# Patient Record
Sex: Female | Born: 1960
Health system: Southern US, Community
[De-identification: ages and names within clinical notes are randomized; demographics above are authoritative.]

## PROBLEM LIST (undated history)

## (undated) DIAGNOSIS — Z923 Personal history of irradiation: Secondary | ICD-10-CM

## (undated) DIAGNOSIS — Z9221 Personal history of antineoplastic chemotherapy: Secondary | ICD-10-CM

## (undated) DIAGNOSIS — I10 Essential (primary) hypertension: Secondary | ICD-10-CM

## (undated) DIAGNOSIS — F419 Anxiety disorder, unspecified: Secondary | ICD-10-CM

## (undated) DIAGNOSIS — G709 Myoneural disorder, unspecified: Secondary | ICD-10-CM

## (undated) DIAGNOSIS — D72819 Decreased white blood cell count, unspecified: Secondary | ICD-10-CM

## (undated) DIAGNOSIS — K219 Gastro-esophageal reflux disease without esophagitis: Secondary | ICD-10-CM

## (undated) DIAGNOSIS — M1990 Unspecified osteoarthritis, unspecified site: Secondary | ICD-10-CM

## (undated) DIAGNOSIS — R232 Flushing: Secondary | ICD-10-CM

## (undated) DIAGNOSIS — M797 Fibromyalgia: Secondary | ICD-10-CM

## (undated) DIAGNOSIS — G8929 Other chronic pain: Secondary | ICD-10-CM

## (undated) DIAGNOSIS — G43909 Migraine, unspecified, not intractable, without status migrainosus: Secondary | ICD-10-CM

## (undated) DIAGNOSIS — C50911 Malignant neoplasm of unspecified site of right female breast: Secondary | ICD-10-CM

## (undated) DIAGNOSIS — C50919 Malignant neoplasm of unspecified site of unspecified female breast: Secondary | ICD-10-CM

## (undated) DIAGNOSIS — IMO0002 Reserved for concepts with insufficient information to code with codable children: Secondary | ICD-10-CM

## (undated) DIAGNOSIS — M545 Low back pain: Secondary | ICD-10-CM

## (undated) DIAGNOSIS — M329 Systemic lupus erythematosus, unspecified: Secondary | ICD-10-CM

## (undated) HISTORY — DX: Low back pain: M54.5

## (undated) HISTORY — PX: WISDOM TOOTH EXTRACTION: SHX21

## (undated) HISTORY — DX: Essential (primary) hypertension: I10

## (undated) HISTORY — DX: Systemic lupus erythematosus, unspecified: M32.9

## (undated) HISTORY — DX: Other chronic pain: G89.29

## (undated) HISTORY — DX: Reserved for concepts with insufficient information to code with codable children: IMO0002

## (undated) HISTORY — DX: Unspecified osteoarthritis, unspecified site: M19.90

## (undated) HISTORY — DX: Migraine, unspecified, not intractable, without status migrainosus: G43.909

## (undated) HISTORY — DX: Flushing: R23.2

## (undated) HISTORY — DX: Decreased white blood cell count, unspecified: D72.819

## (undated) HISTORY — PX: TUBAL LIGATION: SHX77

## (undated) HISTORY — DX: Fibromyalgia: M79.7

---

## 1996-07-30 HISTORY — PX: CARPAL TUNNEL RELEASE: SHX101

## 1999-03-29 ENCOUNTER — Other Ambulatory Visit: Admission: RE | Admit: 1999-03-29 | Discharge: 1999-03-29 | Payer: Self-pay | Admitting: Obstetrics

## 2000-04-15 ENCOUNTER — Ambulatory Visit (HOSPITAL_COMMUNITY): Admission: RE | Admit: 2000-04-15 | Discharge: 2000-04-15 | Payer: Self-pay | Admitting: Family Medicine

## 2000-04-15 ENCOUNTER — Encounter: Payer: Self-pay | Admitting: Family Medicine

## 2000-10-04 ENCOUNTER — Ambulatory Visit (HOSPITAL_COMMUNITY): Admission: RE | Admit: 2000-10-04 | Discharge: 2000-10-04 | Payer: Self-pay | Admitting: Ophthalmology

## 2000-10-07 ENCOUNTER — Ambulatory Visit (HOSPITAL_COMMUNITY): Admission: RE | Admit: 2000-10-07 | Discharge: 2000-10-07 | Payer: Self-pay | Admitting: Ophthalmology

## 2000-10-24 ENCOUNTER — Other Ambulatory Visit: Admission: RE | Admit: 2000-10-24 | Discharge: 2000-10-24 | Payer: Self-pay | Admitting: Obstetrics

## 2000-10-30 ENCOUNTER — Ambulatory Visit (HOSPITAL_COMMUNITY): Admission: RE | Admit: 2000-10-30 | Discharge: 2000-10-30 | Payer: Self-pay | Admitting: Obstetrics

## 2000-10-30 ENCOUNTER — Encounter: Payer: Self-pay | Admitting: Obstetrics

## 2001-08-01 ENCOUNTER — Ambulatory Visit (HOSPITAL_BASED_OUTPATIENT_CLINIC_OR_DEPARTMENT_OTHER): Admission: RE | Admit: 2001-08-01 | Discharge: 2001-08-01 | Payer: Self-pay | Admitting: Orthopedic Surgery

## 2002-07-30 HISTORY — PX: TRIGGER FINGER RELEASE: SHX641

## 2006-03-14 ENCOUNTER — Encounter: Admission: RE | Admit: 2006-03-14 | Discharge: 2006-03-14 | Payer: Self-pay | Admitting: Family Medicine

## 2006-03-15 ENCOUNTER — Encounter: Admission: RE | Admit: 2006-03-15 | Discharge: 2006-03-15 | Payer: Self-pay | Admitting: Family Medicine

## 2008-09-29 ENCOUNTER — Ambulatory Visit: Payer: Self-pay | Admitting: Oncology

## 2008-10-07 LAB — COMPREHENSIVE METABOLIC PANEL
ALT: 12 U/L (ref 0–35)
AST: 16 U/L (ref 0–37)
Alkaline Phosphatase: 39 U/L (ref 39–117)
BUN: 10 mg/dL (ref 6–23)
Calcium: 9.2 mg/dL (ref 8.4–10.5)
Chloride: 99 mEq/L (ref 96–112)
Creatinine, Ser: 0.95 mg/dL (ref 0.40–1.20)
Total Bilirubin: 1.2 mg/dL (ref 0.3–1.2)

## 2008-10-07 LAB — CBC WITH DIFFERENTIAL/PLATELET
BASO%: 0.3 % (ref 0.0–2.0)
EOS%: 0.5 % (ref 0.0–7.0)
HCT: 37.7 % (ref 34.8–46.6)
HGB: 13.1 g/dL (ref 11.6–15.9)
MCH: 32.3 pg (ref 25.1–34.0)
MCHC: 34.9 g/dL (ref 31.5–36.0)
MONO#: 0.3 10*3/uL (ref 0.1–0.9)
NEUT%: 54.9 % (ref 38.4–76.8)
RDW: 12.4 % (ref 11.2–14.5)
WBC: 3.6 10*3/uL — ABNORMAL LOW (ref 3.9–10.3)
lymph#: 1.3 10*3/uL (ref 0.9–3.3)

## 2008-10-07 LAB — VITAMIN B12: Vitamin B-12: 449 pg/mL (ref 211–911)

## 2008-10-07 LAB — CHCC SMEAR

## 2008-10-07 LAB — MORPHOLOGY: PLT EST: ADEQUATE

## 2008-10-07 LAB — TSH: TSH: 1.189 u[IU]/mL (ref 0.350–4.500)

## 2008-10-11 ENCOUNTER — Ambulatory Visit (HOSPITAL_COMMUNITY): Admission: RE | Admit: 2008-10-11 | Discharge: 2008-10-11 | Payer: Self-pay | Admitting: Obstetrics

## 2008-10-19 ENCOUNTER — Encounter: Admission: RE | Admit: 2008-10-19 | Discharge: 2008-10-19 | Payer: Self-pay | Admitting: Obstetrics

## 2008-12-29 ENCOUNTER — Encounter: Admission: RE | Admit: 2008-12-29 | Discharge: 2008-12-29 | Payer: Self-pay | Admitting: Occupational Medicine

## 2009-01-26 ENCOUNTER — Ambulatory Visit: Payer: Self-pay | Admitting: Oncology

## 2009-02-01 LAB — CBC WITH DIFFERENTIAL/PLATELET
BASO%: 0.8 % (ref 0.0–2.0)
Eosinophils Absolute: 0.1 10*3/uL (ref 0.0–0.5)
MCHC: 36.4 g/dL — ABNORMAL HIGH (ref 31.5–36.0)
MONO#: 0.3 10*3/uL (ref 0.1–0.9)
MONO%: 6.2 % (ref 0.0–14.0)
NEUT#: 2 10*3/uL (ref 1.5–6.5)
RBC: 3.76 10*6/uL (ref 3.70–5.45)
RDW: 11.8 % (ref 11.2–14.5)
WBC: 4.4 10*3/uL (ref 3.9–10.3)

## 2009-02-01 LAB — CHCC SMEAR

## 2009-04-01 ENCOUNTER — Ambulatory Visit: Payer: Self-pay | Admitting: Oncology

## 2009-04-06 LAB — CBC WITH DIFFERENTIAL/PLATELET
BASO%: 0.4 % (ref 0.0–2.0)
EOS%: 0.4 % (ref 0.0–7.0)
MCH: 32.3 pg (ref 25.1–34.0)
MCHC: 36 g/dL (ref 31.5–36.0)
MONO%: 4.2 % (ref 0.0–14.0)
RBC: 4.09 10*6/uL (ref 3.70–5.45)
RDW: 12.4 % (ref 11.2–14.5)
lymph#: 2 10*3/uL (ref 0.9–3.3)

## 2009-04-06 LAB — CHCC SMEAR

## 2009-05-02 ENCOUNTER — Encounter
Admission: RE | Admit: 2009-05-02 | Discharge: 2009-07-20 | Payer: Self-pay | Admitting: Physical Medicine & Rehabilitation

## 2009-05-03 ENCOUNTER — Ambulatory Visit: Payer: Self-pay | Admitting: Physical Medicine & Rehabilitation

## 2009-06-03 ENCOUNTER — Ambulatory Visit: Payer: Self-pay | Admitting: Physical Medicine & Rehabilitation

## 2009-06-17 ENCOUNTER — Ambulatory Visit: Payer: Self-pay | Admitting: Physical Medicine & Rehabilitation

## 2009-06-28 ENCOUNTER — Ambulatory Visit: Payer: Self-pay | Admitting: Oncology

## 2009-06-30 LAB — CBC WITH DIFFERENTIAL/PLATELET
BASO%: 1 % (ref 0.0–2.0)
HCT: 37 % (ref 34.8–46.6)
HGB: 13.1 g/dL (ref 11.6–15.9)
MCHC: 35.4 g/dL (ref 31.5–36.0)
MONO#: 0.3 10*3/uL (ref 0.1–0.9)
NEUT%: 49.3 % (ref 38.4–76.8)
WBC: 3.7 10*3/uL — ABNORMAL LOW (ref 3.9–10.3)
lymph#: 1.5 10*3/uL (ref 0.9–3.3)

## 2009-06-30 LAB — COMPREHENSIVE METABOLIC PANEL
ALT: 14 U/L (ref 0–35)
Albumin: 4.4 g/dL (ref 3.5–5.2)
CO2: 24 mEq/L (ref 19–32)
Calcium: 9.4 mg/dL (ref 8.4–10.5)
Chloride: 101 mEq/L (ref 96–112)
Creatinine, Ser: 0.94 mg/dL (ref 0.40–1.20)
Potassium: 3.7 mEq/L (ref 3.5–5.3)
Total Protein: 7.8 g/dL (ref 6.0–8.3)

## 2009-07-27 ENCOUNTER — Encounter
Admission: RE | Admit: 2009-07-27 | Discharge: 2009-07-28 | Payer: Self-pay | Admitting: Physical Medicine & Rehabilitation

## 2009-07-28 ENCOUNTER — Ambulatory Visit: Payer: Self-pay | Admitting: Physical Medicine & Rehabilitation

## 2009-08-17 ENCOUNTER — Encounter
Admission: RE | Admit: 2009-08-17 | Discharge: 2009-11-15 | Payer: Self-pay | Admitting: Physical Medicine & Rehabilitation

## 2009-08-18 ENCOUNTER — Ambulatory Visit: Payer: Self-pay | Admitting: Physical Medicine & Rehabilitation

## 2009-09-16 ENCOUNTER — Ambulatory Visit: Payer: Self-pay | Admitting: Physical Medicine & Rehabilitation

## 2009-09-30 ENCOUNTER — Ambulatory Visit: Payer: Self-pay | Admitting: Physical Medicine & Rehabilitation

## 2009-10-31 ENCOUNTER — Ambulatory Visit: Payer: Self-pay | Admitting: Physical Medicine & Rehabilitation

## 2009-12-01 ENCOUNTER — Encounter
Admission: RE | Admit: 2009-12-01 | Discharge: 2009-12-05 | Payer: Self-pay | Admitting: Physical Medicine & Rehabilitation

## 2009-12-05 ENCOUNTER — Ambulatory Visit: Payer: Self-pay | Admitting: Physical Medicine & Rehabilitation

## 2009-12-27 ENCOUNTER — Ambulatory Visit: Payer: Self-pay | Admitting: Oncology

## 2009-12-28 LAB — CBC WITH DIFFERENTIAL/PLATELET
Basophils Absolute: 0.1 10*3/uL (ref 0.0–0.1)
Eosinophils Absolute: 0.1 10*3/uL (ref 0.0–0.5)
HGB: 13.6 g/dL (ref 11.6–15.9)
LYMPH%: 43.8 % (ref 14.0–49.7)
MCV: 92.5 fL (ref 79.5–101.0)
MONO#: 0.2 10*3/uL (ref 0.1–0.9)
MONO%: 3.2 % (ref 0.0–14.0)
NEUT#: 2.7 10*3/uL (ref 1.5–6.5)
Platelets: 342 10*3/uL (ref 145–400)
RBC: 3.97 10*6/uL (ref 3.70–5.45)
WBC: 5.5 10*3/uL (ref 3.9–10.3)

## 2009-12-28 LAB — MORPHOLOGY
PLT EST: ADEQUATE
RBC Comments: NORMAL

## 2009-12-28 LAB — CHCC SMEAR

## 2010-04-26 ENCOUNTER — Ambulatory Visit (HOSPITAL_COMMUNITY): Admission: RE | Admit: 2010-04-26 | Discharge: 2010-04-26 | Payer: Self-pay | Admitting: Obstetrics

## 2010-06-26 ENCOUNTER — Ambulatory Visit (HOSPITAL_BASED_OUTPATIENT_CLINIC_OR_DEPARTMENT_OTHER): Payer: Self-pay | Admitting: Oncology

## 2010-06-28 LAB — CBC WITH DIFFERENTIAL/PLATELET
BASO%: 0.7 % (ref 0.0–2.0)
EOS%: 1.3 % (ref 0.0–7.0)
HCT: 33.9 % — ABNORMAL LOW (ref 34.8–46.6)
HGB: 12.2 g/dL (ref 11.6–15.9)
MCH: 33.3 pg (ref 25.1–34.0)
MCHC: 36 g/dL (ref 31.5–36.0)
MONO#: 0.2 10*3/uL (ref 0.1–0.9)
RDW: 12.4 % (ref 11.2–14.5)
WBC: 3.1 10*3/uL — ABNORMAL LOW (ref 3.9–10.3)
lymph#: 1.4 10*3/uL (ref 0.9–3.3)

## 2010-06-28 LAB — COMPREHENSIVE METABOLIC PANEL
ALT: 19 U/L (ref 0–35)
AST: 25 U/L (ref 0–37)
Alkaline Phosphatase: 34 U/L — ABNORMAL LOW (ref 39–117)
BUN: 13 mg/dL (ref 6–23)
Creatinine, Ser: 1.04 mg/dL (ref 0.40–1.20)
Total Bilirubin: 0.6 mg/dL (ref 0.3–1.2)

## 2010-06-28 LAB — MORPHOLOGY: PLT EST: ADEQUATE

## 2010-06-28 LAB — CHCC SMEAR

## 2010-08-20 ENCOUNTER — Encounter: Payer: Self-pay | Admitting: Obstetrics

## 2010-08-23 ENCOUNTER — Encounter
Admission: RE | Admit: 2010-08-23 | Discharge: 2010-08-25 | Payer: Self-pay | Source: Home / Self Care | Attending: Physical Medicine & Rehabilitation | Admitting: Physical Medicine & Rehabilitation

## 2010-08-25 ENCOUNTER — Ambulatory Visit: Admit: 2010-08-25 | Payer: Self-pay | Admitting: Physical Medicine & Rehabilitation

## 2010-09-25 ENCOUNTER — Encounter (HOSPITAL_BASED_OUTPATIENT_CLINIC_OR_DEPARTMENT_OTHER): Payer: BC Managed Care – PPO | Admitting: Oncology

## 2010-09-25 DIAGNOSIS — Z87891 Personal history of nicotine dependence: Secondary | ICD-10-CM

## 2010-09-25 DIAGNOSIS — M1990 Unspecified osteoarthritis, unspecified site: Secondary | ICD-10-CM

## 2010-09-25 DIAGNOSIS — I10 Essential (primary) hypertension: Secondary | ICD-10-CM

## 2010-09-25 DIAGNOSIS — D72819 Decreased white blood cell count, unspecified: Secondary | ICD-10-CM

## 2010-09-25 LAB — CBC WITH DIFFERENTIAL/PLATELET
Basophils Absolute: 0 10*3/uL (ref 0.0–0.1)
EOS%: 0.9 % (ref 0.0–7.0)
HCT: 34.6 % — ABNORMAL LOW (ref 34.8–46.6)
HGB: 12.4 g/dL (ref 11.6–15.9)
LYMPH%: 40.4 % (ref 14.0–49.7)
MCH: 32 pg (ref 25.1–34.0)
MCV: 89.4 fL (ref 79.5–101.0)
MONO%: 6 % (ref 0.0–14.0)
NEUT%: 52.3 % (ref 38.4–76.8)
Platelets: 300 10*3/uL (ref 145–400)
lymph#: 2.3 10*3/uL (ref 0.9–3.3)

## 2010-10-11 ENCOUNTER — Emergency Department (HOSPITAL_COMMUNITY): Payer: BC Managed Care – PPO

## 2010-10-11 ENCOUNTER — Emergency Department (HOSPITAL_COMMUNITY)
Admission: EM | Admit: 2010-10-11 | Discharge: 2010-10-11 | Disposition: A | Discharge status: Home / Self Care | Payer: BC Managed Care – PPO | Attending: Emergency Medicine | Admitting: Emergency Medicine

## 2010-10-11 DIAGNOSIS — I10 Essential (primary) hypertension: Secondary | ICD-10-CM | POA: Insufficient documentation

## 2010-10-11 DIAGNOSIS — R079 Chest pain, unspecified: Secondary | ICD-10-CM | POA: Insufficient documentation

## 2010-10-11 DIAGNOSIS — J40 Bronchitis, not specified as acute or chronic: Secondary | ICD-10-CM | POA: Insufficient documentation

## 2010-10-11 DIAGNOSIS — R05 Cough: Secondary | ICD-10-CM | POA: Insufficient documentation

## 2010-10-11 DIAGNOSIS — R062 Wheezing: Secondary | ICD-10-CM | POA: Insufficient documentation

## 2010-10-11 DIAGNOSIS — R059 Cough, unspecified: Secondary | ICD-10-CM | POA: Insufficient documentation

## 2010-10-11 LAB — POCT I-STAT, CHEM 8
BUN: 11 mg/dL (ref 6–23)
Calcium, Ion: 1.15 mmol/L (ref 1.12–1.32)
Chloride: 102 meq/L (ref 96–112)
Creatinine, Ser: 1.1 mg/dL (ref 0.4–1.2)
Glucose, Bld: 105 mg/dL — ABNORMAL HIGH (ref 70–99)
HCT: 37 % (ref 36.0–46.0)
Hemoglobin: 12.6 g/dL (ref 12.0–15.0)
Potassium: 3.9 mEq/L (ref 3.5–5.1)
Sodium: 138 meq/L (ref 135–145)
TCO2: 25 mmol/L (ref 0–100)

## 2010-10-11 LAB — D-DIMER, QUANTITATIVE: D-Dimer, Quant: <0.22 ug{FEU}/mL (ref 0.00–0.48)

## 2010-12-15 NOTE — Op Note (Signed)
[facility]  Patient:    Tina [name], WHISTLER Visit Number: [id number] MRN: [id number]          Service Type: DSU Location: [facility] Attending Physician:  Ronne Binning Dictated by:   Nicki Reaper, M.D. Proc. Date: 08/01/01 Admit Date:  08/01/2001                             Operative Report  PREOPERATIVE DIAGNOSIS:  Stenosing tenosynovitis right thumb.  POSTOPERATIVE DIAGNOSIS:  Stenosing tenosynovitis right thumb.  OPERATION:  Release A1 pulley right thumb.  SURGEON:  Nicki Reaper, M.D.  ASSISTANT:  R.N.  ANESTHESIA:  Forearm-based IV regional.  ANESTHESIOLOGIST:  Guadalupe Maple, M.D.  INDICATIONS:  The patient is a 50 year old female with a history of triggering of her right thumb, not responsive to conservative treatment.  DESCRIPTION OF PROCEDURE:  The patient was brought to the operating room where a forearm-based IV regional anesthetic was carried out without difficulty. She was prepped and draped using Betadine scrubbing solution with the right arm free.  Transverse incision was made over the A1 pulley, carried down through subcutaneous tissue.  Bleeders were electrocauterized, the neurovascular structures protected.  The A1 pulley was released in its radial aspect.  No further lesions were identified.  The wound was irrigated.  The skin was closed with interrupted 5-0 nylon sutures.  A sterile compressive dressing was applied.  The patient tolerated the procedure well and was taken to the recovery room for observation in satisfactory condition.  She is discharged home to return to the [facility] [place] in one week, on Vicodin and Keflex.Dictated by:   Nicki Reaper, M.D. Attending Physician:  Ronne Binning DD:  08/01/01 TD:  08/01/01 Job: [id number]

## 2010-12-20 ENCOUNTER — Other Ambulatory Visit: Payer: Self-pay | Admitting: Oncology

## 2010-12-20 ENCOUNTER — Encounter (HOSPITAL_BASED_OUTPATIENT_CLINIC_OR_DEPARTMENT_OTHER): Payer: BC Managed Care – PPO | Admitting: Oncology

## 2010-12-20 DIAGNOSIS — D72819 Decreased white blood cell count, unspecified: Secondary | ICD-10-CM

## 2010-12-20 DIAGNOSIS — M1990 Unspecified osteoarthritis, unspecified site: Secondary | ICD-10-CM

## 2010-12-20 DIAGNOSIS — I10 Essential (primary) hypertension: Secondary | ICD-10-CM

## 2010-12-20 DIAGNOSIS — Z87891 Personal history of nicotine dependence: Secondary | ICD-10-CM

## 2010-12-20 DIAGNOSIS — D709 Neutropenia, unspecified: Secondary | ICD-10-CM

## 2010-12-20 LAB — CBC WITH DIFFERENTIAL/PLATELET
Basophils Absolute: 0 10*3/uL (ref 0.0–0.1)
Eosinophils Absolute: 0 10*3/uL (ref 0.0–0.5)
HGB: 12.9 g/dL (ref 11.6–15.9)
LYMPH%: 40 % (ref 14.0–49.7)
MCH: 32.8 pg (ref 25.1–34.0)
MCV: 93.1 fL (ref 79.5–101.0)
MONO%: 3.8 % (ref 0.0–14.0)
NEUT#: 2 10*3/uL (ref 1.5–6.5)
Platelets: 246 10*3/uL (ref 145–400)
RBC: 3.94 10*6/uL (ref 3.70–5.45)

## 2010-12-20 LAB — COMPREHENSIVE METABOLIC PANEL
Albumin: 4.5 g/dL (ref 3.5–5.2)
CO2: 24 mEq/L (ref 19–32)
Calcium: 10 mg/dL (ref 8.4–10.5)
Glucose, Bld: 93 mg/dL (ref 70–99)
Potassium: 3.8 mEq/L (ref 3.5–5.3)
Sodium: 134 mEq/L — ABNORMAL LOW (ref 135–145)
Total Protein: 7.7 g/dL (ref 6.0–8.3)

## 2011-02-16 ENCOUNTER — Encounter: Payer: Worker's Compensation | Attending: Neurosurgery | Admitting: Neurosurgery

## 2011-02-16 ENCOUNTER — Ambulatory Visit: Payer: Self-pay | Admitting: Physical Medicine & Rehabilitation

## 2011-02-16 DIAGNOSIS — IMO0001 Reserved for inherently not codable concepts without codable children: Secondary | ICD-10-CM | POA: Insufficient documentation

## 2011-02-16 DIAGNOSIS — M545 Low back pain, unspecified: Secondary | ICD-10-CM | POA: Insufficient documentation

## 2011-02-16 DIAGNOSIS — R209 Unspecified disturbances of skin sensation: Secondary | ICD-10-CM | POA: Insufficient documentation

## 2011-02-16 DIAGNOSIS — M5430 Sciatica, unspecified side: Secondary | ICD-10-CM

## 2011-02-17 NOTE — Assessment & Plan Note (Signed)
Account [id number].  [name] is a workers' comp patient seen for low back pain.  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  Dr. Wynn Banker is going to make a determination on return to work based on that in January.  She states she still has not gone back to work. Original injury date was April 2010.  She has written a letter with concerns that she wants Dr. Wynn Banker to address.  She states she does wanted to go back to work and I offered to release her back to work without restriction today and then she declined.  She rates her pain as 6 or 7, it is a sharp to dull stabbing pain with paresthesias.  She does note that her right foot "jerks."  General activity level is 7 to 9. Pain is worse in the evening and at night.  Sleep patterns are poor. Bending, sitting most activities aggravate.  Rest, heat therapy, and medication tend to help.  She can walk about 15-20 minutes.  She drives, climbs steps.  She is out of work.  REVIEW OF SYSTEMS:  Notable for those difficulties as well as some night sweats, respiratory infection, sleep apnea, numbness, tingling, trouble with spasms, depression, anxiety, no suicidal thoughts, or aberrant behaviors.  PAST MEDICAL HISTORY:  Unchanged.  SOCIAL HISTORY:  Married, lives with her husband.  FAMILY HISTORY:  Unchanged.  PHYSICAL EXAMINATION:  VITAL SIGNS:  Blood pressure 160/100, pulse 99, respirations 18, O2 sats 99 on room air.  There has been no change in her condition.  Her motor strength is good in lower extremities.  Constitutionally is within normal limits.  She is alert and oriented x3.  She has normal gait.  Sensation is intact.  IMPRESSION:  Lumbar pain.  Chronic back pain, myofascial pain.  PLAN: 1. Refill Flexeril one p.o. nightly #30 with 5 refills. 2. Ultracet one p.o. t.i.d. #90 with 5 refills. 3. She will follow up with Dr. Wynn Banker in 6 months at the maximum.     I am going to provide a letter  that she wrote to him with the chart     and he can make a determination on her work status.     Tina [name] Electronically Signed    RLW/MedQ D:  02/16/2011 13:07:56  T:  02/17/2011 00:58:12  Job #:  [id number]

## 2011-02-19 ENCOUNTER — Ambulatory Visit: Payer: Self-pay | Admitting: Physical Medicine & Rehabilitation

## 2011-03-19 ENCOUNTER — Encounter (HOSPITAL_BASED_OUTPATIENT_CLINIC_OR_DEPARTMENT_OTHER): Payer: BC Managed Care – PPO | Admitting: Oncology

## 2011-03-19 ENCOUNTER — Other Ambulatory Visit: Payer: Self-pay | Admitting: Oncology

## 2011-03-19 DIAGNOSIS — D72819 Decreased white blood cell count, unspecified: Secondary | ICD-10-CM

## 2011-03-19 DIAGNOSIS — Z87891 Personal history of nicotine dependence: Secondary | ICD-10-CM

## 2011-03-19 DIAGNOSIS — M1990 Unspecified osteoarthritis, unspecified site: Secondary | ICD-10-CM

## 2011-03-19 DIAGNOSIS — I10 Essential (primary) hypertension: Secondary | ICD-10-CM

## 2011-03-19 LAB — CBC WITH DIFFERENTIAL/PLATELET
Eosinophils Absolute: 0 10*3/uL (ref 0.0–0.5)
MONO#: 0.2 10*3/uL (ref 0.1–0.9)
MONO%: 6.1 % (ref 0.0–14.0)
NEUT#: 2 10*3/uL (ref 1.5–6.5)
RBC: 3.83 10*6/uL (ref 3.70–5.45)
RDW: 11.8 % (ref 11.2–14.5)
WBC: 3.8 10*3/uL — ABNORMAL LOW (ref 3.9–10.3)
nRBC: 0 % (ref 0–0)

## 2011-04-13 ENCOUNTER — Ambulatory Visit: Payer: Worker's Compensation | Admitting: Physical Medicine & Rehabilitation

## 2011-04-13 ENCOUNTER — Encounter: Payer: Worker's Compensation | Attending: Neurosurgery

## 2011-04-13 DIAGNOSIS — M79609 Pain in unspecified limb: Secondary | ICD-10-CM

## 2011-04-13 DIAGNOSIS — M545 Low back pain, unspecified: Secondary | ICD-10-CM | POA: Insufficient documentation

## 2011-04-13 DIAGNOSIS — M76899 Other specified enthesopathies of unspecified lower limb, excluding foot: Secondary | ICD-10-CM

## 2011-04-13 DIAGNOSIS — IMO0001 Reserved for inherently not codable concepts without codable children: Secondary | ICD-10-CM | POA: Insufficient documentation

## 2011-04-13 DIAGNOSIS — R209 Unspecified disturbances of skin sensation: Secondary | ICD-10-CM | POA: Insufficient documentation

## 2011-04-13 NOTE — Assessment & Plan Note (Signed)
A 50 year old female with a date of injury per work-related incident November 12, 2008.  She was at work at a table that she was leaning upon when it shifted.  The leg of the table fell into a hole in a grating and the patient leaned back to avoid falling forward.  She has had back pain since that time.  She did not actually fall herself.  She was initially treated with ibuprofen, she went to [facility].  She had some prednisone, treated with Mobic, methocarbamol, placed on light duty, working half days initially.  MRI lumbar spine on February 11, 2009, demonstrated small disk protrusion L5-S1.  No neural impingement, went through physical therapy.  __________ was maintained at all other levels, some facet arthropathy noted at L5-S1 bilaterally.  She saw me initial consultation on May 03, 2009.  We did medial branch blocks which were not helpful.  She was treated with nonnarcotic medications, SI joint injection was advised.  She elected not to try this.  She reached MMI on July 28, 2009, after [employer].  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  She had previously stopped physical therapy early on because she states that it made her hurt worse.  She progressed with PT up to 50-pound lifting, went through another FCE, got up to floor-to-waist 55-pound lifting, carrying of 55 pounds, push-pull 50-pound, 8-hour a day, 5 days a week. In April, she states she could not go back because her work required a 70-pound lifting restriction.  She has been seen intermittently because she has been on some nonnarcotic pain medicine such as Flexeril and Ultracet.  She uses Flexeril at night and Ultracet during the day.  She states over the last couple of months, she has developed some increasing right lower extremity pain.  She has had no bowel or bladder dysfunction.  Her pain is worse with walking, bending, and standing; improves with rest and  heat, exercise, and medications.  She can walk 10- 15 minutes at a time.  She drives.  She states she does not climb steps. She indicates she needs some help with meal prep, household duties, and shopping.  REVIEW OF SYSTEMS:  Weakness, numbness, tingling, trouble walking, spasms, depression, anxiety, suicidal thoughts, either more passive thoughts, no active plan, just states that she is frustrated and tired because of her pain, nausea, vomiting, sleep apnea on review of systems.  PHYSICAL EXAMINATION:  VITAL SIGNS:  Blood pressure 160/94, pulse 104, respirations 16, O2 sat 98% on room air. GENERAL:  This is an anxious-appearing woman who cries easily.  She moves slowly around the room when asked to walk or get on and off the exam table.  Her Oswestry score is 82% today compared to 70% last visit, was at 44% in May of 2011. MUSCULOSKELETAL:  She has normal strength in the upper and lower extremities, normal deep tendon reflexes in the lower extremities. Negative straight leg raising test.  To pinprick, she reports decreased sensation in the L3, L4, L5, and S1 dermatomal distribution compared to the left side. BACK:  Has some tenderness over the PSIS as well as lumbar paraspinal area also over the gluteus medius area on the right side.  EXTREMITIES: Show no evidence of edema.  Her gait shows no evidence of toe drag or knee instability, but she does walk slowly.  IMPRESSION:  Myofascial pain syndrome, lumbar spine.  She may also have some sacroiliac disorder.  I see no clear-cut signs of radiculopathy certainly the small disk protrusion that was seen on the MRI would not produce her sensory complaints.  She has not returned to work with the restrictions provided by the [employer].  The patient states that she has been asked to go back to work and I have indicated there is no new finding for me to prevent her from doing so and we would stick by the original FCE.  She indicates she would  like to have another opinion on this and I have taken liberty to make a referral over the [facility] to see if one of the neurosurgeons could evaluate her to see if there is any other explanation for her current complaints.  We will continue her on her Ultracet during the day and Flexeril at night.     Erick Colace, M.D. Electronically Signed    AEK/MedQ D:  04/13/2011 15:00:42  T:  04/13/2011 21:23:55  Job #:  [id number]  cc:   [facility]

## 2011-06-01 ENCOUNTER — Encounter: Payer: Self-pay | Admitting: Oncology

## 2011-06-01 DIAGNOSIS — I10 Essential (primary) hypertension: Secondary | ICD-10-CM | POA: Insufficient documentation

## 2011-06-13 ENCOUNTER — Other Ambulatory Visit: Payer: Self-pay | Admitting: Oncology

## 2011-06-13 ENCOUNTER — Other Ambulatory Visit (HOSPITAL_BASED_OUTPATIENT_CLINIC_OR_DEPARTMENT_OTHER): Payer: Worker's Compensation | Admitting: Lab

## 2011-06-13 ENCOUNTER — Telehealth: Payer: Self-pay | Admitting: Oncology

## 2011-06-13 ENCOUNTER — Ambulatory Visit (HOSPITAL_BASED_OUTPATIENT_CLINIC_OR_DEPARTMENT_OTHER): Payer: Worker's Compensation | Admitting: Oncology

## 2011-06-13 DIAGNOSIS — Z87891 Personal history of nicotine dependence: Secondary | ICD-10-CM

## 2011-06-13 DIAGNOSIS — M1990 Unspecified osteoarthritis, unspecified site: Secondary | ICD-10-CM

## 2011-06-13 DIAGNOSIS — I10 Essential (primary) hypertension: Secondary | ICD-10-CM

## 2011-06-13 DIAGNOSIS — D72819 Decreased white blood cell count, unspecified: Secondary | ICD-10-CM

## 2011-06-13 LAB — COMPREHENSIVE METABOLIC PANEL
ALT: 16 U/L (ref 0–35)
AST: 20 U/L (ref 0–37)
CO2: 24 mEq/L (ref 19–32)
Creatinine, Ser: 0.9 mg/dL (ref 0.50–1.10)
Sodium: 135 mEq/L (ref 135–145)
Total Bilirubin: 0.8 mg/dL (ref 0.3–1.2)
Total Protein: 7.5 g/dL (ref 6.0–8.3)

## 2011-06-13 LAB — CBC WITH DIFFERENTIAL/PLATELET
BASO%: 0.8 % (ref 0.0–2.0)
Basophils Absolute: 0 10*3/uL (ref 0.0–0.1)
EOS%: 1.1 % (ref 0.0–7.0)
HGB: 12.9 g/dL (ref 11.6–15.9)
MCH: 33.1 pg (ref 25.1–34.0)
MCHC: 35.6 g/dL (ref 31.5–36.0)
MCV: 93 fL (ref 79.5–101.0)
MONO%: 6.4 % (ref 0.0–14.0)
NEUT%: 51.5 % (ref 38.4–76.8)
RDW: 12.5 % (ref 11.2–14.5)
lymph#: 1.6 10*3/uL (ref 0.9–3.3)

## 2011-06-13 LAB — MORPHOLOGY: PLT EST: ADEQUATE

## 2011-06-13 LAB — CHCC SMEAR

## 2011-06-13 NOTE — Progress Notes (Signed)
[facility] OFFICE PROGRESS NOTE  CC:  Tina [name], M.D.  DIAGNOSIS:  leucopenia; NOS.  TREATMENT:  Watchful observation.  INTERVAL HISTORY: Tina [name] 50 y.o. female returns for regular follow.  She is feeling well. She has mild fatigue is been on for many years. She is still able to take a mother and her grandmother who is 67 years old at home. She denies any recurrent infection such as  pneumonia with productive cough, chest pain, abdominal pain, diarrhea, hematuria, year depression, much infection, skin rash, cellulitis, abscess, recurrent lymph node swelling.  She has not had recurrent sinus infection for a while.   MEDICAL HISTORY: Past Medical History  Diagnosis Date  . Leukocytopenia   . Hypertension   . Degenerative joint disease     ALLERGIES:   has no known allergies.  MEDICATIONS: Current Outpatient Prescriptions  Medication Sig Dispense Refill  . Ascorbic Acid (VITAMIN C) 1000 MG tablet Take 1,000 mg by mouth daily.        Marland Kitchen ibuprofen (ADVIL,MOTRIN) 200 MG tablet Take 200 mg by mouth as needed.        . Multiple Vitamin (MULTIVITAMIN) tablet Take 1 tablet by mouth daily.        Marland Kitchen olmesartan (BENICAR) 40 MG tablet Take 40 mg by mouth daily.        . Vitamin D, Ergocalciferol, (DRISDOL) 50000 UNITS CAPS Take 1,000 Units by mouth daily.         SURGICAL HISTORY: No past surgical history on file.  REVIEW OF SYSTEMS:  Pertinent items are noted in HPI.   Filed Vitals:   06/13/11 1354  BP: 155/101  Pulse: 86  Temp: 98.5 F (36.9 C)   Wt Readings from Last 3 Encounters:  06/13/11 189 lb 11.2 oz (86.047 kg)  12/20/10 189 lb 6 oz (85.9 kg)    PHYSICAL EXAMINATION:  General:  well-nourished in no acute distress.  Eyes:  no scleral icterus.  ENT:  There were no oropharyngeal lesions.  Neck was without thyromegaly.  Lymphatics:  Negative cervical, supraclavicular or axillary adenopathy.  Respiratory: lungs were clear bilaterally without  wheezing or crackles.  Cardiovascular:  Regular rate and rhythm, S1/S2, without murmur, rub or gallop.  There was no pedal edema.  GI:  abdomen was soft, flat, nontender, nondistended, without organomegaly.  Muscoloskeletal:  no spinal tenderness of palpation of vertebral spine.  Skin exam was without echymosis, petichae.  Neuro exam was nonfocal.  Patient was able to get on and off exam table without assistance.  Gait was normal.  Patient was alerted and oriented.  Attention was good.   Language was appropriate.  Mood was normal without depression.  Speech was not pressured.  Thought content was not tangential.    LABORATORY/RADIOLOGY DATA:  Lab Results  Component Value Date   WBC 4.0 06/13/2011   HGB 12.9 06/13/2011   HCT 36.3 06/13/2011   PLT 282 06/13/2011   GLUCOSE 93 12/20/2010   GLUCOSE 93 12/20/2010   ALT 14 12/20/2010   ALT 14 12/20/2010   AST 21 12/20/2010   AST 21 12/20/2010   NA 134* 12/20/2010   NA 134* 12/20/2010   K 3.8 12/20/2010   K 3.8 12/20/2010   CL 100 12/20/2010   CL 100 12/20/2010   CREATININE 0.92 12/20/2010   CREATININE 0.92 12/20/2010   BUN 11 12/20/2010   BUN 11 12/20/2010   CO2 24 12/20/2010   CO2 24 12/20/2010   TSH 1.189 10/07/2008  ASSESSMENT AND PLAN:   1. Intermittent leukopenia and neutropenia:  I discussed with Ms Tina [name] that it is most likely this is a benign condition.  Today, her WBC and ANC are normal.   I discussed with her again that I have a very low clinical concern for a primary bone marrow process such as leukemia or lymphoma, aplasia, as other cell lines are within normal range and she has no B symptoms.  In the future, if she has recurrent neutropenia less than 1 or pancytopenia with B symptoms, I may consider a diagnostic bone marrow biopsy.   2. Recurrent sinusitis:  Has not happened for a while.  3. Primary care:  She will have routine screen mammogram this month with the breast Center. She'll receive a flu shot this year. She is up-to-date with  her screening Pap smear under this year. She is due to have a routine screen colonoscopy in about 2 years when she turns 52. 4. Follow up:  Lab only in 4 months and 8 months. I will see her myself in one year. I advised her to contact us if she has problem such as recurrent infection or B-symptoms.

## 2011-06-13 NOTE — Telephone Encounter (Signed)
gve the pt her march,july,nov 2013 appt calendars.

## 2011-08-21 ENCOUNTER — Ambulatory Visit: Payer: Self-pay | Admitting: Physical Medicine & Rehabilitation

## 2011-10-08 ENCOUNTER — Other Ambulatory Visit (HOSPITAL_BASED_OUTPATIENT_CLINIC_OR_DEPARTMENT_OTHER): Payer: Self-pay | Admitting: Lab

## 2011-10-08 DIAGNOSIS — D72819 Decreased white blood cell count, unspecified: Secondary | ICD-10-CM

## 2011-10-08 LAB — CBC WITH DIFFERENTIAL/PLATELET
BASO%: 0.8 % (ref 0.0–2.0)
EOS%: 0.8 % (ref 0.0–7.0)
HCT: 39.5 % (ref 34.8–46.6)
LYMPH%: 48.4 % (ref 14.0–49.7)
MCH: 32.1 pg (ref 25.1–34.0)
MCHC: 34.6 g/dL (ref 31.5–36.0)
NEUT%: 42.2 % (ref 38.4–76.8)
Platelets: 268 10*3/uL (ref 145–400)

## 2011-10-10 ENCOUNTER — Telehealth: Payer: Self-pay | Admitting: *Deleted

## 2011-10-10 NOTE — Telephone Encounter (Signed)
Message copied by Wende Mott on Wed Oct 10, 2011 10:36 AM ------      Message from: Jethro Bolus T      Created: Tue Oct 09, 2011  4:06 PM       Please call pt.  Her WBC and neutrophil are slightly lower than before, but again, no anemia or low plt.  I recommend again watchful observation.  In the future, if counts are much worse, we may consider bone marrow biopsy at that time.  Thanks.

## 2011-10-10 NOTE — Telephone Encounter (Signed)
Called pt and informed her of Dr. Lodema Pilot message r/t to her CBC.  Pt verbalized understanding and plans to keep appt for labs again in 4 months.

## 2011-10-29 ENCOUNTER — Other Ambulatory Visit: Payer: Self-pay | Admitting: Physical Medicine & Rehabilitation

## 2012-02-18 ENCOUNTER — Other Ambulatory Visit (HOSPITAL_BASED_OUTPATIENT_CLINIC_OR_DEPARTMENT_OTHER): Payer: Self-pay | Admitting: Lab

## 2012-02-18 ENCOUNTER — Telehealth: Payer: Self-pay | Admitting: *Deleted

## 2012-02-18 DIAGNOSIS — D72819 Decreased white blood cell count, unspecified: Secondary | ICD-10-CM

## 2012-02-18 LAB — CBC WITH DIFFERENTIAL/PLATELET
BASO%: 0.4 % (ref 0.0–2.0)
EOS%: 0.7 % (ref 0.0–7.0)
MCH: 32 pg (ref 25.1–34.0)
MCHC: 35.7 g/dL (ref 31.5–36.0)
RDW: 12.3 % (ref 11.2–14.5)
lymph#: 1.9 10*3/uL (ref 0.9–3.3)

## 2012-02-18 NOTE — Telephone Encounter (Signed)
Spoke w/ pt in lobby and gave results of CBC, wnl and instructed to return for continued observation next appt in November.  Pt verbalized understanding and denies any questions, new concerns.

## 2012-02-18 NOTE — Telephone Encounter (Signed)
Message copied by Wende Mott on Mon Feb 18, 2012  2:39 PM ------      Message from: HA, Raliegh Ip T      Created: Mon Feb 18, 2012  2:31 PM       Please talk to pt (she's waiting in the waiting room).  Her leukopenia has resolved. I recommend to continue observation.  Thanks.

## 2012-06-16 ENCOUNTER — Telehealth: Payer: Self-pay | Admitting: Oncology

## 2012-06-16 ENCOUNTER — Other Ambulatory Visit (HOSPITAL_COMMUNITY): Payer: Self-pay | Admitting: Obstetrics

## 2012-06-16 ENCOUNTER — Other Ambulatory Visit (HOSPITAL_BASED_OUTPATIENT_CLINIC_OR_DEPARTMENT_OTHER): Payer: Self-pay | Admitting: Lab

## 2012-06-16 ENCOUNTER — Ambulatory Visit (HOSPITAL_BASED_OUTPATIENT_CLINIC_OR_DEPARTMENT_OTHER): Payer: Worker's Compensation | Admitting: Oncology

## 2012-06-16 VITALS — BP 131/91 | HR 82 | Temp 98.4°F | Resp 20 | Ht 64.0 in | Wt 187.1 lb

## 2012-06-16 DIAGNOSIS — D72819 Decreased white blood cell count, unspecified: Secondary | ICD-10-CM

## 2012-06-16 DIAGNOSIS — D709 Neutropenia, unspecified: Secondary | ICD-10-CM

## 2012-06-16 DIAGNOSIS — Z23 Encounter for immunization: Secondary | ICD-10-CM

## 2012-06-16 LAB — COMPREHENSIVE METABOLIC PANEL (CC13)
AST: 25 U/L (ref 5–34)
Albumin: 3.9 g/dL (ref 3.5–5.0)
BUN: 11 mg/dL (ref 7.0–26.0)
Calcium: 9.8 mg/dL (ref 8.4–10.4)
Chloride: 104 mEq/L (ref 98–107)
Potassium: 4.1 mEq/L (ref 3.5–5.1)
Sodium: 139 mEq/L (ref 136–145)
Total Protein: 8 g/dL (ref 6.4–8.3)

## 2012-06-16 LAB — CBC WITH DIFFERENTIAL/PLATELET
Basophils Absolute: 0 10*3/uL (ref 0.0–0.1)
EOS%: 0.5 % (ref 0.0–7.0)
Eosinophils Absolute: 0 10*3/uL (ref 0.0–0.5)
HGB: 13.5 g/dL (ref 11.6–15.9)
MCH: 33 pg (ref 25.1–34.0)
NEUT#: 1.9 10*3/uL (ref 1.5–6.5)
RDW: 12.9 % (ref 11.2–14.5)
lymph#: 1.7 10*3/uL (ref 0.9–3.3)

## 2012-06-16 MED ORDER — INFLUENZA VIRUS VACC SPLIT PF IM SUSP
0.5000 mL | Freq: Once | INTRAMUSCULAR | Status: AC
  Administered 2012-06-16: 0.5 mL via INTRAMUSCULAR
  Filled 2012-06-16: qty 0.5

## 2012-06-16 NOTE — Telephone Encounter (Signed)
appts made and printed for pt aom °

## 2012-06-16 NOTE — Patient Instructions (Addendum)
1.  Problem:  Leukopenia (Low white blood cell count). 2.  Status:  Resolved at this time.   3.  Potential cause:  Most likely normal variation.  I have low suspicion for primary bone marrow failure, or leukemia given stability and chronicity of the problem.  4.  Follow up:  Lab only appointment in about 6 months.  Return visit in about 1 year.  Next year, if blood counts are stable, we may consider discharging patient to follow up with PCP alone.

## 2012-06-16 NOTE — Progress Notes (Signed)
[facility]  [phone] [phone]   OFFICE PROGRESS NOTE   DIAGNOSIS: leucopenia; NOS.   TREATMENT: Watchful observation.  INTERVAL HISTORY: Tina [name] 51 y.o. female returns for regular follow.  She reports feeling well.  She denied fever, recurrent infection, adenopathy, night sweat.  She still takes care of her mother and grandmother.    Patient denies fever, anorexia, weight loss, fatigue, headache, visual changes, confusion, drenching night sweats, palpable lymph node swelling, mucositis, odynophagia, dysphagia, nausea vomiting, jaundice, chest pain, palpitation, shortness of breath, dyspnea on exertion, productive cough, gum bleeding, epistaxis, hematemesis, hemoptysis, abdominal pain, abdominal swelling, early satiety, melena, hematochezia, hematuria, skin rash, spontaneous bleeding, joint swelling, joint pain, heat or cold intolerance, bowel bladder incontinence, back pain, focal motor weakness, paresthesia, depression, suicidal or homicidal ideation, feeling hopelessness.   Past Medical History  Diagnosis Date  . Leukocytopenia   . Hypertension   . Degenerative joint disease     No past surgical history on file.  Current Outpatient Prescriptions  Medication Sig Dispense Refill  . Ascorbic Acid (VITAMIN C) 1000 MG tablet Take 1,000 mg by mouth daily.        Marland Kitchen ibuprofen (ADVIL,MOTRIN) 200 MG tablet Take 200 mg by mouth as needed.        Marland Kitchen lisinopril-hydrochlorothiazide (PRINZIDE,ZESTORETIC) 20-12.5 MG per tablet Take 1 tablet by mouth 2 (two) times daily.      . Multiple Vitamin (MULTIVITAMIN) tablet Take 1 tablet by mouth daily.        . Vitamin D, Ergocalciferol, (DRISDOL) 50000 UNITS CAPS Take 1,000 Units by mouth daily.        Current Facility-Administered Medications  Medication Dose Route Frequency Provider Last Rate Last Dose  . [COMPLETED] influenza  inactive virus vaccine (FLUZONE/FLUARIX) injection 0.5 mL  0.5 mL  Intramuscular Once Exie Parody, MD   0.5 mL at 06/16/12 1413    ALLERGIES:   has no known allergies.  REVIEW OF SYSTEMS:  The rest of the 14-point review of system was negative.   Filed Vitals:   06/16/12 1331  BP: 131/91  Pulse: 82  Temp: 98.4 F (36.9 C)  Resp: 20   Wt Readings from Last 3 Encounters:  06/16/12 187 lb 1.6 oz (84.868 kg)  06/13/11 189 lb 11.2 oz (86.047 kg)  12/20/10 189 lb 6 oz (85.9 kg)   ECOG Performance status: 0  PHYSICAL EXAMINATION:   General:  well-nourished woman, in no acute distress.  Eyes:  no scleral icterus.  ENT:  There were no oropharyngeal lesions.  Neck was without thyromegaly.  Lymphatics:  Negative cervical, supraclavicular or axillary adenopathy.  Respiratory: lungs were clear bilaterally without wheezing or crackles.  Cardiovascular:  Regular rate and rhythm, S1/S2, without murmur, rub or gallop.  There was no pedal edema.  GI:  abdomen was soft, flat, nontender, nondistended, without organomegaly.  Muscoloskeletal:  no spinal tenderness of palpation of vertebral spine.  Skin exam was without echymosis, petichae.  Neuro exam was nonfocal.  Patient was able to get on and off exam table without assistance.  Gait was normal.  Patient was alerted and oriented.  Attention was good.   Language was appropriate.  Mood was normal without depression.  Speech was not pressured.  Thought content was not tangential.      LABORATORY/RADIOLOGY DATA:  Lab Results  Component Value Date   WBC 3.9 06/16/2012   HGB 13.5 06/16/2012   HCT 38.1 06/16/2012   PLT 269 06/16/2012  GLUCOSE 85 06/16/2012   ALKPHOS 45 06/16/2012   ALT 25 06/16/2012   AST 25 06/16/2012   NA 139 06/16/2012   K 4.1 06/16/2012   CL 104 06/16/2012   CREATININE 0.9 06/16/2012   BUN 11.0 06/16/2012   CO2 30* 06/16/2012    ASSESSMENT AND PLAN:   1. Intermittent leukopenia and neutropenia:  - Status:  Resolved at this time.   - Potential cause:  Most likely normal variation.  I  have low suspicion for primary bone marrow failure, or leukemia given stability and chronicity of the problem.  -Follow up:  Lab only appointment in about 6 months.  Return visit in about 1 year.  Next year, if blood counts are stable, we may consider discharging patient to follow up with PCP alone.  2. Primary care: She will have routine screen mammogram this month with the breast Center. She asked for a flu shot.  She takes care of her mother and grandmother making her high risk.  She received her flu shot today.     The length of time of the face-to-face encounter was 10 minutes. More than 50% of time was spent counseling and coordination of care.

## 2012-06-23 ENCOUNTER — Other Ambulatory Visit (HOSPITAL_COMMUNITY): Payer: Self-pay | Admitting: Obstetrics

## 2012-06-23 DIAGNOSIS — Z1231 Encounter for screening mammogram for malignant neoplasm of breast: Secondary | ICD-10-CM

## 2012-06-25 ENCOUNTER — Ambulatory Visit (HOSPITAL_COMMUNITY)
Admission: RE | Admit: 2012-06-25 | Discharge: 2012-06-25 | Disposition: A | Discharge status: Home / Self Care | Payer: Self-pay | Source: Ambulatory Visit | Attending: Obstetrics | Admitting: Obstetrics

## 2012-06-25 DIAGNOSIS — Z1231 Encounter for screening mammogram for malignant neoplasm of breast: Secondary | ICD-10-CM

## 2012-10-04 ENCOUNTER — Emergency Department (HOSPITAL_COMMUNITY)
Admission: EM | Admit: 2012-10-04 | Discharge: 2012-10-04 | Disposition: A | Discharge status: Home / Self Care | Payer: Self-pay | Attending: Emergency Medicine | Admitting: Emergency Medicine

## 2012-10-04 ENCOUNTER — Encounter (HOSPITAL_COMMUNITY): Payer: Self-pay | Admitting: Emergency Medicine

## 2012-10-04 DIAGNOSIS — R197 Diarrhea, unspecified: Secondary | ICD-10-CM | POA: Insufficient documentation

## 2012-10-04 DIAGNOSIS — R112 Nausea with vomiting, unspecified: Secondary | ICD-10-CM | POA: Insufficient documentation

## 2012-10-04 LAB — COMPREHENSIVE METABOLIC PANEL
Alkaline Phosphatase: 42 U/L (ref 39–117)
BUN: 16 mg/dL (ref 6–23)
Chloride: 103 mEq/L (ref 96–112)
GFR calc Af Amer: >90 mL/min (ref >90)
GFR calc non Af Amer: >90 mL/min (ref >90)
Glucose, Bld: 113 mg/dL — ABNORMAL HIGH (ref 70–99)
Potassium: 4 mEq/L (ref 3.5–5.1)
Total Bilirubin: 0.7 mg/dL (ref 0.3–1.2)

## 2012-10-04 LAB — LIPASE, BLOOD: Lipase: 12 U/L (ref 11–59)

## 2012-10-04 NOTE — ED Notes (Signed)
Called Pt name 3X and got no answer.

## 2012-10-04 NOTE — ED Notes (Signed)
Pt states she has been throwing up since 5 am.  States that she has been throwing up on the hour.  Also c/o mid abd pain and "mild diarrhea".  Abd pain described as "uncomfortable".

## 2012-12-17 ENCOUNTER — Other Ambulatory Visit (HOSPITAL_BASED_OUTPATIENT_CLINIC_OR_DEPARTMENT_OTHER): Payer: BC Managed Care – PPO

## 2012-12-17 DIAGNOSIS — D72819 Decreased white blood cell count, unspecified: Secondary | ICD-10-CM

## 2012-12-17 LAB — CBC WITH DIFFERENTIAL/PLATELET
BASO%: 0.9 % (ref 0.0–2.0)
EOS%: 0.8 % (ref 0.0–7.0)
MCH: 32.9 pg (ref 25.1–34.0)
MCHC: 35.6 g/dL (ref 31.5–36.0)
RDW: 12.6 % (ref 11.2–14.5)
lymph#: 1.7 10*3/uL (ref 0.9–3.3)

## 2013-05-18 ENCOUNTER — Other Ambulatory Visit: Payer: Self-pay | Admitting: Oncology

## 2013-06-04 ENCOUNTER — Other Ambulatory Visit: Payer: Self-pay

## 2013-06-16 ENCOUNTER — Encounter: Payer: Self-pay | Admitting: Hematology and Oncology

## 2013-06-16 ENCOUNTER — Other Ambulatory Visit: Payer: Self-pay | Admitting: Hematology and Oncology

## 2013-06-16 DIAGNOSIS — D72819 Decreased white blood cell count, unspecified: Secondary | ICD-10-CM

## 2013-06-16 HISTORY — DX: Decreased white blood cell count, unspecified: D72.819

## 2013-06-17 ENCOUNTER — Encounter: Payer: Self-pay | Admitting: Hematology and Oncology

## 2013-06-17 ENCOUNTER — Other Ambulatory Visit (HOSPITAL_BASED_OUTPATIENT_CLINIC_OR_DEPARTMENT_OTHER): Payer: BC Managed Care – PPO | Admitting: Lab

## 2013-06-17 ENCOUNTER — Ambulatory Visit (HOSPITAL_BASED_OUTPATIENT_CLINIC_OR_DEPARTMENT_OTHER): Payer: BC Managed Care – PPO | Admitting: Hematology and Oncology

## 2013-06-17 VITALS — BP 118/81 | HR 92 | Temp 98.9°F | Resp 20 | Ht 64.0 in | Wt 174.4 lb

## 2013-06-17 DIAGNOSIS — R718 Other abnormality of red blood cells: Secondary | ICD-10-CM

## 2013-06-17 DIAGNOSIS — D729 Disorder of white blood cells, unspecified: Secondary | ICD-10-CM

## 2013-06-17 DIAGNOSIS — D72819 Decreased white blood cell count, unspecified: Secondary | ICD-10-CM

## 2013-06-17 DIAGNOSIS — Z23 Encounter for immunization: Secondary | ICD-10-CM

## 2013-06-17 LAB — CBC WITH DIFFERENTIAL/PLATELET
Basophils Absolute: 0 10*3/uL (ref 0.0–0.1)
EOS%: 1 % (ref 0.0–7.0)
Eosinophils Absolute: 0 10*3/uL (ref 0.0–0.5)
HGB: 13.5 g/dL (ref 11.6–15.9)
MCH: 32.2 pg (ref 25.1–34.0)
NEUT#: 1.6 10*3/uL (ref 1.5–6.5)
RBC: 4.19 10*6/uL (ref 3.70–5.45)
RDW: 12.9 % (ref 11.2–14.5)
lymph#: 1.9 10*3/uL (ref 0.9–3.3)
nRBC: 0 % (ref 0–0)

## 2013-06-17 MED ORDER — INFLUENZA VAC SPLIT QUAD 0.5 ML IM SUSP
0.5000 mL | INTRAMUSCULAR | Status: AC
  Administered 2013-06-17: 0.5 mL via INTRAMUSCULAR
  Filled 2013-06-17: qty 0.5

## 2013-06-17 NOTE — Progress Notes (Signed)
[facility] OFFICE PROGRESS NOTE  ELMAHDY,WAGDY, MD DIAGNOSIS:  Chronic leukopenia  SUMMARY OF HEMATOLOGIC HISTORY: This is a pleasant 52 year old lady with recurrent abnormal CBC. She was being observed. INTERVAL HISTORY: Tina [name] 52 y.o. female returns for further followup. She denies any recent infection. She denies any recent fever, chills, night sweats or abnormal weight loss  I have reviewed the past medical history, past surgical history, social history and family history with the patient and they are unchanged from previous note.  ALLERGIES:  has No Known Allergies.  MEDICATIONS:  Current Outpatient Prescriptions  Medication Sig Dispense Refill  . ascorbic acid (VITAMIN C) 1000 MG tablet Take 1,000 mg by mouth daily.      . cholecalciferol (VITAMIN D) 1000 UNITS tablet Take 1,000 Units by mouth daily.      . Coenzyme Q10 (CO Q 10 PO) Take by mouth daily.      Marland Kitchen ibuprofen (ADVIL,MOTRIN) 200 MG tablet Take 200 mg by mouth as needed for pain.       Marland Kitchen lisinopril-hydrochlorothiazide (PRINZIDE,ZESTORETIC) 20-12.5 MG per tablet Take 1 tablet by mouth 2 (two) times daily.      Marland Kitchen OVER THE COUNTER MEDICATION Trace Mineral      . VITAMIN E PO Take by mouth daily.       Current Facility-Administered Medications  Medication Dose Route Frequency Provider Last Rate Last Dose  . [START ON 06/18/2013] influenza vac split quadrivalent PF (FLUARIX) injection 0.5 mL  0.5 mL Intramuscular Tomorrow-1000 Marolyn Urschel, MD         REVIEW OF SYSTEMS:   Constitutional: Denies fevers, chills or night sweats All other systems were reviewed with the patient and are negative.  PHYSICAL EXAMINATION: ECOG PERFORMANCE STATUS: 0 - Asymptomatic  Filed Vitals:   06/17/13 1304  BP: 118/81  Pulse: 92  Temp: 98.9 F (37.2 C)  Resp: 20   Filed Weights   06/17/13 1304  Weight: 174 lb 6.4 oz (79.107 kg)    GENERAL:alert, no distress and comfortable NEURO: alert & oriented x 3  with fluent speech, no focal motor/sensory deficits  LABORATORY DATA:  I have reviewed the data as listed Results for orders placed in visit on 06/17/13 (from the past 48 hour(s))  CBC WITH DIFFERENTIAL     Status: None   Collection Time    06/17/13 12:45 PM      Result Value Range   WBC 3.9  3.9 - 10.3 10e3/uL   NEUT# 1.6  1.5 - 6.5 10e3/uL   HGB 13.5  11.6 - 15.9 g/dL   HCT 16.1  09.6 - 04.5 %   Platelets 224  145 - 400 10e3/uL   MCV 92.1  79.5 - 101.0 fL   MCH 32.2  25.1 - 34.0 pg   MCHC 35.0  31.5 - 36.0 g/dL   RBC 4.09  8.11 - 9.14 10e6/uL   RDW 12.9  11.2 - 14.5 %   lymph# 1.9  0.9 - 3.3 10e3/uL   MONO# 0.3  0.1 - 0.9 10e3/uL   Eosinophils Absolute 0.0  0.0 - 0.5 10e3/uL   Basophils Absolute 0.0  0.0 - 0.1 10e3/uL   NEUT% 41.0  38.4 - 76.8 %   LYMPH% 49.6  14.0 - 49.7 %   MONO% 7.4  0.0 - 14.0 %   EOS% 1.0  0.0 - 7.0 %   BASO% 1.0  0.0 - 2.0 %   nRBC 0  0 - 0 %    Lab  Results  Component Value Date   WBC 3.9 06/17/2013   HGB 13.5 06/17/2013   HCT 38.6 06/17/2013   MCV 92.1 06/17/2013   PLT 224 06/17/2013   ASSESSMENT & PLAN:  #1 recurrent abnormal CBC I observed that she had fluctuation of her white count over the past 4 years. For most of the times, her white count were within normal limits. This is a benign feature. It is not uncommon for African American to have a borderline low normal white blood cell count. I reassured the patient that this does not fit into the pattern of leukemia. I would discharge patient from the clinic. Unless symptomatic, I would not recommend we repeat CBC on a regular basis. #2 preventive care We discussed the importance of preventive care and reviewed the vaccination programs. She does not have any prior allergic reactions to influenza vaccination. She agrees to proceed with influenza vaccination today and we will administer it today at the clinic.  All questions were answered. The patient knows to call the clinic with any problems,  questions or concerns. No barriers to learning was detected.  I spent 15 minutes counseling the patient face to face. The total time spent in the appointment was 20 minutes and more than 50% was on counseling.     Baileigh Modisette, MD 06/17/2013 1:24 PM

## 2013-07-21 ENCOUNTER — Ambulatory Visit: Payer: Self-pay | Admitting: Podiatry

## 2013-07-30 DIAGNOSIS — C50919 Malignant neoplasm of unspecified site of unspecified female breast: Secondary | ICD-10-CM

## 2013-07-30 DIAGNOSIS — Z923 Personal history of irradiation: Secondary | ICD-10-CM

## 2013-07-30 HISTORY — DX: Malignant neoplasm of unspecified site of unspecified female breast: C50.919

## 2013-07-30 HISTORY — PX: BREAST BIOPSY: SHX20

## 2013-07-30 HISTORY — DX: Personal history of irradiation: Z92.3

## 2013-07-30 HISTORY — PX: BREAST LUMPECTOMY: SHX2

## 2013-08-20 ENCOUNTER — Ambulatory Visit (INDEPENDENT_AMBULATORY_CARE_PROVIDER_SITE_OTHER): Payer: BC Managed Care – PPO | Admitting: Family Medicine

## 2013-08-20 ENCOUNTER — Encounter: Payer: Self-pay | Admitting: Family Medicine

## 2013-08-20 ENCOUNTER — Other Ambulatory Visit (HOSPITAL_COMMUNITY): Payer: Self-pay | Admitting: Obstetrics

## 2013-08-20 VITALS — BP 132/90 | Temp 99.7°F | Ht 64.0 in | Wt 171.0 lb

## 2013-08-20 DIAGNOSIS — M25512 Pain in left shoulder: Secondary | ICD-10-CM | POA: Insufficient documentation

## 2013-08-20 DIAGNOSIS — Z1231 Encounter for screening mammogram for malignant neoplasm of breast: Secondary | ICD-10-CM

## 2013-08-20 DIAGNOSIS — M25519 Pain in unspecified shoulder: Secondary | ICD-10-CM

## 2013-08-20 DIAGNOSIS — R011 Cardiac murmur, unspecified: Secondary | ICD-10-CM

## 2013-08-20 DIAGNOSIS — Z7689 Persons encountering health services in other specified circumstances: Secondary | ICD-10-CM

## 2013-08-20 DIAGNOSIS — Z7189 Other specified counseling: Secondary | ICD-10-CM

## 2013-08-20 DIAGNOSIS — I10 Essential (primary) hypertension: Secondary | ICD-10-CM

## 2013-08-20 NOTE — Patient Instructions (Signed)
-  PLEASE SIGN UP FOR MYCHART TODAY   We recommend the following healthy lifestyle measures: - eat a healthy diet consisting of lots of vegetables, fruits, beans, nuts, seeds, healthy meats such as white chicken and fish and whole grains.  - avoid fried foods, fast food, processed foods, sodas, red meet and other fattening foods.  - get a least 150 minutes of aerobic exercise per week.   -We placed a referral for you as discussed for the cardiac echo. It usually takes about 1-2 weeks to process and schedule this referral. If you have not heard from [place] regarding this appointment in 2 weeks please contact our office.   Follow up in: October for your physical or sooner as needed

## 2013-08-20 NOTE — Progress Notes (Signed)
Pre visit review using our clinic review tool, if applicable. No additional management support is needed unless otherwise documented below in the visit note. 

## 2013-08-20 NOTE — Progress Notes (Signed)
Chief Complaint  Patient presents with  . Establish Care    HPI:  Tina [name] is here to establish care. Used to see Dr. Leanne Chang.  Last PCP and physical: sees Dr. Ruthann Cancer in gyn for yearly physicals  Has the following chronic problems and concerns today:  Patient Active Problem List   Diagnosis Date Noted  . Left shoulder pain - seeing Dr. Lorin Mercy 08/20/2013  . Hypertension    HTN: -on prinzide -denies: CP, SOB, swelling -had all her labs in 04/2013 at [place]: -reports has "aneurysm in eye" and possible retinal issues -followed by Dr. Peter Garter  L shoulder pain and DDD in neck: -sees [place] Dr. Lorin Mercy for this, did PT  Health Maintenance: -had flu vaccine, gets mammograms yearly - having this tomorrow -colon cancer screening - wants to hold off on this  ROS: See pertinent positives and negatives per HPI.  Past Medical History  Diagnosis Date  . Leukocytopenia   . Hypertension   . Degenerative joint disease   . Leukopenia 06/16/2013  . Needs flu shot 06/17/2013    Family History  Problem Relation Age of Onset  . Cancer Mother     uterine cancer  . Cancer Father     prostate ca  . Hypertension Mother   . Dementia Mother     small vessel disease  . Diabetes Father     History   Social History  . Marital Status: Married    Spouse Name: N/A    Number of Children: N/A  . Years of Education: N/A   Social History Main Topics  . Smoking status: Former Smoker -- .5 years    Types: Cigars  . Smokeless tobacco: Never Used     Comment: remote smoking history  . Alcohol Use: 0.0 oz/week    0 Shots of liquor per week     Comment: occ   . Drug Use: No  . Sexual Activity: None   Other Topics Concern  . 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      Home Situation: lives with husband and 53 yo son      Spiritual Beliefs: Christian      Lifestyle: elliptical 3-4 times per week; working on diet as well over last year in 2014              Current outpatient prescriptions:ascorbic acid (VITAMIN C) 1000 MG tablet, Take 1,000 mg by mouth daily., Disp: , Rfl: ;  aspirin 81 MG tablet, Take 81 mg by mouth daily., Disp: , Rfl: ;  cholecalciferol (VITAMIN D) 1000 UNITS tablet, Take 1,000 Units by mouth daily., Disp: , Rfl: ;  Coenzyme Q10 (CO Q 10 PO), Take by mouth daily., Disp: , Rfl: ;  L-ARGININE PO, Take by mouth daily., Disp: , Rfl:  lisinopril-hydrochlorothiazide (PRINZIDE,ZESTORETIC) 20-12.5 MG per tablet, Take 1 tablet by mouth 2 (two) times daily., Disp: , Rfl: ;  OVER THE COUNTER MEDICATION, Trace Mineral, Disp: , Rfl: ;  VITAMIN E PO, Take by mouth daily., Disp: , Rfl:   EXAM:  Filed Vitals:   08/20/13 1546  BP: 132/90  Temp: 99.7 F (37.6 C)    Body mass index is 29.34 kg/(m^2).  GENERAL: vitals reviewed and listed above, alert, oriented, appears well hydrated and in no acute distress  HEENT: atraumatic, conjunttiva clear, no obvious abnormalities on inspection of external nose and ears  NECK: no obvious masses on inspection  LUNGS: clear  to auscultation bilaterally, no wheezes, rales or rhonchi, good air movement  CV: HRRR, I/VI SEM, no peripheral edema  MS: moves all extremities without noticeable abnormality  PSYCH: pleasant and cooperative, no obvious depression or anxiety  ASSESSMENT AND PLAN:  Discussed the following assessment and plan:  Encounter to establish care  Left shoulder pain - seeing Dr. Lorin Mercy  Cardiac murmur - Plan: 2D Echocardiogram without contrast -asymptomatic, benign sounding, pt reports never had this in the past and is worried about this and wants to do echo  Hypertension  -We reviewed the PMH, PSH, FH, SH, Meds and Allergies. -We provided refills for any medications we will prescribe as needed. -We addressed current concerns per orders and patient instructions. -We have asked for records for pertinent exams, studies, vaccines and notes from previous  providers. -We have advised patient to follow up per instructions below. -follow up for annual exam in October  -Patient advised to return or notify a doctor immediately if symptoms worsen or persist or new concerns arise.  Patient Instructions  -PLEASE SIGN UP FOR MYCHART TODAY   We recommend the following healthy lifestyle measures: - eat a healthy diet consisting of lots of vegetables, fruits, beans, nuts, seeds, healthy meats such as white chicken and fish and whole grains.  - avoid fried foods, fast food, processed foods, sodas, red meet and other fattening foods.  - get a least 150 minutes of aerobic exercise per week.   -We placed a referral for you as discussed for the cardiac echo. It usually takes about 1-2 weeks to process and schedule this referral. If you have not heard from [place] regarding this appointment in 2 weeks please contact our office.   Follow up in: October for your physical or sooner as needed      [name]

## 2013-08-21 ENCOUNTER — Ambulatory Visit (HOSPITAL_COMMUNITY)
Admission: RE | Admit: 2013-08-21 | Discharge: 2013-08-21 | Disposition: A | Discharge status: Home / Self Care | Payer: BC Managed Care – PPO | Source: Ambulatory Visit | Attending: Obstetrics | Admitting: Obstetrics

## 2013-08-21 DIAGNOSIS — Z1231 Encounter for screening mammogram for malignant neoplasm of breast: Secondary | ICD-10-CM

## 2013-08-25 ENCOUNTER — Other Ambulatory Visit: Payer: Self-pay | Admitting: Obstetrics

## 2013-08-25 DIAGNOSIS — R928 Other abnormal and inconclusive findings on diagnostic imaging of breast: Secondary | ICD-10-CM

## 2013-08-30 HISTORY — PX: PORTACATH PLACEMENT: SHX2246

## 2013-09-03 ENCOUNTER — Ambulatory Visit
Admission: RE | Admit: 2013-09-03 | Discharge: 2013-09-03 | Disposition: A | Discharge status: Home / Self Care | Payer: BC Managed Care – PPO | Source: Ambulatory Visit | Attending: Obstetrics | Admitting: Obstetrics

## 2013-09-03 ENCOUNTER — Other Ambulatory Visit: Payer: Self-pay | Admitting: Obstetrics

## 2013-09-03 DIAGNOSIS — N6310 Unspecified lump in the right breast, unspecified quadrant: Secondary | ICD-10-CM

## 2013-09-03 DIAGNOSIS — R928 Other abnormal and inconclusive findings on diagnostic imaging of breast: Secondary | ICD-10-CM

## 2013-09-03 DIAGNOSIS — C50911 Malignant neoplasm of unspecified site of right female breast: Secondary | ICD-10-CM

## 2013-09-03 HISTORY — DX: Malignant neoplasm of unspecified site of right female breast: C50.911

## 2013-09-04 ENCOUNTER — Telehealth: Payer: Self-pay | Admitting: Family Medicine

## 2013-09-04 ENCOUNTER — Other Ambulatory Visit: Payer: Self-pay | Admitting: Obstetrics

## 2013-09-04 DIAGNOSIS — C50919 Malignant neoplasm of unspecified site of unspecified female breast: Secondary | ICD-10-CM

## 2013-09-04 NOTE — Telephone Encounter (Signed)
[facility] [phone]

## 2013-09-04 NOTE — Telephone Encounter (Signed)
Pt needs to resc the echocardiogram scheduled for Friday 7:30 am. Who does pt need to call to do this?

## 2013-09-07 ENCOUNTER — Telehealth: Payer: Self-pay | Admitting: *Deleted

## 2013-09-07 NOTE — Telephone Encounter (Signed)
Called and spoke with patient.  Discussed navigator resources.  Encouraged patient to call with any questions or concerns.  Contact information given.

## 2013-09-09 ENCOUNTER — Other Ambulatory Visit: Payer: Self-pay | Admitting: Obstetrics

## 2013-09-09 ENCOUNTER — Encounter (INDEPENDENT_AMBULATORY_CARE_PROVIDER_SITE_OTHER): Payer: Self-pay | Admitting: General Surgery

## 2013-09-09 ENCOUNTER — Ambulatory Visit (INDEPENDENT_AMBULATORY_CARE_PROVIDER_SITE_OTHER): Payer: BC Managed Care – PPO | Admitting: General Surgery

## 2013-09-09 VITALS — BP 126/72 | HR 90 | Temp 99.0°F | Resp 18 | Ht 64.0 in | Wt 170.0 lb

## 2013-09-09 DIAGNOSIS — C50519 Malignant neoplasm of lower-outer quadrant of unspecified female breast: Secondary | ICD-10-CM

## 2013-09-09 DIAGNOSIS — C50919 Malignant neoplasm of unspecified site of unspecified female breast: Secondary | ICD-10-CM

## 2013-09-10 ENCOUNTER — Telehealth: Payer: Self-pay | Admitting: *Deleted

## 2013-09-10 NOTE — Telephone Encounter (Signed)
Received appt date and time from Dr. Humphrey Rolls.  Called and confirmed 09/16/13 appt w/ pt.  Unable to schedule Dr. Laurelyn Sickle appt - emailed Audie Clear to enter it.  Mailed before appt letter, welcome packet & intake form to pt.  Emailed Alisha at [place] to make her aware.  Emailed Santiago Glad in [place] to make her aware.  Took paperwork to [facility] for chart.

## 2013-09-10 NOTE — Progress Notes (Signed)
Patient ID: Tina [name], female   DOB: 03-30-1961, 53 y.o.   MRN: [id number]  Chief Complaint  Patient presents with  . New Evaluation    rt br ca    HPI Tina [name] is a 53 y.o. female.  Referred by Dr Enrique Sack HPI 70 yof who underwent routine screening mammogram that showed a possible right breast mass.  Magnification views show an oval, poorly defined mass in the lower outer quadrant of the right breast that contains small, poorly defined microcalcifications.  U/S shows a 1.2x1.1x1 cm round hypoechoic mass with mildly irregular margins in the 8 oclock position of the right breast 4 cm from nipple.  There are no abnormal appearing right axillary lymph nodes.  She is due for an mri on Friday.  She underwent core biopsy that shows an invasive ductal carcinoma with dcis, this is her2 positive, grade III and er/pr are pending.  She has family history of uterine or cervical cancer in her mother.  She comes in today to discuss options.  Past Medical History  Diagnosis Date  . Leukocytopenia   . Hypertension   . Degenerative joint disease   . Leukopenia 06/16/2013  . Needs flu shot 06/17/2013    Past Surgical History  Procedure Laterality Date  . Cesarean section    . Carpal tunnel release    . Trigger finger release      Family History  Problem Relation Age of Onset  . Hypertension Mother   . Dementia Mother     small vessel disease  . Cancer Mother     cervical  cancer  . Cancer Father     prostate ca  . Diabetes Father   . Arthritis Father     Social History History  Substance Use Topics  . Smoking status: Former Smoker -- .5 years    Types: Cigars  . Smokeless tobacco: Never Used     Comment: remote smoking history  . Alcohol Use: 0.0 oz/week    0 Shots of liquor per week     Comment: occ     No Known Allergies  Current Outpatient Prescriptions  Medication Sig Dispense Refill  . ascorbic acid (VITAMIN C) 1000 MG tablet Take 1,000 mg by mouth daily.       Marland Kitchen aspirin 81 MG tablet Take 81 mg by mouth daily.      Marland Kitchen BIOTIN 5000 PO Take by mouth.      . calcium carbonate 200 MG capsule Take 250 mg by mouth 2 (two) times daily with a meal.      . cholecalciferol (VITAMIN D) 1000 UNITS tablet Take 1,000 Units by mouth daily.      . Coenzyme Q10 (CO Q 10 PO) Take by mouth daily.      . L-ARGININE PO Take by mouth daily.      Marland Kitchen lisinopril-hydrochlorothiazide (PRINZIDE,ZESTORETIC) 20-12.5 MG per tablet Take 1 tablet by mouth 2 (two) times daily.      Marland Kitchen OVER THE COUNTER MEDICATION Trace Mineral      . Specialty Vitamins Products (MAGNESIUM, AMINO ACID CHELATE,) 133 MG tablet Take 1 tablet by mouth 2 (two) times daily.      Marland Kitchen VITAMIN E PO Take by mouth daily.       No current facility-administered medications for this visit.    Review of Systems Review of Systems  Constitutional: Negative for fever, chills and unexpected weight change.  HENT: Negative for congestion, hearing loss, sore throat, trouble swallowing and voice  change.   Eyes: Negative for visual disturbance.  Respiratory: Negative for cough and wheezing.   Cardiovascular: Positive for palpitations (undergoing evaluation by Dr Maudie Mercury). Negative for chest pain and leg swelling.  Gastrointestinal: Negative for nausea, vomiting, abdominal pain, diarrhea, constipation, blood in stool, abdominal distention and anal bleeding.  Genitourinary: Negative for hematuria, vaginal bleeding and difficulty urinating.  Musculoskeletal: Negative for arthralgias.  Skin: Negative for rash and wound.  Neurological: Positive for headaches. Negative for seizures and syncope.  Hematological: Negative for adenopathy. Does not bruise/bleed easily.  Psychiatric/Behavioral: Negative for confusion.    Blood pressure 126/72, pulse 90, temperature 99 F (37.2 C), resp. rate 18, height '5\' 4"'  (1.626 m), weight 170 lb (77.111 kg), last menstrual period 06/25/2013.  Physical Exam Physical Exam  Vitals  reviewed. Constitutional: She appears well-developed and well-nourished.  Neck: Neck supple.  Cardiovascular: Normal rate, regular rhythm and normal heart sounds.   Pulmonary/Chest: Effort normal and breath sounds normal. She has no wheezes. She has no rales. Right breast exhibits no inverted nipple, no mass, no nipple discharge, no skin change and no tenderness. Left breast exhibits no inverted nipple, no mass, no nipple discharge, no skin change and no tenderness.  Lymphadenopathy:    She has no cervical adenopathy.    She has no axillary adenopathy.       Right: No supraclavicular adenopathy present.       Left: No supraclavicular adenopathy present.    Data Reviewed EXAM:  DIGITAL DIAGNOSTIC RIGHT MAMMOGRAM  ULTRASOUND RIGHT BREAST  COMPARISON: Previous examinations, including the screening  mammogram dated 08/21/2013.  ACR Breast Density Category c: The breast tissue is heterogeneously  dense, which may obscure small masses.  FINDINGS:  Spot compression and spot magnification views of the right breast  demonstrate an oval, poorly defined mass deep in the lower outer  quadrant of the breast. This contains small, poorly defined  microcalcifications.  On physical exam, no mass is palpable in the lower outer right  breast or right axilla.  Ultrasound is performed, showing a 1.2 x 1.1 x 1.0 cm rounded,  hypoechoic mass with mildly irregular margins in the 8 o'clock  position of the right breast, 4 cm from the nipple. Some of the  margins are circumscribed and others are not as well defined. The  mass contains microcalcifications and has a small amount of internal  blood flow peripherally. There are no abnormal appearing right  axillary lymph nodes.  IMPRESSION:  1.2 cm right breast mass with imaging features suspicious for  malignancy. Ultrasound-guided core needle biopsy is recommended and  scheduled follow-up.  RECOMMENDATION:  Right breast ultrasound-guided core needle  biopsy (scheduled to  follow).   Assessment    Clinical stage I right breast cancer    Plan    MRI on Friday, med onc and rad onc appt, genetics appt. Then follow up.  We discussed the staging and pathophysiology of breast cancer. We discussed all of the different options for treatment for breast cancer including surgery, chemotherapy, radiation therapy, Herceptin, and antiestrogen therapy. I do think she will get chemotherapy and herceptin meaning she could have a port in place.  We discussed a sentinel lymph node biopsy as she does not appear to having lymph node involvement right now. We discussed the performance of that with injection of radioactive tracer and possibly blue dye. We discussed that she would have an incision underneath her axillary hairline. We discussed that there is a chance of having a positive node  with a sentinel lymph node biopsy and we will await the permanent pathology to make any other first further decisions in terms of her treatment. One of these options might be to return to the operating room to perform an axillary lymph node dissection. We discussed up to a 5% risk lifetime of chronic shoulder pain as well as lymphedema associated with a sentinel lymph node biopsy.  We discussed the options for treatment of the breast cancer which included lumpectomy versus a mastectomy. We discussed the performance of the lumpectomy with a wire placement. We discussed a 5-10% chance of a positive margin requiring reexcision in the operating room. We also discussed that she may need radiation therapy or antiestrogen therapy or both if she undergoes lumpectomy. We discussed the mastectomy and the postoperative care for that as well. We discussed that there is no difference in her survival whether she undergoes lumpectomy with radiation therapy or antiestrogen therapy versus a mastectomy.  We discussed the risks of operation including bleeding, infection, possible reoperation. She  understands her further therapy will be based on what her stages at the time of her operation.          Vernica Wachtel 09/10/2013, 12:07 PM

## 2013-09-10 NOTE — Telephone Encounter (Signed)
Confirmed 11/16/13 genetic appt w/ pt. Emailed Alisha at [place] to make aware.

## 2013-09-10 NOTE — Telephone Encounter (Addendum)
Called pt to give navigation resources.  Pt request to see nutritionist.  Gave pt information on nutrition class for our breast patients.

## 2013-09-10 NOTE — Telephone Encounter (Signed)
Pt aware.

## 2013-09-11 ENCOUNTER — Ambulatory Visit (HOSPITAL_COMMUNITY): Payer: Self-pay

## 2013-09-11 ENCOUNTER — Ambulatory Visit
Admission: RE | Admit: 2013-09-11 | Discharge: 2013-09-11 | Disposition: A | Discharge status: Home / Self Care | Payer: BC Managed Care – PPO | Source: Ambulatory Visit | Attending: Obstetrics | Admitting: Obstetrics

## 2013-09-11 DIAGNOSIS — C50919 Malignant neoplasm of unspecified site of unspecified female breast: Secondary | ICD-10-CM

## 2013-09-11 MED ORDER — GADOBENATE DIMEGLUMINE 529 MG/ML IV SOLN
15.0000 mL | Freq: Once | INTRAVENOUS | Status: AC | PRN
  Administered 2013-09-11: 15 mL via INTRAVENOUS

## 2013-09-14 ENCOUNTER — Other Ambulatory Visit: Payer: Self-pay | Admitting: Obstetrics

## 2013-09-14 ENCOUNTER — Encounter: Payer: Self-pay | Admitting: Radiation Oncology

## 2013-09-14 DIAGNOSIS — R928 Other abnormal and inconclusive findings on diagnostic imaging of breast: Secondary | ICD-10-CM

## 2013-09-14 NOTE — Progress Notes (Signed)
Location of Breast Cancer:Right Breast  Histology per Pathology Report:  Diagnosis Breast, right, needle core biopsy, 8 o'clock - INVASIVE DUCTAL CARCINOMA. - DUCTAL CARCINOMA IN SITU.  Receptor Status: ER(7%), PR (6%), Her2-neu (Amplification - HER2/NEU BY CISH - SHOWS AMPLIFICATION BY [facility] ANALYSIS.RESULTRATIO OF HER2: CEP 17 SIGNALS 2.22AVERAGE HER2 COPY NUMBER PER CELL 3.55REFERENCE RANGE, Ki-67 (70%)   Found on screening mammography?: Routine screening mammogram that showed a possible right breast mass. Magnification views show an oval, poorly defined mass in the lower outer quadrant of the right breast that contains small, poorly defined microcalcifications. U/S shows a 1.2x1.1x1 cm round hypoechoic mass with mildly irregular margins in the 8 oclock position of the right breast 4 cm from nipple. There are no abnormal appearing right axillary lymph nodes   Past/Anticipated interventions by surgeon, if RVU:YEBXID Core Biopsy of the Right Breast  Past/Anticipated interventions by medical oncology, if any: Chemotherapy :  09/16/13 appointment with Dr. Marcy Panning ,  Lymphedema issues, if any:    Pain issues, if any:   SAFETY ISSUES:  Prior radiation?NO  Pacemaker/ICD?NO  Possible current pregnancy? NO       Is the patient on methotrexate? NO    Current Complaints / other details: former s,moker x 15 years: 1 ppd, No alcohol, hx of Marijuana     Menses age 68, Parity age 33, G56,P3, BC x 5 years, No HRT  History of Intermittent leukopenia and neutropenia.  Seen by Dr. Lamonte Sakai and Dr. Alvy Bimler in the past   Cardiac Echo scheduled 09/29/13 - Hx of Palpitations, ? Heart Murmur

## 2013-09-15 ENCOUNTER — Encounter: Payer: Self-pay | Admitting: Radiation Oncology

## 2013-09-15 ENCOUNTER — Ambulatory Visit
Admission: RE | Admit: 2013-09-15 | Discharge: 2013-09-15 | Disposition: A | Discharge status: Home / Self Care | Payer: BC Managed Care – PPO | Source: Ambulatory Visit | Attending: Radiation Oncology | Admitting: Radiation Oncology

## 2013-09-15 ENCOUNTER — Other Ambulatory Visit: Payer: Self-pay | Admitting: *Deleted

## 2013-09-15 ENCOUNTER — Telehealth: Payer: Self-pay | Admitting: *Deleted

## 2013-09-15 VITALS — BP 148/87 | HR 89 | Temp 98.6°F | Ht 64.0 in | Wt 173.3 lb

## 2013-09-15 DIAGNOSIS — D0590 Unspecified type of carcinoma in situ of unspecified breast: Secondary | ICD-10-CM | POA: Insufficient documentation

## 2013-09-15 DIAGNOSIS — Z79899 Other long term (current) drug therapy: Secondary | ICD-10-CM | POA: Insufficient documentation

## 2013-09-15 DIAGNOSIS — I10 Essential (primary) hypertension: Secondary | ICD-10-CM | POA: Insufficient documentation

## 2013-09-15 DIAGNOSIS — C50511 Malignant neoplasm of lower-outer quadrant of right female breast: Secondary | ICD-10-CM | POA: Insufficient documentation

## 2013-09-15 DIAGNOSIS — C50911 Malignant neoplasm of unspecified site of right female breast: Secondary | ICD-10-CM

## 2013-09-15 DIAGNOSIS — Z8249 Family history of ischemic heart disease and other diseases of the circulatory system: Secondary | ICD-10-CM | POA: Insufficient documentation

## 2013-09-15 DIAGNOSIS — C50519 Malignant neoplasm of lower-outer quadrant of unspecified female breast: Secondary | ICD-10-CM

## 2013-09-15 DIAGNOSIS — M1990 Unspecified osteoarthritis, unspecified site: Secondary | ICD-10-CM | POA: Insufficient documentation

## 2013-09-15 DIAGNOSIS — Z7982 Long term (current) use of aspirin: Secondary | ICD-10-CM | POA: Insufficient documentation

## 2013-09-15 DIAGNOSIS — Z833 Family history of diabetes mellitus: Secondary | ICD-10-CM | POA: Insufficient documentation

## 2013-09-15 DIAGNOSIS — C50919 Malignant neoplasm of unspecified site of unspecified female breast: Secondary | ICD-10-CM | POA: Insufficient documentation

## 2013-09-15 HISTORY — DX: Malignant neoplasm of unspecified site of right female breast: C50.911

## 2013-09-15 NOTE — Telephone Encounter (Signed)
Called patient home phone, she stated she had called and would be late, warming her car now and should be there within an hour if that was okay?", yes, encourage her to drive safe take her time.will inform Dr.Squire 8:37 AM

## 2013-09-15 NOTE — Progress Notes (Signed)
Radiation Oncology         [phone]) [phone] ________________________________  Initial outpatient Consultation  Name: Tina [name] MRN: [id number]  Date: 09/15/2013  DOB: 09-20-1960  CC:KIM, Nickola Major., DO  Rolm Bookbinder, MD   REFERRING PHYSICIAN: Rolm Bookbinder, MD  DIAGNOSIS: clinical [id number] Stage II Invasive Ductal Carcinoma with DCIS, Right breast, Grade III, ER 7%, PR 6%, Her2 neu +. Ki67 70%  HISTORY OF PRESENT ILLNESS::Tina [name] is a 53 y.o. female who was found to have a suspicious mass in her Right Breast on screening mammography on 08-24-13. Mag View: mass with microcalc's.  Ultrasound on 09-03-13  showed a 1.2 x 1.1 x 1.0 cm rounded, hypoechoic mass with mildly irregular margins in the 8 o'clock position of the right breast, 4 cm from the nipple... There were no abnormal appearing right axillary lymph nodes.  She had not noted any palpable masses in her right breast but had suspected a possible mass in her left breast (UOQ - hard to appreciate today) around November.   Biopsy of right breast, 8:00, on 09/03/13 showed Invasive Ductal Carcinoma with DCIS, Right breast, Grade III, ER 7%, PR 6%, Her2 +, Ki67 70%.  MRI of breasts on 09-14-13 showed    1. A 2.2 x 1.3 x 1.5 cm irregular mass in lower outer right breast  consistent with the recently biopsied invasive ductal carcinoma.  2. A 7 x 5 x 8 cm indeterminate enhancing mass in the upper-outer  left breast.  3. A 9 x 7 x 6 mm indeterminate enhancing mass at 9 o'clock in the  left breast.  4. Prominent cortices of right axillary lymph nodes.   RECOMMENDATION:  Ultrasound is recommended for evaluation of the masses in the left  breast. If seen sonographically, ultrasound-guided biopsies are  recommended. If these masses are not seen on ultrasound, MRI guided  biopsies are recommended. Second-look ultrasound of the right axilla  is also recommended given the MRI finding of right axillary nodes  with prominent cortices.     She sees Dr. Humphrey Rolls tomorrow. She denies any family history of ovarian or breast cancer to her knowledge. She denies any hormonal supplementation. Her last menstrual cycle was in November 2014. She denies prior cancers or radiotherapy in her own history. She works out regularly at [employer].  PREVIOUS RADIATION THERAPY: No  PAST MEDICAL HISTORY:  has a past medical history of Leukocytopenia; Hypertension; Degenerative joint disease; Leukopenia (06/16/2013); Needs flu shot (06/17/2013); and Cancer of right breast (09/03/13).    PAST SURGICAL HISTORY: Past Surgical History  Procedure Laterality Date  . Cesarean section    . Carpal tunnel release    . Trigger finger release      FAMILY HISTORY: family history includes Arthritis in her father; Cancer in her father and mother; Dementia in her mother; Diabetes in her father; Hypertension in her mother.  SOCIAL HISTORY:  reports that she has quit smoking. Her smoking use included Cigars and Cigarettes. She has a 15 pack-year smoking history. She has never used smokeless tobacco. She reports that she drinks alcohol. She reports that she uses illicit drugs.  ALLERGIES: Review of patient's allergies indicates no known allergies.  MEDICATIONS:  Current Outpatient Prescriptions  Medication Sig Dispense Refill  . ascorbic acid (VITAMIN C) 1000 MG tablet Take 1,000 mg by mouth daily.      Marland Kitchen aspirin 81 MG tablet Take 81 mg by mouth daily.      Marland Kitchen BIOTIN 5000 PO Take by mouth.      Marland Kitchen  calcium carbonate 200 MG capsule Take 250 mg by mouth 2 (two) times daily with a meal.      . cholecalciferol (VITAMIN D) 1000 UNITS tablet Take 1,000 Units by mouth daily. Takes 10,000 units daily      . Coenzyme Q10 (CO Q 10 PO) Take by mouth daily.      . L-ARGININE PO Take by mouth daily.      Marland Kitchen lisinopril-hydrochlorothiazide (PRINZIDE,ZESTORETIC) 20-12.5 MG per tablet Take 1 tablet by mouth 2 (two) times daily.      Marland Kitchen OVER THE COUNTER MEDICATION Trace Mineral      .  VITAMIN E PO Take by mouth daily.       No current facility-administered medications for this encounter.    REVIEW OF SYSTEMS:  Notable for that above.   PHYSICAL EXAM:  height is _0  (1.626 m) and weight is 173 lb 4.8 oz (78.608 kg). Her temperature is 98.6 F (37 C). Her blood pressure is 148/87 and her pulse is 89.   General: Alert and oriented, in no acute distress HEENT: Head is normocephalic.  Extraocular movements are intact. Oropharynx is clear. Neck: Neck is supple, no palpable cervical or supraclavicular lymphadenopathy. Heart: Regular in rate and rhythm with no murmurs, rubs, or gallops. Chest: Clear to auscultation bilaterally, with no rhonchi, wheezes, or rales. Abdomen: Soft, nontender, nondistended, with no rigidity or guarding. Extremities: No cyanosis or edema in UE's. Skin: No concerning lesions. Neurologic: Cranial nerves II through XII are grossly intact. No obvious focalities. Speech is fluent. Coordination is intact. Psychiatric: Judgment and insight are intact. Affect is appropriate. Breasts: LOQ right breast mass, approx 3 cm (some may be pooled blood products post-bx.) Fullness in right axilla, but difficult to appreciate a discrete node. No other palpable masses in breasts appreciated.   ECOG = 0   0 - Asymptomatic (Fully active, able to carry on all predisease activities without restriction)  1 - Symptomatic but completely ambulatory (Restricted in physically strenuous activity but ambulatory and able to carry out work of a light or sedentary nature. For example, light housework, office work)  2 - Symptomatic, <50% in bed during the day (Ambulatory and capable of all self care but unable to carry out any work activities. Up and about more than 50% of waking hours)  3 - Symptomatic, >50% in bed, but not bedbound (Capable of only limited self-care, confined to bed or chair 50% or more of waking hours)  4 - Bedbound (Completely disabled. Cannot carry on any  self-care. Totally confined to bed or chair)  5 - Death   Eustace Pen MM, Creech RH, Tormey DC, et al. [phone]). "Toxicity and response criteria of the [facility]". [place] Oncol. 5 (6): 649-55   LABORATORY DATA:  Lab Results  Component Value Date   WBC 3.9 06/17/2013   HGB 13.5 06/17/2013   HCT 38.6 06/17/2013   MCV 92.1 06/17/2013   PLT 224 06/17/2013   CMP     Component Value Date/Time   NA 140 10/04/2012 1830   NA 139 06/16/2012 1303   K 4.0 10/04/2012 1830   K 4.1 06/16/2012 1303   CL 103 10/04/2012 1830   CL 104 06/16/2012 1303   CO2 24 10/04/2012 1830   CO2 30* 06/16/2012 1303   GLUCOSE 113* 10/04/2012 1830   GLUCOSE 85 06/16/2012 1303   BUN 16 10/04/2012 1830   BUN 11.0 06/16/2012 1303   CREATININE 0.74 10/04/2012 1830   CREATININE  0.9 06/16/2012 1303   CALCIUM 9.5 10/04/2012 1830   CALCIUM 9.8 06/16/2012 1303   PROT 8.7* 10/04/2012 1830   PROT 8.0 06/16/2012 1303   ALBUMIN 4.1 10/04/2012 1830   ALBUMIN 3.9 06/16/2012 1303   AST 32 10/04/2012 1830   AST 25 06/16/2012 1303   ALT 27 10/04/2012 1830   ALT 25 06/16/2012 1303   ALKPHOS 42 10/04/2012 1830   ALKPHOS 45 06/16/2012 1303   BILITOT 0.7 10/04/2012 1830   BILITOT 0.91 06/16/2012 1303   GFRNONAA >90 10/04/2012 1830   GFRAA >90 10/04/2012 1830         RADIOGRAPHY: Dg Eye Foreign Body  09/11/2013   CLINICAL 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  FINDINGS: Water's views with eyes deviated toward the left and toward the right were obtained. No radiopaque foreign body in the orbits identified. Paranasal sinuses are clear except for a small right frontal osteoma. No fracture or dislocation.  IMPRESSION: No evidence of metallic foreign body within the orbits. Small right frontal sinus osteoma, a benign finding.   Electronically Signed   By: Lowella Grip M.D.   On: 09/11/2013 09:23   Mr Breast Bilateral W Wo Contrast  09/14/2013   ADDENDUM  REPORT: 09/14/2013 08:23  ADDENDUM: The mass in the upper outer left breast, middle depth, measures 7 x 5 x 8 mm, not 7 x 5 x 8 cm.   Electronically Signed   By: Enrique Sack M.D.   On: 09/14/2013 08:23   09/14/2013   CLINICAL DATA:  New diagnosis of invasive ductal carcinoma and ductal carcinoma in situ in the right breast at 8 o'clock.  EXAM: BILATERAL BREAST MRI WITH AND WITHOUT CONTRAST  LABS:  BUN and creatinine were obtained on site at [place] at  [address]  Results:  BUN 9 mg/dL,  Creatinine 0.7 mg/dL.  TECHNIQUE: Multiplanar, multisequence MR images of both breasts were obtained prior to and following the intravenous administration of 15m of MultiHance.  THREE-DIMENSIONAL MR IMAGE RENDERING ON INDEPENDENT WORKSTATION:  Three-dimensional MR images were rendered by post-processing of the original MR data on an independent workstation. The three-dimensional MR images were interpreted, and findings are reported in the following complete MRI report for this study. Three dimensional images were evaluated at the independent DynaCad workstation  COMPARISON:  Previous exams  FINDINGS: Breast composition: c:  Heterogeneous fibroglandular tissue  Background parenchymal enhancement: Moderate  Right breast: In the lower outer right breast, there is an irregular enhancing mass measuring 2.2 x 1.3 by 1.5 cm. This is consistent with the recently biopsied invasive ductal carcinoma. Within this mass is susceptibility artifact, consistent with biopsy clip. No additional areas of abnormal enhancement are identified  Left breast: In the upper-outer left breast, middle depth, there is an oval circumscribed mass measuring 7 x 5 x 8 cm. This mass has mixed kinetics, which includes washout kinetics. At 9 o'clock in the left breast, there is an oval enhancing mass measuring 9 x 7 x 6 mm. There is associated plateau kinetics.  Lymph nodes: Right axillary lymph nodes with prominent cortices are identified, with  the largest node measuring up to 1 cm in short axis.  Ancillary findings:  None.  IMPRESSION: 1. A 2.2 x 1.3 x 1.5 cm irregular mass in lower outer right breast consistent with the recently biopsied invasive ductal carcinoma. 2. A 7 x 5 x 8 cm  indeterminate enhancing mass in the upper-outer left breast. 3. A 9 x 7 x 6 mm indeterminate enhancing mass at 9 o'clock in the left breast. 4. Prominent cortices of right axillary lymph nodes.  RECOMMENDATION: Ultrasound is recommended for evaluation of the masses in the left breast. If seen sonographically, ultrasound-guided biopsies are recommended. If these masses are not seen on ultrasound, MRI guided biopsies are recommended. Second-look ultrasound of the right axilla is also recommended given the MRI finding of right axillary nodes with prominent cortices.  BI-RADS CATEGORY  4: Suspicious abnormality - biopsy should be considered.  Electronically Signed: By: Donavan Burnet M.D. On: 09/11/2013 14:26   Mm Digital Diagnostic Unilat R  09/03/2013   CLINICAL DATA:  Status post ultrasound-guided core needle biopsy of a 1.2 cm mass in the 8 o'clock position of the right breast.  EXAM: POST-BIOPSY CLIP PLACEMENT LEFT DIAGNOSTIC MAMMOGRAM  COMPARISON:  Previous exams.  FINDINGS: Films are performed following ultrasound guided biopsy of a 1.2 cm mass in the 8 o'clock position of the right breast. These demonstrate a ribbon shaped biopsy marker clip in the anterior aspect of the mass.  IMPRESSION: Appropriate clip deployment following right breast ultrasound-guided core needle biopsy.  Final Assessment: Post Procedure Mammograms for Marker Placement   Electronically Signed   By: Enrique Sack M.D.   On: 09/03/2013 12:13   Mm Digital Diagnostic Unilat R  09/03/2013   CLINICAL DATA:  Possible right breast mass at recent screening mammography.  EXAM: DIGITAL DIAGNOSTIC  RIGHT MAMMOGRAM  ULTRASOUND RIGHT BREAST  COMPARISON:  Previous examinations, including the screening  mammogram dated 08/21/2013.  ACR Breast Density Category c: The breast tissue is heterogeneously dense, which may obscure small masses.  FINDINGS: Spot compression and spot magnification views of the right breast demonstrate an oval, poorly defined mass deep in the lower outer quadrant of the breast. This contains small, poorly defined microcalcifications.  On physical exam, no mass is palpable in the lower outer right breast or right axilla.  Ultrasound is performed, showing a 1.2 x 1.1 x 1.0 cm rounded, hypoechoic mass with mildly irregular margins in the 8 o'clock position of the right breast, 4 cm from the nipple. Some of the margins are circumscribed and others are not as well defined. The mass contains microcalcifications and has a small amount of internal blood flow peripherally. There are no abnormal appearing right axillary lymph nodes.  IMPRESSION: 1.2 cm right breast mass with imaging features suspicious for malignancy. Ultrasound-guided core needle biopsy is recommended and scheduled follow-up.  RECOMMENDATION: Right breast ultrasound-guided core needle biopsy (scheduled to follow).  I have discussed the findings and recommendations with the patient. Results were also provided in writing at the conclusion of the visit. If applicable, a reminder letter will be sent to the patient regarding the next appointment.  BI-RADS CATEGORY  4: Suspicious abnormality - biopsy should be considered.   Electronically Signed   By: Enrique Sack M.D.   On: 09/03/2013 11:49   US Breast Ltd Uni Right Inc Axilla  09/03/2013   CLINICAL DATA:  Possible right breast mass at recent screening mammography.  EXAM: DIGITAL DIAGNOSTIC  RIGHT MAMMOGRAM  ULTRASOUND RIGHT BREAST  COMPARISON:  Previous examinations, including the screening mammogram dated 08/21/2013.  ACR Breast Density Category c: The breast tissue is heterogeneously dense, which may obscure small masses.  FINDINGS: Spot compression and spot magnification views of the  right breast demonstrate an oval, poorly defined mass deep in the lower outer  quadrant of the breast. This contains small, poorly defined microcalcifications.  On physical exam, no mass is palpable in the lower outer right breast or right axilla.  Ultrasound is performed, showing a 1.2 x 1.1 x 1.0 cm rounded, hypoechoic mass with mildly irregular margins in the 8 o'clock position of the right breast, 4 cm from the nipple. Some of the margins are circumscribed and others are not as well defined. The mass contains microcalcifications and has a small amount of internal blood flow peripherally. There are no abnormal appearing right axillary lymph nodes.  IMPRESSION: 1.2 cm right breast mass with imaging features suspicious for malignancy. Ultrasound-guided core needle biopsy is recommended and scheduled follow-up.  RECOMMENDATION: Right breast ultrasound-guided core needle biopsy (scheduled to follow).  I have discussed the findings and recommendations with the patient. Results were also provided in writing at the conclusion of the visit. If applicable, a reminder letter will be sent to the patient regarding the next appointment.  BI-RADS CATEGORY  4: Suspicious abnormality - biopsy should be considered.   Electronically Signed   By: Enrique Sack M.D.   On: 09/03/2013 11:49   [place] Rt Breast Bx W Loc Dev 1st Lesion Img Bx Spec US Guide  09/04/2013   ADDENDUM REPORT: 09/04/2013 13:11  ADDENDUM: PATHOLOGY ADDENDUM:  Pathology :  Breast, right, needle core biopsy, 8 o'clock  - INVASIVE DUCTAL CARCINOMA.  - DUCTAL CARCINOMA IN SITU.  Pathology concordance with imaging findings: Yes  Recommendation: Surgical consultation regarding treatment plan. The patient has an appointment with Dr. [name] on 09/09/2013. MRI has been scheduled for the patient on 09/11/2013. The patient will come to the breast center to pick up educational materials when she is able.  At the request of the patient, I spoke with her by telephone on  09/04/2013 at 13:09. She reports doing well after the biopsy.   Electronically Signed   By: Shon Hale M.D.   On: 09/04/2013 13:11   09/04/2013   CLINICAL DATA:  1.2 cm mass in the 8 o'clock position of the right breast with imaging features suspicious for malignancy.  EXAM: ULTRASOUND GUIDED RIGHT BREAST CORE NEEDLE BIOPSY WITH VACUUM ASSIST  COMPARISON:  Previous exams.  PROCEDURE: I met with the patient and we discussed the procedure of ultrasound-guided biopsy, including benefits and alternatives. We discussed the high likelihood of a successful procedure. We discussed the risks of the procedure including infection, bleeding, tissue injury, clip migration, and inadequate sampling. Informed written consent was given. The usual time-out protocol was performed immediately prior to the procedure.  Using sterile technique and 2% Lidocaine as local anesthetic, under direct ultrasound visualization, a 12 gauge vacuum-assisteddevice was used to perform biopsy of the recently demonstrated 1.2 cm mass in the 8 o'clock position of the right breast using an inferior approach. At the conclusion of the procedure, a tissue marker clip was deployed into the biopsy cavity. Follow-up 2-view mammogram was performed and dictated separately.  IMPRESSION: Ultrasound-guided biopsy of an 8 mm mass in the 8 o'clock position of the right breast. No apparent complications.  Electronically Signed: By: Enrique Sack M.D. On: 09/03/2013 11:51   Mm Digital Screening 3d Tomo  08/24/2013   CLINICAL DATA:  Screening.  EXAM: DIGITAL SCREENING BILATERAL MAMMOGRAM WITH 3D TOMO WITH CAD  COMPARISON:  Previous exam(s)  ACR Breast Density Category c: The breast tissue is heterogeneously dense, which may obscure small masses.  FINDINGS: In the right breast, a possible mass warrants further evaluation with  spot compression views and possibly ultrasound. In the left breast, no findings suspicious for malignancy. Images were processed with CAD.   IMPRESSION: Further evaluation is suggested for possible mass in the right breast.  RECOMMENDATION: Diagnostic mammogram and possibly ultrasound of the right breast. (Code:[id number])  The patient will be contacted regarding the findings, and additional imaging will be scheduled.  BI-RADS CATEGORY  0: Incomplete. Need additional imaging evaluation and/or prior mammograms for comparison.   Electronically Signed   By: Lillia Mountain M.D.   On: 08/24/2013 14:07      IMPRESSION/PLAN: This is a lovely 53 yo woman with  clinical T2NxMx Stage II Invasive Ductal Carcinoma with DCIS, Right breast, Grade III, ER 7%, PR 6%, Her2 neu +. Ki 67 70%.  Based on the MRI, further workup is warranted of the axilla and contralateral breast to complete her staging. Dr. Humphrey Rolls also needs to see her to discuss systemic therapy.    She may ultimately be a good candidate for breast conservation.  We discussed the risks, benefits, and side effects of radiotherapy in the setting of breast preservation.  Radiation could also be beneficial if mastectomy is ultimately pursued. We discussed that radiation would take approximately 4-6 weeks to complete; radiotherapy follows chemotherapy if chemotherapy is given.  Radiotherapy generally reduces risk of local recurrence by 2/3. We spoke about acute effects including skin irritation and fatigue as well as much less common late effects including lung  irritation. We spoke about the latest technology that is used to minimize the risk of late effects for breast cancer patients undergoing radiotherapy. No guarantees of treatment were given. The patient is enthusiastic about proceeding with treatment. I look forward to participating in the her care.  I spent 30 minutes  face to face with the patient and more than 50% of that time was spent in counseling and/or coordination of care.    __________________________________________   Eppie Gibson, MD

## 2013-09-16 ENCOUNTER — Other Ambulatory Visit: Payer: Self-pay | Admitting: Obstetrics

## 2013-09-16 ENCOUNTER — Other Ambulatory Visit (HOSPITAL_BASED_OUTPATIENT_CLINIC_OR_DEPARTMENT_OTHER): Payer: BC Managed Care – PPO

## 2013-09-16 ENCOUNTER — Ambulatory Visit
Admission: RE | Admit: 2013-09-16 | Discharge: 2013-09-16 | Disposition: A | Discharge status: Home / Self Care | Payer: BC Managed Care – PPO | Source: Ambulatory Visit | Attending: Obstetrics | Admitting: Obstetrics

## 2013-09-16 ENCOUNTER — Encounter: Payer: Self-pay | Admitting: *Deleted

## 2013-09-16 ENCOUNTER — Ambulatory Visit (HOSPITAL_BASED_OUTPATIENT_CLINIC_OR_DEPARTMENT_OTHER): Payer: BC Managed Care – PPO | Admitting: Oncology

## 2013-09-16 ENCOUNTER — Encounter: Payer: Self-pay | Admitting: Oncology

## 2013-09-16 ENCOUNTER — Ambulatory Visit: Payer: BC Managed Care – PPO

## 2013-09-16 VITALS — BP 155/82 | HR 93 | Temp 99.4°F | Resp 18 | Ht 64.0 in | Wt 172.5 lb

## 2013-09-16 DIAGNOSIS — R928 Other abnormal and inconclusive findings on diagnostic imaging of breast: Secondary | ICD-10-CM

## 2013-09-16 DIAGNOSIS — C50419 Malignant neoplasm of upper-outer quadrant of unspecified female breast: Secondary | ICD-10-CM

## 2013-09-16 DIAGNOSIS — C50519 Malignant neoplasm of lower-outer quadrant of unspecified female breast: Secondary | ICD-10-CM

## 2013-09-16 DIAGNOSIS — Z170 Estrogen receptor positive status [ER+]: Secondary | ICD-10-CM

## 2013-09-16 DIAGNOSIS — Z803 Family history of malignant neoplasm of breast: Secondary | ICD-10-CM

## 2013-09-16 LAB — CBC WITH DIFFERENTIAL/PLATELET
BASO%: 0.2 % (ref 0.0–2.0)
Basophils Absolute: 0 10*3/uL (ref 0.0–0.1)
EOS ABS: 0 10*3/uL (ref 0.0–0.5)
EOS%: 0.7 % (ref 0.0–7.0)
HEMATOCRIT: 39.6 % (ref 34.8–46.6)
HEMOGLOBIN: 13.8 g/dL (ref 11.6–15.9)
LYMPH#: 2.3 10*3/uL (ref 0.9–3.3)
LYMPH%: 46.6 % (ref 14.0–49.7)
MCH: 32 pg (ref 25.1–34.0)
MCHC: 34.8 g/dL (ref 31.5–36.0)
MCV: 92.1 fL (ref 79.5–101.0)
MONO#: 0.2 10*3/uL (ref 0.1–0.9)
MONO%: 3.4 % (ref 0.0–14.0)
NEUT#: 2.4 10*3/uL (ref 1.5–6.5)
NEUT%: 49.1 % (ref 38.4–76.8)
Platelets: 276 10*3/uL (ref 145–400)
RBC: 4.29 10*6/uL (ref 3.70–5.45)
RDW: 12.2 % (ref 11.2–14.5)
WBC: 4.9 10*3/uL (ref 3.9–10.3)

## 2013-09-16 LAB — COMPREHENSIVE METABOLIC PANEL (CC13)
ALT: 20 U/L (ref 0–55)
ANION GAP: 12 meq/L — AB (ref 3–11)
AST: 22 U/L (ref 5–34)
Albumin: 4 g/dL (ref 3.5–5.0)
Alkaline Phosphatase: 50 U/L (ref 40–150)
BUN: 10.7 mg/dL (ref 7.0–26.0)
CHLORIDE: 103 meq/L (ref 98–109)
CO2: 28 meq/L (ref 22–29)
Calcium: 10.1 mg/dL (ref 8.4–10.4)
Creatinine: 0.9 mg/dL (ref 0.6–1.1)
GLUCOSE: 116 mg/dL (ref 70–140)
Potassium: 3.3 mEq/L — ABNORMAL LOW (ref 3.5–5.1)
Sodium: 142 mEq/L (ref 136–145)
TOTAL PROTEIN: 8.4 g/dL — AB (ref 6.4–8.3)
Total Bilirubin: 0.73 mg/dL (ref 0.20–1.20)

## 2013-09-16 NOTE — Progress Notes (Signed)
Checked in new patient with no financial issues.she has not been to [place] and she has her appt card. She has her [facility] packet.

## 2013-09-16 NOTE — Progress Notes (Signed)
Completed chart, Varney Biles entered labs, added to spreadsheet and placed in Dr. Laurelyn Sickle box.

## 2013-09-16 NOTE — Progress Notes (Signed)
Tina [name] [id number] 14-Feb-1961 53 y.o. 09/16/2013 3:40 PM  CC  [name], DO [address] [place] [address] Dr. Rolm Bookbinder Dr. Dr. Eppie Gibson  REASON FOR CONSULTATION:  53 year old female with new diagnosis of HER-2 positive right breast cancer  STAGE:  Malignant neoplasm of lower-outer quadrant of female breast   Primary site: Breast (Right)   Staging method: AJCC 7th Edition   Clinical: Stage IIA (T2, NX, cM0) signed by [name], MD on 09/20/2013  9:50 PM   Summary: Stage IIA (T2, NX, cM0)    REFERRING PHYSICIAN: Dr. Rolm Bookbinder  HISTORY OF PRESENT ILLNESS:  Tina [name] is a 53 y.o. female.   Underwent screening mammogram. On the right side she was noted to have a mass with microcalcifications. Ultrasound in February 2015 showed a 1.2 x 1.1 x 1.0 cm rounded hypoechoic mass with irregular margins in the 8:00 position 4 cm from the nipple. No abnormal axillary lymph nodes were noted. She had MRI of the breasts performed that showed in the lower outer right breast 2.2 x 1.3 x 1.5 cm  Irregular mass. In the upper outer left breast there was a 7 x 5 x 8 cm indeterminate enhancing mass. Also another 9 x 7 x 6 mm indeterminant enhancing mass at 9:00 in the left breast was noted.there are also noted to be ominent cortices of right axillary lymph nodes. Patient has undergone biopsy of these. On the right patient had biopsy performed of the 1.2 cm mass. The pathology revealed grade 3 invasive ductal carcinoma with ductal carcinoma in situ. Tumor was ER +70% PR +6% HER-2/neu was amplified and proliferation marker Ki-67 70%. Patient   Has been seen by Dr. Rolm Bookbinder and has been recommended breast conserving surgery. She is also seen Dr. Eppie Gibson from radiation oncology. She is now seen in medical oncology for discussion of systemic  Treatment. She is accompanied by her husband as well as her 2 sons. She is without any complaints. We did  discuss both her pathology and radiology in detail today.   Past Medical History: Past Medical History  Diagnosis Date  . Leukocytopenia   . Hypertension   . Degenerative joint disease   . Leukopenia 06/16/2013  . Needs flu shot 06/17/2013  . Cancer of right breast 09/03/13    Invasive Ductal Carcinoma/Ductal Carcinoma Insitu    Past Surgical History: Past Surgical History  Procedure Laterality Date  . Cesarean section    . Carpal tunnel release    . Trigger finger release      Family History: Family History  Problem Relation Age of Onset  . Hypertension Mother   . Dementia Mother     small vessel disease  . Cancer Mother     cervical  cancer  . Cancer Father     prostate ca  . Diabetes Father   . Arthritis Father     Social History History  Substance Use Topics  . Smoking status: Former Smoker -- 1.00 packs/day for 15 years    Types: Cigars, Cigarettes  . Smokeless tobacco: Never Used     Comment: remote smoking history  . Alcohol Use: 0.0 oz/week    0 Shots of liquor per week     Comment: occ     Allergies: No Known Allergies  Current Medications: Current Outpatient Prescriptions  Medication Sig Dispense Refill  . ascorbic acid (VITAMIN C) 1000 MG tablet Take 1,000 mg by mouth daily.      Marland Kitchen  aspirin 81 MG tablet Take 81 mg by mouth daily.      Marland Kitchen BIOTIN 5000 PO Take by mouth.      . calcium carbonate 200 MG capsule Take 250 mg by mouth 2 (two) times daily with a meal.      . cholecalciferol (VITAMIN D) 1000 UNITS tablet Take 1,000 Units by mouth daily. Takes 10,000 units daily      . Coenzyme Q10 (CO Q 10 PO) Take by mouth daily.      . L-ARGININE PO Take by mouth daily.      Marland Kitchen lisinopril-hydrochlorothiazide (PRINZIDE,ZESTORETIC) 20-12.5 MG per tablet Take 1 tablet by mouth 2 (two) times daily.      Marland Kitchen OVER THE COUNTER MEDICATION Trace Mineral      . VITAMIN E PO Take by mouth daily.       No current facility-administered medications for this visit.     OB/GYN History: menarche at 40, pre-menopausal/perimenopausal, G3P3 first child at 73, no HRT,   Fertility Discussion: n/a Prior History of Cancer: no  Health Maintenance:  Colonoscopy no Bone Density no Last PAP smear yes  ECOG PERFORMANCE STATUS: 0 - Asymptomatic  Genetic Counseling/testing:  yes  REVIEW OF SYSTEMS:  A comprehensive review of systems was negative.  PHYSICAL EXAMINATION: Blood pressure 155/82, pulse 93, temperature 99.4 F (37.4 C), temperature source Oral, resp. rate 18, height '5\' 4"'  (1.626 m), weight 172 lb 8 oz (78.245 kg), last menstrual period 06/25/2013.  General:  well-nourished in no acute distress.  Eyes:  no scleral icterus.  ENT:  There were no oropharyngeal lesions.  Neck was without thyromegaly.  Lymphatics:  Negative cervical, supraclavicular or axillary adenopathy.  Respiratory: lungs were clear bilaterally without wheezing or crackles.  Cardiovascular:  Regular rate and rhythm, S1/S2, without murmur, rub or gallop.  There was no pedal edema.  GI:  abdomen was soft, flat, nontender, nondistended, without organomegaly.  Muscoloskeletal:  no spinal tenderness of palpation of vertebral spine.  Skin exam was without echymosis, petichae.  Neuro exam was nonfocal.  Patient was able to get on and off exam table without assistance.  Gait was normal.  Patient was alerted and oriented.  Attention was good.   Language was appropriate.  Mood was normal without depression.  Speech was not pressured.  Thought content was not tangential.   Breasts: right breast normal without mass, skin or nipple changes or axillary nodes, left breast normal without mass, skin or nipple changes or axillary nodes.   STUDIES/RESULTS: Dg Eye Foreign Body  09/11/2013   CLINICAL 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  FINDINGS: Water's views with eyes deviated toward the left and toward the right were obtained.  No radiopaque foreign body in the orbits identified. Paranasal sinuses are clear except for a small right frontal osteoma. No fracture or dislocation.  IMPRESSION: No evidence of metallic foreign body within the orbits. Small right frontal sinus osteoma, a benign finding.   Electronically Signed   By: Lowella Grip M.D.   On: 09/11/2013 09:23   Mr Breast Bilateral W Wo Contrast  09/14/2013   ADDENDUM REPORT: 09/14/2013 08:23  ADDENDUM: The mass in the upper outer left breast, middle depth, measures 7 x 5 x 8 mm, not 7 x 5 x 8 cm.   Electronically Signed   By: Enrique Sack M.D.   On: 09/14/2013 08:23   09/14/2013   CLINICAL  DATA:  New diagnosis of invasive ductal carcinoma and ductal carcinoma in situ in the right breast at 8 o'clock.  EXAM: BILATERAL BREAST MRI WITH AND WITHOUT CONTRAST  LABS:  BUN and creatinine were obtained on site at [facility] at  [address]  Results:  BUN 9 mg/dL,  Creatinine 0.7 mg/dL.  TECHNIQUE: Multiplanar, multisequence MR images of both breasts were obtained prior to and following the intravenous administration of 72m of MultiHance.  THREE-DIMENSIONAL MR IMAGE RENDERING ON INDEPENDENT WORKSTATION:  Three-dimensional MR images were rendered by post-processing of the original MR data on an independent workstation. The three-dimensional MR images were interpreted, and findings are reported in the following complete MRI report for this study. Three dimensional images were evaluated at the independent DynaCad workstation  COMPARISON:  Previous exams  FINDINGS: Breast composition: c:  Heterogeneous fibroglandular tissue  Background parenchymal enhancement: Moderate  Right breast: In the lower outer right breast, there is an irregular enhancing mass measuring 2.2 x 1.3 by 1.5 cm. This is consistent with the recently biopsied invasive ductal carcinoma. Within this mass is susceptibility artifact, consistent with biopsy clip. No additional areas of abnormal enhancement  are identified  Left breast: In the upper-outer left breast, middle depth, there is an oval circumscribed mass measuring 7 x 5 x 8 cm. This mass has mixed kinetics, which includes washout kinetics. At 9 o'clock in the left breast, there is an oval enhancing mass measuring 9 x 7 x 6 mm. There is associated plateau kinetics.  Lymph nodes: Right axillary lymph nodes with prominent cortices are identified, with the largest node measuring up to 1 cm in short axis.  Ancillary findings:  None.  IMPRESSION: 1. A 2.2 x 1.3 x 1.5 cm irregular mass in lower outer right breast consistent with the recently biopsied invasive ductal carcinoma. 2. A 7 x 5 x 8 cm indeterminate enhancing mass in the upper-outer left breast. 3. A 9 x 7 x 6 mm indeterminate enhancing mass at 9 o'clock in the left breast. 4. Prominent cortices of right axillary lymph nodes.  RECOMMENDATION: Ultrasound is recommended for evaluation of the masses in the left breast. If seen sonographically, ultrasound-guided biopsies are recommended. If these masses are not seen on ultrasound, MRI guided biopsies are recommended. Second-look ultrasound of the right axilla is also recommended given the MRI finding of right axillary nodes with prominent cortices.  BI-RADS CATEGORY  4: Suspicious abnormality - biopsy should be considered.  Electronically Signed: By: MDonavan BurnetM.D. On: 09/11/2013 14:26   Mm Digital Diagnostic Unilat L  09/16/2013   CLINICAL DATA:  Patient status post ultrasound-guided core needle biopsy left breast mass  EXAM: DIAGNOSTIC LEFT MAMMOGRAM POST ULTRASOUND BIOPSY  COMPARISON:  Previous exams  FINDINGS: Mammographic images were obtained following ultrasound guided biopsy of left breast mass. Wing shaped biopsy marking clip within the upper-outer quadrant of the left breast is in appropriate position.  IMPRESSION: Appropriate position biopsy marking clip left breast status post ultrasound-guided core needle biopsy.  Final Assessment:  Post Procedure Mammograms for Marker Placement   Electronically Signed   By: DLovey NewcomerM.D.   On: 09/16/2013 15:12   Mm Digital Diagnostic Unilat R  09/03/2013   CLINICAL DATA:  Status post ultrasound-guided core needle biopsy of a 1.2 cm mass in the 8 o'clock position of the right breast.  EXAM: POST-BIOPSY CLIP PLACEMENT LEFT DIAGNOSTIC MAMMOGRAM  COMPARISON:  Previous exams.  FINDINGS: Films are performed following ultrasound guided biopsy of  a 1.2 cm mass in the 8 o'clock position of the right breast. These demonstrate a ribbon shaped biopsy marker clip in the anterior aspect of the mass.  IMPRESSION: Appropriate clip deployment following right breast ultrasound-guided core needle biopsy.  Final Assessment: Post Procedure Mammograms for Marker Placement   Electronically Signed   By: Enrique Sack M.D.   On: 09/03/2013 12:13   Mm Digital Diagnostic Unilat R  09/03/2013   CLINICAL DATA:  Possible right breast mass at recent screening mammography.  EXAM: DIGITAL DIAGNOSTIC  RIGHT MAMMOGRAM  ULTRASOUND RIGHT BREAST  COMPARISON:  Previous examinations, including the screening mammogram dated 08/21/2013.  ACR Breast Density Category c: The breast tissue is heterogeneously dense, which may obscure small masses.  FINDINGS: Spot compression and spot magnification views of the right breast demonstrate an oval, poorly defined mass deep in the lower outer quadrant of the breast. This contains small, poorly defined microcalcifications.  On physical exam, no mass is palpable in the lower outer right breast or right axilla.  Ultrasound is performed, showing a 1.2 x 1.1 x 1.0 cm rounded, hypoechoic mass with mildly irregular margins in the 8 o'clock position of the right breast, 4 cm from the nipple. Some of the margins are circumscribed and others are not as well defined. The mass contains microcalcifications and has a small amount of internal blood flow peripherally. There are no abnormal appearing right axillary lymph  nodes.  IMPRESSION: 1.2 cm right breast mass with imaging features suspicious for malignancy. Ultrasound-guided core needle biopsy is recommended and scheduled follow-up.  RECOMMENDATION: Right breast ultrasound-guided core needle biopsy (scheduled to follow).  I have discussed the findings and recommendations with the patient. Results were also provided in writing at the conclusion of the visit. If applicable, a reminder letter will be sent to the patient regarding the next appointment.  BI-RADS CATEGORY  4: Suspicious abnormality - biopsy should be considered.   Electronically Signed   By: Enrique Sack M.D.   On: 09/03/2013 11:49   US Breast Ltd Uni Left Inc Axilla  09/16/2013   CLINICAL DATA:  Patient with recent diagnosis right breast carcinoma. Suspicious enhancing mass within the upper-outer quadrant left breast on prior MRI. Additional questioned mass within the 9 o'clock position left breast on prior MRI.  EXAM: ULTRASOUND OF THE LEFT BREAST  COMPARISON:  Breast MRI 09/14/2013; mammograms dating back to 10/11/2008.  FINDINGS: On physical exam,I palpate no discrete mass within the upper-outer quadrant of the left breast or within the 9 o'clock position of the left breast.  Ultrasound is performed, showing a 0.6 x 0.5 x 0.9 cm oval circumscribed hypoechoic mass within the left breast 2 o'clock position 4 cm from the nipple corresponding with enhancing mass identified on prior breast MRI.  No concerning mass identified within the left breast 9 o'clock position to correspond with the questioned enhancing tissue in this location on prior MRI.  IMPRESSION: 1. Hypoechoic mass within the upper-outer quadrant left breast corresponds with MRI finding. Ultrasound-guided core needle biopsy will be performed 09/16/2013. 2. No concerning lesion identified within the 9 o'clock position left breast on ultrasound. Upon additional review of the left MRI, in agreement with colleagues, this previously described area is felt  to represent normal fibroglandular breast tissue.  RECOMMENDATION: Ultrasound-guided core needle biopsy left breast mass.  I have discussed the findings and recommendations with the patient. Results were also provided in writing at the conclusion of the visit. If applicable, a reminder letter will be  sent to the patient regarding the next appointment.  BI-RADS CATEGORY  4: Suspicious abnormality - biopsy should be considered.   Electronically Signed   By: Lovey Newcomer M.D.   On: 09/16/2013 15:10   US Breast Ltd Uni Right Inc Axilla  09/16/2013   CLINICAL DATA:  Patient with recent diagnosis right breast carcinoma. Borderline enlarged right axillary lymph node on breast MRI.  EXAM: ULTRASOUND OF THE RIGHT AXILLA  COMPARISON:  Breast MRI 09/14/2013; mammograms dating back to 10/11/2008.  FINDINGS: On physical exam,I palpate no definite mass within the right axillary region.  Ultrasound is performed, showing a cortically thickened right axillary lymph node corresponding with lymph node identified on breast MRI.  IMPRESSION: Right axillary lymph node with cortical thickening and patient with recent diagnosis of right breast carcinoma.  RECOMMENDATION: Ultrasound-guided core needle biopsy thickened right axillary lymph node. This will be performed 09/16/2013.  I have discussed the findings and recommendations with the patient. Results were also provided in writing at the conclusion of the visit. If applicable, a reminder letter will be sent to the patient regarding the next appointment.  BI-RADS CATEGORY  4: Suspicious abnormality - biopsy should be considered.   Electronically Signed   By: Lovey Newcomer M.D.   On: 09/16/2013 15:04   US Breast Ltd Uni Right Inc Axilla  09/03/2013   CLINICAL DATA:  Possible right breast mass at recent screening mammography.  EXAM: DIGITAL DIAGNOSTIC  RIGHT MAMMOGRAM  ULTRASOUND RIGHT BREAST  COMPARISON:  Previous examinations, including the screening mammogram dated 08/21/2013.  ACR  Breast Density Category c: The breast tissue is heterogeneously dense, which may obscure small masses.  FINDINGS: Spot compression and spot magnification views of the right breast demonstrate an oval, poorly defined mass deep in the lower outer quadrant of the breast. This contains small, poorly defined microcalcifications.  On physical exam, no mass is palpable in the lower outer right breast or right axilla.  Ultrasound is performed, showing a 1.2 x 1.1 x 1.0 cm rounded, hypoechoic mass with mildly irregular margins in the 8 o'clock position of the right breast, 4 cm from the nipple. Some of the margins are circumscribed and others are not as well defined. The mass contains microcalcifications and has a small amount of internal blood flow peripherally. There are no abnormal appearing right axillary lymph nodes.  IMPRESSION: 1.2 cm right breast mass with imaging features suspicious for malignancy. Ultrasound-guided core needle biopsy is recommended and scheduled follow-up.  RECOMMENDATION: Right breast ultrasound-guided core needle biopsy (scheduled to follow).  I have discussed the findings and recommendations with the patient. Results were also provided in writing at the conclusion of the visit. If applicable, a reminder letter will be sent to the patient regarding the next appointment.  BI-RADS CATEGORY  4: Suspicious abnormality - biopsy should be considered.   Electronically Signed   By: Enrique Sack M.D.   On: 09/03/2013 11:49   [place] Lt Breast Bx W Loc Dev 1st Lesion Img Bx Spec US Guide  09/16/2013   CLINICAL DATA:  Patient with recent diagnosis right breast carcinoma. Suspicious mass within the upper-outer quadrant left breast on prior MRI and identified on ultrasound.  EXAM: ULTRASOUND GUIDED LEFT BREAST CORE NEEDLE BIOPSY WITH VACUUM ASSIST  COMPARISON:  Previous exams.  PROCEDURE: I met with the patient and we discussed the procedure of ultrasound-guided biopsy, including benefits and alternatives. We  discussed the high likelihood of a successful procedure. We discussed the risks of the procedure  including infection, bleeding, tissue injury, clip migration, and inadequate sampling. Informed written consent was given. The usual time-out protocol was performed immediately prior to the procedure.  Using sterile technique and 2% Lidocaine as local anesthetic, under direct ultrasound visualization, a 12 gauge vacuum-assisteddevice was used to perform biopsy of a hypoechoic mass within the left breast using a lateral approach. At the conclusion of the procedure, a wing shaped tissue marker clip was deployed into the biopsy cavity. Follow-up 2-view mammogram was performed and dictated separately.  IMPRESSION: Ultrasound-guided biopsy of left breast mass. No apparent complications.   Electronically Signed   By: Lovey Newcomer M.D.   On: 09/16/2013 15:11   [place] Rt Breast Bx W Loc Dev 1st Lesion Img Bx Spec US Guide  09/16/2013   CLINICAL DATA:  Patient with recent diagnosis right breast carcinoma. Cortically thickened right axillary lymph node on prior MRI and ultrasound.  EXAM: ULTRASOUND GUIDED CORE NEEDLE BIOPSY OF A RIGHT AXILLARY NODE  COMPARISON:  Previous exams.  FINDINGS: I met with the patient and we discussed the procedure of ultrasound-guided biopsy, including benefits and alternatives. We discussed the high likelihood of a successful procedure. We discussed the risks of the procedure, including infection, bleeding, tissue injury, clip migration, and inadequate sampling. Informed written consent was given. The usual time-out protocol was performed immediately prior to the procedure.  Using sterile technique and 2% Lidocaine as local anesthetic, under direct ultrasound visualization, a 14 gauge spring-loaded device was used to perform biopsy of a cortically thickened right axillary lymph node using a lateral approach.  IMPRESSION: Ultrasound guided biopsy of cortically thickened right axillary lymph node. No  apparent complications.   Electronically Signed   By: Lovey Newcomer M.D.   On: 09/16/2013 15:05   [place] Rt Breast Bx W Loc Dev 1st Lesion Img Bx Spec US Guide  09/04/2013   ADDENDUM REPORT: 09/04/2013 13:11  ADDENDUM: PATHOLOGY ADDENDUM:  Pathology :  Breast, right, needle core biopsy, 8 o'clock  - INVASIVE DUCTAL CARCINOMA.  - DUCTAL CARCINOMA IN SITU.  Pathology concordance with imaging findings: Yes  Recommendation: Surgical consultation regarding treatment plan. The patient has an appointment with Dr. [name] on 09/09/2013. MRI has been scheduled for the patient on 09/11/2013. The patient will come to the breast center to pick up educational materials when she is able.  At the request of the patient, I spoke with her by telephone on 09/04/2013 at 13:09. She reports doing well after the biopsy.   Electronically Signed   By: Shon Hale M.D.   On: 09/04/2013 13:11   09/04/2013   CLINICAL DATA:  1.2 cm mass in the 8 o'clock position of the right breast with imaging features suspicious for malignancy.  EXAM: ULTRASOUND GUIDED RIGHT BREAST CORE NEEDLE BIOPSY WITH VACUUM ASSIST  COMPARISON:  Previous exams.  PROCEDURE: I met with the patient and we discussed the procedure of ultrasound-guided biopsy, including benefits and alternatives. We discussed the high likelihood of a successful procedure. We discussed the risks of the procedure including infection, bleeding, tissue injury, clip migration, and inadequate sampling. Informed written consent was given. The usual time-out protocol was performed immediately prior to the procedure.  Using sterile technique and 2% Lidocaine as local anesthetic, under direct ultrasound visualization, a 12 gauge vacuum-assisteddevice was used to perform biopsy of the recently demonstrated 1.2 cm mass in the 8 o'clock position of the right breast using an inferior approach. At the conclusion of the procedure, a tissue marker clip was deployed  into the biopsy cavity. Follow-up 2-view  mammogram was performed and dictated separately.  IMPRESSION: Ultrasound-guided biopsy of an 8 mm mass in the 8 o'clock position of the right breast. No apparent complications.  Electronically Signed: By: Enrique Sack M.D. On: 09/03/2013 11:51   Mm Digital Screening 3d Tomo  08/24/2013   CLINICAL DATA:  Screening.  EXAM: DIGITAL SCREENING BILATERAL MAMMOGRAM WITH 3D TOMO WITH CAD  COMPARISON:  Previous exam(s)  ACR Breast Density Category c: The breast tissue is heterogeneously dense, which may obscure small masses.  FINDINGS: In the right breast, a possible mass warrants further evaluation with spot compression views and possibly ultrasound. In the left breast, no findings suspicious for malignancy. Images were processed with CAD.  IMPRESSION: Further evaluation is suggested for possible mass in the right breast.  RECOMMENDATION: Diagnostic mammogram and possibly ultrasound of the right breast. (Code:[id number]-R-40M)  The patient will be contacted regarding the findings, and additional imaging will be scheduled.  BI-RADS CATEGORY  0: Incomplete. Need additional imaging evaluation and/or prior mammograms for comparison.   Electronically Signed   By: Lillia Mountain M.D.   On: 08/24/2013 14:07     LABS:    Chemistry      Component Value Date/Time   NA 142 09/16/2013 1455   NA 140 10/04/2012 1830   K 3.3* 09/16/2013 1455   K 4.0 10/04/2012 1830   CL 103 10/04/2012 1830   CL 104 06/16/2012 1303   CO2 28 09/16/2013 1455   CO2 24 10/04/2012 1830   BUN 10.7 09/16/2013 1455   BUN 16 10/04/2012 1830   CREATININE 0.9 09/16/2013 1455   CREATININE 0.74 10/04/2012 1830      Component Value Date/Time   CALCIUM 10.1 09/16/2013 1455   CALCIUM 9.5 10/04/2012 1830   ALKPHOS 50 09/16/2013 1455   ALKPHOS 42 10/04/2012 1830   AST 22 09/16/2013 1455   AST 32 10/04/2012 1830   ALT 20 09/16/2013 1455   ALT 27 10/04/2012 1830   BILITOT 0.73 09/16/2013 1455   BILITOT 0.7 10/04/2012 1830      Lab Results  Component Value Date   WBC 4.9  09/16/2013   HGB 13.8 09/16/2013   HCT 39.6 09/16/2013   MCV 92.1 09/16/2013   PLT 276 09/16/2013     PATHOLOGY as noted above   ASSESSMENT/PLAN: 53 year old female   #1 New diagnosis of stageII(T2 NX) invasive ductal carcinoma with DCIS of the right breast. Biopsy revealed tumor to be ER+PR+ HER-2/neu positive with proliferation marker Ki-67 70%   #2 We spent the better part of today's hour-long appointment discussing the biology of breast cancer in general, and the specifics of the patient's tumor in particular.Patient and I went over her pathology in detail today. We discussed the significance of the HER-2/neu receptor as well as the estrogen and progesterone receptors. We discussed her treatment options. Patient is a candidate for chemotherapy along with anti-HER-2 therapy such as Herceptin/perjeta  to be given neoadjuvantly in her case.we discussed the rationale as well as risks and benefits of chemotherapy as well as anti-HER-2 therapy. We discussed the neoadjuvant antiHer2 approach to treatment with chemotherapy and anti-HER-2 therapy. We also discussed role of adjuvant antiestrogen therapy. We discussed treatment with chemotherapy consisting of Taxotere and carboplatinum/Herceptin/perjeta to be given every 3 weeks for a total of 6 cycles. Once patient completes chemotherapy she will receive Herceptin every 3 weeks to finish out 1 year of Herceptin.  #3 we discussed genetic counseling and  testing due to patient's family history of breast cancer in her mother as well as her sister. We discussed the rationale for testing as well as implications of testing to her as well as her offspring spent other family members.  #4 we discussed the need for proceeding with a Port-A-Cath placement. We also discussed chemotherapy education class, echocardiogram to monitor patient's heart function throughout the HER-2 therapy. We also discussed role of cardiology in monitoring her heart function  #5 we  discussed staging scans to determine any distant systemic disease. We will plan to do PET/CT  #6 I will plan on seeing the patient back after she has had her port placed, chemotherapy class, and echocardiogram.    Discussion: Patient is being treated per NCCN breast cancer care guidelines appropriate for stage.II   Thank you so much for allowing me to participate in the care of Jlee Harkless. I will continue to follow up the patient with you and assist in her care.  All questions were answered. The patient knows to call the clinic with any problems, questions or concerns. We can certainly see the patient much sooner if necessary.  I spent 55 minutes counseling the patient face to face. The total time spent in the appointment was 60 minutes.  Marcy Panning, MD Medical/Oncology [facility] [phone] (beeper) [phone] (Office)  09/16/2013, 3:40 PM

## 2013-09-20 MED ORDER — PROCHLORPERAZINE MALEATE 10 MG PO TABS: 10.0000 mg | ORAL_TABLET | Freq: Four times a day (QID) | ORAL | Status: DC | PRN

## 2013-09-20 MED ORDER — LIDOCAINE-PRILOCAINE 2.5-2.5 % EX CREA: TOPICAL_CREAM | CUTANEOUS | Status: DC | PRN

## 2013-09-20 MED ORDER — DEXAMETHASONE 4 MG PO TABS: 8.0000 mg | ORAL_TABLET | Freq: Two times a day (BID) | ORAL | Status: DC

## 2013-09-20 MED ORDER — ONDANSETRON HCL 8 MG PO TABS: 8.0000 mg | ORAL_TABLET | Freq: Two times a day (BID) | ORAL | Status: DC

## 2013-09-20 MED ORDER — LORAZEPAM 0.5 MG PO TABS: 0.5000 mg | ORAL_TABLET | Freq: Four times a day (QID) | ORAL | Status: DC | PRN

## 2013-09-21 ENCOUNTER — Telehealth: Payer: Self-pay | Admitting: *Deleted

## 2013-09-21 ENCOUNTER — Other Ambulatory Visit: Payer: Self-pay | Admitting: *Deleted

## 2013-09-21 ENCOUNTER — Telehealth: Payer: Self-pay | Admitting: Oncology

## 2013-09-21 DIAGNOSIS — C50519 Malignant neoplasm of lower-outer quadrant of unspecified female breast: Secondary | ICD-10-CM

## 2013-09-21 MED ORDER — POTASSIUM CHLORIDE CRYS ER 20 MEQ PO TBCR
20.0000 meq | EXTENDED_RELEASE_TABLET | Freq: Once | ORAL | Status: DC
Start: 2013-09-21 — End: 2013-10-05

## 2013-09-21 NOTE — Telephone Encounter (Signed)
, °

## 2013-09-21 NOTE — Telephone Encounter (Signed)
As noted below by Dr. Humphrey Rolls, I informed patient that K+ was 3.3. Dr. Humphrey Rolls wants patient to have K-Dur 20 mEQ po daily x 5 days. Patient verbalized understanding. This prescription will be e-scribed to her pharmacy.

## 2013-09-21 NOTE — Telephone Encounter (Signed)
Message copied by Hebert Soho on Mon Sep 21, 2013  5:12 PM ------      Message from: [name]      Created: Wed Sep 16, 2013  5:19 PM       K-dur 20 meq po daily x 5 days            #5/0 ------

## 2013-09-21 NOTE — Telephone Encounter (Signed)
Per staff message and POF I have scheduled appts.  JMW  

## 2013-09-22 ENCOUNTER — Other Ambulatory Visit: Payer: Self-pay | Admitting: Oncology

## 2013-09-22 DIAGNOSIS — C50519 Malignant neoplasm of lower-outer quadrant of unspecified female breast: Secondary | ICD-10-CM

## 2013-09-23 ENCOUNTER — Telehealth: Payer: Self-pay | Admitting: Oncology

## 2013-09-23 ENCOUNTER — Encounter (HOSPITAL_COMMUNITY): Payer: Self-pay | Admitting: Pharmacist

## 2013-09-23 NOTE — Telephone Encounter (Signed)
appt already scheduled on 09/25/2013.

## 2013-09-24 ENCOUNTER — Other Ambulatory Visit: Payer: Self-pay | Admitting: Radiology

## 2013-09-24 ENCOUNTER — Other Ambulatory Visit: Payer: BC Managed Care – PPO

## 2013-09-24 ENCOUNTER — Telehealth: Payer: Self-pay | Admitting: *Deleted

## 2013-09-25 ENCOUNTER — Other Ambulatory Visit: Payer: Self-pay | Admitting: Oncology

## 2013-09-25 ENCOUNTER — Ambulatory Visit (HOSPITAL_COMMUNITY)
Admission: RE | Admit: 2013-09-25 | Discharge: 2013-09-25 | Disposition: A | Discharge status: Home / Self Care | Payer: BC Managed Care – PPO | Source: Ambulatory Visit | Attending: Oncology | Admitting: Oncology

## 2013-09-25 ENCOUNTER — Encounter (HOSPITAL_COMMUNITY): Payer: Self-pay

## 2013-09-25 DIAGNOSIS — D0590 Unspecified type of carcinoma in situ of unspecified breast: Secondary | ICD-10-CM | POA: Insufficient documentation

## 2013-09-25 DIAGNOSIS — Z8249 Family history of ischemic heart disease and other diseases of the circulatory system: Secondary | ICD-10-CM | POA: Insufficient documentation

## 2013-09-25 DIAGNOSIS — C50519 Malignant neoplasm of lower-outer quadrant of unspecified female breast: Secondary | ICD-10-CM

## 2013-09-25 DIAGNOSIS — I10 Essential (primary) hypertension: Secondary | ICD-10-CM | POA: Insufficient documentation

## 2013-09-25 DIAGNOSIS — Z833 Family history of diabetes mellitus: Secondary | ICD-10-CM | POA: Insufficient documentation

## 2013-09-25 DIAGNOSIS — Z79899 Other long term (current) drug therapy: Secondary | ICD-10-CM | POA: Insufficient documentation

## 2013-09-25 DIAGNOSIS — Z8049 Family history of malignant neoplasm of other genital organs: Secondary | ICD-10-CM | POA: Insufficient documentation

## 2013-09-25 DIAGNOSIS — Z87891 Personal history of nicotine dependence: Secondary | ICD-10-CM | POA: Insufficient documentation

## 2013-09-25 LAB — APTT: aPTT: 30 seconds (ref 24–37)

## 2013-09-25 LAB — BASIC METABOLIC PANEL
BUN: 9 mg/dL (ref 6–23)
CO2: 27 meq/L (ref 19–32)
Calcium: 10 mg/dL (ref 8.4–10.5)
Chloride: 98 mEq/L (ref 96–112)
Creatinine, Ser: 0.82 mg/dL (ref 0.50–1.10)
GFR calc Af Amer: >90 mL/min (ref >90)
GFR, EST NON AFRICAN AMERICAN: 82 mL/min — AB (ref >90)
GLUCOSE: 82 mg/dL (ref 70–99)
POTASSIUM: 3.6 meq/L — AB (ref 3.7–5.3)
SODIUM: 138 meq/L (ref 137–147)

## 2013-09-25 LAB — CBC
HCT: 37.2 % (ref 36.0–46.0)
HEMOGLOBIN: 13.2 g/dL (ref 12.0–15.0)
MCH: 31.7 pg (ref 26.0–34.0)
MCHC: 35.5 g/dL (ref 30.0–36.0)
MCV: 89.4 fL (ref 78.0–100.0)
Platelets: 243 10*3/uL (ref 150–400)
RBC: 4.16 MIL/uL (ref 3.87–5.11)
RDW: 12.1 % (ref 11.5–15.5)
WBC: 4 10*3/uL (ref 4.0–10.5)

## 2013-09-25 LAB — PROTIME-INR
INR: 0.97 (ref 0.00–1.49)
Prothrombin Time: 12.7 seconds (ref 11.6–15.2)

## 2013-09-25 MED ORDER — SODIUM CHLORIDE 0.9 % IV SOLN
INTRAVENOUS | Status: DC
Start: 2013-09-25 — End: 2013-09-26
  Administered 2013-09-25: 14:00:00 via INTRAVENOUS

## 2013-09-25 MED ORDER — HEPARIN SOD (PORK) LOCK FLUSH 100 UNIT/ML IV SOLN
INTRAVENOUS | Status: AC
  Filled 2013-09-25: qty 5

## 2013-09-25 MED ORDER — CEFAZOLIN SODIUM-DEXTROSE 2-3 GM-% IV SOLR
INTRAVENOUS | Status: AC
  Administered 2013-09-25: 2000 mg
  Filled 2013-09-25: qty 50

## 2013-09-25 MED ORDER — FENTANYL CITRATE 0.05 MG/ML IJ SOLN
INTRAMUSCULAR | Status: AC | PRN
  Administered 2013-09-25: 100 ug via INTRAVENOUS

## 2013-09-25 MED ORDER — MIDAZOLAM HCL 2 MG/2ML IJ SOLN
INTRAMUSCULAR | Status: AC
  Filled 2013-09-25: qty 4

## 2013-09-25 MED ORDER — HEPARIN SOD (PORK) LOCK FLUSH 100 UNIT/ML IV SOLN: 500.0000 [IU] | Freq: Once | INTRAVENOUS | Status: DC

## 2013-09-25 MED ORDER — FENTANYL CITRATE 0.05 MG/ML IJ SOLN
INTRAMUSCULAR | Status: AC
  Filled 2013-09-25: qty 4

## 2013-09-25 MED ORDER — MIDAZOLAM HCL 2 MG/2ML IJ SOLN
INTRAMUSCULAR | Status: AC | PRN
  Administered 2013-09-25: 1 mg via INTRAVENOUS
  Administered 2013-09-25: 2 mg via INTRAVENOUS
  Administered 2013-09-25: 1 mg via INTRAVENOUS

## 2013-09-25 MED ORDER — LIDOCAINE-EPINEPHRINE (PF) 2 %-1:200000 IJ SOLN
INTRAMUSCULAR | Status: AC
  Filled 2013-09-25: qty 20

## 2013-09-25 MED ORDER — CEFAZOLIN SODIUM-DEXTROSE 2-3 GM-% IV SOLR: 2.0000 g | INTRAVENOUS | Status: DC

## 2013-09-25 NOTE — H&P (Signed)
Tina [name] is an 53 y.o. female.   Chief Complaint: "I'm having a port a cath put in" HPI: Patient with history of stage IIA right breast carcinoma presents today for port a cath placement for chemotherapy.                                                                                                                                                                                        Past Medical History  Diagnosis Date  . Leukocytopenia   . Hypertension   . Degenerative joint disease   . Leukopenia 06/16/2013  . Needs flu shot 06/17/2013  . Cancer of right breast 09/03/13    Invasive Ductal Carcinoma/Ductal Carcinoma Insitu    Past Surgical History  Procedure Laterality Date  . Cesarean section    . Carpal tunnel release    . Trigger finger release      Family History  Problem Relation Age of Onset  . Hypertension Mother   . Dementia Mother     small vessel disease  . Cancer Mother     cervical  cancer  . Cancer Father     prostate ca  . Diabetes Father   . Arthritis Father    Social History:  reports that she has quit smoking. Her smoking use included Cigars and Cigarettes. She has a 15 pack-year smoking history. She has never used smokeless tobacco. She reports that she drinks alcohol. She reports that she uses illicit drugs.  Allergies: No Known Allergies  Current outpatient prescriptions:ascorbic acid (VITAMIN C) 1000 MG tablet, Take 1,000 mg by mouth daily., Disp: , Rfl: ;  aspirin 81 MG tablet, Take 81 mg by mouth daily., Disp: , Rfl: ;  BIOTIN 5000 PO, Take by mouth daily. , Disp: , Rfl: ;  Coenzyme Q10 (CO Q 10 PO), Take by mouth daily., Disp: , Rfl: ;  L-ARGININE PO, Take by mouth daily., Disp: , Rfl:  lisinopril-hydrochlorothiazide (PRINZIDE,ZESTORETIC) 20-12.5 MG per tablet, Take 1 tablet by mouth 2 (two) times daily., Disp: , Rfl: ;  OVER THE COUNTER MEDICATION, Take 1 tablet by mouth daily. Trace Minerals, Disp: , Rfl: ;  potassium chloride SA  (K-DUR,KLOR-CON) 20 MEQ tablet, Take 1 tablet (20 mEq total) by mouth once. X 5 days, Disp: 5 tablet, Rfl: 0;  Probiotic Product (TRUBIOTICS) CAPS, Take 1 capsule by mouth daily., Disp: , Rfl:  VITAMIN E PO, Take 1 capsule by mouth daily. , Disp: , Rfl: ;  Calcium Carbonate-Vit D-Min (CALTRATE 600+D PLUS MINERALS PO), Take 1 tablet by mouth daily., Disp: , Rfl: ;  Cholecalciferol (VITAMIN D3) 10000 UNITS capsule, Take 10,000 Units by mouth daily.,  Disp: , Rfl:  dexamethasone (DECADRON) 4 MG tablet, Take 2 tablets (8 mg total) by mouth 2 (two) times daily. Start the day before Taxotere. Then again the day after chemo for 3 days., Disp: 30 tablet, Rfl: 1;  lidocaine-prilocaine (EMLA) cream, Apply topically as needed., Disp: 30 g, Rfl: 6;  LORazepam (ATIVAN) 0.5 MG tablet, Take 1 tablet (0.5 mg total) by mouth every 6 (six) hours as needed (Nausea or vomiting)., Disp: 30 tablet, Rfl: 0 ondansetron (ZOFRAN) 8 MG tablet, Take 1 tablet (8 mg total) by mouth 2 (two) times daily. Start the day after chemo for 3 days. Then take as needed for nausea or vomiting., Disp: 30 tablet, Rfl: 1;  prochlorperazine (COMPAZINE) 10 MG tablet, Take 1 tablet (10 mg total) by mouth every 6 (six) hours as needed (Nausea or vomiting)., Disp: 30 tablet, Rfl: 1 Current facility-administered medications:0.9 %  sodium chloride infusion, , Intravenous, Continuous, D Kevin Chase Arnall, PA-C, Last Rate: 20 mL/hr at 09/25/13 1345;  ceFAZolin (ANCEF) IVPB 2 g/50 mL premix, 2 g, Intravenous, On Call, D Rowe Robert, PA-C   Results for orders placed during the hospital encounter of 09/25/13 (from the past 48 hour(s))  APTT     Status: None   Collection Time    09/25/13  1:20 PM      Result Value Ref Range   aPTT 30  24 - 37 seconds  BASIC METABOLIC PANEL     Status: Abnormal   Collection Time    09/25/13  1:20 PM      Result Value Ref Range   Sodium 138  137 - 147 mEq/L   Potassium 3.6 (*) 3.7 - 5.3 mEq/L   Chloride 98  96 - 112 mEq/L    CO2 27  19 - 32 mEq/L   Glucose, Bld 82  70 - 99 mg/dL   BUN 9  6 - 23 mg/dL   Creatinine, Ser 0.82  0.50 - 1.10 mg/dL   Calcium 10.0  8.4 - 10.5 mg/dL   GFR calc non Af Amer 82 (*) >90 mL/min   GFR calc Af Amer >90  >90 mL/min   Comment: (NOTE)     The eGFR has been calculated using the CKD EPI equation.     This calculation has not been validated in all clinical situations.     eGFR's persistently <90 mL/min signify possible Chronic Kidney     Disease.  CBC     Status: None   Collection Time    09/25/13  1:20 PM      Result Value Ref Range   WBC 4.0  4.0 - 10.5 K/uL   RBC 4.16  3.87 - 5.11 MIL/uL   Hemoglobin 13.2  12.0 - 15.0 g/dL   HCT 37.2  36.0 - 46.0 %   MCV 89.4  78.0 - 100.0 fL   MCH 31.7  26.0 - 34.0 pg   MCHC 35.5  30.0 - 36.0 g/dL   RDW 12.1  11.5 - 15.5 %   Platelets 243  150 - 400 K/uL  PROTIME-INR     Status: None   Collection Time    09/25/13  1:20 PM      Result Value Ref Range   Prothrombin Time 12.7  11.6 - 15.2 seconds   INR 0.97  0.00 - 1.49   No results found.  Review of Systems  Constitutional: Negative for fever and chills.  Respiratory: Negative for cough and shortness of breath.   Cardiovascular: Negative for  chest pain.  Gastrointestinal: Negative for nausea, vomiting and abdominal pain.  Musculoskeletal: Negative for back pain.  Neurological: Negative for headaches.  Endo/Heme/Allergies: Does not bruise/bleed easily.   Vitals: BP 130/91  HR 86  R 18  TEMP 97.7  O2 SATS  99% RA Last menstrual period 06/25/2013. Physical Exam  Constitutional: She is oriented to person, place, and time. She appears well-developed and well-nourished.  Cardiovascular: Normal rate and regular rhythm.   Respiratory: Effort normal and breath sounds normal.  GI: Soft. Bowel sounds are normal. There is no tenderness.  Musculoskeletal: Normal range of motion. She exhibits no edema.  Neurological: She is alert and oriented to person, place, and time.      Assessment/Plan Patient with history of stage IIA right breast carcinoma presents today for port a cath placement for chemotherapy. Details/risks of procedure d/w pt/husband with their understanding and consent.   Naszir Cott,D KEVIN 09/25/2013, 2:31 PM

## 2013-09-25 NOTE — Discharge Instructions (Signed)
Implanted Port Home Guide °An implanted port is a type of central line that is placed under the skin. Central lines are used to provide IV access when treatment or nutrition needs to be given through a person's veins. Implanted ports are used for long-term IV access. An implanted port may be placed because:  °· You need IV medicine that would be irritating to the small veins in your hands or arms.   °· You need long-term IV medicines, such as antibiotics.   °· You need IV nutrition for a long period.   °· You need frequent blood draws for lab tests.   °· You need dialysis.   °Implanted ports are usually placed in the chest area, but they can also be placed in the upper arm, the abdomen, or the leg. An implanted port has two main parts:  °· Reservoir. The reservoir is round and will appear as a small, raised area under your skin. The reservoir is the part where a needle is inserted to give medicines or draw blood.   °· Catheter. The catheter is a thin, flexible tube that extends from the reservoir. The catheter is placed into a large vein. Medicine that is inserted into the reservoir goes into the catheter and then into the vein.   °HOW WILL I CARE FOR MY INCISION SITE? °Do not get the incision site wet. Bathe or shower as directed by your health care provider.  °HOW IS MY PORT ACCESSED? °Special steps must be taken to access the port:  °· Before the port is accessed, a numbing cream can be placed on the skin. This helps numb the skin over the port site.   °· Your health care provider uses a sterile technique to access the port. °· Your health care provider must put on a mask and sterile gloves. °· The skin over your port is cleaned carefully with an antiseptic and allowed to dry. °· The port is gently pinched between sterile gloves, and a needle is inserted into the port. °· Only "non-coring" port needles should be used to access the port. Once the port is accessed, a blood return should be checked. This helps  ensure that the port is in the vein and is not clogged.   °· If your port needs to remain accessed for a constant infusion, a clear (transparent) bandage will be placed over the needle site. The bandage and needle will need to be changed every week, or as directed by your health care provider.   °· Keep the bandage covering the needle clean and dry. Do not get it wet. Follow your health care provider's instructions on how to take a shower or bath while the port is accessed.   °· If your port does not need to stay accessed, no bandage is needed over the port.   °WHAT IS FLUSHING? °Flushing helps keep the port from getting clogged. Follow your health care provider's instructions on how and when to flush the port. Ports are usually flushed with saline solution or a medicine called heparin. The need for flushing will depend on how the port is used.  °· If the port is used for intermittent medicines or blood draws, the port will need to be flushed:   °· After medicines have been given.   °· After blood has been drawn.   °· As part of routine maintenance.   °· If a constant infusion is running, the port may not need to be flushed.   °HOW LONG WILL MY PORT STAY IMPLANTED? °The port can stay in for as long as your health care   provider thinks it is needed. When it is time for the port to come out, surgery will be done to remove it. The procedure is similar to the one performed when the port was put in.  °WHEN SHOULD I SEEK IMMEDIATE MEDICAL CARE? °When you have an implanted port, you should seek immediate medical care if:  °· You notice a bad smell coming from the incision site.   °· You have swelling, redness, or drainage at the incision site.   °· You have more swelling or pain at the port site or the surrounding area.   °· You have a fever that is not controlled with medicine. °Document Released: 07/16/2005 Document Revised: 05/06/2013 Document Reviewed: 03/23/2013 °ExitCare® Patient Information ©2014 [employer]  [employer]. °Moderate Sedation, Adult °Moderate sedation is given to help you relax or even sleep through a procedure. You may remain sleepy, be clumsy, or have poor balance for several hours following this procedure. Arrange for a responsible adult, family member, or friend to take you home. A responsible adult should stay with you for at least 24 hours or until the medicines have worn off. °· Do not participate in any activities where you could become injured for the next 24 hours, or until you feel normal again. Do not: °· Drive. °· Swim. °· Ride a bicycle. °· Operate heavy machinery. °· Cook. °· Use power tools. °· Climb ladders. °· Work at heights. °· Do not make important decisions or sign legal documents until you are improved. °· Vomiting may occur if you eat too soon. When you can drink without vomiting, try water, juice, or soup. Try solid foods if you feel little or no nausea. °· Only take over-the-counter or prescription medications for pain, discomfort, or fever as directed by your caregiver.If pain medications have been prescribed for you, ask your caregiver how soon it is safe to take them. °· Make sure you and your family fully understands everything about the medication given to you. Make sure you understand what side effects may occur. °· You should not drink alcohol, take sleeping pills, or medications that cause drowsiness for at least 24 hours. °· If you smoke, do not smoke alone. °· If you are feeling better, you may resume normal activities 24 hours after receiving sedation. °· Keep all appointments as scheduled. Follow all instructions. °· Ask questions if you do not understand. °SEEK MEDICAL CARE IF:  °· Your skin is pale or bluish in color. °· You continue to feel sick to your stomach (nauseous) or throw up (vomit). °· Your pain is getting worse and not helped by medication. °· You have bleeding or swelling. °· You are still sleepy or feeling clumsy after 24 hours. °SEEK IMMEDIATE MEDICAL CARE IF:   °· You develop a rash. °· You have difficulty breathing. °· You develop any type of allergic problem. °· You have a fever. °Document Released: 04/10/2001 Document Revised: 10/08/2011 Document Reviewed: 03/23/2013 °ExitCare® Patient Information ©2014 [employer]. ° °

## 2013-09-25 NOTE — Addendum Note (Signed)
Encounter addended by: Lachae Hohler Mintz Ugochi Henzler, RN on: 09/25/2013 10:08 AM<BR>     Documentation filed: Charges VN

## 2013-09-25 NOTE — Procedures (Signed)
Successful placement of left IJ approach port-a-cath with tip at the superior caval atrial junction. The catheter is ready for immediate use. No immediate post procedural complications. 

## 2013-09-28 ENCOUNTER — Ambulatory Visit (HOSPITAL_COMMUNITY)
Admission: RE | Admit: 2013-09-28 | Discharge: 2013-09-28 | Disposition: A | Discharge status: Home / Self Care | Payer: BC Managed Care – PPO | Source: Ambulatory Visit | Attending: Radiology | Admitting: Radiology

## 2013-09-28 ENCOUNTER — Ambulatory Visit (HOSPITAL_COMMUNITY)
Admission: RE | Admit: 2013-09-28 | Discharge: 2013-09-28 | Disposition: A | Discharge status: Home / Self Care | Payer: BC Managed Care – PPO | Source: Ambulatory Visit | Attending: Adult Health | Admitting: Adult Health

## 2013-09-28 ENCOUNTER — Other Ambulatory Visit (HOSPITAL_COMMUNITY): Payer: Self-pay | Admitting: Radiology

## 2013-09-28 ENCOUNTER — Telehealth: Payer: Self-pay | Admitting: *Deleted

## 2013-09-28 ENCOUNTER — Other Ambulatory Visit: Payer: Self-pay | Admitting: *Deleted

## 2013-09-28 ENCOUNTER — Other Ambulatory Visit (HOSPITAL_COMMUNITY): Payer: Self-pay | Admitting: Interventional Radiology

## 2013-09-28 DIAGNOSIS — C50919 Malignant neoplasm of unspecified site of unspecified female breast: Secondary | ICD-10-CM | POA: Insufficient documentation

## 2013-09-28 DIAGNOSIS — C50519 Malignant neoplasm of lower-outer quadrant of unspecified female breast: Secondary | ICD-10-CM

## 2013-09-28 DIAGNOSIS — R079 Chest pain, unspecified: Secondary | ICD-10-CM | POA: Insufficient documentation

## 2013-09-28 NOTE — Procedures (Signed)
Pt is a 53 year old BF with hx breast cancer and recently placed left IJ PAC on 09/25/13. Procedure was performed without immediate complications. Pt called IR office today to report feeling of fullness in left lateral /ant neck region which is exacerbated with bending forward. She states symptoms began over past weekend. She denies acute substernal pain, fevers, N/V or diaphoresis. She has taken ibuprofen with some relief of symptoms. On exam today, both port and left IJ access sites are clean and dry with minimal tenderness, no evidence of erythema/edema/drainage. Chest is CTA bilat, heart-RRR. Limited US of left IJ shows no evidence of thrombosis but diameter is small. Images were reviewed by Dr. Kathlene Cote. At this time recommend conservative care and avoidance of activities that exacerbate symptoms.  Pt is scheduled for CTC/A/P on 09/30/13 and f/u with with Dr. Humphrey Rolls on 3/6. Pt was told to contact oncology or IR service in interim should symptoms worsen, especially with regards to any upper extremity/facial/neck/upper chest edema.

## 2013-09-28 NOTE — Telephone Encounter (Signed)
Received call from patient that she has had a "funny feeling" over the weekend regarding her port-a-cath. Patient stated, "I had an episode of chest pain this morning and some shortness of breath when I change positions. When I cough, laugh or sneeze or change positions, like from sitting to standing, I feel funny." There was no chest pain or shortness of breath over the weekend. Per Mendel Ryder, NP, lets get a chest xray for tip placement. Orders entered. Radiology aware. Patient aware to come to [facility] for chest xray. Patient verbalized understanding.

## 2013-09-29 ENCOUNTER — Ambulatory Visit (HOSPITAL_COMMUNITY): Payer: BC Managed Care – PPO | Attending: Family Medicine | Admitting: Cardiology

## 2013-09-29 DIAGNOSIS — R011 Cardiac murmur, unspecified: Secondary | ICD-10-CM | POA: Insufficient documentation

## 2013-09-29 NOTE — Progress Notes (Signed)
Echo performed. 

## 2013-09-30 ENCOUNTER — Encounter (HOSPITAL_COMMUNITY): Payer: Self-pay

## 2013-09-30 ENCOUNTER — Telehealth: Payer: Self-pay | Admitting: *Deleted

## 2013-09-30 ENCOUNTER — Encounter (HOSPITAL_COMMUNITY)
Admission: RE | Admit: 2013-09-30 | Discharge: 2013-09-30 | Disposition: A | Discharge status: Home / Self Care | Payer: BC Managed Care – PPO | Source: Ambulatory Visit | Attending: Oncology | Admitting: Oncology

## 2013-09-30 ENCOUNTER — Ambulatory Visit (HOSPITAL_COMMUNITY)
Admission: RE | Admit: 2013-09-30 | Discharge: 2013-09-30 | Disposition: A | Discharge status: Home / Self Care | Payer: BC Managed Care – PPO | Source: Ambulatory Visit | Attending: Oncology | Admitting: Oncology

## 2013-09-30 DIAGNOSIS — R079 Chest pain, unspecified: Secondary | ICD-10-CM | POA: Insufficient documentation

## 2013-09-30 DIAGNOSIS — C50519 Malignant neoplasm of lower-outer quadrant of unspecified female breast: Secondary | ICD-10-CM

## 2013-09-30 DIAGNOSIS — C50919 Malignant neoplasm of unspecified site of unspecified female breast: Secondary | ICD-10-CM | POA: Insufficient documentation

## 2013-09-30 DIAGNOSIS — Z9221 Personal history of antineoplastic chemotherapy: Secondary | ICD-10-CM | POA: Insufficient documentation

## 2013-09-30 DIAGNOSIS — N63 Unspecified lump in unspecified breast: Secondary | ICD-10-CM | POA: Insufficient documentation

## 2013-09-30 LAB — GLUCOSE, CAPILLARY: GLUCOSE-CAPILLARY: 84 mg/dL (ref 70–99)

## 2013-09-30 MED ORDER — IOHEXOL 300 MG/ML  SOLN
100.0000 mL | Freq: Once | INTRAMUSCULAR | Status: AC | PRN
  Administered 2013-09-30: 100 mL via INTRAVENOUS

## 2013-09-30 MED ORDER — FLUDEOXYGLUCOSE F - 18 (FDG) INJECTION
9.3000 | Freq: Once | INTRAVENOUS | Status: AC | PRN
  Administered 2013-09-30: 9.3 via INTRAVENOUS

## 2013-09-30 NOTE — Telephone Encounter (Signed)
Not additional note 

## 2013-10-01 ENCOUNTER — Other Ambulatory Visit: Payer: BC Managed Care – PPO

## 2013-10-01 ENCOUNTER — Other Ambulatory Visit: Payer: Self-pay | Admitting: *Deleted

## 2013-10-01 ENCOUNTER — Encounter: Payer: Self-pay | Admitting: *Deleted

## 2013-10-01 DIAGNOSIS — C50519 Malignant neoplasm of lower-outer quadrant of unspecified female breast: Secondary | ICD-10-CM

## 2013-10-01 MED ORDER — LORAZEPAM 0.5 MG PO TABS: 0.5000 mg | ORAL_TABLET | Freq: Four times a day (QID) | ORAL | Status: DC | PRN

## 2013-10-01 NOTE — Telephone Encounter (Signed)
This RN phoned in a prescription for Ativan. Hard copy destroyed. This replaced original prescription.

## 2013-10-02 ENCOUNTER — Ambulatory Visit (HOSPITAL_BASED_OUTPATIENT_CLINIC_OR_DEPARTMENT_OTHER): Payer: BC Managed Care – PPO | Admitting: Oncology

## 2013-10-02 ENCOUNTER — Ambulatory Visit: Payer: BC Managed Care – PPO | Admitting: Nutrition

## 2013-10-02 ENCOUNTER — Other Ambulatory Visit (HOSPITAL_BASED_OUTPATIENT_CLINIC_OR_DEPARTMENT_OTHER): Payer: BC Managed Care – PPO

## 2013-10-02 ENCOUNTER — Ambulatory Visit (HOSPITAL_BASED_OUTPATIENT_CLINIC_OR_DEPARTMENT_OTHER): Payer: BC Managed Care – PPO

## 2013-10-02 ENCOUNTER — Encounter: Payer: Self-pay | Admitting: Oncology

## 2013-10-02 VITALS — BP 143/86 | HR 99 | Temp 99.2°F | Resp 20

## 2013-10-02 VITALS — BP 146/62 | HR 114 | Temp 99.0°F | Resp 20 | Ht 64.0 in | Wt 172.4 lb

## 2013-10-02 DIAGNOSIS — Z5112 Encounter for antineoplastic immunotherapy: Secondary | ICD-10-CM

## 2013-10-02 DIAGNOSIS — C50519 Malignant neoplasm of lower-outer quadrant of unspecified female breast: Secondary | ICD-10-CM

## 2013-10-02 DIAGNOSIS — Z170 Estrogen receptor positive status [ER+]: Secondary | ICD-10-CM

## 2013-10-02 DIAGNOSIS — Z5111 Encounter for antineoplastic chemotherapy: Secondary | ICD-10-CM

## 2013-10-02 LAB — COMPREHENSIVE METABOLIC PANEL (CC13)
ALBUMIN: 4 g/dL (ref 3.5–5.0)
ALT: 24 U/L (ref 0–55)
AST: 23 U/L (ref 5–34)
Alkaline Phosphatase: 48 U/L (ref 40–150)
Anion Gap: 10 mEq/L (ref 3–11)
BILIRUBIN TOTAL: 0.76 mg/dL (ref 0.20–1.20)
BUN: 14.3 mg/dL (ref 7.0–26.0)
CO2: 27 mEq/L (ref 22–29)
Calcium: 10.1 mg/dL (ref 8.4–10.4)
Chloride: 102 mEq/L (ref 98–109)
Creatinine: 0.8 mg/dL (ref 0.6–1.1)
GLUCOSE: 170 mg/dL — AB (ref 70–140)
POTASSIUM: 3.4 meq/L — AB (ref 3.5–5.1)
SODIUM: 139 meq/L (ref 136–145)
TOTAL PROTEIN: 8.2 g/dL (ref 6.4–8.3)

## 2013-10-02 LAB — CBC WITH DIFFERENTIAL/PLATELET
BASO%: 0.5 % (ref 0.0–2.0)
Basophils Absolute: 0.1 10*3/uL (ref 0.0–0.1)
EOS%: 0.1 % (ref 0.0–7.0)
Eosinophils Absolute: 0 10*3/uL (ref 0.0–0.5)
HCT: 37.3 % (ref 34.8–46.6)
HGB: 13 g/dL (ref 11.6–15.9)
LYMPH%: 7.2 % — AB (ref 14.0–49.7)
MCH: 32.4 pg (ref 25.1–34.0)
MCHC: 34.8 g/dL (ref 31.5–36.0)
MCV: 93.1 fL (ref 79.5–101.0)
MONO#: 0.3 10*3/uL (ref 0.1–0.9)
MONO%: 2.9 % (ref 0.0–14.0)
NEUT%: 89.3 % — ABNORMAL HIGH (ref 38.4–76.8)
NEUTROS ABS: 9.2 10*3/uL — AB (ref 1.5–6.5)
PLATELETS: 274 10*3/uL (ref 145–400)
RBC: 4.01 10*6/uL (ref 3.70–5.45)
RDW: 12.4 % (ref 11.2–14.5)
WBC: 10.2 10*3/uL (ref 3.9–10.3)
lymph#: 0.7 10*3/uL — ABNORMAL LOW (ref 0.9–3.3)

## 2013-10-02 MED ORDER — SODIUM CHLORIDE 0.9 % IV SOLN
Freq: Once | INTRAVENOUS | Status: AC
  Administered 2013-10-02: 10:00:00 via INTRAVENOUS

## 2013-10-02 MED ORDER — SODIUM CHLORIDE 0.9 % IJ SOLN
10.0000 mL | INTRAMUSCULAR | Status: DC | PRN
  Administered 2013-10-02: 10 mL
  Filled 2013-10-02: qty 10

## 2013-10-02 MED ORDER — SODIUM CHLORIDE 0.9 % IV SOLN
840.0000 mg | Freq: Once | INTRAVENOUS | Status: AC
  Administered 2013-10-02: 840 mg via INTRAVENOUS
  Filled 2013-10-02: qty 28

## 2013-10-02 MED ORDER — OXYCODONE HCL 5 MG PO TABS: 5.0000 mg | ORAL_TABLET | ORAL | Status: DC | PRN

## 2013-10-02 MED ORDER — LORAZEPAM 2 MG/ML IJ SOLN
INTRAMUSCULAR | Status: AC
  Filled 2013-10-02: qty 1

## 2013-10-02 MED ORDER — DIPHENHYDRAMINE HCL 25 MG PO CAPS
ORAL_CAPSULE | ORAL | Status: AC
  Filled 2013-10-02: qty 2

## 2013-10-02 MED ORDER — SODIUM CHLORIDE 0.9 % IV SOLN
75.0000 mg/m2 | Freq: Once | INTRAVENOUS | Status: AC
  Administered 2013-10-02: 140 mg via INTRAVENOUS
  Filled 2013-10-02: qty 14

## 2013-10-02 MED ORDER — ACETAMINOPHEN 325 MG PO TABS
650.0000 mg | ORAL_TABLET | Freq: Once | ORAL | Status: AC
  Administered 2013-10-02: 650 mg via ORAL

## 2013-10-02 MED ORDER — HEPARIN SOD (PORK) LOCK FLUSH 100 UNIT/ML IV SOLN
500.0000 [IU] | Freq: Once | INTRAVENOUS | Status: AC | PRN
  Administered 2013-10-02: 500 [IU]
  Filled 2013-10-02: qty 5

## 2013-10-02 MED ORDER — ONDANSETRON 16 MG/50ML IVPB (CHCC)
16.0000 mg | Freq: Once | INTRAVENOUS | Status: AC
  Administered 2013-10-02: 16 mg via INTRAVENOUS

## 2013-10-02 MED ORDER — DEXAMETHASONE SODIUM PHOSPHATE 20 MG/5ML IJ SOLN
20.0000 mg | Freq: Once | INTRAMUSCULAR | Status: AC
  Administered 2013-10-02: 20 mg via INTRAVENOUS

## 2013-10-02 MED ORDER — ACETAMINOPHEN 325 MG PO TABS
ORAL_TABLET | ORAL | Status: AC
  Filled 2013-10-02: qty 2

## 2013-10-02 MED ORDER — ONDANSETRON 16 MG/50ML IVPB (CHCC)
INTRAVENOUS | Status: AC
  Filled 2013-10-02: qty 16

## 2013-10-02 MED ORDER — LORAZEPAM 2 MG/ML IJ SOLN
0.5000 mg | Freq: Once | INTRAMUSCULAR | Status: AC
  Administered 2013-10-02: 0.5 mg via INTRAVENOUS

## 2013-10-02 MED ORDER — TRASTUZUMAB CHEMO INJECTION 440 MG
8.0000 mg/kg | Freq: Once | INTRAVENOUS | Status: AC
  Administered 2013-10-02: 630 mg via INTRAVENOUS
  Filled 2013-10-02: qty 30

## 2013-10-02 MED ORDER — SODIUM CHLORIDE 0.9 % IV SOLN
703.8000 mg | Freq: Once | INTRAVENOUS | Status: AC
  Administered 2013-10-02: 700 mg via INTRAVENOUS
  Filled 2013-10-02: qty 70

## 2013-10-02 MED ORDER — DIPHENHYDRAMINE HCL 25 MG PO CAPS
50.0000 mg | ORAL_CAPSULE | Freq: Once | ORAL | Status: AC
  Administered 2013-10-02: 50 mg via ORAL

## 2013-10-02 MED ORDER — DEXAMETHASONE SODIUM PHOSPHATE 20 MG/5ML IJ SOLN
INTRAMUSCULAR | Status: AC
  Filled 2013-10-02: qty 5

## 2013-10-02 NOTE — Patient Instructions (Signed)
[facility] Discharge Instructions for Patients Receiving Chemotherapy  Today you received the following chemotherapy agents : Herceptin, Perjeta, Taxotere, Carboplatin  To help prevent nausea and vomiting after your treatment, we encourage you to take your nausea medications as directed:  Decadron 8 mg twice daily X 3 days (start tomorrow)  Zofran 8 mg twice daily X 3 days (start tomorrow) and then as needed  Compazine 10 mg every 6 hours as needed (can make you drowsy)  Ativan 0.5 mg by mouth or sublingual every 6 hours as needed   If you develop nausea and vomiting that is not controlled by your nausea medication, call the clinic.   Remember to push fluids by mouth for the next 48 hours  BELOW ARE SYMPTOMS THAT SHOULD BE REPORTED IMMEDIATELY:  *FEVER GREATER THAN 100.5 F  *CHILLS WITH OR WITHOUT FEVER  NAUSEA AND VOMITING THAT IS NOT CONTROLLED WITH YOUR NAUSEA MEDICATION  *UNUSUAL SHORTNESS OF BREATH  *UNUSUAL BRUISING OR BLEEDING  TENDERNESS IN MOUTH AND THROAT WITH OR WITHOUT PRESENCE OF ULCERS  *URINARY PROBLEMS  *BOWEL PROBLEMS  UNUSUAL RASH Items with * indicate a potential emergency and should be followed up as soon as possible.  Feel free to call the clinic should  you have any questions or concerns. The clinic phone number is [phone].  It has been a pleasure to serve you today!

## 2013-10-02 NOTE — Progress Notes (Signed)
This patient is a 53 year old female diagnosed with breast cancer.  She is a patient of Dr. Chancy Milroy.    Past medical history includes hypertension, degenerative joint disease, and leukocytopenia.  Medications include vitamin C, biotin, calcium with vitamin D, D3, co-Q10, Decadron, L. arginine, Ativan, Zofran, pre-biotics, Compazine, and vitamin E.  Labs include potassium 3.3 on February 18.  Height: 64 inches. Weight: 172.5 pounds. Usual body weight: 189 pounds in October of 2012. BMI: 29.59.  Patient is requesting discussion on healthy diet.  Patient has modified her diet over the past few years with resulting weight loss.  Today is her first chemotherapy session. No side effects have presented yet.  Nutrition diagnosis: Food and nutrition related knowledge deficit related to new diagnosis of breast cancer and associated treatments as evidenced by no prior need for nutrition related information.  Intervention: Patient was educated on a low-fat, plant-based diet with adequate calories and protein to maintain weight and lean body mass.  Encouraged increase in plant-based foods, such as vegetables, whole fruits, whole grains and lean protein sources.  Recommended patient consume protein in small amounts throughout the day.  Discouraged patient from weight gain, but also from, drastic weight loss, during treatment.  Recommended patient attend breast cancer nutrition class and cooking class for further information.  Questions were answered.  Teach back method.  Monitoring, evaluation, goals: Patient will tolerate adequate calories and protein eating a plant-based diet for maintenance of lean body weight.  Next visit: Patient will contact me if she has questions or concerns.

## 2013-10-03 ENCOUNTER — Ambulatory Visit (HOSPITAL_BASED_OUTPATIENT_CLINIC_OR_DEPARTMENT_OTHER): Payer: BC Managed Care – PPO

## 2013-10-03 VITALS — BP 133/82 | HR 89 | Temp 98.3°F | Resp 18

## 2013-10-03 DIAGNOSIS — C50519 Malignant neoplasm of lower-outer quadrant of unspecified female breast: Secondary | ICD-10-CM

## 2013-10-03 DIAGNOSIS — C50419 Malignant neoplasm of upper-outer quadrant of unspecified female breast: Secondary | ICD-10-CM

## 2013-10-03 DIAGNOSIS — Z5189 Encounter for other specified aftercare: Secondary | ICD-10-CM

## 2013-10-03 MED ORDER — PEGFILGRASTIM INJECTION 6 MG/0.6ML
6.0000 mg | Freq: Once | SUBCUTANEOUS | Status: AC
  Administered 2013-10-03: 6 mg via SUBCUTANEOUS

## 2013-10-05 ENCOUNTER — Telehealth: Payer: Self-pay | Admitting: *Deleted

## 2013-10-05 DIAGNOSIS — C50519 Malignant neoplasm of lower-outer quadrant of unspecified female breast: Secondary | ICD-10-CM

## 2013-10-05 MED ORDER — POTASSIUM CHLORIDE CRYS ER 20 MEQ PO TBCR: 20.0000 meq | EXTENDED_RELEASE_TABLET | Freq: Once | ORAL | Status: DC

## 2013-10-05 NOTE — Telephone Encounter (Signed)
As noted below by Dr. Humphrey Rolls, I informed patient that Pet scan looks good. Also, I e - scribed a prescription for potassium to her pharmacy. Patient verbalized understanding.

## 2013-10-05 NOTE — Telephone Encounter (Signed)
Message copied by Hebert Soho on Mon Oct 05, 2013 12:27 PM ------      Message from: [name]      Created: Fri Oct 02, 2013  2:32 PM       Potassium low: take K-dur 20 meq po daily x 3 days. Then stop            #10/2 ------

## 2013-10-05 NOTE — Telephone Encounter (Signed)
Left message to call.

## 2013-10-05 NOTE — Telephone Encounter (Signed)
Left VM for patient to call back with update on how she is feeling.

## 2013-10-06 ENCOUNTER — Encounter: Payer: Self-pay | Admitting: Oncology

## 2013-10-06 NOTE — Progress Notes (Signed)
OFFICE PROGRESS NOTE  CC  Tina [name]., DO [address] [place] [address] Dr. Rolm Bookbinder  Dr. Dr. Eppie Gibson  DIAGNOSIS: 53 year old female with new diagnosis of HER-2 positive right breast cancer  Malignant neoplasm of lower-outer quadrant of female breast   Primary site: Breast (Right)   Staging method: AJCC 7th Edition   Clinical: Stage IIA (T2, NX, cM0) signed by Tina Robinson, MD on 09/20/2013  9:50 PM   Summary: Stage IIA (T2, NX, cM0)  PRIOR THERAPY:  1. Underwent screening mammogram. On the right side she was noted to have a mass with microcalcifications. Ultrasound in February 2015 showed a 1.2 x 1.1 x 1.0 cm rounded hypoechoic mass with irregular margins in the 8:00 position 4 cm from the nipple. No abnormal axillary lymph nodes were noted. She had MRI of the breasts performed that showed in the lower outer right breast 2.2 x 1.3 x 1.5 cm Irregular mass. In the upper outer left breast there was a 7 x 5 x 8 cm indeterminate enhancing mass. Also another 9 x 7 x 6 mm indeterminant enhancing mass at 9:00 in the left breast was noted.there are also noted to be ominent cortices of right axillary lymph nodes. Patient has undergone biopsy of these. On the right patient had biopsy performed of the 1.2 cm mass. The pathology revealed grade 3 invasive ductal carcinoma with ductal carcinoma in situ. Tumor was ER +70% PR +6% HER-2/neu was amplified and proliferation marker Ki-67 70%  2. Referred to genetic counseling:   2. Neoadjuvant TCH/Perjeta every 3 weeks x 6 cycles beginning 10/02/13 followed by breast surgery   CURRENT THERAPY: cycle 1 day 1 of TCH/P  INTERVAL HISTORY: [name] 53 y.o. female returns for follow up visit. She has had her port placed. She overall is feeling. She had echocardiogram performed. Today she denies any headaches double vision blurring of vision fevers chills night sweats. No shortness of breath chest pains palpitations. No  abdominal pain no diarrhea or constipation. She has no easy bruising or bleeding. She has no myalgias and arthralgias. No peripheral paresthesias or gait disturbances. Remainder of the 10 point review of systems is negative.   MEDICAL HISTORY: Past Medical History  Diagnosis Date  . Leukocytopenia   . Hypertension   . Degenerative joint disease   . Leukopenia 06/16/2013  . Needs flu shot 06/17/2013  . Cancer of right breast 09/03/13    Invasive Ductal Carcinoma/Ductal Carcinoma Insitu    ALLERGIES:  is allergic to adhesive and nickel.  MEDICATIONS:  Current Outpatient Prescriptions  Medication Sig Dispense Refill  . ascorbic acid (VITAMIN C) 1000 MG tablet Take 1,000 mg by mouth daily.      Marland Kitchen aspirin 81 MG tablet Take 81 mg by mouth daily.      Marland Kitchen BIOTIN 5000 PO Take by mouth daily.       . Calcium Carbonate-Vit D-Min (CALTRATE 600+D PLUS MINERALS PO) Take 1 tablet by mouth daily.      . Cholecalciferol (VITAMIN D3) 10000 UNITS capsule Take 10,000 Units by mouth daily.      . Coenzyme Q10 (CO Q 10 PO) Take by mouth daily.      Marland Kitchen dexamethasone (DECADRON) 4 MG tablet Take 2 tablets (8 mg total) by mouth 2 (two) times daily. Start the day before Taxotere. Then again the day after chemo for 3 days.  30 tablet  1  . L-ARGININE PO Take by mouth daily.      Marland Kitchen  lidocaine-prilocaine (EMLA) cream Apply topically as needed.  30 g  6  . lisinopril-hydrochlorothiazide (PRINZIDE,ZESTORETIC) 20-12.5 MG per tablet Take 1 tablet by mouth 2 (two) times daily.      Marland Kitchen LORazepam (ATIVAN) 0.5 MG tablet Take 1 tablet (0.5 mg total) by mouth every 6 (six) hours as needed (Nausea or vomiting).  30 tablet  0  . ondansetron (ZOFRAN) 8 MG tablet Take 1 tablet (8 mg total) by mouth 2 (two) times daily. Start the day after chemo for 3 days. Then take as needed for nausea or vomiting.  30 tablet  1  . OVER THE COUNTER MEDICATION Take 1 tablet by mouth daily. Trace Minerals      . Probiotic Product (TRUBIOTICS) CAPS  Take 1 capsule by mouth daily.      . prochlorperazine (COMPAZINE) 10 MG tablet Take 1 tablet (10 mg total) by mouth every 6 (six) hours as needed (Nausea or vomiting).  30 tablet  1  . VITAMIN E PO Take 1 capsule by mouth daily.       Marland Kitchen oxyCODONE (OXY IR/ROXICODONE) 5 MG immediate release tablet Take 1 tablet (5 mg total) by mouth every 4 (four) hours as needed for severe pain.  60 tablet  0  . potassium chloride SA (K-DUR,KLOR-CON) 20 MEQ tablet Take 1 tablet (20 mEq total) by mouth once. X 3 days, then stop  5 tablet  0   No current facility-administered medications for this visit.    SURGICAL HISTORY:  Past Surgical History  Procedure Laterality Date  . Cesarean section    . Carpal tunnel release    . Trigger finger release      REVIEW OF SYSTEMS:  Pertinent items are noted in HPI.     PHYSICAL EXAMINATION: Blood pressure 146/62, pulse 114, temperature 99 F (37.2 C), temperature source Oral, resp. rate 20, height '5\' 4"'  (1.626 m), weight 172 lb 6.4 oz (78.2 kg), last menstrual period 06/25/2013. Body mass index is 29.58 kg/(m^2). ECOG PERFORMANCE STATUS: 0 - Asymptomatic  Well-developed nourished female in no acute distress HEENT exam: EOMI PERRLA sclerae anicteric no conjunctival pallor oral mucosa is moist neck supple no palpable cervical supraclavicular or axillary adenopathy Lungs: Clear to auscultation and percussion Cardiovascular: Regular rate rhythm no murmurs gallops or rubs Abdomen: Soft nontender nondistended bowel sounds are present no hepatosplenomegaly or palpable masses Extremities: No edema clubbing or cyanosis, pedal pulses are present Neuro: Alert oriented x3 DTRs +4 strength is symmetrical in upper and lower extremities, gait normal, but is grossly normal Skin: Warm and moist, good capillary filling, no rashes. Breasts: breasts appear normal, no suspicious masses, no skin or nipple changes or axillary nodes.     LABORATORY DATA: Lab Results  Component  Value Date   WBC 10.2 10/02/2013   HGB 13.0 10/02/2013   HCT 37.3 10/02/2013   MCV 93.1 10/02/2013   PLT 274 10/02/2013      Chemistry      Component Value Date/Time   NA 139 10/02/2013 0917   NA 138 09/25/2013 1320   K 3.4* 10/02/2013 0917   K 3.6* 09/25/2013 1320   CL 98 09/25/2013 1320   CL 104 06/16/2012 1303   CO2 27 10/02/2013 0917   CO2 27 09/25/2013 1320   BUN 14.3 10/02/2013 0917   BUN 9 09/25/2013 1320   CREATININE 0.8 10/02/2013 0917   CREATININE 0.82 09/25/2013 1320      Component Value Date/Time   CALCIUM 10.1 10/02/2013 0917   CALCIUM  10.0 09/25/2013 1320   ALKPHOS 48 10/02/2013 0917   ALKPHOS 42 10/04/2012 1830   AST 23 10/02/2013 0917   AST 32 10/04/2012 1830   ALT 24 10/02/2013 0917   ALT 27 10/04/2012 1830   BILITOT 0.76 10/02/2013 0917   BILITOT 0.7 10/04/2012 1830       RADIOGRAPHIC STUDIES:  Dg Eye Foreign Body  09/11/2013   CLINICAL 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  FINDINGS: Water's views with eyes deviated toward the left and toward the right were obtained. No radiopaque foreign body in the orbits identified. Paranasal sinuses are clear except for a small right frontal osteoma. No fracture or dislocation.  IMPRESSION: No evidence of metallic foreign body within the orbits. Small right frontal sinus osteoma, a benign finding.   Electronically Signed   By: Lowella Grip M.D.   On: 09/11/2013 09:23   Dg Chest 2 View  09/28/2013   CLINICAL DATA:  Chest pain since Port-A-Cath placement 3-4 days ago  EXAM: CHEST  2 VIEW  COMPARISON:  IR FLUORO GUIDE CV LINE*R* dated 09/25/2013; DG CHEST 2 VIEW dated 10/11/2010  FINDINGS: Heart size and vascular pattern are normal. Lungs are clear. No consolidation effusion or pneumothorax. Port-A-Cath is identified with tip over the cavoatrial junction.  IMPRESSION: No acute abnormalities   Electronically Signed   By: Skipper Cliche M.D.   On: 09/28/2013 14:19   Ct Chest W  Contrast  09/30/2013   CLINICAL DATA:  Breast cancer diagnosed 2015. Chemotherapy to start. Chest pain  EXAM: CT CHEST, ABDOMEN, AND PELVIS WITH CONTRAST  TECHNIQUE: Multidetector CT imaging of the chest, abdomen and pelvis was performed following the standard protocol during bolus administration of intravenous contrast.  CONTRAST:  134m OMNIPAQUE IOHEXOL 300 MG/ML  SOLN  COMPARISON:  NM PET IMAGE INITIAL (PI) SKULL BASE TO THIGH dated 09/30/2013  FINDINGS: CT CHEST FINDINGS  There is a 1.5 cm mass in hte the posterior right breast. Rounded right axial lymph nodes are not pathologic by size criteria however to have a rounded morphology. Lymph nodes extend into the right subpectoral is location with a 6 mm node (image 14). 11 mm node in the left axilla (image 17).  There is no supraclavicular or infraclavicular adenopathy. No clear infraclavicular supraclavicular adenopathy. There is enlargement of a left internal mammary vein on the right without clear internal mammary adenopathy.  There several borderline enlarged left axillary lymph nodes. For example 10 mm node image 14, series 2.  There is a soft tissue within the anterior mediastinum likely representing residual thymus. No mediastinal lymphadenopathy. No pericardial fluid.  Review of the lung parenchyma demonstrates no suspicious findings No nodularity.  CT ABDOMEN AND PELVIS FINDINGS  Low -density lesion the posterior right hepatic lobe measuring 2.7 cm. This has peripheral enhancement typical of hemangioma. Gallbladder, pancreas, spleen, adrenal glands, and kidneys are normal. There are bilateral nonenhancing renal cyst.  Stomach, small bowel, appendix, cecum normal. The colon and rectosigmoid colon are normal.  Abdominal aorta is normal caliber. No retroperitoneal or periportal lymphadenopathy.  No free fluid the pelvis. The uterus is normal. There is a fatty lesion in the left adnexal with a small calcification consistent with a dermoid lesion and measures  2.7 x 2.5 cm. Left ovary is normal. No pelvic lymphadenopathy. No aggressive osseous lesion.  IMPRESSION: 1. Small right breast mass consistent prior breast carcinoma. 2. Borderline enlarged axillary lymph  nodes are mildly hypermetabolic on comparison PET-CT scan. 3. No internal mammary metastasis or mediastinal metastasis. 4. Hemangioma within the liver. 5. Mature teratoma associated with the right ovary.   Electronically Signed   By: Suzy Bouchard M.D.   On: 09/30/2013 16:28   Mr Breast Bilateral W Wo Contrast  09/14/2013   ADDENDUM REPORT: 09/14/2013 08:23  ADDENDUM: The mass in the upper outer left breast, middle depth, measures 7 x 5 x 8 mm, not 7 x 5 x 8 cm.   Electronically Signed   By: Enrique Sack M.D.   On: 09/14/2013 08:23   09/14/2013   CLINICAL DATA:  New diagnosis of invasive ductal carcinoma and ductal carcinoma in situ in the right breast at 8 o'clock.  EXAM: BILATERAL BREAST MRI WITH AND WITHOUT CONTRAST  LABS:  BUN and creatinine were obtained on site at [facility] at  [address]  Results:  BUN 9 mg/dL,  Creatinine 0.7 mg/dL.  TECHNIQUE: Multiplanar, multisequence MR images of both breasts were obtained prior to and following the intravenous administration of 36m of MultiHance.  THREE-DIMENSIONAL MR IMAGE RENDERING ON INDEPENDENT WORKSTATION:  Three-dimensional MR images were rendered by post-processing of the original MR data on an independent workstation. The three-dimensional MR images were interpreted, and findings are reported in the following complete MRI report for this study. Three dimensional images were evaluated at the independent DynaCad workstation  COMPARISON:  Previous exams  FINDINGS: Breast composition: c:  Heterogeneous fibroglandular tissue  Background parenchymal enhancement: Moderate  Right breast: In the lower outer right breast, there is an irregular enhancing mass measuring 2.2 x 1.3 by 1.5 cm. This is consistent with the recently biopsied invasive  ductal carcinoma. Within this mass is susceptibility artifact, consistent with biopsy clip. No additional areas of abnormal enhancement are identified  Left breast: In the upper-outer left breast, middle depth, there is an oval circumscribed mass measuring 7 x 5 x 8 cm. This mass has mixed kinetics, which includes washout kinetics. At 9 o'clock in the left breast, there is an oval enhancing mass measuring 9 x 7 x 6 mm. There is associated plateau kinetics.  Lymph nodes: Right axillary lymph nodes with prominent cortices are identified, with the largest node measuring up to 1 cm in short axis.  Ancillary findings:  None.  IMPRESSION: 1. A 2.2 x 1.3 x 1.5 cm irregular mass in lower outer right breast consistent with the recently biopsied invasive ductal carcinoma. 2. A 7 x 5 x 8 cm indeterminate enhancing mass in the upper-outer left breast. 3. A 9 x 7 x 6 mm indeterminate enhancing mass at 9 o'clock in the left breast. 4. Prominent cortices of right axillary lymph nodes.  RECOMMENDATION: Ultrasound is recommended for evaluation of the masses in the left breast. If seen sonographically, ultrasound-guided biopsies are recommended. If these masses are not seen on ultrasound, MRI guided biopsies are recommended. Second-look ultrasound of the right axilla is also recommended given the MRI finding of right axillary nodes with prominent cortices.  BI-RADS CATEGORY  4: Suspicious abnormality - biopsy should be considered.  Electronically Signed: By: MDonavan BurnetM.D. On: 09/11/2013 14:26   Nm Pet Image Initial (pi) Skull Base To Thigh  09/30/2013   CLINICAL DATA:  Initial treatment strategy for breast carcinoma.  EXAM: NUCLEAR MEDICINE PET SKULL BASE TO THIGH  FASTING BLOOD GLUCOSE:  Value: 84 mg/dl  TECHNIQUE: 9.3 mCi F-18 FDG was injected intravenously. Full-ring PET imaging was performed from the  skull base to thigh after the radiotracer. CT data was obtained and used for attenuation correction and anatomic  localization.  COMPARISON:  CT 09/30/2013  FINDINGS: NECK  No hypermetabolic lymph nodes in the neck.  CHEST  There is a hypermetabolic mass in the right breast with SUV max 9.4. There are small right axillary lymph nodes which are not pathologic enlarged but do have mild metabolic activity with SUV max less than 2. There is similar small lymph nodes within the left axilla.  No hypermetabolic internal mammary nodes. No hypermetabolic mediastinal nodes.  ABDOMEN/PELVIS  No abnormal hypermetabolic activity within the liver, pancreas, adrenal glands, or spleen. No hypermetabolic lymph nodes in the abdomen or pelvis. No abnormal metabolic activity associated with the right adnexal dermoid lesion.  SKELETON  No focal hypermetabolic activity to suggest skeletal metastasis.  IMPRESSION: 1. Hypermetabolic right breast mass. 2. Bilateral minimally hypermetabolic axillary lymph nodes are indeterminate. If right axillary lymph nodes are positive on resection, consider biopsy of the left axillary nodes. 3. No evidence of mediastinal nodal metastasis. 4. No  distant metastasis.   Electronically Signed   By: Suzy Bouchard M.D.   On: 09/30/2013 16:29   Ir Fluoro Guide Cv Line Right  09/25/2013   INDICATION: History of right-sided breast cancer, please place Port a catheter for chemotherapy administration  EXAM: IMPLANTED PORT A CATH PLACEMENT WITH ULTRASOUND AND FLUOROSCOPIC GUIDANCE  MEDICATIONS: Ancef 2 gm IV; IV antibiotic was given in an appropriate time interval prior to skin puncture.  ANESTHESIA/SEDATION: Versed 4 mg IV; Fentanyl 100 mcg IV;  Total Moderate Sedation Time  30  minutes.  CONTRAST:  None  COMPARISON:  None.  FLUOROSCOPY TIME:  36 seconds.  PROCEDURE: The procedure, risks, benefits, and alternatives were explained to the patient. Questions regarding the procedure were encouraged and answered. The patient understands and consents to the procedure.  Given history of right-sided breast cancer, the decision  was made to place a left anterior chest wall Port a Catheter. As such, the left neck and chest were prepped with chlorhexidine in a sterile fashion, and a sterile drape was applied covering the operative field. Maximum barrier sterile technique with sterile gowns and gloves were used for the procedure. A timeout was performed prior to the initiation of the procedure. Local anesthesia was provided with 1% lidocaine with epinephrine.  After creating a small venotomy incision, a micropuncture kit was utilized to access the internal jugular vein under direct, real-time ultrasound guidance. Ultrasound image documentation was performed. The microwire was kinked to measure appropriate catheter length.  A subcutaneous port pocket was then created along the upper chest wall utilizing a combination of sharp and blunt dissection. The pocket was irrigated with sterile saline. A single lumen ISP power injectable port was chosen for placement. The 8 Fr catheter was tunneled from the port pocket site to the venotomy incision. The port was placed in the pocket. The external catheter was trimmed to appropriate length. At the venotomy, an 8 Fr peel-away sheath was placed over a guidewire under fluoroscopic guidance. The catheter was then placed through the sheath and the sheath was removed. Final catheter positioning was confirmed and documented with a fluoroscopic spot radiograph. The port was accessed with a Huber needle, aspirated and flushed with heparinized saline.  The venotomy site was closed with an interrupted 4-0 Vicryl suture. The port pocket incision was closed with interrupted 2-0 Vicryl suture and the skin was opposed with a running subcuticular 4-0 Vicryl suture. Dermabond and  Steri-strips were applied to both incisions. Dressings were placed. The patient tolerated the procedure well without immediate post procedural complication.  COMPLICATIONS: None immediate  FINDINGS: After catheter placement, the tip lies within  the superior cavoatrial junction. The catheter aspirates and flushes normally and is ready for immediate use.  IMPRESSION: Successful placement of a left internal jugular approach power injectable Port-A-Cath. The catheter is ready for immediate use.   Electronically Signed   By: Sandi Mariscal M.D.   On: 09/25/2013 17:14   Ir US Guide Vasc Access Left  09/25/2013   INDICATION: History of right-sided breast cancer, please place Port a catheter for chemotherapy administration  EXAM: IMPLANTED PORT A CATH PLACEMENT WITH ULTRASOUND AND FLUOROSCOPIC GUIDANCE  MEDICATIONS: Ancef 2 gm IV; IV antibiotic was given in an appropriate time interval prior to skin puncture.  ANESTHESIA/SEDATION: Versed 4 mg IV; Fentanyl 100 mcg IV;  Total Moderate Sedation Time  30  minutes.  CONTRAST:  None  COMPARISON:  None.  FLUOROSCOPY TIME:  36 seconds.  PROCEDURE: The procedure, risks, benefits, and alternatives were explained to the patient. Questions regarding the procedure were encouraged and answered. The patient understands and consents to the procedure.  Given history of right-sided breast cancer, the decision was made to place a left anterior chest wall Port a Catheter. As such, the left neck and chest were prepped with chlorhexidine in a sterile fashion, and a sterile drape was applied covering the operative field. Maximum barrier sterile technique with sterile gowns and gloves were used for the procedure. A timeout was performed prior to the initiation of the procedure. Local anesthesia was provided with 1% lidocaine with epinephrine.  After creating a small venotomy incision, a micropuncture kit was utilized to access the internal jugular vein under direct, real-time ultrasound guidance. Ultrasound image documentation was performed. The microwire was kinked to measure appropriate catheter length.  A subcutaneous port pocket was then created along the upper chest wall utilizing a combination of sharp and blunt dissection. The  pocket was irrigated with sterile saline. A single lumen ISP power injectable port was chosen for placement. The 8 Fr catheter was tunneled from the port pocket site to the venotomy incision. The port was placed in the pocket. The external catheter was trimmed to appropriate length. At the venotomy, an 8 Fr peel-away sheath was placed over a guidewire under fluoroscopic guidance. The catheter was then placed through the sheath and the sheath was removed. Final catheter positioning was confirmed and documented with a fluoroscopic spot radiograph. The port was accessed with a Huber needle, aspirated and flushed with heparinized saline.  The venotomy site was closed with an interrupted 4-0 Vicryl suture. The port pocket incision was closed with interrupted 2-0 Vicryl suture and the skin was opposed with a running subcuticular 4-0 Vicryl suture. Dermabond and Steri-strips were applied to both incisions. Dressings were placed. The patient tolerated the procedure well without immediate post procedural complication.  COMPLICATIONS: None immediate  FINDINGS: After catheter placement, the tip lies within the superior cavoatrial junction. The catheter aspirates and flushes normally and is ready for immediate use.  IMPRESSION: Successful placement of a left internal jugular approach power injectable Port-A-Cath. The catheter is ready for immediate use.   Electronically Signed   By: Sandi Mariscal M.D.   On: 09/25/2013 17:14   Mm Digital Diagnostic Unilat L  09/16/2013   CLINICAL DATA:  Patient status post ultrasound-guided core needle biopsy left breast mass  EXAM: DIAGNOSTIC LEFT MAMMOGRAM POST ULTRASOUND  BIOPSY  COMPARISON:  Previous exams  FINDINGS: Mammographic images were obtained following ultrasound guided biopsy of left breast mass. Wing shaped biopsy marking clip within the upper-outer quadrant of the left breast is in appropriate position.  IMPRESSION: Appropriate position biopsy marking clip left breast status post  ultrasound-guided core needle biopsy.  Final Assessment: Post Procedure Mammograms for Marker Placement   Electronically Signed   By: Lovey Newcomer M.D.   On: 09/16/2013 15:12   US Breast Ltd Uni Left Inc Axilla  09/16/2013   CLINICAL DATA:  Patient with recent diagnosis right breast carcinoma. Suspicious enhancing mass within the upper-outer quadrant left breast on prior MRI. Additional questioned mass within the 9 o'clock position left breast on prior MRI.  EXAM: ULTRASOUND OF THE LEFT BREAST  COMPARISON:  Breast MRI 09/14/2013; mammograms dating back to 10/11/2008.  FINDINGS: On physical exam,I palpate no discrete mass within the upper-outer quadrant of the left breast or within the 9 o'clock position of the left breast.  Ultrasound is performed, showing a 0.6 x 0.5 x 0.9 cm oval circumscribed hypoechoic mass within the left breast 2 o'clock position 4 cm from the nipple corresponding with enhancing mass identified on prior breast MRI.  No concerning mass identified within the left breast 9 o'clock position to correspond with the questioned enhancing tissue in this location on prior MRI.  IMPRESSION: 1. Hypoechoic mass within the upper-outer quadrant left breast corresponds with MRI finding. Ultrasound-guided core needle biopsy will be performed 09/16/2013. 2. No concerning lesion identified within the 9 o'clock position left breast on ultrasound. Upon additional review of the left MRI, in agreement with colleagues, this previously described area is felt to represent normal fibroglandular breast tissue.  RECOMMENDATION: Ultrasound-guided core needle biopsy left breast mass.  I have discussed the findings and recommendations with the patient. Results were also provided in writing at the conclusion of the visit. If applicable, a reminder letter will be sent to the patient regarding the next appointment.  BI-RADS CATEGORY  4: Suspicious abnormality - biopsy should be considered.   Electronically Signed   By: Lovey Newcomer M.D.   On: 09/16/2013 15:10   US Breast Ltd Uni Right Inc Axilla  09/16/2013   CLINICAL DATA:  Patient with recent diagnosis right breast carcinoma. Borderline enlarged right axillary lymph node on breast MRI.  EXAM: ULTRASOUND OF THE RIGHT AXILLA  COMPARISON:  Breast MRI 09/14/2013; mammograms dating back to 10/11/2008.  FINDINGS: On physical exam,I palpate no definite mass within the right axillary region.  Ultrasound is performed, showing a cortically thickened right axillary lymph node corresponding with lymph node identified on breast MRI.  IMPRESSION: Right axillary lymph node with cortical thickening and patient with recent diagnosis of right breast carcinoma.  RECOMMENDATION: Ultrasound-guided core needle biopsy thickened right axillary lymph node. This will be performed 09/16/2013.  I have discussed the findings and recommendations with the patient. Results were also provided in writing at the conclusion of the visit. If applicable, a reminder letter will be sent to the patient regarding the next appointment.  BI-RADS CATEGORY  4: Suspicious abnormality - biopsy should be considered.   Electronically Signed   By: Lovey Newcomer M.D.   On: 09/16/2013 15:04   [place] Lt Breast Bx W Loc Dev 1st Lesion Img Bx Spec US Guide  09/17/2013   ADDENDUM REPORT: 09/17/2013 12:06  ADDENDUM: Pathology revealed a benign lymph node and benign breast tissue from the left breast biopsy. This was found to be concordant  by Dr. Lovey Newcomer. Pathology was relayed to the patient by telephone. The patient reported doing well after the biopsy. Post biopsy instructions were reviewed and her questions were answered. She was encouraged to call [facility] for any additional concerns. She was recently diagnosed with right breast cancer and is told to follow her treatment plan.  Pathology results reported by Susa Raring RN, BSN on September 17, 2013.   Electronically Signed   By: Lovey Newcomer M.D.   On:  09/17/2013 12:06   09/17/2013   CLINICAL DATA:  Patient with recent diagnosis right breast carcinoma. Suspicious mass within the upper-outer quadrant left breast on prior MRI and identified on ultrasound.  EXAM: ULTRASOUND GUIDED LEFT BREAST CORE NEEDLE BIOPSY WITH VACUUM ASSIST  COMPARISON:  Previous exams.  PROCEDURE: I met with the patient and we discussed the procedure of ultrasound-guided biopsy, including benefits and alternatives. We discussed the high likelihood of a successful procedure. We discussed the risks of the procedure including infection, bleeding, tissue injury, clip migration, and inadequate sampling. Informed written consent was given. The usual time-out protocol was performed immediately prior to the procedure.  Using sterile technique and 2% Lidocaine as local anesthetic, under direct ultrasound visualization, a 12 gauge vacuum-assisteddevice was used to perform biopsy of a hypoechoic mass within the left breast using a lateral approach. At the conclusion of the procedure, a wing shaped tissue marker clip was deployed into the biopsy cavity. Follow-up 2-view mammogram was performed and dictated separately.  IMPRESSION: Ultrasound-guided biopsy of left breast mass. No apparent complications.  Electronically Signed: By: Lovey Newcomer M.D. On: 09/16/2013 15:11   [place] Rt Breast Bx W Loc Dev 1st Lesion Img Bx Spec US Guide  09/17/2013   ADDENDUM REPORT: 09/17/2013 12:04  ADDENDUM: Pathology revealed a benign right axillary lymph node. This was found to be concordant by Dr. Lovey Newcomer. Pathology was relayed to the patient by telephone. The patient reported doing well after the biopsy. Post biopsy instructions were reviewed and her questions were answered. She was encouraged to call [facility] for any additional concerns. She was recently diagnosed with right breast cancer and is told to follow her treatment plan.  Pathology results reported by Susa Raring RN, BSN on  September 17, 2013.   Electronically Signed   By: Lovey Newcomer M.D.   On: 09/17/2013 12:04   09/17/2013   CLINICAL DATA:  Patient with recent diagnosis right breast carcinoma. Cortically thickened right axillary lymph node on prior MRI and ultrasound.  EXAM: ULTRASOUND GUIDED CORE NEEDLE BIOPSY OF A RIGHT AXILLARY NODE  COMPARISON:  Previous exams.  FINDINGS: I met with the patient and we discussed the procedure of ultrasound-guided biopsy, including benefits and alternatives. We discussed the high likelihood of a successful procedure. We discussed the risks of the procedure, including infection, bleeding, tissue injury, clip migration, and inadequate sampling. Informed written consent was given. The usual time-out protocol was performed immediately prior to the procedure.  Using sterile technique and 2% Lidocaine as local anesthetic, under direct ultrasound visualization, a 14 gauge spring-loaded device was used to perform biopsy of a cortically thickened right axillary lymph node using a lateral approach.  IMPRESSION: Ultrasound guided biopsy of cortically thickened right axillary lymph node. No apparent complications.  Electronically Signed: By: Lovey Newcomer M.D. On: 09/16/2013 15:05   Ir Radiologist Eval & Mgmt  09/28/2013   EXAM: ESTABLISHED PATIENT OFFICE VISIT -LEVEL II (29518)  HISTORY  OF PRESENT ILLNESS: Mrs. Cadenhead is a 53 year old black female with history of right breast carcinoma and recently placed left internal jugular Port-A-Cath on 09/25/2013. The procedure was performed without immediate complications. Patient called the IR service today to reported feeling of fullness in the left lateral and anterior neck region exacerbated with bending forward. Patient states her symptoms began over this past weekend. She denies acute substernal pain, fevers, nausea, vomiting or diaphoresis. The patient has taken ibuprofen with some relief of symptoms.  CHIEF COMPLAINT: Anterior / left lateral neck fullness   PHYSICAL EXAMINATION: On exam today both the Port-A-Cath and left internal jugular access sites are clean and dry with minimal tenderness, no evidence of erythema/edema/drainage. Chest is clear to auscultation bilaterally, heart with regular rate and rhythm. There is no appreciable neck or upper chest edema. Limited ultrasound of the left internal jugular vein shows no evidence of thrombosis but diameter is small. Images were reviewed by Dr. Aletta Edouard.  ASSESSMENT AND PLAN: Patient with history of right breast carcinoma, status post left internal jugular Port-A-Cath placement on 09/25/2013. Now with fullness in the left lateral and anterior neck regions. At this time the patient's physical exam is unremarkable. There is no evidence of left internal jugular vein thrombosis by limited ultrasound but diameter is small. Recommendations at this time are for conservative care and avoidance of activities that exacerbate the above symptoms. The patient is scheduled for CT chest abdomen and pelvis on 09/30/2013 and follow-up with Dr. Humphrey Rolls on 10/02/2013. Patient was told to contact the Oncology or Interventional Radiology Services in the interim should symptoms worsen, especially with regards to any upper extremity/facial/neck/upper chest edema. Dr. Kathlene Cote was present during the above evaluation.  Read by: Rowe Robert ,P.A.-C.   Electronically Signed   By: Aletta Edouard M.D.   On: 09/28/2013 15:45    ASSESSMENT/PLAN: 53 year old female with  1.StageII(T2 NX) invasive ductal carcinoma with DCIS of the right breast. Biopsy revealed tumor to be ER+PR+ HER-2/neu positive with proliferation marker Ki-67 70%   2. Neoadjuvant Chemotherapy: begin TCH/P today, patient understand risks and benefits, rational for this chemotherapy. We will proceed with her chemo today. She has her anti-emetics. She will return in 1 day for neulasta.  3. Cardiology: patient has had Echocardiogram. She will also be seen by Dr. Haroldine Laws  on 3/11.   4. Follow up: she will sen seen back in 1 week for blood work, office visit and chemotherapy toxicity check   All questions were answered. The patient knows to call the clinic with any problems, questions or concerns. We can certainly see the patient much sooner if necessary.  I spent 20 minutes counseling the patient face to face. The total time spent in the appointment was 30 minutes.    Marcy Panning, MD Medical/Oncology [facility] [phone] (beeper) [phone] (Office)  10/06/2013, 9:21 PM

## 2013-10-06 NOTE — Telephone Encounter (Signed)
Patient returned chemotherapy follow up call.  Reports she is feeling tired, weak and constipated.  Has taken M.O.M. Friday and last night.  Bowels are moving but not emptying well.  Drinking 80 oz or more daily.  Eating with slight nausea but no emesis.  Anti-emetics in use.  Reports bowel changes with dexamethasone before treatment along with acid reflux.  Sleeping better now.  Denies any further side effects or symptoms.  Encouraged bowel regimen due to use of narcotic pain medication.  Senokot-S on a daily basis and continuing her water intake discussed.

## 2013-10-07 ENCOUNTER — Telehealth: Payer: Self-pay

## 2013-10-07 ENCOUNTER — Ambulatory Visit (HOSPITAL_COMMUNITY): Payer: BC Managed Care – PPO

## 2013-10-07 NOTE — Telephone Encounter (Signed)
Pt called c/o weakness, epistaxis, constipation, nausea and vomiting (2x today).  She forgot to take her nausea meds last night - woke up this am sick.  Took compazine at 7 am - no longer nauseous.  Advised her to stay ahead of the nausea with her nausea medications.  Epistaxis - not bad and she was able to get it stop.  Advised to call if she had a nose bleed she could not stop or that was bleeding profusely and to use saline nasal spray & blow her nose gently.  She reports constipation- has had a bowel movement today.  She went out yesterday to get constipation meds - feels she has what she needs.  States she is "weaker than she has ever been in her life".  Confirmed she is drinking 64+ oz water/day, sleeping well and eating.  She can ambulate and manage ADLs.  Denies SOB.  Advised her that fatigue is common side effect of chemo, to keep moving, to budget her energy and manage rest times and activities.  Let her know to call if problems worsen.  Pt voiced understanding.

## 2013-10-08 ENCOUNTER — Telehealth: Payer: Self-pay | Admitting: *Deleted

## 2013-10-08 NOTE — Telephone Encounter (Signed)
Left a message for the pt for the pt to call me back so I can get her seen sooner for genetics.

## 2013-10-09 ENCOUNTER — Other Ambulatory Visit (HOSPITAL_BASED_OUTPATIENT_CLINIC_OR_DEPARTMENT_OTHER): Payer: BC Managed Care – PPO | Admitting: *Deleted

## 2013-10-09 ENCOUNTER — Encounter: Payer: Self-pay | Admitting: Oncology

## 2013-10-09 ENCOUNTER — Telehealth: Payer: Self-pay | Admitting: Oncology

## 2013-10-09 ENCOUNTER — Other Ambulatory Visit: Payer: Self-pay

## 2013-10-09 ENCOUNTER — Other Ambulatory Visit (HOSPITAL_BASED_OUTPATIENT_CLINIC_OR_DEPARTMENT_OTHER): Payer: BC Managed Care – PPO

## 2013-10-09 ENCOUNTER — Ambulatory Visit (HOSPITAL_BASED_OUTPATIENT_CLINIC_OR_DEPARTMENT_OTHER): Payer: BC Managed Care – PPO | Admitting: Oncology

## 2013-10-09 ENCOUNTER — Ambulatory Visit: Payer: BC Managed Care – PPO

## 2013-10-09 VITALS — BP 127/81 | HR 93 | Temp 99.2°F | Resp 18 | Ht 64.0 in | Wt 171.2 lb

## 2013-10-09 DIAGNOSIS — R197 Diarrhea, unspecified: Secondary | ICD-10-CM

## 2013-10-09 DIAGNOSIS — Z170 Estrogen receptor positive status [ER+]: Secondary | ICD-10-CM

## 2013-10-09 DIAGNOSIS — C50519 Malignant neoplasm of lower-outer quadrant of unspecified female breast: Secondary | ICD-10-CM

## 2013-10-09 DIAGNOSIS — R110 Nausea: Secondary | ICD-10-CM

## 2013-10-09 DIAGNOSIS — N390 Urinary tract infection, site not specified: Secondary | ICD-10-CM

## 2013-10-09 DIAGNOSIS — E860 Dehydration: Secondary | ICD-10-CM

## 2013-10-09 DIAGNOSIS — E876 Hypokalemia: Secondary | ICD-10-CM

## 2013-10-09 DIAGNOSIS — K1370 Unspecified lesions of oral mucosa: Secondary | ICD-10-CM

## 2013-10-09 DIAGNOSIS — B370 Candidal stomatitis: Secondary | ICD-10-CM

## 2013-10-09 LAB — COMPREHENSIVE METABOLIC PANEL (CC13)
ALBUMIN: 3.4 g/dL — AB (ref 3.5–5.0)
ALT: 16 U/L (ref 0–55)
AST: 12 U/L (ref 5–34)
Alkaline Phosphatase: 64 U/L (ref 40–150)
Anion Gap: 12 mEq/L — ABNORMAL HIGH (ref 3–11)
BUN: 10.8 mg/dL (ref 7.0–26.0)
CALCIUM: 9.1 mg/dL (ref 8.4–10.4)
CHLORIDE: 97 meq/L — AB (ref 98–109)
CO2: 25 mEq/L (ref 22–29)
Creatinine: 0.9 mg/dL (ref 0.6–1.1)
Glucose: 157 mg/dl — ABNORMAL HIGH (ref 70–140)
POTASSIUM: 3 meq/L — AB (ref 3.5–5.1)
Sodium: 133 mEq/L — ABNORMAL LOW (ref 136–145)
TOTAL PROTEIN: 6.8 g/dL (ref 6.4–8.3)
Total Bilirubin: 0.41 mg/dL (ref 0.20–1.20)

## 2013-10-09 LAB — CBC WITH DIFFERENTIAL/PLATELET
BASO%: 0.2 % (ref 0.0–2.0)
Basophils Absolute: 0 10*3/uL (ref 0.0–0.1)
EOS%: 0.2 % (ref 0.0–7.0)
Eosinophils Absolute: 0 10*3/uL (ref 0.0–0.5)
HCT: 38 % (ref 34.8–46.6)
HEMOGLOBIN: 13 g/dL (ref 11.6–15.9)
LYMPH#: 2.6 10*3/uL (ref 0.9–3.3)
LYMPH%: 17.1 % (ref 14.0–49.7)
MCH: 31.4 pg (ref 25.1–34.0)
MCHC: 34.4 g/dL (ref 31.5–36.0)
MCV: 91.3 fL (ref 79.5–101.0)
MONO#: 0.6 10*3/uL (ref 0.1–0.9)
MONO%: 3.7 % (ref 0.0–14.0)
NEUT%: 78.8 % — ABNORMAL HIGH (ref 38.4–76.8)
NEUTROS ABS: 12 10*3/uL — AB (ref 1.5–6.5)
Platelets: 234 10*3/uL (ref 145–400)
RBC: 4.16 10*6/uL (ref 3.70–5.45)
RDW: 12.4 % (ref 11.2–14.5)
WBC: 15.2 10*3/uL — ABNORMAL HIGH (ref 3.9–10.3)

## 2013-10-09 LAB — URINALYSIS, MICROSCOPIC - CHCC
Bilirubin (Urine): NEGATIVE
Glucose: NEGATIVE mg/dL
KETONES: NEGATIVE mg/dL
Leukocyte Esterase: NEGATIVE
Nitrite: NEGATIVE
PH: 7 (ref 4.6–8.0)
PROTEIN: NEGATIVE mg/dL
SPECIFIC GRAVITY, URINE: 1.005 (ref 1.003–1.035)
Urobilinogen, UR: 0.2 mg/dL (ref 0.2–1)
WBC, UA: NEGATIVE (ref 0–2)

## 2013-10-09 MED ORDER — HEPARIN SOD (PORK) LOCK FLUSH 100 UNIT/ML IV SOLN
500.0000 [IU] | Freq: Once | INTRAVENOUS | Status: AC
Start: 2013-10-09 — End: 2013-10-09
  Administered 2013-10-09: 500 [IU] via INTRAVENOUS
  Filled 2013-10-09: qty 5

## 2013-10-09 MED ORDER — SODIUM CHLORIDE 0.9 % IV SOLN: Freq: Once | INTRAVENOUS | Status: DC

## 2013-10-09 MED ORDER — POTASSIUM CHLORIDE CRYS ER 20 MEQ PO TBCR: EXTENDED_RELEASE_TABLET | ORAL | Status: DC

## 2013-10-09 MED ORDER — MAGIC MOUTHWASH: 5.0000 mL | Freq: Four times a day (QID) | ORAL | Status: DC

## 2013-10-09 MED ORDER — NYSTATIN 100000 UNIT/ML MT SUSP: 5.0000 mL | Freq: Four times a day (QID) | OROMUCOSAL | Status: DC

## 2013-10-09 MED ORDER — SODIUM CHLORIDE 0.9 % IV SOLN
Freq: Once | INTRAVENOUS | Status: AC
  Administered 2013-10-09: 17:00:00 via INTRAVENOUS

## 2013-10-09 MED ORDER — ONDANSETRON 16 MG/50ML IVPB (CHCC): 16.0000 mg | Freq: Once | INTRAVENOUS | Status: DC

## 2013-10-09 MED ORDER — ONDANSETRON 16 MG/50ML IVPB (CHCC)
16.0000 mg | Freq: Once | INTRAVENOUS | Status: AC
  Administered 2013-10-09: 16 mg via INTRAVENOUS

## 2013-10-09 MED ORDER — SODIUM CHLORIDE 0.9 % IJ SOLN
10.0000 mL | INTRAMUSCULAR | Status: DC | PRN
  Administered 2013-10-09: 10 mL via INTRAVENOUS
  Filled 2013-10-09: qty 10

## 2013-10-09 MED ORDER — CIPROFLOXACIN HCL 500 MG PO TABS: 500.0000 mg | ORAL_TABLET | Freq: Two times a day (BID) | ORAL | Status: DC

## 2013-10-09 NOTE — Patient Instructions (Signed)
Mouth sores/Thrush:   1. take Nystatin swish and swallow three to four times a day;  2. Magic mouthwash  Swish and spit 3 - times a day 3. Natural dentist mouth rinses three times a day 4. Continue biotene as you are  DIARRHEA:  1. Take immodium after every bowel movement until no diarrheal stool is noted and BM are normal size and consistency 2. Sitz baths; aveeno sitz baths, you can get it from [employer]  3. Wash after every bowel movement  UTI:  1. Take plenty of fluids, water, gatorade, coconut water 2. Drink cranberry juice to take the sting away 3. Take cipro (antibiotic) 500 mg twice a day for 7 - 10 days.   DEHYDRATION:  1. You will receive IVF today and on Saturday 2. Nausea: zofran today and tomorrow in your IV, continue to take zofran/compazine at home  HYPOKALEMIA: (low potassium)  1. K-dur 20 meq by three times a day for 5 days then 1 every day for 5 days.  PLEASE CALL WITH ANY PROBLEMS AT [phone]

## 2013-10-09 NOTE — Progress Notes (Signed)
OFFICE PROGRESS NOTE  CC  Tina Kern., DO [address] [place] [address] Dr. Rolm Bookbinder  Dr. Dr. Eppie Gibson  DIAGNOSIS: 53 year old female with new diagnosis of HER-2 positive right breast cancer  Malignant neoplasm of lower-outer quadrant of female breast   Primary site: Breast (Right)   Staging method: AJCC 7th Edition   Clinical: Stage IIA (T2, NX, cM0) signed by [name], MD on 09/20/2013  9:50 PM   Summary: Stage IIA (T2, NX, cM0)  PRIOR THERAPY:  1. Underwent screening mammogram. On the right side she was noted to have a mass with microcalcifications. Ultrasound in February 2015 showed a 1.2 x 1.1 x 1.0 cm rounded hypoechoic mass with irregular margins in the 8:00 position 4 cm from the nipple. No abnormal axillary lymph nodes were noted. She had MRI of the breasts performed that showed in the lower outer right breast 2.2 x 1.3 x 1.5 cm Irregular mass. In the upper outer left breast there was a 7 x 5 x 8 cm indeterminate enhancing mass. Also another 9 x 7 x 6 mm indeterminant enhancing mass at 9:00 in the left breast was noted.there are also noted to be ominent cortices of right axillary lymph nodes. Patient has undergone biopsy of these. On the right patient had biopsy performed of the 1.2 cm mass. The pathology revealed grade 3 invasive ductal carcinoma with ductal carcinoma in situ. Tumor was ER +70% PR +6% HER-2/neu was amplified and proliferation marker Ki-67 70%  2. Referred to genetic counseling:   2. Neoadjuvant TCH/Perjeta every 3 weeks x 6 cycles beginning 10/02/13 followed by breast surgery   CURRENT THERAPY: cycle 1 day 8 of TCH/P  INTERVAL HISTORY: Tina [name] 53 y.o. female returns for follow up visit. She does have some sores in her mouth. She may even have some thrush and looking at her mouth. She has been a little bit of nauseous. She's also had diarrhea. And she has had some rectal pain. She's not had any fevers chills or night  sweats. She has been not drinking as well. She is also complaining of dysuria. And she has had some transient tingling in her hands and feet. Remainder of the 10 point review of systems is negative.  MEDICAL HISTORY: Past Medical History  Diagnosis Date  . Leukocytopenia   . Hypertension   . Degenerative joint disease   . Leukopenia 06/16/2013  . Needs flu shot 06/17/2013  . Cancer of right breast 09/03/13    Invasive Ductal Carcinoma/Ductal Carcinoma Insitu    ALLERGIES:  is allergic to adhesive and nickel.  MEDICATIONS:  Current Outpatient Prescriptions  Medication Sig Dispense Refill  . ascorbic acid (VITAMIN C) 1000 MG tablet Take 1,000 mg by mouth daily.      Marland Kitchen BIOTIN 5000 PO Take by mouth daily.       . Cholecalciferol (VITAMIN D3) 10000 UNITS capsule Take 10,000 Units by mouth daily.      . Coenzyme Q10 (CO Q 10 PO) Take by mouth daily.      Marland Kitchen dexamethasone (DECADRON) 4 MG tablet Take 2 tablets (8 mg total) by mouth 2 (two) times daily. Start the day before Taxotere. Then again the day after chemo for 3 days.  30 tablet  1  . L-ARGININE PO Take by mouth daily.      Marland Kitchen lidocaine-prilocaine (EMLA) cream Apply topically as needed.  30 g  6  . lisinopril-hydrochlorothiazide (PRINZIDE,ZESTORETIC) 20-12.5 MG per tablet Take 1 tablet  by mouth 2 (two) times daily.      Marland Kitchen LORazepam (ATIVAN) 0.5 MG tablet Take 1 tablet (0.5 mg total) by mouth every 6 (six) hours as needed (Nausea or vomiting).  30 tablet  0  . ondansetron (ZOFRAN) 8 MG tablet Take 1 tablet (8 mg total) by mouth 2 (two) times daily. Start the day after chemo for 3 days. Then take as needed for nausea or vomiting.  30 tablet  1  . oxyCODONE (OXY IR/ROXICODONE) 5 MG immediate release tablet Take 1 tablet (5 mg total) by mouth every 4 (four) hours as needed for severe pain.  60 tablet  0  . potassium chloride SA (K-DUR,KLOR-CON) 20 MEQ tablet Take 1 tablet (20 mEq total) by mouth once. X 3 days, then stop  5 tablet  0  .  prochlorperazine (COMPAZINE) 10 MG tablet Take 1 tablet (10 mg total) by mouth every 6 (six) hours as needed (Nausea or vomiting).  30 tablet  1  . VITAMIN E PO Take 1 capsule by mouth daily.       Marland Kitchen aspirin 81 MG tablet Take 81 mg by mouth daily.      . Calcium Carbonate-Vit D-Min (CALTRATE 600+D PLUS MINERALS PO) Take 1 tablet by mouth daily.      Marland Kitchen OVER THE COUNTER MEDICATION Take 1 tablet by mouth daily. Trace Minerals      . Probiotic Product (TRUBIOTICS) CAPS Take 1 capsule by mouth daily.       No current facility-administered medications for this visit.    SURGICAL HISTORY:  Past Surgical History  Procedure Laterality Date  . Cesarean section    . Carpal tunnel release    . Trigger finger release      REVIEW OF SYSTEMS:  Pertinent items are noted in HPI.     PHYSICAL EXAMINATION: Blood pressure 127/81, pulse 93, temperature 99.2 F (37.3 C), temperature source Oral, resp. rate 18, height '5\' 4"'  (1.626 m), weight 171 lb 3.2 oz (77.656 kg), last menstrual period 06/25/2013. Body mass index is 29.37 kg/(m^2). ECOG PERFORMANCE STATUS: 0 - Asymptomatic  Well-developed nourished female in no acute distress HEENT exam: EOMI PERRLA sclerae anicteric no conjunctival pallor oral mucosa is moist neck supple no palpable cervical supraclavicular or axillary adenopathy Lungs: Clear to auscultation and percussion Cardiovascular: Regular rate rhythm no murmurs gallops or rubs Abdomen: Soft nontender nondistended bowel sounds are present no hepatosplenomegaly or palpable masses Extremities: No edema clubbing or cyanosis, pedal pulses are present Neuro: Alert oriented x3 DTRs +4 strength is symmetrical in upper and lower extremities, gait normal, but is grossly normal Skin: Warm and moist, good capillary filling, no rashes. Breasts: breasts appear normal, no suspicious masses, no skin or nipple changes or axillary nodes.     LABORATORY DATA: Lab Results  Component Value Date   WBC  15.2* 10/09/2013   HGB 13.0 10/09/2013   HCT 38.0 10/09/2013   MCV 91.3 10/09/2013   PLT 234 10/09/2013      Chemistry      Component Value Date/Time   NA 133* 10/09/2013 1549   NA 138 09/25/2013 1320   K 3.0* 10/09/2013 1549   K 3.6* 09/25/2013 1320   CL 98 09/25/2013 1320   CL 104 06/16/2012 1303   CO2 25 10/09/2013 1549   CO2 27 09/25/2013 1320   BUN 10.8 10/09/2013 1549   BUN 9 09/25/2013 1320   CREATININE 0.9 10/09/2013 1549   CREATININE 0.82 09/25/2013 1320  Component Value Date/Time   CALCIUM 9.1 10/09/2013 1549   CALCIUM 10.0 09/25/2013 1320   ALKPHOS 64 10/09/2013 1549   ALKPHOS 42 10/04/2012 1830   AST 12 10/09/2013 1549   AST 32 10/04/2012 1830   ALT 16 10/09/2013 1549   ALT 27 10/04/2012 1830   BILITOT 0.41 10/09/2013 1549   BILITOT 0.7 10/04/2012 1830       RADIOGRAPHIC STUDIES:  Dg Eye Foreign Body  09/11/2013   CLINICAL 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  FINDINGS: Water's views with eyes deviated toward the left and toward the right were obtained. No radiopaque foreign body in the orbits identified. Paranasal sinuses are clear except for a small right frontal osteoma. No fracture or dislocation.  IMPRESSION: No evidence of metallic foreign body within the orbits. Small right frontal sinus osteoma, a benign finding.   Electronically Signed   By: Lowella Grip M.D.   On: 09/11/2013 09:23   Dg Chest 2 View  09/28/2013   CLINICAL DATA:  Chest pain since Port-A-Cath placement 3-4 days ago  EXAM: CHEST  2 VIEW  COMPARISON:  IR FLUORO GUIDE CV LINE*R* dated 09/25/2013; DG CHEST 2 VIEW dated 10/11/2010  FINDINGS: Heart size and vascular pattern are normal. Lungs are clear. No consolidation effusion or pneumothorax. Port-A-Cath is identified with tip over the cavoatrial junction.  IMPRESSION: No acute abnormalities   Electronically Signed   By: Skipper Cliche M.D.   On: 09/28/2013 14:19   Ct Chest W  Contrast  09/30/2013   CLINICAL DATA:  Breast cancer diagnosed 2015. Chemotherapy to start. Chest pain  EXAM: CT CHEST, ABDOMEN, AND PELVIS WITH CONTRAST  TECHNIQUE: Multidetector CT imaging of the chest, abdomen and pelvis was performed following the standard protocol during bolus administration of intravenous contrast.  CONTRAST:  161m OMNIPAQUE IOHEXOL 300 MG/ML  SOLN  COMPARISON:  NM PET IMAGE INITIAL (PI) SKULL BASE TO THIGH dated 09/30/2013  FINDINGS: CT CHEST FINDINGS  There is a 1.5 cm mass in hte the posterior right breast. Rounded right axial lymph nodes are not pathologic by size criteria however to have a rounded morphology. Lymph nodes extend into the right subpectoral is location with a 6 mm node (image 14). 11 mm node in the left axilla (image 17).  There is no supraclavicular or infraclavicular adenopathy. No clear infraclavicular supraclavicular adenopathy. There is enlargement of a left internal mammary vein on the right without clear internal mammary adenopathy.  There several borderline enlarged left axillary lymph nodes. For example 10 mm node image 14, series 2.  There is a soft tissue within the anterior mediastinum likely representing residual thymus. No mediastinal lymphadenopathy. No pericardial fluid.  Review of the lung parenchyma demonstrates no suspicious findings No nodularity.  CT ABDOMEN AND PELVIS FINDINGS  Low -density lesion the posterior right hepatic lobe measuring 2.7 cm. This has peripheral enhancement typical of hemangioma. Gallbladder, pancreas, spleen, adrenal glands, and kidneys are normal. There are bilateral nonenhancing renal cyst.  Stomach, small bowel, appendix, cecum normal. The colon and rectosigmoid colon are normal.  Abdominal aorta is normal caliber. No retroperitoneal or periportal lymphadenopathy.  No free fluid the pelvis. The uterus is normal. There is a fatty lesion in the left adnexal with a small calcification consistent with a dermoid lesion and measures  2.7 x 2.5 cm. Left ovary is normal. No pelvic lymphadenopathy. No aggressive osseous lesion.  IMPRESSION: 1. Small  right breast mass consistent prior breast carcinoma. 2. Borderline enlarged axillary lymph nodes are mildly hypermetabolic on comparison PET-CT scan. 3. No internal mammary metastasis or mediastinal metastasis. 4. Hemangioma within the liver. 5. Mature teratoma associated with the right ovary.   Electronically Signed   By: Suzy Bouchard M.D.   On: 09/30/2013 16:28   Mr Breast Bilateral W Wo Contrast  09/14/2013   ADDENDUM REPORT: 09/14/2013 08:23  ADDENDUM: The mass in the upper outer left breast, middle depth, measures 7 x 5 x 8 mm, not 7 x 5 x 8 cm.   Electronically Signed   By: Enrique Sack M.D.   On: 09/14/2013 08:23   09/14/2013   CLINICAL DATA:  New diagnosis of invasive ductal carcinoma and ductal carcinoma in situ in the right breast at 8 o'clock.  EXAM: BILATERAL BREAST MRI WITH AND WITHOUT CONTRAST  LABS:  BUN and creatinine were obtained on site at [facility] at  [address]  Results:  BUN 9 mg/dL,  Creatinine 0.7 mg/dL.  TECHNIQUE: Multiplanar, multisequence MR images of both breasts were obtained prior to and following the intravenous administration of 7m of MultiHance.  THREE-DIMENSIONAL MR IMAGE RENDERING ON INDEPENDENT WORKSTATION:  Three-dimensional MR images were rendered by post-processing of the original MR data on an independent workstation. The three-dimensional MR images were interpreted, and findings are reported in the following complete MRI report for this study. Three dimensional images were evaluated at the independent DynaCad workstation  COMPARISON:  Previous exams  FINDINGS: Breast composition: c:  Heterogeneous fibroglandular tissue  Background parenchymal enhancement: Moderate  Right breast: In the lower outer right breast, there is an irregular enhancing mass measuring 2.2 x 1.3 by 1.5 cm. This is consistent with the recently biopsied invasive  ductal carcinoma. Within this mass is susceptibility artifact, consistent with biopsy clip. No additional areas of abnormal enhancement are identified  Left breast: In the upper-outer left breast, middle depth, there is an oval circumscribed mass measuring 7 x 5 x 8 cm. This mass has mixed kinetics, which includes washout kinetics. At 9 o'clock in the left breast, there is an oval enhancing mass measuring 9 x 7 x 6 mm. There is associated plateau kinetics.  Lymph nodes: Right axillary lymph nodes with prominent cortices are identified, with the largest node measuring up to 1 cm in short axis.  Ancillary findings:  None.  IMPRESSION: 1. A 2.2 x 1.3 x 1.5 cm irregular mass in lower outer right breast consistent with the recently biopsied invasive ductal carcinoma. 2. A 7 x 5 x 8 cm indeterminate enhancing mass in the upper-outer left breast. 3. A 9 x 7 x 6 mm indeterminate enhancing mass at 9 o'clock in the left breast. 4. Prominent cortices of right axillary lymph nodes.  RECOMMENDATION: Ultrasound is recommended for evaluation of the masses in the left breast. If seen sonographically, ultrasound-guided biopsies are recommended. If these masses are not seen on ultrasound, MRI guided biopsies are recommended. Second-look ultrasound of the right axilla is also recommended given the MRI finding of right axillary nodes with prominent cortices.  BI-RADS CATEGORY  4: Suspicious abnormality - biopsy should be considered.  Electronically Signed: By: MDonavan BurnetM.D. On: 09/11/2013 14:26   Nm Pet Image Initial (pi) Skull Base To Thigh  09/30/2013   CLINICAL DATA:  Initial treatment strategy for breast carcinoma.  EXAM: NUCLEAR MEDICINE PET SKULL BASE TO THIGH  FASTING BLOOD GLUCOSE:  Value: 84 mg/dl  TECHNIQUE: 9.3 mCi  F-18 FDG was injected intravenously. Full-ring PET imaging was performed from the skull base to thigh after the radiotracer. CT data was obtained and used for attenuation correction and anatomic  localization.  COMPARISON:  CT 09/30/2013  FINDINGS: NECK  No hypermetabolic lymph nodes in the neck.  CHEST  There is a hypermetabolic mass in the right breast with SUV max 9.4. There are small right axillary lymph nodes which are not pathologic enlarged but do have mild metabolic activity with SUV max less than 2. There is similar small lymph nodes within the left axilla.  No hypermetabolic internal mammary nodes. No hypermetabolic mediastinal nodes.  ABDOMEN/PELVIS  No abnormal hypermetabolic activity within the liver, pancreas, adrenal glands, or spleen. No hypermetabolic lymph nodes in the abdomen or pelvis. No abnormal metabolic activity associated with the right adnexal dermoid lesion.  SKELETON  No focal hypermetabolic activity to suggest skeletal metastasis.  IMPRESSION: 1. Hypermetabolic right breast mass. 2. Bilateral minimally hypermetabolic axillary lymph nodes are indeterminate. If right axillary lymph nodes are positive on resection, consider biopsy of the left axillary nodes. 3. No evidence of mediastinal nodal metastasis. 4. No  distant metastasis.   Electronically Signed   By: Suzy Bouchard M.D.   On: 09/30/2013 16:29   Ir Fluoro Guide Cv Line Right  09/25/2013   INDICATION: History of right-sided breast cancer, please place Port a catheter for chemotherapy administration  EXAM: IMPLANTED PORT A CATH PLACEMENT WITH ULTRASOUND AND FLUOROSCOPIC GUIDANCE  MEDICATIONS: Ancef 2 gm IV; IV antibiotic was given in an appropriate time interval prior to skin puncture.  ANESTHESIA/SEDATION: Versed 4 mg IV; Fentanyl 100 mcg IV;  Total Moderate Sedation Time  30  minutes.  CONTRAST:  None  COMPARISON:  None.  FLUOROSCOPY TIME:  36 seconds.  PROCEDURE: The procedure, risks, benefits, and alternatives were explained to the patient. Questions regarding the procedure were encouraged and answered. The patient understands and consents to the procedure.  Given history of right-sided breast cancer, the decision  was made to place a left anterior chest wall Port a Catheter. As such, the left neck and chest were prepped with chlorhexidine in a sterile fashion, and a sterile drape was applied covering the operative field. Maximum barrier sterile technique with sterile gowns and gloves were used for the procedure. A timeout was performed prior to the initiation of the procedure. Local anesthesia was provided with 1% lidocaine with epinephrine.  After creating a small venotomy incision, a micropuncture kit was utilized to access the internal jugular vein under direct, real-time ultrasound guidance. Ultrasound image documentation was performed. The microwire was kinked to measure appropriate catheter length.  A subcutaneous port pocket was then created along the upper chest wall utilizing a combination of sharp and blunt dissection. The pocket was irrigated with sterile saline. A single lumen ISP power injectable port was chosen for placement. The 8 Fr catheter was tunneled from the port pocket site to the venotomy incision. The port was placed in the pocket. The external catheter was trimmed to appropriate length. At the venotomy, an 8 Fr peel-away sheath was placed over a guidewire under fluoroscopic guidance. The catheter was then placed through the sheath and the sheath was removed. Final catheter positioning was confirmed and documented with a fluoroscopic spot radiograph. The port was accessed with a Huber needle, aspirated and flushed with heparinized saline.  The venotomy site was closed with an interrupted 4-0 Vicryl suture. The port pocket incision was closed with interrupted 2-0 Vicryl suture and the  skin was opposed with a running subcuticular 4-0 Vicryl suture. Dermabond and Steri-strips were applied to both incisions. Dressings were placed. The patient tolerated the procedure well without immediate post procedural complication.  COMPLICATIONS: None immediate  FINDINGS: After catheter placement, the tip lies within  the superior cavoatrial junction. The catheter aspirates and flushes normally and is ready for immediate use.  IMPRESSION: Successful placement of a left internal jugular approach power injectable Port-A-Cath. The catheter is ready for immediate use.   Electronically Signed   By: Sandi Mariscal M.D.   On: 09/25/2013 17:14   Ir US Guide Vasc Access Left  09/25/2013   INDICATION: History of right-sided breast cancer, please place Port a catheter for chemotherapy administration  EXAM: IMPLANTED PORT A CATH PLACEMENT WITH ULTRASOUND AND FLUOROSCOPIC GUIDANCE  MEDICATIONS: Ancef 2 gm IV; IV antibiotic was given in an appropriate time interval prior to skin puncture.  ANESTHESIA/SEDATION: Versed 4 mg IV; Fentanyl 100 mcg IV;  Total Moderate Sedation Time  30  minutes.  CONTRAST:  None  COMPARISON:  None.  FLUOROSCOPY TIME:  36 seconds.  PROCEDURE: The procedure, risks, benefits, and alternatives were explained to the patient. Questions regarding the procedure were encouraged and answered. The patient understands and consents to the procedure.  Given history of right-sided breast cancer, the decision was made to place a left anterior chest wall Port a Catheter. As such, the left neck and chest were prepped with chlorhexidine in a sterile fashion, and a sterile drape was applied covering the operative field. Maximum barrier sterile technique with sterile gowns and gloves were used for the procedure. A timeout was performed prior to the initiation of the procedure. Local anesthesia was provided with 1% lidocaine with epinephrine.  After creating a small venotomy incision, a micropuncture kit was utilized to access the internal jugular vein under direct, real-time ultrasound guidance. Ultrasound image documentation was performed. The microwire was kinked to measure appropriate catheter length.  A subcutaneous port pocket was then created along the upper chest wall utilizing a combination of sharp and blunt dissection. The  pocket was irrigated with sterile saline. A single lumen ISP power injectable port was chosen for placement. The 8 Fr catheter was tunneled from the port pocket site to the venotomy incision. The port was placed in the pocket. The external catheter was trimmed to appropriate length. At the venotomy, an 8 Fr peel-away sheath was placed over a guidewire under fluoroscopic guidance. The catheter was then placed through the sheath and the sheath was removed. Final catheter positioning was confirmed and documented with a fluoroscopic spot radiograph. The port was accessed with a Huber needle, aspirated and flushed with heparinized saline.  The venotomy site was closed with an interrupted 4-0 Vicryl suture. The port pocket incision was closed with interrupted 2-0 Vicryl suture and the skin was opposed with a running subcuticular 4-0 Vicryl suture. Dermabond and Steri-strips were applied to both incisions. Dressings were placed. The patient tolerated the procedure well without immediate post procedural complication.  COMPLICATIONS: None immediate  FINDINGS: After catheter placement, the tip lies within the superior cavoatrial junction. The catheter aspirates and flushes normally and is ready for immediate use.  IMPRESSION: Successful placement of a left internal jugular approach power injectable Port-A-Cath. The catheter is ready for immediate use.   Electronically Signed   By: Sandi Mariscal M.D.   On: 09/25/2013 17:14   Mm Digital Diagnostic Unilat L  09/16/2013   CLINICAL DATA:  Patient status post ultrasound-guided core  needle biopsy left breast mass  EXAM: DIAGNOSTIC LEFT MAMMOGRAM POST ULTRASOUND BIOPSY  COMPARISON:  Previous exams  FINDINGS: Mammographic images were obtained following ultrasound guided biopsy of left breast mass. Wing shaped biopsy marking clip within the upper-outer quadrant of the left breast is in appropriate position.  IMPRESSION: Appropriate position biopsy marking clip left breast status post  ultrasound-guided core needle biopsy.  Final Assessment: Post Procedure Mammograms for Marker Placement   Electronically Signed   By: Lovey Newcomer M.D.   On: 09/16/2013 15:12   US Breast Ltd Uni Left Inc Axilla  09/16/2013   CLINICAL DATA:  Patient with recent diagnosis right breast carcinoma. Suspicious enhancing mass within the upper-outer quadrant left breast on prior MRI. Additional questioned mass within the 9 o'clock position left breast on prior MRI.  EXAM: ULTRASOUND OF THE LEFT BREAST  COMPARISON:  Breast MRI 09/14/2013; mammograms dating back to 10/11/2008.  FINDINGS: On physical exam,I palpate no discrete mass within the upper-outer quadrant of the left breast or within the 9 o'clock position of the left breast.  Ultrasound is performed, showing a 0.6 x 0.5 x 0.9 cm oval circumscribed hypoechoic mass within the left breast 2 o'clock position 4 cm from the nipple corresponding with enhancing mass identified on prior breast MRI.  No concerning mass identified within the left breast 9 o'clock position to correspond with the questioned enhancing tissue in this location on prior MRI.  IMPRESSION: 1. Hypoechoic mass within the upper-outer quadrant left breast corresponds with MRI finding. Ultrasound-guided core needle biopsy will be performed 09/16/2013. 2. No concerning lesion identified within the 9 o'clock position left breast on ultrasound. Upon additional review of the left MRI, in agreement with colleagues, this previously described area is felt to represent normal fibroglandular breast tissue.  RECOMMENDATION: Ultrasound-guided core needle biopsy left breast mass.  I have discussed the findings and recommendations with the patient. Results were also provided in writing at the conclusion of the visit. If applicable, a reminder letter will be sent to the patient regarding the next appointment.  BI-RADS CATEGORY  4: Suspicious abnormality - biopsy should be considered.   Electronically Signed   By: Lovey Newcomer M.D.   On: 09/16/2013 15:10   US Breast Ltd Uni Right Inc Axilla  09/16/2013   CLINICAL DATA:  Patient with recent diagnosis right breast carcinoma. Borderline enlarged right axillary lymph node on breast MRI.  EXAM: ULTRASOUND OF THE RIGHT AXILLA  COMPARISON:  Breast MRI 09/14/2013; mammograms dating back to 10/11/2008.  FINDINGS: On physical exam,I palpate no definite mass within the right axillary region.  Ultrasound is performed, showing a cortically thickened right axillary lymph node corresponding with lymph node identified on breast MRI.  IMPRESSION: Right axillary lymph node with cortical thickening and patient with recent diagnosis of right breast carcinoma.  RECOMMENDATION: Ultrasound-guided core needle biopsy thickened right axillary lymph node. This will be performed 09/16/2013.  I have discussed the findings and recommendations with the patient. Results were also provided in writing at the conclusion of the visit. If applicable, a reminder letter will be sent to the patient regarding the next appointment.  BI-RADS CATEGORY  4: Suspicious abnormality - biopsy should be considered.   Electronically Signed   By: Lovey Newcomer M.D.   On: 09/16/2013 15:04   [place] Lt Breast Bx W Loc Dev 1st Lesion Img Bx Spec US Guide  09/17/2013   ADDENDUM REPORT: 09/17/2013 12:06  ADDENDUM: Pathology revealed a benign lymph node and benign breast  tissue from the left breast biopsy. This was found to be concordant by Dr. Lovey Newcomer. Pathology was relayed to the patient by telephone. The patient reported doing well after the biopsy. Post biopsy instructions were reviewed and her questions were answered. She was encouraged to call [facility] for any additional concerns. She was recently diagnosed with right breast cancer and is told to follow her treatment plan.  Pathology results reported by Susa Raring RN, BSN on September 17, 2013.   Electronically Signed   By: Lovey Newcomer M.D.   On:  09/17/2013 12:06   09/17/2013   CLINICAL DATA:  Patient with recent diagnosis right breast carcinoma. Suspicious mass within the upper-outer quadrant left breast on prior MRI and identified on ultrasound.  EXAM: ULTRASOUND GUIDED LEFT BREAST CORE NEEDLE BIOPSY WITH VACUUM ASSIST  COMPARISON:  Previous exams.  PROCEDURE: I met with the patient and we discussed the procedure of ultrasound-guided biopsy, including benefits and alternatives. We discussed the high likelihood of a successful procedure. We discussed the risks of the procedure including infection, bleeding, tissue injury, clip migration, and inadequate sampling. Informed written consent was given. The usual time-out protocol was performed immediately prior to the procedure.  Using sterile technique and 2% Lidocaine as local anesthetic, under direct ultrasound visualization, a 12 gauge vacuum-assisteddevice was used to perform biopsy of a hypoechoic mass within the left breast using a lateral approach. At the conclusion of the procedure, a wing shaped tissue marker clip was deployed into the biopsy cavity. Follow-up 2-view mammogram was performed and dictated separately.  IMPRESSION: Ultrasound-guided biopsy of left breast mass. No apparent complications.  Electronically Signed: By: Lovey Newcomer M.D. On: 09/16/2013 15:11   [place] Rt Breast Bx W Loc Dev 1st Lesion Img Bx Spec US Guide  09/17/2013   ADDENDUM REPORT: 09/17/2013 12:04  ADDENDUM: Pathology revealed a benign right axillary lymph node. This was found to be concordant by Dr. Lovey Newcomer. Pathology was relayed to the patient by telephone. The patient reported doing well after the biopsy. Post biopsy instructions were reviewed and her questions were answered. She was encouraged to call [facility] for any additional concerns. She was recently diagnosed with right breast cancer and is told to follow her treatment plan.  Pathology results reported by Susa Raring RN, BSN on  September 17, 2013.   Electronically Signed   By: Lovey Newcomer M.D.   On: 09/17/2013 12:04   09/17/2013   CLINICAL DATA:  Patient with recent diagnosis right breast carcinoma. Cortically thickened right axillary lymph node on prior MRI and ultrasound.  EXAM: ULTRASOUND GUIDED CORE NEEDLE BIOPSY OF A RIGHT AXILLARY NODE  COMPARISON:  Previous exams.  FINDINGS: I met with the patient and we discussed the procedure of ultrasound-guided biopsy, including benefits and alternatives. We discussed the high likelihood of a successful procedure. We discussed the risks of the procedure, including infection, bleeding, tissue injury, clip migration, and inadequate sampling. Informed written consent was given. The usual time-out protocol was performed immediately prior to the procedure.  Using sterile technique and 2% Lidocaine as local anesthetic, under direct ultrasound visualization, a 14 gauge spring-loaded device was used to perform biopsy of a cortically thickened right axillary lymph node using a lateral approach.  IMPRESSION: Ultrasound guided biopsy of cortically thickened right axillary lymph node. No apparent complications.  Electronically Signed: By: Lovey Newcomer M.D. On: 09/16/2013 15:05   Ir Radiologist Eval & Mgmt  09/28/2013  EXAM: ESTABLISHED PATIENT OFFICE VISIT -LEVEL II (96045)  HISTORY OF PRESENT ILLNESS: Tina [name] is a 53 year old black female with history of right breast carcinoma and recently placed left internal jugular Port-A-Cath on 09/25/2013. The procedure was performed without immediate complications. Patient called the IR service today to reported feeling of fullness in the left lateral and anterior neck region exacerbated with bending forward. Patient states her symptoms began over this past weekend. She denies acute substernal pain, fevers, nausea, vomiting or diaphoresis. The patient has taken ibuprofen with some relief of symptoms.  CHIEF COMPLAINT: Anterior / left lateral neck fullness   PHYSICAL EXAMINATION: On exam today both the Port-A-Cath and left internal jugular access sites are clean and dry with minimal tenderness, no evidence of erythema/edema/drainage. Chest is clear to auscultation bilaterally, heart with regular rate and rhythm. There is no appreciable neck or upper chest edema. Limited ultrasound of the left internal jugular vein shows no evidence of thrombosis but diameter is small. Images were reviewed by Dr. Aletta Edouard.  ASSESSMENT AND PLAN: Patient with history of right breast carcinoma, status post left internal jugular Port-A-Cath placement on 09/25/2013. Now with fullness in the left lateral and anterior neck regions. At this time the patient's physical exam is unremarkable. There is no evidence of left internal jugular vein thrombosis by limited ultrasound but diameter is small. Recommendations at this time are for conservative care and avoidance of activities that exacerbate the above symptoms. The patient is scheduled for CT chest abdomen and pelvis on 09/30/2013 and follow-up with Dr. Humphrey Rolls on 10/02/2013. Patient was told to contact the Oncology or Interventional Radiology Services in the interim should symptoms worsen, especially with regards to any upper extremity/facial/neck/upper chest edema. Dr. Kathlene Cote was present during the above evaluation.  Read by: Rowe Robert ,P.A.-C.   Electronically Signed   By: Aletta Edouard M.D.   On: 09/28/2013 15:45    ASSESSMENT/PLAN: 53 year old female with  1.StageII (T2 NX) invasive ductal carcinoma with DCIS of the right breast. Biopsy revealed tumor to be ER+PR+ HER-2/neu positive with proliferation marker Ki-67 70%   2. Neoadjuvant Chemotherapy:s/p cycle 1 of TCH/P. patient is now day 8. She is weak tired and fatigued. She's having some nausea but she is taking her antiemetics. She is also dehydrated. We will cover him on fluids today.  3. Mouth sores/Thrush:   1. take Nystatin swish and swallow three to four times a  day;  2. Magic mouthwash  Swish and spit 3 - times a day 3. Natural dentist mouth rinses three times a day 4. Continue biotene as you are  4. DIARRHEA:  1. Take immodium after every bowel movement until no diarrheal stool is noted and BM are normal size and consistency 2. Sitz baths; aveeno sitz baths, you can get it from [employer]  3. Wash after every bowel movement  5. UTI:  1. Take plenty of fluids, water, gatorade, coconut water 2. Drink cranberry juice to take the sting away 3. Take cipro (antibiotic) 500 mg twice a day for 7 - 10 days.   5. DEHYDRATION:  1. You will receive IVF today and on Saturday 2. Nausea: zofran today and tomorrow in your IV, continue to take zofran/compazine at home  7.HYPOKALEMIA: (low potassium)  1. K-dur 20 meq by three times a day for 5 days then 1 every day for 5 days.  All questions were answered. The patient knows to call the clinic with any problems, questions or concerns. We can certainly  see the patient much sooner if necessary.  I spent 30 minutes counseling the patient face to face. The total time spent in the appointment was 40 minutes.    Marcy Panning, MD Medical/Oncology [facility] [phone] (beeper) [phone] (Office)  10/09/2013, 4:23 PM

## 2013-10-09 NOTE — Telephone Encounter (Signed)
added IVF's for today and tomorrow. pt in inf and will be given appt for 3/14 in inf. no other orders per 3/13 pof's (2)

## 2013-10-09 NOTE — Patient Instructions (Signed)
Dehydration, Adult Dehydration is when you lose more fluids from the body than you take in. Vital organs like the kidneys, brain, and heart cannot function without a proper amount of fluids and salt. Any loss of fluids from the body can cause dehydration.  CAUSES   Vomiting.  Diarrhea.  Excessive sweating.  Excessive urine output.  Fever. SYMPTOMS  Mild dehydration  Thirst.  Dry lips.  Slightly dry mouth. Moderate dehydration  Very dry mouth.  Sunken eyes.  Skin does not bounce back quickly when lightly pinched and released.  Dark urine and decreased urine production.  Decreased tear production.  Headache. Severe dehydration  Very dry mouth.  Extreme thirst.  Rapid, weak pulse (more than 100 beats per minute at rest).  Cold hands and feet.  Not able to sweat in spite of heat and temperature.  Rapid breathing.  Blue lips.  Confusion and lethargy.  Difficulty being awakened.  Minimal urine production.  No tears. DIAGNOSIS  Your caregiver will diagnose dehydration based on your symptoms and your exam. Blood and urine tests will help confirm the diagnosis. The diagnostic evaluation should also identify the cause of dehydration. TREATMENT  Treatment of mild or moderate dehydration can often be done at home by increasing the amount of fluids that you drink. It is best to drink small amounts of fluid more often. Drinking too much at one time can make vomiting worse. Refer to the home care instructions below. Severe dehydration needs to be treated at the hospital where you will probably be given intravenous (IV) fluids that contain water and electrolytes. HOME CARE INSTRUCTIONS   Ask your caregiver about specific rehydration instructions.  Drink enough fluids to keep your urine clear or pale yellow.  Drink small amounts frequently if you have nausea and vomiting.  Eat as you normally do.  Avoid:  Foods or drinks high in sugar.  Carbonated  drinks.  Juice.  Extremely hot or cold fluids.  Drinks with caffeine.  Fatty, greasy foods.  Alcohol.  Tobacco.  Overeating.  Gelatin desserts.  Wash your hands well to avoid spreading bacteria and viruses.  Only take over-the-counter or prescription medicines for pain, discomfort, or fever as directed by your caregiver.  Ask your caregiver if you should continue all prescribed and over-the-counter medicines.  Keep all follow-up appointments with your caregiver. SEEK MEDICAL CARE IF:  You have abdominal pain and it increases or stays in one area (localizes).  You have a rash, stiff neck, or severe headache.  You are irritable, sleepy, or difficult to awaken.  You are weak, dizzy, or extremely thirsty. SEEK IMMEDIATE MEDICAL CARE IF:   You are unable to keep fluids down or you get worse despite treatment.  You have frequent episodes of vomiting or diarrhea.  You have blood or green matter (bile) in your vomit.  You have blood in your stool or your stool looks black and tarry.  You have not urinated in 6 to 8 hours, or you have only urinated a small amount of very dark urine.  You have a fever.  You faint. MAKE SURE YOU:   Understand these instructions.  Will watch your condition.  Will get help right away if you are not doing well or get worse. Document Released: 07/16/2005 Document Revised: 10/08/2011 Document Reviewed: 03/05/2011 Exit[employer] Patient Information 2014 [employer].  

## 2013-10-10 ENCOUNTER — Ambulatory Visit (HOSPITAL_BASED_OUTPATIENT_CLINIC_OR_DEPARTMENT_OTHER): Payer: BC Managed Care – PPO

## 2013-10-10 VITALS — BP 127/79 | HR 95 | Temp 98.1°F

## 2013-10-10 DIAGNOSIS — C50419 Malignant neoplasm of upper-outer quadrant of unspecified female breast: Secondary | ICD-10-CM

## 2013-10-10 DIAGNOSIS — R110 Nausea: Secondary | ICD-10-CM

## 2013-10-10 LAB — URINE CULTURE

## 2013-10-10 MED ORDER — SODIUM CHLORIDE 0.9 % IV SOLN
Freq: Once | INTRAVENOUS | Status: AC
  Administered 2013-10-10: 11:00:00 via INTRAVENOUS

## 2013-10-10 MED ORDER — ONDANSETRON 16 MG/50ML IVPB (CHCC)
16.0000 mg | Freq: Once | INTRAVENOUS | Status: AC
  Administered 2013-10-10: 16 mg via INTRAVENOUS

## 2013-10-10 NOTE — Patient Instructions (Signed)
Dehydration, Adult Dehydration is when you lose more fluids from the body than you take in. Vital organs like the kidneys, brain, and heart cannot function without a proper amount of fluids and salt. Any loss of fluids from the body can cause dehydration.  CAUSES   Vomiting.  Diarrhea.  Excessive sweating.  Excessive urine output.  Fever. SYMPTOMS  Mild dehydration  Thirst.  Dry lips.  Slightly dry mouth. Moderate dehydration  Very dry mouth.  Sunken eyes.  Skin does not bounce back quickly when lightly pinched and released.  Dark urine and decreased urine production.  Decreased tear production.  Headache. Severe dehydration  Very dry mouth.  Extreme thirst.  Rapid, weak pulse (more than 100 beats per minute at rest).  Cold hands and feet.  Not able to sweat in spite of heat and temperature.  Rapid breathing.  Blue lips.  Confusion and lethargy.  Difficulty being awakened.  Minimal urine production.  No tears. DIAGNOSIS  Your caregiver will diagnose dehydration based on your symptoms and your exam. Blood and urine tests will help confirm the diagnosis. The diagnostic evaluation should also identify the cause of dehydration. TREATMENT  Treatment of mild or moderate dehydration can often be done at home by increasing the amount of fluids that you drink. It is best to drink small amounts of fluid more often. Drinking too much at one time can make vomiting worse. Refer to the home care instructions below. Severe dehydration needs to be treated at the hospital where you will probably be given intravenous (IV) fluids that contain water and electrolytes. HOME CARE INSTRUCTIONS   Ask your caregiver about specific rehydration instructions.  Drink enough fluids to keep your urine clear or pale yellow.  Drink small amounts frequently if you have nausea and vomiting.  Eat as you normally do.  Avoid:  Foods or drinks high in sugar.  Carbonated  drinks.  Juice.  Extremely hot or cold fluids.  Drinks with caffeine.  Fatty, greasy foods.  Alcohol.  Tobacco.  Overeating.  Gelatin desserts.  Wash your hands well to avoid spreading bacteria and viruses.  Only take over-the-counter or prescription medicines for pain, discomfort, or fever as directed by your caregiver.  Ask your caregiver if you should continue all prescribed and over-the-counter medicines.  Keep all follow-up appointments with your caregiver. SEEK MEDICAL CARE IF:  You have abdominal pain and it increases or stays in one area (localizes).  You have a rash, stiff neck, or severe headache.  You are irritable, sleepy, or difficult to awaken.  You are weak, dizzy, or extremely thirsty. SEEK IMMEDIATE MEDICAL CARE IF:   You are unable to keep fluids down or you get worse despite treatment.  You have frequent episodes of vomiting or diarrhea.  You have blood or green matter (bile) in your vomit.  You have blood in your stool or your stool looks black and tarry.  You have not urinated in 6 to 8 hours, or you have only urinated a small amount of very dark urine.  You have a fever.  You faint. MAKE SURE YOU:   Understand these instructions.  Will watch your condition.  Will get help right away if you are not doing well or get worse. Document Released: 07/16/2005 Document Revised: 10/08/2011 Document Reviewed: 03/05/2011 Exit[employer] Patient Information 2014 [employer].  

## 2013-10-19 ENCOUNTER — Encounter (HOSPITAL_COMMUNITY): Payer: Self-pay

## 2013-10-19 ENCOUNTER — Ambulatory Visit (HOSPITAL_COMMUNITY)
Admission: RE | Admit: 2013-10-19 | Discharge: 2013-10-19 | Disposition: A | Discharge status: Home / Self Care | Payer: BC Managed Care – PPO | Source: Ambulatory Visit | Attending: Internal Medicine | Admitting: Internal Medicine

## 2013-10-19 VITALS — BP 114/82 | HR 98 | Ht 64.0 in | Wt 176.1 lb

## 2013-10-19 DIAGNOSIS — I10 Essential (primary) hypertension: Secondary | ICD-10-CM | POA: Insufficient documentation

## 2013-10-19 DIAGNOSIS — C50519 Malignant neoplasm of lower-outer quadrant of unspecified female breast: Secondary | ICD-10-CM

## 2013-10-19 DIAGNOSIS — C50419 Malignant neoplasm of upper-outer quadrant of unspecified female breast: Secondary | ICD-10-CM | POA: Insufficient documentation

## 2013-10-19 NOTE — Addendum Note (Signed)
Encounter addended by: Kerry Dory, CMA on: 10/19/2013  2:38 PM<BR>     Documentation filed: Patient Instructions Section, Orders

## 2013-10-19 NOTE — Progress Notes (Signed)
Patient ID: Tina [name], female   DOB: [birth year], 53 y.o.   MRN: [id number]    HPI:  Tina [name] is a 53 y/o woman with HTN, DJD and R breast cancer referred by Dr. Humphrey Rolls for enrollment into the cardio-oncology clinic.   Denies any h/o known cardiac issues.  Recently found to have R breast CA. Stage II(T2 NX) invasive ductal carcinoma with DCIS of the right breast. Biopsy revealed tumor to be ER+PR+ HER-2/neu positive with proliferation marker Ki-67 70%   Started neoadjuvant chemotherapy on 10/06/13 with TCH/P.  Does well. No CP or SOB. Occasional feet and hand swelling. + DJD pain.   Echo: 3/15: EF 65% Lateral s' 12.9 cm/s GLS not measured    Review of Systems: [y] = yes, '[ ]'  = no   General: Weight gain '[ ]' ; Weight loss '[ ]' ; Anorexia '[ ]' ; Fatigue '[ ]' ; Fever '[ ]' ; Chills '[ ]' ; Weakness '[ ]'   Cardiac: Chest pain/pressure '[ ]' ; Resting SOB '[ ]' ; Exertional SOB '[ ]' ; Orthopnea '[ ]' ; Pedal Edema [ y]; Palpitations '[ ]' ; Syncope '[ ]' ; Presyncope '[ ]' ; Paroxysmal nocturnal dyspnea'[ ]'   Pulmonary: Cough '[ ]' ; Wheezing'[ ]' ; Hemoptysis'[ ]' ; Sputum '[ ]' ; Snoring '[ ]'   GI: Vomiting'[ ]' ; Dysphagia'[ ]' ; Melena'[ ]' ; Hematochezia '[ ]' ; Heartburn'[ ]' ; Abdominal pain '[ ]' ; Constipation '[ ]' ; Diarrhea '[ ]' ; BRBPR '[ ]'   GU: Hematuria'[ ]' ; Dysuria '[ ]' ; Nocturia'[ ]'   Vascular: Pain in legs with walking '[ ]' ; Pain in feet with lying flat '[ ]' ; Non-healing sores '[ ]' ; Stroke '[ ]' ; TIA '[ ]' ; Slurred speech '[ ]' ;  Neuro: Headaches'[ ]' ; Vertigo'[ ]' ; Seizures'[ ]' ; Paresthesias'[ ]' ;Blurred vision '[ ]' ; Diplopia '[ ]' ; Vision changes '[ ]'   Ortho/Skin: Arthritis '[ ]' ; Joint pain Blue.Reese ]; Muscle pain '[ ]' ; Joint swelling '[ ]' ; Back Pain '[ ]' ; Rash '[ ]'   Psych: Depression'[ ]' ; Anxiety'[ ]'   Heme: Bleeding problems '[ ]' ; Clotting disorders '[ ]' ; Anemia '[ ]'   Endocrine: Diabetes '[ ]' ; Thyroid dysfunction'[ ]'    Past Medical History  Diagnosis Date  . Leukocytopenia   . Hypertension   . Degenerative joint disease   . Leukopenia 06/16/2013  . Needs flu shot 06/17/2013   . Cancer of right breast 09/03/13    Invasive Ductal Carcinoma/Ductal Carcinoma Insitu    Current Outpatient Prescriptions  Medication Sig Dispense Refill  . Alum & Mag Hydroxide-Simeth (MAGIC MOUTHWASH) SOLN Take 5 mLs by mouth 4 (four) times daily.  100 mL  2  . ascorbic acid (VITAMIN C) 1000 MG tablet Take 1,000 mg by mouth daily.      Marland Kitchen aspirin 81 MG tablet Take 81 mg by mouth daily.      Marland Kitchen BIOTIN 5000 PO Take by mouth daily.       . Calcium Carbonate-Vit D-Min (CALTRATE 600+D PLUS MINERALS PO) Take 1 tablet by mouth daily.      . Cholecalciferol (VITAMIN D3) 10000 UNITS capsule Take 10,000 Units by mouth daily.      . ciprofloxacin (CIPRO) 500 MG tablet Take 1 tablet (500 mg total) by mouth 2 (two) times daily.  20 tablet  1  . Coenzyme Q10 (CO Q 10 PO) Take by mouth daily.      Marland Kitchen dexamethasone (DECADRON) 4 MG tablet Take 2 tablets (8 mg total) by mouth 2 (two) times daily. Start the day before Taxotere. Then again the day after chemo for 3 days.  30 tablet  1  . L-ARGININE PO Take by  mouth daily.      Marland Kitchen lidocaine-prilocaine (EMLA) cream Apply topically as needed.  30 g  6  . lisinopril-hydrochlorothiazide (PRINZIDE,ZESTORETIC) 20-12.5 MG per tablet Take 1 tablet by mouth 2 (two) times daily.      Marland Kitchen LORazepam (ATIVAN) 0.5 MG tablet Take 1 tablet (0.5 mg total) by mouth every 6 (six) hours as needed (Nausea or vomiting).  30 tablet  0  . nystatin (MYCOSTATIN) 100000 UNIT/ML suspension Take 5 mLs (500,000 Units total) by mouth 4 (four) times daily.  60 mL  0  . ondansetron (ZOFRAN) 8 MG tablet Take 1 tablet (8 mg total) by mouth 2 (two) times daily. Start the day after chemo for 3 days. Then take as needed for nausea or vomiting.  30 tablet  1  . OVER THE COUNTER MEDICATION Take 1 tablet by mouth daily. Trace Minerals      . oxyCODONE (OXY IR/ROXICODONE) 5 MG immediate release tablet Take 1 tablet (5 mg total) by mouth every 4 (four) hours as needed for severe pain.  60 tablet  0  .  potassium chloride SA (K-DUR,KLOR-CON) 20 MEQ tablet Take 1 tablet (20 mEq total) by mouth once. X 3 days, then stop  5 tablet  0  . Probiotic Product (TRUBIOTICS) CAPS Take 1 capsule by mouth daily.      . prochlorperazine (COMPAZINE) 10 MG tablet Take 1 tablet (10 mg total) by mouth every 6 (six) hours as needed (Nausea or vomiting).  30 tablet  1  . VITAMIN E PO Take 1 capsule by mouth daily.        No current facility-administered medications for this encounter.    Allergies  Allergen Reactions  . Adhesive [Tape]   . Nickel     History   Social History  . Marital Status: Married    Spouse Name: N/A    Number of Children: 1  . Years of Education: N/A   Occupational History  .      homemaker   Social History Main Topics  . Smoking status: Former Smoker -- 1.00 packs/day for 15 years    Types: Cigars, Cigarettes  . Smokeless tobacco: Never Used     Comment: remote smoking history  . Alcohol Use: 0.0 oz/week    0 Shots of liquor per week     Comment: occ   . Drug Use: Yes     Comment: Marjuana  . Sexual Activity: Yes   Other Topics Concern  . 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 Hypertension Mother   . Dementia Mother     small vessel disease  . Cancer Mother     cervical  cancer  . Cancer Father     prostate ca  . Diabetes Father   . Arthritis Father     PHYSICAL EXAM: Filed Vitals:   10/19/13 1359  BP: 114/82  Pulse: 98  Height: '5\' 4"'  (1.626 m)  Weight: 176 lb 1.9 oz (79.888 kg)  SpO2: 98%    General:  Well appearing. No respiratory difficulty HEENT: normal Neck: supple.  no JVD. Carotids 2+ bilat; no bruits. No lymphadenopathy or thryomegaly appreciated. Cor: PMI  nondisplaced. Regular rate & rhythm. No rubs, gallops or murmurs. Lungs: clear Abdomen: soft, nontender, nondistended. No hepatosplenomegaly. No bruits or masses. Good bowel sounds. Extremities: no cyanosis, clubbing, rash, edema Neuro: alert & oriented x 3, cranial nerves grossly intact. moves all 4 extremities w/o difficulty. Affect pleasant.  ASSESSMENT & PLAN: 1. Breast CA, right 2. HTN - well controlled  Explained incidence of Herceptin cardiotoxicity and role of Cardio-oncology clinic at length. Echo images reviewed personally. All parameters stable. Reviewed signs and symptoms of HF to look for. Continue Herceptin. Follow-up with echo in 3 months.  Benay Spice 2:31 PM

## 2013-10-19 NOTE — Patient Instructions (Signed)
Your physician has requested that you have an echocardiogram. Echocardiography is a painless test that uses sound waves to create images of your heart. It provides your doctor with information about the size and shape of your heart and how well your heart's chambers and valves are working. This procedure takes approximately one hour. There are no restrictions for this procedure.  Your physician recommends that you schedule a follow-up appointment in: 3 MONTHS 

## 2013-10-21 ENCOUNTER — Telehealth: Payer: Self-pay | Admitting: Family Medicine

## 2013-10-21 MED ORDER — LISINOPRIL-HYDROCHLOROTHIAZIDE 20-12.5 MG PO TABS: 1.0000 | ORAL_TABLET | Freq: Every day | ORAL | Status: DC

## 2013-10-21 NOTE — Telephone Encounter (Signed)
Rx sent to pharmacy   

## 2013-10-21 NOTE — Telephone Encounter (Signed)
Pt need new rx lisinopril-hctz 20-12.5mg  #30 w/refills sent to [facility] [address]

## 2013-10-23 ENCOUNTER — Telehealth: Payer: Self-pay | Admitting: Oncology

## 2013-10-23 ENCOUNTER — Encounter: Payer: Self-pay | Admitting: Adult Health

## 2013-10-23 ENCOUNTER — Ambulatory Visit (HOSPITAL_BASED_OUTPATIENT_CLINIC_OR_DEPARTMENT_OTHER): Payer: BC Managed Care – PPO

## 2013-10-23 ENCOUNTER — Other Ambulatory Visit (HOSPITAL_BASED_OUTPATIENT_CLINIC_OR_DEPARTMENT_OTHER): Payer: BC Managed Care – PPO

## 2013-10-23 ENCOUNTER — Encounter: Payer: Self-pay | Admitting: *Deleted

## 2013-10-23 ENCOUNTER — Ambulatory Visit (HOSPITAL_BASED_OUTPATIENT_CLINIC_OR_DEPARTMENT_OTHER): Payer: BC Managed Care – PPO | Admitting: Adult Health

## 2013-10-23 ENCOUNTER — Telehealth: Payer: Self-pay | Admitting: *Deleted

## 2013-10-23 VITALS — BP 136/88 | HR 98 | Temp 98.4°F | Resp 18 | Ht 64.0 in | Wt 174.9 lb

## 2013-10-23 DIAGNOSIS — Z5111 Encounter for antineoplastic chemotherapy: Secondary | ICD-10-CM

## 2013-10-23 DIAGNOSIS — K30 Functional dyspepsia: Secondary | ICD-10-CM

## 2013-10-23 DIAGNOSIS — Z5112 Encounter for antineoplastic immunotherapy: Secondary | ICD-10-CM

## 2013-10-23 DIAGNOSIS — R197 Diarrhea, unspecified: Secondary | ICD-10-CM

## 2013-10-23 DIAGNOSIS — C50519 Malignant neoplasm of lower-outer quadrant of unspecified female breast: Secondary | ICD-10-CM

## 2013-10-23 DIAGNOSIS — E860 Dehydration: Secondary | ICD-10-CM

## 2013-10-23 DIAGNOSIS — Z170 Estrogen receptor positive status [ER+]: Secondary | ICD-10-CM

## 2013-10-23 LAB — COMPREHENSIVE METABOLIC PANEL (CC13)
ALT: 25 U/L (ref 0–55)
AST: 18 U/L (ref 5–34)
Albumin: 4 g/dL (ref 3.5–5.0)
Alkaline Phosphatase: 56 U/L (ref 40–150)
Anion Gap: 14 mEq/L — ABNORMAL HIGH (ref 3–11)
BUN: 13.1 mg/dL (ref 7.0–26.0)
CO2: 18 mEq/L — ABNORMAL LOW (ref 22–29)
Calcium: 10.1 mg/dL (ref 8.4–10.4)
Chloride: 107 mEq/L (ref 98–109)
Creatinine: 0.8 mg/dL (ref 0.6–1.1)
Glucose: 136 mg/dl (ref 70–140)
Potassium: 3.4 mEq/L — ABNORMAL LOW (ref 3.5–5.1)
Sodium: 139 mEq/L (ref 136–145)
Total Bilirubin: 0.79 mg/dL (ref 0.20–1.20)
Total Protein: 8 g/dL (ref 6.4–8.3)

## 2013-10-23 LAB — CBC WITH DIFFERENTIAL/PLATELET
BASO%: 0 % (ref 0.0–2.0)
Basophils Absolute: 0 10*3/uL (ref 0.0–0.1)
EOS%: 0 % (ref 0.0–7.0)
Eosinophils Absolute: 0 10*3/uL (ref 0.0–0.5)
HCT: 33.1 % — ABNORMAL LOW (ref 34.8–46.6)
HGB: 11.4 g/dL — ABNORMAL LOW (ref 11.6–15.9)
LYMPH%: 6.6 % — ABNORMAL LOW (ref 14.0–49.7)
MCH: 31.7 pg (ref 25.1–34.0)
MCHC: 34.4 g/dL (ref 31.5–36.0)
MCV: 91.9 fL (ref 79.5–101.0)
MONO#: 0.2 10*3/uL (ref 0.1–0.9)
MONO%: 2.6 % (ref 0.0–14.0)
NEUT#: 7.9 10*3/uL — ABNORMAL HIGH (ref 1.5–6.5)
NEUT%: 90.8 % — ABNORMAL HIGH (ref 38.4–76.8)
Platelets: 266 10*3/uL (ref 145–400)
RBC: 3.6 10*6/uL — ABNORMAL LOW (ref 3.70–5.45)
RDW: 13.7 % (ref 11.2–14.5)
WBC: 8.7 10*3/uL (ref 3.9–10.3)
lymph#: 0.6 10*3/uL — ABNORMAL LOW (ref 0.9–3.3)
nRBC: 0 % (ref 0–0)

## 2013-10-23 MED ORDER — PALONOSETRON HCL INJECTION 0.25 MG/5ML
INTRAVENOUS | Status: AC
  Filled 2013-10-23: qty 5

## 2013-10-23 MED ORDER — DIPHENHYDRAMINE HCL 25 MG PO CAPS
ORAL_CAPSULE | ORAL | Status: AC
  Filled 2013-10-23: qty 2

## 2013-10-23 MED ORDER — HEPARIN SOD (PORK) LOCK FLUSH 100 UNIT/ML IV SOLN
500.0000 [IU] | Freq: Once | INTRAVENOUS | Status: AC | PRN
  Administered 2013-10-23: 500 [IU]
  Filled 2013-10-23: qty 5

## 2013-10-23 MED ORDER — PERTUZUMAB CHEMO INJECTION 420 MG/14ML
420.0000 mg | Freq: Once | INTRAVENOUS | Status: AC
  Administered 2013-10-23: 420 mg via INTRAVENOUS
  Filled 2013-10-23: qty 14

## 2013-10-23 MED ORDER — DIPHENHYDRAMINE HCL 25 MG PO CAPS
50.0000 mg | ORAL_CAPSULE | Freq: Once | ORAL | Status: AC
  Administered 2013-10-23: 50 mg via ORAL

## 2013-10-23 MED ORDER — LORAZEPAM 0.5 MG PO TABS: 0.5000 mg | ORAL_TABLET | Freq: Four times a day (QID) | ORAL | Status: DC | PRN

## 2013-10-23 MED ORDER — DEXAMETHASONE SODIUM PHOSPHATE 20 MG/5ML IJ SOLN: 20.0000 mg | Freq: Once | INTRAMUSCULAR | Status: DC

## 2013-10-23 MED ORDER — LORAZEPAM 2 MG/ML IJ SOLN
0.5000 mg | Freq: Once | INTRAMUSCULAR | Status: AC
  Administered 2013-10-23: 0.5 mg via INTRAVENOUS

## 2013-10-23 MED ORDER — SODIUM CHLORIDE 0.9 % IV SOLN
Freq: Once | INTRAVENOUS | Status: AC
  Administered 2013-10-23: 13:00:00 via INTRAVENOUS

## 2013-10-23 MED ORDER — ACETAMINOPHEN 325 MG PO TABS
ORAL_TABLET | ORAL | Status: AC
Start: 2013-10-23 — End: 2013-10-23
  Filled 2013-10-23: qty 2

## 2013-10-23 MED ORDER — DEXAMETHASONE SODIUM PHOSPHATE 20 MG/5ML IJ SOLN
INTRAMUSCULAR | Status: AC
  Filled 2013-10-23: qty 5

## 2013-10-23 MED ORDER — SODIUM CHLORIDE 0.9 % IJ SOLN
10.0000 mL | INTRAMUSCULAR | Status: DC | PRN
  Administered 2013-10-23: 10 mL
  Filled 2013-10-23: qty 10

## 2013-10-23 MED ORDER — TRASTUZUMAB CHEMO INJECTION 440 MG
6.0000 mg/kg | Freq: Once | INTRAVENOUS | Status: AC
  Administered 2013-10-23: 462 mg via INTRAVENOUS
  Filled 2013-10-23: qty 22

## 2013-10-23 MED ORDER — DEXAMETHASONE SODIUM PHOSPHATE 20 MG/5ML IJ SOLN
12.0000 mg | Freq: Once | INTRAMUSCULAR | Status: AC
  Administered 2013-10-23: 12 mg via INTRAVENOUS

## 2013-10-23 MED ORDER — OXYCODONE HCL 5 MG PO TABS: 5.0000 mg | ORAL_TABLET | ORAL | Status: DC | PRN

## 2013-10-23 MED ORDER — SODIUM CHLORIDE 0.9 % IV SOLN
150.0000 mg | Freq: Once | INTRAVENOUS | Status: AC
  Administered 2013-10-23: 150 mg via INTRAVENOUS
  Filled 2013-10-23: qty 5

## 2013-10-23 MED ORDER — SODIUM CHLORIDE 0.9 % IV SOLN
75.0000 mg/m2 | Freq: Once | INTRAVENOUS | Status: AC
  Administered 2013-10-23: 140 mg via INTRAVENOUS
  Filled 2013-10-23: qty 14

## 2013-10-23 MED ORDER — LORAZEPAM 2 MG/ML IJ SOLN
INTRAMUSCULAR | Status: AC
  Filled 2013-10-23: qty 1

## 2013-10-23 MED ORDER — SODIUM CHLORIDE 0.9 % IV SOLN
703.8000 mg | Freq: Once | INTRAVENOUS | Status: AC
  Administered 2013-10-23: 700 mg via INTRAVENOUS
  Filled 2013-10-23: qty 70

## 2013-10-23 MED ORDER — PALONOSETRON HCL INJECTION 0.25 MG/5ML
0.2500 mg | Freq: Once | INTRAVENOUS | Status: AC
  Administered 2013-10-23: 0.25 mg via INTRAVENOUS

## 2013-10-23 MED ORDER — OMEPRAZOLE 40 MG PO CPDR: 40.0000 mg | DELAYED_RELEASE_CAPSULE | Freq: Every day | ORAL | Status: DC

## 2013-10-23 MED ORDER — ACETAMINOPHEN 325 MG PO TABS
650.0000 mg | ORAL_TABLET | Freq: Once | ORAL | Status: AC
  Administered 2013-10-23: 650 mg via ORAL

## 2013-10-23 NOTE — Progress Notes (Signed)
[place] Work  Clinical Social Work was referred by patient for assessment of psychosocial needs and assistance with disability application questions.  Clinical Social Worker met with patient at [facility] in the infusion room to go over paperwork that pt brought in and needed help understanding. CSW reviewed paperwork with pt and answered her questions. Pt now aware of how to further complete, CSW witnessed signing of form as well. Pt to work on form and bring back next week to complete further. CSW will see next week to review with pt.  Pt reports to have good family support and is coping well currently.     Clinical Social Work interventions: Disability paperwork assistance Emotional support  Loren Racer, LCSW Clinical Social Worker [facility] Wednesday, Thursday and Friday Phone: [phone] Fax: [phone]

## 2013-10-23 NOTE — Telephone Encounter (Signed)
, °

## 2013-10-23 NOTE — Patient Instructions (Addendum)
Nausea medication  Decadron/Dexamethasone: take two tablets once the day after chemotherapy, then take two tablets twice a day for two days.    Ondansetron/Zofran: take twice a day as needed starting the third day after chemotherapy  Prochlorperazine/Compazine: Take every 6 hours as needed for nausea  Lorazepam/Ativan: Take every 6 hours as needed for nausea  You will receive IV fluids tomorrow.  Take Omeprazole/Prilosec 40mg  first thing in the morning for you indigestion.  Take it every day.   Take Imodium if you need it for diarrhea. Do not take more than 16 mg in a 24 hour period.     Omeprazole tablets (OTC) What is this medicine? OMEPRAZOLE (oh ME pray zol) prevents the production of acid in the stomach. It is used to treat the symptoms of heartburn. You can buy this medicine without a prescription. This product is not for long-term use, unless otherwise directed by your doctor or health care professional. This medicine may be used for other purposes; ask your health care provider or pharmacist if you have questions. COMMON BRAND NAME(S): Prilosec OTC What should I tell my health care provider before I take this medicine? They need to know if you have any of these conditions: -black or bloody stools -chest pain -difficulty swallowing -have had heartburn for over 3 months -have heartburn with dizziness, lightheadedness or sweating -liver disease -stomach pain -unexplained weight loss -vomiting with blood -wheezing -an unusual or allergic reaction to omeprazole, other medicines, foods, dyes, or preservatives -pregnant or trying to get pregnant -breast-feeding How should I use this medicine? Take this medicine by mouth. Follow the directions on the product label. If you are taking this medicine without a prescription, take one tablet every day. Do not use for longer than 14 days or repeat a course of treatment more often than every 4 months unless directed by a doctor or  healthcare professional. Take your dose at regular intervals every 24 hours. Swallow the tablet whole with a drink of water. Do not crush, break or chew. This medicine works best if taken on an empty stomach 30 minutes before breakfast. If you are using this medicine with the prescription of your doctor or healthcare professional, follow the directions you were given. Do not take your medicine more often than directed. Talk to your pediatrician regarding the use of this medicine in children. Special care may be needed. Overdosage: If you think you have taken too much of this medicine contact a poison control center or emergency room at once. NOTE: This medicine is only for you. Do not share this medicine with others. What if I miss a dose? If you miss a dose, take it as soon as you can. If it is almost time for your next dose, take only that dose. Do not take double or extra doses. What may interact with this medicine? Do not take this medicine with any of the following medications: -atazanavir -clopidogrel -nelfinavir This medicine may also interact with the following medications: -ampicillin -certain medicines for anxiety or sleep -certain medicines that treat or prevent blood clots like warfarin -cyclosporine -diazepam -digoxin -disulfiram -iron salts -phenytoin -prescription medicine for fungal or yeast infection like itraconazole, ketoconazole, voriconazole -saquinavir -tacrolimus This list may not describe all possible interactions. Give your health care provider a list of all the medicines, herbs, non-prescription drugs, or dietary supplements you use. Also tell them if you smoke, drink alcohol, or use illegal drugs. Some items may interact with your medicine. What should I watch for  while using this medicine? It can take several days before your heartburn gets better. Check with your doctor or health care professional if your condition does not start to get better, or if it gets  worse. Do not treat diarrhea with over the counter products. Contact your doctor if you have diarrhea that lasts more than 2 days or if it is severe and watery. Do not treat yourself for heartburn with this medicine for more than 14 days in a row. You should only use this medicine for a 2-week treatment period once every 4 months. If your symptoms return shortly after your therapy is complete, or within the 4 month time frame, call your doctor or health care professional. What side effects may I notice from receiving this medicine? Side effects that you should report to your doctor or health care professional as soon as possible: -allergic reactions like skin rash, itching or hives, swelling of the face, lips, or tongue -bone, muscle or joint pain -breathing problems -chest pain or chest tightness -dark yellow or brown urine -diarrhea -dizziness -fast, irregular heartbeat -feeling faint or lightheaded -fever or sore throat -muscle spasm -palpitations -redness, blistering, peeling or loosening of the skin, including inside the mouth -seizures -tremors -unusual bleeding or bruising -unusually weak or tired -yellowing of the eyes or skin Side effects that usually do not require medical attention (Report these to your doctor or health care professional if they continue or are bothersome.): -constipation -dry mouth -headache -loose stools -nausea This list may not describe all possible side effects. Call your doctor for medical advice about side effects. You may report side effects to FDA at 1-800-FDA-1088. Where should I keep my medicine? Keep out of the reach of children. Store at room temperature between 20 and 25 degrees C (68 and 77 degrees F). Protect from light and moisture. Throw away any unused medicine after the expiration date. NOTE: This sheet is a summary. It may not cover all possible information. If you have questions about this medicine, talk to your doctor, pharmacist, or  health care provider.  2014, Elsevier/Gold Standard. (2011-04-16 11:40:25)

## 2013-10-23 NOTE — Progress Notes (Signed)
Upon arrival to infusion room, it was noted that patient did not see MD today. I paged Dr. Humphrey Rolls to sign a premed order and she asked which doctor saw the patient today. I told her pt did not have an appointment with MD. She stated patient needed to see MD or midlevel prior to beginning treatment today. Lisabeth Register, NP agreed to see patient. Patient sent over to see her and then patient came back to infusion for appt. Cindi Carbon, RN

## 2013-10-23 NOTE — Telephone Encounter (Signed)
Per staff message and POF I have scheduled appts.  JMW  

## 2013-10-23 NOTE — Progress Notes (Signed)
Hematology and Oncology Follow Up Visit  Tina [name] [id number] [birth year] 53 y.o. 10/25/2013 8:44 AM     Principle Diagnosis:Tina [name] 53 y.o. female with clinical stage IIA ER positive, HER-2/neu positive invasive ductal carcinoma of the right breast.   Prior Therapy:  1. Underwent screening mammogram. On the right side she was noted to have a mass with microcalcifications. Ultrasound in February 2015 showed a 1.2 x 1.1 x 1.0 cm rounded hypoechoic mass with irregular margins in the 8:00 position 4 cm from the nipple. No abnormal axillary lymph nodes were noted. She had MRI of the breasts performed that showed in the lower outer right breast 2.2 x 1.3 x 1.5 cm Irregular mass. In the upper outer left breast there was a 7 x 5 x 8 cm indeterminate enhancing mass. Also another 9 x 7 x 6 mm indeterminant enhancing mass at 9:00 in the left breast was noted.there are also noted to be ominent cortices of right axillary lymph nodes. Patient has undergone biopsy of these. On the right patient had biopsy performed of the 1.2 cm mass. The pathology revealed grade 3 invasive ductal carcinoma with ductal carcinoma in situ. Tumor was ER +70% PR +6% HER-2/neu was amplified and proliferation marker Ki-67 70%  2. Referred to genetic counseling:   3. Neoadjuvant TCH/Perjeta every 3 weeks x 6 cycles beginning 10/02/13 followed by breast surgery    Current therapy: Taxotere, Carboplatin, Herceptin, Perjeta given every 21 days with Neulasta support  Interim History: Tina [name] 53 y.o. female with stage IIA HER-2/neu positive invasive ductal carcinoma of the right breast here prior to her second cyle of neoadjuvant Taxotere/Carbo, Herceptin and Perjeta.  She is doing well today.  She tells me that she had mucositis that was very severe with her first cycle.  She wants to know if she will get a dose reduction on her chemotherapy.  She was extremely nauseated and had vomiting after chemotherapy.  She became  dehydrated and had diarrhea.  She also experiences significant bone pain after receiving Neulasta that requires Oxycodone.  She is having burning and indigestion the week after chemotherapy as well.  This has since resolved, but she wants to know what she can do to alleviate this problems.  These symptoms have since resolved. Today she denies fevers, chills, nausea, vomiting, constipation, diarrhea, numbness, skin changes, nail changes, or any other concerns.    Medications:  Current Outpatient Prescriptions  Medication Sig Dispense Refill  . ascorbic acid (VITAMIN C) 1000 MG tablet Take 1,000 mg by mouth daily.      Marland Kitchen aspirin 81 MG tablet Take 81 mg by mouth daily.      Marland Kitchen BIOTIN 5000 PO Take by mouth daily.       . Calcium Carbonate-Vit D-Min (CALTRATE 600+D PLUS MINERALS PO) Take 1 tablet by mouth daily.      . Cholecalciferol (VITAMIN D3) 10000 UNITS capsule Take 10,000 Units by mouth daily.      . Coenzyme Q10 (CO Q 10 PO) Take by mouth daily.      Marland Kitchen dexamethasone (DECADRON) 4 MG tablet Take 2 tablets (8 mg total) by mouth 2 (two) times daily. Start the day before Taxotere. Then again the day after chemo for 3 days.  30 tablet  1  . L-ARGININE PO Take by mouth daily.      Marland Kitchen lidocaine-prilocaine (EMLA) cream Apply topically as needed.  30 g  6  . lisinopril-hydrochlorothiazide (PRINZIDE,ZESTORETIC) 20-12.5 MG per tablet Take 1 tablet by  mouth 2 (two) times daily.      Marland Kitchen LORazepam (ATIVAN) 0.5 MG tablet Take 1 tablet (0.5 mg total) by mouth every 6 (six) hours as needed (Nausea or vomiting).  30 tablet  0  . ondansetron (ZOFRAN) 8 MG tablet Take 1 tablet (8 mg total) by mouth 2 (two) times daily. Start the day after chemo for 3 days. Then take as needed for nausea or vomiting.  30 tablet  1  . OVER THE COUNTER MEDICATION Take 1 tablet by mouth daily. Trace Minerals      . oxyCODONE (OXY IR/ROXICODONE) 5 MG immediate release tablet Take 1 tablet (5 mg total) by mouth every 4 (four) hours as needed  for severe pain.  60 tablet  0  . Probiotic Product (TRUBIOTICS) CAPS Take 1 capsule by mouth daily.      . prochlorperazine (COMPAZINE) 10 MG tablet Take 1 tablet (10 mg total) by mouth every 6 (six) hours as needed (Nausea or vomiting).  30 tablet  1  . VITAMIN E PO Take 1 capsule by mouth daily.       . Alum & Mag Hydroxide-Simeth (MAGIC MOUTHWASH) SOLN Take 5 mLs by mouth 4 (four) times daily.  100 mL  2  . ciprofloxacin (CIPRO) 500 MG tablet Take 1 tablet (500 mg total) by mouth 2 (two) times daily.  20 tablet  1  . nystatin (MYCOSTATIN) 100000 UNIT/ML suspension Take 5 mLs (500,000 Units total) by mouth 4 (four) times daily.  60 mL  0  . omeprazole (PRILOSEC) 40 MG capsule Take 1 capsule (40 mg total) by mouth daily.  30 capsule  6  . potassium chloride SA (K-DUR,KLOR-CON) 20 MEQ tablet Take 1 tablet (20 mEq total) by mouth once. X 3 days, then stop  5 tablet  0   No current facility-administered medications for this visit.     Allergies:  Allergies  Allergen Reactions  . Adhesive [Tape]   . Nickel     Medical History: Past Medical History  Diagnosis Date  . Leukocytopenia   . Hypertension   . Degenerative joint disease   . Leukopenia 06/16/2013  . Needs flu shot 06/17/2013  . Cancer of right breast 09/03/13    Invasive Ductal Carcinoma/Ductal Carcinoma Insitu    Surgical History:  Past Surgical History  Procedure Laterality Date  . Cesarean section    . Carpal tunnel release    . Trigger finger release       Review of Systems: A 10 point review of systems was conducted and is otherwise negative except for what is noted above.     Physical Exam: Blood pressure 136/88, pulse 98, temperature 98.4 F (36.9 C), temperature source Oral, resp. rate 18, height '5\' 4"'  (1.626 m), weight 174 lb 14.4 oz (79.334 kg), last menstrual period 06/25/2013. GENERAL: Patient is a well appearing female in no acute distress HEENT:  Sclerae anicteric.  Oropharynx clear and moist. No  ulcerations or evidence of oropharyngeal candidiasis. Neck is supple.  NODES:  No cervical, supraclavicular, or axillary lymphadenopathy palpated.  BREAST EXAM:  Deferred. LUNGS:  Clear to auscultation bilaterally.  No wheezes or rhonchi. HEART:  Regular rate and rhythm. No murmur appreciated. ABDOMEN:  Soft, nontender.  Positive, normoactive bowel sounds. No organomegaly palpated. MSK:  No focal spinal tenderness to palpation. Full range of motion bilaterally in the upper extremities. EXTREMITIES:  No peripheral edema.   SKIN:  Clear with no obvious rashes or skin changes. No nail dyscrasia. NEURO:  Nonfocal. Well oriented.  Appropriate affect. ECOG PERFORMANCE STATUS: 0 - Asymptomatic   Lab Results: Lab Results  Component Value Date   WBC 8.7 10/23/2013   HGB 11.4* 10/23/2013   HCT 33.1* 10/23/2013   MCV 91.9 10/23/2013   PLT 266 10/23/2013     Chemistry      Component Value Date/Time   NA 139 10/23/2013 1113   NA 138 09/25/2013 1320   K 3.4* 10/23/2013 1113   K 3.6* 09/25/2013 1320   CL 98 09/25/2013 1320   CL 104 06/16/2012 1303   CO2 18* 10/23/2013 1113   CO2 27 09/25/2013 1320   BUN 13.1 10/23/2013 1113   BUN 9 09/25/2013 1320   CREATININE 0.8 10/23/2013 1113   CREATININE 0.82 09/25/2013 1320      Component Value Date/Time   CALCIUM 10.1 10/23/2013 1113   CALCIUM 10.0 09/25/2013 1320   ALKPHOS 56 10/23/2013 1113   ALKPHOS 42 10/04/2012 1830   AST 18 10/23/2013 1113   AST 32 10/04/2012 1830   ALT 25 10/23/2013 1113   ALT 27 10/04/2012 1830   BILITOT 0.79 10/23/2013 1113   BILITOT 0.7 10/04/2012 1830       Assessment and Plan: Tina [name] 53 y.o. female with  1.  Clinical stage IIA ER positive, HER-2/neu positive invasive ductal carcinoma of the right breast.  She is currently undergoing neoadjuvant chemotherapy with Taxotere/Carboplatin/Herceptin/Perjeta given on day one of a 21 day cycle with Neulasta support on day 2.  She is doing well today.  Her CBC is stable.  I reviewed it  with her in detail.  She will proceed with cycle 2 of chemotherapy.  We will not dose reduce this cycle.  I explained to her the rationale  2.  Chemotherapy induced nausea and vomiting.  I have changed her premedications prior to treatment to Aloxi, Fosaprepitant, and Dexamethasone.  I also gave her detailed information in her AVS about her post-chemotherapy nausea regimen as she will take Dexamethasone BID for 3 days after treatment and Compazine and Ativan as needed.    3.  Dehydration:  She will receive IV fluids tomorrow when she returns for Neulasta.  She will also likely need it next week when she returns for her nadir check.    4.  Diarrhea:  I recommended she take Imodium if needed for her diarrhea.  5.  Indigestion:  I prescribed Omeprazole 92m daily for her to take.  I gave her detailed information in the AVS about this medication.    6.  Bone pain with Neulasta:  I refilled the patient's Oxycodone as she experiences significant bone pain with her neulasta.  I gave her #60.    7.  Mucositis: [place] needed following her chemotherapy.   The patient will return tomorrow for Neulasta and IV fluids and in one week for labs and an evaluation for chemotoxicities.    I spent 25 minutes counseling the patient face to face.  The total time spent in the appointment was 30 minutes.  LMinette Headland [phone]/29/2015 8:44 AM

## 2013-10-23 NOTE — Patient Instructions (Signed)
[facility] Discharge Instructions for Patients Receiving Chemotherapy  Today you received the following chemotherapy agents Herceptin, Perjeta, Taxotere, Carboplatin.  To help prevent nausea and vomiting after your treatment, we encourage you to take your nausea medication as prescribed.   If you develop nausea and vomiting that is not controlled by your nausea medication, call the clinic.   BELOW ARE SYMPTOMS THAT SHOULD BE REPORTED IMMEDIATELY:  *FEVER GREATER THAN 100.5 F  *CHILLS WITH OR WITHOUT FEVER  NAUSEA AND VOMITING THAT IS NOT CONTROLLED WITH YOUR NAUSEA MEDICATION  *UNUSUAL SHORTNESS OF BREATH  *UNUSUAL BRUISING OR BLEEDING  TENDERNESS IN MOUTH AND THROAT WITH OR WITHOUT PRESENCE OF ULCERS  *URINARY PROBLEMS  *BOWEL PROBLEMS  UNUSUAL RASH Items with * indicate a potential emergency and should be followed up as soon as possible.  Feel free to call the clinic you have any questions or concerns. The clinic phone number is [phone].    

## 2013-10-24 ENCOUNTER — Ambulatory Visit: Payer: BC Managed Care – PPO

## 2013-10-24 ENCOUNTER — Ambulatory Visit (HOSPITAL_BASED_OUTPATIENT_CLINIC_OR_DEPARTMENT_OTHER): Payer: BC Managed Care – PPO

## 2013-10-24 VITALS — BP 135/71 | HR 106 | Temp 98.9°F

## 2013-10-24 DIAGNOSIS — C50519 Malignant neoplasm of lower-outer quadrant of unspecified female breast: Secondary | ICD-10-CM

## 2013-10-24 DIAGNOSIS — Z5189 Encounter for other specified aftercare: Secondary | ICD-10-CM

## 2013-10-24 DIAGNOSIS — E860 Dehydration: Secondary | ICD-10-CM

## 2013-10-24 MED ORDER — PEGFILGRASTIM INJECTION 6 MG/0.6ML
6.0000 mg | Freq: Once | SUBCUTANEOUS | Status: AC
  Administered 2013-10-24: 6 mg via SUBCUTANEOUS

## 2013-10-24 MED ORDER — SODIUM CHLORIDE 0.9 % IV SOLN
INTRAVENOUS | Status: DC
  Administered 2013-10-24: 10:00:00 via INTRAVENOUS

## 2013-10-24 NOTE — Patient Instructions (Signed)
Dehydration, Adult Dehydration is when you lose more fluids from the body than you take in. Vital organs like the kidneys, brain, and heart cannot function without a proper amount of fluids and salt. Any loss of fluids from the body can cause dehydration.  CAUSES   Vomiting.  Diarrhea.  Excessive sweating.  Excessive urine output.  Fever. SYMPTOMS  Mild dehydration  Thirst.  Dry lips.  Slightly dry mouth. Moderate dehydration  Very dry mouth.  Sunken eyes.  Skin does not bounce back quickly when lightly pinched and released.  Dark urine and decreased urine production.  Pegfilgrastim injection What is this medicine? PEGFILGRASTIM (peg fil GRA stim) helps the body make more white blood cells. It is used to prevent infection in people with low amounts of white blood cells following cancer treatment. This medicine may be used for other purposes; ask your health care provider or pharmacist if you have questions. COMMON BRAND NAME(S): Neulasta What should I tell my health care provider before I take this medicine? They need to know if you have any of these conditions: -sickle cell disease -an unusual or allergic reaction to pegfilgrastim, filgrastim, E.coli protein, other medicines, foods, dyes, or preservatives -pregnant or trying to get pregnant -breast-feeding How should I use this medicine? This medicine is for injection under the skin. It is usually given by a health care professional in a hospital or clinic setting. If you get this medicine at home, you will be taught how to prepare and give this medicine. Do not shake this medicine. Use exactly as directed. Take your medicine at regular intervals. Do not take your medicine more often than directed. It is important that you put your used needles and syringes in a special sharps container. Do not put them in a trash can. If you do not have a sharps container, call your pharmacist or healthcare provider to get one. Talk to  your pediatrician regarding the use of this medicine in children. While this drug may be prescribed for children who weigh more than 45 kg for selected conditions, precautions do apply Overdosage: If you think you have taken too much of this medicine contact a poison control center or emergency room at once. NOTE: This medicine is only for you. Do not share this medicine with others. What if I miss a dose? If you miss a dose, take it as soon as you can. If it is almost time for your next dose, take only that dose. Do not take double or extra doses. What may interact with this medicine? -lithium -medicines for growth therapy This list may not describe all possible interactions. Give your health care provider a list of all the medicines, herbs, non-prescription drugs, or dietary supplements you use. Also tell them if you smoke, drink alcohol, or use illegal drugs. Some items may interact with your medicine. What should I watch for while using this medicine? Visit your doctor for regular check ups. You will need important blood work done while you are taking this medicine. What side effects may I notice from receiving this medicine? Side effects that you should report to your doctor or health care professional as soon as possible: -allergic reactions like skin rash, itching or hives, swelling of the face, lips, or tongue -breathing problems -fever -pain, redness, or swelling where injected -shoulder pain -stomach or side pain Side effects that usually do not require medical attention (report to your doctor or health care professional if they continue or are bothersome): -aches, pains -headache -  loss of appetite -nausea, vomiting -unusually tired This list may not describe all possible side effects. Call your doctor for medical advice about side effects. You may report side effects to FDA at 1-800-FDA-1088. Where should I keep my medicine? Keep out of the reach of children. Store in a  refrigerator between 2 and 8 degrees C (36 and 46 degrees F). Do not freeze. Keep in carton to protect from light. Throw away this medicine if it is left out of the refrigerator for more than 48 hours. Throw away any unused medicine after the expiration date. NOTE: This sheet is a summary. It may not cover all possible information. If you have questions about this medicine, talk to your doctor, pharmacist, or health care provider.  2014, Elsevier/Gold Standard. (2008-02-16 15:41:44)

## 2013-10-26 ENCOUNTER — Telehealth: Payer: Self-pay | Admitting: *Deleted

## 2013-10-26 NOTE — Telephone Encounter (Signed)
Message copied by Harmon Pier on Mon Oct 26, 2013  9:31 AM ------      Message from: Minette Headland      Created: Sun Oct 25, 2013  2:12 PM       Please call patient and inform her that her potassium is slightly low and to eat high potassium foods.             Thanks, LC            ----- Message -----         From: [facility]         Sent: 10/23/2013  11:24 AM           To: [name], MD                   ------

## 2013-10-26 NOTE — Telephone Encounter (Signed)
Called pt to inform her of Potassium level. Pt was not home. No answer, but left a detailed message about K+ level(3.4 L).Advised pt to increase high potassium foods and gave some suggestions. Also I told her if she has any questions or concerns to call this LPN back @ [phone], Message to be forwarded to [place].

## 2013-10-27 ENCOUNTER — Telehealth: Payer: Self-pay | Admitting: *Deleted

## 2013-10-27 NOTE — Progress Notes (Signed)
Hematology and Oncology Follow Up Visit  Tina [name] [id number] 1960/09/29 53 y.o. 10/29/2013 10:41 PM     Principle Diagnosis:Tina [name] 53 y.o. female with clinical stage IIA ER positive, HER-2/neu positive invasive ductal carcinoma of the right breast.   Prior Therapy:  1. Underwent screening mammogram. On the right side she was noted to have a mass with microcalcifications. Ultrasound in February 2015 showed a 1.2 x 1.1 x 1.0 cm rounded hypoechoic mass with irregular margins in the 8:00 position 4 cm from the nipple. No abnormal axillary lymph nodes were noted. She had MRI of the breasts performed that showed in the lower outer right breast 2.2 x 1.3 x 1.5 cm Irregular mass. In the upper outer left breast there was a 7 x 5 x 8 cm indeterminate enhancing mass. Also another 9 x 7 x 6 mm indeterminant enhancing mass at 9:00 in the left breast was noted.there are also noted to be ominent cortices of right axillary lymph nodes. Patient has undergone biopsy of these. On the right patient had biopsy performed of the 1.2 cm mass. The pathology revealed grade 3 invasive ductal carcinoma with ductal carcinoma in situ. Tumor was ER +70% PR +6% HER-2/neu was amplified and proliferation marker Ki-67 70%  2. Referred to genetic counseling: upcoming appointment is scheduled.  3. Neoadjuvant TCH/Perjeta every 3 weeks x 6 cycles beginning 10/02/13 followed by breast surgery    Current therapy: Taxotere, Carboplatin, Herceptin, Perjeta given every 21 days with Neulasta support.  She is currently cycle 2 day 8.  Interim History: Tina [name] 53 y.o. female is here for f/u after receiving cycle 2 of Taxotere, Carboplatin, Herceptin, Perjeta.  She is not feeling well today.  She has been experiencing nausea and vomiting for 2 days, decreased appetite, and decreased fluid intake as well.  She is drinking coconut water, and orange juice, however felt tenderness in her mouth after drinking it.  Her  indigestion is improved with taking Omeprazole 57m daily.  Her bone pain following the neulasta is improved from prior.  She still had pain in her lower back and in her upper legs.  The pain is relieved with Oxycodone.  She has had mild constipation, and her stools were dark in color.  She has mild hyperpigmentation at the base of her finger nails.  She did have a menstrual cycle 2 weeks ago and began having vaginal spotting today.  She is having cramping with this.  She denies fevers, chills, diarrhea, numbness, skin changes, or any further concerns.  Otherwise, a 10 point ROS is negative.   Medications:  Current Outpatient Prescriptions  Medication Sig Dispense Refill  . BIOTIN 5000 PO Take by mouth daily.       .Marland Kitchenlidocaine-prilocaine (EMLA) cream Apply topically as needed.  30 g  6  . lisinopril-hydrochlorothiazide (PRINZIDE,ZESTORETIC) 20-12.5 MG per tablet Take 1 tablet by mouth 2 (two) times daily.      .Marland Kitchenomeprazole (PRILOSEC) 40 MG capsule Take 1 capsule (40 mg total) by mouth daily.  30 capsule  6  . ondansetron (ZOFRAN) 8 MG tablet Take 1 tablet (8 mg total) by mouth 2 (two) times daily. Start the day after chemo for 3 days. Then take as needed for nausea or vomiting.  30 tablet  1  . oxyCODONE (OXY IR/ROXICODONE) 5 MG immediate release tablet Take 1 tablet (5 mg total) by mouth every 4 (four) hours as needed for severe pain.  60 tablet  0  . Alum & Mag  Hydroxide-Simeth (MAGIC MOUTHWASH) SOLN Take 5 mLs by mouth 4 (four) times daily.  100 mL  2  . ascorbic acid (VITAMIN C) 1000 MG tablet Take 1,000 mg by mouth daily.      Marland Kitchen aspirin 81 MG tablet Take 81 mg by mouth daily.      . Calcium Carbonate-Vit D-Min (CALTRATE 600+D PLUS MINERALS PO) Take 1 tablet by mouth daily.      . Cholecalciferol (VITAMIN D3) 10000 UNITS capsule Take 10,000 Units by mouth daily.      . Coenzyme Q10 (CO Q 10 PO) Take by mouth daily.      Marland Kitchen dexamethasone (DECADRON) 4 MG tablet Take 2 tablets (8 mg total) by mouth  2 (two) times daily. Start the day before Taxotere. Then again the day after chemo for 3 days.  30 tablet  1  . L-ARGININE PO Take by mouth daily.      Marland Kitchen LORazepam (ATIVAN) 0.5 MG tablet Take 1 tablet (0.5 mg total) by mouth every 6 (six) hours as needed (Nausea or vomiting).  30 tablet  0  . nystatin (MYCOSTATIN) 100000 UNIT/ML suspension Take 5 mLs (500,000 Units total) by mouth 4 (four) times daily.  60 mL  0  . OVER THE COUNTER MEDICATION Take 1 tablet by mouth daily. Trace Minerals      . potassium chloride SA (K-DUR,KLOR-CON) 20 MEQ tablet Take 1 tablet (20 mEq total) by mouth once. X 3 days, then stop  5 tablet  0  . Probiotic Product (TRUBIOTICS) CAPS Take 1 capsule by mouth daily.      . prochlorperazine (COMPAZINE) 10 MG tablet Take 1 tablet (10 mg total) by mouth every 6 (six) hours as needed (Nausea or vomiting).  30 tablet  1  . VITAMIN E PO Take 1 capsule by mouth daily.        No current facility-administered medications for this visit.     Allergies:  Allergies  Allergen Reactions  . Adhesive [Tape]   . Nickel     Medical History: Past Medical History  Diagnosis Date  . Leukocytopenia   . Hypertension   . Degenerative joint disease   . Leukopenia 06/16/2013  . Needs flu shot 06/17/2013  . Cancer of right breast 09/03/13    Invasive Ductal Carcinoma/Ductal Carcinoma Insitu    Surgical History:  Past Surgical History  Procedure Laterality Date  . Cesarean section    . Carpal tunnel release    . Trigger finger release       Review of Systems: A 10 point review of systems was conducted and is otherwise negative except for what is noted above.     Physical Exam: Blood pressure 123/86, pulse 99, temperature 98.9 F (37.2 C), temperature source Oral, resp. rate 18, height '5\' 4"'  (1.626 m), weight 174 lb 3.2 oz (79.017 kg). GENERAL: Patient is a well appearing female in no acute distress HEENT:  Sclerae anicteric.  Oropharynx clear and moist. No ulcerations or  evidence of oropharyngeal candidiasis. Neck is supple.  NODES:  No cervical, supraclavicular, or axillary lymphadenopathy palpated.  BREAST EXAM:  Deferred. LUNGS:  Clear to auscultation bilaterally.  No wheezes or rhonchi. HEART:  Regular rate and rhythm. No murmur appreciated. ABDOMEN:  Soft, nontender.  Positive, normoactive bowel sounds. No organomegaly palpated. MSK:  No focal spinal tenderness to palpation. Full range of motion bilaterally in the upper extremities. EXTREMITIES:  No peripheral edema.   SKIN:  Clear with no obvious rashes or skin changes.fingernails  are beginning to become hyperpigmented at the bases. NEURO:  Nonfocal. Well oriented.  Appropriate affect. ECOG PERFORMANCE STATUS: 1   Lab Results: Lab Results  Component Value Date   WBC 4.0 10/28/2013   HGB 12.1 10/28/2013   HCT 33.7* 10/28/2013   MCV 89.4 10/28/2013   PLT 98* 10/28/2013     Chemistry      Component Value Date/Time   NA 137 10/28/2013 1015   NA 138 09/25/2013 1320   K 3.0* 10/28/2013 1015   K 3.6* 09/25/2013 1320   CL 98 09/25/2013 1320   CL 104 06/16/2012 1303   CO2 27 10/28/2013 1015   CO2 27 09/25/2013 1320   BUN 20.2 10/28/2013 1015   BUN 9 09/25/2013 1320   CREATININE 0.8 10/28/2013 1015   CREATININE 0.82 09/25/2013 1320      Component Value Date/Time   CALCIUM 8.7 10/28/2013 1015   CALCIUM 10.0 09/25/2013 1320   ALKPHOS 64 10/28/2013 1015   ALKPHOS 42 10/04/2012 1830   AST 14 10/28/2013 1015   AST 32 10/04/2012 1830   ALT 16 10/28/2013 1015   ALT 27 10/04/2012 1830   BILITOT 1.23* 10/28/2013 1015   BILITOT 0.7 10/04/2012 1830       Assessment and Plan: Tina [name] 54 y.o. female with  1.  Clinical stage IIA ER positive, HER-2/neu positive invasive ductal carcinoma of the right breast.  Patient is currently undergoing neoadjuvant chemotherapy with Taxotere, Carboplatin, Herceptin and Perjeta given once every 21 days, with Neulasta given on day 2.  She is tolerating this therapy moderately well.  She is cycle 2  day 8 of treatment.  Her CBC is mostly stable, she has mild thrombocytopenia.    2.  Chemotherapy induced nausea and vomiting.   The patient has been experiencing intermittent nausea/vomiting for the past two days.  We reviewed her anti-emetics.  She will take Zofran 2m three times per day with Compazine in between the times that she takes the Zofran.  She will continue to take Lorazepam every evening.  She will receive IV anti-emetics in the infusion room today.    3.  Dehydration:  Patient is dehydrated due to decreased PO intake and nausea/vomiting.  She will receive 1L of IV fluids today in the infusion room.    4.  Dark stools/constipation:  Her constipation is better, however her stools are dark.  I gave her stool cards today to evaluate for any occult blood.    5.  Indigestion:  This is improved with Omeprazole daily.    6.  Bone pain with Neulasta: This is improved from her last cycle.  She will continue to take Oxycodone if needed for this.    7.  Mucositis: Patient has magic mouthwash PRN.  I counseled her on avoiding acidic and spicy foods.    8.  Menstruation:  I discussed Zoladex with the patient today.  She would like to start this therapy to suppress her ovaries.  I gave her detailed information in her AVS about it and I have requested prior authorization for this.  She will start on 11/13/13.  9. Hypokalemia:  The patient's potassium level is 3.0 today.  I reviewed this with her.  She will receive her IV Zofran and then take Kdur 457m PO in the treatment room.  If this doesn't cause increased nausea, I will prescribe Kdur daily for her.  I also gave her information on high potassium foods to eat.    The patient will  return tomorrow and Friday for IV fluids.  She will return on 11/13/13 for labs, evaluation and cycle 3 day 1 of Taxotere/Carboplatin/Herceptin/Perjeta.  She knows to call us in the interim if she has any questions or concerns and we can certainly see her sooner if  necessary.    I spent 25 minutes counseling the patient face to face.  The total time spent in the appointment was 30 minutes.  Minette Headland, [place] [phone] 10/29/2013 10:41 PM

## 2013-10-27 NOTE — Telephone Encounter (Signed)
Left message for to return my call so I can reschedule her genetic appt.

## 2013-10-28 ENCOUNTER — Telehealth: Payer: Self-pay | Admitting: *Deleted

## 2013-10-28 ENCOUNTER — Telehealth: Payer: Self-pay

## 2013-10-28 ENCOUNTER — Ambulatory Visit (HOSPITAL_BASED_OUTPATIENT_CLINIC_OR_DEPARTMENT_OTHER): Payer: BC Managed Care – PPO

## 2013-10-28 ENCOUNTER — Other Ambulatory Visit: Payer: Self-pay | Admitting: *Deleted

## 2013-10-28 ENCOUNTER — Other Ambulatory Visit (HOSPITAL_BASED_OUTPATIENT_CLINIC_OR_DEPARTMENT_OTHER): Payer: BC Managed Care – PPO

## 2013-10-28 ENCOUNTER — Ambulatory Visit (HOSPITAL_BASED_OUTPATIENT_CLINIC_OR_DEPARTMENT_OTHER): Payer: BC Managed Care – PPO | Admitting: Adult Health

## 2013-10-28 ENCOUNTER — Telehealth: Payer: Self-pay | Admitting: Adult Health

## 2013-10-28 VITALS — BP 123/86 | HR 99 | Temp 98.9°F | Resp 18 | Ht 64.0 in | Wt 174.2 lb

## 2013-10-28 DIAGNOSIS — C50519 Malignant neoplasm of lower-outer quadrant of unspecified female breast: Secondary | ICD-10-CM

## 2013-10-28 DIAGNOSIS — R112 Nausea with vomiting, unspecified: Secondary | ICD-10-CM

## 2013-10-28 DIAGNOSIS — E860 Dehydration: Secondary | ICD-10-CM

## 2013-10-28 DIAGNOSIS — K121 Other forms of stomatitis: Secondary | ICD-10-CM

## 2013-10-28 DIAGNOSIS — Z170 Estrogen receptor positive status [ER+]: Secondary | ICD-10-CM

## 2013-10-28 DIAGNOSIS — K1230 Oral mucositis (ulcerative), unspecified: Secondary | ICD-10-CM

## 2013-10-28 DIAGNOSIS — E876 Hypokalemia: Secondary | ICD-10-CM

## 2013-10-28 LAB — CBC WITH DIFFERENTIAL/PLATELET
BASO%: 0.2 % (ref 0.0–2.0)
Basophils Absolute: 0 10*3/uL (ref 0.0–0.1)
EOS%: 1.7 % (ref 0.0–7.0)
Eosinophils Absolute: 0.1 10*3/uL (ref 0.0–0.5)
HCT: 33.7 % — ABNORMAL LOW (ref 34.8–46.6)
HGB: 12.1 g/dL (ref 11.6–15.9)
LYMPH%: 33.4 % (ref 14.0–49.7)
MCH: 32.1 pg (ref 25.1–34.0)
MCHC: 35.9 g/dL (ref 31.5–36.0)
MCV: 89.4 fL (ref 79.5–101.0)
MONO#: 0.1 10*3/uL (ref 0.1–0.9)
MONO%: 2.5 % (ref 0.0–14.0)
NEUT%: 62.2 % (ref 38.4–76.8)
NEUTROS ABS: 2.5 10*3/uL (ref 1.5–6.5)
NRBC: 0 % (ref 0–0)
Platelets: 98 10*3/uL — ABNORMAL LOW (ref 145–400)
RBC: 3.77 10*6/uL (ref 3.70–5.45)
RDW: 13.5 % (ref 11.2–14.5)
WBC: 4 10*3/uL (ref 3.9–10.3)
lymph#: 1.3 10*3/uL (ref 0.9–3.3)

## 2013-10-28 LAB — COMPREHENSIVE METABOLIC PANEL (CC13)
ALK PHOS: 64 U/L (ref 40–150)
ALT: 16 U/L (ref 0–55)
AST: 14 U/L (ref 5–34)
Albumin: 3.4 g/dL — ABNORMAL LOW (ref 3.5–5.0)
Anion Gap: 9 mEq/L (ref 3–11)
BILIRUBIN TOTAL: 1.23 mg/dL — AB (ref 0.20–1.20)
BUN: 20.2 mg/dL (ref 7.0–26.0)
CO2: 27 mEq/L (ref 22–29)
Calcium: 8.7 mg/dL (ref 8.4–10.4)
Chloride: 100 mEq/L (ref 98–109)
Creatinine: 0.8 mg/dL (ref 0.6–1.1)
Glucose: 162 mg/dl — ABNORMAL HIGH (ref 70–140)
Potassium: 3 mEq/L — CL (ref 3.5–5.1)
SODIUM: 137 meq/L (ref 136–145)
TOTAL PROTEIN: 6.6 g/dL (ref 6.4–8.3)

## 2013-10-28 MED ORDER — POTASSIUM CHLORIDE CRYS ER 20 MEQ PO TBCR
40.0000 meq | EXTENDED_RELEASE_TABLET | Freq: Once | ORAL | Status: AC
  Administered 2013-10-28: 40 meq via ORAL
  Filled 2013-10-28: qty 2

## 2013-10-28 MED ORDER — SODIUM CHLORIDE 0.9 % IV SOLN
Freq: Once | INTRAVENOUS | Status: AC
  Administered 2013-10-28: 12:00:00 via INTRAVENOUS

## 2013-10-28 MED ORDER — ONDANSETRON 16 MG/50ML IVPB (CHCC)
INTRAVENOUS | Status: AC
  Filled 2013-10-28: qty 16

## 2013-10-28 MED ORDER — ONDANSETRON 16 MG/50ML IVPB (CHCC)
16.0000 mg | Freq: Once | INTRAVENOUS | Status: AC
  Administered 2013-10-28: 16 mg via INTRAVENOUS

## 2013-10-28 NOTE — Patient Instructions (Signed)
Potassium Content of Foods Potassium is a mineral found in many foods and drinks. It helps keep fluids and minerals balanced in your body and also affects how steadily your heart beats. The body needs potassium to control blood pressure and to keep the muscles and nervous system healthy. However, certain health conditions and medicine may require you to eat more or less potassium-rich foods and drinks. Your caregiver or dietitian will tell you how much potassium you should have each day. COMMON SERVING SIZES The list below tells you how big or small common portion sizes are:  1 oz.........4 stacked dice.  3 oz.........Deck of cards.  1 tsp........Tip of little finger.  1 tbsp......Thumb.  2 tbsp......Golf ball.   c...........Half of a fist.  1 c............A fist. FOODS AND DRINKS HIGH IN POTASSIUM More than 200 mg of potassium per serving. A serving size is  c (120 mL or noted gram weight) unless otherwise stated. While all the items on this list are high in potassium, some items are higher in potassium than others. Fruits  Apricots (sliced), 83 g.  Apricots (dried halves), 3 oz / 24 g.  Avocado (cubed),  c / 50 g.  Banana (sliced), 75 g.  Cantaloupe (cubed), 80 g.  Dates (pitted), 5 whole / 35 g.  Figs (dried), 4 whole / 32 g.  Guava, c / 55 g.  Honeydew, 1 wedge / 85 g.  Kiwi (sliced), 90 g.  Nectarine, 1 small / 129 g.  Orange, 1 medium / 131 g.  Orange juice.  Pomegranate seeds, 87 g.  Pomegranate juice.  Prunes (pitted), 3 whole / 30 g.  Prune juice, 3 oz / 90 mL.  Seedless raisins, 3 tbsp / 27 g. Vegetables  Artichoke,  of a medium / 64 g.  Asparagus (boiled), 90 g.  Baked beans,  c / 63 g.  Bamboo shoots,  c / 38 g.  Beets (cooked slices), 85 g.  Broccoli (boiled), 78 g.  Brussels sprout (boiled), 78 g.  Butternut squash (baked), 103 g.  Chickpea (cooked), 82 g.  Green peas (cooked), 80 g.  Hubbard squash (baked cubes),  c /  68 g.  Kidney beans (cooked), 5 tbsp / 55 g.  Lima beans (cooked),  c / 43 g.  Navy beans (cooked),  c / 61 g.  Potato (baked), 61 g.  Potato (boiled), 78 g.  Pumpkin (boiled), 123 g.  Refried beans,  c / 79 g.  Spinach (cooked),  c / 45 g.  Split peas (cooked),  c / 65 g.  Sun-dried tomatoes, 2 tbsp / 7 g.  Sweet potato (baked),  c / 50 g.  Tomato (chopped or sliced), 90 g.  Tomato juice.  Tomato paste, 4 tsp / 21 g.  Tomato sauce,  c / 61 g.  Vegetable juice.  White mushrooms (cooked), 78 g.  Yam (cooked or baked),  c / 34 g.  Zucchini squash (boiled), 90 g. Other Foods and Drinks  Almonds (whole),  c / 36 g.  Cashews (oil roasted),  c / 32 g.  Chocolate milk.  Chocolate pudding, 142 g.  Clams (steamed), 1.5 oz / 43 g.  Dark chocolate, 1.5 oz / 42 g.  Fish, 3 oz / 85 g.  King crab (steamed), 3 oz / 85 g.  Lobster (steamed), 4 oz / 113 g.  Milk (skim, 1%, 2%, whole), 1 c / 240 mL.  Milk chocolate, 2.3 oz / 66 g.  Milk shake.  Nonfat fruit   variety yogurt, 123 g.  Peanuts (oil roasted), 1 oz / 28 g.  Peanut butter, 2 tbsp / 32 g.  Pistachio nuts, 1 oz / 28 g.  Pumpkin seeds, 1 oz / 28 g.  Red meat (broiled, cooked, grilled), 3 oz / 85 g.  Scallops (steamed), 3 oz / 85 g.  Shredded wheat cereal (dry), 3 oblong biscuits / 75 g.  Spaghetti sauce,  c / 66 g.  Sunflower seeds (dry roasted), 1 oz / 28 g.  Veggie burger, 1 patty / 70 g. FOODS MODERATE IN POTASSIUM Between 150 mg and 200 mg per serving. A serving is  c (120 mL or noted gram weight) unless otherwise stated. Fruits  Grapefruit,  of the fruit / 123 g.  Grapefruit juice.  Pineapple juice.  Plums (sliced), 83 g.  Tangerine, 1 large / 120 g. Vegetables  Carrots (boiled), 78 g.  Carrots (sliced), 61 g.  Rhubarb (cooked with sugar), 120 g.  R[place]abaga (cooked), 120 g.  Sweet corn (cooked), 75 g.  Yellow snap beans (cooked), 63 g. Other Foods and  Drinks   Bagel, 1 bagel / 98 g.  Chicken breast (roasted and chopped),  c / 70 g.  Chocolate ice cream / 66 g.  Pita bread, 1 large / 64 g.  Shrimp (steamed), 4 oz / 113 g.  Swiss cheese (diced), 70 g.  Vanilla ice cream, 66 g.  Vanilla pudding, 140 g. FOODS LOW IN POTASSIUM Less than 150 mg per serving. A serving size is  cup (120 mL or noted gram weight) unless otherwise stated. If you eat more than 1 serving of a food low in potassium, the food may be considered a food high in potassium. Fruits  Apple (slices), 55 g.  Apple juice.  Applesauce, 122 g.  Blackberries, 72 g.  Blueberries, 74 g.  Cranberries, 50 g.  Cranberry juice.  Fruit cocktail, 119 g.  Fruit punch.  Grapes, 46 g.  Grape juice.  Mandarin oranges (canned), 126 g.  Peach (slices), 77 g.  Pineapple (chunks), 83 g.  Raspberries, 62 g.  Red cherries (without pits), 78 g.  Strawberries (sliced), 83 g.  Watermelon (diced), 76 g. Vegetables  Alfalfa sprouts, 17 g.  Bell peppers (sliced), 46 g.  Cabbage (shredded), 35 g.  Cauliflower (boiled), 62 g.  Celery, 51 g.  Collard greens (boiled), 95 g.  Cucumber (sliced), 52 g.  Eggplant (cubed), 41 g.  Green beans (boiled), 63 g.  Lettuce (shredded), 1 c / 36 g.  Onions (sauteed), 44 g.  Radishes (sliced), 58 g.  Spaghetti squash, 51 g. Other Foods and Drinks  Angel food cake, 1 slice / 28 g.  Black tea.  Brown rice (cooked), 98 g.  Butter croissant, 1 medium / 57 g.  Carbonated soda.  Coffee.  Cheddar cheese (diced), 66 g.  Corn flake cereal (dry), 14 g.  Cottage cheese, 118 g.  Cream of rice cereal (cooked), 122 g.  Cream of wheat cereal (cooked), 126 g.  Crisped rice cereal (dry), 14 g.  Egg (boiled, fried, poached, omelet, scrambled), 1 large / 46 61 g.  English muffin, 1 muffin / 57 g.  Frozen ice pop, 1 pop / 55 g.  Graham cracker, 1 large rectangular cracker / 14 g.  Jelly beans, 112  g.  Non-dairy whipped topping.  Oatmeal, 88 g.  Orange sherbet, 74 g.  Puffed rice cereal (dry), 7 g.  Pasta (cooked), 70 g.  Rice cakes, 4 cakes / 36   g.  Sugared doughnut, 4 oz / 116 g.  White bread, 1 slice / 30 g.  White rice (cooked), 79 93 g.  Wild rice (cooked), 82 g.  Yellow cake, 1 slice / 68 g. Document Released: 02/27/2005 Document Revised: 07/02/2012 Document Reviewed: 11/30/2011 [facility] Patient Information 2014 [place].     Goserelin injection What is this medicine? GOSERELIN (GOE se rel in) is similar to a hormone found in the body. It lowers the amount of sex hormones that the body makes. Men will have lower testosterone levels and women will have lower estrogen levels while taking this medicine. In men, this medicine is used to treat prostate cancer; the injection is either given once per month or once every 12 weeks. A once per month injection (only) is used to treat women with endometriosis, dysfunctional uterine bleeding, or advanced breast cancer. This medicine may be used for other purposes; ask your health care provider or pharmacist if you have questions. COMMON BRAND NAME(S): Zoladex What should I tell my health care provider before I take this medicine? They need to know if you have any of these conditions (some only apply to women): -diabetes -heart disease or previous heart attack -high blood pressure -high cholesterol -kidney disease -osteoporosis or low bone density -problems passing urine -spinal cord injury -stroke -tobacco smoker -an unusual or allergic reaction to goserelin, hormone therapy, other medicines, foods, dyes, or preservatives -pregnant or trying to get pregnant -breast-feeding How should I use this medicine? This medicine is for injection under the skin. It is given by a health care professional in a hospital or clinic setting. Men receive this injection once every 4 weeks or once every 12 weeks. Women will only  receive the once every 4 weeks injection. Talk to your pediatrician regarding the use of this medicine in children. Special care may be needed. Overdosage: If you think you have taken too much of this medicine contact a poison control center or emergency room at once. NOTE: This medicine is only for you. Do not share this medicine with others. What if I miss a dose? It is important not to miss your dose. Call your doctor or health care professional if you are unable to keep an appointment. What may interact with this medicine? -female hormones like estrogen -herbal or dietary supplements like black cohosh, chasteberry, or DHEA -female hormones like testosterone -prasterone This list may not describe all possible interactions. Give your health care provider a list of all the medicines, herbs, non-prescription drugs, or dietary supplements you use. Also tell them if you smoke, drink alcohol, or use illegal drugs. Some items may interact with your medicine. What should I watch for while using this medicine? Visit your doctor or health care professional for regular checks on your progress. Your symptoms may appear to get worse during the first weeks of this therapy. Tell your doctor or healthcare professional if your symptoms do not start to get better or if they get worse after this time. Your bones may get weaker if you take this medicine for a long time. If you smoke or frequently drink alcohol you may increase your risk of bone loss. A family history of osteoporosis, chronic use of drugs for seizures (convulsions), or corticosteroids can also increase your risk of bone loss. Talk to your doctor about how to keep your bones strong. This medicine should stop regular monthly menstration in women. Tell your doctor if you continue to [facility]. Women should not become pregnant while taking  this medicine or for 12 weeks after stopping this medicine. Women should inform their doctor if they wish to become  pregnant or think they might be pregnant. There is a potential for serious side effects to an unborn child. Talk to your health care professional or pharmacist for more information. Do not breast-feed an infant while taking this medicine. Men should inform their doctors if they wish to father a child. This medicine may lower sperm counts. Talk to your health care professional or pharmacist for more information. What side effects may I notice from receiving this medicine? Side effects that you should report to your doctor or health care professional as soon as possible: -allergic reactions like skin rash, itching or hives, swelling of the face, lips, or tongue -bone pain -breathing problems -changes in vision -chest pain -feeling faint or lightheaded, falls -fever, chills -pain, swelling, warmth in the leg -pain, tingling, numbness in the hands or feet -swelling of the ankles, feet, hands -trouble passing urine or change in the amount of urine -unusually high or low blood pressure -unusually weak or tired Side effects that usually do not require medical attention (report to your doctor or health care professional if they continue or are bothersome): -change in sex drive or performance -changes in breast size in both males and females -changes in emotions or moods -headache -hot flashes -irritation at site where injected -loss of appetite -skin problems like acne, dry skin -vaginal dryness This list may not describe all possible side effects. Call your doctor for medical advice about side effects. You may report side effects to FDA at 1-800-FDA-1088. Where should I keep my medicine? This drug is given in a hospital or clinic and will not be stored at home. NOTE: This sheet is a summary. It may not cover all possible information. If you have questions about this medicine, talk to your doctor, pharmacist, or health care provider.  2014, [employer]. (2008-11-30 13:28:29)

## 2013-10-28 NOTE — Telephone Encounter (Signed)
, °

## 2013-10-28 NOTE — Telephone Encounter (Signed)
Per staff message and POF I have scheduled appts.  JMW  

## 2013-10-28 NOTE — Patient Instructions (Signed)

## 2013-10-28 NOTE — Telephone Encounter (Signed)
Panic results rcvd from lab - potassium 3.0.  Provided to [facility].

## 2013-10-29 ENCOUNTER — Ambulatory Visit (HOSPITAL_BASED_OUTPATIENT_CLINIC_OR_DEPARTMENT_OTHER): Payer: BC Managed Care – PPO

## 2013-10-29 ENCOUNTER — Other Ambulatory Visit: Payer: Self-pay

## 2013-10-29 ENCOUNTER — Telehealth: Payer: Self-pay | Admitting: *Deleted

## 2013-10-29 ENCOUNTER — Encounter: Payer: Self-pay | Admitting: Adult Health

## 2013-10-29 VITALS — BP 127/71 | HR 95 | Temp 98.5°F | Resp 18

## 2013-10-29 DIAGNOSIS — C50519 Malignant neoplasm of lower-outer quadrant of unspecified female breast: Secondary | ICD-10-CM

## 2013-10-29 DIAGNOSIS — E860 Dehydration: Secondary | ICD-10-CM

## 2013-10-29 MED ORDER — HEPARIN SOD (PORK) LOCK FLUSH 100 UNIT/ML IV SOLN
500.0000 [IU] | Freq: Once | INTRAVENOUS | Status: AC
  Administered 2013-10-29: 500 [IU] via INTRAVENOUS
  Filled 2013-10-29: qty 5

## 2013-10-29 MED ORDER — SODIUM CHLORIDE 0.9 % IJ SOLN
10.0000 mL | INTRAMUSCULAR | Status: DC | PRN
  Administered 2013-10-29: 10 mL via INTRAVENOUS
  Filled 2013-10-29: qty 10

## 2013-10-29 MED ORDER — SODIUM CHLORIDE 0.9 % IV SOLN
Freq: Once | INTRAVENOUS | Status: AC
  Administered 2013-10-29: 14:00:00 via INTRAVENOUS

## 2013-10-29 MED ORDER — ONDANSETRON 8 MG/50ML IVPB (CHCC)
8.0000 mg | Freq: Once | INTRAVENOUS | Status: AC
  Administered 2013-10-29: 8 mg via INTRAVENOUS

## 2013-10-29 MED ORDER — POTASSIUM CHLORIDE CRYS ER 20 MEQ PO TBCR
40.0000 meq | EXTENDED_RELEASE_TABLET | Freq: Once | ORAL | Status: AC
  Administered 2013-10-29: 40 meq via ORAL
  Filled 2013-10-29: qty 2

## 2013-10-29 MED ORDER — ONDANSETRON 8 MG/NS 50 ML IVPB
INTRAVENOUS | Status: AC
  Filled 2013-10-29: qty 8

## 2013-10-29 NOTE — Patient Instructions (Signed)
Hypokalemia Hypokalemia means that the amount of potassium in the blood is lower than normal.Potassium is a chemical, called an electrolyte, that helps regulate the amount of fluid in the body. It also stimulates muscle contraction and helps nerves function properly.Most of the body's potassium is inside of cells, and only a very small amount is in the blood. Because the amount in the blood is so small, minor changes can be life-threatening. CAUSES  Antibiotics.  Diarrhea or vomiting.  Using laxatives too much, which can cause diarrhea.  Chronic kidney disease.  Water pills (diuretics).  Eating disorders (bulimia).  Low magnesium level.  Sweating a lot. SIGNS AND SYMPTOMS  Weakness.  Constipation.  Fatigue.  Muscle cramps.  Mental confusion.  Skipped heartbeats or irregular heartbeat (palpitations).  Tingling or numbness. DIAGNOSIS  Your health care provider can diagnose hypokalemia with blood tests. In addition to checking your potassium level, your health care provider may also check other lab tests. TREATMENT Hypokalemia can be treated with potassium supplements taken by mouth or adjustments in your current medicines. If your potassium level is very low, you may need to get potassium through a vein (IV) and be monitored in the hospital. A diet high in potassium is also helpful. Foods high in potassium are:  Nuts, such as peanuts and pistachios.  Seeds, such as sunflower seeds and pumpkin seeds.  Peas, lentils, and lima beans.  Whole grain and bran cereals and breads.  Fresh fruit and vegetables, such as apricots, avocado, bananas, cantaloupe, kiwi, oranges, tomatoes, asparagus, and potatoes.  Orange and tomato juices.  Red meats.  Fruit yogurt. HOME CARE INSTRUCTIONS  Take all medicines as prescribed by your health care provider.  Maintain a healthy diet by including nutritious food, such as fruits, vegetables, nuts, whole grains, and lean meats.  If  you are taking a laxative, be sure to follow the directions on the label. SEEK MEDICAL CARE IF:  Your weakness gets worse.  You feel your heart pounding or racing.  You are vomiting or having diarrhea.  You are diabetic and having trouble keeping your blood glucose in the normal range. SEEK IMMEDIATE MEDICAL CARE IF:  You have chest pain, shortness of breath, or dizziness.  You are vomiting or having diarrhea for more than 2 days.  You faint. MAKE SURE YOU:   Understand these instructions.  Will watch your condition.  Will get help right away if you are not doing well or get worse. Document Released: 07/16/2005 Document Revised: 05/06/2013 Document Reviewed: 01/16/2013 [employer] Patient Information 2014 [employer].  

## 2013-10-29 NOTE — Telephone Encounter (Signed)
Pt returned my call and I informed her that Santiago Glad was leaving and we needed to reschedule her appt since I did not have a 9am available that day.  Confirmed 3:30pm on 4/20 w/ pt.

## 2013-10-30 ENCOUNTER — Ambulatory Visit (HOSPITAL_BASED_OUTPATIENT_CLINIC_OR_DEPARTMENT_OTHER): Payer: BC Managed Care – PPO

## 2013-10-30 ENCOUNTER — Telehealth: Payer: Self-pay | Admitting: *Deleted

## 2013-10-30 ENCOUNTER — Other Ambulatory Visit: Payer: Self-pay

## 2013-10-30 VITALS — BP 141/92 | HR 96 | Temp 98.9°F | Resp 18

## 2013-10-30 DIAGNOSIS — C50519 Malignant neoplasm of lower-outer quadrant of unspecified female breast: Secondary | ICD-10-CM

## 2013-10-30 DIAGNOSIS — R112 Nausea with vomiting, unspecified: Secondary | ICD-10-CM

## 2013-10-30 MED ORDER — HEPARIN SOD (PORK) LOCK FLUSH 100 UNIT/ML IV SOLN
500.0000 [IU] | Freq: Once | INTRAVENOUS | Status: AC
  Administered 2013-10-30: 500 [IU] via INTRAVENOUS
  Filled 2013-10-30: qty 5

## 2013-10-30 MED ORDER — ONDANSETRON 8 MG/NS 50 ML IVPB
INTRAVENOUS | Status: AC
  Filled 2013-10-30: qty 8

## 2013-10-30 MED ORDER — ONDANSETRON 8 MG/50ML IVPB (CHCC)
8.0000 mg | Freq: Once | INTRAVENOUS | Status: AC
  Administered 2013-10-30: 8 mg via INTRAVENOUS

## 2013-10-30 MED ORDER — POTASSIUM CHLORIDE CRYS ER 20 MEQ PO TBCR
40.0000 meq | EXTENDED_RELEASE_TABLET | Freq: Once | ORAL | Status: AC
  Administered 2013-10-30: 40 meq via ORAL
  Filled 2013-10-30: qty 2

## 2013-10-30 MED ORDER — SODIUM CHLORIDE 0.9 % IJ SOLN
10.0000 mL | INTRAMUSCULAR | Status: DC | PRN
  Administered 2013-10-30: 10 mL via INTRAVENOUS
  Filled 2013-10-30: qty 10

## 2013-10-30 MED ORDER — SODIUM CHLORIDE 0.9 % IV SOLN
INTRAVENOUS | Status: DC
  Administered 2013-10-30: 12:00:00 via INTRAVENOUS

## 2013-10-30 NOTE — Telephone Encounter (Signed)
Mailed after appt letter to pt. 

## 2013-10-30 NOTE — Patient Instructions (Signed)
Dehydration, Adult Dehydration is when you lose more fluids from the body than you take in. Vital organs like the kidneys, brain, and heart cannot function without a proper amount of fluids and salt. Any loss of fluids from the body can cause dehydration.  CAUSES   Vomiting.  Diarrhea.  Excessive sweating.  Excessive urine output.  Fever. SYMPTOMS  Mild dehydration  Thirst.  Dry lips.  Slightly dry mouth. Moderate dehydration  Very dry mouth.  Sunken eyes.  Skin does not bounce back quickly when lightly pinched and released.  Dark urine and decreased urine production.  Decreased tear production.  Headache. Severe dehydration  Very dry mouth.  Extreme thirst.  Rapid, weak pulse (more than 100 beats per minute at rest).  Cold hands and feet.  Not able to sweat in spite of heat and temperature.  Rapid breathing.  Blue lips.  Confusion and lethargy.  Difficulty being awakened.  Minimal urine production.  No tears. DIAGNOSIS  Your caregiver will diagnose dehydration based on your symptoms and your exam. Blood and urine tests will help confirm the diagnosis. The diagnostic evaluation should also identify the cause of dehydration. TREATMENT  Treatment of mild or moderate dehydration can often be done at home by increasing the amount of fluids that you drink. It is best to drink small amounts of fluid more often. Drinking too much at one time can make vomiting worse. Refer to the home care instructions below. Severe dehydration needs to be treated at the hospital where you will probably be given intravenous (IV) fluids that contain water and electrolytes. HOME CARE INSTRUCTIONS   Ask your caregiver about specific rehydration instructions.  Drink enough fluids to keep your urine clear or pale yellow.  Drink small amounts frequently if you have nausea and vomiting.  Eat as you normally do.  Avoid:  Foods or drinks high in sugar.  Carbonated  drinks.  Juice.  Extremely hot or cold fluids.  Drinks with caffeine.  Fatty, greasy foods.  Alcohol.  Tobacco.  Overeating.  Gelatin desserts.  Wash your hands well to avoid spreading bacteria and viruses.  Only take over-the-counter or prescription medicines for pain, discomfort, or fever as directed by your caregiver.  Ask your caregiver if you should continue all prescribed and over-the-counter medicines.  Keep all follow-up appointments with your caregiver. SEEK MEDICAL CARE IF:  You have abdominal pain and it increases or stays in one area (localizes).  You have a rash, stiff neck, or severe headache.  You are irritable, sleepy, or difficult to awaken.  You are weak, dizzy, or extremely thirsty. SEEK IMMEDIATE MEDICAL CARE IF:   You are unable to keep fluids down or you get worse despite treatment.  You have frequent episodes of vomiting or diarrhea.  You have blood or green matter (bile) in your vomit.  You have blood in your stool or your stool looks black and tarry.  You have not urinated in 6 to 8 hours, or you have only urinated a small amount of very dark urine.  You have a fever.  You faint. MAKE SURE YOU:   Understand these instructions.  Will watch your condition.  Will get help right away if you are not doing well or get worse. Document Released: 07/16/2005 Document Revised: 10/08/2011 Document Reviewed: 03/05/2011 Exit[employer] Patient Information 2014 [employer].  

## 2013-11-02 ENCOUNTER — Other Ambulatory Visit: Payer: Self-pay

## 2013-11-04 ENCOUNTER — Telehealth: Payer: Self-pay | Admitting: Oncology

## 2013-11-04 NOTE — Telephone Encounter (Signed)
COPY OF MEDICAL RECORDS GIVEN TO PATIENT

## 2013-11-11 NOTE — Progress Notes (Signed)
[employer] form completed and signed.  Faxed to [place].  Returned with transmission log to [place].  Copy to scan.

## 2013-11-13 ENCOUNTER — Other Ambulatory Visit: Payer: Self-pay

## 2013-11-13 ENCOUNTER — Other Ambulatory Visit (HOSPITAL_BASED_OUTPATIENT_CLINIC_OR_DEPARTMENT_OTHER): Payer: BC Managed Care – PPO

## 2013-11-13 ENCOUNTER — Ambulatory Visit (HOSPITAL_BASED_OUTPATIENT_CLINIC_OR_DEPARTMENT_OTHER): Payer: BC Managed Care – PPO | Admitting: Adult Health

## 2013-11-13 ENCOUNTER — Encounter: Payer: Self-pay | Admitting: Oncology

## 2013-11-13 ENCOUNTER — Ambulatory Visit (HOSPITAL_BASED_OUTPATIENT_CLINIC_OR_DEPARTMENT_OTHER): Payer: BC Managed Care – PPO

## 2013-11-13 ENCOUNTER — Other Ambulatory Visit: Payer: Self-pay | Admitting: *Deleted

## 2013-11-13 ENCOUNTER — Encounter: Payer: Self-pay | Admitting: Adult Health

## 2013-11-13 VITALS — BP 130/84 | HR 111 | Temp 98.9°F | Resp 18 | Ht 64.0 in | Wt 179.6 lb

## 2013-11-13 DIAGNOSIS — R209 Unspecified disturbances of skin sensation: Secondary | ICD-10-CM

## 2013-11-13 DIAGNOSIS — K5900 Constipation, unspecified: Secondary | ICD-10-CM

## 2013-11-13 DIAGNOSIS — Z5112 Encounter for antineoplastic immunotherapy: Secondary | ICD-10-CM

## 2013-11-13 DIAGNOSIS — Z5111 Encounter for antineoplastic chemotherapy: Secondary | ICD-10-CM

## 2013-11-13 DIAGNOSIS — Z170 Estrogen receptor positive status [ER+]: Secondary | ICD-10-CM

## 2013-11-13 DIAGNOSIS — C50519 Malignant neoplasm of lower-outer quadrant of unspecified female breast: Secondary | ICD-10-CM

## 2013-11-13 DIAGNOSIS — E860 Dehydration: Secondary | ICD-10-CM

## 2013-11-13 LAB — CBC WITH DIFFERENTIAL/PLATELET
BASO%: 0 % (ref 0.0–2.0)
BASOS ABS: 0 10*3/uL (ref 0.0–0.1)
EOS ABS: 0 10*3/uL (ref 0.0–0.5)
EOS%: 0 % (ref 0.0–7.0)
HCT: 33.2 % — ABNORMAL LOW (ref 34.8–46.6)
HGB: 11.3 g/dL — ABNORMAL LOW (ref 11.6–15.9)
LYMPH%: 9.3 % — AB (ref 14.0–49.7)
MCH: 32.7 pg (ref 25.1–34.0)
MCHC: 34 g/dL (ref 31.5–36.0)
MCV: 96 fL (ref 79.5–101.0)
MONO#: 0.4 10*3/uL (ref 0.1–0.9)
MONO%: 5.2 % (ref 0.0–14.0)
NEUT%: 85.5 % — ABNORMAL HIGH (ref 38.4–76.8)
NEUTROS ABS: 6.6 10*3/uL — AB (ref 1.5–6.5)
PLATELETS: 323 10*3/uL (ref 145–400)
RBC: 3.46 10*6/uL — ABNORMAL LOW (ref 3.70–5.45)
RDW: 15.5 % — AB (ref 11.2–14.5)
WBC: 7.7 10*3/uL (ref 3.9–10.3)
lymph#: 0.7 10*3/uL — ABNORMAL LOW (ref 0.9–3.3)

## 2013-11-13 LAB — COMPREHENSIVE METABOLIC PANEL (CC13)
ALK PHOS: 60 U/L (ref 40–150)
ALT: 24 U/L (ref 0–55)
AST: 19 U/L (ref 5–34)
Albumin: 3.9 g/dL (ref 3.5–5.0)
Anion Gap: 12 mEq/L — ABNORMAL HIGH (ref 3–11)
BUN: 14.5 mg/dL (ref 7.0–26.0)
CO2: 26 mEq/L (ref 22–29)
Calcium: 9.8 mg/dL (ref 8.4–10.4)
Chloride: 104 mEq/L (ref 98–109)
Creatinine: 0.8 mg/dL (ref 0.6–1.1)
GLUCOSE: 168 mg/dL — AB (ref 70–140)
Potassium: 3.5 mEq/L (ref 3.5–5.1)
Sodium: 141 mEq/L (ref 136–145)
Total Bilirubin: 0.56 mg/dL (ref 0.20–1.20)
Total Protein: 7.7 g/dL (ref 6.4–8.3)

## 2013-11-13 MED ORDER — ACETAMINOPHEN 325 MG PO TABS
ORAL_TABLET | ORAL | Status: AC
  Filled 2013-11-13: qty 2

## 2013-11-13 MED ORDER — HEPARIN SOD (PORK) LOCK FLUSH 100 UNIT/ML IV SOLN
500.0000 [IU] | Freq: Once | INTRAVENOUS | Status: AC | PRN
  Administered 2013-11-13: 500 [IU]
  Filled 2013-11-13: qty 5

## 2013-11-13 MED ORDER — PERTUZUMAB CHEMO INJECTION 420 MG/14ML
420.0000 mg | Freq: Once | INTRAVENOUS | Status: AC
  Administered 2013-11-13: 420 mg via INTRAVENOUS
  Filled 2013-11-13: qty 14

## 2013-11-13 MED ORDER — ACETAMINOPHEN 325 MG PO TABS
650.0000 mg | ORAL_TABLET | Freq: Once | ORAL | Status: AC
  Administered 2013-11-13: 650 mg via ORAL

## 2013-11-13 MED ORDER — DEXAMETHASONE SODIUM PHOSPHATE 20 MG/5ML IJ SOLN
12.0000 mg | Freq: Once | INTRAMUSCULAR | Status: AC
  Administered 2013-11-13: 12 mg via INTRAVENOUS

## 2013-11-13 MED ORDER — GOSERELIN ACETATE 3.6 MG ~~LOC~~ IMPL
3.6000 mg | DRUG_IMPLANT | Freq: Once | SUBCUTANEOUS | Status: AC
  Administered 2013-11-13: 3.6 mg via SUBCUTANEOUS
  Filled 2013-11-13: qty 3.6

## 2013-11-13 MED ORDER — DEXAMETHASONE SODIUM PHOSPHATE 20 MG/5ML IJ SOLN
INTRAMUSCULAR | Status: AC
  Filled 2013-11-13: qty 5

## 2013-11-13 MED ORDER — TRASTUZUMAB CHEMO INJECTION 440 MG
6.0000 mg/kg | Freq: Once | INTRAVENOUS | Status: AC
  Administered 2013-11-13: 462 mg via INTRAVENOUS
  Filled 2013-11-13: qty 22

## 2013-11-13 MED ORDER — SODIUM CHLORIDE 0.9 % IV SOLN
Freq: Once | INTRAVENOUS | Status: AC
  Administered 2013-11-13: 10:00:00 via INTRAVENOUS

## 2013-11-13 MED ORDER — SODIUM CHLORIDE 0.9 % IJ SOLN
10.0000 mL | INTRAMUSCULAR | Status: DC | PRN
  Administered 2013-11-13: 10 mL
  Filled 2013-11-13: qty 10

## 2013-11-13 MED ORDER — DEXAMETHASONE 4 MG PO TABS: 8.0000 mg | ORAL_TABLET | Freq: Two times a day (BID) | ORAL | Status: DC

## 2013-11-13 MED ORDER — SODIUM CHLORIDE 0.9 % IV SOLN
75.0000 mg/m2 | Freq: Once | INTRAVENOUS | Status: AC
  Administered 2013-11-13: 140 mg via INTRAVENOUS
  Filled 2013-11-13: qty 14

## 2013-11-13 MED ORDER — LORAZEPAM 2 MG/ML IJ SOLN
INTRAMUSCULAR | Status: AC
  Filled 2013-11-13: qty 1

## 2013-11-13 MED ORDER — PALONOSETRON HCL INJECTION 0.25 MG/5ML
INTRAVENOUS | Status: AC
  Filled 2013-11-13: qty 5

## 2013-11-13 MED ORDER — PALONOSETRON HCL INJECTION 0.25 MG/5ML
0.2500 mg | Freq: Once | INTRAVENOUS | Status: AC
  Administered 2013-11-13: 0.25 mg via INTRAVENOUS

## 2013-11-13 MED ORDER — LORAZEPAM 2 MG/ML IJ SOLN
0.5000 mg | Freq: Once | INTRAMUSCULAR | Status: AC
  Administered 2013-11-13: 0.5 mg via INTRAVENOUS

## 2013-11-13 MED ORDER — SODIUM CHLORIDE 0.9 % IV SOLN
150.0000 mg | Freq: Once | INTRAVENOUS | Status: AC
  Administered 2013-11-13: 150 mg via INTRAVENOUS
  Filled 2013-11-13: qty 5

## 2013-11-13 MED ORDER — SODIUM CHLORIDE 0.9 % IV SOLN
703.8000 mg | Freq: Once | INTRAVENOUS | Status: AC
  Administered 2013-11-13: 700 mg via INTRAVENOUS
  Filled 2013-11-13: qty 70

## 2013-11-13 MED ORDER — DIPHENHYDRAMINE HCL 25 MG PO CAPS
50.0000 mg | ORAL_CAPSULE | Freq: Once | ORAL | Status: AC
  Administered 2013-11-13: 50 mg via ORAL

## 2013-11-13 MED ORDER — DIPHENHYDRAMINE HCL 25 MG PO CAPS
ORAL_CAPSULE | ORAL | Status: AC
  Filled 2013-11-13: qty 2

## 2013-11-13 MED ORDER — NYSTATIN 100000 UNIT/ML MT SUSP: 5.0000 mL | Freq: Four times a day (QID) | OROMUCOSAL | Status: DC

## 2013-11-13 NOTE — Patient Instructions (Signed)
[facility] Discharge Instructions for Patients Receiving Chemotherapy  Today you received the following chemotherapy agents Herceptin/perjeta/Taxotere/Carboplatin/Zoladex.  To help prevent nausea and vomiting after your treatment, we encourage you to take your nausea medication as prescribed.   If you develop nausea and vomiting that is not controlled by your nausea medication, call the clinic.   BELOW ARE SYMPTOMS THAT SHOULD BE REPORTED IMMEDIATELY:  *FEVER GREATER THAN 100.5 F  *CHILLS WITH OR WITHOUT FEVER  NAUSEA AND VOMITING THAT IS NOT CONTROLLED WITH YOUR NAUSEA MEDICATION  *UNUSUAL SHORTNESS OF BREATH  *UNUSUAL BRUISING OR BLEEDING  TENDERNESS IN MOUTH AND THROAT WITH OR WITHOUT PRESENCE OF ULCERS  *URINARY PROBLEMS  *BOWEL PROBLEMS  UNUSUAL RASH Items with * indicate a potential emergency and should be followed up as soon as possible.  Feel free to call the clinic you have any questions or concerns. The clinic phone number is [phone].

## 2013-11-13 NOTE — Progress Notes (Signed)
Hematology and Oncology Follow Up Visit  Tina [name] [id number] 1961/01/15 53 y.o. 11/15/2013 7:43 AM     Principle Diagnosis:Tina [name] 53 y.o. female with clinical stage IIA ER positive, HER-2/neu positive invasive ductal carcinoma of the right breast.     Prior Therapy:  1. Underwent screening mammogram. On the right side she was noted to have a mass with microcalcifications. Ultrasound in February 2015 showed a 1.2 x 1.1 x 1.0 cm rounded hypoechoic mass with irregular margins in the 8:00 position 4 cm from the nipple. No abnormal axillary lymph nodes were noted. She had MRI of the breasts performed that showed in the lower outer right breast 2.2 x 1.3 x 1.5 cm Irregular mass. In the upper outer left breast there was a 7 x 5 x 8 cm indeterminate enhancing mass. Also another 9 x 7 x 6 mm indeterminant enhancing mass at 9:00 in the left breast was noted.there are also noted to be ominent cortices of right axillary lymph nodes. Patient has undergone biopsy of these. On the right patient had biopsy performed of the 1.2 cm mass. The pathology revealed grade 3 invasive ductal carcinoma with ductal carcinoma in situ. Tumor was ER +70% PR +6% HER-2/neu was amplified and proliferation marker Ki-67 70%   2. Referred to genetic counseling: upcoming appointment is scheduled.   3. Neoadjuvant TCH/Perjeta every 3 weeks x 6 cycles beginning 10/02/13 followed by breast surgery    Current therapy:  Neoadjuvant Taxotere, Carboplatin, Herceptin, Perjeta cycle 3 day 1  Interim History: Tina [name] 53 y.o. female with clinical stage IIA ER positive, HER-2/neu positive invasive ductal carcinoma of the right breast.  She is here for evaluation prior to receiving cycle 3 day one of Taxotere, Carboplatin, Herceptin, and Perjeta given in the neoadjuvant setting with Neulasta support given on day 2.  She is accompanied by her husband Tina [name] today.  She is feeling very well today.  She is suffering from  constipation that she typically gets her third week following treatment due to her diet.  She tells me that her stools are hard and painful.  She does have hemorrhoids that are bleeding slightly when wiping, and she is applying Preparation H to these daily.  She is having bowel movements once or twice a day.  She thinks that this will help work itself out because she does typically have diarrhea x 2 days following treatment.  She does have numbness, and nausea/vomiting the week after treatment.  Otherwise, she is doing well and denies fevers, chills, nausea, vomiting, skin or nail changes, mouth pain, or any further concerns.   Medications:  Current Outpatient Prescriptions  Medication Sig Dispense Refill  . Alum & Mag Hydroxide-Simeth (MAGIC MOUTHWASH) SOLN Take 5 mLs by mouth 4 (four) times daily.  100 mL  2  . ascorbic acid (VITAMIN C) 1000 MG tablet Take 1,000 mg by mouth daily.      Marland Kitchen aspirin 81 MG tablet Take 81 mg by mouth daily.      Marland Kitchen BIOTIN 5000 PO Take by mouth daily.       . Calcium Carbonate-Vit D-Min (CALTRATE 600+D PLUS MINERALS PO) Take 1 tablet by mouth daily.      . Cholecalciferol (VITAMIN D3) 10000 UNITS capsule Take 10,000 Units by mouth daily.      . Coenzyme Q10 (CO Q 10 PO) Take by mouth daily.      Marland Kitchen dexamethasone (DECADRON) 4 MG tablet Take 2 tablets (8 mg total) by mouth 2 (two)  times daily. Start the day before Taxotere. Then again the day after chemo for 3 days.  30 tablet  1  . L-ARGININE PO Take by mouth daily.      Marland Kitchen lidocaine-prilocaine (EMLA) cream Apply topically as needed.  30 g  6  . lisinopril-hydrochlorothiazide (PRINZIDE,ZESTORETIC) 20-12.5 MG per tablet Take 1 tablet by mouth 2 (two) times daily.      Marland Kitchen nystatin (MYCOSTATIN) 100000 UNIT/ML suspension Take 5 mLs (500,000 Units total) by mouth 4 (four) times daily.  60 mL  0  . omeprazole (PRILOSEC) 40 MG capsule Take 1 capsule (40 mg total) by mouth daily.  30 capsule  6  . potassium chloride SA  (K-DUR,KLOR-CON) 20 MEQ tablet Take 1 tablet (20 mEq total) by mouth once. X 3 days, then stop  5 tablet  0  . Probiotic Product (TRUBIOTICS) CAPS Take 1 capsule by mouth daily.      Marland Kitchen VITAMIN E PO Take 1 capsule by mouth daily.       Marland Kitchen LORazepam (ATIVAN) 0.5 MG tablet Take 1 tablet (0.5 mg total) by mouth every 6 (six) hours as needed (Nausea or vomiting).  30 tablet  0  . ondansetron (ZOFRAN) 8 MG tablet Take 1 tablet (8 mg total) by mouth 2 (two) times daily. Start the day after chemo for 3 days. Then take as needed for nausea or vomiting.  30 tablet  1  . OVER THE COUNTER MEDICATION Take 1 tablet by mouth daily. Trace Minerals      . oxyCODONE (OXY IR/ROXICODONE) 5 MG immediate release tablet Take 1 tablet (5 mg total) by mouth every 4 (four) hours as needed for severe pain.  60 tablet  0  . prochlorperazine (COMPAZINE) 10 MG tablet Take 1 tablet (10 mg total) by mouth every 6 (six) hours as needed (Nausea or vomiting).  30 tablet  1   No current facility-administered medications for this visit.     Allergies:  Allergies  Allergen Reactions  . Adhesive [Tape]   . Nickel     Medical History: Past Medical History  Diagnosis Date  . Leukocytopenia   . Hypertension   . Degenerative joint disease   . Leukopenia 06/16/2013  . Needs flu shot 06/17/2013  . Cancer of right breast 09/03/13    Invasive Ductal Carcinoma/Ductal Carcinoma Insitu    Surgical History:  Past Surgical History  Procedure Laterality Date  . Cesarean section    . Carpal tunnel release    . Trigger finger release       Review of Systems: A 10 point review of systems was conducted and is otherwise negative except for what is noted above.     Physical Exam: Blood pressure 130/84, pulse 111, temperature 98.9 F (37.2 C), temperature source Oral, resp. rate 18, height '5\' 4"'  (1.626 m), weight 179 lb 9.6 oz (81.466 kg). GENERAL: Patient is a well appearing female in no acute distress HEENT:  Sclerae anicteric.   Oropharynx clear and moist. No ulcerations or evidence of oropharyngeal candidiasis. Neck is supple.  NODES:  No cervical, supraclavicular, or axillary lymphadenopathy palpated.  BREAST EXAM:  Deferred. LUNGS:  Clear to auscultation bilaterally.  No wheezes or rhonchi. HEART:  Regular rate and rhythm. No murmur appreciated. ABDOMEN:  Soft, nontender.  Positive, normoactive bowel sounds. No organomegaly palpated. MSK:  No focal spinal tenderness to palpation. Full range of motion bilaterally in the upper extremities. EXTREMITIES:  No peripheral edema.   SKIN:  Clear with no obvious  rashes or skin changes. No nail dyscrasia. NEURO:  Nonfocal. Well oriented.  Appropriate affect. ECOG PERFORMANCE STATUS: 1 - Symptomatic but completely ambulatory   Lab Results: Lab Results  Component Value Date   WBC 7.7 11/13/2013   HGB 11.3* 11/13/2013   HCT 33.2* 11/13/2013   MCV 96.0 11/13/2013   PLT 323 11/13/2013     Chemistry      Component Value Date/Time   NA 141 11/13/2013 0832   NA 138 09/25/2013 1320   K 3.5 11/13/2013 0832   K 3.6* 09/25/2013 1320   CL 98 09/25/2013 1320   CL 104 06/16/2012 1303   CO2 26 11/13/2013 0832   CO2 27 09/25/2013 1320   BUN 14.5 11/13/2013 0832   BUN 9 09/25/2013 1320   CREATININE 0.8 11/13/2013 0832   CREATININE 0.82 09/25/2013 1320      Component Value Date/Time   CALCIUM 9.8 11/13/2013 0832   CALCIUM 10.0 09/25/2013 1320   ALKPHOS 60 11/13/2013 0832   ALKPHOS 42 10/04/2012 1830   AST 19 11/13/2013 0832   AST 32 10/04/2012 1830   ALT 24 11/13/2013 0832   ALT 27 10/04/2012 1830   BILITOT 0.56 11/13/2013 0832   BILITOT 0.7 10/04/2012 1830       Assessment and Plan: [name] 53 y.o. female  1. Clinical stage IIA ER positive, HER-2/neu positive invasive ductal carcinoma of the right breast. Patient is currently undergoing neoadjuvant chemotherapy with Taxotere, Carboplatin, Herceptin and Perjeta given once every 21 days, with Neulasta given on day 2. Her CBC is stable.   I reviewed this with her in detail.  She will proceed with cycle 3 day 1 of Taxotere, Carboplatin, Herceptin and Perjeta.  Her CMP is pending.  She will also receive Zoladex today.    2. Cardiac:  Her last echocardiogram on 09/29/2013 demonstrated a LVEF of 65-70%.  She was evaluated by Dr. Vaughan Browner on 10/19/13 in the cardio-onc clinic.  He reviewed her echo, and cleared her to continue with Herceptin.  3 month f/u with echocardiogram was recommended.   3.  Constipation:  I recommended she get Colace and take two tablets tonight.  Then during the third week after treatment I recommended she start Colace 2 tablets BID.  I recommended she continue using preparation H for her hemorrhoids.    4. CINV:  The patient does suffer from nausea/vomiting following treatment.  We did change her treatment premedications to Fosaprepitant and Aloxi, which she states improved this.  We discussed her anti-emetic regimen in detail.  She does get dehydrated due to this.  I will make sure she is scheduled for IV fluids on 11/20/13.  5.  Numbness:  She is c/o of numbness in her fingertips the week following treatment.  I recommended Super B complex daily for this.    6. Dehydration:  She does get dehydrated due to diarrhea following treatment.  She will return tomorrow, Monday and Tuesday following her chemotherapy for IV fluids.    The patient will return tomorrow for Neulasta and IV fluids, Monday and Tuesday for IV fluids, and in one week for labs, follow up and IV fluids.   She and her husband know to call us in the interim for any questions or concerns.  We can certainly see her sooner if needed.  I spent 25 minutes counseling the patient face to face.  The total time spent in the appointment was 30 minutes.  Minette Headland, NP Medical Oncology [facility] [phone] 11/15/2013 7:43 AM

## 2013-11-13 NOTE — Progress Notes (Signed)
Pt to come in 4/18 for infusion.  Orders entered

## 2013-11-13 NOTE — Patient Instructions (Signed)
Take Colace 2 tablets twice a day.  Super B complex daily for the numbness.

## 2013-11-14 ENCOUNTER — Other Ambulatory Visit: Payer: Self-pay | Admitting: Nurse Practitioner

## 2013-11-14 ENCOUNTER — Ambulatory Visit (HOSPITAL_BASED_OUTPATIENT_CLINIC_OR_DEPARTMENT_OTHER): Payer: BC Managed Care – PPO

## 2013-11-14 VITALS — BP 134/78 | HR 95

## 2013-11-14 DIAGNOSIS — Z5189 Encounter for other specified aftercare: Secondary | ICD-10-CM

## 2013-11-14 DIAGNOSIS — R112 Nausea with vomiting, unspecified: Secondary | ICD-10-CM

## 2013-11-14 DIAGNOSIS — C50519 Malignant neoplasm of lower-outer quadrant of unspecified female breast: Secondary | ICD-10-CM

## 2013-11-14 MED ORDER — PEGFILGRASTIM INJECTION 6 MG/0.6ML
6.0000 mg | Freq: Once | SUBCUTANEOUS | Status: AC
  Administered 2013-11-14: 6 mg via SUBCUTANEOUS

## 2013-11-14 MED ORDER — SODIUM CHLORIDE 0.9 % IV SOLN
INTRAVENOUS | Status: DC
  Administered 2013-11-14: 10:00:00 via INTRAVENOUS

## 2013-11-14 MED ORDER — HEPARIN SOD (PORK) LOCK FLUSH 100 UNIT/ML IV SOLN
500.0000 [IU] | Freq: Once | INTRAVENOUS | Status: AC
  Administered 2013-11-14: 500 [IU] via INTRAVENOUS
  Filled 2013-11-14: qty 5

## 2013-11-14 MED ORDER — SODIUM CHLORIDE 0.9 % IJ SOLN
10.0000 mL | INTRAMUSCULAR | Status: DC | PRN
  Administered 2013-11-14: 10 mL via INTRAVENOUS
  Filled 2013-11-14: qty 10

## 2013-11-14 MED ORDER — ONDANSETRON 16 MG/50ML IVPB (CHCC)
16.0000 mg | Freq: Once | INTRAVENOUS | Status: AC
  Administered 2013-11-14: 16 mg via INTRAVENOUS

## 2013-11-14 NOTE — Patient Instructions (Signed)
Dehydration, Adult Dehydration is when you lose more fluids from the body than you take in. Vital organs like the kidneys, brain, and heart cannot function without a proper amount of fluids and salt. Any loss of fluids from the body can cause dehydration.  CAUSES   Vomiting.  Diarrhea.  Excessive sweating.  Excessive urine output.  Fever. SYMPTOMS  Mild dehydration  Thirst.  Dry lips.  Slightly dry mouth. Moderate dehydration  Very dry mouth.  Sunken eyes.  Skin does not bounce back quickly when lightly pinched and released.  Dark urine and decreased urine production.  Decreased tear production.  Headache. Severe dehydration  Very dry mouth.  Extreme thirst.  Rapid, weak pulse (more than 100 beats per minute at rest).  Cold hands and feet.  Not able to sweat in spite of heat and temperature.  Rapid breathing.  Blue lips.  Confusion and lethargy.  Difficulty being awakened.  Minimal urine production.  No tears. DIAGNOSIS  Your caregiver will diagnose dehydration based on your symptoms and your exam. Blood and urine tests will help confirm the diagnosis. The diagnostic evaluation should also identify the cause of dehydration. TREATMENT  Treatment of mild or moderate dehydration can often be done at home by increasing the amount of fluids that you drink. It is best to drink small amounts of fluid more often. Drinking too much at one time can make vomiting worse. Refer to the home care instructions below. Severe dehydration needs to be treated at the hospital where you will probably be given intravenous (IV) fluids that contain water and electrolytes. HOME CARE INSTRUCTIONS   Ask your caregiver about specific rehydration instructions.  Drink enough fluids to keep your urine clear or pale yellow.  Drink small amounts frequently if you have nausea and vomiting.  Eat as you normally do.  Avoid:  Foods or drinks high in sugar.  Carbonated  drinks.  Juice.  Extremely hot or cold fluids.  Drinks with caffeine.  Fatty, greasy foods.  Alcohol.  Tobacco.  Overeating.  Gelatin desserts.  Wash your hands well to avoid spreading bacteria and viruses.  Only take over-the-counter or prescription medicines for pain, discomfort, or fever as directed by your caregiver.  Ask your caregiver if you should continue all prescribed and over-the-counter medicines.  Keep all follow-up appointments with your caregiver. SEEK MEDICAL CARE IF:  You have abdominal pain and it increases or stays in one area (localizes).  You have a rash, stiff neck, or severe headache.  You are irritable, sleepy, or difficult to awaken.  You are weak, dizzy, or extremely thirsty. SEEK IMMEDIATE MEDICAL CARE IF:   You are unable to keep fluids down or you get worse despite treatment.  You have frequent episodes of vomiting or diarrhea.  You have blood or green matter (bile) in your vomit.  You have blood in your stool or your stool looks black and tarry.  You have not urinated in 6 to 8 hours, or you have only urinated a small amount of very dark urine.  You have a fever.  You faint. MAKE SURE YOU:   Understand these instructions.  Will watch your condition.  Will get help right away if you are not doing well or get worse. Document Released: 07/16/2005 Document Revised: 10/08/2011 Document Reviewed: 03/05/2011 Exit[employer] Patient Information 2014 [employer].  

## 2013-11-16 ENCOUNTER — Ambulatory Visit (HOSPITAL_BASED_OUTPATIENT_CLINIC_OR_DEPARTMENT_OTHER): Payer: BC Managed Care – PPO

## 2013-11-16 ENCOUNTER — Ambulatory Visit (HOSPITAL_BASED_OUTPATIENT_CLINIC_OR_DEPARTMENT_OTHER): Payer: BC Managed Care – PPO | Admitting: Genetic Counselor

## 2013-11-16 ENCOUNTER — Other Ambulatory Visit: Payer: Self-pay

## 2013-11-16 ENCOUNTER — Other Ambulatory Visit: Payer: BC Managed Care – PPO

## 2013-11-16 ENCOUNTER — Other Ambulatory Visit: Payer: Self-pay | Admitting: Adult Health

## 2013-11-16 ENCOUNTER — Encounter: Payer: Self-pay | Admitting: Oncology

## 2013-11-16 ENCOUNTER — Telehealth: Payer: Self-pay | Admitting: Oncology

## 2013-11-16 ENCOUNTER — Telehealth: Payer: Self-pay | Admitting: *Deleted

## 2013-11-16 VITALS — BP 137/80 | HR 97 | Temp 98.1°F | Resp 18

## 2013-11-16 DIAGNOSIS — C50919 Malignant neoplasm of unspecified site of unspecified female breast: Secondary | ICD-10-CM

## 2013-11-16 DIAGNOSIS — E860 Dehydration: Secondary | ICD-10-CM

## 2013-11-16 DIAGNOSIS — Z808 Family history of malignant neoplasm of other organs or systems: Secondary | ICD-10-CM

## 2013-11-16 DIAGNOSIS — Z8049 Family history of malignant neoplasm of other genital organs: Secondary | ICD-10-CM

## 2013-11-16 DIAGNOSIS — C50519 Malignant neoplasm of lower-outer quadrant of unspecified female breast: Secondary | ICD-10-CM

## 2013-11-16 DIAGNOSIS — R112 Nausea with vomiting, unspecified: Secondary | ICD-10-CM

## 2013-11-16 MED ORDER — SODIUM CHLORIDE 0.9 % IV SOLN
Freq: Once | INTRAVENOUS | Status: AC
  Administered 2013-11-16: 11:00:00 via INTRAVENOUS

## 2013-11-16 MED ORDER — SODIUM CHLORIDE 0.9 % IJ SOLN
10.0000 mL | INTRAMUSCULAR | Status: DC | PRN
  Administered 2013-11-16: 10 mL via INTRAVENOUS
  Filled 2013-11-16: qty 10

## 2013-11-16 MED ORDER — ONDANSETRON 16 MG/50ML IVPB (CHCC)
16.0000 mg | Freq: Once | INTRAVENOUS | Status: AC
  Administered 2013-11-16: 16 mg via INTRAVENOUS

## 2013-11-16 MED ORDER — HEPARIN SOD (PORK) LOCK FLUSH 100 UNIT/ML IV SOLN
500.0000 [IU] | Freq: Once | INTRAVENOUS | Status: AC
  Administered 2013-11-16: 500 [IU] via INTRAVENOUS
  Filled 2013-11-16: qty 5

## 2013-11-16 MED ORDER — ONDANSETRON 16 MG/50ML IVPB (CHCC)
INTRAVENOUS | Status: AC
  Filled 2013-11-16: qty 16

## 2013-11-16 NOTE — Telephone Encounter (Signed)
pt husband came in to get schedule today. appts for IVF's 4/21 and f/u 4/24 per 4/17 pof added. husband has new schedule.

## 2013-11-16 NOTE — Progress Notes (Signed)
Per Celso Amy the patient has insurance and met deds have been met. No asst available.

## 2013-11-16 NOTE — Patient Instructions (Signed)
Dehydration, Adult Dehydration is when you lose more fluids from the body than you take in. Vital organs like the kidneys, brain, and heart cannot function without a proper amount of fluids and salt. Any loss of fluids from the body can cause dehydration.  CAUSES   Vomiting.  Diarrhea.  Excessive sweating.  Excessive urine output.  Fever. SYMPTOMS  Mild dehydration  Thirst.  Dry lips.  Slightly dry mouth. Moderate dehydration  Very dry mouth.  Sunken eyes.  Skin does not bounce back quickly when lightly pinched and released.  Dark urine and decreased urine production.  Decreased tear production.  Headache. Severe dehydration  Very dry mouth.  Extreme thirst.  Rapid, weak pulse (more than 100 beats per minute at rest).  Cold hands and feet.  Not able to sweat in spite of heat and temperature.  Rapid breathing.  Blue lips.  Confusion and lethargy.  Difficulty being awakened.  Minimal urine production.  No tears. DIAGNOSIS  Your caregiver will diagnose dehydration based on your symptoms and your exam. Blood and urine tests will help confirm the diagnosis. The diagnostic evaluation should also identify the cause of dehydration. TREATMENT  Treatment of mild or moderate dehydration can often be done at home by increasing the amount of fluids that you drink. It is best to drink small amounts of fluid more often. Drinking too much at one time can make vomiting worse. Refer to the home care instructions below. Severe dehydration needs to be treated at the hospital where you will probably be given intravenous (IV) fluids that contain water and electrolytes. HOME CARE INSTRUCTIONS   Ask your caregiver about specific rehydration instructions.  Drink enough fluids to keep your urine clear or pale yellow.  Drink small amounts frequently if you have nausea and vomiting.  Eat as you normally do.  Avoid:  Foods or drinks high in sugar.  Carbonated  drinks.  Juice.  Extremely hot or cold fluids.  Drinks with caffeine.  Fatty, greasy foods.  Alcohol.  Tobacco.  Overeating.  Gelatin desserts.  Wash your hands well to avoid spreading bacteria and viruses.  Only take over-the-counter or prescription medicines for pain, discomfort, or fever as directed by your caregiver.  Ask your caregiver if you should continue all prescribed and over-the-counter medicines.  Keep all follow-up appointments with your caregiver. SEEK MEDICAL CARE IF:  You have abdominal pain and it increases or stays in one area (localizes).  You have a rash, stiff neck, or severe headache.  You are irritable, sleepy, or difficult to awaken.  You are weak, dizzy, or extremely thirsty. SEEK IMMEDIATE MEDICAL CARE IF:   You are unable to keep fluids down or you get worse despite treatment.  You have frequent episodes of vomiting or diarrhea.  You have blood or green matter (bile) in your vomit.  You have blood in your stool or your stool looks black and tarry.  You have not urinated in 6 to 8 hours, or you have only urinated a small amount of very dark urine.  You have a fever.  You faint. MAKE SURE YOU:   Understand these instructions.  Will watch your condition.  Will get help right away if you are not doing well or get worse. Document Released: 07/16/2005 Document Revised: 10/08/2011 Document Reviewed: 03/05/2011 Exit[employer] Patient Information 2014 [employer].  

## 2013-11-16 NOTE — Progress Notes (Signed)
HISTORY OF PRESENT ILLNESS: Dr. Humphrey Rolls requested a cancer genetics consultation for Tina [name], a 53 y.o. female, due to a personal history of breast cancer.  Tina [name] presents to clinic today to discuss the possibility of a hereditary predisposition to cancer, genetic testing, and to further clarify her future cancer risks, as well as potential cancer risk for family members. Tina [name] was diagnosed with unilateral triple positive IDC of the right breast at age 53. She is undergoing neoadjuvant chemotherapy. Surgery is planned for July but no currently scheduled. Radiation will follow surgery. She has no history of other cancer.   Past Medical History  Diagnosis Date   Leukocytopenia    Hypertension    Degenerative joint disease    Leukopenia 06/16/2013   Needs flu shot 06/17/2013   Cancer of right breast 09/03/13    Invasive Ductal Carcinoma/Ductal Carcinoma Insitu    Past Surgical History  Procedure Laterality Date   Cesarean section     Carpal tunnel release     Trigger finger release      History   Social History   Marital Status: Married    Spouse Name: N/A    Number of Children: 1   Years of Education: N/A   Occupational History        homemaker   Social History Main Topics   Smoking status: Former Smoker -- 1.00 packs/day for 15 years    Types: Cigars, Cigarettes   Smokeless tobacco: Never Used     Comment: remote smoking history   Alcohol Use: 0.0 oz/week    0 Shots of liquor per week     Comment: occ    Drug Use: Yes     Comment: Marjuana   Sexual Activity: Yes   Other Topics Concern   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  Significant diagnoses include the following:  Family History  Problem Relation Age of Onset   Hypertension Mother    Dementia Mother     small vessel disease   Cancer Mother 81    cervical  cancer   Cancer Father 55    prostate ca   Diabetes Father    Arthritis Father     Tina [name]'s ancestry is of African American descent. There is no known Jewish ancestry or consanguinity.  GENETIC COUNSELING ASSESSMENT: Tina [name] is a 53 y.o. female with a personal history of triple positive breast cancer diagnosed at age 60. She has a very large paternal and maternal family history and there is no other cancer in the family suggestive of a hereditary predisposition to cancer. We, therefore, discussed and recommended the following at today's visit.   DISCUSSION: We reviewed the characteristics, features and inheritance patterns of hereditary cancer syndromes. We also discussed genetic testing, including the appropriate family members to test, the process of testing, insurance coverage and turn-around-time for results. We discussed the implications of a negative, positive and/or variant of uncertain significant result. We discussed with Tina [name] that the family history is not highly consistent with a familial hereditary cancer  syndrome, and we feel she is at low risk to harbor a gene mutation associated with such a condition. Thus, we did not recommend any genetic testing, at this time, and feel it is unlikely that her insurance will cover such testing. However, Tina [name] wished to pursue testing anyway as she would like to use this information for surgical decisions.   PLAN: Despite our conversation about the low risk for Tina [name] to harbour a hereditary cancer gene mutation, based on Tina [name]'s wish to pursue genetic testing, her blood blood sample was drawn and will be sent to [facility] for analysis of the BRCA1 and BRCA2 genes. If completed, results should be available  within approximately 2 weeks time, at which point they will be disclosed by telephone to Tina [name], as will any additional recommendations warranted by these results. We encouraged Tina [name] to remain in contact with cancer genetics annually so that we can continuously update the family history and inform her of any changes in cancer genetics and testing that may be of benefit for this family. [name] questions were answered to her satisfaction today. Our contact information was provided should additional questions or concerns arise.   Thank you for the referral and allowing [place] to share in the care of your patient.   The patient was seen for a total of 45 minutes in face-to-face counseling.  This patient was discussed with Humphrey Rolls who agrees with the above.    _______________________________________________________________________ For Office Staff:  Number of people involved in session: 4 Was an Intern/ student involved with case: no

## 2013-11-16 NOTE — Progress Notes (Signed)
Per [employer] the [employer] covers Aloxi. No asst is available.

## 2013-11-16 NOTE — Telephone Encounter (Signed)
Per staff message and POF I have scheduled appts.  JMW  

## 2013-11-17 ENCOUNTER — Ambulatory Visit (HOSPITAL_BASED_OUTPATIENT_CLINIC_OR_DEPARTMENT_OTHER): Payer: BC Managed Care – PPO

## 2013-11-17 VITALS — BP 113/73 | HR 88 | Temp 98.1°F | Resp 20

## 2013-11-17 DIAGNOSIS — C50519 Malignant neoplasm of lower-outer quadrant of unspecified female breast: Secondary | ICD-10-CM

## 2013-11-17 DIAGNOSIS — E860 Dehydration: Secondary | ICD-10-CM

## 2013-11-17 MED ORDER — HEPARIN SOD (PORK) LOCK FLUSH 100 UNIT/ML IV SOLN
500.0000 [IU] | Freq: Once | INTRAVENOUS | Status: AC
  Administered 2013-11-17: 500 [IU] via INTRAVENOUS
  Filled 2013-11-17: qty 5

## 2013-11-17 MED ORDER — ONDANSETRON 16 MG/50ML IVPB (CHCC)
16.0000 mg | Freq: Once | INTRAVENOUS | Status: AC
  Administered 2013-11-17: 16 mg via INTRAVENOUS

## 2013-11-17 MED ORDER — SODIUM CHLORIDE 0.9 % IV SOLN
1000.0000 mL | INTRAVENOUS | Status: DC
  Administered 2013-11-17: 13:00:00 via INTRAVENOUS

## 2013-11-17 MED ORDER — ONDANSETRON 16 MG/50ML IVPB (CHCC)
INTRAVENOUS | Status: AC
  Filled 2013-11-17: qty 16

## 2013-11-17 MED ORDER — SODIUM CHLORIDE 0.9 % IJ SOLN
10.0000 mL | INTRAMUSCULAR | Status: DC | PRN
  Administered 2013-11-17: 10 mL via INTRAVENOUS
  Filled 2013-11-17: qty 10

## 2013-11-17 NOTE — Patient Instructions (Signed)
Dehydration, Adult Dehydration is when you lose more fluids from the body than you take in. Vital organs like the kidneys, brain, and heart cannot function without a proper amount of fluids and salt. Any loss of fluids from the body can cause dehydration.  CAUSES   Vomiting.  Diarrhea.  Excessive sweating.  Excessive urine output.  Fever. SYMPTOMS  Mild dehydration  Thirst.  Dry lips.  Slightly dry mouth. Moderate dehydration  Very dry mouth.  Sunken eyes.  Skin does not bounce back quickly when lightly pinched and released.  Dark urine and decreased urine production.  Decreased tear production.  Headache. Severe dehydration  Very dry mouth.  Extreme thirst.  Rapid, weak pulse (more than 100 beats per minute at rest).  Cold hands and feet.  Not able to sweat in spite of heat and temperature.  Rapid breathing.  Blue lips.  Confusion and lethargy.  Difficulty being awakened.  Minimal urine production.  No tears. DIAGNOSIS  Your caregiver will diagnose dehydration based on your symptoms and your exam. Blood and urine tests will help confirm the diagnosis. The diagnostic evaluation should also identify the cause of dehydration. TREATMENT  Treatment of mild or moderate dehydration can often be done at home by increasing the amount of fluids that you drink. It is best to drink small amounts of fluid more often. Drinking too much at one time can make vomiting worse. Refer to the home care instructions below. Severe dehydration needs to be treated at the hospital where you will probably be given intravenous (IV) fluids that contain water and electrolytes. HOME CARE INSTRUCTIONS   Ask your caregiver about specific rehydration instructions.  Drink enough fluids to keep your urine clear or pale yellow.  Drink small amounts frequently if you have nausea and vomiting.  Eat as you normally do.  Avoid:  Foods or drinks high in sugar.  Carbonated  drinks.  Juice.  Extremely hot or cold fluids.  Drinks with caffeine.  Fatty, greasy foods.  Alcohol.  Tobacco.  Overeating.  Gelatin desserts.  Wash your hands well to avoid spreading bacteria and viruses.  Only take over-the-counter or prescription medicines for pain, discomfort, or fever as directed by your caregiver.  Ask your caregiver if you should continue all prescribed and over-the-counter medicines.  Keep all follow-up appointments with your caregiver. SEEK MEDICAL CARE IF:  You have abdominal pain and it increases or stays in one area (localizes).  You have a rash, stiff neck, or severe headache.  You are irritable, sleepy, or difficult to awaken.  You are weak, dizzy, or extremely thirsty. SEEK IMMEDIATE MEDICAL CARE IF:   You are unable to keep fluids down or you get worse despite treatment.  You have frequent episodes of vomiting or diarrhea.  You have blood or green matter (bile) in your vomit.  You have blood in your stool or your stool looks black and tarry.  You have not urinated in 6 to 8 hours, or you have only urinated a small amount of very dark urine.  You have a fever.  You faint. MAKE SURE YOU:   Understand these instructions.  Will watch your condition.  Will get help right away if you are not doing well or get worse. Document Released: 07/16/2005 Document Revised: 10/08/2011 Document Reviewed: 03/05/2011 Exit[employer] Patient Information 2014 [employer].  

## 2013-11-20 ENCOUNTER — Ambulatory Visit (HOSPITAL_BASED_OUTPATIENT_CLINIC_OR_DEPARTMENT_OTHER): Payer: BC Managed Care – PPO | Admitting: Adult Health

## 2013-11-20 ENCOUNTER — Telehealth: Payer: Self-pay | Admitting: Adult Health

## 2013-11-20 ENCOUNTER — Encounter: Payer: Self-pay | Admitting: Adult Health

## 2013-11-20 ENCOUNTER — Other Ambulatory Visit (HOSPITAL_BASED_OUTPATIENT_CLINIC_OR_DEPARTMENT_OTHER): Payer: BC Managed Care – PPO

## 2013-11-20 VITALS — BP 144/92 | HR 93 | Temp 98.2°F | Resp 18 | Ht 64.0 in | Wt 176.4 lb

## 2013-11-20 DIAGNOSIS — C50519 Malignant neoplasm of lower-outer quadrant of unspecified female breast: Secondary | ICD-10-CM

## 2013-11-20 DIAGNOSIS — G629 Polyneuropathy, unspecified: Secondary | ICD-10-CM

## 2013-11-20 DIAGNOSIS — K5900 Constipation, unspecified: Secondary | ICD-10-CM

## 2013-11-20 DIAGNOSIS — Z170 Estrogen receptor positive status [ER+]: Secondary | ICD-10-CM

## 2013-11-20 DIAGNOSIS — G5790 Unspecified mononeuropathy of unspecified lower limb: Secondary | ICD-10-CM

## 2013-11-20 LAB — CBC WITH DIFFERENTIAL/PLATELET
BASO%: 0.9 % (ref 0.0–2.0)
Basophils Absolute: 0.1 10*3/uL (ref 0.0–0.1)
EOS ABS: 0 10*3/uL (ref 0.0–0.5)
EOS%: 0.2 % (ref 0.0–7.0)
HEMATOCRIT: 32.5 % — AB (ref 34.8–46.6)
HEMOGLOBIN: 11.3 g/dL — AB (ref 11.6–15.9)
LYMPH%: 15.4 % (ref 14.0–49.7)
MCH: 32.7 pg (ref 25.1–34.0)
MCHC: 34.8 g/dL (ref 31.5–36.0)
MCV: 93.9 fL (ref 79.5–101.0)
MONO#: 1.8 10*3/uL — ABNORMAL HIGH (ref 0.1–0.9)
MONO%: 11.7 % (ref 0.0–14.0)
NEUT%: 71.8 % (ref 38.4–76.8)
NEUTROS ABS: 11.1 10*3/uL — AB (ref 1.5–6.5)
PLATELETS: 198 10*3/uL (ref 145–400)
RBC: 3.46 10*6/uL — ABNORMAL LOW (ref 3.70–5.45)
RDW: 15.3 % — ABNORMAL HIGH (ref 11.2–14.5)
WBC: 15.4 10*3/uL — AB (ref 3.9–10.3)
lymph#: 2.4 10*3/uL (ref 0.9–3.3)
nRBC: 1 % — ABNORMAL HIGH (ref 0–0)

## 2013-11-20 LAB — COMPREHENSIVE METABOLIC PANEL (CC13)
ALT: 15 U/L (ref 0–55)
ANION GAP: 12 meq/L — AB (ref 3–11)
AST: 19 U/L (ref 5–34)
Albumin: 3.5 g/dL (ref 3.5–5.0)
Alkaline Phosphatase: 93 U/L (ref 40–150)
BUN: 12.3 mg/dL (ref 7.0–26.0)
CO2: 28 meq/L (ref 22–29)
CREATININE: 0.8 mg/dL (ref 0.6–1.1)
Calcium: 9.4 mg/dL (ref 8.4–10.4)
Chloride: 103 mEq/L (ref 98–109)
Glucose: 108 mg/dl (ref 70–140)
POTASSIUM: 3.6 meq/L (ref 3.5–5.1)
Sodium: 142 mEq/L (ref 136–145)
Total Bilirubin: 0.39 mg/dL (ref 0.20–1.20)
Total Protein: 6.6 g/dL (ref 6.4–8.3)

## 2013-11-20 MED ORDER — GABAPENTIN 100 MG PO CAPS: 100.0000 mg | ORAL_CAPSULE | Freq: Three times a day (TID) | ORAL | Status: DC

## 2013-11-20 NOTE — Patient Instructions (Signed)
Take Colace, one-two (over the counter) tablets twice a day, Senokot (over the counter) 1-2 tablets twice a day, and Miralax daily (over the counter).  Take Claritin 10mg  prior to your Neulasta injection and daily for 5 days afterward.  Take Gabapentin 100mg  (1 tablet) three times a day for three days, then take Gabapentin 200mg  (2 tablets) three times a day for three days, then take Gabapentin 300mg  (3 tablets) three times a day.    Gabapentin capsules or tablets What is this medicine? GABAPENTIN (GA ba pen tin) is used to control partial seizures in adults with epilepsy. It is also used to treat certain types of nerve pain. This medicine may be used for other purposes; ask your health care provider or pharmacist if you have questions. COMMON BRAND NAME(S): Orpha Bur , Neurontin What should I tell my health care provider before I take this medicine? They need to know if you have any of these conditions: -kidney disease -suicidal thoughts, plans, or attempt; a previous suicide attempt by you or a family member -an unusual or allergic reaction to gabapentin, other medicines, foods, dyes, or preservatives -pregnant or trying to get pregnant -breast-feeding How should I use this medicine? Take this medicine by mouth with a glass of water. Follow the directions on the prescription label. You can take it with or without food. If it upsets your stomach, take it with food.Take your medicine at regular intervals. Do not take it more often than directed. Do not stop taking except on your doctor's advice. If you are directed to break the 600 or 800 mg tablets in half as part of your dose, the extra half tablet should be used for the next dose. If you have not used the extra half tablet within 28 days, it should be thrown away. A special MedGuide will be given to you by the pharmacist with each prescription and refill. Be sure to read this information carefully each time. Talk to your pediatrician  regarding the use of this medicine in children. Special care may be needed. Overdosage: If you think you have taken too much of this medicine contact a poison control center or emergency room at once. NOTE: This medicine is only for you. Do not share this medicine with others. What if I miss a dose? If you miss a dose, take it as soon as you can. If it is almost time for your next dose, take only that dose. Do not take double or extra doses. What may interact with this medicine? Do not take this medicine with any of the following medications: -other gabapentin products This medicine may also interact with the following medications: -alcohol -antacids -antihistamines for allergy, cough and cold -certain medicines for anxiety or sleep -certain medicines for depression or psychotic disturbances -homatropine; hydrocodone -naproxen -narcotic medicines (opiates) for pain -phenothiazines like chlorpromazine, mesoridazine, prochlorperazine, thioridazine This list may not describe all possible interactions. Give your health care provider a list of all the medicines, herbs, non-prescription drugs, or dietary supplements you use. Also tell them if you smoke, drink alcohol, or use illegal drugs. Some items may interact with your medicine. What should I watch for while using this medicine? Visit your doctor or health care professional for regular checks on your progress. You may want to keep a record at home of how you feel your condition is responding to treatment. You may want to share this information with your doctor or health care professional at each visit. You should contact your doctor or health  care professional if your seizures get worse or if you have any new types of seizures. Do not stop taking this medicine or any of your seizure medicines unless instructed by your doctor or health care professional. Stopping your medicine suddenly can increase your seizures or their severity. Wear a medical  identification bracelet or chain if you are taking this medicine for seizures, and carry a card that lists all your medications. You may get drowsy, dizzy, or have blurred vision. Do not drive, use machinery, or do anything that needs mental alertness until you know how this medicine affects you. To reduce dizzy or fainting spells, do not sit or stand up quickly, especially if you are an older patient. Alcohol can increase drowsiness and dizziness. Avoid alcoholic drinks. Your mouth may get dry. Chewing sugarless gum or sucking hard candy, and drinking plenty of water will help. The use of this medicine may increase the chance of suicidal thoughts or actions. Pay special attention to how you are responding while on this medicine. Any worsening of mood, or thoughts of suicide or dying should be reported to your health care professional right away. Women who become pregnant while using this medicine may enroll in the [place] Pregnancy Registry by calling [phone]. This registry collects information about the safety of antiepileptic drug use during pregnancy. What side effects may I notice from receiving this medicine? Side effects that you should report to your doctor or health care professional as soon as possible: -allergic reactions like skin rash, itching or hives, swelling of the face, lips, or tongue -worsening of mood, thoughts or actions of suicide or dying Side effects that usually do not require medical attention (report to your doctor or health care professional if they continue or are bothersome): -constipation -difficulty walking or controlling muscle movements -dizziness -nausea -slurred speech -tiredness -tremors -weight gain This list may not describe all possible side effects. Call your doctor for medical advice about side effects. You may report side effects to FDA at 1-800-FDA-1088. Where should I keep my medicine? Keep out of reach of  children. Store at room temperature between 15 and 30 degrees C (59 and 86 degrees F). Throw away any unused medicine after the expiration date. NOTE: This sheet is a summary. It may not cover all possible information. If you have questions about this medicine, talk to your doctor, pharmacist, or health care provider.  2014, [employer]vier/Gold Standard. (2013-03-19 09:12:48)

## 2013-11-20 NOTE — Telephone Encounter (Signed)
, °

## 2013-11-20 NOTE — Progress Notes (Signed)
Hematology and Oncology Follow Up Visit  Tina [name] [id number] 1961-02-05 53 y.o. 11/20/2013 9:24 AM     Principle Diagnosis:Tina [name] 53 y.o. female with clinical stage IIA ER positiive, HER-2/neu positive invasive ductal carcinoma of the right breast.     Prior Therapy: 1. Underwent screening mammogram. On the right side she was noted to have a mass with microcalcifications. Ultrasound in February 2015 showed a 1.2 x 1.1 x 1.0 cm rounded hypoechoic mass with irregular margins in the 8:00 position 4 cm from the nipple. No abnormal axillary lymph nodes were noted. She had MRI of the breasts performed that showed in the lower outer right breast 2.2 x 1.3 x 1.5 cm Irregular mass. In the upper outer left breast there was a 7 x 5 x 8 cm indeterminate enhancing mass. Also another 9 x 7 x 6 mm indeterminant enhancing mass at 9:00 in the left breast was noted.there are also noted to be ominent cortices of right axillary lymph nodes. Patient has undergone biopsy of these. On the right patient had biopsy performed of the 1.2 cm mass. The pathology revealed grade 3 invasive ductal carcinoma with ductal carcinoma in situ. Tumor was ER +70% PR +6% HER-2/neu was amplified and proliferation marker Ki-67 70%   2. Referred to genetic counseling: upcoming appointment is scheduled.   3. Neoadjuvant TCH/Perjeta every 3 weeks x 6 cycles beginning 10/02/13 followed by breast surgery   Current therapy:  Neoadjuvant taxotere, carboplatin, Herceptin, Perjeta cycle 3 day 8  Interim History: Tina [name] 53 y.o. female with clinical stage IIA ER positive, HER-2/neu positive invasive ductal carcinoma of the right breast.  She is currently receiving neoadjuvant chemotherapy with Taxotere, Carboplatin, Herceptin, Perjeta.  She receives Taxotere, carboplatin, herceptin, perjeta on day 1 of a 21 day cycle, and Neulasta on day 2 for granulocyte support.  She is currently day 8 of cycle 3 of 6 planned treatments.  Ms.  Hayter is doing moderately well today.  Her main complaint is constipation.  She is taking Colace 1 tablet once a day.  Her last bowel movement was yesterday, but it was small.  She did experience some significant bone pain following her neulasta injection.  She has not taken any claritin prior to Neulasta or following it.  She has had mild nausea that is improving and relieved with her anti-emetics.  She does feel some numbness in the tips of all of her fingertips that is intermittent in nature.  She also has numbness in the toes through the ball of the sole of her feet.  She has had mild balance changes due to this.  She has not yet taken the super b complex as directed and her neuropathy now is slightly worse.  The first week following treatment she does experience some swelling in her feet and hands, she has had mild increase in dyspnea on exertion, she denies orthopnea, PND, or current swelling in the hands or feet.  She has some mild nail changes with darkening of the nails.  She denies fevers, chills, vomiting, constipation, mouth pain, or any further concerns.    Medications:  Current Outpatient Prescriptions  Medication Sig Dispense Refill  . ascorbic acid (VITAMIN C) 1000 MG tablet Take 1,000 mg by mouth daily.      Marland Kitchen aspirin 81 MG tablet Take 81 mg by mouth daily.      Marland Kitchen BIOTIN 5000 PO Take by mouth daily.       . Calcium Carbonate-Vit D-Min (CALTRATE 600+D PLUS  MINERALS PO) Take 1 tablet by mouth daily.      . Cholecalciferol (VITAMIN D3) 10000 UNITS capsule Take 10,000 Units by mouth daily.      . Coenzyme Q10 (CO Q 10 PO) Take by mouth daily.      . L-ARGININE PO Take by mouth daily.      Marland Kitchen lidocaine-prilocaine (EMLA) cream Apply topically as needed.  30 g  6  . lisinopril-hydrochlorothiazide (PRINZIDE,ZESTORETIC) 20-12.5 MG per tablet Take 1 tablet by mouth 2 (two) times daily.      Marland Kitchen omeprazole (PRILOSEC) 40 MG capsule Take 1 capsule (40 mg total) by mouth daily.  30 capsule  6  .  OVER THE COUNTER MEDICATION Take 1 tablet by mouth daily. Trace Minerals      . Probiotic Product (TRUBIOTICS) CAPS Take 1 capsule by mouth daily.      Marland Kitchen VITAMIN E PO Take 1 capsule by mouth daily.       . Alum & Mag Hydroxide-Simeth (MAGIC MOUTHWASH) SOLN Take 5 mLs by mouth 4 (four) times daily.  100 mL  2  . dexamethasone (DECADRON) 4 MG tablet Take 2 tablets (8 mg total) by mouth 2 (two) times daily. Start the day before Taxotere. Then again the day after chemo for 3 days.  30 tablet  1  . LORazepam (ATIVAN) 0.5 MG tablet Take 1 tablet (0.5 mg total) by mouth every 6 (six) hours as needed (Nausea or vomiting).  30 tablet  0  . nystatin (MYCOSTATIN) 100000 UNIT/ML suspension Take 5 mLs (500,000 Units total) by mouth 4 (four) times daily.  60 mL  0  . ondansetron (ZOFRAN) 8 MG tablet Take 1 tablet (8 mg total) by mouth 2 (two) times daily. Start the day after chemo for 3 days. Then take as needed for nausea or vomiting.  30 tablet  1  . oxyCODONE (OXY IR/ROXICODONE) 5 MG immediate release tablet Take 1 tablet (5 mg total) by mouth every 4 (four) hours as needed for severe pain.  60 tablet  0  . potassium chloride SA (K-DUR,KLOR-CON) 20 MEQ tablet Take 1 tablet (20 mEq total) by mouth once. X 3 days, then stop  5 tablet  0  . prochlorperazine (COMPAZINE) 10 MG tablet Take 1 tablet (10 mg total) by mouth every 6 (six) hours as needed (Nausea or vomiting).  30 tablet  1   No current facility-administered medications for this visit.     Allergies:  Allergies  Allergen Reactions  . Adhesive [Tape]   . Nickel     Medical History: Past Medical History  Diagnosis Date  . Leukocytopenia   . Hypertension   . Degenerative joint disease   . Leukopenia 06/16/2013  . Needs flu shot 06/17/2013  . Cancer of right breast 09/03/13    Invasive Ductal Carcinoma/Ductal Carcinoma Insitu    Surgical History:  Past Surgical History  Procedure Laterality Date  . Cesarean section    . Carpal tunnel  release    . Trigger finger release       Review of Systems: A 10 point review of systems was conducted and is otherwise negative except for what is noted above.     Physical Exam: Blood pressure 144/92, pulse 93, temperature 98.2 F (36.8 C), temperature source Oral, resp. rate 18, height '5\' 4"'  (1.626 m), weight 176 lb 6.4 oz (80.015 kg). GENERAL: Patient is a well appearing female in no acute distress HEENT:  Sclerae anicteric.  Oropharynx clear and moist. No  ulcerations or evidence of oropharyngeal candidiasis. Neck is supple.  NODES:  No cervical, supraclavicular, or axillary lymphadenopathy palpated.  BREAST EXAM:  Right  Breast mass is smaller and softer LUNGS:  Clear to auscultation bilaterally.  No wheezes or rhonchi. HEART:  Regular rate and rhythm. No murmur appreciated. ABDOMEN:  Soft, nontender.  Positive, normoactive bowel sounds. No organomegaly palpated. MSK:  No focal spinal tenderness to palpation. Full range of motion bilaterally in the upper extremities. EXTREMITIES:  No peripheral edema.   SKIN:  Clear with no obvious rashes or skin changes. Nail beds are hyperpigmented NEURO:  Nonfocal. Well oriented.  Appropriate affect. ECOG PERFORMANCE STATUS: 1 - Symptomatic but completely ambulatory   Lab Results: Lab Results  Component Value Date   WBC 15.4* 11/20/2013   HGB 11.3* 11/20/2013   HCT 32.5* 11/20/2013   MCV 93.9 11/20/2013   PLT 198 11/20/2013     Chemistry      Component Value Date/Time   NA 142 11/20/2013 0820   NA 138 09/25/2013 1320   K 3.6 11/20/2013 0820   K 3.6* 09/25/2013 1320   CL 98 09/25/2013 1320   CL 104 06/16/2012 1303   CO2 28 11/20/2013 0820   CO2 27 09/25/2013 1320   BUN 12.3 11/20/2013 0820   BUN 9 09/25/2013 1320   CREATININE 0.8 11/20/2013 0820   CREATININE 0.82 09/25/2013 1320      Component Value Date/Time   CALCIUM 9.4 11/20/2013 0820   CALCIUM 10.0 09/25/2013 1320   ALKPHOS 93 11/20/2013 0820   ALKPHOS 42 10/04/2012 1830   AST 19  11/20/2013 0820   AST 32 10/04/2012 1830   ALT 15 11/20/2013 0820   ALT 27 10/04/2012 1830   BILITOT 0.39 11/20/2013 0820   BILITOT 0.7 10/04/2012 1830     Assessment and Plan: [name] 53 y.o. female with  1. Clinical stage IIA ER positive, HER-2/neu positive invasive ductal carcinoma of the right breast.  The patient is currently day 8 of cycle 3 of 6 planned treatments with Taxotere, Carboplatin, Herceptin, and Perjeta.  Her labs are stable following treatment.  I reviewed them with her in detail.  I ordered an MRI of her breasts to be scheduled at the end of treatment to evaluate her response.    2. Cardiac: Her last echocardiogram on 09/29/2013 demonstrated a LVEF of 65-70%. She was evaluated by Dr. Vaughan Browner on 10/19/13 in the cardio-onc clinic. He reviewed her echo, and cleared her to continue with Herceptin. 3 month f/u with echocardiogram was recommended.  Due to the swelling and shortness of breath following treatment, I have referred her back for an appointment with cardiology to evaluation her symptoms.    3. Constipation: The patient is taking Colace one tablet daily.  I recommended she add senokot 1-2 tablets BID and Miralax daily.  She is passing gas and her last bowel movement though small was yesterday.    4. Nausea:  This is resolved with her current anti-emetics.  She has no nausea, vomiting, abdominal pain, or signs of obstruction on physical exam.   5. Numbness:  I am concerned about the degree of numbness in her feet.  I recommended she pick up the Super B complex.  I will also prescribe Gabapentin TID for her to take.  I gave her detailed information about this in her AVS.    6. Bone pain after Neulasta:  I recommended the patient take Clariting 46m daily beginning the morning after treatment, and daily  x 5 days.    The patient will return in 2 weeks for labs, evaluation, and cycle 4 of Taxotere, Carboplatin, herceptin, and Perjeta.   She knows to call us in the interim for  any questions or concerns.  We can certainly see her sooner if needed.  I spent 25 minutes counseling the patient face to face.  The total time spent in the appointment was 30 minutes.  Minette Headland, [place] [phone] 11/20/2013 9:24 AM

## 2013-11-25 ENCOUNTER — Encounter: Payer: Self-pay | Admitting: Genetic Counselor

## 2013-11-25 DIAGNOSIS — C50519 Malignant neoplasm of lower-outer quadrant of unspecified female breast: Secondary | ICD-10-CM

## 2013-11-25 NOTE — Progress Notes (Signed)
HPI:  Tina [name] was previously seen in the [place] clinic due to a personal history of breast cancer and concerns regarding a hereditary predisposition to cancer. Please refer to our prior cancer genetics clinic note for more information regarding Tina [name]'s medical, social and family histories, and our assessment and recommendations, at the time. Tina [name] recent genetic test results were disclosed to her, as were recommendations warranted by these results. These results and recommendations are discussed in more detail below.  GENETIC TEST RESULTS: At the time of Tina [name] visit, we recommended she pursue genetic testing of the BRCA1 and BRCA2 genes. This test, which included sequencing and deletion/duplication analysis of these genes, was performed at [facility]. Genetic testing was normal, and did not reveal a mutation in these genes.   We discussed with Tina [name] that since the current genetic testing is not perfect, it is possible there may be a gene mutation in one of these genes that current testing cannot detect, but that chance is small.  We also discussed, that it is possible that another gene that has not yet been discovered, or that we have not yet tested, is responsible for the cancer diagnoses in the family, and it is, therefore, important to remain in touch with cancer genetics in the future so that we can continue to offer Tina [name] the most up to date genetic testing.   ADDITIONAL GENETIC TESTING: We discussed with Tina [name] that there are other genes that are associated with increased cancer risk that can be analyzed. The laboratories that offer such testing look at these additional genes via a hereditary cancer gene panel. Should Tina [name] wish to pursue such testing, we are happy to discuss and coordinate this testing at any time.    CANCER SCREENING RECOMMENDATIONS: This result is reassuring and suggests that Tina [name]'s  cancer was most likely not due to an inherited predisposition associated with one of these genes.  Most cancers happen by chance and this negative test, along with details of her family history, suggests that her cancer falls into this category.  We, therefore, recommended she continue to follow the cancer management and screening guidelines provided by her oncology and primary providers.   RECOMMENDATIONS FOR FAMILY MEMBERS:  Women in this family might be at some increased risk of developing cancer, over the general population risk, simply due to the family history of cancer.  We recommended women in this family have a yearly mammogram beginning at age 31, an an annual clinical breast exam, and perform monthly breast self-exams. Women in this family should also have a gynecological exam as recommended by their primary provider. All family members should have a colonoscopy by age 71.  FOLLOW-UP: Lastly, we discussed with Tina [name] that cancer genetics is a rapidly advancing field and it is possible that new genetic tests will be appropriate for her and/or her family members in the future. We encouraged her to remain in contact with cancer genetics on an annual basis so we can update her personal and family histories and let her know of advances in cancer genetics that may benefit this family.   Our contact number was provided. Tina [name] questions were answered to her satisfaction, and she knows she is welcome to call us at anytime with additional questions or concerns. This patient was discussed with Dr. Humphrey Rolls who agrees with the above.   Tina A. Fine, MS, CGC Certified Genetic Counseor phone: [phone] [email or web address]

## 2013-11-27 ENCOUNTER — Encounter: Payer: Self-pay | Admitting: Oncology

## 2013-11-27 NOTE — Progress Notes (Signed)
Per [employer] patient approved for asst with Herceptin [id number]  24,000  11/03/13-11/02/14 patient [id number]. I will send to billing.

## 2013-11-30 ENCOUNTER — Ambulatory Visit (HOSPITAL_BASED_OUTPATIENT_CLINIC_OR_DEPARTMENT_OTHER)
Admission: RE | Admit: 2013-11-30 | Discharge: 2013-11-30 | Disposition: A | Discharge status: Home / Self Care | Payer: BC Managed Care – PPO | Source: Ambulatory Visit | Attending: Family Medicine | Admitting: Family Medicine

## 2013-11-30 ENCOUNTER — Encounter (HOSPITAL_COMMUNITY): Payer: Self-pay

## 2013-11-30 ENCOUNTER — Ambulatory Visit (HOSPITAL_COMMUNITY)
Admission: RE | Admit: 2013-11-30 | Discharge: 2013-11-30 | Disposition: A | Discharge status: Home / Self Care | Payer: BC Managed Care – PPO | Source: Ambulatory Visit | Attending: Family Medicine | Admitting: Family Medicine

## 2013-11-30 VITALS — BP 102/7 | HR 99 | Wt 183.0 lb

## 2013-11-30 DIAGNOSIS — I10 Essential (primary) hypertension: Secondary | ICD-10-CM | POA: Insufficient documentation

## 2013-11-30 DIAGNOSIS — C50519 Malignant neoplasm of lower-outer quadrant of unspecified female breast: Secondary | ICD-10-CM

## 2013-11-30 DIAGNOSIS — M1990 Unspecified osteoarthritis, unspecified site: Secondary | ICD-10-CM | POA: Insufficient documentation

## 2013-11-30 DIAGNOSIS — C50919 Malignant neoplasm of unspecified site of unspecified female breast: Secondary | ICD-10-CM | POA: Insufficient documentation

## 2013-11-30 DIAGNOSIS — Z87891 Personal history of nicotine dependence: Secondary | ICD-10-CM | POA: Insufficient documentation

## 2013-11-30 DIAGNOSIS — Z9221 Personal history of antineoplastic chemotherapy: Secondary | ICD-10-CM | POA: Insufficient documentation

## 2013-11-30 NOTE — Progress Notes (Signed)
  Echocardiogram 2D Echocardiogram has been performed.  Tina [name] 11/30/2013, 3:15 PM

## 2013-11-30 NOTE — Patient Instructions (Signed)
We will contact you in 3 months to schedule your next appointment and echocardiogram  

## 2013-12-01 NOTE — Progress Notes (Signed)
Patient ID: Tina [name], female   DOB: Jan 14, 1961, 53 y.o.   MRN: [id number]  HPI:  Tina [name] is a 53 y/o woman with HTN, DJD and R breast cancer referred by Dr. Humphrey [name] for enrollment into the cardio-oncology clinic.   Denies any h/o known cardiac issues.  Found to have R breast CA. Stage II(T2 NX) invasive ductal carcinoma with DCIS of the right breast. Biopsy revealed tumor to be ER+PR+ HER-2/neu positive with proliferation marker Ki-67 70%   Started neoadjuvant chemotherapy on 10/06/13 with TCH/P.  Doing well overall.  The weak after chemo she has mild exertional dyspnea but this resolves.  She has some tingling in her hands and feet and has started Neurontin.   Echo (3/15): EF 65% Lateral s' 12.9 cm/s GLS not measured  Echo (5/15): EF 60-65%, lateral S' 12.9 cm/sec, GLS -19.9%.   Review of Systems: All systems reviewed and negative except as per HPI.   Past Medical History  Diagnosis Date  . Leukocytopenia   . Hypertension   . Degenerative joint disease   . Leukopenia 06/16/2013  . Needs flu shot 06/17/2013  . Cancer of right breast 09/03/13    Invasive Ductal Carcinoma/Ductal Carcinoma Insitu    Current Outpatient Prescriptions  Medication Sig Dispense Refill  . Alum & Mag Hydroxide-Simeth (MAGIC MOUTHWASH) SOLN Take 5 mLs by mouth 4 (four) times daily.  100 mL  2  . ascorbic acid (VITAMIN C) 1000 MG tablet Take 1,000 mg by mouth daily.      Marland Kitchen aspirin 81 MG tablet Take 81 mg by mouth daily.      Marland Kitchen BIOTIN 5000 PO Take by mouth daily.       . Calcium Carbonate-Vit D-Min (CALTRATE 600+D PLUS MINERALS PO) Take 1 tablet by mouth daily.      . Cholecalciferol (VITAMIN D3) 10000 UNITS capsule Take 10,000 Units by mouth daily.      . Coenzyme Q10 (CO Q 10 PO) Take by mouth daily.      Marland Kitchen dexamethasone (DECADRON) 4 MG tablet Take 2 tablets (8 mg total) by mouth 2 (two) times daily. Start the day before Taxotere. Then again the day after chemo for 3 days.  30 tablet  1  . gabapentin  (NEURONTIN) 100 MG capsule Take 1 capsule (100 mg total) by mouth 3 (three) times daily.  270 capsule  3  . L-ARGININE PO Take by mouth daily.      Marland Kitchen lidocaine-prilocaine (EMLA) cream Apply topically as needed.  30 g  6  . lisinopril-hydrochlorothiazide (PRINZIDE,ZESTORETIC) 20-12.5 MG per tablet Take 1 tablet by mouth daily.       Marland Kitchen LORazepam (ATIVAN) 0.5 MG tablet Take 1 tablet (0.5 mg total) by mouth every 6 (six) hours as needed (Nausea or vomiting).  30 tablet  0  . nystatin (MYCOSTATIN) 100000 UNIT/ML suspension Take 5 mLs (500,000 Units total) by mouth 4 (four) times daily.  60 mL  0  . omeprazole (PRILOSEC) 40 MG capsule Take 1 capsule (40 mg total) by mouth daily.  30 capsule  6  . ondansetron (ZOFRAN) 8 MG tablet Take 1 tablet (8 mg total) by mouth 2 (two) times daily. Start the day after chemo for 3 days. Then take as needed for nausea or vomiting.  30 tablet  1  . OVER THE COUNTER MEDICATION Take 1 tablet by mouth daily. Trace Minerals      . oxyCODONE (OXY IR/ROXICODONE) 5 MG immediate release tablet Take 1 tablet (5 mg total)  by mouth every 4 (four) hours as needed for severe pain.  60 tablet  0  . potassium chloride SA (K-DUR,KLOR-CON) 20 MEQ tablet Take 1 tablet (20 mEq total) by mouth once. X 3 days, then stop  5 tablet  0  . Probiotic Product (TRUBIOTICS) CAPS Take 1 capsule by mouth daily.      . prochlorperazine (COMPAZINE) 10 MG tablet Take 1 tablet (10 mg total) by mouth every 6 (six) hours as needed (Nausea or vomiting).  30 tablet  1  . VITAMIN E PO Take 1 capsule by mouth daily.        No current facility-administered medications for this encounter.    Allergies  Allergen Reactions  . Adhesive [Tape]   . Nickel     History   Social History  . Marital Status: Married    Spouse Name: N/A    Number of Children: 1  . Years of Education: N/A   Occupational History  .      homemaker   Social History Main Topics  . Smoking status: Former Smoker -- 1.00 packs/day  for 15 years    Types: Cigars, Cigarettes  . Smokeless tobacco: Never Used     Comment: remote smoking history  . Alcohol Use: 0.0 oz/week    0 Shots of liquor per week     Comment: occ   . Drug Use: Yes     Comment: Marjuana  . Sexual Activity: Yes   Other Topics Concern  . 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      Home Situation: lives with husband and 5 yo son      Spiritual Beliefs: Christian      Lifestyle: elliptical 3-4 times per week; working on diet as well over last year in 2014             Family History  Problem Relation Age of Onset  . Hypertension Mother   . Dementia Mother     small vessel disease  . Cancer Mother 30    cervical  cancer  . Cancer Father 55    prostate ca  . Diabetes Father   . Arthritis Father     PHYSICAL EXAM: Filed Vitals:   11/30/13 1525  BP: 102/7  Pulse: 99  Weight: 183 lb (83.008 kg)  SpO2: 95%    General:  Well appearing. No respiratory difficulty HEENT: normal Neck: supple. no JVD. Carotids 2+ bilat; no bruits. No lymphadenopathy or thryomegaly appreciated. Cor: PMI nondisplaced. Regular rate & rhythm. No rubs, gallops or murmurs. Lungs: clear Abdomen: soft, nontender, nondistended. No hepatosplenomegaly. No bruits or masses. Good bowel sounds. Extremities: no cyanosis, clubbing, rash, edema Neuro: alert & oriented x 3, cranial nerves grossly intact. moves all 4 extremities w/o difficulty. Affect pleasant.  ASSESSMENT & PLAN: 1. Breast CA, right.  I reviewed today's echo.  EF, lateral S', and global longitudinal strain were all stable.  She may continue Herceptin with repeat echo in 3 months with clinic followup.  2. HTN - well controlled  Tina [name] 12/01/2013

## 2013-12-04 ENCOUNTER — Telehealth: Payer: Self-pay | Admitting: Adult Health

## 2013-12-04 ENCOUNTER — Other Ambulatory Visit (HOSPITAL_BASED_OUTPATIENT_CLINIC_OR_DEPARTMENT_OTHER): Payer: BC Managed Care – PPO

## 2013-12-04 ENCOUNTER — Other Ambulatory Visit: Payer: Self-pay | Admitting: Oncology

## 2013-12-04 ENCOUNTER — Ambulatory Visit (HOSPITAL_BASED_OUTPATIENT_CLINIC_OR_DEPARTMENT_OTHER): Payer: BC Managed Care – PPO | Admitting: Adult Health

## 2013-12-04 ENCOUNTER — Ambulatory Visit (HOSPITAL_BASED_OUTPATIENT_CLINIC_OR_DEPARTMENT_OTHER): Payer: BC Managed Care – PPO

## 2013-12-04 ENCOUNTER — Encounter: Payer: Self-pay | Admitting: Adult Health

## 2013-12-04 ENCOUNTER — Telehealth: Payer: Self-pay | Admitting: *Deleted

## 2013-12-04 VITALS — BP 140/80

## 2013-12-04 VITALS — BP 162/100 | HR 105 | Temp 98.3°F | Resp 20 | Ht 64.0 in | Wt 183.2 lb

## 2013-12-04 DIAGNOSIS — E860 Dehydration: Secondary | ICD-10-CM

## 2013-12-04 DIAGNOSIS — Z5111 Encounter for antineoplastic chemotherapy: Secondary | ICD-10-CM

## 2013-12-04 DIAGNOSIS — R110 Nausea: Secondary | ICD-10-CM

## 2013-12-04 DIAGNOSIS — C50519 Malignant neoplasm of lower-outer quadrant of unspecified female breast: Secondary | ICD-10-CM

## 2013-12-04 DIAGNOSIS — Z5112 Encounter for antineoplastic immunotherapy: Secondary | ICD-10-CM

## 2013-12-04 DIAGNOSIS — I10 Essential (primary) hypertension: Secondary | ICD-10-CM

## 2013-12-04 DIAGNOSIS — G609 Hereditary and idiopathic neuropathy, unspecified: Secondary | ICD-10-CM

## 2013-12-04 LAB — COMPREHENSIVE METABOLIC PANEL (CC13)
ALBUMIN: 3.7 g/dL (ref 3.5–5.0)
ALT: 30 U/L (ref 0–55)
ANION GAP: 12 meq/L — AB (ref 3–11)
AST: 22 U/L (ref 5–34)
Alkaline Phosphatase: 56 U/L (ref 40–150)
BUN: 12.8 mg/dL (ref 7.0–26.0)
CALCIUM: 9.9 mg/dL (ref 8.4–10.4)
CHLORIDE: 105 meq/L (ref 98–109)
CO2: 24 meq/L (ref 22–29)
Creatinine: 0.9 mg/dL (ref 0.6–1.1)
Glucose: 180 mg/dl — ABNORMAL HIGH (ref 70–140)
POTASSIUM: 3.3 meq/L — AB (ref 3.5–5.1)
SODIUM: 141 meq/L (ref 136–145)
TOTAL PROTEIN: 7.2 g/dL (ref 6.4–8.3)
Total Bilirubin: 0.75 mg/dL (ref 0.20–1.20)

## 2013-12-04 LAB — CBC WITH DIFFERENTIAL/PLATELET
BASO%: 0 % (ref 0.0–2.0)
Basophils Absolute: 0 10*3/uL (ref 0.0–0.1)
EOS%: 0 % (ref 0.0–7.0)
Eosinophils Absolute: 0 10*3/uL (ref 0.0–0.5)
HEMATOCRIT: 31.4 % — AB (ref 34.8–46.6)
HGB: 10.6 g/dL — ABNORMAL LOW (ref 11.6–15.9)
LYMPH#: 0.4 10*3/uL — AB (ref 0.9–3.3)
LYMPH%: 12.1 % — ABNORMAL LOW (ref 14.0–49.7)
MCH: 32.8 pg (ref 25.1–34.0)
MCHC: 33.8 g/dL (ref 31.5–36.0)
MCV: 97.2 fL (ref 79.5–101.0)
MONO#: 0.2 10*3/uL (ref 0.1–0.9)
MONO%: 6.6 % (ref 0.0–14.0)
NEUT#: 2.8 10*3/uL (ref 1.5–6.5)
NEUT%: 81.3 % — ABNORMAL HIGH (ref 38.4–76.8)
Platelets: 226 10*3/uL (ref 145–400)
RBC: 3.23 10*6/uL — AB (ref 3.70–5.45)
RDW: 15.8 % — AB (ref 11.2–14.5)
WBC: 3.5 10*3/uL — AB (ref 3.9–10.3)

## 2013-12-04 MED ORDER — LORAZEPAM 2 MG/ML IJ SOLN
INTRAMUSCULAR | Status: AC
  Filled 2013-12-04: qty 1

## 2013-12-04 MED ORDER — DIPHENHYDRAMINE HCL 25 MG PO CAPS
ORAL_CAPSULE | ORAL | Status: AC
  Filled 2013-12-04: qty 2

## 2013-12-04 MED ORDER — PALONOSETRON HCL INJECTION 0.25 MG/5ML
0.2500 mg | Freq: Once | INTRAVENOUS | Status: AC
  Administered 2013-12-04: 0.25 mg via INTRAVENOUS

## 2013-12-04 MED ORDER — ACETAMINOPHEN 325 MG PO TABS
650.0000 mg | ORAL_TABLET | Freq: Once | ORAL | Status: AC
  Administered 2013-12-04: 650 mg via ORAL

## 2013-12-04 MED ORDER — SODIUM CHLORIDE 0.9 % IV SOLN
420.0000 mg | Freq: Once | INTRAVENOUS | Status: AC
  Administered 2013-12-04: 420 mg via INTRAVENOUS
  Filled 2013-12-04: qty 14

## 2013-12-04 MED ORDER — SODIUM CHLORIDE 0.9 % IV SOLN: INTRAVENOUS | Status: DC

## 2013-12-04 MED ORDER — DEXAMETHASONE SODIUM PHOSPHATE 20 MG/5ML IJ SOLN
12.0000 mg | Freq: Once | INTRAMUSCULAR | Status: AC
  Administered 2013-12-04: 12 mg via INTRAVENOUS

## 2013-12-04 MED ORDER — OXYCODONE HCL 5 MG PO TABS: 5.0000 mg | ORAL_TABLET | ORAL | Status: DC | PRN

## 2013-12-04 MED ORDER — LORAZEPAM 2 MG/ML IJ SOLN
0.5000 mg | Freq: Once | INTRAMUSCULAR | Status: AC
  Administered 2013-12-04: 0.5 mg via INTRAVENOUS

## 2013-12-04 MED ORDER — ONDANSETRON 16 MG/50ML IVPB (CHCC): 16.0000 mg | Freq: Once | INTRAVENOUS | Status: DC

## 2013-12-04 MED ORDER — DOCETAXEL CHEMO INJECTION 160 MG/16ML
75.0000 mg/m2 | Freq: Once | INTRAVENOUS | Status: AC
  Administered 2013-12-04: 140 mg via INTRAVENOUS
  Filled 2013-12-04: qty 14

## 2013-12-04 MED ORDER — SODIUM CHLORIDE 0.9 % IV SOLN
150.0000 mg | Freq: Once | INTRAVENOUS | Status: AC
  Administered 2013-12-04: 150 mg via INTRAVENOUS
  Filled 2013-12-04: qty 5

## 2013-12-04 MED ORDER — DEXAMETHASONE SODIUM PHOSPHATE 20 MG/5ML IJ SOLN
INTRAMUSCULAR | Status: AC
  Filled 2013-12-04: qty 5

## 2013-12-04 MED ORDER — LORAZEPAM 0.5 MG PO TABS: 0.5000 mg | ORAL_TABLET | Freq: Four times a day (QID) | ORAL | Status: DC | PRN

## 2013-12-04 MED ORDER — PALONOSETRON HCL INJECTION 0.25 MG/5ML
INTRAVENOUS | Status: AC
  Filled 2013-12-04: qty 5

## 2013-12-04 MED ORDER — ACETAMINOPHEN 325 MG PO TABS
ORAL_TABLET | ORAL | Status: AC
  Filled 2013-12-04: qty 2

## 2013-12-04 MED ORDER — DIPHENHYDRAMINE HCL 25 MG PO CAPS
50.0000 mg | ORAL_CAPSULE | Freq: Once | ORAL | Status: AC
  Administered 2013-12-04: 50 mg via ORAL

## 2013-12-04 MED ORDER — SODIUM CHLORIDE 0.9 % IV SOLN
610.0000 mg | Freq: Once | INTRAVENOUS | Status: AC
  Administered 2013-12-04: 610 mg via INTRAVENOUS
  Filled 2013-12-04: qty 61

## 2013-12-04 MED ORDER — SODIUM CHLORIDE 0.9 % IV SOLN
Freq: Once | INTRAVENOUS | Status: AC
  Administered 2013-12-04: 10:00:00 via INTRAVENOUS

## 2013-12-04 MED ORDER — SODIUM CHLORIDE 0.9 % IV SOLN: 697.8000 mg | Freq: Once | INTRAVENOUS | Status: DC

## 2013-12-04 MED ORDER — TRASTUZUMAB CHEMO INJECTION 440 MG
6.0000 mg/kg | Freq: Once | INTRAVENOUS | Status: AC
  Administered 2013-12-04: 462 mg via INTRAVENOUS
  Filled 2013-12-04: qty 22

## 2013-12-04 MED ORDER — PROCHLORPERAZINE MALEATE 10 MG PO TABS: 10.0000 mg | ORAL_TABLET | Freq: Four times a day (QID) | ORAL | Status: DC | PRN

## 2013-12-04 MED ORDER — HEPARIN SOD (PORK) LOCK FLUSH 100 UNIT/ML IV SOLN
500.0000 [IU] | Freq: Once | INTRAVENOUS | Status: AC | PRN
  Administered 2013-12-04: 500 [IU]
  Filled 2013-12-04: qty 5

## 2013-12-04 MED ORDER — SODIUM CHLORIDE 0.9 % IJ SOLN
10.0000 mL | INTRAMUSCULAR | Status: DC | PRN
  Administered 2013-12-04: 10 mL
  Filled 2013-12-04: qty 10

## 2013-12-04 NOTE — Patient Instructions (Signed)
[facility] Discharge Instructions for Patients Receiving Chemotherapy  Today you received the following chemotherapy agents: Herceptin, Perjeta, Taxotere, Carboplatin  To help prevent nausea and vomiting after your treatment, we encourage you to take your nausea medication: Compazine 10 mg every 6 hrs as needed; Zofran 8 mg as directed: Take 1 tablet (8 mg total) by mouth 2 (two) times daily. Start the day after chemo for 3 days. Then take as needed for nausea or vomiting    If you develop nausea and vomiting that is not controlled by your nausea medication, call the clinic.   BELOW ARE SYMPTOMS THAT SHOULD BE REPORTED IMMEDIATELY:  *FEVER GREATER THAN 100.5 F  *CHILLS WITH OR WITHOUT FEVER  NAUSEA AND VOMITING THAT IS NOT CONTROLLED WITH YOUR NAUSEA MEDICATION  *UNUSUAL SHORTNESS OF BREATH  *UNUSUAL BRUISING OR BLEEDING  TENDERNESS IN MOUTH AND THROAT WITH OR WITHOUT PRESENCE OF ULCERS  *URINARY PROBLEMS  *BOWEL PROBLEMS  UNUSUAL RASH Items with * indicate a potential emergency and should be followed up as soon as possible.  Feel free to call the clinic you have any questions or concerns. The clinic phone number is [phone].

## 2013-12-04 NOTE — Progress Notes (Signed)
Hematology and Oncology Follow Up Visit  Tina [name] [id number] 06-20-1961 53 y.o. 12/06/2013 6:16 AM     Principal Diagnosis:Tina [name] 53 y.o. [name], [place] woman with clinical stage T2Nx IIA invasive ductal carcinoma, grade 3, ER positive, HER-2/neu positive invasive ductal carcinoma of the right breast.     Prior Therapy: 1. Underwent screening mammogram. On the right side she was noted to have a mass with microcalcifications. Ultrasound in February 2015 showed a 1.2 x 1.1 x 1.0 cm rounded hypoechoic mass with irregular margins in the 8:00 position 4 cm from the nipple. No abnormal axillary lymph nodes were noted. She had MRI of the breasts performed that showed in the lower outer right breast 2.2 x 1.3 x 1.5 cm Irregular mass. In the upper outer left breast there was a 7 x 5 x 8 mm indeterminate enhancing mass. Also another 9 x 7 x 6 mm indeterminant enhancing mass at 9:00 in the left breast was noted.there are also noted to be ominent cortices of right axillary lymph nodes. Patient has undergone biopsy of these. On the right patient had biopsy performed of the 1.2 cm mass. The pathology revealed grade 3 invasive ductal carcinoma with ductal carcinoma in situ. Tumor was ER +70% PR +6% HER-2/neu was amplified and proliferation marker Ki-67 70%   2. Referred to genetic counseling: testing was negative.   3. Neoadjuvant TCH/Perjeta every 3 weeks x 6 cycles beginning 10/02/13 followed by breast surgery   Current therapy:  Neoadjuvant taxotere, carboplatin, Herceptin, Perjeta cycle 4 day 1  Interim History: Tina [name] 53 y.o. female with clinical stage IIA ER positive, HER-2/neu positive invasive ductal carcinoma of the right breast.  She is currently receiving neoadjuvant chemotherapy with Taxotere, Carboplatin, Herceptin, Perjeta.  She receives Taxotere, carboplatin, herceptin, perjeta on day 1 of a 21 day cycle, and Neulasta on day 2 for granulocyte support.  She is currently day 1 of  cycle 4 of 6 planned treatments.  Tina [name] is doing well today.  She has been taking Gabapentin for neuropathy and it has much improved.  It is now intermittent and continues to not interfere with her activities of daily living.  She is fatigued.  She denies fevers, chills, nausea, vomiting, constipation, diarrhea, pain, nail changes, or any further concerns.    Medications:  Current Outpatient Prescriptions  Medication Sig Dispense Refill  . ascorbic acid (VITAMIN C) 1000 MG tablet Take 1,000 mg by mouth daily.      Marland Kitchen aspirin 81 MG tablet Take 81 mg by mouth daily.      Marland Kitchen BIOTIN 5000 PO Take by mouth daily.       . Calcium Carbonate-Vit D-Min (CALTRATE 600+D PLUS MINERALS PO) Take 1 tablet by mouth daily.      . Cholecalciferol (VITAMIN D3) 10000 UNITS capsule Take 10,000 Units by mouth daily.      . Coenzyme Q10 (CO Q 10 PO) Take by mouth daily.      Marland Kitchen dexamethasone (DECADRON) 4 MG tablet Take 2 tablets (8 mg total) by mouth 2 (two) times daily. Start the day before Taxotere. Then again the day after chemo for 3 days.  30 tablet  1  . gabapentin (NEURONTIN) 100 MG capsule Take 1 capsule (100 mg total) by mouth 3 (three) times daily.  270 capsule  3  . L-ARGININE PO Take by mouth daily.      Marland Kitchen lidocaine-prilocaine (EMLA) cream Apply topically as needed.  30 g  6  . lisinopril-hydrochlorothiazide (PRINZIDE,ZESTORETIC)  20-12.5 MG per tablet Take 1 tablet by mouth daily.       Marland Kitchen omeprazole (PRILOSEC) 40 MG capsule Take 1 capsule (40 mg total) by mouth daily.  30 capsule  6  . OVER THE COUNTER MEDICATION Take 1 tablet by mouth daily. Trace Minerals      . oxyCODONE (OXY IR/ROXICODONE) 5 MG immediate release tablet Take 1 tablet (5 mg total) by mouth every 4 (four) hours as needed for severe pain.  60 tablet  0  . VITAMIN E PO Take 1 capsule by mouth daily.       . Alum & Mag Hydroxide-Simeth (MAGIC MOUTHWASH) SOLN Take 5 mLs by mouth 4 (four) times daily.  100 mL  2  . LORazepam (ATIVAN) 0.5  MG tablet Take 1 tablet (0.5 mg total) by mouth every 6 (six) hours as needed (Nausea or vomiting).  30 tablet  0  . nystatin (MYCOSTATIN) 100000 UNIT/ML suspension Take 5 mLs (500,000 Units total) by mouth 4 (four) times daily.  60 mL  0  . ondansetron (ZOFRAN) 8 MG tablet Take 1 tablet (8 mg total) by mouth 2 (two) times daily. Start the day after chemo for 3 days. Then take as needed for nausea or vomiting.  30 tablet  1  . potassium chloride SA (K-DUR,KLOR-CON) 20 MEQ tablet Take 1 tablet (20 mEq total) by mouth once. X 3 days, then stop  5 tablet  0  . Probiotic Product (TRUBIOTICS) CAPS Take 1 capsule by mouth daily.      . prochlorperazine (COMPAZINE) 10 MG tablet Take 1 tablet (10 mg total) by mouth every 6 (six) hours as needed (Nausea or vomiting).  30 tablet  1   No current facility-administered medications for this visit.     Allergies:  Allergies  Allergen Reactions  . Adhesive [Tape]   . Nickel     Medical History: Past Medical History  Diagnosis Date  . Leukocytopenia   . Hypertension   . Degenerative joint disease   . Leukopenia 06/16/2013  . Needs flu shot 06/17/2013  . Cancer of right breast 09/03/13    Invasive Ductal Carcinoma/Ductal Carcinoma Insitu    Surgical History:  Past Surgical History  Procedure Laterality Date  . Cesarean section    . Carpal tunnel release    . Trigger finger release       Review of Systems: A 10 point review of systems was conducted and is otherwise negative except for what is noted above.     Physical Exam: Blood pressure 162/100, pulse 105, temperature 98.3 F (36.8 C), temperature source Oral, resp. rate 20, height _0  (1.626 m), weight 183 lb 3.2 oz (83.099 kg). GENERAL: Patient is a well appearing female in no acute distress HEENT:  Sclerae anicteric.  Oropharynx clear and moist. No ulcerations or evidence of oropharyngeal candidiasis. Neck is supple.  NODES:  No cervical, supraclavicular, or axillary lymphadenopathy  palpated.  BREAST EXAM:  Right  Breast mass is smaller and softer LUNGS:  Clear to auscultation bilaterally.  No wheezes or rhonchi. HEART:  Regular rate and rhythm. No murmur appreciated. ABDOMEN:  Soft, nontender.  Positive, normoactive bowel sounds. No organomegaly palpated. MSK:  No focal spinal tenderness to palpation. Full range of motion bilaterally in the upper extremities. EXTREMITIES:  No peripheral edema.   SKIN:  Clear with no obvious rashes or skin changes. Nail beds are hyperpigmented NEURO:  Nonfocal. Well oriented.  Appropriate affect. ECOG PERFORMANCE STATUS: 1 - Symptomatic but  completely ambulatory   Lab Results: Lab Results  Component Value Date   WBC 3.5* 12/04/2013   HGB 10.6* 12/04/2013   HCT 31.4* 12/04/2013   MCV 97.2 12/04/2013   PLT 226 12/04/2013     Chemistry      Component Value Date/Time   NA 141 12/04/2013 0836   NA 138 09/25/2013 1320   K 3.3* 12/04/2013 0836   K 3.6* 09/25/2013 1320   CL 98 09/25/2013 1320   CL 104 06/16/2012 1303   CO2 24 12/04/2013 0836   CO2 27 09/25/2013 1320   BUN 12.8 12/04/2013 0836   BUN 9 09/25/2013 1320   CREATININE 0.9 12/04/2013 0836   CREATININE 0.82 09/25/2013 1320      Component Value Date/Time   CALCIUM 9.9 12/04/2013 0836   CALCIUM 10.0 09/25/2013 1320   ALKPHOS 56 12/04/2013 0836   ALKPHOS 42 10/04/2012 1830   AST 22 12/04/2013 0836   AST 32 10/04/2012 1830   ALT 30 12/04/2013 0836   ALT 27 10/04/2012 1830   BILITOT 0.75 12/04/2013 0836   BILITOT 0.7 10/04/2012 1830     Assessment and Plan: Tina [name] 53 y.o. female with  1. Clinical stage IIA ER positive, HER-2/neu positive invasive ductal carcinoma of the right breast.  The patient is currently day 1 of cycle 4 of 6 planned treatments with Taxotere, Carboplatin, Herceptin, and Perjeta.  Her labs are stable today, she will proceed with treatment today.  She has a MRI of her breasts scheduled for 01/26/14.    2. Cardiac: Her last echocardiogram on 11/30/2013 demonstrated a LVEF of 65-70%.  She was evaluated by Dr. Vaughan Browner on 11/30/13 in the cardio-onc clinic. He reviewed her echo, and cleared her to continue with Herceptin. 3 month f/u with echocardiogram was recommended.   3. Constipation: She is taking Miralax and Colace and this has resolved.    4. Numbness: Patients neuropathy is much improved with Gabapentin TID.  She will take 259m TID for this.  She will continue Super B complex.    5. Bone pain after Neulasta:  I continued to recommend that the patient take Claritin 136mdaily beginning the morning after treatment, and daily x 5 days.    6. Dehydration and nausea:  She does get dehydrated and nauseated following her chemotherapy.  I have ordered and scheduled her IV fluids and anti-emetics for Saturday through Tuesday.    The patient will return tomorrow for neulasta and in one week for labs, and evaluation.   She knows to call [facility] the interim for any questions or concerns.  We can certainly see her sooner if needed.  I spent 25 minutes counseling the patient face to face.  The total time spent in the appointment was 30 minutes.  Tina HeadlandNPWest [phone]/04/2014 6:16 AM

## 2013-12-04 NOTE — Telephone Encounter (Signed)
Per staff message and POF I have scheduled appts.  JMW  

## 2013-12-04 NOTE — Telephone Encounter (Signed)
, °

## 2013-12-05 ENCOUNTER — Ambulatory Visit (HOSPITAL_BASED_OUTPATIENT_CLINIC_OR_DEPARTMENT_OTHER): Payer: BC Managed Care – PPO

## 2013-12-05 VITALS — BP 148/88 | HR 94 | Temp 98.5°F | Resp 20

## 2013-12-05 DIAGNOSIS — Z5189 Encounter for other specified aftercare: Secondary | ICD-10-CM

## 2013-12-05 DIAGNOSIS — C50519 Malignant neoplasm of lower-outer quadrant of unspecified female breast: Secondary | ICD-10-CM

## 2013-12-05 DIAGNOSIS — E860 Dehydration: Secondary | ICD-10-CM

## 2013-12-05 DIAGNOSIS — R112 Nausea with vomiting, unspecified: Secondary | ICD-10-CM

## 2013-12-05 MED ORDER — SODIUM CHLORIDE 0.9 % IJ SOLN
10.0000 mL | INTRAMUSCULAR | Status: DC | PRN
  Administered 2013-12-05: 10 mL via INTRAVENOUS
  Filled 2013-12-05: qty 10

## 2013-12-05 MED ORDER — SODIUM CHLORIDE 0.9 % IV SOLN
INTRAVENOUS | Status: DC
  Administered 2013-12-05: 09:00:00 via INTRAVENOUS

## 2013-12-05 MED ORDER — PEGFILGRASTIM INJECTION 6 MG/0.6ML
6.0000 mg | Freq: Once | SUBCUTANEOUS | Status: AC
  Administered 2013-12-05: 6 mg via SUBCUTANEOUS

## 2013-12-05 MED ORDER — ONDANSETRON 16 MG/50ML IVPB (CHCC)
16.0000 mg | Freq: Once | INTRAVENOUS | Status: AC
  Administered 2013-12-05: 16 mg via INTRAVENOUS

## 2013-12-05 MED ORDER — HEPARIN SOD (PORK) LOCK FLUSH 100 UNIT/ML IV SOLN
500.0000 [IU] | Freq: Once | INTRAVENOUS | Status: AC
  Administered 2013-12-05: 500 [IU] via INTRAVENOUS
  Filled 2013-12-05: qty 5

## 2013-12-07 ENCOUNTER — Ambulatory Visit (HOSPITAL_BASED_OUTPATIENT_CLINIC_OR_DEPARTMENT_OTHER): Payer: BC Managed Care – PPO

## 2013-12-07 VITALS — BP 139/79 | HR 99 | Temp 97.7°F | Resp 18

## 2013-12-07 DIAGNOSIS — R110 Nausea: Secondary | ICD-10-CM

## 2013-12-07 DIAGNOSIS — E860 Dehydration: Secondary | ICD-10-CM

## 2013-12-07 DIAGNOSIS — C50519 Malignant neoplasm of lower-outer quadrant of unspecified female breast: Secondary | ICD-10-CM

## 2013-12-07 MED ORDER — ONDANSETRON 16 MG/50ML IVPB (CHCC)
16.0000 mg | Freq: Once | INTRAVENOUS | Status: AC
  Administered 2013-12-07: 16 mg via INTRAVENOUS

## 2013-12-07 MED ORDER — SODIUM CHLORIDE 0.9 % IV SOLN
INTRAVENOUS | Status: DC
  Administered 2013-12-07: 11:00:00 via INTRAVENOUS

## 2013-12-07 MED ORDER — ONDANSETRON 16 MG/50ML IVPB (CHCC)
INTRAVENOUS | Status: AC
  Filled 2013-12-07: qty 16

## 2013-12-07 NOTE — Patient Instructions (Signed)
Dehydration, Adult Dehydration is when you lose more fluids from the body than you take in. Vital organs like the kidneys, brain, and heart cannot function without a proper amount of fluids and salt. Any loss of fluids from the body can cause dehydration.  CAUSES   Vomiting.  Diarrhea.  Excessive sweating.  Excessive urine output.  Fever. SYMPTOMS  Mild dehydration  Thirst.  Dry lips.  Slightly dry mouth. Moderate dehydration  Very dry mouth.  Sunken eyes.  Skin does not bounce back quickly when lightly pinched and released.  Dark urine and decreased urine production.  Decreased tear production.  Headache. Severe dehydration  Very dry mouth.  Extreme thirst.  Rapid, weak pulse (more than 100 beats per minute at rest).  Cold hands and feet.  Not able to sweat in spite of heat and temperature.  Rapid breathing.  Blue lips.  Confusion and lethargy.  Difficulty being awakened.  Minimal urine production.  No tears. DIAGNOSIS  Your caregiver will diagnose dehydration based on your symptoms and your exam. Blood and urine tests will help confirm the diagnosis. The diagnostic evaluation should also identify the cause of dehydration. TREATMENT  Treatment of mild or moderate dehydration can often be done at home by increasing the amount of fluids that you drink. It is best to drink small amounts of fluid more often. Drinking too much at one time can make vomiting worse. Refer to the home care instructions below. Severe dehydration needs to be treated at the hospital where you will probably be given intravenous (IV) fluids that contain water and electrolytes. HOME CARE INSTRUCTIONS   Ask your caregiver about specific rehydration instructions.  Drink enough fluids to keep your urine clear or pale yellow.  Drink small amounts frequently if you have nausea and vomiting.  Eat as you normally do.  Avoid:  Foods or drinks high in sugar.  Carbonated  drinks.  Juice.  Extremely hot or cold fluids.  Drinks with caffeine.  Fatty, greasy foods.  Alcohol.  Tobacco.  Overeating.  Gelatin desserts.  Wash your hands well to avoid spreading bacteria and viruses.  Only take over-the-counter or prescription medicines for pain, discomfort, or fever as directed by your caregiver.  Ask your caregiver if you should continue all prescribed and over-the-counter medicines.  Keep all follow-up appointments with your caregiver. SEEK MEDICAL CARE IF:  You have abdominal pain and it increases or stays in one area (localizes).  You have a rash, stiff neck, or severe headache.  You are irritable, sleepy, or difficult to awaken.  You are weak, dizzy, or extremely thirsty. SEEK IMMEDIATE MEDICAL CARE IF:   You are unable to keep fluids down or you get worse despite treatment.  You have frequent episodes of vomiting or diarrhea.  You have blood or green matter (bile) in your vomit.  You have blood in your stool or your stool looks black and tarry.  You have not urinated in 6 to 8 hours, or you have only urinated a small amount of very dark urine.  You have a fever.  You faint. MAKE SURE YOU:   Understand these instructions.  Will watch your condition.  Will get help right away if you are not doing well or get worse. Document Released: 07/16/2005 Document Revised: 10/08/2011 Document Reviewed: 03/05/2011 [facility] Patient Information 2014 [place], [place].   Nausea, Adult Nausea is the feeling that you have an upset stomach or have to vomit. Nausea by itself is not likely a serious concern, but  it may be an early sign of more serious medical problems. As nausea gets worse, it can lead to vomiting. If vomiting develops, there is the risk of dehydration.  CAUSES   Viral infections.  Food poisoning.  Medicines.  Pregnancy.  Motion sickness.  Migraine headaches.  Emotional distress.  Severe pain from any  source.  Alcohol intoxication. HOME CARE INSTRUCTIONS  Get plenty of rest.  Ask your caregiver about specific rehydration instructions.  Eat small amounts of food and sip liquids more often.  Take all medicines as told by your caregiver. SEEK MEDICAL CARE IF:  You have not improved after 2 days, or you get worse.  You have a headache. SEEK IMMEDIATE MEDICAL CARE IF:   You have a fever.  You faint.  You keep vomiting or have blood in your vomit.  You are extremely weak or dehydrated.  You have dark or bloody stools.  You have severe chest or abdominal pain. MAKE SURE YOU:  Understand these instructions.  Will watch your condition.  Will get help right away if you are not doing well or get worse. Document Released: 08/23/2004 Document Revised: 04/09/2012 Document Reviewed: 03/28/2011 [facility] Patient Information 2014 [place], [place].

## 2013-12-08 ENCOUNTER — Other Ambulatory Visit: Payer: Self-pay | Admitting: *Deleted

## 2013-12-08 ENCOUNTER — Ambulatory Visit (HOSPITAL_BASED_OUTPATIENT_CLINIC_OR_DEPARTMENT_OTHER): Payer: BC Managed Care – PPO

## 2013-12-08 VITALS — BP 144/94 | HR 90 | Temp 98.3°F | Resp 19

## 2013-12-08 DIAGNOSIS — C50519 Malignant neoplasm of lower-outer quadrant of unspecified female breast: Secondary | ICD-10-CM

## 2013-12-08 DIAGNOSIS — E860 Dehydration: Secondary | ICD-10-CM

## 2013-12-08 MED ORDER — ONDANSETRON 16 MG/50ML IVPB (CHCC)
INTRAVENOUS | Status: AC
  Filled 2013-12-08: qty 16

## 2013-12-08 MED ORDER — SODIUM CHLORIDE 0.9 % IV SOLN
Freq: Once | INTRAVENOUS | Status: AC
  Administered 2013-12-08: 13:00:00 via INTRAVENOUS

## 2013-12-08 MED ORDER — ONDANSETRON 16 MG/50ML IVPB (CHCC)
16.0000 mg | Freq: Once | INTRAVENOUS | Status: AC
  Administered 2013-12-08: 16 mg via INTRAVENOUS

## 2013-12-08 NOTE — Patient Instructions (Signed)
Dehydration, Adult Dehydration is when you lose more fluids from the body than you take in. Vital organs like the kidneys, brain, and heart cannot function without a proper amount of fluids and salt. Any loss of fluids from the body can cause dehydration.  CAUSES   Vomiting.  Diarrhea.  Excessive sweating.  Excessive urine output.  Fever. SYMPTOMS  Mild dehydration  Thirst.  Dry lips.  Slightly dry mouth. Moderate dehydration  Very dry mouth.  Sunken eyes.  Skin does not bounce back quickly when lightly pinched and released.  Dark urine and decreased urine production.  Decreased tear production.  Headache. Severe dehydration  Very dry mouth.  Extreme thirst.  Rapid, weak pulse (more than 100 beats per minute at rest).  Cold hands and feet.  Not able to sweat in spite of heat and temperature.  Rapid breathing.  Blue lips.  Confusion and lethargy.  Difficulty being awakened.  Minimal urine production.  No tears. DIAGNOSIS  Your caregiver will diagnose dehydration based on your symptoms and your exam. Blood and urine tests will help confirm the diagnosis. The diagnostic evaluation should also identify the cause of dehydration. TREATMENT  Treatment of mild or moderate dehydration can often be done at home by increasing the amount of fluids that you drink. It is best to drink small amounts of fluid more often. Drinking too much at one time can make vomiting worse. Refer to the home care instructions below. Severe dehydration needs to be treated at the hospital where you will probably be given intravenous (IV) fluids that contain water and electrolytes. HOME CARE INSTRUCTIONS   Ask your caregiver about specific rehydration instructions.  Drink enough fluids to keep your urine clear or pale yellow.  Drink small amounts frequently if you have nausea and vomiting.  Eat as you normally do.  Avoid:  Foods or drinks high in sugar.  Carbonated  drinks.  Juice.  Extremely hot or cold fluids.  Drinks with caffeine.  Fatty, greasy foods.  Alcohol.  Tobacco.  Overeating.  Gelatin desserts.  Wash your hands well to avoid spreading bacteria and viruses.  Only take over-the-counter or prescription medicines for pain, discomfort, or fever as directed by your caregiver.  Ask your caregiver if you should continue all prescribed and over-the-counter medicines.  Keep all follow-up appointments with your caregiver. SEEK MEDICAL CARE IF:  You have abdominal pain and it increases or stays in one area (localizes).  You have a rash, stiff neck, or severe headache.  You are irritable, sleepy, or difficult to awaken.  You are weak, dizzy, or extremely thirsty. SEEK IMMEDIATE MEDICAL CARE IF:   You are unable to keep fluids down or you get worse despite treatment.  You have frequent episodes of vomiting or diarrhea.  You have blood or green matter (bile) in your vomit.  You have blood in your stool or your stool looks black and tarry.  You have not urinated in 6 to 8 hours, or you have only urinated a small amount of very dark urine.  You have a fever.  You faint. MAKE SURE YOU:   Understand these instructions.  Will watch your condition.  Will get help right away if you are not doing well or get worse. Document Released: 07/16/2005 Document Revised: 10/08/2011 Document Reviewed: 03/05/2011 Exit[employer] Patient Information 2014 [employer].  

## 2013-12-10 ENCOUNTER — Other Ambulatory Visit: Payer: Self-pay | Admitting: Oncology

## 2013-12-11 ENCOUNTER — Other Ambulatory Visit: Payer: BC Managed Care – PPO

## 2013-12-11 ENCOUNTER — Other Ambulatory Visit: Payer: Self-pay | Admitting: *Deleted

## 2013-12-11 ENCOUNTER — Encounter: Payer: Self-pay | Admitting: Adult Health

## 2013-12-11 ENCOUNTER — Ambulatory Visit (HOSPITAL_BASED_OUTPATIENT_CLINIC_OR_DEPARTMENT_OTHER): Payer: BC Managed Care – PPO

## 2013-12-11 ENCOUNTER — Ambulatory Visit (HOSPITAL_BASED_OUTPATIENT_CLINIC_OR_DEPARTMENT_OTHER): Payer: BC Managed Care – PPO | Admitting: Adult Health

## 2013-12-11 ENCOUNTER — Telehealth: Payer: Self-pay | Admitting: Adult Health

## 2013-12-11 VITALS — BP 152/97 | HR 103 | Temp 98.5°F | Resp 18 | Ht 64.0 in | Wt 181.6 lb

## 2013-12-11 DIAGNOSIS — C50519 Malignant neoplasm of lower-outer quadrant of unspecified female breast: Secondary | ICD-10-CM

## 2013-12-11 DIAGNOSIS — Z5111 Encounter for antineoplastic chemotherapy: Secondary | ICD-10-CM

## 2013-12-11 DIAGNOSIS — G629 Polyneuropathy, unspecified: Secondary | ICD-10-CM

## 2013-12-11 DIAGNOSIS — G5690 Unspecified mononeuropathy of unspecified upper limb: Secondary | ICD-10-CM

## 2013-12-11 DIAGNOSIS — Z170 Estrogen receptor positive status [ER+]: Secondary | ICD-10-CM

## 2013-12-11 DIAGNOSIS — G5790 Unspecified mononeuropathy of unspecified lower limb: Secondary | ICD-10-CM

## 2013-12-11 LAB — CBC WITH DIFFERENTIAL/PLATELET
BASO%: 0.7 % (ref 0.0–2.0)
BASOS ABS: 0 10*3/uL (ref 0.0–0.1)
EOS ABS: 0.1 10*3/uL (ref 0.0–0.5)
EOS%: 1.2 % (ref 0.0–7.0)
HEMATOCRIT: 32.6 % — AB (ref 34.8–46.6)
HEMOGLOBIN: 11.4 g/dL — AB (ref 11.6–15.9)
LYMPH%: 33.9 % (ref 14.0–49.7)
MCH: 34.7 pg — AB (ref 25.1–34.0)
MCHC: 34.9 g/dL (ref 31.5–36.0)
MCV: 99.2 fL (ref 79.5–101.0)
MONO#: 0.2 10*3/uL (ref 0.1–0.9)
MONO%: 4.8 % (ref 0.0–14.0)
NEUT%: 59.4 % (ref 38.4–76.8)
NEUTROS ABS: 2.8 10*3/uL (ref 1.5–6.5)
Platelets: 149 10*3/uL (ref 145–400)
RBC: 3.29 10*6/uL — ABNORMAL LOW (ref 3.70–5.45)
RDW: 15.9 % — AB (ref 11.2–14.5)
WBC: 4.7 10*3/uL (ref 3.9–10.3)
lymph#: 1.6 10*3/uL (ref 0.9–3.3)

## 2013-12-11 LAB — COMPREHENSIVE METABOLIC PANEL (CC13)
ALT: 19 U/L (ref 0–55)
AST: 12 U/L (ref 5–34)
Albumin: 3.6 g/dL (ref 3.5–5.0)
Alkaline Phosphatase: 68 U/L (ref 40–150)
Anion Gap: 12 mEq/L — ABNORMAL HIGH (ref 3–11)
BUN: 12.5 mg/dL (ref 7.0–26.0)
CALCIUM: 9.2 mg/dL (ref 8.4–10.4)
CHLORIDE: 100 meq/L (ref 98–109)
CO2: 25 mEq/L (ref 22–29)
CREATININE: 0.9 mg/dL (ref 0.6–1.1)
GLUCOSE: 192 mg/dL — AB (ref 70–140)
POTASSIUM: 4 meq/L (ref 3.5–5.1)
Sodium: 137 mEq/L (ref 136–145)
Total Bilirubin: 0.49 mg/dL (ref 0.20–1.20)
Total Protein: 6.9 g/dL (ref 6.4–8.3)

## 2013-12-11 MED ORDER — GABAPENTIN 100 MG PO CAPS: 300.0000 mg | ORAL_CAPSULE | Freq: Three times a day (TID) | ORAL | Status: DC

## 2013-12-11 MED ORDER — GOSERELIN ACETATE 3.6 MG ~~LOC~~ IMPL
3.6000 mg | DRUG_IMPLANT | Freq: Once | SUBCUTANEOUS | Status: AC
  Administered 2013-12-11: 3.6 mg via SUBCUTANEOUS
  Filled 2013-12-11: qty 3.6

## 2013-12-11 NOTE — Progress Notes (Signed)
ID: Tina [name] OB: September 03, 1960  MR#: [id number]  CSN#:[id number]  PCP: Tina Kern., DO GYN:   SU:  OTHER MD:  CHIEF COMPLAINT:Tina [name] 53 y.o. [place], [place] woman with clinical stage T2Nx IIA invasive ductal carcinoma, grade 3, ER positive, HER-2/neu positive invasive ductal carcinoma of the right breast   BREAST CANCER HISTORY:  1. Underwent screening mammogram. On the right side she was noted to have a mass with microcalcifications. Ultrasound in February 2015 showed a 1.2 x 1.1 x 1.0 cm rounded hypoechoic mass with irregular margins in the 8:00 position 4 cm from the nipple. No abnormal axillary lymph nodes were noted. She had MRI of the breasts performed that showed in the lower outer right breast 2.2 x 1.3 x 1.5 cm Irregular mass. In the upper outer left breast there was a 7 x 5 x 8 mm indeterminate enhancing mass. Also another 9 x 7 x 6 mm indeterminant enhancing mass at 9:00 in the left breast was noted.there are also noted to be ominent cortices of right axillary lymph nodes. Patient has undergone biopsy of these. On the right patient had biopsy performed of the 1.2 cm mass. The pathology revealed grade 3 invasive ductal carcinoma with ductal carcinoma in situ. Tumor was ER +70% PR +6% HER-2/neu was amplified and proliferation marker Ki-67 70%   2. Referred to genetic counseling: testing was negative.    CURRENT THERAPY: Neoadjuvant Docetaxel, Carboplatin, Trastuzumab and Pertuzumab cycle 4 day 8  INTERVAL HISTORY:  Tina [name] is here today for evaluation following her fourth cycle of neoadjuvant Docetaxel, Carboplatin, Trastuzumab and Pertuzumab.  She receives this treatment on day 1 of a 21 day cycle with Neulasta given for granulocyte support.  She is day 8 of cycle 4.  She received IV fluids and IV Zofran on the Saturday, Monday, and Tuesday following treatment due to her nausea and decreased PO intake following chemotherapy.    Tina [name] is doing well today.  She  tells me that the IV fluids and the anti-emetics that she received following treatment helped her very much and she feels good today.  She does have an increase in numbness in her fingertips and feet.  She has mild balance changes due to this.  She says that her feet are worse than her hands.  She has retained her motor function in her fingertips.  She denies fevers, chills, constipation, diarrhea, constipation, skin changes, mouth pain, or any further concerns.  A 10 point ROS is otherwise negative.    REVIEW OF SYSTEMS:  A 10 point review of systems was conducted and is otherwise negative except for what is noted above.     PAST MEDICAL HISTORY: Past Medical History  Diagnosis Date  . Leukocytopenia   . Hypertension   . Degenerative joint disease   . Leukopenia 06/16/2013  . Needs flu shot 06/17/2013  . Cancer of right breast 09/03/13    Invasive Ductal Carcinoma/Ductal Carcinoma Insitu    PAST SURGICAL HISTORY: Past Surgical History  Procedure Laterality Date  . Cesarean section    . Carpal tunnel release    . Trigger finger release      FAMILY HISTORY Family History  Problem Relation Age of Onset  . Hypertension Mother   . Dementia Mother     small vessel disease  . Cancer Mother 30    cervical  cancer  . Cancer Father 39    prostate ca  . Diabetes Father   . Arthritis Father  GYNECOLOGIC HISTORY: menarche at age 35, G47 P3, no h/o abnormal pap smears, LMP 09/2013 (on Zoladex), has h/o chlamydia at age 43. Used progesterone cream x 1 month.      SOCIAL HISTORY: Lives with husband of 3 years, Adelle Zachar, in a 2 story house.  Drinks ETOH occasionally, has 10 pack year tobacco history, no illicit drug use.      ADVANCED DIRECTIVES: not in place   HEALTH MAINTENANCE: History  Substance Use Topics  . Smoking status: Former Smoker -- 1.00 packs/day for 15 years    Types: Cigars, Cigarettes  . Smokeless tobacco: Never Used     Comment: remote smoking history  .  Alcohol Use: 0.0 oz/week    0 Shots of liquor per week     Comment: occ     Mammogram: 08/2013 Colonoscopy: not yet Bone Density Scan: n/a Pap Smear:  06/25/2012 Eye Exam: 03/2013 Vitamin D Level:  n/a Lipid Panel: 04/2013   Allergies  Allergen Reactions  . Adhesive [Tape]   . Nickel     Current Outpatient Prescriptions  Medication Sig Dispense Refill  . ascorbic acid (VITAMIN C) 1000 MG tablet Take 1,000 mg by mouth daily.      Marland Kitchen aspirin 81 MG tablet Take 81 mg by mouth daily.      Marland Kitchen BIOTIN 5000 PO Take by mouth daily.       . Calcium Carbonate-Vit D-Min (CALTRATE 600+D PLUS MINERALS PO) Take 1 tablet by mouth daily.      . Cholecalciferol (VITAMIN D3) 10000 UNITS capsule Take 10,000 Units by mouth daily.      . Coenzyme Q10 (CO Q 10 PO) Take by mouth daily.      Marland Kitchen lisinopril-hydrochlorothiazide (PRINZIDE,ZESTORETIC) 20-12.5 MG per tablet Take 1 tablet by mouth daily.       Marland Kitchen omeprazole (PRILOSEC) 40 MG capsule Take 1 capsule (40 mg total) by mouth daily.  30 capsule  6  . OVER THE COUNTER MEDICATION Take 1 tablet by mouth daily. Trace Minerals      . potassium chloride SA (K-DUR,KLOR-CON) 20 MEQ tablet Take 1 tablet (20 mEq total) by mouth once. X 3 days, then stop  5 tablet  0  . Probiotic Product (TRUBIOTICS) CAPS Take 1 capsule by mouth daily.      . Alum & Mag Hydroxide-Simeth (MAGIC MOUTHWASH) SOLN Take 5 mLs by mouth 4 (four) times daily.  100 mL  2  . dexamethasone (DECADRON) 4 MG tablet Take 2 tablets (8 mg total) by mouth 2 (two) times daily. Start the day before Taxotere. Then again the day after chemo for 3 days.  30 tablet  1  . gabapentin (NEURONTIN) 100 MG capsule Take 3 capsules (300 mg total) by mouth 3 (three) times daily.  270 capsule  2  . L-ARGININE PO Take by mouth daily.      Marland Kitchen lidocaine-prilocaine (EMLA) cream Apply topically as needed.  30 g  6  . LORazepam (ATIVAN) 0.5 MG tablet Take 1 tablet (0.5 mg total) by mouth every 6 (six) hours as needed (Nausea  or vomiting).  30 tablet  0  . nystatin (MYCOSTATIN) 100000 UNIT/ML suspension Take 5 mLs (500,000 Units total) by mouth 4 (four) times daily.  60 mL  0  . ondansetron (ZOFRAN) 8 MG tablet Take 1 tablet (8 mg total) by mouth 2 (two) times daily. Start the day after chemo for 3 days. Then take as needed for nausea or vomiting.  30 tablet  1  .  oxyCODONE (OXY IR/ROXICODONE) 5 MG immediate release tablet Take 1 tablet (5 mg total) by mouth every 4 (four) hours as needed for severe pain.  60 tablet  0  . prochlorperazine (COMPAZINE) 10 MG tablet Take 1 tablet (10 mg total) by mouth every 6 (six) hours as needed (Nausea or vomiting).  30 tablet  1  . VITAMIN E PO Take 1 capsule by mouth daily.        No current facility-administered medications for this visit.    OBJECTIVE: Filed Vitals:   12/11/13 0838  BP: 152/97  Pulse: 103  Temp: 98.5 F (36.9 C)  Resp: 18     Body mass index is 31.16 kg/(m^2).     GENERAL: Patient is a well appearing female in no acute distress HEENT:  Sclerae anicteric.  Oropharynx clear and moist. No ulcerations or evidence of oropharyngeal candidiasis. Neck is supple.  NODES:  No cervical, supraclavicular, or axillary lymphadenopathy palpated.  BREAST EXAM:  Unable to palpate a right breast mass LUNGS:  Clear to auscultation bilaterally.  No wheezes or rhonchi. HEART:  Regular rate and rhythm. No murmur appreciated. ABDOMEN:  Soft, nontender.  Positive, normoactive bowel sounds. No organomegaly palpated. MSK:  No focal spinal tenderness to palpation. Full range of motion bilaterally in the upper extremities. EXTREMITIES:  No peripheral edema.   SKIN:  Clear with no obvious rashes or skin changes. No nail dyscrasia. NEURO:  Nonfocal. Well oriented.  Appropriate affect. ECOG FS:1 - Symptomatic but completely ambulatory  LAB RESULTS:  CMP     Component Value Date/Time   NA 137 12/11/2013 0817   NA 138 09/25/2013 1320   K 4.0 12/11/2013 0817   K 3.6* 09/25/2013  1320   CL 98 09/25/2013 1320   CL 104 06/16/2012 1303   CO2 25 12/11/2013 0817   CO2 27 09/25/2013 1320   GLUCOSE 192* 12/11/2013 0817   GLUCOSE 82 09/25/2013 1320   GLUCOSE 85 06/16/2012 1303   BUN 12.5 12/11/2013 0817   BUN 9 09/25/2013 1320   CREATININE 0.9 12/11/2013 0817   CREATININE 0.82 09/25/2013 1320   CALCIUM 9.2 12/11/2013 0817   CALCIUM 10.0 09/25/2013 1320   PROT 6.9 12/11/2013 0817   PROT 8.7* 10/04/2012 1830   ALBUMIN 3.6 12/11/2013 0817   ALBUMIN 4.1 10/04/2012 1830   AST 12 12/11/2013 0817   AST 32 10/04/2012 1830   ALT 19 12/11/2013 0817   ALT 27 10/04/2012 1830   ALKPHOS 68 12/11/2013 0817   ALKPHOS 42 10/04/2012 1830   BILITOT 0.49 12/11/2013 0817   BILITOT 0.7 10/04/2012 1830   GFRNONAA 82* 09/25/2013 1320   GFRAA >90 09/25/2013 1320    I No results found for this basename: SPEP,  UPEP,   kappa and lambda light chains    Lab Results  Component Value Date   WBC 4.7 12/11/2013   NEUTROABS 2.8 12/11/2013   HGB 11.4* 12/11/2013   HCT 32.6* 12/11/2013   MCV 99.2 12/11/2013   PLT 149 12/11/2013      Chemistry      Component Value Date/Time   NA 137 12/11/2013 0817   NA 138 09/25/2013 1320   K 4.0 12/11/2013 0817   K 3.6* 09/25/2013 1320   CL 98 09/25/2013 1320   CL 104 06/16/2012 1303   CO2 25 12/11/2013 0817   CO2 27 09/25/2013 1320   BUN 12.5 12/11/2013 0817   BUN 9 09/25/2013 1320   CREATININE 0.9 12/11/2013 0817   CREATININE 0.82  09/25/2013 1320      Component Value Date/Time   CALCIUM 9.2 12/11/2013 0817   CALCIUM 10.0 09/25/2013 1320   ALKPHOS 68 12/11/2013 0817   ALKPHOS 42 10/04/2012 1830   AST 12 12/11/2013 0817   AST 32 10/04/2012 1830   ALT 19 12/11/2013 0817   ALT 27 10/04/2012 1830   BILITOT 0.49 12/11/2013 0817   BILITOT 0.7 10/04/2012 1830       No results found for this basename: LABCA2    No components found with this basename: LABCA125    No results found for this basename: INR,  in the last 168 hours  Urinalysis    Component Value Date/Time   LABSPEC 1.005  10/09/2013 1646   GLUCOSEU Negative 10/09/2013 1646   UROBILINOGEN 0.2 10/09/2013 1646    STUDIES: No results found.  ASSESSMENT: 53 y.o. [place], [place] woman with clinical stage T2Nx IIA invasive ductal carcinoma, grade 3, ER positive, HER-2/neu positive invasive ductal carcinoma of the right breast.   1.  Patient was started on neoadjuvant Docetaxel, Carboplatin, Trastuzumab, Pertuzumab that started on 10/02/13. She receives treatment on day 1 of a 21 day cycle with Neulasta given on day 2 for granulocyte support.   PLAN:   Mrs. Horrell is doing well following her fourth cycle of neoadjuvant Docetaxel, Carboplatin, Trastuzumab, and Pertuzumab.  Her labs are stable.  I reviewed them with her in detail.  She does have an increase in neuropathy.  I increased her Gabapentin to 331m TID.  We will likely need to forego future treatments with Taxotere.  I will discuss this with Dr. MJana Hakimand we will re-evaluate her at her next appointment in two weeks.  Her nausea following chemotherapy has resolved and she improved greatly with 3 consecutive days of IV hydration and IV anti-emetics.    Cardiac: Her last echocardiogram on 11/30/2013 demonstrated a LVEF of 65-70%. She was evaluated by Dr. BVaughan Browneron 11/30/13 in the cardio-onc clinic. He reviewed her echo, and cleared her to continue with Herceptin. 3 month f/u with echocardiogram was recommended.    The patient will return in two weeks for labs and evaluation, and possible chemotherapy.   She knows to call [facility] the interim for any questions or concerns.  We can certainly see her sooner if needed.  I spent 25 minutes counseling the patient face to face.  The total time spent in the appointment was 30 minutes.  LMinette Headland [phone]   12/12/2013 8:10 AM

## 2013-12-11 NOTE — Patient Instructions (Signed)
Goserelin injection What is this medicine? GOSERELIN (GOE se rel in) is similar to a hormone found in the body. It lowers the amount of sex hormones that the body makes. Men will have lower testosterone levels and women will have lower estrogen levels while taking this medicine. In men, this medicine is used to treat prostate cancer; the injection is either given once per month or once every 12 weeks. A once per month injection (only) is used to treat women with endometriosis, dysfunctional uterine bleeding, or advanced breast cancer. This medicine may be used for other purposes; ask your health care provider or pharmacist if you have questions. COMMON BRAND NAME(S): Zoladex What should I tell my health care provider before I take this medicine? They need to know if you have any of these conditions (some only apply to women): -diabetes -heart disease or previous heart attack -high blood pressure -high cholesterol -kidney disease -osteoporosis or low bone density -problems passing urine -spinal cord injury -stroke -tobacco smoker -an unusual or allergic reaction to goserelin, hormone therapy, other medicines, foods, dyes, or preservatives -pregnant or trying to get pregnant -breast-feeding How should I use this medicine? This medicine is for injection under the skin. It is given by a health care professional in a hospital or clinic setting. Men receive this injection once every 4 weeks or once every 12 weeks. Women will only receive the once every 4 weeks injection. Talk to your pediatrician regarding the use of this medicine in children. Special care may be needed. Overdosage: If you think you have taken too much of this medicine contact a poison control center or emergency room at once. NOTE: This medicine is only for you. Do not share this medicine with others. What if I miss a dose? It is important not to miss your dose. Call your doctor or health care professional if you are unable to  keep an appointment. What may interact with this medicine? -female hormones like estrogen -herbal or dietary supplements like black cohosh, chasteberry, or DHEA -female hormones like testosterone -prasterone This list may not describe all possible interactions. Give your health care provider a list of all the medicines, herbs, non-prescription drugs, or dietary supplements you use. Also tell them if you smoke, drink alcohol, or use illegal drugs. Some items may interact with your medicine. What should I watch for while using this medicine? Visit your doctor or health care professional for regular checks on your progress. Your symptoms may appear to get worse during the first weeks of this therapy. Tell your doctor or healthcare professional if your symptoms do not start to get better or if they get worse after this time. Your bones may get weaker if you take this medicine for a long time. If you smoke or frequently drink alcohol you may increase your risk of bone loss. A family history of osteoporosis, chronic use of drugs for seizures (convulsions), or corticosteroids can also increase your risk of bone loss. Talk to your doctor about how to keep your bones strong. This medicine should stop regular monthly menstration in women. Tell your doctor if you continue to menstrate. Women should not become pregnant while taking this medicine or for 12 weeks after stopping this medicine. Women should inform their doctor if they wish to become pregnant or think they might be pregnant. There is a potential for serious side effects to an unborn child. Talk to your health care professional or pharmacist for more information. Do not breast-feed an infant while taking   this medicine. Men should inform their doctors if they wish to father a child. This medicine may lower sperm counts. Talk to your health care professional or pharmacist for more information. What side effects may I notice from receiving this  medicine? Side effects that you should report to your doctor or health care professional as soon as possible: -allergic reactions like skin rash, itching or hives, swelling of the face, lips, or tongue -bone pain -breathing problems -changes in vision -chest pain -feeling faint or lightheaded, falls -fever, chills -pain, swelling, warmth in the leg -pain, tingling, numbness in the hands or feet -swelling of the ankles, feet, hands -trouble passing urine or change in the amount of urine -unusually high or low blood pressure -unusually weak or tired Side effects that usually do not require medical attention (report to your doctor or health care professional if they continue or are bothersome): -change in sex drive or performance -changes in breast size in both males and females -changes in emotions or moods -headache -hot flashes -irritation at site where injected -loss of appetite -skin problems like acne, dry skin -vaginal dryness This list may not describe all possible side effects. Call your doctor for medical advice about side effects. You may report side effects to FDA at 1-800-FDA-1088. Where should I keep my medicine? This drug is given in a hospital or clinic and will not be stored at home. NOTE: This sheet is a summary. It may not cover all possible information. If you have questions about this medicine, talk to your doctor, pharmacist, or health care provider.  2014, [employer] Standard. (2008-11-30 13:28:29)  

## 2013-12-11 NOTE — Telephone Encounter (Signed)
per pof to send pt to flush nurse-adv pt and gave pt copy of sch

## 2013-12-14 ENCOUNTER — Other Ambulatory Visit: Payer: Self-pay | Admitting: Obstetrics

## 2013-12-14 ENCOUNTER — Telehealth: Payer: Self-pay | Admitting: Family Medicine

## 2013-12-14 DIAGNOSIS — N949 Unspecified condition associated with female genital organs and menstrual cycle: Secondary | ICD-10-CM

## 2013-12-14 DIAGNOSIS — D72819 Decreased white blood cell count, unspecified: Secondary | ICD-10-CM

## 2013-12-14 NOTE — Telephone Encounter (Signed)
Pt is currently on rx lisinopril-hydrochlorothiazide (PRINZIDE,ZESTORETIC) 20-12.5 MG per tablet, states she was taking 2 a day, however dr.kim only prescribed 1 day. Pt states her bp is elevated now, pt was dx with breast cancer and she states she is gaining weight due to the chemo and steroids that she is taking. Please advise if she should go back to 2 a day.

## 2013-12-15 MED ORDER — LISINOPRIL-HYDROCHLOROTHIAZIDE 20-12.5 MG PO TABS: 2.0000 | ORAL_TABLET | Freq: Every day | ORAL | Status: DC

## 2013-12-15 NOTE — Telephone Encounter (Signed)
Yes  rx changed

## 2013-12-15 NOTE — Addendum Note (Signed)
Addended by: [name] on: 12/15/2013 03:20 PM   Modules accepted: Orders

## 2013-12-16 NOTE — Telephone Encounter (Signed)
I called the pt and advised her the Rx was changed and Dr Maudie Mercury sent this in yesterday.

## 2013-12-23 ENCOUNTER — Telehealth: Payer: Self-pay | Admitting: *Deleted

## 2013-12-23 NOTE — Telephone Encounter (Signed)
Received voice message from patient that she noticed that her hands and feet were swollen. Patient stated, "I can move all of my fingers, I can touch all of my fingertips together and move all of my toes." Patient denied CP, SOB, dizziness, lightheadedness, nausea, vomiting and diarrhea. Patient stated,"my arms, legs and feet are all the same size. One is not bigger than the other." I instructed patient to keep her feet elevated, monitor arms, legs and feet for swelling, if she developed SOB at anytime to go to the ER. Patient already has a lab/MD appointment this week. Instructed patient to call the office if anything happens before appointment. Patient verbalized understanding.

## 2013-12-24 NOTE — Telephone Encounter (Signed)
Let her know its likely the chemo and that I will review with her on Friday.  Thanks, [place]

## 2013-12-25 ENCOUNTER — Telehealth: Payer: Self-pay | Admitting: *Deleted

## 2013-12-25 ENCOUNTER — Other Ambulatory Visit: Payer: Self-pay | Admitting: *Deleted

## 2013-12-25 ENCOUNTER — Ambulatory Visit (HOSPITAL_BASED_OUTPATIENT_CLINIC_OR_DEPARTMENT_OTHER): Payer: BC Managed Care – PPO

## 2013-12-25 ENCOUNTER — Encounter: Payer: Self-pay | Admitting: Adult Health

## 2013-12-25 ENCOUNTER — Other Ambulatory Visit: Payer: Self-pay | Admitting: Oncology

## 2013-12-25 ENCOUNTER — Telehealth: Payer: Self-pay | Admitting: Adult Health

## 2013-12-25 ENCOUNTER — Ambulatory Visit (HOSPITAL_BASED_OUTPATIENT_CLINIC_OR_DEPARTMENT_OTHER): Payer: BC Managed Care – PPO | Admitting: Adult Health

## 2013-12-25 ENCOUNTER — Other Ambulatory Visit (HOSPITAL_BASED_OUTPATIENT_CLINIC_OR_DEPARTMENT_OTHER): Payer: BC Managed Care – PPO

## 2013-12-25 VITALS — BP 162/98 | HR 102 | Temp 98.3°F | Resp 20 | Ht 64.0 in | Wt 186.3 lb

## 2013-12-25 DIAGNOSIS — C50519 Malignant neoplasm of lower-outer quadrant of unspecified female breast: Secondary | ICD-10-CM

## 2013-12-25 DIAGNOSIS — Z5112 Encounter for antineoplastic immunotherapy: Secondary | ICD-10-CM

## 2013-12-25 DIAGNOSIS — Z5111 Encounter for antineoplastic chemotherapy: Secondary | ICD-10-CM

## 2013-12-25 DIAGNOSIS — G589 Mononeuropathy, unspecified: Secondary | ICD-10-CM

## 2013-12-25 DIAGNOSIS — Z170 Estrogen receptor positive status [ER+]: Secondary | ICD-10-CM

## 2013-12-25 LAB — COMPREHENSIVE METABOLIC PANEL (CC13)
ALBUMIN: 3.8 g/dL (ref 3.5–5.0)
ALK PHOS: 49 U/L (ref 40–150)
ALT: 26 U/L (ref 0–55)
AST: 22 U/L (ref 5–34)
Anion Gap: 15 mEq/L — ABNORMAL HIGH (ref 3–11)
BUN: 14.7 mg/dL (ref 7.0–26.0)
CO2: 24 mEq/L (ref 22–29)
Calcium: 9.7 mg/dL (ref 8.4–10.4)
Chloride: 103 mEq/L (ref 98–109)
Creatinine: 0.9 mg/dL (ref 0.6–1.1)
GLUCOSE: 176 mg/dL — AB (ref 70–140)
POTASSIUM: 3.4 meq/L — AB (ref 3.5–5.1)
SODIUM: 142 meq/L (ref 136–145)
TOTAL PROTEIN: 7.2 g/dL (ref 6.4–8.3)
Total Bilirubin: 0.57 mg/dL (ref 0.20–1.20)

## 2013-12-25 LAB — CBC WITH DIFFERENTIAL/PLATELET
BASO%: 0.6 % (ref 0.0–2.0)
BASOS ABS: 0 10*3/uL (ref 0.0–0.1)
EOS ABS: 0 10*3/uL (ref 0.0–0.5)
EOS%: 0 % (ref 0.0–7.0)
HCT: 32.6 % — ABNORMAL LOW (ref 34.8–46.6)
HEMOGLOBIN: 11 g/dL — AB (ref 11.6–15.9)
LYMPH#: 0.7 10*3/uL — AB (ref 0.9–3.3)
LYMPH%: 8.9 % — ABNORMAL LOW (ref 14.0–49.7)
MCH: 33.8 pg (ref 25.1–34.0)
MCHC: 33.8 g/dL (ref 31.5–36.0)
MCV: 100 fL (ref 79.5–101.0)
MONO#: 0.4 10*3/uL (ref 0.1–0.9)
MONO%: 5.1 % (ref 0.0–14.0)
NEUT%: 85.4 % — ABNORMAL HIGH (ref 38.4–76.8)
NEUTROS ABS: 6.8 10*3/uL — AB (ref 1.5–6.5)
Platelets: 355 10*3/uL (ref 145–400)
RBC: 3.26 10*6/uL — AB (ref 3.70–5.45)
RDW: 16.1 % — AB (ref 11.2–14.5)
WBC: 7.9 10*3/uL (ref 3.9–10.3)

## 2013-12-25 MED ORDER — DIPHENHYDRAMINE HCL 25 MG PO CAPS
ORAL_CAPSULE | ORAL | Status: AC
  Filled 2013-12-25: qty 2

## 2013-12-25 MED ORDER — LORAZEPAM 2 MG/ML IJ SOLN
0.5000 mg | Freq: Once | INTRAMUSCULAR | Status: AC
  Administered 2013-12-25: 0.5 mg via INTRAVENOUS

## 2013-12-25 MED ORDER — CARBOPLATIN CHEMO INJECTION 600 MG/60ML
610.0000 mg | Freq: Once | INTRAVENOUS | Status: AC
  Administered 2013-12-25: 610 mg via INTRAVENOUS
  Filled 2013-12-25: qty 61

## 2013-12-25 MED ORDER — SODIUM CHLORIDE 0.9 % IV SOLN
150.0000 mg | Freq: Once | INTRAVENOUS | Status: AC
  Administered 2013-12-25: 150 mg via INTRAVENOUS
  Filled 2013-12-25: qty 5

## 2013-12-25 MED ORDER — FUROSEMIDE 10 MG/ML IJ SOLN: 10.0000 mg | Freq: Once | INTRAMUSCULAR | Status: DC

## 2013-12-25 MED ORDER — ACETAMINOPHEN 325 MG PO TABS
650.0000 mg | ORAL_TABLET | Freq: Once | ORAL | Status: AC
  Administered 2013-12-25: 650 mg via ORAL

## 2013-12-25 MED ORDER — DEXAMETHASONE SODIUM PHOSPHATE 20 MG/5ML IJ SOLN
12.0000 mg | Freq: Once | INTRAMUSCULAR | Status: AC
  Administered 2013-12-25: 12 mg via INTRAVENOUS

## 2013-12-25 MED ORDER — PALONOSETRON HCL INJECTION 0.25 MG/5ML
INTRAVENOUS | Status: AC
  Filled 2013-12-25: qty 5

## 2013-12-25 MED ORDER — DIPHENHYDRAMINE HCL 25 MG PO CAPS
50.0000 mg | ORAL_CAPSULE | Freq: Once | ORAL | Status: AC
Start: 2013-12-25 — End: 2013-12-25
  Administered 2013-12-25: 50 mg via ORAL

## 2013-12-25 MED ORDER — GEMCITABINE HCL CHEMO INJECTION 1 GM/26.3ML
800.0000 mg/m2 | Freq: Once | INTRAVENOUS | Status: AC
  Administered 2013-12-25: 1520 mg via INTRAVENOUS
  Filled 2013-12-25: qty 39.98

## 2013-12-25 MED ORDER — HEPARIN SOD (PORK) LOCK FLUSH 100 UNIT/ML IV SOLN
500.0000 [IU] | Freq: Once | INTRAVENOUS | Status: AC | PRN
  Administered 2013-12-25: 500 [IU]
  Filled 2013-12-25: qty 5

## 2013-12-25 MED ORDER — DEXAMETHASONE SODIUM PHOSPHATE 20 MG/5ML IJ SOLN
INTRAMUSCULAR | Status: AC
  Filled 2013-12-25: qty 5

## 2013-12-25 MED ORDER — LORAZEPAM 2 MG/ML IJ SOLN
INTRAMUSCULAR | Status: AC
  Filled 2013-12-25: qty 1

## 2013-12-25 MED ORDER — FUROSEMIDE 10 MG/ML IJ SOLN
10.0000 mg | Freq: Once | INTRAMUSCULAR | Status: AC
  Administered 2013-12-25: 10 mg via INTRAVENOUS

## 2013-12-25 MED ORDER — PALONOSETRON HCL INJECTION 0.25 MG/5ML
0.2500 mg | Freq: Once | INTRAVENOUS | Status: AC
  Administered 2013-12-25: 0.25 mg via INTRAVENOUS

## 2013-12-25 MED ORDER — SODIUM CHLORIDE 0.9 % IV SOLN
Freq: Once | INTRAVENOUS | Status: AC
  Administered 2013-12-25: 10:00:00 via INTRAVENOUS

## 2013-12-25 MED ORDER — ACETAMINOPHEN 325 MG PO TABS
ORAL_TABLET | ORAL | Status: AC
  Filled 2013-12-25: qty 2

## 2013-12-25 MED ORDER — TRASTUZUMAB CHEMO INJECTION 440 MG
6.0000 mg/kg | Freq: Once | INTRAVENOUS | Status: AC
  Administered 2013-12-25: 462 mg via INTRAVENOUS
  Filled 2013-12-25: qty 22

## 2013-12-25 MED ORDER — SODIUM CHLORIDE 0.9 % IJ SOLN
10.0000 mL | INTRAMUSCULAR | Status: DC | PRN
  Administered 2013-12-25: 10 mL
  Filled 2013-12-25: qty 10

## 2013-12-25 MED ORDER — SODIUM CHLORIDE 0.9 % IV SOLN
420.0000 mg | Freq: Once | INTRAVENOUS | Status: AC
  Administered 2013-12-25: 420 mg via INTRAVENOUS
  Filled 2013-12-25: qty 14

## 2013-12-25 NOTE — Patient Instructions (Signed)

## 2013-12-25 NOTE — Telephone Encounter (Signed)
, °

## 2013-12-25 NOTE — Progress Notes (Signed)
ID: Tina [name] OB: 15-Jun-1961  MR#: [id number]  CSN#:[id number]  PCP: [name], DO GYN:   SU:  OTHER MD:  CHIEF COMPLAINT:Veleta Alroy Dust 53 y.o. [place], [place] woman with clinical stage T2Nx IIA invasive ductal carcinoma, grade 3, ER positive, HER-2/neu positive invasive ductal carcinoma of the right breast   BREAST CANCER HISTORY:  1. Underwent screening mammogram. On the right side she was noted to have a mass with microcalcifications. Ultrasound in February 2015 showed a 1.2 x 1.1 x 1.0 cm rounded hypoechoic mass with irregular margins in the 8:00 position 4 cm from the nipple. No abnormal axillary lymph nodes were noted. She had MRI of the breasts performed that showed in the lower outer right breast 2.2 x 1.3 x 1.5 cm Irregular mass. In the upper outer left breast there was a 7 x 5 x 8 mm indeterminate enhancing mass. Also another 9 x 7 x 6 mm indeterminant enhancing mass at 9:00 in the left breast was noted.there are also noted to be ominent cortices of right axillary lymph nodes. Patient has undergone biopsy of these. On the right patient had biopsy performed of the 1.2 cm mass. The pathology revealed grade 3 invasive ductal carcinoma with ductal carcinoma in situ. Tumor was ER +70% PR +6% HER-2/neu was amplified and proliferation marker Ki-67 70%   2. Referred to genetic counseling: testing was negative.    CURRENT THERAPY: Neoadjuvant Docetaxel, Carboplatin, Trastuzumab and Pertuzumab cycle 5 day 1  INTERVAL HISTORY:  Tina [name] is here today for evaluation following her fourth cycle of neoadjuvant Docetaxel, Carboplatin, Trastuzumab and Pertuzumab.  She receives this treatment on day 1 of a 21 day cycle with Neulasta given for granulocyte support.  She is day 1 of cycle 5.  She typically receives IV fluids and IV Zofran on the Saturday, Monday, and Tuesday following treatment due to her nausea and decreased PO intake following chemotherapy.    She is doing moderately well  today.  She is c/o of an increase in swelling and fluid overload.  She had a 10 pound weight gain since her last appointment. She has increased her Zestoretic to one pill twice a day and has had an increase in urination, and has lost 3 pounds of fluids in the past 3 days.  Today she is 186 pounds and at her last appointment she was 181 pounds.  She cannot tell whether or not the neuropathy is improving due to the fluid.  Otherwise, she denies fevers, chills, nausea, vomiting, constipation, diarrhea, or any further concerns.   REVIEW OF SYSTEMS:  A 10 point review of systems was conducted and is otherwise negative except for what is noted above.     PAST MEDICAL HISTORY: Past Medical History  Diagnosis Date  . Leukocytopenia   . Hypertension   . Degenerative joint disease   . Leukopenia 06/16/2013  . Needs flu shot 06/17/2013  . Cancer of right breast 09/03/13    Invasive Ductal Carcinoma/Ductal Carcinoma Insitu    PAST SURGICAL HISTORY: Past Surgical History  Procedure Laterality Date  . Cesarean section    . Carpal tunnel release    . Trigger finger release      FAMILY HISTORY Family History  Problem Relation Age of Onset  . Hypertension Mother   . Dementia Mother     small vessel disease  . Cancer Mother 30    cervical  cancer  . Cancer Father 21    prostate ca  . Diabetes Father   .  Arthritis Father     GYNECOLOGIC HISTORY: menarche at age 53, G33 P3, no h/o abnormal pap smears, LMP 09/2013 (on Zoladex), has h/o chlamydia at age 49. Used progesterone cream x 1 month.      SOCIAL HISTORY: Lives with husband of 3 years, Kennidi Yoshida, in a 2 story house.  Drinks ETOH occasionally, has 10 pack year tobacco history, no illicit drug use.      ADVANCED DIRECTIVES: not in place   HEALTH MAINTENANCE: History  Substance Use Topics  . Smoking status: Former Smoker -- 1.00 packs/day for 15 years    Types: Cigars, Cigarettes  . Smokeless tobacco: Never Used     Comment:  remote smoking history  . Alcohol Use: 0.0 oz/week    0 Shots of liquor per week     Comment: occ     Mammogram: 08/2013 Colonoscopy: not yet Bone Density Scan: n/a Pap Smear:  06/25/2012 Eye Exam: 03/2013 Vitamin D Level:  n/a Lipid Panel: 04/2013   Allergies  Allergen Reactions  . Adhesive [Tape]   . Nickel     Current Outpatient Prescriptions  Medication Sig Dispense Refill  . ascorbic acid (VITAMIN C) 1000 MG tablet Take 1,000 mg by mouth daily.      Marland Kitchen aspirin 81 MG tablet Take 81 mg by mouth daily.      Marland Kitchen BIOTIN 5000 PO Take by mouth daily.       . Calcium Carbonate-Vit D-Min (CALTRATE 600+D PLUS MINERALS PO) Take 1 tablet by mouth daily.      . Cholecalciferol (VITAMIN D3) 10000 UNITS capsule Take 10,000 Units by mouth daily.      . Coenzyme Q10 (CO Q 10 PO) Take by mouth daily.      Marland Kitchen dexamethasone (DECADRON) 4 MG tablet Take 2 tablets (8 mg total) by mouth 2 (two) times daily. Start the day before Taxotere. Then again the day after chemo for 3 days.  30 tablet  1  . gabapentin (NEURONTIN) 100 MG capsule Take 3 capsules (300 mg total) by mouth 3 (three) times daily.  270 capsule  2  . lidocaine-prilocaine (EMLA) cream Apply topically as needed.  30 g  6  . lisinopril-hydrochlorothiazide (PRINZIDE,ZESTORETIC) 20-12.5 MG per tablet Take 2 tablets by mouth daily.  120 tablet  1  . omeprazole (PRILOSEC) 40 MG capsule Take 1 capsule (40 mg total) by mouth daily.  30 capsule  6  . OVER THE COUNTER MEDICATION Take 1 tablet by mouth daily. Trace Minerals      . oxyCODONE (OXY IR/ROXICODONE) 5 MG immediate release tablet Take 1 tablet (5 mg total) by mouth every 4 (four) hours as needed for severe pain.  60 tablet  0  . Probiotic Product (TRUBIOTICS) CAPS Take 1 capsule by mouth daily.      Marland Kitchen VITAMIN E PO Take 1 capsule by mouth daily.       . Alum & Mag Hydroxide-Simeth (MAGIC MOUTHWASH) SOLN Take 5 mLs by mouth 4 (four) times daily.  100 mL  2  . L-ARGININE PO Take by mouth  daily.      Marland Kitchen LORazepam (ATIVAN) 0.5 MG tablet Take 1 tablet (0.5 mg total) by mouth every 6 (six) hours as needed (Nausea or vomiting).  30 tablet  0  . nystatin (MYCOSTATIN) 100000 UNIT/ML suspension Take 5 mLs (500,000 Units total) by mouth 4 (four) times daily.  60 mL  0  . ondansetron (ZOFRAN) 8 MG tablet Take 1 tablet (8 mg total) by  mouth 2 (two) times daily. Start the day after chemo for 3 days. Then take as needed for nausea or vomiting.  30 tablet  1  . potassium chloride SA (K-DUR,KLOR-CON) 20 MEQ tablet Take 1 tablet (20 mEq total) by mouth once. X 3 days, then stop  5 tablet  0  . prochlorperazine (COMPAZINE) 10 MG tablet Take 1 tablet (10 mg total) by mouth every 6 (six) hours as needed (Nausea or vomiting).  30 tablet  1   No current facility-administered medications for this visit.    OBJECTIVE: Filed Vitals:   12/25/13 0851  BP: 162/98  Pulse: 102  Temp: 98.3 F (36.8 C)  Resp: 20     Body mass index is 31.96 kg/(m^2).     GENERAL: Patient is a well appearing female in no acute distress HEENT:  Sclerae anicteric.  Oropharynx clear and moist. No ulcerations or evidence of oropharyngeal candidiasis. Neck is supple.  NODES:  No cervical, supraclavicular, or axillary lymphadenopathy palpated.  BREAST EXAM:  Unable to palpate a right breast mass LUNGS:  Clear to auscultation bilaterally.  No wheezes or rhonchi. HEART:  Regular rate and rhythm. No murmur appreciated. ABDOMEN:  Soft, nontender.  Positive, normoactive bowel sounds. No organomegaly palpated. MSK:  No focal spinal tenderness to palpation. Full range of motion bilaterally in the upper extremities. EXTREMITIES:  No peripheral edema.   SKIN:  Clear with no obvious rashes or skin changes. No nail dyscrasia. NEURO:  Nonfocal. Well oriented.  Appropriate affect. ECOG FS:1 - Symptomatic but completely ambulatory  LAB RESULTS:  CMP     Component Value Date/Time   NA 142 12/25/2013 0836   NA 138 09/25/2013 1320   K  3.4* 12/25/2013 0836   K 3.6* 09/25/2013 1320   CL 98 09/25/2013 1320   CL 104 06/16/2012 1303   CO2 24 12/25/2013 0836   CO2 27 09/25/2013 1320   GLUCOSE 176* 12/25/2013 0836   GLUCOSE 82 09/25/2013 1320   GLUCOSE 85 06/16/2012 1303   BUN 14.7 12/25/2013 0836   BUN 9 09/25/2013 1320   CREATININE 0.9 12/25/2013 0836   CREATININE 0.82 09/25/2013 1320   CALCIUM 9.7 12/25/2013 0836   CALCIUM 10.0 09/25/2013 1320   PROT 7.2 12/25/2013 0836   PROT 8.7* 10/04/2012 1830   ALBUMIN 3.8 12/25/2013 0836   ALBUMIN 4.1 10/04/2012 1830   AST 22 12/25/2013 0836   AST 32 10/04/2012 1830   ALT 26 12/25/2013 0836   ALT 27 10/04/2012 1830   ALKPHOS 49 12/25/2013 0836   ALKPHOS 42 10/04/2012 1830   BILITOT 0.57 12/25/2013 0836   BILITOT 0.7 10/04/2012 1830   GFRNONAA 82* 09/25/2013 1320   GFRAA >90 09/25/2013 1320    I No results found for this basename: SPEP,  UPEP,   kappa and lambda light chains    Lab Results  Component Value Date   WBC 7.9 12/25/2013   NEUTROABS 6.8* 12/25/2013   HGB 11.0* 12/25/2013   HCT 32.6* 12/25/2013   MCV 100.0 12/25/2013   PLT 355 12/25/2013      Chemistry      Component Value Date/Time   NA 142 12/25/2013 0836   NA 138 09/25/2013 1320   K 3.4* 12/25/2013 0836   K 3.6* 09/25/2013 1320   CL 98 09/25/2013 1320   CL 104 06/16/2012 1303   CO2 24 12/25/2013 0836   CO2 27 09/25/2013 1320   BUN 14.7 12/25/2013 0836   BUN 9 09/25/2013 1320  CREATININE 0.9 12/25/2013 0836   CREATININE 0.82 09/25/2013 1320      Component Value Date/Time   CALCIUM 9.7 12/25/2013 0836   CALCIUM 10.0 09/25/2013 1320   ALKPHOS 49 12/25/2013 0836   ALKPHOS 42 10/04/2012 1830   AST 22 12/25/2013 0836   AST 32 10/04/2012 1830   ALT 26 12/25/2013 0836   ALT 27 10/04/2012 1830   BILITOT 0.57 12/25/2013 0836   BILITOT 0.7 10/04/2012 1830       No results found for this basename: LABCA2    No components found with this basename: LABCA125    No results found for this basename: INR,  in the last 168 hours  Urinalysis     Component Value Date/Time   LABSPEC 1.005 10/09/2013 1646   GLUCOSEU Negative 10/09/2013 1646   UROBILINOGEN 0.2 10/09/2013 1646    STUDIES: No results found.  ASSESSMENT: 54 y.o. [place], [place] woman with clinical stage T2Nx IIA invasive ductal carcinoma, grade 3, ER positive, HER-2/neu positive invasive ductal carcinoma of the right breast.   1.  Patient was started on neoadjuvant Docetaxel, Carboplatin, Trastuzumab, Pertuzumab that started on 10/02/13. She receives treatment on day 1 of a 21 day cycle with Neulasta given on day 2 for granulocyte support. The patient received 4 cycles and due to fluid retention and neuropathy the Docetaxel was discontinued.  Gemcitabine was given at 886m/m2 instead, with no other changes made to her chemotherapy regimen.    PLAN:   Mrs. MJayeis doing well today.  Her labs are stable.  Due to the neuropathy and fluid retention she will not receive any further Docetaxel.  She will receive Gemcitabine today in addition to Carboplatin, Trastuzumab, and Pertuzumab.  I reviewed her new regimen with her in detail, and gave her detailed information about Gemcitabine.    Cardiac: Her last echocardiogram on 11/30/2013 demonstrated a LVEF of 65-70%. She was evaluated by Dr. BVaughan Browneron 11/30/13 in the cardio-onc clinic. He reviewed her echo, and cleared her to continue with Herceptin. 3 month f/u with echocardiogram was recommended.    She knows to call [facility] the interim for any questions or concerns.  We can certainly see her sooner if needed.  She will return for Neulasta tomorrow, and I will see her next week for labs and evaluation.  I spent 25 minutes counseling the patient face to face.  The total time spent in the appointment was 30 minutes.  LMinette Headland [phone]   12/25/2013 9:33 AM

## 2013-12-25 NOTE — Telephone Encounter (Signed)
Called pt to go over instructions concerning her anti-nausea med. Told pt she still need to take Decadron the days after her treatments as before and she needs to come in for her Neulasta on tomorrow. I also told pt we will schedule Zoladex inj for January 11, 2014. Pt verbalized understanding. Message to be forwarded to Charlestine Massed, NP.

## 2013-12-25 NOTE — Patient Instructions (Addendum)
[facility] Discharge Instructions for Patients Receiving Chemotherapy  Today you received the following chemotherapy agents Herceptin/Perjeta/Gemzar/Carboplatin To help prevent nausea and vomiting after your treatment, we encourage you to take your nausea medication as prescribed. If you develop nausea and vomiting that is not controlled by your nausea medication, call the clinic.   BELOW ARE SYMPTOMS THAT SHOULD BE REPORTED IMMEDIATELY:  *FEVER GREATER THAN 100.5 F  *CHILLS WITH OR WITHOUT FEVER  NAUSEA AND VOMITING THAT IS NOT CONTROLLED WITH YOUR NAUSEA MEDICATION  *UNUSUAL SHORTNESS OF BREATH  *UNUSUAL BRUISING OR BLEEDING  TENDERNESS IN MOUTH AND THROAT WITH OR WITHOUT PRESENCE OF ULCERS  *URINARY PROBLEMS  *BOWEL PROBLEMS  UNUSUAL RASH Items with * indicate a potential emergency and should be followed up as soon as possible.  Feel free to call the clinic you have any questions or concerns. The clinic phone number is [phone].

## 2013-12-25 NOTE — Telephone Encounter (Signed)
Per staff message and POF I have scheduled appts.  JMW  

## 2013-12-26 ENCOUNTER — Ambulatory Visit (HOSPITAL_BASED_OUTPATIENT_CLINIC_OR_DEPARTMENT_OTHER): Payer: BC Managed Care – PPO

## 2013-12-26 VITALS — BP 150/86 | HR 98 | Temp 98.7°F | Resp 18

## 2013-12-26 DIAGNOSIS — R110 Nausea: Secondary | ICD-10-CM

## 2013-12-26 DIAGNOSIS — Z5189 Encounter for other specified aftercare: Secondary | ICD-10-CM

## 2013-12-26 MED ORDER — PROCHLORPERAZINE EDISYLATE 5 MG/ML IJ SOLN
10.0000 mg | Freq: Four times a day (QID) | INTRAMUSCULAR | Status: DC | PRN
  Administered 2013-12-26: 10 mg via INTRAVENOUS

## 2013-12-26 MED ORDER — SODIUM CHLORIDE 0.9 % IV SOLN
INTRAVENOUS | Status: DC
  Administered 2013-12-26: 09:00:00 via INTRAVENOUS

## 2013-12-26 MED ORDER — PROCHLORPERAZINE EDISYLATE 5 MG/ML IJ SOLN
INTRAMUSCULAR | Status: AC
  Filled 2013-12-26: qty 2

## 2013-12-26 MED ORDER — PEGFILGRASTIM INJECTION 6 MG/0.6ML
6.0000 mg | Freq: Once | SUBCUTANEOUS | Status: AC
  Administered 2013-12-26: 6 mg via SUBCUTANEOUS
  Filled 2013-12-26: qty 0.6

## 2013-12-28 ENCOUNTER — Ambulatory Visit (HOSPITAL_BASED_OUTPATIENT_CLINIC_OR_DEPARTMENT_OTHER): Payer: BC Managed Care – PPO

## 2013-12-28 ENCOUNTER — Telehealth: Payer: Self-pay | Admitting: Oncology

## 2013-12-28 VITALS — BP 126/82 | HR 89 | Temp 97.1°F

## 2013-12-28 DIAGNOSIS — C50519 Malignant neoplasm of lower-outer quadrant of unspecified female breast: Secondary | ICD-10-CM

## 2013-12-28 DIAGNOSIS — C50919 Malignant neoplasm of unspecified site of unspecified female breast: Secondary | ICD-10-CM

## 2013-12-28 DIAGNOSIS — R110 Nausea: Secondary | ICD-10-CM

## 2013-12-28 MED ORDER — ONDANSETRON 8 MG/50ML IVPB (CHCC)
8.0000 mg | Freq: Once | INTRAVENOUS | Status: AC
  Administered 2013-12-28: 8 mg via INTRAVENOUS

## 2013-12-28 MED ORDER — SODIUM CHLORIDE 0.9 % IJ SOLN
10.0000 mL | INTRAMUSCULAR | Status: DC | PRN
  Administered 2013-12-28: 10 mL via INTRAVENOUS
  Filled 2013-12-28: qty 10

## 2013-12-28 MED ORDER — ONDANSETRON 8 MG/NS 50 ML IVPB
INTRAVENOUS | Status: AC
  Filled 2013-12-28: qty 8

## 2013-12-28 MED ORDER — HEPARIN SOD (PORK) LOCK FLUSH 100 UNIT/ML IV SOLN
500.0000 [IU] | Freq: Once | INTRAVENOUS | Status: AC
  Administered 2013-12-28: 500 [IU] via INTRAVENOUS
  Filled 2013-12-28: qty 5

## 2013-12-28 MED ORDER — SODIUM CHLORIDE 0.9 % IV SOLN
Freq: Once | INTRAVENOUS | Status: AC
  Administered 2013-12-28: 09:00:00 via INTRAVENOUS

## 2013-12-28 NOTE — Telephone Encounter (Signed)
S/w the pt and she is aware of her injection on 01/11/2014/

## 2013-12-28 NOTE — Patient Instructions (Signed)
Dehydration, Adult Dehydration is when you lose more fluids from the body than you take in. Vital organs like the kidneys, brain, and heart cannot function without a proper amount of fluids and salt. Any loss of fluids from the body can cause dehydration.  CAUSES   Vomiting.  Diarrhea.  Excessive sweating.  Excessive urine output.  Fever. SYMPTOMS  Mild dehydration  Thirst.  Dry lips.  Slightly dry mouth. Moderate dehydration  Very dry mouth.  Sunken eyes.  Skin does not bounce back quickly when lightly pinched and released.  Dark urine and decreased urine production.  Decreased tear production.  Headache. Severe dehydration  Very dry mouth.  Extreme thirst.  Rapid, weak pulse (more than 100 beats per minute at rest).  Cold hands and feet.  Not able to sweat in spite of heat and temperature.  Rapid breathing.  Blue lips.  Confusion and lethargy.  Difficulty being awakened.  Minimal urine production.  No tears. DIAGNOSIS  Your caregiver will diagnose dehydration based on your symptoms and your exam. Blood and urine tests will help confirm the diagnosis. The diagnostic evaluation should also identify the cause of dehydration. TREATMENT  Treatment of mild or moderate dehydration can often be done at home by increasing the amount of fluids that you drink. It is best to drink small amounts of fluid more often. Drinking too much at one time can make vomiting worse. Refer to the home care instructions below. Severe dehydration needs to be treated at the hospital where you will probably be given intravenous (IV) fluids that contain water and electrolytes. HOME CARE INSTRUCTIONS   Ask your caregiver about specific rehydration instructions.  Drink enough fluids to keep your urine clear or pale yellow.  Drink small amounts frequently if you have nausea and vomiting.  Eat as you normally do.  Avoid:  Foods or drinks high in sugar.  Carbonated  drinks.  Juice.  Extremely hot or cold fluids.  Drinks with caffeine.  Fatty, greasy foods.  Alcohol.  Tobacco.  Overeating.  Gelatin desserts.  Wash your hands well to avoid spreading bacteria and viruses.  Only take over-the-counter or prescription medicines for pain, discomfort, or fever as directed by your caregiver.  Ask your caregiver if you should continue all prescribed and over-the-counter medicines.  Keep all follow-up appointments with your caregiver. SEEK MEDICAL CARE IF:  You have abdominal pain and it increases or stays in one area (localizes).  You have a rash, stiff neck, or severe headache.  You are irritable, sleepy, or difficult to awaken.  You are weak, dizzy, or extremely thirsty. SEEK IMMEDIATE MEDICAL CARE IF:   You are unable to keep fluids down or you get worse despite treatment.  You have frequent episodes of vomiting or diarrhea.  You have blood or green matter (bile) in your vomit.  You have blood in your stool or your stool looks black and tarry.  You have not urinated in 6 to 8 hours, or you have only urinated a small amount of very dark urine.  You have a fever.  You faint. MAKE SURE YOU:   Understand these instructions.  Will watch your condition.  Will get help right away if you are not doing well or get worse. Document Released: 07/16/2005 Document Revised: 10/08/2011 Document Reviewed: 03/05/2011 Exit[employer] Patient Information 2014 [employer].  

## 2013-12-29 ENCOUNTER — Other Ambulatory Visit: Payer: Self-pay | Admitting: Adult Health

## 2013-12-29 ENCOUNTER — Ambulatory Visit (HOSPITAL_BASED_OUTPATIENT_CLINIC_OR_DEPARTMENT_OTHER): Payer: BC Managed Care – PPO

## 2013-12-29 ENCOUNTER — Other Ambulatory Visit: Payer: Self-pay | Admitting: *Deleted

## 2013-12-29 VITALS — BP 155/91 | HR 89 | Temp 98.9°F

## 2013-12-29 DIAGNOSIS — R110 Nausea: Secondary | ICD-10-CM

## 2013-12-29 DIAGNOSIS — C50519 Malignant neoplasm of lower-outer quadrant of unspecified female breast: Secondary | ICD-10-CM

## 2013-12-29 MED ORDER — HEPARIN SOD (PORK) LOCK FLUSH 100 UNIT/ML IV SOLN
500.0000 [IU] | Freq: Once | INTRAVENOUS | Status: AC | PRN
  Administered 2013-12-29: 500 [IU]
  Filled 2013-12-29: qty 5

## 2013-12-29 MED ORDER — SODIUM CHLORIDE 0.9 % IJ SOLN
10.0000 mL | INTRAMUSCULAR | Status: DC | PRN
  Administered 2013-12-29: 10 mL
  Filled 2013-12-29: qty 10

## 2013-12-29 MED ORDER — ONDANSETRON 8 MG/NS 50 ML IVPB
INTRAVENOUS | Status: AC
  Filled 2013-12-29: qty 8

## 2013-12-29 MED ORDER — SODIUM CHLORIDE 0.9 % IV SOLN
Freq: Once | INTRAVENOUS | Status: AC
  Administered 2013-12-29: 15:00:00 via INTRAVENOUS

## 2013-12-29 MED ORDER — ONDANSETRON 8 MG/50ML IVPB (CHCC)
8.0000 mg | Freq: Every day | INTRAVENOUS | Status: DC | PRN
  Administered 2013-12-29: 8 mg via INTRAVENOUS

## 2013-12-29 NOTE — Patient Instructions (Signed)

## 2014-01-01 ENCOUNTER — Other Ambulatory Visit (HOSPITAL_BASED_OUTPATIENT_CLINIC_OR_DEPARTMENT_OTHER): Payer: BC Managed Care – PPO

## 2014-01-01 ENCOUNTER — Ambulatory Visit (HOSPITAL_BASED_OUTPATIENT_CLINIC_OR_DEPARTMENT_OTHER): Payer: BC Managed Care – PPO | Admitting: Adult Health

## 2014-01-01 ENCOUNTER — Encounter: Payer: Self-pay | Admitting: Adult Health

## 2014-01-01 VITALS — BP 138/87 | HR 97 | Temp 98.7°F | Resp 18 | Ht 64.0 in | Wt 187.7 lb

## 2014-01-01 DIAGNOSIS — Z170 Estrogen receptor positive status [ER+]: Secondary | ICD-10-CM

## 2014-01-01 DIAGNOSIS — E860 Dehydration: Secondary | ICD-10-CM

## 2014-01-01 DIAGNOSIS — G609 Hereditary and idiopathic neuropathy, unspecified: Secondary | ICD-10-CM

## 2014-01-01 DIAGNOSIS — C50519 Malignant neoplasm of lower-outer quadrant of unspecified female breast: Secondary | ICD-10-CM

## 2014-01-01 LAB — CBC WITH DIFFERENTIAL/PLATELET
BASO%: 1 % (ref 0.0–2.0)
BASOS ABS: 0.1 10*3/uL (ref 0.0–0.1)
EOS%: 0.3 % (ref 0.0–7.0)
Eosinophils Absolute: 0 10*3/uL (ref 0.0–0.5)
HEMATOCRIT: 32.7 % — AB (ref 34.8–46.6)
HEMOGLOBIN: 11.3 g/dL — AB (ref 11.6–15.9)
LYMPH#: 1.8 10*3/uL (ref 0.9–3.3)
LYMPH%: 20.9 % (ref 14.0–49.7)
MCH: 34.8 pg — ABNORMAL HIGH (ref 25.1–34.0)
MCHC: 34.4 g/dL (ref 31.5–36.0)
MCV: 101.1 fL — ABNORMAL HIGH (ref 79.5–101.0)
MONO#: 0.3 10*3/uL (ref 0.1–0.9)
MONO%: 3.9 % (ref 0.0–14.0)
NEUT#: 6.4 10*3/uL (ref 1.5–6.5)
NEUT%: 73.9 % (ref 38.4–76.8)
Platelets: 172 10*3/uL (ref 145–400)
RBC: 3.24 10*6/uL — ABNORMAL LOW (ref 3.70–5.45)
RDW: 15.1 % — ABNORMAL HIGH (ref 11.2–14.5)
WBC: 8.6 10*3/uL (ref 3.9–10.3)

## 2014-01-01 LAB — COMPREHENSIVE METABOLIC PANEL (CC13)
ALBUMIN: 3.7 g/dL (ref 3.5–5.0)
ALT: 26 U/L (ref 0–55)
ANION GAP: 12 meq/L — AB (ref 3–11)
AST: 17 U/L (ref 5–34)
Alkaline Phosphatase: 111 U/L (ref 40–150)
BUN: 13.3 mg/dL (ref 7.0–26.0)
CHLORIDE: 101 meq/L (ref 98–109)
CO2: 27 mEq/L (ref 22–29)
CREATININE: 0.8 mg/dL (ref 0.6–1.1)
Calcium: 9.4 mg/dL (ref 8.4–10.4)
Glucose: 96 mg/dl (ref 70–140)
Potassium: 3.8 mEq/L (ref 3.5–5.1)
Sodium: 140 mEq/L (ref 136–145)
Total Bilirubin: 0.4 mg/dL (ref 0.20–1.20)
Total Protein: 6.9 g/dL (ref 6.4–8.3)

## 2014-01-01 MED ORDER — LORAZEPAM 0.5 MG PO TABS: 0.5000 mg | ORAL_TABLET | Freq: Four times a day (QID) | ORAL | Status: DC | PRN

## 2014-01-01 MED ORDER — PROCHLORPERAZINE MALEATE 10 MG PO TABS: 10.0000 mg | ORAL_TABLET | Freq: Four times a day (QID) | ORAL | Status: DC | PRN

## 2014-01-01 MED ORDER — DEXAMETHASONE 4 MG PO TABS: 8.0000 mg | ORAL_TABLET | Freq: Two times a day (BID) | ORAL | Status: DC

## 2014-01-01 MED ORDER — OXYCODONE HCL 5 MG PO TABS: 5.0000 mg | ORAL_TABLET | ORAL | Status: DC | PRN

## 2014-01-01 NOTE — Progress Notes (Signed)
ID: Tina [name] OB: October 07, 1960  MR#: [id number]  GTX#:[id number]  PCP: [name], DO GYN:   SU:  OTHER MD:  CHIEF COMPLAINT:Tina [name] 53 y.o. [place], [place] woman with clinical stage T2Nx IIA invasive ductal carcinoma, grade 3, ER positive, HER-2/neu positive invasive ductal carcinoma of the right breast   BREAST CANCER HISTORY:  1. Underwent screening mammogram. On the right side she was noted to have a mass with microcalcifications. Ultrasound in February 2015 showed a 1.2 x 1.1 x 1.0 cm rounded hypoechoic mass with irregular margins in the 8:00 position 4 cm from the nipple. No abnormal axillary lymph nodes were noted. She had MRI of the breasts performed that showed in the lower outer right breast 2.2 x 1.3 x 1.5 cm Irregular mass. In the upper outer left breast there was a 7 x 5 x 8 mm indeterminate enhancing mass. Also another 9 x 7 x 6 mm indeterminant enhancing mass at 9:00 in the left breast was noted.there are also noted to be ominent cortices of right axillary lymph nodes. Patient has undergone biopsy of these. On the right patient had biopsy performed of the 1.2 cm mass. The pathology revealed grade 3 invasive ductal carcinoma with ductal carcinoma in situ. Tumor was ER +70% PR +6% HER-2/neu was amplified and proliferation marker Ki-67 70%   2. Referred to genetic counseling: testing was negative.    CURRENT THERAPY: Neoadjuvant Gemcitabine, Carboplatin, Trastuzumab and Pertuzumab cycle 1 day 8  INTERVAL HISTORY:  Tina [name] is here today for evaluation following her first cycle of neoadjuvant Gemcitabine, Carboplatin, Trastuzumab and Pertuzumab.  She receives this treatment on day 1 of a 21 day cycle with Neulasta given for granulocyte support.  She is day 8 of cycle 1.    She is doing moderately well today.  She continues to have peripheral neuropthy in her feet.  She is having some back pain and would like to receive acupuncture in the future.  She has bulging discs  in her upper and lower spine.  She did have some nausea, and did well with receiving IV antiemetics and fluids following treatment.   REVIEW OF SYSTEMS:  A 10 point review of systems was conducted and is otherwise negative except for what is noted above.     PAST MEDICAL HISTORY: Past Medical History  Diagnosis Date  . Leukocytopenia   . Hypertension   . Degenerative joint disease   . Leukopenia 06/16/2013  . Needs flu shot 06/17/2013  . Cancer of right breast 09/03/13    Invasive Ductal Carcinoma/Ductal Carcinoma Insitu    PAST SURGICAL HISTORY: Past Surgical History  Procedure Laterality Date  . Cesarean section    . Carpal tunnel release    . Trigger finger release      FAMILY HISTORY Family History  Problem Relation Age of Onset  . Hypertension Mother   . Dementia Mother     small vessel disease  . Cancer Mother 30    cervical  cancer  . Cancer Father 65    prostate ca  . Diabetes Father   . Arthritis Father     GYNECOLOGIC HISTORY: menarche at age 19, G47 P3, no h/o abnormal pap smears, LMP 09/2013 (on Zoladex), has h/o chlamydia at age 49. Used progesterone cream x 1 month.      SOCIAL HISTORY: Lives with husband of 3 years, Tina [name], in a 2 story house.  Drinks ETOH occasionally, has 10 pack year tobacco history, no illicit drug use.  ADVANCED DIRECTIVES: not in place   HEALTH MAINTENANCE: History  Substance Use Topics  . Smoking status: Former Smoker -- 1.00 packs/day for 15 years    Types: Cigars, Cigarettes  . Smokeless tobacco: Never Used     Comment: remote smoking history  . Alcohol Use: 0.0 oz/week    0 Shots of liquor per week     Comment: occ     Mammogram: 08/2013 Colonoscopy: not yet Bone Density Scan: n/a Pap Smear:  06/25/2012 Eye Exam: 03/2013 Vitamin D Level:  n/a Lipid Panel: 04/2013   Allergies  Allergen Reactions  . Adhesive [Tape]   . Nickel     Current Outpatient Prescriptions  Medication Sig Dispense Refill   . ascorbic acid (VITAMIN C) 1000 MG tablet Take 1,000 mg by mouth daily.      Marland Kitchen aspirin 81 MG tablet Take 81 mg by mouth daily.      Marland Kitchen BIOTIN 5000 PO Take by mouth daily.       . Calcium Carbonate-Vit D-Min (CALTRATE 600+D PLUS MINERALS PO) Take 1 tablet by mouth daily.      . Cholecalciferol (VITAMIN D3) 10000 UNITS capsule Take 10,000 Units by mouth daily.      . Coenzyme Q10 (CO Q 10 PO) Take by mouth daily.      Marland Kitchen gabapentin (NEURONTIN) 100 MG capsule Take 3 capsules (300 mg total) by mouth 3 (three) times daily.  270 capsule  2  . L-ARGININE PO Take by mouth daily.      Marland Kitchen lisinopril-hydrochlorothiazide (PRINZIDE,ZESTORETIC) 20-12.5 MG per tablet Take 2 tablets by mouth daily.  120 tablet  1  . LORazepam (ATIVAN) 0.5 MG tablet Take 1 tablet (0.5 mg total) by mouth every 6 (six) hours as needed (Nausea or vomiting).  30 tablet  0  . omeprazole (PRILOSEC) 40 MG capsule Take 1 capsule (40 mg total) by mouth daily.  30 capsule  6  . OVER THE COUNTER MEDICATION Take 1 tablet by mouth daily. Trace Minerals      . oxyCODONE (OXY IR/ROXICODONE) 5 MG immediate release tablet Take 1 tablet (5 mg total) by mouth every 4 (four) hours as needed for severe pain.  60 tablet  0  . Probiotic Product (TRUBIOTICS) CAPS Take 1 capsule by mouth daily.      Marland Kitchen VITAMIN E PO Take 1 capsule by mouth daily.       . Alum & Mag Hydroxide-Simeth (MAGIC MOUTHWASH) SOLN Take 5 mLs by mouth 4 (four) times daily.  100 mL  2  . dexamethasone (DECADRON) 4 MG tablet Take 2 tablets (8 mg total) by mouth 2 (two) times daily. Start the day before Taxotere. Then again the day after chemo for 3 days.  30 tablet  1  . lidocaine-prilocaine (EMLA) cream Apply topically as needed.  30 g  6  . nystatin (MYCOSTATIN) 100000 UNIT/ML suspension Take 5 mLs (500,000 Units total) by mouth 4 (four) times daily.  60 mL  0  . ondansetron (ZOFRAN) 8 MG tablet Take 1 tablet (8 mg total) by mouth 2 (two) times daily. Start the day after chemo for 3  days. Then take as needed for nausea or vomiting.  30 tablet  1  . potassium chloride SA (K-DUR,KLOR-CON) 20 MEQ tablet Take 1 tablet (20 mEq total) by mouth once. X 3 days, then stop  5 tablet  0  . prochlorperazine (COMPAZINE) 10 MG tablet Take 1 tablet (10 mg total) by mouth every 6 (six) hours as  needed (Nausea or vomiting).  30 tablet  1   No current facility-administered medications for this visit.   Facility-Administered Medications Ordered in Other Visits  Medication Dose Route Frequency Provider Last Rate Last Dose  . 0.9 %  sodium chloride infusion   Intravenous Continuous Minette Headland, NP 1,000 mL/hr at 12/26/13 0915    . prochlorperazine (COMPAZINE) injection 10 mg  10 mg Intravenous Q6H PRN Minette Headland, NP   10 mg at 12/26/13 8333    OBJECTIVE: Filed Vitals:   01/01/14 1059  BP: 138/87  Pulse: 97  Temp: 98.7 F (37.1 C)  Resp: 18     Body mass index is 32.2 kg/(m^2).     GENERAL: Patient is a well appearing female in no acute distress HEENT:  Sclerae anicteric.  Oropharynx clear and moist. No ulcerations or evidence of oropharyngeal candidiasis. Neck is supple.  NODES:  No cervical, supraclavicular, or axillary lymphadenopathy palpated.  BREAST EXAM:  Unable to palpate a right breast mass LUNGS:  Clear to auscultation bilaterally.  No wheezes or rhonchi. HEART:  Regular rate and rhythm. No murmur appreciated. ABDOMEN:  Soft, nontender.  Positive, normoactive bowel sounds. No organomegaly palpated. MSK:  No focal spinal tenderness to palpation. Full range of motion bilaterally in the upper extremities. EXTREMITIES:  No peripheral edema.   SKIN:  Clear with no obvious rashes or skin changes. No nail dyscrasia. NEURO:  Nonfocal. Well oriented.  Appropriate affect. ECOG FS:1 - Symptomatic but completely ambulatory  LAB RESULTS:  CMP     Component Value Date/Time   NA 140 01/01/2014 1050   NA 138 09/25/2013 1320   K 3.8 01/01/2014 1050   K 3.6* 09/25/2013 1320    CL 98 09/25/2013 1320   CL 104 06/16/2012 1303   CO2 27 01/01/2014 1050   CO2 27 09/25/2013 1320   GLUCOSE 96 01/01/2014 1050   GLUCOSE 82 09/25/2013 1320   GLUCOSE 85 06/16/2012 1303   BUN 13.3 01/01/2014 1050   BUN 9 09/25/2013 1320   CREATININE 0.8 01/01/2014 1050   CREATININE 0.82 09/25/2013 1320   CALCIUM 9.4 01/01/2014 1050   CALCIUM 10.0 09/25/2013 1320   PROT 6.9 01/01/2014 1050   PROT 8.7* 10/04/2012 1830   ALBUMIN 3.7 01/01/2014 1050   ALBUMIN 4.1 10/04/2012 1830   AST 17 01/01/2014 1050   AST 32 10/04/2012 1830   ALT 26 01/01/2014 1050   ALT 27 10/04/2012 1830   ALKPHOS 111 01/01/2014 1050   ALKPHOS 42 10/04/2012 1830   BILITOT 0.40 01/01/2014 1050   BILITOT 0.7 10/04/2012 1830   GFRNONAA 82* 09/25/2013 1320   GFRAA >90 09/25/2013 1320    I No results found for this basename: SPEP,  UPEP,   kappa and lambda light chains    Lab Results  Component Value Date   WBC 8.6 01/01/2014   NEUTROABS 6.4 01/01/2014   HGB 11.3* 01/01/2014   HCT 32.7* 01/01/2014   MCV 101.1* 01/01/2014   PLT 172 01/01/2014      Chemistry      Component Value Date/Time   NA 140 01/01/2014 1050   NA 138 09/25/2013 1320   K 3.8 01/01/2014 1050   K 3.6* 09/25/2013 1320   CL 98 09/25/2013 1320   CL 104 06/16/2012 1303   CO2 27 01/01/2014 1050   CO2 27 09/25/2013 1320   BUN 13.3 01/01/2014 1050   BUN 9 09/25/2013 1320   CREATININE 0.8 01/01/2014 1050   CREATININE 0.82 09/25/2013  1320      Component Value Date/Time   CALCIUM 9.4 01/01/2014 1050   CALCIUM 10.0 09/25/2013 1320   ALKPHOS 111 01/01/2014 1050   ALKPHOS 42 10/04/2012 1830   AST 17 01/01/2014 1050   AST 32 10/04/2012 1830   ALT 26 01/01/2014 1050   ALT 27 10/04/2012 1830   BILITOT 0.40 01/01/2014 1050   BILITOT 0.7 10/04/2012 1830       No results found for this basename: LABCA2    No components found with this basename: LABCA125    No results found for this basename: INR,  in the last 168 hours  Urinalysis    Component Value Date/Time   LABSPEC 1.005 10/09/2013 1646   GLUCOSEU  Negative 10/09/2013 1646   UROBILINOGEN 0.2 10/09/2013 1646    STUDIES: No results found.  ASSESSMENT: 53 y.o. [place], [place] woman with clinical stage T2Nx IIA invasive ductal carcinoma, grade 3, ER positive, HER-2/neu positive invasive ductal carcinoma of the right breast.   1.  Patient was started on neoadjuvant Docetaxel, Carboplatin, Trastuzumab, Pertuzumab that started on 10/02/13. She receives treatment on day 1 of a 21 day cycle with Neulasta given on day 2 for granulocyte support. The patient received 4 cycles and due to fluid retention and neuropathy the Docetaxel was discontinued.  Gemcitabine was given at 840m/m2 instead, with no other changes made to her chemotherapy regimen.    PLAN:  Ms. MAbellais doing well today.  She tolerated chemotherapy relatively well.  Her labs are stable,  I reviewed them with her in detail.  I did recommend that she take Gabapentin 3061min the morning and afternoon and 40026mn the evening.    Mrs. MitSlabaugh doing well today.  Her labs are stable. I reviewed them with her in detail.    Cardiac: Her last echocardiogram on 11/30/2013 demonstrated a LVEF of 65-70%. She was evaluated by Dr. BenVaughan Browner 11/30/13 in the cardio-onc clinic. He reviewed her echo, and cleared her to continue with Herceptin. 3 month f/u with echocardiogram was recommended.    She will return on 01/18/14 for labs, evaluation, and her final chemotherapy.  MRI of her breasts is on 01/26/14, and f/u with Dr. WakDonne Hazel on 02/02/14.  She knows to call us [place] the interim for any questions or concerns.  We can certainly see her sooner if needed.    I spent 15 minutes counseling the patient face to face.  The total time spent in the appointment was 30 minutes.  LinMinette HeadlandP [id number] [phone] 12:08 PM

## 2014-01-04 ENCOUNTER — Ambulatory Visit (HOSPITAL_COMMUNITY)
Admission: RE | Admit: 2014-01-04 | Discharge: 2014-01-04 | Disposition: A | Discharge status: Home / Self Care | Payer: BC Managed Care – PPO | Source: Ambulatory Visit | Attending: Obstetrics | Admitting: Obstetrics

## 2014-01-04 DIAGNOSIS — N839 Noninflammatory disorder of ovary, fallopian tube and broad ligament, unspecified: Secondary | ICD-10-CM | POA: Insufficient documentation

## 2014-01-04 DIAGNOSIS — D259 Leiomyoma of uterus, unspecified: Secondary | ICD-10-CM | POA: Insufficient documentation

## 2014-01-04 DIAGNOSIS — N949 Unspecified condition associated with female genital organs and menstrual cycle: Secondary | ICD-10-CM | POA: Insufficient documentation

## 2014-01-04 DIAGNOSIS — C50919 Malignant neoplasm of unspecified site of unspecified female breast: Secondary | ICD-10-CM | POA: Insufficient documentation

## 2014-01-04 DIAGNOSIS — Z9221 Personal history of antineoplastic chemotherapy: Secondary | ICD-10-CM | POA: Insufficient documentation

## 2014-01-05 ENCOUNTER — Encounter: Payer: Self-pay | Admitting: Oncology

## 2014-01-05 NOTE — Progress Notes (Signed)
Insurance is paying for neulasta 100%.

## 2014-01-08 ENCOUNTER — Encounter: Payer: Self-pay | Admitting: Oncology

## 2014-01-08 NOTE — Progress Notes (Signed)
Insurance is paying Neulasta 100% °

## 2014-01-11 ENCOUNTER — Ambulatory Visit (HOSPITAL_BASED_OUTPATIENT_CLINIC_OR_DEPARTMENT_OTHER): Payer: BC Managed Care – PPO

## 2014-01-11 VITALS — BP 130/90 | HR 101 | Temp 99.4°F

## 2014-01-11 DIAGNOSIS — Z5111 Encounter for antineoplastic chemotherapy: Secondary | ICD-10-CM

## 2014-01-11 DIAGNOSIS — C50519 Malignant neoplasm of lower-outer quadrant of unspecified female breast: Secondary | ICD-10-CM

## 2014-01-11 MED ORDER — GOSERELIN ACETATE 3.6 MG ~~LOC~~ IMPL
3.6000 mg | DRUG_IMPLANT | Freq: Once | SUBCUTANEOUS | Status: AC
  Administered 2014-01-11: 3.6 mg via SUBCUTANEOUS
  Filled 2014-01-11: qty 3.6

## 2014-01-18 ENCOUNTER — Other Ambulatory Visit: Payer: Self-pay | Admitting: Medical Oncology

## 2014-01-18 ENCOUNTER — Telehealth: Payer: Self-pay | Admitting: *Deleted

## 2014-01-18 ENCOUNTER — Telehealth: Payer: Self-pay | Admitting: Adult Health

## 2014-01-18 ENCOUNTER — Ambulatory Visit (HOSPITAL_BASED_OUTPATIENT_CLINIC_OR_DEPARTMENT_OTHER): Payer: BC Managed Care – PPO

## 2014-01-18 ENCOUNTER — Ambulatory Visit (HOSPITAL_BASED_OUTPATIENT_CLINIC_OR_DEPARTMENT_OTHER): Payer: BC Managed Care – PPO | Admitting: Adult Health

## 2014-01-18 ENCOUNTER — Other Ambulatory Visit (HOSPITAL_BASED_OUTPATIENT_CLINIC_OR_DEPARTMENT_OTHER): Payer: BC Managed Care – PPO

## 2014-01-18 ENCOUNTER — Other Ambulatory Visit: Payer: Self-pay | Admitting: *Deleted

## 2014-01-18 VITALS — BP 169/99 | HR 111 | Temp 98.9°F | Resp 20 | Ht 64.0 in | Wt 192.5 lb

## 2014-01-18 DIAGNOSIS — Z5112 Encounter for antineoplastic immunotherapy: Secondary | ICD-10-CM

## 2014-01-18 DIAGNOSIS — C50519 Malignant neoplasm of lower-outer quadrant of unspecified female breast: Secondary | ICD-10-CM

## 2014-01-18 DIAGNOSIS — Z5111 Encounter for antineoplastic chemotherapy: Secondary | ICD-10-CM

## 2014-01-18 DIAGNOSIS — Z170 Estrogen receptor positive status [ER+]: Secondary | ICD-10-CM

## 2014-01-18 DIAGNOSIS — C50512 Malignant neoplasm of lower-outer quadrant of left female breast: Secondary | ICD-10-CM

## 2014-01-18 DIAGNOSIS — C50919 Malignant neoplasm of unspecified site of unspecified female breast: Secondary | ICD-10-CM

## 2014-01-18 DIAGNOSIS — G609 Hereditary and idiopathic neuropathy, unspecified: Secondary | ICD-10-CM

## 2014-01-18 LAB — CBC WITH DIFFERENTIAL/PLATELET
BASO%: 0.4 % (ref 0.0–2.0)
Basophils Absolute: 0 10*3/uL (ref 0.0–0.1)
EOS ABS: 0 10*3/uL (ref 0.0–0.5)
EOS%: 1.1 % (ref 0.0–7.0)
HEMATOCRIT: 31 % — AB (ref 34.8–46.6)
HGB: 10.3 g/dL — ABNORMAL LOW (ref 11.6–15.9)
LYMPH%: 33.6 % (ref 14.0–49.7)
MCH: 33.8 pg (ref 25.1–34.0)
MCHC: 33.2 g/dL (ref 31.5–36.0)
MCV: 101.6 fL — ABNORMAL HIGH (ref 79.5–101.0)
MONO#: 0.3 10*3/uL (ref 0.1–0.9)
MONO%: 10.9 % (ref 0.0–14.0)
NEUT#: 1.5 10*3/uL (ref 1.5–6.5)
NEUT%: 54 % (ref 38.4–76.8)
PLATELETS: 325 10*3/uL (ref 145–400)
RBC: 3.05 10*6/uL — ABNORMAL LOW (ref 3.70–5.45)
RDW: 14.8 % — ABNORMAL HIGH (ref 11.2–14.5)
WBC: 2.7 10*3/uL — AB (ref 3.9–10.3)
lymph#: 0.9 10*3/uL (ref 0.9–3.3)

## 2014-01-18 LAB — COMPREHENSIVE METABOLIC PANEL (CC13)
ALT: 23 U/L (ref 0–55)
AST: 26 U/L (ref 5–34)
Albumin: 3.6 g/dL (ref 3.5–5.0)
Alkaline Phosphatase: 48 U/L (ref 40–150)
Anion Gap: 8 mEq/L (ref 3–11)
BILIRUBIN TOTAL: 0.55 mg/dL (ref 0.20–1.20)
BUN: 13.5 mg/dL (ref 7.0–26.0)
CO2: 30 mEq/L — ABNORMAL HIGH (ref 22–29)
CREATININE: 1 mg/dL (ref 0.6–1.1)
Calcium: 9.7 mg/dL (ref 8.4–10.4)
Chloride: 106 mEq/L (ref 98–109)
GLUCOSE: 135 mg/dL (ref 70–140)
Potassium: 3.7 mEq/L (ref 3.5–5.1)
Sodium: 144 mEq/L (ref 136–145)
Total Protein: 7 g/dL (ref 6.4–8.3)

## 2014-01-18 MED ORDER — PALONOSETRON HCL INJECTION 0.25 MG/5ML
INTRAVENOUS | Status: AC
  Filled 2014-01-18: qty 5

## 2014-01-18 MED ORDER — PALONOSETRON HCL INJECTION 0.25 MG/5ML
0.2500 mg | Freq: Once | INTRAVENOUS | Status: AC
  Administered 2014-01-18: 0.25 mg via INTRAVENOUS

## 2014-01-18 MED ORDER — HEPARIN SOD (PORK) LOCK FLUSH 100 UNIT/ML IV SOLN
500.0000 [IU] | Freq: Once | INTRAVENOUS | Status: AC | PRN
  Administered 2014-01-18: 500 [IU]
  Filled 2014-01-18: qty 5

## 2014-01-18 MED ORDER — TRASTUZUMAB CHEMO INJECTION 440 MG
525.0000 mg | Freq: Once | INTRAVENOUS | Status: AC
  Administered 2014-01-18: 525 mg via INTRAVENOUS
  Filled 2014-01-18: qty 25

## 2014-01-18 MED ORDER — SODIUM CHLORIDE 0.9 % IV SOLN
610.0000 mg | Freq: Once | INTRAVENOUS | Status: AC
  Administered 2014-01-18: 610 mg via INTRAVENOUS
  Filled 2014-01-18: qty 61

## 2014-01-18 MED ORDER — DIPHENHYDRAMINE HCL 25 MG PO CAPS
50.0000 mg | ORAL_CAPSULE | Freq: Once | ORAL | Status: AC
  Administered 2014-01-18: 50 mg via ORAL

## 2014-01-18 MED ORDER — DIPHENHYDRAMINE HCL 25 MG PO CAPS
ORAL_CAPSULE | ORAL | Status: AC
  Filled 2014-01-18: qty 2

## 2014-01-18 MED ORDER — SODIUM CHLORIDE 0.9 % IV SOLN
150.0000 mg | Freq: Once | INTRAVENOUS | Status: AC
  Administered 2014-01-18: 150 mg via INTRAVENOUS
  Filled 2014-01-18: qty 5

## 2014-01-18 MED ORDER — LORAZEPAM 2 MG/ML IJ SOLN
INTRAMUSCULAR | Status: AC
Start: 2014-01-18 — End: 2014-01-18
  Filled 2014-01-18: qty 1

## 2014-01-18 MED ORDER — ACETAMINOPHEN 325 MG PO TABS
650.0000 mg | ORAL_TABLET | Freq: Once | ORAL | Status: AC
  Administered 2014-01-18: 650 mg via ORAL

## 2014-01-18 MED ORDER — ACETAMINOPHEN 325 MG PO TABS
ORAL_TABLET | ORAL | Status: AC
  Filled 2014-01-18: qty 2

## 2014-01-18 MED ORDER — PERTUZUMAB CHEMO INJECTION 420 MG/14ML
420.0000 mg | Freq: Once | INTRAVENOUS | Status: AC
  Administered 2014-01-18: 420 mg via INTRAVENOUS
  Filled 2014-01-18: qty 14

## 2014-01-18 MED ORDER — DEXAMETHASONE SODIUM PHOSPHATE 20 MG/5ML IJ SOLN
12.0000 mg | Freq: Once | INTRAMUSCULAR | Status: AC
  Administered 2014-01-18: 12 mg via INTRAVENOUS

## 2014-01-18 MED ORDER — SODIUM CHLORIDE 0.9 % IV SOLN
Freq: Once | INTRAVENOUS | Status: AC
  Administered 2014-01-18: 11:00:00 via INTRAVENOUS

## 2014-01-18 MED ORDER — LORAZEPAM 2 MG/ML IJ SOLN
0.5000 mg | Freq: Once | INTRAMUSCULAR | Status: AC
  Administered 2014-01-18: 0.5 mg via INTRAVENOUS

## 2014-01-18 MED ORDER — SODIUM CHLORIDE 0.9 % IV SOLN
800.0000 mg/m2 | Freq: Once | INTRAVENOUS | Status: AC
  Administered 2014-01-18: 1520 mg via INTRAVENOUS
  Filled 2014-01-18: qty 39.98

## 2014-01-18 MED ORDER — DEXAMETHASONE SODIUM PHOSPHATE 20 MG/5ML IJ SOLN
INTRAMUSCULAR | Status: AC
  Filled 2014-01-18: qty 5

## 2014-01-18 MED ORDER — SODIUM CHLORIDE 0.9 % IJ SOLN
10.0000 mL | INTRAMUSCULAR | Status: DC | PRN
  Administered 2014-01-18: 10 mL
  Filled 2014-01-18: qty 10

## 2014-01-18 NOTE — Telephone Encounter (Signed)
per pof to sch appts & IV fluids-sent email to MW to sch will call pt once reply from [facility]

## 2014-01-18 NOTE — Progress Notes (Signed)
ID: Tina [name] OB: [birth year]  MR#: [id number]  LMR#:[id number]  PCP: [name], DO GYN:   SU:  OTHER MD:  CHIEF COMPLAINT:Tina [name] 53 y.o. [place], [place] woman with clinical stage T2Nx IIA invasive ductal carcinoma, grade 3, ER positive, HER-2/neu positive invasive ductal carcinoma of the right breast   BREAST CANCER HISTORY:  1. Underwent screening mammogram. On the right side she was noted to have a mass with microcalcifications. Ultrasound in February 2015 showed a 1.2 x 1.1 x 1.0 cm rounded hypoechoic mass with irregular margins in the 8:00 position 4 cm from the nipple. No abnormal axillary lymph nodes were noted. She had MRI of the breasts performed that showed in the lower outer right breast 2.2 x 1.3 x 1.5 cm Irregular mass. In the upper outer left breast there was a 7 x 5 x 8 mm indeterminate enhancing mass. Also another 9 x 7 x 6 mm indeterminant enhancing mass at 9:00 in the left breast was noted.there are also noted to be ominent cortices of right axillary lymph nodes. Patient has undergone biopsy of these. On the right patient had biopsy performed of the 1.2 cm mass. The pathology revealed grade 3 invasive ductal carcinoma with ductal carcinoma in situ. Tumor was ER +70% PR +6% HER-2/neu was amplified and proliferation marker Ki-67 70%   2. Referred to genetic counseling: testing was negative.    CURRENT THERAPY: Neoadjuvant Gemcitabine, Carboplatin, Trastuzumab and Pertuzumab cycle 2 day 1  INTERVAL HISTORY:  Tina [name] is here today for evaluation prior to receiving her second cycle of neoadjuvant Gemcitabine, Carboplatin, Trastuzumab and Pertuzumab.  She receives this treatment on day 1 of a 21 day cycle with Neulasta given on day 2 for granulocyte support.  She is day 1 of cycle 2.    She is doing moderately well today.  Her neuropathy is slightly improved.  She is taking Gabapentin as prescribed.  She has some mild swelling in her ankles.  She denies fevers,  chills, chest pain, DOE, orthopnea, palpitat, nausea, vomiting, constipation, diarrhea, or any further concerns.   REVIEW OF SYSTEMS:  A 10 point review of systems was conducted and is otherwise negative except for what is noted above.     PAST MEDICAL HISTORY: Past Medical History  Diagnosis Date  . Leukocytopenia   . Hypertension   . Degenerative joint disease   . Leukopenia 06/16/2013  . Needs flu shot 06/17/2013  . Cancer of right breast 09/03/13    Invasive Ductal Carcinoma/Ductal Carcinoma Insitu    PAST SURGICAL HISTORY: Past Surgical History  Procedure Laterality Date  . Cesarean section    . Carpal tunnel release    . Trigger finger release      FAMILY HISTORY Family History  Problem Relation Age of Onset  . Hypertension Mother   . Dementia Mother     small vessel disease  . Cancer Mother 30    cervical  cancer  . Cancer Father 77    prostate ca  . Diabetes Father   . Arthritis Father     GYNECOLOGIC HISTORY: menarche at age 41, G69 P3, no h/o abnormal pap smears, LMP 09/2013 (on Zoladex), has h/o chlamydia at age 75. Used progesterone cream x 1 month.      SOCIAL HISTORY: Lives with husband of 3 years, Tina [name], in a 2 story house.  Drinks ETOH occasionally, has 10 pack year tobacco history, no illicit drug use.      ADVANCED DIRECTIVES: not in  place   HEALTH MAINTENANCE: History  Substance Use Topics  . Smoking status: Former Smoker -- 1.00 packs/day for 15 years    Types: Cigars, Cigarettes  . Smokeless tobacco: Never Used     Comment: remote smoking history  . Alcohol Use: 0.0 oz/week    0 Shots of liquor per week     Comment: occ     Mammogram: 08/2013 Colonoscopy: not yet Bone Density Scan: n/a Pap Smear:  06/25/2012 Eye Exam: 03/2013 Vitamin D Level:  n/a Lipid Panel: 04/2013   Allergies  Allergen Reactions  . Adhesive [Tape]   . Nickel     Current Outpatient Prescriptions  Medication Sig Dispense Refill  . Alum & Mag  Hydroxide-Simeth (MAGIC MOUTHWASH) SOLN Take 5 mLs by mouth 4 (four) times daily.  100 mL  2  . ascorbic acid (VITAMIN C) 1000 MG tablet Take 1,000 mg by mouth daily.      Marland Kitchen aspirin 81 MG tablet Take 81 mg by mouth daily.      Marland Kitchen BIOTIN 5000 PO Take by mouth daily.       . Calcium Carbonate-Vit D-Min (CALTRATE 600+D PLUS MINERALS PO) Take 1 tablet by mouth daily.      . Cholecalciferol (VITAMIN D3) 10000 UNITS capsule Take 10,000 Units by mouth daily.      . Coenzyme Q10 (CO Q 10 PO) Take by mouth daily.      Marland Kitchen gabapentin (NEURONTIN) 100 MG capsule Take 3 capsules (300 mg total) by mouth 3 (three) times daily.  270 capsule  2  . L-ARGININE PO Take by mouth daily.      Marland Kitchen lidocaine-prilocaine (EMLA) cream Apply topically as needed.  30 g  6  . lisinopril-hydrochlorothiazide (PRINZIDE,ZESTORETIC) 20-12.5 MG per tablet Take 2 tablets by mouth daily.  120 tablet  1  . LORazepam (ATIVAN) 0.5 MG tablet Take 1 tablet (0.5 mg total) by mouth every 6 (six) hours as needed (Nausea or vomiting).  30 tablet  0  . nystatin (MYCOSTATIN) 100000 UNIT/ML suspension Take 5 mLs (500,000 Units total) by mouth 4 (four) times daily.  60 mL  0  . omeprazole (PRILOSEC) 40 MG capsule Take 1 capsule (40 mg total) by mouth daily.  30 capsule  6  . Probiotic Product (TRUBIOTICS) CAPS Take 1 capsule by mouth daily.      Marland Kitchen VITAMIN E PO Take 1 capsule by mouth daily.       Marland Kitchen dexamethasone (DECADRON) 4 MG tablet Take 2 tablets (8 mg total) by mouth 2 (two) times daily. start the day after chemo for 3 days.  30 tablet  1  . ondansetron (ZOFRAN) 8 MG tablet Take 1 tablet (8 mg total) by mouth 2 (two) times daily. Start the day after chemo for 3 days. Then take as needed for nausea or vomiting.  30 tablet  1  . OVER THE COUNTER MEDICATION Take 1 tablet by mouth daily. Trace Minerals      . oxyCODONE (OXY IR/ROXICODONE) 5 MG immediate release tablet Take 1 tablet (5 mg total) by mouth every 4 (four) hours as needed for severe pain.   60 tablet  0  . potassium chloride SA (K-DUR,KLOR-CON) 20 MEQ tablet Take 1 tablet (20 mEq total) by mouth once. X 3 days, then stop  5 tablet  0  . prochlorperazine (COMPAZINE) 10 MG tablet Take 1 tablet (10 mg total) by mouth every 6 (six) hours as needed (Nausea or vomiting).  30 tablet  1  No current facility-administered medications for this visit.   Facility-Administered Medications Ordered in Other Visits  Medication Dose Route Frequency Provider Last Rate Last Dose  . 0.9 %  sodium chloride infusion   Intravenous Continuous Minette Headland, NP 1,000 mL/hr at 12/26/13 0915    . ondansetron (ZOFRAN) IVPB 8 mg  8 mg Intravenous Daily PRN Minette Headland, NP   8 mg at 01/19/14 1050  . prochlorperazine (COMPAZINE) injection 10 mg  10 mg Intravenous Q6H PRN Minette Headland, NP   10 mg at 12/26/13 0917  . sodium chloride 0.9 % injection 10 mL  10 mL Intracatheter PRN Minette Headland, NP   10 mL at 01/19/14 1256    OBJECTIVE: Filed Vitals:   01/18/14 1031  BP: 169/99  Pulse: 111  Temp: 98.9 F (37.2 C)  Resp: 20     Body mass index is 33.03 kg/(m^2).     GENERAL: Patient is a well appearing female in no acute distress HEENT:  Sclerae anicteric.  Oropharynx clear and moist. No ulcerations or evidence of oropharyngeal candidiasis. Neck is supple.  NODES:  No cervical, supraclavicular, or axillary lymphadenopathy palpated.  BREAST EXAM:  Unable to palpate a right breast mass LUNGS:  Clear to auscultation bilaterally.  No wheezes or rhonchi. HEART:  Regular rate and rhythm. No murmur appreciated. ABDOMEN:  Soft, nontender.  Positive, normoactive bowel sounds. No organomegaly palpated. MSK:  No focal spinal tenderness to palpation. Full range of motion bilaterally in the upper extremities. EXTREMITIES:  No peripheral edema.   SKIN:  Clear with no obvious rashes or skin changes. No nail dyscrasia. NEURO:  Nonfocal. Well oriented.  Appropriate affect. ECOG FS:1 - Symptomatic  but completely ambulatory  LAB RESULTS:  CMP     Component Value Date/Time   NA 144 01/18/2014 1019   NA 138 09/25/2013 1320   K 3.7 01/18/2014 1019   K 3.6* 09/25/2013 1320   CL 98 09/25/2013 1320   CL 104 06/16/2012 1303   CO2 30* 01/18/2014 1019   CO2 27 09/25/2013 1320   GLUCOSE 135 01/18/2014 1019   GLUCOSE 82 09/25/2013 1320   GLUCOSE 85 06/16/2012 1303   BUN 13.5 01/18/2014 1019   BUN 9 09/25/2013 1320   CREATININE 1.0 01/18/2014 1019   CREATININE 0.82 09/25/2013 1320   CALCIUM 9.7 01/18/2014 1019   CALCIUM 10.0 09/25/2013 1320   PROT 7.0 01/18/2014 1019   PROT 8.7* 10/04/2012 1830   ALBUMIN 3.6 01/18/2014 1019   ALBUMIN 4.1 10/04/2012 1830   AST 26 01/18/2014 1019   AST 32 10/04/2012 1830   ALT 23 01/18/2014 1019   ALT 27 10/04/2012 1830   ALKPHOS 48 01/18/2014 1019   ALKPHOS 42 10/04/2012 1830   BILITOT 0.55 01/18/2014 1019   BILITOT 0.7 10/04/2012 1830   GFRNONAA 82* 09/25/2013 1320   GFRAA >90 09/25/2013 1320    I No results found for this basename: SPEP,  UPEP,   kappa and lambda light chains    Lab Results  Component Value Date   WBC 2.7* 01/18/2014   NEUTROABS 1.5 01/18/2014   HGB 10.3* 01/18/2014   HCT 31.0* 01/18/2014   MCV 101.6* 01/18/2014   PLT 325 01/18/2014      Chemistry      Component Value Date/Time   NA 144 01/18/2014 1019   NA 138 09/25/2013 1320   K 3.7 01/18/2014 1019   K 3.6* 09/25/2013 1320   CL 98 09/25/2013 1320   CL  104 06/16/2012 1303   CO2 30* 01/18/2014 1019   CO2 27 09/25/2013 1320   BUN 13.5 01/18/2014 1019   BUN 9 09/25/2013 1320   CREATININE 1.0 01/18/2014 1019   CREATININE 0.82 09/25/2013 1320      Component Value Date/Time   CALCIUM 9.7 01/18/2014 1019   CALCIUM 10.0 09/25/2013 1320   ALKPHOS 48 01/18/2014 1019   ALKPHOS 42 10/04/2012 1830   AST 26 01/18/2014 1019   AST 32 10/04/2012 1830   ALT 23 01/18/2014 1019   ALT 27 10/04/2012 1830   BILITOT 0.55 01/18/2014 1019   BILITOT 0.7 10/04/2012 1830       No results found for this basename: LABCA2    No  components found with this basename: LABCA125    No results found for this basename: INR,  in the last 168 hours  Urinalysis    Component Value Date/Time   LABSPEC 1.005 10/09/2013 1646   GLUCOSEU Negative 10/09/2013 1646   UROBILINOGEN 0.2 10/09/2013 1646    STUDIES: No results found.  ASSESSMENT: 53 y.o. [place], [place] woman with clinical stage T2Nx IIA invasive ductal carcinoma, grade 3, ER positive, HER-2/neu positive invasive ductal carcinoma of the right breast.   1.  Patient was started on neoadjuvant Docetaxel, Carboplatin, Trastuzumab, Pertuzumab that started on 10/02/13. She receives treatment on day 1 of a 21 day cycle with Neulasta given on day 2 for granulocyte support. The patient received 4 cycles and due to fluid retention and neuropathy the Docetaxel was discontinued.  Gemcitabine was given at 864m/m2 instead, with no other changes made to her chemotherapy regimen.    PLAN:   Tina [name] doing well today.  Her labs are stable. I reviewed them with her in detail.  She will proceed with her final treatment today.  She will continue Gabapentin for her neuropathy.    Cardiac: Her last echocardiogram on 11/30/2013 demonstrated a LVEF of 65-70%. She was evaluated by Dr. BVaughan Browneron 11/30/13 in the cardio-onc clinic. He reviewed her echo, and cleared her to continue with Herceptin. 3 month f/u with echocardiogram was recommended.   She will return on 7/1 for labs and evaluation. MRI of her breasts is on 01/26/14, and f/u with Dr. WDonne Hazelis on 02/02/14.  She knows to call [place] the interim for any questions or concerns.  We can certainly see her sooner if needed.  I spent 25 minutes counseling the patient face to face.  The total time spent in the appointment was 30 minutes.  LMinette Headland NPine [id number] [phone]/2015 1:50 PM

## 2014-01-18 NOTE — Telephone Encounter (Signed)
Per staff message and POF I have scheduled appts. Advised scheduler of appts. JMW  

## 2014-01-18 NOTE — Patient Instructions (Signed)
[facility] Discharge Instructions for Patients Receiving Chemotherapy  Today you received the following chemotherapy agents Herceptin/Perjeta/Gemzar  To help prevent nausea and vomiting after your treatment, we encourage you to take your nausea medication as prescribed.   If you develop nausea and vomiting that is not controlled by your nausea medication, call the clinic.   BELOW ARE SYMPTOMS THAT SHOULD BE REPORTED IMMEDIATELY:  *FEVER GREATER THAN 100.5 F  *CHILLS WITH OR WITHOUT FEVER  NAUSEA AND VOMITING THAT IS NOT CONTROLLED WITH YOUR NAUSEA MEDICATION  *UNUSUAL SHORTNESS OF BREATH  *UNUSUAL BRUISING OR BLEEDING  TENDERNESS IN MOUTH AND THROAT WITH OR WITHOUT PRESENCE OF ULCERS  *URINARY PROBLEMS  *BOWEL PROBLEMS  UNUSUAL RASH Items with * indicate a potential emergency and should be followed up as soon as possible.  Feel free to call the clinic you have any questions or concerns. The clinic phone number is [phone].

## 2014-01-19 ENCOUNTER — Ambulatory Visit: Payer: BC Managed Care – PPO

## 2014-01-19 ENCOUNTER — Ambulatory Visit (HOSPITAL_BASED_OUTPATIENT_CLINIC_OR_DEPARTMENT_OTHER): Payer: BC Managed Care – PPO

## 2014-01-19 ENCOUNTER — Encounter: Payer: Self-pay | Admitting: Adult Health

## 2014-01-19 ENCOUNTER — Other Ambulatory Visit: Payer: Self-pay

## 2014-01-19 VITALS — BP 153/93 | HR 120 | Temp 98.9°F | Resp 20

## 2014-01-19 DIAGNOSIS — Z5189 Encounter for other specified aftercare: Secondary | ICD-10-CM

## 2014-01-19 DIAGNOSIS — C50519 Malignant neoplasm of lower-outer quadrant of unspecified female breast: Secondary | ICD-10-CM

## 2014-01-19 MED ORDER — ONDANSETRON 8 MG/NS 50 ML IVPB
INTRAVENOUS | Status: AC
  Filled 2014-01-19: qty 8

## 2014-01-19 MED ORDER — SODIUM CHLORIDE 0.9 % IV SOLN
Freq: Once | INTRAVENOUS | Status: AC
  Administered 2014-01-19: 11:00:00 via INTRAVENOUS

## 2014-01-19 MED ORDER — SODIUM CHLORIDE 0.9 % IJ SOLN
10.0000 mL | INTRAMUSCULAR | Status: DC | PRN
  Administered 2014-01-19: 10 mL
  Filled 2014-01-19: qty 10

## 2014-01-19 MED ORDER — HEPARIN SOD (PORK) LOCK FLUSH 100 UNIT/ML IV SOLN
500.0000 [IU] | Freq: Once | INTRAVENOUS | Status: AC | PRN
  Administered 2014-01-19: 500 [IU]
  Filled 2014-01-19: qty 5

## 2014-01-19 MED ORDER — ONDANSETRON 8 MG/50ML IVPB (CHCC)
8.0000 mg | Freq: Every day | INTRAVENOUS | Status: DC | PRN
  Administered 2014-01-19: 8 mg via INTRAVENOUS

## 2014-01-19 MED ORDER — PEGFILGRASTIM INJECTION 6 MG/0.6ML
6.0000 mg | Freq: Once | SUBCUTANEOUS | Status: AC
  Administered 2014-01-19: 6 mg via SUBCUTANEOUS
  Filled 2014-01-19: qty 0.6

## 2014-01-19 NOTE — Progress Notes (Signed)
Received Neulasta injection while getting fluids in infusion room

## 2014-01-19 NOTE — Patient Instructions (Addendum)
Dehydration, Adult Dehydration means your body does not have as much fluid as it needs. Your kidneys, brain, and heart will not work properly without the right amount of fluids and salt.  HOME CARE  Ask your doctor how to replace body fluid losses (rehydrate).  Drink enough fluids to keep your pee (urine) clear or pale yellow.  Drink small amounts of fluids often if you feel sick to your stomach (nauseous) or throw up (vomit).  Eat like you normally do.  Avoid:  Foods or drinks high in sugar.  Bubbly (carbonated) drinks.  Juice.  Very hot or cold fluids.  Drinks with caffeine.  Fatty, greasy foods.  Alcohol.  Tobacco.  Eating too much.  Gelatin desserts.  Wash your hands to avoid spreading germs (bacteria, viruses).  Only take medicine as told by your doctor.  Keep all doctor visits as told. GET HELP RIGHT AWAY IF:   You cannot drink something without throwing up.  You get worse even with treatment.  Your vomit has blood in it or looks greenish.  Your poop (stool) has blood in it or looks black and tarry.  You have not peed in 6 to 8 hours.  You pee a small amount of very dark pee.  You have a fever.  You pass out (faint).  You have belly (abdominal) pain that gets worse or stays in one spot (localizes).  You have a rash, stiff neck, or bad headache.  You get easily annoyed, sleepy, or are hard to wake up.  You feel weak, dizzy, or very thirsty. MAKE SURE YOU:   Understand these instructions.  Will watch your condition.  Will get help right away if you are not doing well or get worse. Document Released: 05/12/2009 Document Revised: 10/08/2011 Document Reviewed: 03/05/2011 [facility] Patient Information 2015 [place], [place]. This information is not intended to replace advice given to you by your health care provider. Make sure you discuss any questions you have with your health care provider.   Pegfilgrastim injection What is this  medicine? PEGFILGRASTIM (peg fil GRA stim) helps the body make more white blood cells. It is used to prevent infection in people with low amounts of white blood cells following cancer treatment. This medicine may be used for other purposes; ask your health care provider or pharmacist if you have questions. COMMON BRAND NAME(S): Neulasta What should I tell my health care provider before I take this medicine? They need to know if you have any of these conditions: -sickle cell disease -an unusual or allergic reaction to pegfilgrastim, filgrastim, E.coli protein, other medicines, foods, dyes, or preservatives -pregnant or trying to get pregnant -breast-feeding How should I use this medicine? This medicine is for injection under the skin. It is usually given by a health care professional in a hospital or clinic setting. If you get this medicine at home, you will be taught how to prepare and give this medicine. Do not shake this medicine. Use exactly as directed. Take your medicine at regular intervals. Do not take your medicine more often than directed. It is important that you put your used needles and syringes in a special sharps container. Do not put them in a trash can. If you do not have a sharps container, call your pharmacist or healthcare provider to get one. Talk to your pediatrician regarding the use of this medicine in children. While this drug may be prescribed for children who weigh more than 45 kg for selected conditions, precautions do apply Overdosage: If  you think you have taken too much of this medicine contact a poison control center or emergency room at once. NOTE: This medicine is only for you. Do not share this medicine with others. What if I miss a dose? If you miss a dose, take it as soon as you can. If it is almost time for your next dose, take only that dose. Do not take double or extra doses. What may interact with this medicine? -lithium -medicines for growth therapy This  list may not describe all possible interactions. Give your health care provider a list of all the medicines, herbs, non-prescription drugs, or dietary supplements you use. Also tell them if you smoke, drink alcohol, or use illegal drugs. Some items may interact with your medicine. What should I watch for while using this medicine? Visit your doctor for regular check ups. You will need important blood work done while you are taking this medicine. What side effects may I notice from receiving this medicine? Side effects that you should report to your doctor or health care professional as soon as possible: -allergic reactions like skin rash, itching or hives, swelling of the face, lips, or tongue -breathing problems -fever -pain, redness, or swelling where injected -shoulder pain -stomach or side pain Side effects that usually do not require medical attention (report to your doctor or health care professional if they continue or are bothersome): -aches, pains -headache -loss of appetite -nausea, vomiting -unusually tired This list may not describe all possible side effects. Call your doctor for medical advice about side effects. You may report side effects to FDA at 1-800-FDA-1088. Where should I keep my medicine? Keep out of the reach of children. Store in a refrigerator between 2 and 8 degrees C (36 and 46 degrees F). Do not freeze. Keep in carton to protect from light. Throw away this medicine if it is left out of the refrigerator for more than 48 hours. Throw away any unused medicine after the expiration date. NOTE: This sheet is a summary. It may not cover all possible information. If you have questions about this medicine, talk to your doctor, pharmacist, or health care provider.  2015, Elsevier/Gold Standard. (2008-02-16 15:41:44)

## 2014-01-20 ENCOUNTER — Telehealth: Payer: Self-pay | Admitting: Adult Health

## 2014-01-20 NOTE — Telephone Encounter (Signed)
cld & spoke w/pt to adv of sch appts/trmts-pt stated looked on MY CHART and has updated sch

## 2014-01-21 ENCOUNTER — Other Ambulatory Visit: Payer: Self-pay | Admitting: *Deleted

## 2014-01-21 ENCOUNTER — Ambulatory Visit (HOSPITAL_BASED_OUTPATIENT_CLINIC_OR_DEPARTMENT_OTHER): Payer: BC Managed Care – PPO

## 2014-01-21 DIAGNOSIS — C50519 Malignant neoplasm of lower-outer quadrant of unspecified female breast: Secondary | ICD-10-CM

## 2014-01-21 DIAGNOSIS — C50512 Malignant neoplasm of lower-outer quadrant of left female breast: Secondary | ICD-10-CM

## 2014-01-21 DIAGNOSIS — E860 Dehydration: Secondary | ICD-10-CM

## 2014-01-21 MED ORDER — HEPARIN SOD (PORK) LOCK FLUSH 100 UNIT/ML IV SOLN
250.0000 [IU] | Freq: Once | INTRAVENOUS | Status: AC | PRN
Start: 2014-01-21 — End: 2014-01-21
  Administered 2014-01-21: 100 [IU]
  Filled 2014-01-21: qty 5

## 2014-01-21 MED ORDER — ONDANSETRON 8 MG/NS 50 ML IVPB
INTRAVENOUS | Status: AC
  Filled 2014-01-21: qty 8

## 2014-01-21 MED ORDER — ONDANSETRON 8 MG/50ML IVPB (CHCC)
8.0000 mg | Freq: Every day | INTRAVENOUS | Status: DC | PRN
  Administered 2014-01-21: 8 mg via INTRAVENOUS

## 2014-01-21 MED ORDER — SODIUM CHLORIDE 0.9 % IJ SOLN
10.0000 mL | INTRAMUSCULAR | Status: DC | PRN
  Administered 2014-01-21: 10 mL
  Filled 2014-01-21: qty 10

## 2014-01-21 MED ORDER — SODIUM CHLORIDE 0.9 % IV SOLN
Freq: Once | INTRAVENOUS | Status: AC
  Administered 2014-01-21: 12:00:00 via INTRAVENOUS

## 2014-01-21 NOTE — Patient Instructions (Signed)
Dehydration, Adult Dehydration is when you lose more fluids from the body than you take in. Vital organs like the kidneys, brain, and heart cannot function without a proper amount of fluids and salt. Any loss of fluids from the body can cause dehydration.  CAUSES   Vomiting.  Diarrhea.  Excessive sweating.  Excessive urine output.  Fever. SYMPTOMS  Mild dehydration  Thirst.  Dry lips.  Slightly dry mouth. Moderate dehydration  Very dry mouth.  Sunken eyes.  Skin does not bounce back quickly when lightly pinched and released.  Dark urine and decreased urine production.  Decreased tear production.  Headache. Severe dehydration  Very dry mouth.  Extreme thirst.  Rapid, weak pulse (more than 100 beats per minute at rest).  Cold hands and feet.  Not able to sweat in spite of heat and temperature.  Rapid breathing.  Blue lips.  Confusion and lethargy.  Difficulty being awakened.  Minimal urine production.  No tears. DIAGNOSIS  Your caregiver will diagnose dehydration based on your symptoms and your exam. Blood and urine tests will help confirm the diagnosis. The diagnostic evaluation should also identify the cause of dehydration. TREATMENT  Treatment of mild or moderate dehydration can often be done at home by increasing the amount of fluids that you drink. It is best to drink small amounts of fluid more often. Drinking too much at one time can make vomiting worse. Refer to the home care instructions below. Severe dehydration needs to be treated at the hospital where you will probably be given intravenous (IV) fluids that contain water and electrolytes. HOME CARE INSTRUCTIONS   Ask your caregiver about specific rehydration instructions.  Drink enough fluids to keep your urine clear or pale yellow.  Drink small amounts frequently if you have nausea and vomiting.  Eat as you normally do.  Avoid:  Foods or drinks high in sugar.  Carbonated  drinks.  Juice.  Extremely hot or cold fluids.  Drinks with caffeine.  Fatty, greasy foods.  Alcohol.  Tobacco.  Overeating.  Gelatin desserts.  Wash your hands well to avoid spreading bacteria and viruses.  Only take over-the-counter or prescription medicines for pain, discomfort, or fever as directed by your caregiver.  Ask your caregiver if you should continue all prescribed and over-the-counter medicines.  Keep all follow-up appointments with your caregiver. SEEK MEDICAL CARE IF:  You have abdominal pain and it increases or stays in one area (localizes).  You have a rash, stiff neck, or severe headache.  You are irritable, sleepy, or difficult to awaken.  You are weak, dizzy, or extremely thirsty. SEEK IMMEDIATE MEDICAL CARE IF:   You are unable to keep fluids down or you get worse despite treatment.  You have frequent episodes of vomiting or diarrhea.  You have blood or green matter (bile) in your vomit.  You have blood in your stool or your stool looks black and tarry.  You have not urinated in 6 to 8 hours, or you have only urinated a small amount of very dark urine.  You have a fever.  You faint. MAKE SURE YOU:   Understand these instructions.  Will watch your condition.  Will get help right away if you are not doing well or get worse. Document Released: 07/16/2005 Document Revised: 10/08/2011 Document Reviewed: 03/05/2011 [employer] Patient Information 2015 [employer]. This information is not intended to replace advice given to you by your health care provider. Make sure you discuss any questions you have with your health care   provider.  

## 2014-01-22 ENCOUNTER — Other Ambulatory Visit: Payer: Self-pay | Admitting: *Deleted

## 2014-01-22 ENCOUNTER — Ambulatory Visit (HOSPITAL_BASED_OUTPATIENT_CLINIC_OR_DEPARTMENT_OTHER): Payer: BC Managed Care – PPO

## 2014-01-22 VITALS — BP 130/83 | HR 87 | Temp 98.4°F

## 2014-01-22 DIAGNOSIS — C50519 Malignant neoplasm of lower-outer quadrant of unspecified female breast: Secondary | ICD-10-CM

## 2014-01-22 DIAGNOSIS — E860 Dehydration: Secondary | ICD-10-CM

## 2014-01-22 DIAGNOSIS — C50512 Malignant neoplasm of lower-outer quadrant of left female breast: Secondary | ICD-10-CM

## 2014-01-22 MED ORDER — HEPARIN SOD (PORK) LOCK FLUSH 100 UNIT/ML IV SOLN
500.0000 [IU] | Freq: Once | INTRAVENOUS | Status: AC | PRN
  Administered 2014-01-22: 500 [IU]
  Filled 2014-01-22: qty 5

## 2014-01-22 MED ORDER — SODIUM CHLORIDE 0.9 % IV SOLN
Freq: Once | INTRAVENOUS | Status: AC
  Administered 2014-01-22: 14:00:00 via INTRAVENOUS

## 2014-01-22 MED ORDER — SODIUM CHLORIDE 0.9 % IJ SOLN
10.0000 mL | INTRAMUSCULAR | Status: DC | PRN
  Administered 2014-01-22: 10 mL
  Filled 2014-01-22: qty 10

## 2014-01-22 MED ORDER — ONDANSETRON 8 MG/50ML IVPB (CHCC)
8.0000 mg | Freq: Every day | INTRAVENOUS | Status: DC | PRN
  Administered 2014-01-22: 8 mg via INTRAVENOUS

## 2014-01-22 MED ORDER — ONDANSETRON 8 MG/NS 50 ML IVPB
INTRAVENOUS | Status: AC
  Filled 2014-01-22: qty 8

## 2014-01-22 NOTE — Patient Instructions (Signed)
Dehydration, Adult Dehydration is when you lose more fluids from the body than you take in. Vital organs like the kidneys, brain, and heart cannot function without a proper amount of fluids and salt. Any loss of fluids from the body can cause dehydration.  CAUSES   Vomiting.  Diarrhea.  Excessive sweating.  Excessive urine output.  Fever. SYMPTOMS  Mild dehydration  Thirst.  Dry lips.  Slightly dry mouth. Moderate dehydration  Very dry mouth.  Sunken eyes.  Skin does not bounce back quickly when lightly pinched and released.  Dark urine and decreased urine production.  Decreased tear production.  Headache. Severe dehydration  Very dry mouth.  Extreme thirst.  Rapid, weak pulse (more than 100 beats per minute at rest).  Cold hands and feet.  Not able to sweat in spite of heat and temperature.  Rapid breathing.  Blue lips.  Confusion and lethargy.  Difficulty being awakened.  Minimal urine production.  No tears. DIAGNOSIS  Your caregiver will diagnose dehydration based on your symptoms and your exam. Blood and urine tests will help confirm the diagnosis. The diagnostic evaluation should also identify the cause of dehydration. TREATMENT  Treatment of mild or moderate dehydration can often be done at home by increasing the amount of fluids that you drink. It is best to drink small amounts of fluid more often. Drinking too much at one time can make vomiting worse. Refer to the home care instructions below. Severe dehydration needs to be treated at the hospital where you will probably be given intravenous (IV) fluids that contain water and electrolytes. HOME CARE INSTRUCTIONS   Ask your caregiver about specific rehydration instructions.  Drink enough fluids to keep your urine clear or pale yellow.  Drink small amounts frequently if you have nausea and vomiting.  Eat as you normally do.  Avoid:  Foods or drinks high in sugar.  Carbonated  drinks.  Juice.  Extremely hot or cold fluids.  Drinks with caffeine.  Fatty, greasy foods.  Alcohol.  Tobacco.  Overeating.  Gelatin desserts.  Wash your hands well to avoid spreading bacteria and viruses.  Only take over-the-counter or prescription medicines for pain, discomfort, or fever as directed by your caregiver.  Ask your caregiver if you should continue all prescribed and over-the-counter medicines.  Keep all follow-up appointments with your caregiver. SEEK MEDICAL CARE IF:  You have abdominal pain and it increases or stays in one area (localizes).  You have a rash, stiff neck, or severe headache.  You are irritable, sleepy, or difficult to awaken.  You are weak, dizzy, or extremely thirsty. SEEK IMMEDIATE MEDICAL CARE IF:   You are unable to keep fluids down or you get worse despite treatment.  You have frequent episodes of vomiting or diarrhea.  You have blood or green matter (bile) in your vomit.  You have blood in your stool or your stool looks black and tarry.  You have not urinated in 6 to 8 hours, or you have only urinated a small amount of very dark urine.  You have a fever.  You faint. MAKE SURE YOU:   Understand these instructions.  Will watch your condition.  Will get help right away if you are not doing well or get worse. Document Released: 07/16/2005 Document Revised: 10/08/2011 Document Reviewed: 03/05/2011 [employer] Patient Information 2015 [employer]. This information is not intended to replace advice given to you by your health care provider. Make sure you discuss any questions you have with your health care   provider.  

## 2014-01-25 ENCOUNTER — Encounter: Payer: Self-pay | Admitting: Adult Health

## 2014-01-25 NOTE — Progress Notes (Signed)
Per patient one-- gemzar asst denied, her insurance will pay.

## 2014-01-26 ENCOUNTER — Ambulatory Visit
Admission: RE | Admit: 2014-01-26 | Discharge: 2014-01-26 | Disposition: A | Discharge status: Home / Self Care | Payer: BC Managed Care – PPO | Source: Ambulatory Visit | Attending: Adult Health | Admitting: Adult Health

## 2014-01-26 DIAGNOSIS — C50519 Malignant neoplasm of lower-outer quadrant of unspecified female breast: Secondary | ICD-10-CM

## 2014-01-26 MED ORDER — GADOBENATE DIMEGLUMINE 529 MG/ML IV SOLN
14.0000 mL | Freq: Once | INTRAVENOUS | Status: AC | PRN
  Administered 2014-01-26: 14 mL via INTRAVENOUS

## 2014-01-27 ENCOUNTER — Telehealth: Payer: Self-pay | Admitting: *Deleted

## 2014-01-27 ENCOUNTER — Telehealth: Payer: Self-pay | Admitting: Adult Health

## 2014-01-27 ENCOUNTER — Encounter: Payer: Self-pay | Admitting: Adult Health

## 2014-01-27 ENCOUNTER — Other Ambulatory Visit: Payer: BC Managed Care – PPO

## 2014-01-27 ENCOUNTER — Ambulatory Visit (HOSPITAL_BASED_OUTPATIENT_CLINIC_OR_DEPARTMENT_OTHER): Payer: BC Managed Care – PPO | Admitting: Adult Health

## 2014-01-27 ENCOUNTER — Other Ambulatory Visit (HOSPITAL_BASED_OUTPATIENT_CLINIC_OR_DEPARTMENT_OTHER): Payer: BC Managed Care – PPO

## 2014-01-27 VITALS — BP 147/91 | HR 102 | Temp 98.4°F | Resp 18 | Ht 64.0 in | Wt 187.3 lb

## 2014-01-27 DIAGNOSIS — C50519 Malignant neoplasm of lower-outer quadrant of unspecified female breast: Secondary | ICD-10-CM

## 2014-01-27 DIAGNOSIS — C50512 Malignant neoplasm of lower-outer quadrant of left female breast: Secondary | ICD-10-CM

## 2014-01-27 DIAGNOSIS — Z170 Estrogen receptor positive status [ER+]: Secondary | ICD-10-CM

## 2014-01-27 LAB — COMPREHENSIVE METABOLIC PANEL (CC13)
ALT: 32 U/L (ref 0–55)
ANION GAP: 11 meq/L (ref 3–11)
AST: 18 U/L (ref 5–34)
Albumin: 3.8 g/dL (ref 3.5–5.0)
Alkaline Phosphatase: 121 U/L (ref 40–150)
BILIRUBIN TOTAL: 0.35 mg/dL (ref 0.20–1.20)
BUN: 11.7 mg/dL (ref 7.0–26.0)
CO2: 26 meq/L (ref 22–29)
Calcium: 9.3 mg/dL (ref 8.4–10.4)
Chloride: 104 mEq/L (ref 98–109)
Creatinine: 1 mg/dL (ref 0.6–1.1)
Glucose: 193 mg/dl — ABNORMAL HIGH (ref 70–140)
Potassium: 3.8 mEq/L (ref 3.5–5.1)
Sodium: 141 mEq/L (ref 136–145)
Total Protein: 7.3 g/dL (ref 6.4–8.3)

## 2014-01-27 LAB — CBC WITH DIFFERENTIAL/PLATELET
BASO%: 0.4 % (ref 0.0–2.0)
Basophils Absolute: 0 10*3/uL (ref 0.0–0.1)
EOS%: 0.4 % (ref 0.0–7.0)
Eosinophils Absolute: 0 10*3/uL (ref 0.0–0.5)
HEMATOCRIT: 33 % — AB (ref 34.8–46.6)
HGB: 11.1 g/dL — ABNORMAL LOW (ref 11.6–15.9)
LYMPH%: 21.9 % (ref 14.0–49.7)
MCH: 34.3 pg — AB (ref 25.1–34.0)
MCHC: 33.6 g/dL (ref 31.5–36.0)
MCV: 102.3 fL — AB (ref 79.5–101.0)
MONO#: 0.3 10*3/uL (ref 0.1–0.9)
MONO%: 3.7 % (ref 0.0–14.0)
NEUT#: 5.8 10*3/uL (ref 1.5–6.5)
NEUT%: 73.6 % (ref 38.4–76.8)
Platelets: 125 10*3/uL — ABNORMAL LOW (ref 145–400)
RBC: 3.23 10*6/uL — ABNORMAL LOW (ref 3.70–5.45)
RDW: 14 % (ref 11.2–14.5)
WBC: 7.8 10*3/uL (ref 3.9–10.3)
lymph#: 1.7 10*3/uL (ref 0.9–3.3)

## 2014-01-27 NOTE — Progress Notes (Signed)
ID: [name] OB: 01/19/1961  MR#: [id number]  PFX#:[id number]  PCP: [name], DO GYN:   SU:  OTHER MD:  CHIEF COMPLAINT:Glen Alroy Dust 53 y.o. [place], [place] woman with clinical stage T2Nx IIA invasive ductal carcinoma, grade 3, ER positive, HER-2/neu positive invasive ductal carcinoma of the right breast   BREAST CANCER HISTORY:  1. Underwent screening mammogram. On the right side she was noted to have a mass with microcalcifications. Ultrasound in February 2015 showed a 1.2 x 1.1 x 1.0 cm rounded hypoechoic mass with irregular margins in the 8:00 position 4 cm from the nipple. No abnormal axillary lymph nodes were noted. She had MRI of the breasts performed that showed in the lower outer right breast 2.2 x 1.3 x 1.5 cm Irregular mass. In the upper outer left breast there was a 7 x 5 x 8 mm indeterminate enhancing mass. Also another 9 x 7 x 6 mm indeterminant enhancing mass at 9:00 in the left breast was noted.there are also noted to be ominent cortices of right axillary lymph nodes. Patient has undergone biopsy of these. On the right patient had biopsy performed of the 1.2 cm mass. The pathology revealed grade 3 invasive ductal carcinoma with ductal carcinoma in situ. Tumor was ER +70% PR +6% HER-2/neu was amplified and proliferation marker Ki-67 70%   2. Referred to genetic counseling: testing was negative.    CURRENT THERAPY: Neoadjuvant Gemcitabine, Carboplatin, Trastuzumab and Pertuzumab cycle 2 day 8  INTERVAL HISTORY:  Mrs. Centner is here today for evaluation prior to receiving her second cycle of neoadjuvant Gemcitabine, Carboplatin, Trastuzumab and Pertuzumab.  She receives this treatment on day 1 of a 21 day cycle with Neulasta given on day 2 for granulocyte support.  She is day 8 of cycle 2.    Adriana is doing well today.  Her neuropathy is improved with the Gabapentin increase.  She is happy to have completed her chemotherapy.  Otherwise, she denies fevers, chills,  nausea, vomiting, constipation, diarrhea, or any further concerns.      REVIEW OF SYSTEMS:  A 10 point review of systems was conducted and is otherwise negative except for what is noted above.     PAST MEDICAL HISTORY: Past Medical History  Diagnosis Date  . Leukocytopenia   . Hypertension   . Degenerative joint disease   . Leukopenia 06/16/2013  . Needs flu shot 06/17/2013  . Cancer of right breast 09/03/13    Invasive Ductal Carcinoma/Ductal Carcinoma Insitu    PAST SURGICAL HISTORY: Past Surgical History  Procedure Laterality Date  . Cesarean section    . Carpal tunnel release    . Trigger finger release      FAMILY HISTORY Family History  Problem Relation Age of Onset  . Hypertension Mother   . Dementia Mother     small vessel disease  . Cancer Mother 30    cervical  cancer  . Cancer Father 65    prostate ca  . Diabetes Father   . Arthritis Father     GYNECOLOGIC HISTORY: menarche at age 51, G89 P3, no h/o abnormal pap smears, LMP 09/2013 (on Zoladex), has h/o chlamydia at age 66. Used progesterone cream x 1 month.      SOCIAL HISTORY: Lives with husband of 3 years, Eulamae Greenstein, in a 2 story house.  Drinks ETOH occasionally, has 10 pack year tobacco history, no illicit drug use.      ADVANCED DIRECTIVES: not in place   HEALTH MAINTENANCE: History  Substance Use Topics  . Smoking status: Former Smoker -- 1.00 packs/day for 15 years    Types: Cigars, Cigarettes  . Smokeless tobacco: Never Used     Comment: remote smoking history  . Alcohol Use: 0.0 oz/week    0 Shots of liquor per week     Comment: occ     Mammogram: 08/2013 Colonoscopy: not yet Bone Density Scan: n/a Pap Smear:  06/25/2012 Eye Exam: 03/2013 Vitamin D Level:  n/a Lipid Panel: 04/2013   Allergies  Allergen Reactions  . Adhesive [Tape]   . Nickel     Current Outpatient Prescriptions  Medication Sig Dispense Refill  . ascorbic acid (VITAMIN C) 1000 MG tablet Take 1,000 mg by  mouth daily.      Marland Kitchen aspirin 81 MG tablet Take 81 mg by mouth daily.      Marland Kitchen b complex vitamins capsule Take 1 capsule by mouth daily.      Marland Kitchen BIOTIN 5000 PO Take by mouth daily.       . Calcium Carbonate-Vit D-Min (CALTRATE 600+D PLUS MINERALS PO) Take 1 tablet by mouth daily.      . Cholecalciferol (VITAMIN D3) 10000 UNITS capsule Take 10,000 Units by mouth daily.      . Coenzyme Q10 (CO Q 10 PO) Take by mouth daily.      Marland Kitchen gabapentin (NEURONTIN) 100 MG capsule Take 3 capsules (300 mg total) by mouth 3 (three) times daily.  270 capsule  2  . L-ARGININE PO Take by mouth daily.      Marland Kitchen lisinopril-hydrochlorothiazide (PRINZIDE,ZESTORETIC) 20-12.5 MG per tablet Take 2 tablets by mouth daily.  120 tablet  1  . LORazepam (ATIVAN) 0.5 MG tablet Take 1 tablet (0.5 mg total) by mouth every 6 (six) hours as needed (Nausea or vomiting).  30 tablet  0  . omeprazole (PRILOSEC) 40 MG capsule Take 1 capsule (40 mg total) by mouth daily.  30 capsule  6  . OVER THE COUNTER MEDICATION Take 1 tablet by mouth daily. Trace Minerals      . Probiotic Product (TRUBIOTICS) CAPS Take 1 capsule by mouth daily.      Marland Kitchen VITAMIN E PO Take 1 capsule by mouth daily.       Marland Kitchen lidocaine-prilocaine (EMLA) cream Apply topically as needed.  30 g  6  . oxyCODONE (OXY IR/ROXICODONE) 5 MG immediate release tablet Take 1 tablet (5 mg total) by mouth every 4 (four) hours as needed for severe pain.  60 tablet  0   No current facility-administered medications for this visit.   Facility-Administered Medications Ordered in Other Visits  Medication Dose Route Frequency Provider Last Rate Last Dose  . 0.9 %  sodium chloride infusion   Intravenous Continuous Minette Headland, NP 1,000 mL/hr at 12/26/13 0915    . prochlorperazine (COMPAZINE) injection 10 mg  10 mg Intravenous Q6H PRN Minette Headland, NP   10 mg at 12/26/13 0917    OBJECTIVE: Filed Vitals:   01/27/14 0918  BP: 147/91  Pulse: 102  Temp: 98.4 F (36.9 C)  Resp: 18      Body mass index is 32.13 kg/(m^2).     GENERAL: Patient is a well appearing female in no acute distress HEENT:  Sclerae anicteric.  Oropharynx clear and moist. No ulcerations or evidence of oropharyngeal candidiasis. Neck is supple.  NODES:  No cervical, supraclavicular, or axillary lymphadenopathy palpated.  BREAST EXAM:  Unable to palpate a right breast mass LUNGS:  Clear to  auscultation bilaterally.  No wheezes or rhonchi. HEART:  Regular rate and rhythm. No murmur appreciated. ABDOMEN:  Soft, nontender.  Positive, normoactive bowel sounds. No organomegaly palpated. MSK:  No focal spinal tenderness to palpation. Full range of motion bilaterally in the upper extremities. EXTREMITIES:  No peripheral edema.   SKIN:  Clear with no obvious rashes or skin changes. No nail dyscrasia. NEURO:  Nonfocal. Well oriented.  Appropriate affect. ECOG FS:1 - Symptomatic but completely ambulatory  LAB RESULTS:  CMP     Component Value Date/Time   NA 144 01/18/2014 1019   NA 138 09/25/2013 1320   K 3.7 01/18/2014 1019   K 3.6* 09/25/2013 1320   CL 98 09/25/2013 1320   CL 104 06/16/2012 1303   CO2 30* 01/18/2014 1019   CO2 27 09/25/2013 1320   GLUCOSE 135 01/18/2014 1019   GLUCOSE 82 09/25/2013 1320   GLUCOSE 85 06/16/2012 1303   BUN 13.5 01/18/2014 1019   BUN 9 09/25/2013 1320   CREATININE 1.0 01/18/2014 1019   CREATININE 0.82 09/25/2013 1320   CALCIUM 9.7 01/18/2014 1019   CALCIUM 10.0 09/25/2013 1320   PROT 7.0 01/18/2014 1019   PROT 8.7* 10/04/2012 1830   ALBUMIN 3.6 01/18/2014 1019   ALBUMIN 4.1 10/04/2012 1830   AST 26 01/18/2014 1019   AST 32 10/04/2012 1830   ALT 23 01/18/2014 1019   ALT 27 10/04/2012 1830   ALKPHOS 48 01/18/2014 1019   ALKPHOS 42 10/04/2012 1830   BILITOT 0.55 01/18/2014 1019   BILITOT 0.7 10/04/2012 1830   GFRNONAA 82* 09/25/2013 1320   GFRAA >90 09/25/2013 1320    I No results found for this basename: SPEP,  UPEP,   kappa and lambda light chains    Lab Results  Component Value Date    WBC 7.8 01/27/2014   NEUTROABS 5.8 01/27/2014   HGB 11.1* 01/27/2014   HCT 33.0* 01/27/2014   MCV 102.3* 01/27/2014   PLT 125* 01/27/2014      Chemistry      Component Value Date/Time   NA 144 01/18/2014 1019   NA 138 09/25/2013 1320   K 3.7 01/18/2014 1019   K 3.6* 09/25/2013 1320   CL 98 09/25/2013 1320   CL 104 06/16/2012 1303   CO2 30* 01/18/2014 1019   CO2 27 09/25/2013 1320   BUN 13.5 01/18/2014 1019   BUN 9 09/25/2013 1320   CREATININE 1.0 01/18/2014 1019   CREATININE 0.82 09/25/2013 1320      Component Value Date/Time   CALCIUM 9.7 01/18/2014 1019   CALCIUM 10.0 09/25/2013 1320   ALKPHOS 48 01/18/2014 1019   ALKPHOS 42 10/04/2012 1830   AST 26 01/18/2014 1019   AST 32 10/04/2012 1830   ALT 23 01/18/2014 1019   ALT 27 10/04/2012 1830   BILITOT 0.55 01/18/2014 1019   BILITOT 0.7 10/04/2012 1830       No results found for this basename: LABCA2    No components found with this basename: LABCA125    No results found for this basename: INR,  in the last 168 hours  Urinalysis    Component Value Date/Time   LABSPEC 1.005 10/09/2013 1646   GLUCOSEU Negative 10/09/2013 1646   UROBILINOGEN 0.2 10/09/2013 1646    STUDIES: No results found.  ASSESSMENT: 53 y.o. [place], [place] woman with clinical stage T2Nx IIA invasive ductal carcinoma, grade 3, ER positive, HER-2/neu positive invasive ductal carcinoma of the right breast.   1.  Patient was started on  neoadjuvant Docetaxel, Carboplatin, Trastuzumab, Pertuzumab that started on 10/02/13. She receives treatment on day 1 of a 21 day cycle with Neulasta given on day 2 for granulocyte support. The patient received 4 cycles and due to fluid retention and neuropathy the Docetaxel was discontinued.  Gemcitabine was given at 84m/m2 instead, with no other changes made to her chemotherapy regimen.    PLAN:   Mrs. MWhipkeyis doing well today.  She has completed neoadjuvant chemotherapy.  MRI of her breasts yesterday demonstrates a complete radiographic  response to treatment.  I reviewed these results with her in detail.  She is very elated.  She will proceed with f/u with Dr. WDonne Hazelin surgery.    Cardiac: Her last echocardiogram on 11/30/2013 demonstrated a LVEF of 65-70%. She was evaluated by Dr. BVaughan Browneron 11/30/13 in the cardio-onc clinic. He reviewed her echo, and cleared her to continue with Herceptin. 3 month f/u with echocardiogram was recommended.   She will return for f/u with Dr. WDonne Hazelon 02/02/14, she will see me again on 02/12/14.  She knows to call [facility] the interim for any questions or concerns.  We can certainly see her sooner if needed.  I spent 25 minutes counseling the patient face to face.  The total time spent in the appointment was 30 minutes.  LMinette Headland [phone]/07/2013 9:33 AM

## 2014-01-27 NOTE — Telephone Encounter (Signed)
per pof to sch appt-sch trmt w/MW and inj-gave pt copy of sch

## 2014-01-27 NOTE — Telephone Encounter (Signed)
Per POF scheduled appt. 

## 2014-02-02 ENCOUNTER — Ambulatory Visit (INDEPENDENT_AMBULATORY_CARE_PROVIDER_SITE_OTHER): Payer: BC Managed Care – PPO | Admitting: General Surgery

## 2014-02-02 ENCOUNTER — Encounter (INDEPENDENT_AMBULATORY_CARE_PROVIDER_SITE_OTHER): Payer: Self-pay | Admitting: General Surgery

## 2014-02-02 VITALS — BP 134/78 | HR 84 | Temp 97.5°F | Resp 16 | Ht 64.0 in | Wt 189.0 lb

## 2014-02-02 DIAGNOSIS — C50511 Malignant neoplasm of lower-outer quadrant of right female breast: Secondary | ICD-10-CM

## 2014-02-02 DIAGNOSIS — C50519 Malignant neoplasm of lower-outer quadrant of unspecified female breast: Secondary | ICD-10-CM

## 2014-02-02 NOTE — Progress Notes (Signed)
Patient ID: Tina [name], female   DOB: March 18, 1961, 53 y.o.   MRN: [id number]  Chief Complaint  Patient presents with  . eval right breast ca    HPI Tina [name] is a 53 y.o. female.   HPI This is a 53 year old female who initially presented in February of 2015. At that point in time she appeared to have a clinical stage I right breast cancer. On MRI this did appear to be a little bit larger. There was another abnormality on the left side that was biopsied and was negative. There was a indeterminate right axillary node that was biopsied was also negative. The tumor was biopsied and was a grade 3 invasive ductal carcinoma with ductal carcinoma in situ there is ER-positive is 70%, PR positive at 6%, HER-2/neu was amplified and the proliferation marker was 70%. Her genetic testing is negative. She has undergone neoadjuvant chemotherapy with Herceptin and perjeta. She's done very well from this. She's only had some peripheral neuropathy which has been taking care of with gabapentin. Her repeat MRI at the end of chemotherapy shows resolution of her right breast mass. There are no real worrisome areas of the left breast and no abnormal lymph nodes. She comes in today to discuss her eventual surgery. She completed chemotherapy 22nd of June. Past Medical History  Diagnosis Date  . Leukocytopenia   . Hypertension   . Degenerative joint disease   . Leukopenia 06/16/2013  . Needs flu shot 06/17/2013  . Cancer of right breast 09/03/13    Invasive Ductal Carcinoma/Ductal Carcinoma Insitu    Past Surgical History  Procedure Laterality Date  . Cesarean section    . Carpal tunnel release    . Trigger finger release    . Portacath placement      Family History  Problem Relation Age of Onset  . Hypertension Mother   . Dementia Mother     small vessel disease  . Cancer Mother 30    cervical  cancer  . Cancer Father 30    prostate ca  . Diabetes Father   . Arthritis Father     Social  History History  Substance Use Topics  . Smoking status: Former Smoker -- 1.00 packs/day for 15 years    Types: Cigars, Cigarettes  . Smokeless tobacco: Never Used     Comment: remote smoking history  . Alcohol Use: 0.0 oz/week    0 Shots of liquor per week     Comment: occ     Allergies  Allergen Reactions  . Adhesive [Tape]   . Nickel     Current Outpatient Prescriptions  Medication Sig Dispense Refill  . ascorbic acid (VITAMIN C) 1000 MG tablet Take 1,000 mg by mouth daily.      Marland Kitchen aspirin 81 MG tablet Take 81 mg by mouth daily.      Marland Kitchen b complex vitamins capsule Take 1 capsule by mouth daily.      Marland Kitchen BIOTIN 5000 PO Take by mouth daily.       . Calcium Carbonate-Vit D-Min (CALTRATE 600+D PLUS MINERALS PO) Take 1 tablet by mouth daily.      . Cholecalciferol (VITAMIN D3) 10000 UNITS capsule Take 10,000 Units by mouth daily.      . Coenzyme Q10 (CO Q 10 PO) Take by mouth daily.      Marland Kitchen gabapentin (NEURONTIN) 100 MG capsule Take 3 capsules (300 mg total) by mouth 3 (three) times daily.  270 capsule  2  . L-ARGININE PO  Take by mouth daily.      . lidocaine-prilocaine (EMLA) cream Apply topically as needed.  30 g  6  . lisinopril-hydrochlorothiazide (PRINZIDE,ZESTORETIC) 20-12.5 MG per tablet Take 2 tablets by mouth daily.  120 tablet  1  . LORazepam (ATIVAN) 0.5 MG tablet Take 1 tablet (0.5 mg total) by mouth every 6 (six) hours as needed (Nausea or vomiting).  30 tablet  0  . omeprazole (PRILOSEC) 40 MG capsule Take 1 capsule (40 mg total) by mouth daily.  30 capsule  6  . OVER THE COUNTER MEDICATION Take 1 tablet by mouth daily. Trace Minerals      . oxyCODONE (OXY IR/ROXICODONE) 5 MG immediate release tablet Take 1 tablet (5 mg total) by mouth every 4 (four) hours as needed for severe pain.  60 tablet  0  . Probiotic Product (TRUBIOTICS) CAPS Take 1 capsule by mouth daily.      . VITAMIN E PO Take 1 capsule by mouth daily.        No current facility-administered medications for  this visit.   Facility-Administered Medications Ordered in Other Visits  Medication Dose Route Frequency Provider Last Rate Last Dose  . 0.9 %  sodium chloride infusion   Intravenous Continuous Lindsey N Cornetto, NP 1,000 mL/hr at 12/26/13 0915    . prochlorperazine (COMPAZINE) injection 10 mg  10 mg Intravenous Q6H PRN Lindsey N Cornetto, NP   10 mg at 12/26/13 0917    Review of Systems Review of Systems  Constitutional: Negative for fever, chills and unexpected weight change.  HENT: Negative for congestion, hearing loss, sore throat, trouble swallowing and voice change.   Eyes: Negative for visual disturbance.  Respiratory: Negative for cough and wheezing.   Cardiovascular: Negative for chest pain, palpitations and leg swelling.  Gastrointestinal: Negative for nausea, vomiting, abdominal pain, diarrhea, constipation, blood in stool, abdominal distention and anal bleeding.  Genitourinary: Negative for hematuria, vaginal bleeding and difficulty urinating.  Musculoskeletal: Negative for arthralgias.  Skin: Negative for rash and wound.  Neurological: Negative for seizures, syncope and headaches.  Hematological: Negative for adenopathy. Does not bruise/bleed easily.  Psychiatric/Behavioral: Negative for confusion.    Blood pressure 134/78, pulse 84, temperature 97.5 F (36.4 C), resp. rate 16, height 5' 4" (1.626 m), weight 189 lb (85.73 kg).  Physical Exam Physical Exam  Vitals reviewed. Constitutional: She appears well-developed and well-nourished.  Eyes: No scleral icterus.  Neck: Neck supple.  Cardiovascular: Normal rate, regular rhythm and normal heart sounds.   Pulmonary/Chest: Effort normal and breath sounds normal. She has no wheezes. She has no rales. Right breast exhibits no inverted nipple, no mass, no nipple discharge, no skin change and no tenderness. Left breast exhibits no inverted nipple, no mass, no nipple discharge, no skin change and no tenderness.   Lymphadenopathy:    She has no cervical adenopathy.    She has no axillary adenopathy.       Right: No supraclavicular adenopathy present.       Left: No supraclavicular adenopathy present.    Data Reviewed EXAM  BILATERAL BREAST MRI WITH AND WITHOUT CONTRAST  TECHNIQUE:  Multiplanar, multisequence MR images of both breasts were obtained  prior to and following the intravenous administration of 14ml of  MultiHance  THREE-DIMENSIONAL MR IMAGE RENDERING ON INDEPENDENT WORKSTATION:  Three-dimensional MR images were rendered by post-processing of the  original MR data on an independent workstation. The  three-dimensional MR images were interpreted, and findings are  reported in the following complete   MRI report for this study. Three  dimensional images were evaluated at the independent DynaCad  workstation  COMPARISON: 09/11/2013 breast MRI.  FINDINGS:  Breast composition: c. Heterogeneous fibroglandular tissue.  Background parenchymal enhancement: Mild  Right breast: The previously seen 2.2 cm irregular enhancing mass  located within the lower outer quadrant of the right breast is no  longer visualized. Known measurable mass remains. There are no new  foci of enhancement within the right breast.  Left breast: The 8 mm oval enhancing nodule within the upper outer  quadrant left breast which has been showed to represent a benign  intramammary lymph node is stable. There are no worrisome areas of  enhancement within the left breast.  Lymph nodes: No abnormal appearing lymph nodes.  Ancillary findings: None.  IMPRESSION:  Considerable improvement in the previously seen enhancing mass  within the lower outer quadrant of the right breast. No visible  enhancing mass remains.   Assessment    Clinical stage I right breast cancer s/p primary chemotherapy     Plan    Right breast radioactive seed guided lumpectomy, right axillary sentinel node biopsy     We discussed her very  good radiologic result and reason we still needed to do breast surgery and node biopsy.  We reviewed the options below again.    We discussed a sentinel lymph node biopsy as she does not appear to having lymph node involvement right now. We discussed the performance of that with injection of radioactive tracer and blue dye. We discussed that she would have an incision underneath her axillary hairline. We discussed that there is a chance of having a positive node with a sentinel lymph node biopsy and we will await the permanent pathology to make any other first further decisions in terms of her treatment. One of these options might be to return to the operating room to perform an axillary lymph node dissection. We discussed up to a 5% risk lifetime of chronic shoulder pain as well as lymphedema associated with a sentinel lymph node biopsy.  We discussed the options for treatment of the breast cancer which included lumpectomy versus a mastectomy. We discussed the performance of the lumpectomy with seed placement. We discussed a 5% chance of a positive margin requiring reexcision in the operating room. We also discussed that she will need radiation therapy if she undergoes lumpectomy. We discussed the mastectomy and the postoperative care for that as well. We discussed reconstruction options also. We discussed that there is no difference in her survival whether she undergoes lumpectomy with radiation therapy or antiestrogen therapy versus a mastectomy.  We discussed the risks of operation including bleeding, infection, possible reoperation. We will proceed somewhere 3-4 weeks after chemotherapy     Tina [name] 02/02/2014, 9:49 AM

## 2014-02-09 ENCOUNTER — Other Ambulatory Visit (INDEPENDENT_AMBULATORY_CARE_PROVIDER_SITE_OTHER): Payer: Self-pay | Admitting: General Surgery

## 2014-02-09 DIAGNOSIS — C50511 Malignant neoplasm of lower-outer quadrant of right female breast: Secondary | ICD-10-CM

## 2014-02-12 ENCOUNTER — Other Ambulatory Visit: Payer: Self-pay | Admitting: Oncology

## 2014-02-12 ENCOUNTER — Ambulatory Visit (HOSPITAL_BASED_OUTPATIENT_CLINIC_OR_DEPARTMENT_OTHER): Payer: BC Managed Care – PPO

## 2014-02-12 ENCOUNTER — Ambulatory Visit: Payer: BC Managed Care – PPO

## 2014-02-12 ENCOUNTER — Other Ambulatory Visit (HOSPITAL_BASED_OUTPATIENT_CLINIC_OR_DEPARTMENT_OTHER): Payer: BC Managed Care – PPO

## 2014-02-12 ENCOUNTER — Telehealth: Payer: Self-pay | Admitting: Adult Health

## 2014-02-12 ENCOUNTER — Ambulatory Visit (HOSPITAL_BASED_OUTPATIENT_CLINIC_OR_DEPARTMENT_OTHER): Payer: BC Managed Care – PPO | Admitting: Adult Health

## 2014-02-12 ENCOUNTER — Ambulatory Visit: Payer: BC Managed Care – PPO | Admitting: Adult Health

## 2014-02-12 ENCOUNTER — Encounter: Payer: Self-pay | Admitting: Adult Health

## 2014-02-12 VITALS — BP 147/80 | HR 94 | Temp 98.3°F | Resp 20 | Ht 64.0 in | Wt 189.6 lb

## 2014-02-12 DIAGNOSIS — C50519 Malignant neoplasm of lower-outer quadrant of unspecified female breast: Secondary | ICD-10-CM

## 2014-02-12 DIAGNOSIS — Z170 Estrogen receptor positive status [ER+]: Secondary | ICD-10-CM

## 2014-02-12 DIAGNOSIS — Z5112 Encounter for antineoplastic immunotherapy: Secondary | ICD-10-CM

## 2014-02-12 DIAGNOSIS — Z5111 Encounter for antineoplastic chemotherapy: Secondary | ICD-10-CM

## 2014-02-12 DIAGNOSIS — C50511 Malignant neoplasm of lower-outer quadrant of right female breast: Secondary | ICD-10-CM

## 2014-02-12 LAB — CBC WITH DIFFERENTIAL/PLATELET
BASO%: 0.7 % (ref 0.0–2.0)
BASOS ABS: 0 10*3/uL (ref 0.0–0.1)
EOS%: 1.7 % (ref 0.0–7.0)
Eosinophils Absolute: 0 10*3/uL (ref 0.0–0.5)
HCT: 34.2 % — ABNORMAL LOW (ref 34.8–46.6)
HEMOGLOBIN: 11.4 g/dL — AB (ref 11.6–15.9)
LYMPH#: 1 10*3/uL (ref 0.9–3.3)
LYMPH%: 34.9 % (ref 14.0–49.7)
MCH: 34 pg (ref 25.1–34.0)
MCHC: 33.5 g/dL (ref 31.5–36.0)
MCV: 101.7 fL — ABNORMAL HIGH (ref 79.5–101.0)
MONO#: 0.3 10*3/uL (ref 0.1–0.9)
MONO%: 10 % (ref 0.0–14.0)
NEUT#: 1.6 10*3/uL (ref 1.5–6.5)
NEUT%: 52.7 % (ref 38.4–76.8)
Platelets: 341 10*3/uL (ref 145–400)
RBC: 3.36 10*6/uL — ABNORMAL LOW (ref 3.70–5.45)
RDW: 13.8 % (ref 11.2–14.5)
WBC: 3 10*3/uL — ABNORMAL LOW (ref 3.9–10.3)

## 2014-02-12 LAB — COMPREHENSIVE METABOLIC PANEL (CC13)
ALT: 22 U/L (ref 0–55)
ANION GAP: 10 meq/L (ref 3–11)
AST: 22 U/L (ref 5–34)
Albumin: 3.9 g/dL (ref 3.5–5.0)
Alkaline Phosphatase: 65 U/L (ref 40–150)
BUN: 16.1 mg/dL (ref 7.0–26.0)
CALCIUM: 9.5 mg/dL (ref 8.4–10.4)
CHLORIDE: 104 meq/L (ref 98–109)
CO2: 28 meq/L (ref 22–29)
CREATININE: 1 mg/dL (ref 0.6–1.1)
Glucose: 106 mg/dl (ref 70–140)
Potassium: 3.8 mEq/L (ref 3.5–5.1)
Sodium: 141 mEq/L (ref 136–145)
Total Bilirubin: 0.72 mg/dL (ref 0.20–1.20)
Total Protein: 7.6 g/dL (ref 6.4–8.3)

## 2014-02-12 MED ORDER — DIPHENHYDRAMINE HCL 25 MG PO CAPS
ORAL_CAPSULE | ORAL | Status: AC
  Filled 2014-02-12: qty 2

## 2014-02-12 MED ORDER — GOSERELIN ACETATE 3.6 MG ~~LOC~~ IMPL
3.6000 mg | DRUG_IMPLANT | Freq: Once | SUBCUTANEOUS | Status: AC
  Administered 2014-02-12: 3.6 mg via SUBCUTANEOUS
  Filled 2014-02-12: qty 3.6

## 2014-02-12 MED ORDER — HEPARIN SOD (PORK) LOCK FLUSH 100 UNIT/ML IV SOLN
500.0000 [IU] | Freq: Once | INTRAVENOUS | Status: AC | PRN
  Administered 2014-02-12: 500 [IU]
  Filled 2014-02-12: qty 5

## 2014-02-12 MED ORDER — ACETAMINOPHEN 325 MG PO TABS
650.0000 mg | ORAL_TABLET | Freq: Once | ORAL | Status: AC
  Administered 2014-02-12: 650 mg via ORAL

## 2014-02-12 MED ORDER — ACETAMINOPHEN 325 MG PO TABS
ORAL_TABLET | ORAL | Status: AC
  Filled 2014-02-12: qty 2

## 2014-02-12 MED ORDER — DIPHENHYDRAMINE HCL 25 MG PO CAPS
50.0000 mg | ORAL_CAPSULE | Freq: Once | ORAL | Status: AC
  Administered 2014-02-12: 50 mg via ORAL

## 2014-02-12 MED ORDER — SODIUM CHLORIDE 0.9 % IV SOLN
Freq: Once | INTRAVENOUS | Status: AC
  Administered 2014-02-12: 12:00:00 via INTRAVENOUS

## 2014-02-12 MED ORDER — SODIUM CHLORIDE 0.9 % IJ SOLN
10.0000 mL | INTRAMUSCULAR | Status: DC | PRN
  Administered 2014-02-12: 10 mL
  Filled 2014-02-12: qty 10

## 2014-02-12 MED ORDER — TRASTUZUMAB CHEMO INJECTION 440 MG
6.0000 mg/kg | Freq: Once | INTRAVENOUS | Status: AC
  Administered 2014-02-12: 504 mg via INTRAVENOUS
  Filled 2014-02-12: qty 24

## 2014-02-12 NOTE — Patient Instructions (Signed)

## 2014-02-12 NOTE — Progress Notes (Signed)
ID: [name] OB: 20-Nov-1960  MR#: [id number]  CSN#:[id number]  PCP: [name], DO GYN:   SU:  OTHER MD:  CHIEF COMPLAINT:Daniell Alroy Dust 53 y.o. [place], [place] woman with clinical stage T2Nx IIA invasive ductal carcinoma, grade 3, ER positive, HER-2/neu positive invasive ductal carcinoma of the right breast   BREAST CANCER HISTORY:  1. Underwent screening mammogram. On the right side she was noted to have a mass with microcalcifications. Ultrasound in February 2015 showed a 1.2 x 1.1 x 1.0 cm rounded hypoechoic mass with irregular margins in the 8:00 position 4 cm from the nipple. No abnormal axillary lymph nodes were noted. She had MRI of the breasts performed that showed in the lower outer right breast 2.2 x 1.3 x 1.5 cm Irregular mass. In the upper outer left breast there was a 7 x 5 x 8 mm indeterminate enhancing mass. Also another 9 x 7 x 6 mm indeterminant enhancing mass at 9:00 in the left breast was noted.there are also noted to be ominent cortices of right axillary lymph nodes. Patient has undergone biopsy of these. On the right patient had biopsy performed of the 1.2 cm mass. The pathology revealed grade 3 invasive ductal carcinoma with ductal carcinoma in situ. Tumor was ER +70% PR +6% HER-2/neu was amplified and proliferation marker Ki-67 70%   2. Referred to genetic counseling: testing was negative.    CURRENT THERAPY:   INTERVAL HISTORY:  Mrs. Encarnacion is here today for evaluation prior to receiving Herceptin.  She is doing well today.  She met with Dr. [name] on 02/12/14 and has surgery scheduled with lumpectomy and sentinel node biopsy on 03/01/14.    She is doing well today.  She is looking forward to her surgery on August 3, she has had a swollen lymph node in her left neck, that appeared about 3-4 days ago.  She did have a right fingernail infection but that has resolved.  She denies fevers, chills, nausea, vomiting, constipation, diarrhea, numbness tingling,  shortness of breath or any further concerns.     REVIEW OF SYSTEMS:  A 10 point review of systems was conducted and is otherwise negative except for what is noted above.     PAST MEDICAL HISTORY: Past Medical History  Diagnosis Date  . Leukocytopenia   . Hypertension   . Degenerative joint disease   . Leukopenia 06/16/2013  . Needs flu shot 06/17/2013  . Cancer of right breast 09/03/13    Invasive Ductal Carcinoma/Ductal Carcinoma Insitu    PAST SURGICAL HISTORY: Past Surgical History  Procedure Laterality Date  . Cesarean section    . Carpal tunnel release    . Trigger finger release    . Portacath placement      FAMILY HISTORY Family History  Problem Relation Age of Onset  . Hypertension Mother   . Dementia Mother     small vessel disease  . Cancer Mother 30    cervical  cancer  . Cancer Father 63    prostate ca  . Diabetes Father   . Arthritis Father     GYNECOLOGIC HISTORY: menarche at age 60, G56 P3, no h/o abnormal pap smears, LMP 09/2013 (on Zoladex), has h/o chlamydia at age 48. Used progesterone cream x 1 month.      SOCIAL HISTORY: Lives with husband of 3 years, Amos Gaber, in a 2 story house.  Drinks ETOH occasionally, has 10 pack year tobacco history, no illicit drug use.      ADVANCED  DIRECTIVES: not in place   HEALTH MAINTENANCE: History  Substance Use Topics  . Smoking status: Former Smoker -- 1.00 packs/day for 15 years    Types: Cigars, Cigarettes  . Smokeless tobacco: Never Used     Comment: remote smoking history  . Alcohol Use: 0.0 oz/week    0 Shots of liquor per week     Comment: occ     Mammogram: 08/2013 Colonoscopy: not yet Bone Density Scan: n/a Pap Smear:  06/25/2012 Eye Exam: 03/2013 Vitamin D Level:  n/a Lipid Panel: 04/2013   Allergies  Allergen Reactions  . Adhesive [Tape]   . Nickel     Current Outpatient Prescriptions  Medication Sig Dispense Refill  . ascorbic acid (VITAMIN C) 1000 MG tablet Take 1,000 mg  by mouth daily.      Marland Kitchen aspirin 81 MG tablet Take 81 mg by mouth daily.      Marland Kitchen b complex vitamins capsule Take 1 capsule by mouth daily.      Marland Kitchen BIOTIN 5000 PO Take by mouth daily.       . Calcium Carbonate-Vit D-Min (CALTRATE 600+D PLUS MINERALS PO) Take 1 tablet by mouth daily.      . Cholecalciferol (VITAMIN D3) 10000 UNITS capsule Take 10,000 Units by mouth daily.      . Coenzyme Q10 (CO Q 10 PO) Take by mouth daily.      Marland Kitchen gabapentin (NEURONTIN) 100 MG capsule Take 3 capsules (300 mg total) by mouth 3 (three) times daily.  270 capsule  2  . L-ARGININE PO Take by mouth daily.      Marland Kitchen lidocaine-prilocaine (EMLA) cream Apply topically as needed.  30 g  6  . lisinopril-hydrochlorothiazide (PRINZIDE,ZESTORETIC) 20-12.5 MG per tablet Take 2 tablets by mouth daily.  120 tablet  1  . omeprazole (PRILOSEC) 40 MG capsule Take 1 capsule (40 mg total) by mouth daily.  30 capsule  6  . OVER THE COUNTER MEDICATION Take 1 tablet by mouth daily. Trace Minerals      . Probiotic Product (TRUBIOTICS) CAPS Take 1 capsule by mouth daily.      Marland Kitchen VITAMIN E PO Take 1 capsule by mouth daily.       Marland Kitchen LORazepam (ATIVAN) 0.5 MG tablet Take 1 tablet (0.5 mg total) by mouth every 6 (six) hours as needed (Nausea or vomiting).  30 tablet  0  . oxyCODONE (OXY IR/ROXICODONE) 5 MG immediate release tablet Take 1 tablet (5 mg total) by mouth every 4 (four) hours as needed for severe pain.  60 tablet  0   No current facility-administered medications for this visit.   Facility-Administered Medications Ordered in Other Visits  Medication Dose Route Frequency Provider Last Rate Last Dose  . 0.9 %  sodium chloride infusion   Intravenous Continuous Minette Headland, NP 1,000 mL/hr at 12/26/13 0915    . prochlorperazine (COMPAZINE) injection 10 mg  10 mg Intravenous Q6H PRN Minette Headland, NP   10 mg at 12/26/13 7001    OBJECTIVE: Filed Vitals:   02/12/14 1017  BP: 147/80  Pulse: 94  Temp: 98.3 F (36.8 C)  Resp: 20      Body mass index is 32.53 kg/(m^2).     GENERAL: Patient is a well appearing female in no acute distress HEENT:  Sclerae anicteric.  Oropharynx clear and moist. No ulcerations or evidence of oropharyngeal candidiasis. Neck is supple.  NODES:  No cervical, supraclavicular, or axillary lymphadenopathy palpated. Small 1 cm lymph node  palpable in left submandibular area when patient bends neck to the left BREAST EXAM:  Unable to palpate a right breast mass LUNGS:  Clear to auscultation bilaterally.  No wheezes or rhonchi. HEART:  Regular rate and rhythm. No murmur appreciated. ABDOMEN:  Soft, nontender.  Positive, normoactive bowel sounds. No organomegaly palpated. MSK:  No focal spinal tenderness to palpation. Full range of motion bilaterally in the upper extremities. EXTREMITIES:  No peripheral edema.   SKIN:  Clear with no obvious rashes or skin changes. No nail dyscrasia. NEURO:  Nonfocal. Well oriented.  Appropriate affect. ECOG FS:1 - Symptomatic but completely ambulatory  LAB RESULTS:  CMP     Component Value Date/Time   NA 141 02/12/2014 0950   NA 138 09/25/2013 1320   K 3.8 02/12/2014 0950   K 3.6* 09/25/2013 1320   CL 98 09/25/2013 1320   CL 104 06/16/2012 1303   CO2 28 02/12/2014 0950   CO2 27 09/25/2013 1320   GLUCOSE 106 02/12/2014 0950   GLUCOSE 82 09/25/2013 1320   GLUCOSE 85 06/16/2012 1303   BUN 16.1 02/12/2014 0950   BUN 9 09/25/2013 1320   CREATININE 1.0 02/12/2014 0950   CREATININE 0.82 09/25/2013 1320   CALCIUM 9.5 02/12/2014 0950   CALCIUM 10.0 09/25/2013 1320   PROT 7.6 02/12/2014 0950   PROT 8.7* 10/04/2012 1830   ALBUMIN 3.9 02/12/2014 0950   ALBUMIN 4.1 10/04/2012 1830   AST 22 02/12/2014 0950   AST 32 10/04/2012 1830   ALT 22 02/12/2014 0950   ALT 27 10/04/2012 1830   ALKPHOS 65 02/12/2014 0950   ALKPHOS 42 10/04/2012 1830   BILITOT 0.72 02/12/2014 0950   BILITOT 0.7 10/04/2012 1830   GFRNONAA 82* 09/25/2013 1320   GFRAA >90 09/25/2013 1320    I No results found for this  basename: SPEP,  UPEP,   kappa and lambda light chains    Lab Results  Component Value Date   WBC 3.0* 02/12/2014   NEUTROABS 1.6 02/12/2014   HGB 11.4* 02/12/2014   HCT 34.2* 02/12/2014   MCV 101.7* 02/12/2014   PLT 341 02/12/2014      Chemistry      Component Value Date/Time   NA 141 02/12/2014 0950   NA 138 09/25/2013 1320   K 3.8 02/12/2014 0950   K 3.6* 09/25/2013 1320   CL 98 09/25/2013 1320   CL 104 06/16/2012 1303   CO2 28 02/12/2014 0950   CO2 27 09/25/2013 1320   BUN 16.1 02/12/2014 0950   BUN 9 09/25/2013 1320   CREATININE 1.0 02/12/2014 0950   CREATININE 0.82 09/25/2013 1320      Component Value Date/Time   CALCIUM 9.5 02/12/2014 0950   CALCIUM 10.0 09/25/2013 1320   ALKPHOS 65 02/12/2014 0950   ALKPHOS 42 10/04/2012 1830   AST 22 02/12/2014 0950   AST 32 10/04/2012 1830   ALT 22 02/12/2014 0950   ALT 27 10/04/2012 1830   BILITOT 0.72 02/12/2014 0950   BILITOT 0.7 10/04/2012 1830       No results found for this basename: LABCA2    No components found with this basename: LABCA125    No results found for this basename: INR,  in the last 168 hours  Urinalysis    Component Value Date/Time   LABSPEC 1.005 10/09/2013 1646   GLUCOSEU Negative 10/09/2013 1646   UROBILINOGEN 0.2 10/09/2013 1646    STUDIES: No results found.  ASSESSMENT: 53 y.o. [place], [place] woman with clinical stage  T2Nx IIA invasive ductal carcinoma, grade 3, ER positive, HER-2/neu positive invasive ductal carcinoma of the right breast.   1.  Patient was started on neoadjuvant Docetaxel, Carboplatin, Trastuzumab, Pertuzumab that started on 10/02/13. She receives treatment on day 1 of a 21 day cycle with Neulasta given on day 2 for granulocyte support. The patient received 4 cycles and due to fluid retention and neuropathy the Docetaxel was discontinued.  Gemcitabine was given at 832m/m2 instead, with no other changes made to her chemotherapy regimen.    2. The patient does receive Zoladex every 28 days.      PLAN:   Mrs. MEmmerichis doing well today.  Her lab work is stable. I reviewed this with her in detail.   She will proceed with Trastuzumab today.    In regards to her swollen lymph node, it is not tender and she has had no recent infection.  I recommended that she monitor it, and if it worsens, persists, or becomes tender to call [place]    Cardiac: Her last echocardiogram on 11/30/2013 demonstrated a LVEF of 65-70%. She was evaluated by Dr. BVaughan Browneron 11/30/13 in the cardio-onc clinic. He reviewed her echo, and cleared her to continue with Herceptin. She will be re-evaluated by Dr. BHaroldine Lawson 03/11/14.   She She knows to call [place] the interim for any questions or concerns.  We can certainly see her sooner if needed.  I spent 25 minutes counseling the patient face to face.  The total time spent in the appointment was 30 minutes.  LMinette Headland [phone]/17/2015 10:48 AM

## 2014-02-12 NOTE — Telephone Encounter (Signed)
[facility] PT APPT SCHEDULE FOR AUG/SEPT INCLUDING APPTS  W/BENSIMHON. PER 02/12/14 POF NEXT HERCEPTIN 8/14 AND NEXT TX SCHEDULED FOR 3WKS AFTER 8/14. PT SCHEDULED W/LC 8/14 DUE TO SCHEDULE FOR [facility] NOT CONFIRMED.

## 2014-02-12 NOTE — Progress Notes (Signed)
Pt has developed what seems to be a trigger finger to left thumb. I have set an appt for pt to be seen at [facility] with Dr. Lorin Mercy July 29,2015 @9 :30am per Charlestine Massed, NP. I have called pt and given her the date and time of appt.

## 2014-02-22 ENCOUNTER — Encounter (HOSPITAL_BASED_OUTPATIENT_CLINIC_OR_DEPARTMENT_OTHER): Payer: Self-pay | Admitting: *Deleted

## 2014-02-22 NOTE — Progress Notes (Signed)
02/22/14 1634  OBSTRUCTIVE SLEEP APNEA  Do you snore loudly (loud enough to be heard through closed doors)?  1  Do you often feel tired, fatigued, or sleepy during the daytime? 0  Has anyone observed you stop breathing during your sleep? 1  Do you have, or are you being treated for high blood pressure? 1  BMI more than 35 kg/m2? 0  Age over 51 years old? 1  Neck circumference greater than 40 cm/16 inches? 1  Gender: 0  Obstructive Sleep Apnea Score 5  Score 4 or greater  Results sent to PCP

## 2014-02-22 NOTE — Progress Notes (Signed)
Pt had pac and chemo 2/15-labs done cc-02/12/14-cannot find ekg-may need one

## 2014-02-24 ENCOUNTER — Other Ambulatory Visit (HOSPITAL_COMMUNITY): Payer: BC Managed Care – PPO

## 2014-02-25 ENCOUNTER — Encounter: Payer: Self-pay | Admitting: *Deleted

## 2014-02-26 ENCOUNTER — Encounter (HOSPITAL_BASED_OUTPATIENT_CLINIC_OR_DEPARTMENT_OTHER)
Admission: RE | Admit: 2014-02-26 | Discharge: 2014-02-26 | Disposition: A | Discharge status: Home / Self Care | Payer: BC Managed Care – PPO | Source: Ambulatory Visit | Attending: General Surgery | Admitting: General Surgery

## 2014-02-26 ENCOUNTER — Ambulatory Visit
Admission: RE | Admit: 2014-02-26 | Discharge: 2014-02-26 | Disposition: A | Discharge status: Home / Self Care | Payer: BC Managed Care – PPO | Source: Ambulatory Visit | Attending: General Surgery | Admitting: General Surgery

## 2014-02-26 DIAGNOSIS — C50511 Malignant neoplasm of lower-outer quadrant of right female breast: Secondary | ICD-10-CM

## 2014-03-01 ENCOUNTER — Encounter (HOSPITAL_BASED_OUTPATIENT_CLINIC_OR_DEPARTMENT_OTHER): Payer: Self-pay | Admitting: *Deleted

## 2014-03-01 ENCOUNTER — Encounter (HOSPITAL_COMMUNITY)
Admission: RE | Admit: 2014-03-01 | Discharge: 2014-03-01 | Disposition: A | Discharge status: Home / Self Care | Payer: BC Managed Care – PPO | Source: Ambulatory Visit | Attending: General Surgery | Admitting: General Surgery

## 2014-03-01 ENCOUNTER — Ambulatory Visit (HOSPITAL_BASED_OUTPATIENT_CLINIC_OR_DEPARTMENT_OTHER): Payer: BC Managed Care – PPO | Admitting: Anesthesiology

## 2014-03-01 ENCOUNTER — Encounter (HOSPITAL_BASED_OUTPATIENT_CLINIC_OR_DEPARTMENT_OTHER): Payer: BC Managed Care – PPO | Admitting: Anesthesiology

## 2014-03-01 ENCOUNTER — Ambulatory Visit
Admission: RE | Admit: 2014-03-01 | Discharge: 2014-03-01 | Disposition: A | Discharge status: Home / Self Care | Payer: BC Managed Care – PPO | Source: Ambulatory Visit | Attending: General Surgery | Admitting: General Surgery

## 2014-03-01 ENCOUNTER — Ambulatory Visit (HOSPITAL_BASED_OUTPATIENT_CLINIC_OR_DEPARTMENT_OTHER)
Admission: RE | Admit: 2014-03-01 | Discharge: 2014-03-01 | Disposition: A | Discharge status: Home / Self Care | Payer: BC Managed Care – PPO | Source: Ambulatory Visit | Attending: General Surgery | Admitting: General Surgery

## 2014-03-01 ENCOUNTER — Encounter (HOSPITAL_BASED_OUTPATIENT_CLINIC_OR_DEPARTMENT_OTHER)
Admission: RE | Disposition: A | Discharge status: Home / Self Care | Payer: Self-pay | Source: Ambulatory Visit | Attending: General Surgery

## 2014-03-01 DIAGNOSIS — C50919 Malignant neoplasm of unspecified site of unspecified female breast: Secondary | ICD-10-CM

## 2014-03-01 DIAGNOSIS — G609 Hereditary and idiopathic neuropathy, unspecified: Secondary | ICD-10-CM | POA: Insufficient documentation

## 2014-03-01 DIAGNOSIS — I10 Essential (primary) hypertension: Secondary | ICD-10-CM | POA: Insufficient documentation

## 2014-03-01 DIAGNOSIS — Z7982 Long term (current) use of aspirin: Secondary | ICD-10-CM | POA: Insufficient documentation

## 2014-03-01 DIAGNOSIS — Z79899 Other long term (current) drug therapy: Secondary | ICD-10-CM | POA: Insufficient documentation

## 2014-03-01 DIAGNOSIS — C50511 Malignant neoplasm of lower-outer quadrant of right female breast: Secondary | ICD-10-CM

## 2014-03-01 DIAGNOSIS — Z87891 Personal history of nicotine dependence: Secondary | ICD-10-CM | POA: Insufficient documentation

## 2014-03-01 DIAGNOSIS — Z9221 Personal history of antineoplastic chemotherapy: Secondary | ICD-10-CM | POA: Insufficient documentation

## 2014-03-01 HISTORY — PX: BREAST SURGERY: SHX581

## 2014-03-01 SURGERY — RADIOACTIVE SEED GUIDED PARTIAL MASTECTOMY WITH AXILLARY SENTINEL LYMPH NODE BIOPSY
Anesthesia: General | Site: Breast | Laterality: Right

## 2014-03-01 MED ORDER — OXYCODONE HCL 5 MG PO TABS: 5.0000 mg | ORAL_TABLET | Freq: Four times a day (QID) | ORAL | Status: DC | PRN

## 2014-03-01 MED ORDER — HYDROMORPHONE HCL PF 1 MG/ML IJ SOLN
INTRAMUSCULAR | Status: AC
  Filled 2014-03-01: qty 1

## 2014-03-01 MED ORDER — CEFAZOLIN SODIUM-DEXTROSE 2-3 GM-% IV SOLR
INTRAVENOUS | Status: AC
  Filled 2014-03-01: qty 50

## 2014-03-01 MED ORDER — MIDAZOLAM HCL 2 MG/2ML IJ SOLN
INTRAMUSCULAR | Status: AC
  Filled 2014-03-01: qty 2

## 2014-03-01 MED ORDER — SODIUM CHLORIDE 0.9 % IJ SOLN
INTRAMUSCULAR | Status: AC
  Filled 2014-03-01: qty 10

## 2014-03-01 MED ORDER — DEXAMETHASONE SODIUM PHOSPHATE 10 MG/ML IJ SOLN
INTRAMUSCULAR | Status: DC | PRN
  Administered 2014-03-01: 10 mg via INTRAVENOUS

## 2014-03-01 MED ORDER — LIDOCAINE HCL (CARDIAC) 20 MG/ML IV SOLN
INTRAVENOUS | Status: DC | PRN
  Administered 2014-03-01: 100 mg via INTRAVENOUS

## 2014-03-01 MED ORDER — LACTATED RINGERS IV SOLN
INTRAVENOUS | Status: DC
  Administered 2014-03-01: 07:00:00 via INTRAVENOUS

## 2014-03-01 MED ORDER — OXYCODONE HCL 5 MG PO TABS: 5.0000 mg | ORAL_TABLET | Freq: Once | ORAL | Status: DC | PRN

## 2014-03-01 MED ORDER — ONDANSETRON HCL 4 MG/2ML IJ SOLN
INTRAMUSCULAR | Status: DC | PRN
  Administered 2014-03-01: 4 mg via INTRAVENOUS

## 2014-03-01 MED ORDER — PROPOFOL INFUSION 10 MG/ML OPTIME
INTRAVENOUS | Status: DC | PRN
  Administered 2014-03-01: 180 mL via INTRAVENOUS

## 2014-03-01 MED ORDER — CEFAZOLIN SODIUM-DEXTROSE 2-3 GM-% IV SOLR
2.0000 g | INTRAVENOUS | Status: AC
  Administered 2014-03-01: 2 g via INTRAVENOUS

## 2014-03-01 MED ORDER — HYDROMORPHONE HCL PF 1 MG/ML IJ SOLN
0.2500 mg | INTRAMUSCULAR | Status: DC | PRN
  Administered 2014-03-01 (×2): 0.5 mg via INTRAVENOUS

## 2014-03-01 MED ORDER — BUPIVACAINE HCL (PF) 0.25 % IJ SOLN
INTRAMUSCULAR | Status: DC | PRN
  Administered 2014-03-01: 10 mL

## 2014-03-01 MED ORDER — PROMETHAZINE HCL 25 MG/ML IJ SOLN: 6.2500 mg | INTRAMUSCULAR | Status: DC | PRN

## 2014-03-01 MED ORDER — FENTANYL CITRATE 0.05 MG/ML IJ SOLN
INTRAMUSCULAR | Status: DC | PRN
  Administered 2014-03-01: 50 ug via INTRAVENOUS

## 2014-03-01 MED ORDER — FENTANYL CITRATE 0.05 MG/ML IJ SOLN
50.0000 ug | INTRAMUSCULAR | Status: DC | PRN
  Administered 2014-03-01 (×2): 50 ug via INTRAVENOUS

## 2014-03-01 MED ORDER — BUPIVACAINE HCL (PF) 0.25 % IJ SOLN
INTRAMUSCULAR | Status: AC
  Filled 2014-03-01: qty 30

## 2014-03-01 MED ORDER — BUPIVACAINE-EPINEPHRINE (PF) 0.5% -1:200000 IJ SOLN
INTRAMUSCULAR | Status: DC | PRN
  Administered 2014-03-01: 30 mL

## 2014-03-01 MED ORDER — OXYCODONE HCL 5 MG/5ML PO SOLN: 5.0000 mg | Freq: Once | ORAL | Status: DC | PRN

## 2014-03-01 MED ORDER — MIDAZOLAM HCL 2 MG/2ML IJ SOLN
1.0000 mg | INTRAMUSCULAR | Status: DC | PRN
  Administered 2014-03-01 (×2): 1 mg via INTRAVENOUS

## 2014-03-01 MED ORDER — MORPHINE SULFATE 2 MG/ML IJ SOLN: 1.0000 mg | INTRAMUSCULAR | Status: DC | PRN

## 2014-03-01 MED ORDER — FENTANYL CITRATE 0.05 MG/ML IJ SOLN
INTRAMUSCULAR | Status: AC
  Filled 2014-03-01: qty 4

## 2014-03-01 MED ORDER — MIDAZOLAM HCL 5 MG/5ML IJ SOLN
INTRAMUSCULAR | Status: DC | PRN
  Administered 2014-03-01: 2 mg via INTRAVENOUS

## 2014-03-01 MED ORDER — METHYLENE BLUE 1 % INJ SOLN
INTRAMUSCULAR | Status: AC
  Filled 2014-03-01: qty 10

## 2014-03-01 MED ORDER — TECHNETIUM TC 99M SULFUR COLLOID FILTERED
1.0000 | Freq: Once | INTRAVENOUS | Status: AC | PRN
  Administered 2014-03-01: 1 via INTRADERMAL

## 2014-03-01 MED ORDER — FENTANYL CITRATE 0.05 MG/ML IJ SOLN
INTRAMUSCULAR | Status: AC
  Filled 2014-03-01: qty 2

## 2014-03-01 MED ORDER — EPHEDRINE SULFATE 50 MG/ML IJ SOLN
INTRAMUSCULAR | Status: DC | PRN
  Administered 2014-03-01: 15 mg via INTRAVENOUS
  Administered 2014-03-01 (×2): 10 mg via INTRAVENOUS

## 2014-03-01 MED ORDER — SODIUM CHLORIDE 0.9 % IJ SOLN
INTRAMUSCULAR | Status: DC | PRN
  Administered 2014-03-01: 09:00:00

## 2014-03-01 SURGICAL SUPPLY — 63 items
ADH SKN CLS APL DERMABOND .7 (GAUZE/BANDAGES/DRESSINGS) ×1
APL SKNCLS STERI-STRIP NONHPOA (GAUZE/BANDAGES/DRESSINGS) ×1
APPLIER CLIP 9.375 MED OPEN (MISCELLANEOUS) ×2
APR CLP MED 9.3 20 MLT OPN (MISCELLANEOUS) ×1
BENZOIN TINCTURE PRP APPL 2/3 (GAUZE/BANDAGES/DRESSINGS) ×2 IMPLANT
BINDER BREAST LRG (GAUZE/BANDAGES/DRESSINGS) IMPLANT
BINDER BREAST MEDIUM (GAUZE/BANDAGES/DRESSINGS) IMPLANT
BINDER BREAST XLRG (GAUZE/BANDAGES/DRESSINGS) ×2 IMPLANT
BINDER BREAST XXLRG (GAUZE/BANDAGES/DRESSINGS) IMPLANT
BLADE SURG 15 STRL LF DISP TIS (BLADE) ×1 IMPLANT
BLADE SURG 15 STRL SS (BLADE) ×2
CANISTER SUC SOCK COL 7IN (MISCELLANEOUS) IMPLANT
CANISTER SUCT 1200ML W/VALVE (MISCELLANEOUS) IMPLANT
CHLORAPREP W/TINT 26ML (MISCELLANEOUS) ×2 IMPLANT
CLIP APPLIE 9.375 MED OPEN (MISCELLANEOUS) ×1 IMPLANT
COVER MAYO STAND STRL (DRAPES) ×2 IMPLANT
COVER PROBE W GEL 5X96 (DRAPES) ×2 IMPLANT
COVER TABLE BACK 60X90 (DRAPES) ×2 IMPLANT
DECANTER SPIKE VIAL GLASS SM (MISCELLANEOUS) IMPLANT
DERMABOND ADVANCED (GAUZE/BANDAGES/DRESSINGS) ×1
DERMABOND ADVANCED .7 DNX12 (GAUZE/BANDAGES/DRESSINGS) ×1 IMPLANT
DEVICE DUBIN W/COMP PLATE 8390 (MISCELLANEOUS) ×2 IMPLANT
DRAPE LAPAROSCOPIC ABDOMINAL (DRAPES) ×2 IMPLANT
DRSG TEGADERM 4X4.75 (GAUZE/BANDAGES/DRESSINGS) ×2 IMPLANT
ELECT COATED BLADE 2.86 ST (ELECTRODE) ×2 IMPLANT
ELECT REM PT RETURN 9FT ADLT (ELECTROSURGICAL) ×2
ELECTRODE REM PT RTRN 9FT ADLT (ELECTROSURGICAL) ×1 IMPLANT
GLOVE BIO SURGEON STRL SZ7 (GLOVE) ×4 IMPLANT
GLOVE BIOGEL PI IND STRL 6.5 (GLOVE) ×1 IMPLANT
GLOVE BIOGEL PI IND STRL 7.5 (GLOVE) ×1 IMPLANT
GLOVE BIOGEL PI INDICATOR 6.5 (GLOVE) ×1
GLOVE BIOGEL PI INDICATOR 7.5 (GLOVE) ×1
GLOVE ECLIPSE 6.5 STRL STRAW (GLOVE) ×1 IMPLANT
GOWN STRL REUS W/ TWL LRG LVL3 (GOWN DISPOSABLE) ×2 IMPLANT
GOWN STRL REUS W/TWL LRG LVL3 (GOWN DISPOSABLE) ×4
KIT MARKER MARGIN INK (KITS) ×2 IMPLANT
NDL SAFETY ECLIPSE 18X1.5 (NEEDLE) ×1 IMPLANT
NEEDLE HYPO 18GX1.5 SHARP (NEEDLE) ×2
NEEDLE HYPO 25X1 1.5 SAFETY (NEEDLE) ×4 IMPLANT
NS IRRIG 1000ML POUR BTL (IV SOLUTION) ×2 IMPLANT
PACK BASIN DAY SURGERY FS (CUSTOM PROCEDURE TRAY) ×2 IMPLANT
PENCIL BUTTON HOLSTER BLD 10FT (ELECTRODE) ×2 IMPLANT
SHEET MEDIUM DRAPE 40X70 STRL (DRAPES) IMPLANT
SLEEVE SCD COMPRESS KNEE MED (MISCELLANEOUS) ×2 IMPLANT
SPONGE GAUZE 4X4 12PLY STER LF (GAUZE/BANDAGES/DRESSINGS) ×2 IMPLANT
SPONGE LAP 4X18 X RAY DECT (DISPOSABLE) ×2 IMPLANT
STAPLER VISISTAT 35W (STAPLE) ×2 IMPLANT
STOCKINETTE IMPERVIOUS LG (DRAPES) IMPLANT
STRIP CLOSURE SKIN 1/2X4 (GAUZE/BANDAGES/DRESSINGS) ×2 IMPLANT
SUT ETHILON 2 0 FS 18 (SUTURE) IMPLANT
SUT MNCRL AB 4-0 PS2 18 (SUTURE) ×2 IMPLANT
SUT MON AB 5-0 PS2 18 (SUTURE) ×2 IMPLANT
SUT SILK 2 0 SH (SUTURE) IMPLANT
SUT VIC AB 2-0 SH 27 (SUTURE) ×4
SUT VIC AB 2-0 SH 27XBRD (SUTURE) ×1 IMPLANT
SUT VIC AB 3-0 SH 27 (SUTURE) ×2
SUT VIC AB 3-0 SH 27X BRD (SUTURE) ×1 IMPLANT
SUT VIC AB 5-0 PS2 18 (SUTURE) IMPLANT
SYR CONTROL 10ML LL (SYRINGE) ×4 IMPLANT
TOWEL OR 17X24 6PK STRL BLUE (TOWEL DISPOSABLE) ×2 IMPLANT
TOWEL OR NON WOVEN STRL DISP B (DISPOSABLE) ×2 IMPLANT
TUBE CONNECTING 20X1/4 (TUBING) IMPLANT
YANKAUER SUCT BULB TIP NO VENT (SUCTIONS) IMPLANT

## 2014-03-01 NOTE — Op Note (Signed)
Preoperative diagnosis: clinical stage II right breast cancer, s/p primary chemotherapy Postoperative diagnosis: same as above Procedure:  1. Right breast radioactive seed guided lumpectomy #2 right axillar similar lymph node biopsy #3 injection of blue dye for sentinel node identification Surgeon: Dr. Serita Grammes Anesthesia: Gen. With pectoral block Specimens: #1 right breast tissue marked with paint #2 right axillary sentinel node x2 with counts of 150 and 1372 Drains: None Complications: None Estimated blood loss: Minimal Disposition to recovery stable  Indications: This is a 53 year old female with a clinical stage II right breast cancer that his HER-2/neu amplified. She is undergone primary chemotherapy. This mass is disappeared on MRI. We discussed her options and decided to proceed with a lumpectomy and a sentinel lymph node biopsy.  Procedure: She first had a radioactive seed placed at the breast center. I had these mammograms in the operating room.  She then underwent a pectoral block as well as injection of technetium in the standard periareolar fashion. She was given cefazolin. Sequential compression devices on her legs. She was in place a general anesthesia without complication. Her right breast was then prepped and draped in the standard sterile surgical fashion. A surgical timeout was then performed. I injected a mixture of methylene blue dye and saline in a periareolar fashion and massaged this.  I located the seed. I made a periareolar incision on the right side. I then used the neoprobe to guide a lumpectomy with an attempt to get a negative margin around the seed. This was then removed in total. This was marked with paint. A Faxitron mammogram confirmed removal of the seed and a clip. This was confirmed by radiology. This was then sent to pathology who confirmed removal of the seed. I then placed 2 clips deep. I placed one clip in each position around the cavity. Hemostasis  was obtained. I then closed the breast tissue with 2-0 Vicryl. The dermis was closed with 3-0 Vicryl and the skin with 5-0 Monocryl. Dermabond and Steri-Strips are placed over this.  I then made a small incision underneath the right axillary hairline. I went through the axillary fascia. I then used the neoprobe to identify 2 areas of sentinel nodes. Both of these were blue. The first area likely has a couple of nodes in it but were inseparable. The other area appeared to have one small node in it. There was no background radioactivity or any further blue dye. Hemostasis was obtained. A close this with 2-0 Vicryl, 3-0 Vicryl, 4 Monocryl. Dermabond and Steri-Strips was also placed. She tolerated this well was extubated and transferred to the recovery room stable.

## 2014-03-01 NOTE — Interval H&P Note (Signed)
History and Physical Interval Note:  03/01/2014 8:23 AM  Tina [name]  has presented today for surgery, with the diagnosis of right breast cancer  The various methods of treatment have been discussed with the patient and family. After consideration of risks, benefits and other options for treatment, the patient has consented to  Procedure(s): RIGHT BREAST RADIOACTIVE SEED LUMPECTOMY  WITH RIGHT AXILLARY SENTINEL LYMPH NODE BIOPSY (Right) as a surgical intervention .  The patient's history has been reviewed, patient examined, no change in status, stable for surgery.  I have reviewed the patient's chart and labs.  Questions were answered to the patient's satisfaction.     [name]

## 2014-03-01 NOTE — H&P (View-Only) (Signed)
Patient ID: Tina [name], female   DOB: March 18, 1961, 53 y.o.   MRN: [id number]  Chief Complaint  Patient presents with  . eval right breast ca    HPI Tina [name] is a 53 y.o. female.   HPI This is a 53 year old female who initially presented in February of 2015. At that point in time she appeared to have a clinical stage I right breast cancer. On MRI this did appear to be a little bit larger. There was another abnormality on the left side that was biopsied and was negative. There was a indeterminate right axillary node that was biopsied was also negative. The tumor was biopsied and was a grade 3 invasive ductal carcinoma with ductal carcinoma in situ there is ER-positive is 70%, PR positive at 6%, HER-2/neu was amplified and the proliferation marker was 70%. Her genetic testing is negative. She has undergone neoadjuvant chemotherapy with Herceptin and perjeta. She's done very well from this. She's only had some peripheral neuropathy which has been taking care of with gabapentin. Her repeat MRI at the end of chemotherapy shows resolution of her right breast mass. There are no real worrisome areas of the left breast and no abnormal lymph nodes. She comes in today to discuss her eventual surgery. She completed chemotherapy 22nd of June. Past Medical History  Diagnosis Date  . Leukocytopenia   . Hypertension   . Degenerative joint disease   . Leukopenia 06/16/2013  . Needs flu shot 06/17/2013  . Cancer of right breast 09/03/13    Invasive Ductal Carcinoma/Ductal Carcinoma Insitu    Past Surgical History  Procedure Laterality Date  . Cesarean section    . Carpal tunnel release    . Trigger finger release    . Portacath placement      Family History  Problem Relation Age of Onset  . Hypertension Mother   . Dementia Mother     small vessel disease  . Cancer Mother 30    cervical  cancer  . Cancer Father 30    prostate ca  . Diabetes Father   . Arthritis Father     Social  History History  Substance Use Topics  . Smoking status: Former Smoker -- 1.00 packs/day for 15 years    Types: Cigars, Cigarettes  . Smokeless tobacco: Never Used     Comment: remote smoking history  . Alcohol Use: 0.0 oz/week    0 Shots of liquor per week     Comment: occ     Allergies  Allergen Reactions  . Adhesive [Tape]   . Nickel     Current Outpatient Prescriptions  Medication Sig Dispense Refill  . ascorbic acid (VITAMIN C) 1000 MG tablet Take 1,000 mg by mouth daily.      Marland Kitchen aspirin 81 MG tablet Take 81 mg by mouth daily.      Marland Kitchen b complex vitamins capsule Take 1 capsule by mouth daily.      Marland Kitchen BIOTIN 5000 PO Take by mouth daily.       . Calcium Carbonate-Vit D-Min (CALTRATE 600+D PLUS MINERALS PO) Take 1 tablet by mouth daily.      . Cholecalciferol (VITAMIN D3) 10000 UNITS capsule Take 10,000 Units by mouth daily.      . Coenzyme Q10 (CO Q 10 PO) Take by mouth daily.      Marland Kitchen gabapentin (NEURONTIN) 100 MG capsule Take 3 capsules (300 mg total) by mouth 3 (three) times daily.  270 capsule  2  . L-ARGININE PO  Take by mouth daily.      Marland Kitchen lidocaine-prilocaine (EMLA) cream Apply topically as needed.  30 g  6  . lisinopril-hydrochlorothiazide (PRINZIDE,ZESTORETIC) 20-12.5 MG per tablet Take 2 tablets by mouth daily.  120 tablet  1  . LORazepam (ATIVAN) 0.5 MG tablet Take 1 tablet (0.5 mg total) by mouth every 6 (six) hours as needed (Nausea or vomiting).  30 tablet  0  . omeprazole (PRILOSEC) 40 MG capsule Take 1 capsule (40 mg total) by mouth daily.  30 capsule  6  . OVER THE COUNTER MEDICATION Take 1 tablet by mouth daily. Trace Minerals      . oxyCODONE (OXY IR/ROXICODONE) 5 MG immediate release tablet Take 1 tablet (5 mg total) by mouth every 4 (four) hours as needed for severe pain.  60 tablet  0  . Probiotic Product (TRUBIOTICS) CAPS Take 1 capsule by mouth daily.      Marland Kitchen VITAMIN E PO Take 1 capsule by mouth daily.        No current facility-administered medications for  this visit.   Facility-Administered Medications Ordered in Other Visits  Medication Dose Route Frequency Provider Last Rate Last Dose  . 0.9 %  sodium chloride infusion   Intravenous Continuous Minette Headland, NP 1,000 mL/hr at 12/26/13 0915    . prochlorperazine (COMPAZINE) injection 10 mg  10 mg Intravenous Q6H PRN Minette Headland, NP   10 mg at 12/26/13 9381    Review of Systems Review of Systems  Constitutional: Negative for fever, chills and unexpected weight change.  HENT: Negative for congestion, hearing loss, sore throat, trouble swallowing and voice change.   Eyes: Negative for visual disturbance.  Respiratory: Negative for cough and wheezing.   Cardiovascular: Negative for chest pain, palpitations and leg swelling.  Gastrointestinal: Negative for nausea, vomiting, abdominal pain, diarrhea, constipation, blood in stool, abdominal distention and anal bleeding.  Genitourinary: Negative for hematuria, vaginal bleeding and difficulty urinating.  Musculoskeletal: Negative for arthralgias.  Skin: Negative for rash and wound.  Neurological: Negative for seizures, syncope and headaches.  Hematological: Negative for adenopathy. Does not bruise/bleed easily.  Psychiatric/Behavioral: Negative for confusion.    Blood pressure 134/78, pulse 84, temperature 97.5 F (36.4 C), resp. rate 16, height _0  (1.626 m), weight 189 lb (85.73 kg).  Physical Exam Physical Exam  Vitals reviewed. Constitutional: She appears well-developed and well-nourished.  Eyes: No scleral icterus.  Neck: Neck supple.  Cardiovascular: Normal rate, regular rhythm and normal heart sounds.   Pulmonary/Chest: Effort normal and breath sounds normal. She has no wheezes. She has no rales. Right breast exhibits no inverted nipple, no mass, no nipple discharge, no skin change and no tenderness. Left breast exhibits no inverted nipple, no mass, no nipple discharge, no skin change and no tenderness.   Lymphadenopathy:    She has no cervical adenopathy.    She has no axillary adenopathy.       Right: No supraclavicular adenopathy present.       Left: No supraclavicular adenopathy present.    Data Reviewed EXAM  BILATERAL BREAST MRI WITH AND WITHOUT CONTRAST  TECHNIQUE:  Multiplanar, multisequence MR images of both breasts were obtained  prior to and following the intravenous administration of 92m of  MultiHance  THREE-DIMENSIONAL MR IMAGE RENDERING ON INDEPENDENT WORKSTATION:  Three-dimensional MR images were rendered by post-processing of the  original MR data on an independent workstation. The  three-dimensional MR images were interpreted, and findings are  reported in the following complete  MRI report for this study. Three  dimensional images were evaluated at the independent DynaCad  workstation  COMPARISON: 09/11/2013 breast MRI.  FINDINGS:  Breast composition: c. Heterogeneous fibroglandular tissue.  Background parenchymal enhancement: Mild  Right breast: The previously seen 2.2 cm irregular enhancing mass  located within the lower outer quadrant of the right breast is no  longer visualized. Known measurable mass remains. There are no new  foci of enhancement within the right breast.  Left breast: The 8 mm oval enhancing nodule within the upper outer  quadrant left breast which has been showed to represent a benign  intramammary lymph node is stable. There are no worrisome areas of  enhancement within the left breast.  Lymph nodes: No abnormal appearing lymph nodes.  Ancillary findings: None.  IMPRESSION:  Considerable improvement in the previously seen enhancing mass  within the lower outer quadrant of the right breast. No visible  enhancing mass remains.   Assessment    Clinical stage I right breast cancer s/p primary chemotherapy     Plan    Right breast radioactive seed guided lumpectomy, right axillary sentinel node biopsy     We discussed her very  good radiologic result and reason we still needed to do breast surgery and node biopsy.  We reviewed the options below again.    We discussed a sentinel lymph node biopsy as she does not appear to having lymph node involvement right now. We discussed the performance of that with injection of radioactive tracer and blue dye. We discussed that she would have an incision underneath her axillary hairline. We discussed that there is a chance of having a positive node with a sentinel lymph node biopsy and we will await the permanent pathology to make any other first further decisions in terms of her treatment. One of these options might be to return to the operating room to perform an axillary lymph node dissection. We discussed up to a 5% risk lifetime of chronic shoulder pain as well as lymphedema associated with a sentinel lymph node biopsy.  We discussed the options for treatment of the breast cancer which included lumpectomy versus a mastectomy. We discussed the performance of the lumpectomy with seed placement. We discussed a 5% chance of a positive margin requiring reexcision in the operating room. We also discussed that she will need radiation therapy if she undergoes lumpectomy. We discussed the mastectomy and the postoperative care for that as well. We discussed reconstruction options also. We discussed that there is no difference in her survival whether she undergoes lumpectomy with radiation therapy or antiestrogen therapy versus a mastectomy.  We discussed the risks of operation including bleeding, infection, possible reoperation. We will proceed somewhere 3-4 weeks after chemotherapy     Ariyah Sedlack 02/02/2014, 9:49 AM

## 2014-03-01 NOTE — Anesthesia Preprocedure Evaluation (Addendum)
Anesthesia Evaluation  Patient identified by MRN, date of birth, ID band Patient awake    History of Anesthesia Complications Negative for: history of anesthetic complications  Airway Mallampati: I  Neck ROM: Full    Dental no notable dental hx. (+) Teeth Intact, Edentulous Lower   Pulmonary neg pulmonary ROS, former smoker,  breath sounds clear to auscultation        Cardiovascular Exercise Tolerance: Good hypertension, Pt. on medications Rhythm:Regular Rate:Normal     Neuro/Psych    GI/Hepatic Neg liver ROS, GERD-  Medicated,  Endo/Other  negative endocrine ROS  Renal/GU negative Renal ROS     Musculoskeletal   Abdominal   Peds  Hematology negative hematology ROS (+)   Anesthesia Other Findings   Reproductive/Obstetrics                      Anesthesia Physical Anesthesia Plan  ASA: II  Anesthesia Plan: General   Post-op Pain Management:    Induction: Intravenous  Airway Management Planned: LMA  Additional Equipment:   Intra-op Plan:   Post-operative Plan: Extubation in OR  Informed Consent: I have reviewed the patients History and Physical, chart, labs and discussed the procedure including the risks, benefits and alternatives for the proposed anesthesia with the patient or authorized representative who has indicated his/her understanding and acceptance.   Dental advisory given  Plan Discussed with: CRNA and Surgeon  Anesthesia Plan Comments:         Anesthesia Quick Evaluation

## 2014-03-01 NOTE — Discharge Instructions (Signed)
[facility] °Office Phone Number [phone] ° °BREAST BIOPSY/ PARTIAL MASTECTOMY: POST OP INSTRUCTIONS ° °Always review your discharge instruction sheet given to you by the facility where your surgery was performed. ° °IF YOU HAVE DISABILITY OR FAMILY LEAVE FORMS, YOU MUST BRING THEM TO THE OFFICE FOR PROCESSING.  DO NOT GIVE THEM TO YOUR DOCTOR. ° °1. A prescription for pain medication may be given to you upon discharge.  Take your pain medication as prescribed, if needed.  If narcotic pain medicine is not needed, then you may take acetaminophen (Tylenol), naprosyn (Alleve) or ibuprofen (Advil) as needed. °2. Take your usually prescribed medications unless otherwise directed °3. If you need a refill on your pain medication, please contact your pharmacy.  They will contact our office to request authorization.  Prescriptions will not be filled after 5pm or on week-ends. °4. You should eat very light the first 24 hours after surgery, such as soup, crackers, pudding, etc.  Resume your normal diet the day after surgery. °5. Most patients will experience some swelling and bruising in the breast.  Ice packs and a good support bra will help.  Wear the breast binder provided or a sports bra for 72 hours day and night.  After that wear a sports bra during the day until you return to the office. Swelling and bruising can take several days to resolve.  °6. It is common to experience some constipation if taking pain medication after surgery.  Increasing fluid intake and taking a stool softener will usually help or prevent this problem from occurring.  A mild laxative (Milk of Magnesia or Miralax) should be taken according to package directions if there are no bowel movements after 48 hours. °7. Unless discharge instructions indicate otherwise, you may remove your bandages 48 hours after surgery and you may shower at that time.  You may have steri-strips (small skin tapes) in place directly over the incision.   These strips should be left on the skin for 7-10 days and will come off on their own.  If your surgeon used skin glue on the incision, you may shower in 24 hours.  The glue will flake off over the next 2-3 weeks.  Any sutures or staples will be removed at the office during your follow-up visit. °8. ACTIVITIES:  You may resume regular daily activities (gradually increasing) beginning the next day.  Wearing a good support bra or sports bra minimizes pain and swelling.  You may have sexual intercourse when it is comfortable. °a. You may drive when you no longer are taking prescription pain medication, you can comfortably wear a seatbelt, and you can safely maneuver your car and apply brakes. °b. RETURN TO WORK:  ______________________________________________________________________________________ °9. You should see your doctor in the office for a follow-up appointment approximately two weeks after your surgery.  Your doctor’s nurse will typically make your follow-up appointment when she calls you with your pathology report.  Expect your pathology report 3-4 business days after your surgery.  You may call to check if you do not hear from us after three days. °10. OTHER INSTRUCTIONS: _______________________________________________________________________________________________ _____________________________________________________________________________________________________________________________________ °_____________________________________________________________________________________________________________________________________ °_____________________________________________________________________________________________________________________________________ ° °WHEN TO CALL DR WAKEFIELD: °1. Fever over 101.0 °2. Nausea and/or vomiting. °3. Extreme swelling or bruising. °4. Continued bleeding from incision. °5. Increased pain, redness, or drainage from the incision. ° °The clinic staff is available to  answer your questions during regular business hours.  Please don’t hesitate to call and ask to speak to one of the nurses for   clinical concerns.  If you have a medical emergency, go to the nearest emergency room or call 911.  A surgeon from [facility] is always on call at the hospital. ° °For further questions, please visit [email or web address] ° °Post Anesthesia Home Care Instructions ° °Activity: °Get plenty of rest for the remainder of the day. A responsible adult should stay with you for 24 hours following the procedure.  °For the next 24 hours, DO NOT: °-Drive a car °-Operate machinery °-Drink alcoholic beverages °-Take any medication unless instructed by your physician °-Make any legal decisions or sign important papers. ° °Meals: °Start with liquid foods such as gelatin or soup. Progress to regular foods as tolerated. Avoid greasy, spicy, heavy foods. If nausea and/or vomiting occur, drink only clear liquids until the nausea and/or vomiting subsides. Call your physician if vomiting continues. ° °Special Instructions/Symptoms: °Your throat may feel dry or sore from the anesthesia or the breathing tube placed in your throat during surgery. If this causes discomfort, gargle with warm salt water. The discomfort should disappear within 24 hours. ° °

## 2014-03-01 NOTE — Progress Notes (Signed)
Radiology staff present for nuc med inj. Versed and fentanyl given for sedation. Pt tol well, See doc flowsheets.

## 2014-03-01 NOTE — Anesthesia Postprocedure Evaluation (Signed)
  Anesthesia Post-op Note  Patient: Tina [name]  Procedure(s) Performed: Procedure(s): RIGHT BREAST RADIOACTIVE SEED LUMPECTOMY  WITH RIGHT AXILLARY SENTINEL LYMPH NODE BIOPSY (Right)  Patient Location: PACU  Anesthesia Type: General   Level of Consciousness: awake, alert  and oriented  Airway and Oxygen Therapy: Patient Spontanous Breathing  Post-op Pain: mild  Post-op Assessment: Post-op Vital signs reviewed  Post-op Vital Signs: Reviewed  Last Vitals:  Filed Vitals:   03/01/14 1115  BP: 138/97  Pulse: 89  Temp:   Resp: 16    Complications: No apparent anesthesia complications

## 2014-03-01 NOTE — Progress Notes (Signed)
Assisted Dr. Massagee with right, ultrasound guided, pectoralis block. Side rails up, monitors on throughout procedure. See vital signs in flow sheet. Tolerated Procedure well. 

## 2014-03-01 NOTE — Transfer of Care (Signed)
Immediate Anesthesia Transfer of Care Note  Patient: Tina [name]  Procedure(s) Performed: Procedure(s): RIGHT BREAST RADIOACTIVE SEED LUMPECTOMY  WITH RIGHT AXILLARY SENTINEL LYMPH NODE BIOPSY (Right)  Patient Location: PACU  Anesthesia Type:General  Level of Consciousness: awake, alert , oriented and patient cooperative  Airway & Oxygen Therapy: Patient Spontanous Breathing and Patient connected to nasal cannula oxygen  Post-op Assessment: Report given to PACU RN and Post -op Vital signs reviewed and stable  Post vital signs: Reviewed and stable  Complications: No apparent anesthesia complications

## 2014-03-01 NOTE — Anesthesia Procedure Notes (Addendum)
Anesthesia Regional Block:  Pectoralis block  Pre-Anesthetic Checklist: ,, timeout performed, Correct Patient, Correct Site, Correct Laterality, Correct Procedure, Correct Position, site marked, Risks and benefits discussed,  Surgical consent,  Pre-op evaluation,  At surgeon's request and post-op pain management  Laterality: Right and Upper  Prep: chloraprep       Needles:   Needle Type: Echogenic Needle     Needle Length: 9cm 9 cm   Needle insertion depth: 8 cm   Additional Needles:  Procedures: ultrasound guided (picture in chart) Pectoralis block Narrative:  Start time: 03/01/2014 7:45 AM End time: 03/01/2014 8:00 AM Injection made incrementally with aspirations every 5 mL.  Performed by: Personally  Anesthesiologist: t Massagee  Additional Notes: Tolerated  well   Procedure Name: LMA Insertion Date/Time: 03/01/2014 8:48 AM Performed by: Wanita Chamberlain Pre-anesthesia Checklist: Patient identified, Timeout performed, Emergency Drugs available, Suction available and Patient being monitored Patient Re-evaluated:Patient Re-evaluated prior to inductionOxygen Delivery Method: Circle system utilized Preoxygenation: Pre-oxygenation with 100% oxygen Intubation Type: IV induction Ventilation: Mask ventilation without difficulty LMA: LMA with gastric port inserted LMA Size: 4.0 Number of attempts: 1 Placement Confirmation: positive ETCO2 Tube secured with: Tape Dental Injury: Teeth and Oropharynx as per pre-operative assessment

## 2014-03-02 NOTE — Addendum Note (Signed)
Addendum created 03/02/14 1429 by Christia Reading Taylar Hartsough   Modules edited: Charges VN

## 2014-03-05 ENCOUNTER — Telehealth (INDEPENDENT_AMBULATORY_CARE_PROVIDER_SITE_OTHER): Payer: Self-pay

## 2014-03-05 NOTE — Telephone Encounter (Signed)
Patient call ing into office to report that she has a blister that's formed over her nipple, patient states she's had mild drainage from her incision site.  Patient status post Right Breast Radioactive Seed Placement Lumpectomy with Axillary Lymph Node Biopsy on 03/01/14.  Patient denies having any swelling, redness, or warm to touch.  Patient denies having any fevers or chills.  Patient reports her drainage is mild, no odor.  Reviewed with Dr. [name] and advised to apply heat and ice.  Blistering may be from tape or skin prep.  Patient states she's been applying ice which has helped.  Patient advised to call our office if she has any questions or concerns.  Patient verbalized understanding.

## 2014-03-10 ENCOUNTER — Other Ambulatory Visit (HOSPITAL_COMMUNITY): Payer: Self-pay | Admitting: Vascular Surgery

## 2014-03-11 ENCOUNTER — Ambulatory Visit (HOSPITAL_BASED_OUTPATIENT_CLINIC_OR_DEPARTMENT_OTHER)
Admission: RE | Admit: 2014-03-11 | Discharge: 2014-03-11 | Disposition: A | Discharge status: Home / Self Care | Payer: BC Managed Care – PPO | Source: Ambulatory Visit | Attending: Internal Medicine | Admitting: Internal Medicine

## 2014-03-11 ENCOUNTER — Other Ambulatory Visit: Payer: Self-pay | Admitting: *Deleted

## 2014-03-11 ENCOUNTER — Encounter (HOSPITAL_COMMUNITY): Payer: Self-pay

## 2014-03-11 ENCOUNTER — Ambulatory Visit (HOSPITAL_COMMUNITY)
Admission: RE | Admit: 2014-03-11 | Discharge: 2014-03-11 | Disposition: A | Discharge status: Home / Self Care | Payer: BC Managed Care – PPO | Source: Ambulatory Visit | Attending: Family Medicine | Admitting: Family Medicine

## 2014-03-11 VITALS — BP 126/82 | HR 89 | Wt 189.8 lb

## 2014-03-11 DIAGNOSIS — I10 Essential (primary) hypertension: Secondary | ICD-10-CM

## 2014-03-11 DIAGNOSIS — C50919 Malignant neoplasm of unspecified site of unspecified female breast: Secondary | ICD-10-CM | POA: Insufficient documentation

## 2014-03-11 DIAGNOSIS — C50519 Malignant neoplasm of lower-outer quadrant of unspecified female breast: Secondary | ICD-10-CM

## 2014-03-11 DIAGNOSIS — I379 Nonrheumatic pulmonary valve disorder, unspecified: Secondary | ICD-10-CM

## 2014-03-11 DIAGNOSIS — Z0389 Encounter for observation for other suspected diseases and conditions ruled out: Secondary | ICD-10-CM | POA: Diagnosis not present

## 2014-03-11 DIAGNOSIS — C50511 Malignant neoplasm of lower-outer quadrant of right female breast: Secondary | ICD-10-CM

## 2014-03-11 NOTE — Patient Instructions (Signed)
Your physician has requested that you have an echocardiogram. Echocardiography is a painless test that uses sound waves to create images of your heart. It provides your doctor with information about the size and shape of your heart and how well your heart's chambers and valves are working. This procedure takes approximately one hour. There are no restrictions for this procedure.   Your physician recommends that you schedule a follow-up appointment in: 3 months with an echocardiogram  

## 2014-03-11 NOTE — Progress Notes (Signed)
  Echocardiogram 2D Echocardiogram has been performed.  Darlina Sicilian M 03/11/2014, 12:06 PM

## 2014-03-11 NOTE — Progress Notes (Signed)
Patient ID: Tina [name], female   DOB: 1960-09-07, 53 y.o.   MRN: [id number]  HPI:  Tina [name] is a 53 y/o woman with HTN, DJD and R breast cancer referred by Dr. Humphrey Rolls for enrollment into the cardio-oncology clinic.   Denies any h/o known cardiac issues.  Found to have R breast CA. Stage II(T2 NX) invasive ductal carcinoma with DCIS of the right breast. Biopsy revealed tumor to be ER+PR+ HER-2/neu positive with proliferation marker Ki-67 70%   Started neoadjuvant chemotherapy on 10/06/13 with TCH/P. Now getting Herceptin/perjeta  Doing great. 6 months into Herceptin. Occasional edema. No CP or SOB. BP well controlled.   Echo (3/15): EF 65% Lateral s' 12.9 cm/s GLS not measured  Echo (5/15): EF 60-65%, lateral S' 12.9 cm/sec, GLS -19.9%.  Echo (5/15): EF 60-65%, lateral S'  cm/sec, GLS -19.3%.   Review of Systems: All systems reviewed and negative except as per HPI.   Past Medical History  Diagnosis Date  . Leukocytopenia   . Hypertension   . Degenerative joint disease   . Leukopenia 06/16/2013  . Needs flu shot 06/17/2013  . Cancer of right breast 09/03/13    Invasive Ductal Carcinoma/Ductal Carcinoma Insitu    Current Outpatient Prescriptions  Medication Sig Dispense Refill  . ascorbic acid (VITAMIN C) 1000 MG tablet Take 1,000 mg by mouth daily.      Marland Kitchen aspirin 81 MG tablet Take 81 mg by mouth daily.      Marland Kitchen b complex vitamins capsule Take 1 capsule by mouth daily.      Marland Kitchen BIOTIN 5000 PO Take by mouth daily.       . Calcium Carbonate-Vit D-Min (CALTRATE 600+D PLUS MINERALS PO) Take 1 tablet by mouth daily.      . Cholecalciferol (VITAMIN D3) 10000 UNITS capsule Take 10,000 Units by mouth daily.      . Coenzyme Q10 (CO Q 10 PO) Take by mouth daily.      Marland Kitchen gabapentin (NEURONTIN) 100 MG capsule Take 3 capsules (300 mg total) by mouth 3 (three) times daily.  270 capsule  2  . L-ARGININE PO Take by mouth daily.      Marland Kitchen lidocaine-prilocaine (EMLA) cream Apply topically as needed.  30  g  6  . lisinopril-hydrochlorothiazide (PRINZIDE,ZESTORETIC) 20-12.5 MG per tablet Take 2 tablets by mouth daily.  120 tablet  1  . LORazepam (ATIVAN) 0.5 MG tablet Take 1 tablet (0.5 mg total) by mouth every 6 (six) hours as needed (Nausea or vomiting).  30 tablet  0  . omeprazole (PRILOSEC) 40 MG capsule Take 1 capsule (40 mg total) by mouth daily.  30 capsule  6  . OVER THE COUNTER MEDICATION Take 1 tablet by mouth daily. Trace Minerals      . oxyCODONE (OXY IR/ROXICODONE) 5 MG immediate release tablet Take 1 tablet (5 mg total) by mouth every 4 (four) hours as needed for severe pain.  60 tablet  0  . oxyCODONE (OXY IR/ROXICODONE) 5 MG immediate release tablet Take 1 tablet (5 mg total) by mouth every 6 (six) hours as needed for severe pain.  30 tablet  0  . Probiotic Product (TRUBIOTICS) CAPS Take 1 capsule by mouth daily.      Marland Kitchen VITAMIN E PO Take 1 capsule by mouth daily.        No current facility-administered medications for this encounter.   Facility-Administered Medications Ordered in Other Encounters  Medication Dose Route Frequency Provider Last Rate Last Dose  . 0.9 %  sodium chloride infusion   Intravenous Continuous Minette Headland, NP 1,000 mL/hr at 12/26/13 0915    . prochlorperazine (COMPAZINE) injection 10 mg  10 mg Intravenous Q6H PRN Minette Headland, NP   10 mg at 12/26/13 1694    Allergies  Allergen Reactions  . Adhesive [Tape]   . Nickel Rash    History   Social History  . Marital Status: Married    Spouse Name: N/A    Number of Children: 1  . Years of Education: N/A   Occupational History  .      homemaker   Social History Main Topics  . Smoking status: Former Smoker -- 1.00 packs/day for 15 years    Types: Cigars, Cigarettes    Quit date: 02/23/2004  . Smokeless tobacco: Never Used     Comment: remote smoking history  . Alcohol Use: 0.0 oz/week    0 Shots of liquor per week     Comment: occ   . Drug Use: Yes     Comment: Marjuana  . Sexual  Activity: Yes   Other Topics Concern  . 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      Home Situation: lives with husband and 70 yo son      Spiritual Beliefs: Christian      Lifestyle: elliptical 3-4 times per week; working on diet as well over last year in 2014             Family History  Problem Relation Age of Onset  . Hypertension Mother   . Dementia Mother     small vessel disease  . Cancer Mother 30    cervical  cancer  . Cancer Father 52    prostate ca  . Diabetes Father   . Arthritis Father     PHYSICAL EXAM: Filed Vitals:   03/11/14 1213  BP: 126/82  Pulse: 89  Weight: 189 lb 12.8 oz (86.093 kg)  SpO2: 96%    General:  Well appearing. No respiratory difficulty HEENT: normal Neck: supple. no JVD. Carotids 2+ bilat; no bruits. No lymphadenopathy or thryomegaly appreciated. Cor: PMI nondisplaced. Regular rate & rhythm. No rubs, gallops or murmurs. Lungs: clear Abdomen: soft, nontender, nondistended. No hepatosplenomegaly. No bruits or masses. Good bowel sounds. Extremities: no cyanosis, clubbing, rash, edema Neuro: alert & oriented x 3, cranial nerves grossly intact. moves all 4 extremities w/o difficulty. Affect pleasant.  ASSESSMENT & PLAN: 1. Breast CA, right.  I reviewed today's echo.  EF, lateral S', and global longitudinal strain were all stable.  She may continue Herceptin with repeat echo in 3 months with clinic followup.  2. HTN - well controlled  Glori Bickers MD 03/11/2014

## 2014-03-12 ENCOUNTER — Ambulatory Visit (HOSPITAL_BASED_OUTPATIENT_CLINIC_OR_DEPARTMENT_OTHER): Payer: BC Managed Care – PPO | Admitting: Adult Health

## 2014-03-12 ENCOUNTER — Encounter: Payer: Self-pay | Admitting: Hematology and Oncology

## 2014-03-12 ENCOUNTER — Encounter: Payer: Self-pay | Admitting: Adult Health

## 2014-03-12 ENCOUNTER — Ambulatory Visit (HOSPITAL_BASED_OUTPATIENT_CLINIC_OR_DEPARTMENT_OTHER): Payer: BC Managed Care – PPO

## 2014-03-12 ENCOUNTER — Telehealth: Payer: Self-pay | Admitting: Hematology and Oncology

## 2014-03-12 ENCOUNTER — Other Ambulatory Visit (HOSPITAL_BASED_OUTPATIENT_CLINIC_OR_DEPARTMENT_OTHER): Payer: BC Managed Care – PPO

## 2014-03-12 VITALS — BP 150/86 | HR 95 | Temp 98.7°F | Resp 20 | Ht 64.0 in | Wt 190.4 lb

## 2014-03-12 DIAGNOSIS — C50511 Malignant neoplasm of lower-outer quadrant of right female breast: Secondary | ICD-10-CM

## 2014-03-12 DIAGNOSIS — C50519 Malignant neoplasm of lower-outer quadrant of unspecified female breast: Secondary | ICD-10-CM

## 2014-03-12 DIAGNOSIS — T451X5A Adverse effect of antineoplastic and immunosuppressive drugs, initial encounter: Secondary | ICD-10-CM

## 2014-03-12 DIAGNOSIS — G622 Polyneuropathy due to other toxic agents: Secondary | ICD-10-CM

## 2014-03-12 DIAGNOSIS — G620 Drug-induced polyneuropathy: Secondary | ICD-10-CM | POA: Insufficient documentation

## 2014-03-12 DIAGNOSIS — Z5112 Encounter for antineoplastic immunotherapy: Secondary | ICD-10-CM

## 2014-03-12 LAB — COMPREHENSIVE METABOLIC PANEL (CC13)
ALK PHOS: 63 U/L (ref 40–150)
ALT: 19 U/L (ref 0–55)
ANION GAP: 10 meq/L (ref 3–11)
AST: 20 U/L (ref 5–34)
Albumin: 3.7 g/dL (ref 3.5–5.0)
BILIRUBIN TOTAL: 0.39 mg/dL (ref 0.20–1.20)
BUN: 11.6 mg/dL (ref 7.0–26.0)
CO2: 28 meq/L (ref 22–29)
Calcium: 9.7 mg/dL (ref 8.4–10.4)
Chloride: 104 mEq/L (ref 98–109)
Creatinine: 1.1 mg/dL (ref 0.6–1.1)
Glucose: 155 mg/dl — ABNORMAL HIGH (ref 70–140)
Potassium: 3.6 mEq/L (ref 3.5–5.1)
SODIUM: 142 meq/L (ref 136–145)
TOTAL PROTEIN: 7.8 g/dL (ref 6.4–8.3)

## 2014-03-12 LAB — CBC WITH DIFFERENTIAL/PLATELET
BASO%: 1.3 % (ref 0.0–2.0)
Basophils Absolute: 0.1 10*3/uL (ref 0.0–0.1)
EOS%: 1.2 % (ref 0.0–7.0)
Eosinophils Absolute: 0.1 10*3/uL (ref 0.0–0.5)
HCT: 37.6 % (ref 34.8–46.6)
HGB: 12.7 g/dL (ref 11.6–15.9)
LYMPH%: 34.3 % (ref 14.0–49.7)
MCH: 33.3 pg (ref 25.1–34.0)
MCHC: 33.8 g/dL (ref 31.5–36.0)
MCV: 98.5 fL (ref 79.5–101.0)
MONO#: 0.2 10*3/uL (ref 0.1–0.9)
MONO%: 5.6 % (ref 0.0–14.0)
NEUT#: 2.3 10*3/uL (ref 1.5–6.5)
NEUT%: 57.6 % (ref 38.4–76.8)
PLATELETS: 318 10*3/uL (ref 145–400)
RBC: 3.82 10*6/uL (ref 3.70–5.45)
RDW: 12.4 % (ref 11.2–14.5)
WBC: 4 10*3/uL (ref 3.9–10.3)
lymph#: 1.4 10*3/uL (ref 0.9–3.3)

## 2014-03-12 MED ORDER — SODIUM CHLORIDE 0.9 % IV SOLN
Freq: Once | INTRAVENOUS | Status: AC
  Administered 2014-03-12: 13:00:00 via INTRAVENOUS

## 2014-03-12 MED ORDER — GOSERELIN ACETATE 3.6 MG ~~LOC~~ IMPL
3.6000 mg | DRUG_IMPLANT | Freq: Once | SUBCUTANEOUS | Status: AC
  Administered 2014-03-12: 3.6 mg via SUBCUTANEOUS
  Filled 2014-03-12: qty 3.6

## 2014-03-12 MED ORDER — SODIUM CHLORIDE 0.9 % IJ SOLN
10.0000 mL | INTRAMUSCULAR | Status: DC | PRN
  Administered 2014-03-12: 10 mL
  Filled 2014-03-12: qty 10

## 2014-03-12 MED ORDER — DIPHENHYDRAMINE HCL 25 MG PO CAPS
50.0000 mg | ORAL_CAPSULE | Freq: Once | ORAL | Status: AC
  Administered 2014-03-12: 50 mg via ORAL

## 2014-03-12 MED ORDER — ACETAMINOPHEN 325 MG PO TABS
650.0000 mg | ORAL_TABLET | Freq: Once | ORAL | Status: AC
  Administered 2014-03-12: 650 mg via ORAL

## 2014-03-12 MED ORDER — HEPARIN SOD (PORK) LOCK FLUSH 100 UNIT/ML IV SOLN
500.0000 [IU] | Freq: Once | INTRAVENOUS | Status: AC | PRN
  Administered 2014-03-12: 500 [IU]
  Filled 2014-03-12: qty 5

## 2014-03-12 MED ORDER — TRASTUZUMAB CHEMO INJECTION 440 MG
6.0000 mg/kg | Freq: Once | INTRAVENOUS | Status: AC
  Administered 2014-03-12: 504 mg via INTRAVENOUS
  Filled 2014-03-12: qty 24

## 2014-03-12 MED ORDER — DIPHENHYDRAMINE HCL 25 MG PO CAPS
ORAL_CAPSULE | ORAL | Status: AC
  Filled 2014-03-12: qty 2

## 2014-03-12 MED ORDER — ACETAMINOPHEN 325 MG PO TABS
ORAL_TABLET | ORAL | Status: AC
  Filled 2014-03-12: qty 2

## 2014-03-12 NOTE — Patient Instructions (Signed)

## 2014-03-12 NOTE — Telephone Encounter (Signed)
, °

## 2014-03-12 NOTE — Progress Notes (Addendum)
ID: Shawnie Tina [name] OB: [birth year]  MR#: [id number]  BSW#:[id number]  PCP: Tina Kern., Tina [name] GYN:   SU: Dr. [name] OTHER MD: Dr. Haroldine [name], Dr. Isidore [name] radiation   CHIEF COMPLAINT:Tina Tina [name] 53 y.o. [place], [place] woman with clinical stage T2N0 IIA invasive ductal carcinoma, grade 3, ER positive, HER-2/neu positive invasive ductal carcinoma of the right breast   BREAST CANCER HISTORY:  1. Underwent screening mammogram. On the right side she was noted to have a mass with microcalcifications. Ultrasound in February 2015 showed a 1.2 x 1.1 x 1.0 cm rounded hypoechoic mass with irregular margins in the 8:00 position 4 cm from the nipple. No abnormal axillary lymph nodes were noted. She had MRI of the breasts performed that showed in the lower outer right breast 2.2 x 1.3 x 1.5 cm Irregular mass. In the upper outer left breast there was a 7 x 5 x 8 mm indeterminate enhancing mass. Also another 9 x 7 x 6 mm indeterminant enhancing mass at 9:00 in the left breast was noted.there are also noted to be ominent cortices of right axillary lymph nodes. Patient has undergone biopsy of these. On the right patient had biopsy performed of the 1.2 cm mass. The pathology revealed grade 3 invasive ductal carcinoma with ductal carcinoma in situ. Tumor was ER +70% PR +6% HER-2/neu was amplified and proliferation marker Ki-67 70%.   2. Referred to genetic counseling: testing was negative.   3.  Patient underwent a a right breast lumpectomy and sentinel node biopsy by Dr. [name] on 03/01/2014.  There was 0.6cm, grade III, residual high grade carcinoma with heterologous differentiation.  There was no lymphovascular invasion, and the invasive tumor was 29m away from the inferior margin and 169maway from the posterior margin. 3 sentinel nodes were negative for metastases.    CURRENT THERAPY: Herceptin  INTERVAL HISTORY:  Tina Tina [name] here today for evaluation prior to receiving Herceptin.  She is doing well  today. She did undergo lumpectomy and sentinel node biopsy and tells me that she is healing well.  She did develop a blister and it drained a little bit.  Otherwise she feels like it is doing well.  She has f/u scheduled with Dr. WaDonne Hazeln 03/22/14.  She does continue to have some residual numbness from surgery and an itch that she cannot find.  She is taking Gabapentin for some residual peripheral neuropathy that was related to chemotherapy.  This neuropathy is stable.  She is taking Gabapentin 30062mID.  Otherwise, she denies any problems, questions, or concerns.      REVIEW OF SYSTEMS:  A 10 point review of systems was conducted and is otherwise negative except for what is noted above.     PAST MEDICAL HISTORY: Past Medical History  Diagnosis Date  . Leukocytopenia   . Hypertension   . Degenerative joint disease   . Leukopenia 06/16/2013  . Needs flu shot 06/17/2013  . Cancer of right breast 09/03/13    Invasive Ductal Carcinoma/Ductal Carcinoma Insitu    PAST SURGICAL HISTORY: Past Surgical History  Procedure Laterality Date  . Cesarean section      x3  . Carpal tunnel release  1998    rt  . Trigger finger release  2004    rt thumb  . Portacath placement  08/2013  . Tubal ligation      FAMILY HISTORY Family History  Problem Relation Age of Onset  . Hypertension Mother   . Dementia Mother  small vessel disease  . Cancer Mother 30    cervical  cancer  . Cancer Father 71    prostate ca  . Diabetes Father   . Arthritis Father     GYNECOLOGIC HISTORY: menarche at age 41, G26 P3, no h/o abnormal pap smears, LMP 09/2013 (on Zoladex), has h/o chlamydia at age 34. Used progesterone cream x 1 month.      SOCIAL HISTORY: Lives with husband of 3 years, Tina Tina [name], in a 2 story house.  Drinks ETOH occasionally, has 10 pack year tobacco history, no illicit drug use.      ADVANCED DIRECTIVES: not in place   HEALTH MAINTENANCE: History  Substance Use Topics  .  Smoking status: Former Smoker -- 1.00 packs/day for 15 years    Types: Cigars, Cigarettes    Quit date: 02/23/2004  . Smokeless tobacco: Never Used     Comment: remote smoking history  . Alcohol Use: 0.0 oz/week    0 Shots of liquor per week     Comment: occ     Mammogram: 08/2013 Colonoscopy: not yet Bone Density Scan: n/a Pap Smear:  06/25/2012 Eye Exam: 03/2013 Vitamin D Level:  n/a Lipid Panel: 04/2013   Allergies  Allergen Reactions  . Adhesive [Tape]   . Nickel Rash    Current Outpatient Prescriptions  Medication Sig Dispense Refill  . ascorbic acid (VITAMIN C) 1000 MG tablet Take 1,000 mg by mouth daily.      Marland Kitchen aspirin 81 MG tablet Take 81 mg by mouth daily.      Marland Kitchen b complex vitamins capsule Take 1 capsule by mouth daily.      Marland Kitchen BIOTIN 5000 PO Take by mouth daily.       . Calcium Carbonate-Vit D-Min (CALTRATE 600+D PLUS MINERALS PO) Take 1 tablet by mouth daily.      . Cholecalciferol (VITAMIN D3) 10000 UNITS capsule Take 10,000 Units by mouth daily.      . Coenzyme Q10 (CO Q 10 PO) Take by mouth daily.      Marland Kitchen gabapentin (NEURONTIN) 100 MG capsule Take 3 capsules (300 mg total) by mouth 3 (three) times daily.  270 capsule  2  . L-ARGININE PO Take by mouth daily.      Marland Kitchen lidocaine-prilocaine (EMLA) cream Apply topically as needed.  30 g  6  . lisinopril-hydrochlorothiazide (PRINZIDE,ZESTORETIC) 20-12.5 MG per tablet Take 2 tablets by mouth daily.  120 tablet  1  . LORazepam (ATIVAN) 0.5 MG tablet Take 1 tablet (0.5 mg total) by mouth every 6 (six) hours as needed (Nausea or vomiting).  30 tablet  0  . omeprazole (PRILOSEC) 40 MG capsule Take 1 capsule (40 mg total) by mouth daily.  30 capsule  6  . OVER THE COUNTER MEDICATION Take 1 tablet by mouth daily. Trace Minerals      . oxyCODONE (OXY IR/ROXICODONE) 5 MG immediate release tablet Take 1 tablet (5 mg total) by mouth every 6 (six) hours as needed for severe pain.  30 tablet  0  . Probiotic Product (TRUBIOTICS) CAPS  Take 1 capsule by mouth daily.      Marland Kitchen VITAMIN E PO Take 1 capsule by mouth daily.        No current facility-administered medications for this visit.   Facility-Administered Medications Ordered in Other Visits  Medication Dose Route Frequency Provider Last Rate Last Dose  . 0.9 %  sodium chloride infusion   Intravenous Continuous Minette Headland, NP 1,000 mL/hr at  12/26/13 0915    . prochlorperazine (COMPAZINE) injection 10 mg  10 mg Intravenous Q6H PRN Minette Headland, NP   10 mg at 12/26/13 0917    OBJECTIVE: Filed Vitals:   03/12/14 1031  BP: 150/86  Pulse: 95  Temp: 98.7 F (37.1 C)  Resp: 20     Body mass index is 32.67 kg/(m^2).     GENERAL: Patient is a well appearing female in no acute distress HEENT:  Sclerae anicteric.  Oropharynx clear and moist. No ulcerations or evidence of oropharyngeal candidiasis. Neck is supple.  NODES:  No cervical, supraclavicular, or axillary lymphadenopathy palpated.  BREAST EXAM: right breast s/p lumpectomy with steri strip in place.  Small "blister" type lesion at the lower inner border of the areola, appears to be healing. No drainage or sign of infection present.  Breast is slightly swollen.  LUNGS:  Clear to auscultation bilaterally.  No wheezes or rhonchi. HEART:  Regular rate and rhythm. No murmur appreciated. ABDOMEN:  Soft, nontender.  Positive, normoactive bowel sounds. No organomegaly palpated. MSK:  No focal spinal tenderness to palpation. Full range of motion bilaterally in the upper extremities. EXTREMITIES:  No peripheral edema.   SKIN:  Clear with no obvious rashes or skin changes. No nail dyscrasia. NEURO:  Nonfocal. Well oriented.  Appropriate affect. ECOG FS:1 - Symptomatic but completely ambulatory  LAB RESULTS:  CMP     Component Value Date/Time   NA 141 02/12/2014 0950   NA 138 09/25/2013 1320   K 3.8 02/12/2014 0950   K 3.6* 09/25/2013 1320   CL 98 09/25/2013 1320   CL 104 06/16/2012 1303   CO2 28 02/12/2014 0950    CO2 27 09/25/2013 1320   GLUCOSE 106 02/12/2014 0950   GLUCOSE 82 09/25/2013 1320   GLUCOSE 85 06/16/2012 1303   BUN 16.1 02/12/2014 0950   BUN 9 09/25/2013 1320   CREATININE 1.0 02/12/2014 0950   CREATININE 0.82 09/25/2013 1320   CALCIUM 9.5 02/12/2014 0950   CALCIUM 10.0 09/25/2013 1320   PROT 7.6 02/12/2014 0950   PROT 8.7* 10/04/2012 1830   ALBUMIN 3.9 02/12/2014 0950   ALBUMIN 4.1 10/04/2012 1830   AST 22 02/12/2014 0950   AST 32 10/04/2012 1830   ALT 22 02/12/2014 0950   ALT 27 10/04/2012 1830   ALKPHOS 65 02/12/2014 0950   ALKPHOS 42 10/04/2012 1830   BILITOT 0.72 02/12/2014 0950   BILITOT 0.7 10/04/2012 1830   GFRNONAA 82* 09/25/2013 1320   GFRAA >90 09/25/2013 1320    I No results found for this basename: SPEP,  UPEP,   kappa and lambda light chains    Lab Results  Component Value Date   WBC 4.0 03/12/2014   NEUTROABS 2.3 03/12/2014   HGB 12.7 03/12/2014   HCT 37.6 03/12/2014   MCV 98.5 03/12/2014   PLT 318 03/12/2014      Chemistry      Component Value Date/Time   NA 141 02/12/2014 0950   NA 138 09/25/2013 1320   K 3.8 02/12/2014 0950   K 3.6* 09/25/2013 1320   CL 98 09/25/2013 1320   CL 104 06/16/2012 1303   CO2 28 02/12/2014 0950   CO2 27 09/25/2013 1320   BUN 16.1 02/12/2014 0950   BUN 9 09/25/2013 1320   CREATININE 1.0 02/12/2014 0950   CREATININE 0.82 09/25/2013 1320      Component Value Date/Time   CALCIUM 9.5 02/12/2014 0950   CALCIUM 10.0 09/25/2013 1320   ALKPHOS  65 02/12/2014 0950   ALKPHOS 42 10/04/2012 1830   AST 22 02/12/2014 0950   AST 32 10/04/2012 1830   ALT 22 02/12/2014 0950   ALT 27 10/04/2012 1830   BILITOT 0.72 02/12/2014 0950   BILITOT 0.7 10/04/2012 1830       No results found for this basename: LABCA2    No components found with this basename: LABCA125    No results found for this basename: INR,  in the last 168 hours  Urinalysis    Component Value Date/Time   LABSPEC 1.005 10/09/2013 1646   GLUCOSEU Negative 10/09/2013 1646   UROBILINOGEN 0.2 10/09/2013 1646     PATHOLOGY: 1. Breast, lumpectomy, right breast mass with radioactive seed - INVASIVE HIGH GRADE CARCINOSARCOMA (INVASIVE MAMMARY CARCINOMA WITH MALIGNANT HETEROLOGOUS DIFFERENTIATION). SEE COMMENT. - INVASIVE MAMMARY CARCINOMA, SEE COMMENT. - NEGATIVE FOR LYMPH VASCULAR INVASION. - INVASIVE TUMOR IS 7 MM FROM THE NEAREST MARGIN (INFERIOR) - DUCTAL CARCINOMA IN SITU - IN SITU CARCINOMA IS 1 MM FROM NEAREST MARGIN (POSTERIOR). - PREVIOUS BIOPSY SITE. - SEE TUMOR SYNOPTIC TEMPLATE BELOW. 2. Lymph node, sentinel, biopsy, right axillary #1 - ONE LYMPH NODE, NEGATIVE FOR TUMOR (0/1). 1 of 4 FINAL for JENNIFR, GAETA ([id number]) Diagnosis(continued) 3. Lymph node, sentinel, biopsy, right axillary # 2 - ONE LYMPH NODE, NEGATIVE FOR TUMOR (0/1). 4. Lymph node, sentinel, biopsy, right axillary #3 - ONE LYMPH NODE, NEGATIVE FOR TUMOR (0/1). Microscopic Comment 1. BREAST, INVASIVE TUMOR, WITH LYMPH NODE SAMPLING Specimen, including laterality and lymph node sampling (sentinel, non-sentinel): Right breast with sentinel lymph node sampling. Procedure: Lumpectomy. Histologic type: High grade carcinoma with heterologous differentiation, see comment. Grade: III of III Tubule formation: 3 Nuclear pleomorphism: 3 Mitotic:3 Tumor size (gross measurement): 0.6 cm Margins: Invasive, distance to closest margin: 7 mm In-situ, distance to closest margin: 1 mm If margin positive, focally or broadly: N/A Lymphovascular invasion: Absent. Ductal carcinoma in situ: Present, pending stains Grade: II of III Extensive intraductal component: Absent. Lobular neoplasia: Absent. Tumor focality: Unifocal Treatment effect: Present. If present, treatment effect in breast tissue, lymph nodes or both: Breast tissue Extent of tumor: Skin: N/A Nipple: N/A Skeletal muscle: N/A Lymph nodes: Examined: 3 Sentinel 0 Non-sentinel 3 Total Lymph nodes with metastasis: 0 Isolated tumor cells (< 0.2 mm):  N/A Micrometastasis: (> 0.2 mm and < 2.0 mm): N/A Macrometastasis: (> 2.0 mm): N/A Extracapsular extension: N/A Breast prognostic profile: Estrogen receptor: Not repeated, previous study demonstrated 7% positivity ([id number]) Progesterone receptor: Not repeated, previous study demonstrated 6% positivity ([id number]) Her 2 neu: Repeated, previous study demonstrated amplification (3.55) ([id number]) Ki-67: Not repeated, previous study demonstrated 70% proliferation rate Non-neoplastic breast: Previous biopsy site, fibrocystic change, columnar cell/hyperplasia without atypia and microcalcifications. TNM: ypT1b, pN0, pMX Comments: The residual focus of high grade mammary carcinoma (immediately adjacent to the previous biopsy site) demonstrates malignant cartilaginous heterologous differentiation. The heterologous differentiation is confirmed with cytokeratin AE1/3 and S100 immunostains. (CRR:gt, 03/02/14) 2. , 3, and 4. These parts were reviewed with Dr. Gari Crown who concurs. 2 of 4 FINAL for Tina, Tina [name] ([id number]) Tina Tina [name] Pathologist, Electronic Signature (Case signed 03/03/2014)  ASSESSMENT: 53 y.o. [place], [place] woman with clinical stage right breast T2N0 IIA invasive ductal carcinoma, grade 3, ER 7% PR 6%, Ki-67 70%, HER-2/neu positive.   1.  Patient was started on neoadjuvant Docetaxel, Carboplatin, Trastuzumab, Pertuzumab that started on 10/02/13. She received treatment on day 1 of a 21 day cycle with Neulasta given on day 2 for granulocyte support. The  patient received 4 cycles and due to fluid retention and neuropathy the Docetaxel was discontinued.  Gemcitabine was given at 868m/m2 instead, with no other changes made to her chemotherapy regimen.  She completed her final 2 cycles with that chemotherapy regimen.  2. The patient does receive Zoladex every 28 days.    3.  Patient underwent a a right breast lumpectomy and sentinel node biopsy by Dr. WDonne Hazelon 03/01/2014.  There was  0.6cm, grade III, residual high grade carcinoma with heterologous differentiation.  There was no lymphovascular invasion, and the invasive tumor was 732maway from the inferior margin and 40m38mway from the posterior margin. 3 sentinel nodes were negative for metastases.    4. Adjuvant Trastuzumab every 21 days to complete out one calendar year of anti-HER-2 therapy.  5.  Radiation therapy to follow surgery.    6. Anti-estrogen therapy to follow radiation.    PLAN:   FreAnaleya doing well today.  She underwent her surgery with Dr. WakDonne Hazeld had clear margins.  Her breast appears to be healing without any difficulty today. I referred her to Dr. SquIsidore Moosr evaluation for radiation.  Her labs are stable today.  She will proceed with adjuvant Trastuzumab therapy.    She does have residual peripheral neuropathy related to her chemotherapy treatment.  She is taking Gabapentin 300m65mD and tolerating it well.  Her neuropathy is controlled with this and she is able to perform ADLs.    Cardiac: Her last echocardiogram on 03/11/14 demonstrated a LVEF of 60-65%. She was evaluated by Dr. BeniVaughan Browner8/13/15 in the cardio-onc clinic. He reviewed her echo, and cleared her to continue with Herceptin.  Follow up was recommended in 3 months time.   FredClarivell return in 3 weeks for labs and Trastuzumab, and in 6 weeks for labs, an appointment with Dr. GudeLindi Adied Trastuzumab therapy.  She will f/u with Dr. WakeDonne Hazel8/24/15.   FredFidenciaws to call us [facility] interim for any questions or concerns.  We can certainly see her sooner if needed.  I spent 40 minutes counseling the patient face to face.  The total time spent in the appointment was 50 minutes.  LindMinette Headland Medical Oncology [facility]-[id number]/2015 10:51 AM  Attending Note  I personally saw and examined FredShawnie Ponse plan of care was discussed with her. I agree with the assessment and plan as documented  above. 1. T2N0 IIA invasive ductal carcinoma, grade 3, ER 7% PR 6%, Ki-67 70%, HER-2/neu positive. Status post surgery. We'll resume adjuvant Herceptin. 2. patient will need radiation therapy. 3. adjuvant antiestrogen therapy we'll start after radiation is complete. 4. neuropathy related to Taxotere: Continue with Neurontin Signed GudeRulon Eisenmenger

## 2014-03-13 ENCOUNTER — Emergency Department (HOSPITAL_COMMUNITY)
Admission: EM | Admit: 2014-03-13 | Discharge: 2014-03-13 | Disposition: A | Discharge status: Home / Self Care | Payer: BC Managed Care – PPO | Attending: Emergency Medicine | Admitting: Emergency Medicine

## 2014-03-13 ENCOUNTER — Emergency Department (HOSPITAL_COMMUNITY): Payer: BC Managed Care – PPO

## 2014-03-13 ENCOUNTER — Encounter (HOSPITAL_COMMUNITY): Payer: Self-pay | Admitting: Emergency Medicine

## 2014-03-13 DIAGNOSIS — M1990 Unspecified osteoarthritis, unspecified site: Secondary | ICD-10-CM | POA: Insufficient documentation

## 2014-03-13 DIAGNOSIS — Z7982 Long term (current) use of aspirin: Secondary | ICD-10-CM | POA: Diagnosis not present

## 2014-03-13 DIAGNOSIS — I10 Essential (primary) hypertension: Secondary | ICD-10-CM | POA: Diagnosis not present

## 2014-03-13 DIAGNOSIS — Z79899 Other long term (current) drug therapy: Secondary | ICD-10-CM | POA: Diagnosis not present

## 2014-03-13 DIAGNOSIS — Z853 Personal history of malignant neoplasm of breast: Secondary | ICD-10-CM | POA: Diagnosis not present

## 2014-03-13 DIAGNOSIS — Z862 Personal history of diseases of the blood and blood-forming organs and certain disorders involving the immune mechanism: Secondary | ICD-10-CM | POA: Diagnosis not present

## 2014-03-13 DIAGNOSIS — Z87891 Personal history of nicotine dependence: Secondary | ICD-10-CM | POA: Diagnosis not present

## 2014-03-13 DIAGNOSIS — M25551 Pain in right hip: Secondary | ICD-10-CM

## 2014-03-13 DIAGNOSIS — M25559 Pain in unspecified hip: Secondary | ICD-10-CM | POA: Insufficient documentation

## 2014-03-13 MED ORDER — OXYCODONE-ACETAMINOPHEN 5-325 MG PO TABS
2.0000 | ORAL_TABLET | Freq: Once | ORAL | Status: AC
  Administered 2014-03-13: 2 via ORAL
  Filled 2014-03-13: qty 2

## 2014-03-13 MED ORDER — IBUPROFEN 800 MG PO TABS
800.0000 mg | ORAL_TABLET | Freq: Once | ORAL | Status: AC
  Administered 2014-03-13: 800 mg via ORAL
  Filled 2014-03-13: qty 1

## 2014-03-13 MED ORDER — IBUPROFEN 800 MG PO TABS: 800.0000 mg | ORAL_TABLET | Freq: Three times a day (TID) | ORAL | Status: DC | PRN

## 2014-03-13 MED ORDER — ONDANSETRON 4 MG PO TBDP
4.0000 mg | ORAL_TABLET | Freq: Once | ORAL | Status: AC
  Administered 2014-03-13: 4 mg via ORAL
  Filled 2014-03-13 (×2): qty 1

## 2014-03-13 MED ORDER — OXYCODONE-ACETAMINOPHEN 5-325 MG PO TABS: 1.0000 | ORAL_TABLET | ORAL | Status: DC | PRN

## 2014-03-13 NOTE — ED Provider Notes (Signed)
TIME SEEN: 11:43 AM  CHIEF COMPLAINT: Right hip pain  HPI: Patient is a 53 y.o. F with history of breast cancer who is currently undergoing treatment who woke up this morning with right hip pain is worse with movement and palpation. No history of injury or overexertion. She does have neuropathy in bilateral feet but denies any new numbness or tingling. No focal weakness. She does have chronic back pain. No new back pain. No bowel or bladder incontinence. No fever.  ROS: See HPI Constitutional: no fever  Eyes: no drainage  ENT: no runny nose   Cardiovascular:  no chest pain  Resp: no SOB  GI: no vomiting GU: no dysuria Integumentary: no rash  Allergy: no hives  Musculoskeletal: no leg swelling  Neurological: no slurred speech ROS otherwise negative  PAST MEDICAL HISTORY/PAST SURGICAL HISTORY:  Past Medical History  Diagnosis Date  . Leukocytopenia   . Hypertension   . Degenerative joint disease   . Leukopenia 06/16/2013  . Needs flu shot 06/17/2013  . Cancer of right breast 09/03/13    Invasive Ductal Carcinoma/Ductal Carcinoma Insitu    MEDICATIONS:  Prior to Admission medications   Medication Sig Start Date End Date Taking? Authorizing Provider  ascorbic acid (VITAMIN C) 1000 MG tablet Take 1,000 mg by mouth daily.   Yes Historical Provider, MD  aspirin 81 MG tablet Take 81 mg by mouth daily.   Yes Historical Provider, MD  b complex vitamins capsule Take 1 capsule by mouth daily.   Yes Historical Provider, MD  BIOTIN 5000 PO Take by mouth daily.    Yes Historical Provider, MD  Calcium Carbonate-Vit D-Min (CALTRATE 600+D PLUS MINERALS PO) Take 1 tablet by mouth daily.   Yes Historical Provider, MD  Cholecalciferol (VITAMIN D3) 10000 UNITS capsule Take 10,000 Units by mouth daily.   Yes Historical Provider, MD  Coenzyme Q10 (CO Q 10 PO) Take by mouth daily.   Yes Historical Provider, MD  gabapentin (NEURONTIN) 100 MG capsule Take 3 capsules (300 mg total) by mouth 3 (three)  times daily. 12/11/13  Yes Minette Headland, NP  L-ARGININE PO Take by mouth daily.   Yes Historical Provider, MD  lidocaine-prilocaine (EMLA) cream Apply topically as needed. 09/20/13  Yes [name], MD  lisinopril-hydrochlorothiazide (PRINZIDE,ZESTORETIC) 20-12.5 MG per tablet Take 2 tablets by mouth daily. 12/15/13  Yes [name], DO  LORazepam (ATIVAN) 0.5 MG tablet Take 1 tablet (0.5 mg total) by mouth every 6 (six) hours as needed (Nausea or vomiting). 01/01/14  Yes Minette Headland, NP  omeprazole (PRILOSEC) 40 MG capsule Take 1 capsule (40 mg total) by mouth daily. 10/23/13  Yes Minette Headland, NP  OVER THE COUNTER MEDICATION Take 1 tablet by mouth daily. Trace Minerals   Yes Historical Provider, MD  oxyCODONE (OXY IR/ROXICODONE) 5 MG immediate release tablet Take 1 tablet (5 mg total) by mouth every 6 (six) hours as needed for severe pain. 03/01/14  Yes Rolm Bookbinder, MD  Probiotic Product (TRUBIOTICS) CAPS Take 1 capsule by mouth daily.   Yes Historical Provider, MD  VITAMIN E PO Take 1 capsule by mouth daily.    Yes Historical Provider, MD    ALLERGIES:  Allergies  Allergen Reactions  . Adhesive [Tape] Itching    Burned and scarred skinned  . Nickel Rash    SOCIAL HISTORY:  History  Substance Use Topics  . Smoking status: Former Smoker -- 1.00 packs/day for 15 years    Types: Cigars, Cigarettes  Quit date: 02/23/2004  . Smokeless tobacco: Never Used     Comment: remote smoking history  . Alcohol Use: 0.0 oz/week    0 Shots of liquor per week     Comment: occ     FAMILY HISTORY: Family History  Problem Relation Age of Onset  . Hypertension Mother   . Dementia Mother     small vessel disease  . Cancer Mother 30    cervical  cancer  . Cancer Father 53    prostate ca  . Diabetes Father   . Arthritis Father     EXAM: BP 146/98  Pulse 96  Temp(Src) 99.3 F (37.4 C) (Oral)  Resp 16  SpO2 98% CONSTITUTIONAL: Alert and oriented and responds  appropriately to questions. Well-appearing; well-nourished HEAD: Normocephalic EYES: Conjunctivae clear, PERRL ENT: normal nose; no rhinorrhea; moist mucous membranes; pharynx without lesions noted NECK: Supple, no meningismus, no LAD  CARD: RRR; S1 and S2 appreciated; no murmurs, no clicks, no rubs, no gallops RESP: Normal chest excursion without splinting or tachypnea; breath sounds clear and equal bilaterally; no wheezes, no rhonchi, no rales,  ABD/GI: Normal bowel sounds; non-distended; soft, non-tender, no rebound, no guarding, no midline spinal tenderness or step-off or deformity BACK:  The back appears normal and is non-tender to palpation, there is no CVA tenderness EXT: Patient is tender to palpation diffusely over the right hip without swelling or erythema or warmth, tenderness palpation diffusely over the ITB and, pain with resisted adduction but not abduction, no likely discrepancy, 2+ DP pulses bilaterally, sensation to light touch intact diffusely other than her feet which is chronic, Normal ROM in all joints; otherwise extremities are non-tender to palpation; no edema; normal capillary refill; no cyanosis    SKIN: Normal color for age and race; warm NEURO: Moves all extremities equally PSYCH: The patient's mood and manner are appropriate. Grooming and personal hygiene are appropriate.  MEDICAL DECISION MAKING: Patient here with right hip pain. Suspect strain versus possible bursitis versus IT band tendinitis. No new back pain or neurologic deficits on exam. No tenderness over the puriform is to suggest sciatica. No signs to suggest a septic arthritis. We'll obtain x-rays of the right hip and give oral pain medication.  ED PROGRESS: Patient's pain is controlled. X-ray is negative. Offered crutches for support the patient declines. Have discussed importance of rest, elevation, ice, anti-inflammatories, stretching. Discussed return precautions including fever, uncontrolled pain, new  neurologic deficits, swelling or warmth or redness of the joint. Patient verbalizes understanding is comfortable plan. We'll discharge with prescription for ibuprofen and Percocet.     [place], DO 03/13/14 1257

## 2014-03-13 NOTE — ED Notes (Signed)
She states she awoke with non-traumatic right hip pain today.  He has recently undergone chemo. Treatments and is on two anti cancer injections.  She is in no distress.  She c/o chronic paresthesias and dysthesias of bilat. Feet and all toes.

## 2014-03-13 NOTE — Discharge Instructions (Signed)
Hip Pain Your hip is the joint between your upper legs and your lower pelvis. The bones, cartilage, tendons, and muscles of your hip joint perform a lot of work each day supporting your body weight and allowing you to move around. Hip pain can range from a minor ache to severe pain in one or both of your hips. Pain may be felt on the inside of the hip joint near the groin, or the outside near the buttocks and upper thigh. You may have swelling or stiffness as well.  HOME CARE INSTRUCTIONS   Take medicines only as directed by your health care provider.  Apply ice to the injured area:  Put ice in a plastic bag.  Place a towel between your skin and the bag.  Leave the ice on for 15-20 minutes at a time, 3-4 times a day.  Keep your leg raised (elevated) when possible to lessen swelling.  Avoid activities that cause pain.  Follow specific exercises as directed by your health care provider.  Sleep with a pillow between your legs on your most comfortable side.  Record how often you have hip pain, the location of the pain, and what it feels like. SEEK MEDICAL CARE IF:   You are unable to put weight on your leg.  Your hip is red or swollen or very tender to touch.  Your pain or swelling continues or worsens after 1 week.  You have increasing difficulty walking.  You have a fever. SEEK IMMEDIATE MEDICAL CARE IF:   You have fallen.  You have a sudden increase in pain and swelling in your hip. MAKE SURE YOU:   Understand these instructions.  Will watch your condition.  Will get help right away if you are not doing well or get worse. Document Released: 01/03/2010 Document Revised: 11/30/2013 Document Reviewed: 03/12/2013 [facility] Patient Information 2015 [place], [place]. This information is not intended to replace advice given to you by your health care provider. Make sure you discuss any questions you have with your health care provider.   Iliotibial Band Syndrome with  Rehab The iliotibial (IT) band is a tendon that connects the hip muscles to the shinbone (tibia) and to one of the bones of the pelvis (ileum). The IT band passes by the knee and is often irritated by the outer portion of the knee (lateral femoral condyle). A fluid filled sac (bursa) exists between the tendon and the bone, to cushion and reduce friction. Overuse of the tendon may cause excessive friction, which results in IT band syndrome. This condition involves inflammation of the bursa (bursitis) and/or inflammation of the IT band (tendinitis). SYMPTOMS   Pain, tenderness, swelling, warmth, or redness over the IT band, at the outer knee (above the joint).  Pain that travels up or down the thigh or leg.  Initially, pain at the beginning of an exercise, that decreases once warmed up. Eventually, pain throughout the activity, getting worse as the activity continues. May cause the athlete to stop in the middle of training or competing.  Pain that gets worse when running down hills or stairs, on banked tracks, or next to the curb on the street.  Pain that increases when the foot of the affected leg hits the ground.  Possibly, a crackling sound (crepitation) when the tendon or bursa is moved or touched. CAUSES  IT band syndrome is caused by irritation of the IT band and the underlying bursa. This eventually results in inflammation and pain. IT band syndrome is an overuse  injury.  RISK INCREASES WITH:  Sports with repetitive knee-bending activities (distance running, cycling).  Incorrect training techniques, including sudden changes in the intensity, frequency, or duration of training.  Not enough rest between workouts.  Poor strength and flexibility, especially a tight IT band.  Failure to warm up properly before activity.  Bow legs.  Arthritis of the knee. PREVENTION   Warm up and stretch properly before activity.  Allow for adequate recovery between workouts.  Maintain physical  fitness:  Strength, flexibility, and endurance.  Cardiovascular fitness.  Learn and use proper training technique, including reducing running mileage, shortening stride, and avoiding running on hills and banked surfaces.  Wear arch supports (orthotics), if you have flat feet. PROGNOSIS  If treated properly, IT band syndrome usually goes away within 6 weeks of treatment. RELATED COMPLICATIONS   Longer healing time, if not properly treated, or if not given enough time to heal.  Recurring inflammation of the tendon and bursa, that may result in a chronic condition.  Recurring symptoms, if activity is resumed too soon, with overuse, with a direct blow, or with poor training technique.  Inability to complete training or competition. TREATMENT  Treatment first involves the use of ice and medicine, to reduce pain and inflammation. The use of strengthening and stretching exercises may help reduce pain with activity. These exercises may be performed at home or with a therapist. For individuals with flat feet, an arch support (orthotic) may be helpful. Some individuals find that wearing a knee sleeve or compression bandage around the knee during workouts provides some relief. Certain training techniques, such as adjusting stride length, avoiding running on hills or stairs, changing the direction you run on a circular or banked track, or changing the side of the road you run on, if you run next to the curb, may help decrease symptoms of IT band syndrome. Cyclists may need to change the seat height or foot position on their bicycles. An injection of cortisone into the bursa may be recommended. Surgery to remove the inflamed bursa and/or part of the IT band is only considered after at least 6 months of non-surgical treatment.  MEDICATION   If pain medicine is needed, nonsteroidal anti-inflammatory medicines (aspirin and ibuprofen), or other minor pain relievers (acetaminophen), are often advised.  Do  not take pain medicine for 7 days before surgery.  Prescription pain relievers may be given, if your caregiver thinks they are needed. Use only as directed and only as much as you need.  Corticosteroid injections may be given by your caregiver. These injections should be reserved for the most serious cases, because they may only be given a certain number of times. HEAT AND COLD  Cold treatment (icing) should be applied for 10 to 15 minutes every 2 to 3 hours for inflammation and pain, and immediately after activity that aggravates your symptoms. Use ice packs or an ice massage.  Heat treatment may be used before performing stretching and strengthening activities prescribed by your caregiver, physical therapist, or athletic trainer. Use a heat pack or a warm water soak. SEEK MEDICAL CARE IF:   Symptoms get worse or do not improve in 2 to 4 weeks, despite treatment.  New, unexplained symptoms develop. (Drugs used in treatment may produce side effects.) EXERCISES  RANGE OF MOTION (ROM) AND STRETCHING EXERCISES - Iliotibial Band Syndrome These exercises may help you when beginning to rehabilitate your injury. Your symptoms may go away with or without further involvement from your physician, physical therapist or  Product/process development scientist. While completing these exercises, remember:   Restoring tissue flexibility helps normal motion to return to the joints. This allows healthier, less painful movement and activity.  An effective stretch should be held for at least 30 seconds.  A stretch should never be painful. You should only feel a gentle lengthening or release in the stretched tissue. STRETCH - Quadriceps, Prone   Lie on your stomach on a firm surface, such as a bed or padded floor.  Bend your right / left knee and grasp your ankle. If you are unable to reach your ankle or pant leg, use a belt around your foot to lengthen your reach.  Gently pull your heel toward your buttocks. Your knee should  not slide out to the side. You should feel a stretch in the front of your thigh and knee.  Hold this position for __________ seconds. Repeat __________ times. Complete this stretch __________ times per day.  STRETCH - Iliotibial Band  On the floor or bed, lie on your side, so your right / left leg is on top. Bend your knee and grab your ankle.  Slowly bring your knee back so that your thigh is in line with your trunk. Keep your heel at your buttocks and gently arch your back, so your head, shoulders and hips line up.  Slowly lower your leg so that your knee approaches the floor or bed, until you feel a gentle stretch on the outside of your right / left thigh. If you do not feel a stretch and your knee will not fall farther, place the heel of your opposite foot on top of your knee, and pull your thigh down farther.  Hold this stretch for __________ seconds. Repeat __________ times. Complete this stretch __________ times per day. STRENGTHENING EXERCISES - Iliotibial Band Syndrome Improving the flexibility of the IT band will best relieve your discomfort due to IT band syndrome. Strengthening exercises, however, can help improve both muscle endurance and joint mechanics, reducing the factors that can contribute to this condition. Your physician, physical therapist or athletic trainer may provide you with exercises that train specific muscle groups that are especially weak. The following exercises target muscles that are often weak in people who have IT band syndrome. STRENGTH - Hip Abductors, Straight Leg Raises  Be aware of your form throughout the entire exercise, so that you exercise the correct muscles. Poor form means that you are not strengthening the correct muscles.  Lie on your side, so that your head, shoulders, knee and hip line up. You may bend your lower knee to help maintain your balance. Your right / left leg should be on top.  Roll your hips slightly forward, so that your hips are  stacked directly over each other and your right / left knee is facing forward.  Lift your top leg up 4-6 inches, leading with your heel. Be sure that your foot does not drift forward and that your knee does not roll toward the ceiling.  Hold this position for __________ seconds. You should feel the muscles in your outer hip lifting (you may not notice this until your leg begins to tire).  Slowly lower your leg to the starting position. Allow the muscles to fully relax before beginning the next repetition. Repeat __________ times. Complete this exercise __________ times per day.  STRENGTH - Quad/VMO, Isometric  Sit in a chair with your right / left knee slightly bent. With your fingertips, feel the VMO muscle (just above the inside  of your knee). The VMO is important in controlling the position of your kneecap.  Keeping your fingertips on this muscle. Without actually moving your leg, attempt to drive your knee down, as if straightening your leg. You should feel your VMO tense. If you have a difficult time, you may wish to try the same exercise on your healthy knee first.  Tense this muscle as hard as you can, without increasing any knee pain.  Hold for __________ seconds. Relax the muscles slowly and completely between each repetition. Repeat __________ times. Complete this exercise __________ times per day.  Document Released: 07/16/2005 Document Revised: 10/08/2011 Document Reviewed: 10/28/2008 [facility] Patient Information 2015 [place], [place]. This information is not intended to replace advice given to you by your health care provider. Make sure you discuss any questions you have with your health care provider.

## 2014-03-13 NOTE — ED Notes (Signed)
Pt from home woke up with R hip pain w/o injury. Pt is being tx for breast CA with last chemo tx being in June. Pt states that she had a Zolodex injection yesterday. Pt has not started her radiation tx yet. Pt breast surgery was August 3rd. Pt is able to move R hip and R leg, but with intense pain. Pt rates 10/10 pain. Pts lungs clear bilaterally and in NAD. Pt is A&O and family at bedside. Pt adds that she has mild nausea.

## 2014-03-15 ENCOUNTER — Encounter: Payer: Self-pay | Admitting: Adult Health

## 2014-03-15 ENCOUNTER — Telehealth: Payer: Self-pay

## 2014-03-15 NOTE — Telephone Encounter (Signed)
Call A Nurse Triage Call Report received.  Per Call A Nurse instructions - pt wend to ED.  Original to scan.  Routed to Dr. Lindi Adie

## 2014-03-22 ENCOUNTER — Ambulatory Visit (INDEPENDENT_AMBULATORY_CARE_PROVIDER_SITE_OTHER): Payer: BC Managed Care – PPO | Admitting: General Surgery

## 2014-03-22 ENCOUNTER — Encounter (INDEPENDENT_AMBULATORY_CARE_PROVIDER_SITE_OTHER): Payer: Self-pay | Admitting: General Surgery

## 2014-03-22 ENCOUNTER — Other Ambulatory Visit (INDEPENDENT_AMBULATORY_CARE_PROVIDER_SITE_OTHER): Payer: Self-pay | Admitting: *Deleted

## 2014-03-22 VITALS — BP 140/102 | HR 97 | Temp 98.5°F | Ht 64.0 in | Wt 189.0 lb

## 2014-03-22 DIAGNOSIS — Z09 Encounter for follow-up examination after completed treatment for conditions other than malignant neoplasm: Secondary | ICD-10-CM

## 2014-03-22 DIAGNOSIS — C50911 Malignant neoplasm of unspecified site of right female breast: Secondary | ICD-10-CM

## 2014-03-22 NOTE — Progress Notes (Signed)
Subjective:     Patient ID: Tina [name], female   DOB: Mar 22, 1961, 53 y.o.   MRN: [id number]  HPI This is a 53 year old female who underwent primary chemotherapy. This was followed by surgery earlier this month. Her lymph nodes on her sentinel lymph node biopsy were negative. She ends up having a invasive carcinoma with chondroid features. The margins of this are all clear. One of the DCIS margins close but I don't think there is anything to remove in this position. She's doing well except for some numbness and some swelling in her breast. I told her both would get better over some time. She is due to see radiation oncology on Wednesday. She had been having some difficulty moving her arm right now.  Review of Systems     Objective:   Physical Exam Right breast incision without infection, some moderate swelling Right axillary incision clean without infection    Assessment:     S/p lump/sn     Plan:     I'm going to refer her to see physical therapy so she can get her arm moving before she begins radiation. I told her that most of her symptoms we have better with some time. We discussed where a sports bra and applying ice. Ill see her back in 8 months or sooner if she needs to. She will begin radiation whenever ready.

## 2014-03-23 ENCOUNTER — Ambulatory Visit (INDEPENDENT_AMBULATORY_CARE_PROVIDER_SITE_OTHER): Payer: BC Managed Care – PPO | Admitting: General Surgery

## 2014-03-23 ENCOUNTER — Encounter (INDEPENDENT_AMBULATORY_CARE_PROVIDER_SITE_OTHER): Payer: Self-pay | Admitting: General Surgery

## 2014-03-23 VITALS — BP 124/76 | HR 73 | Temp 97.8°F | Ht 64.0 in | Wt 187.0 lb

## 2014-03-23 DIAGNOSIS — Z09 Encounter for follow-up examination after completed treatment for conditions other than malignant neoplasm: Secondary | ICD-10-CM

## 2014-03-23 MED ORDER — DOXYCYCLINE HYCLATE 100 MG PO TABS: 100.0000 mg | ORAL_TABLET | Freq: Two times a day (BID) | ORAL | Status: DC

## 2014-03-23 NOTE — Patient Instructions (Signed)
Take antibiotic as prescribed Call if symptoms worsen Expect some blood tinged drainage. Change bandage as needed Follow up iwht Dr [name]

## 2014-03-23 NOTE — Progress Notes (Signed)
Subjective:     Patient ID: Tina [name], female   DOB: 1961/06/07, 53 y.o.   MRN: [id number]  HPI 54 year old African American female comes in because of right breast swelling, redness and tenderness. She states it became more tender last night. She just saw Dr. [name] yesterday in the office. She denies any fevers or chills.  Review of Systems     Objective:   Physical Exam  Pulmonary/Chest:    Healed incision R NAC and R axillary; large amount of swelling upper outer quadrant with mild skin redness, warm, some TTP  BP 124/76  Pulse 73  Temp(Src) 97.8 F (36.6 C)  Ht 5\' 4"  (1.626 m)  Wt 187 lb (84.823 kg)  BMI 32.08 kg/m2 CTA reg     Assessment:     Status post right breast lumpectomy with sentinel lymph node biopsy Minor skin inflammation     Plan:     There is certainly some erythema to the upper outer quadrant. On exam it felt like there may be a seroma or hematoma in the area. We discussed possible aspiration the pros and cons of that. After obtaining verbal consent the area was infiltrated with some local after skin prep. There is no significant fluid collection. A dry gauze is applied. Because of the erythema I did place her on doxycycline for 7 days. She can followup in one to 2 weeks with Dr. [name] for a wound checkup  Leighton Ruff. Redmond Pulling, MD, FACS General, Bariatric, & Minimally Invasive Surgery [facility]  Note: This dictation was prepared with Dragon/digital dictation along with [facility] technology. Any transcriptional errors that result from this process are unintentional.

## 2014-03-23 NOTE — Progress Notes (Signed)
Location of Breast Cancer: Right Breast, Upper Outer Quadrant  Histology per Pathology Report:  03/01/14  Diagnosis 1. Breast, lumpectomy, right breast mass with radioactive seed - INVASIVE HIGH GRADE CARCINOSARCOMA (INVASIVE MAMMARY CARCINOMA WITH MALIGNANT HETEROLOGOUS DIFFERENTIATION). SEE COMMENT. - INVASIVE MAMMARY CARCINOMA, SEE COMMENT. - NEGATIVE FOR LYMPH VASCULAR INVASION. - INVASIVE TUMOR IS 7 MM FROM THE NEAREST MARGIN (INFERIOR) - DUCTAL CARCINOMA IN SITU - IN SITU CARCINOMA IS 1 MM FROM NEAREST MARGIN (POSTERIOR). - PREVIOUS BIOPSY SITE. - SEE TUMOR SYNOPTIC TEMPLATE BELOW. 2. Lymph node, sentinel, biopsy, right axillary #1 - ONE LYMPH NODE, NEGATIVE FOR TUMOR (0/1) 3. Lymph node, sentinel, biopsy, right axillary # 2 - ONE LYMPH NODE, NEGATIVE FOR TUMOR (0/1). 4. Lymph node, sentinel, biopsy, right axillary #3 - ONE LYMPH NODE, NEGATIVE FOR TUMOR (0/1).  09/03/13 Diagnosis  Breast, right, needle core biopsy, 8 o'clock  - INVASIVE DUCTAL CARCINOMA.  - DUCTAL CARCINOMA IN SITU.  Receptor Status: ER(7%), PR (6%), Her2-neu (Amplification - HER2/NEU BY CISH - SHOWS AMPLIFICATION BY CISH ANALYSIS. RESULT  RATIO OF HER2: CEP 17 SIGNALS 2.22 AVERAGE HER2 COPY NUMBER PER CELL 3.55 REFERENCE RANGE, Ki-67 (70%)  Found on screening mammography?: Routine screening mammogram that showed a possible right breast mass. Magnification views show an oval, poorly defined mass in the lower outer quadrant of the right breast that contains small, poorly defined microcalcifications. U/S shows a 1.2x1.1x1 cm round hypoechoic mass with mildly irregular margins in the 8 oclock position of the right breast 4 cm from nipple. There are no abnormal appearing right axillary lymph nodes  Past/Anticipated interventions by surgeon, if EMV:VKPQAE Core Biopsy of the Right Breast   Past/Anticipated interventions by medical oncology, if any: Chemotherapy :Neoadjuvant Docetaxel, Carboplatin, Trastuzumab,  Pertuzumab that started on 10/02/13. She received treatment on day 1 of a 21 day cycle with Neulasta given on day 2 for granulocyte support. The patient received 4 cycles and due to fluid retention and neuropathy the Docetaxel was discontinued. Gemcitabine was given at 866m/m2 instead, with no other changes made to her chemotherapy regimen. She completed her final 2 cycles with that chemotherapy regimen.  Zoladex every 28 days.Adjuvant Trastuzumab every 21 days to complete out one calendar year of anti-HER-2 therapy    Lymphedema issues, if any:   Pain issues, if any:   SAFETY ISSUES:  Prior radiation? NO  Pacemaker/ICD? NO  Possible current pregnancy? NO             Is the patient on methotrexate? NO   Current Complaints / other details: right breast swollen and she was seen by Dr. MRolm Bookbinderwho aspirated bloody fluid and place her on Doxycycline on 03/23/14   former smoker x 15 years: 1 ppd, No alcohol, hx of Marijuana   Menses age 53 Parity age 53 G3, P3, BC x 5 years, No HRT   History of Intermittent leukopenia and neutropenia. Seen by Dr. HLamonte Sakaiand Dr. GAlvy Bimlerin the past   Cardiac Echo scheduled 09/29/13 - Hx of Palpitations,  Heart Murmur

## 2014-03-24 ENCOUNTER — Encounter: Payer: Self-pay | Admitting: Radiation Oncology

## 2014-03-24 ENCOUNTER — Ambulatory Visit
Admission: RE | Admit: 2014-03-24 | Discharge: 2014-03-24 | Disposition: A | Discharge status: Home / Self Care | Payer: BC Managed Care – PPO | Source: Ambulatory Visit | Attending: Radiation Oncology | Admitting: Radiation Oncology

## 2014-03-24 ENCOUNTER — Telehealth: Payer: Self-pay | Admitting: *Deleted

## 2014-03-24 VITALS — BP 152/94 | HR 117 | Temp 100.9°F | Ht 64.0 in | Wt 189.0 lb

## 2014-03-24 DIAGNOSIS — D0590 Unspecified type of carcinoma in situ of unspecified breast: Secondary | ICD-10-CM | POA: Insufficient documentation

## 2014-03-24 DIAGNOSIS — Z7982 Long term (current) use of aspirin: Secondary | ICD-10-CM | POA: Insufficient documentation

## 2014-03-24 DIAGNOSIS — Z170 Estrogen receptor positive status [ER+]: Secondary | ICD-10-CM | POA: Insufficient documentation

## 2014-03-24 DIAGNOSIS — Z79899 Other long term (current) drug therapy: Secondary | ICD-10-CM | POA: Insufficient documentation

## 2014-03-24 DIAGNOSIS — Z510 Encounter for antineoplastic radiation therapy: Secondary | ICD-10-CM | POA: Insufficient documentation

## 2014-03-24 DIAGNOSIS — C50519 Malignant neoplasm of lower-outer quadrant of unspecified female breast: Secondary | ICD-10-CM | POA: Diagnosis not present

## 2014-03-24 DIAGNOSIS — Z9221 Personal history of antineoplastic chemotherapy: Secondary | ICD-10-CM | POA: Insufficient documentation

## 2014-03-24 DIAGNOSIS — C50511 Malignant neoplasm of lower-outer quadrant of right female breast: Secondary | ICD-10-CM

## 2014-03-24 NOTE — Telephone Encounter (Signed)
Called patient home and sp[oke with her, she thought her appt was for 9am not 830, she said i'm sorry I'll be on my way shortly" will inform Dr.Squire and Patric Dykes, RN 8:41 AM

## 2014-03-24 NOTE — Progress Notes (Signed)
Radiation Oncology         [phone] ________________________________  Name: Tina [name] MRN: [id number]  Date: 03/24/2014  DOB: 07-29-1961  Follow-Up Visit Note  Outpatient  CC: Tina Kern., DO  Tina Eisenmenger, MD  Diagnosis: clinical T2N0M0 Stage II Invasive Ductal Carcinoma with DCIS, Right breast, Grade III, ER 7%, PR 6%, Her2 neu +. Ki67 70%; ypT1bN0 cM0 high grade carcinosarcoma (Stage I)  Narrative:  The patient returns today for follow-up. Patient was started on neoadjuvant Docetaxel, Carboplatin, Trastuzumab, Pertuzumab and received 4 cycles; due to fluid retention and neuropathy the Docetaxel was discontinued. Gemcitabine was given instead, with no other changes made to her chemotherapy regimen. She completed her final 2 cycles with that chemotherapy regimen, per Med/onc notes.  Following neoadjuvant chemotherapy, she underwent surgery in the form of lumpectomy and SLN biopsy on 03-01-14.  Pathology revealed ypT1bN0 high grade carcinosarcoma; margins negative but close to DCIS.  I have spoken with Dr Tina [name] and we do not feel re excision is necessary.  Postoperatively, she has right breast warmth and swelling. Little fluid aspirated this week by Dr Tina [name]. Taking doxycycline.              She continues Herceptin.  ALLERGIES:  is allergic to adhesive and nickel.  Meds: Current Outpatient Prescriptions  Medication Sig Dispense Refill  . ascorbic acid (VITAMIN C) 1000 MG tablet Take 1,000 mg by mouth daily.      Marland Kitchen aspirin 81 MG tablet Take 81 mg by mouth daily.      Marland Kitchen b complex vitamins capsule Take 1 capsule by mouth daily.      Marland Kitchen BIOTIN 5000 PO Take by mouth daily.       . Calcium Carbonate-Vit D-Min (CALTRATE 600+D PLUS MINERALS PO) Take 1 tablet by mouth daily.      . Cholecalciferol (VITAMIN D3) 10000 UNITS capsule Take 10,000 Units by mouth daily.      . Coenzyme Q10 (CO Q 10 PO) Take by mouth daily.      Marland Kitchen doxycycline (VIBRA-TABS) 100 MG tablet Take 1  tablet (100 mg total) by mouth 2 (two) times daily.  14 tablet  0  . gabapentin (NEURONTIN) 100 MG capsule Take 3 capsules (300 mg total) by mouth 3 (three) times daily.  270 capsule  2  . ibuprofen (ADVIL,MOTRIN) 800 MG tablet Take 1 tablet (800 mg total) by mouth every 8 (eight) hours as needed for mild pain.  30 tablet  0  . L-ARGININE PO Take by mouth daily.      Marland Kitchen lidocaine-prilocaine (EMLA) cream Apply topically as needed.  30 g  6  . lisinopril-hydrochlorothiazide (PRINZIDE,ZESTORETIC) 20-12.5 MG per tablet Take 2 tablets by mouth daily.  120 tablet  1  . LORazepam (ATIVAN) 0.5 MG tablet Take 1 tablet (0.5 mg total) by mouth every 6 (six) hours as needed (Nausea or vomiting).  30 tablet  0  . omeprazole (PRILOSEC) 40 MG capsule Take 1 capsule (40 mg total) by mouth daily.  30 capsule  6  . OVER THE COUNTER MEDICATION Take 1 tablet by mouth daily. Trace Minerals      . oxyCODONE (OXY IR/ROXICODONE) 5 MG immediate release tablet Take 1 tablet (5 mg total) by mouth every 6 (six) hours as needed for severe pain.  30 tablet  0  . oxyCODONE-acetaminophen (PERCOCET/ROXICET) 5-325 MG per tablet Take 1 tablet by mouth every 4 (four) hours as needed.  15 tablet  0  . Probiotic  Product (TRUBIOTICS) CAPS Take 1 capsule by mouth daily.      Marland Kitchen VITAMIN E PO Take 1 capsule by mouth daily.        No current facility-administered medications for this encounter.   Facility-Administered Medications Ordered in Other Encounters  Medication Dose Route Frequency Provider Last Rate Last Dose  . 0.9 %  sodium chloride infusion   Intravenous Continuous Tina Headland, NP 1,000 mL/hr at 12/26/13 0915    . prochlorperazine (COMPAZINE) injection 10 mg  10 mg Intravenous Q6H PRN Tina Headland, NP   10 mg at 12/26/13 7939    Physical Findings: The patient is in no acute distress. Patient is alert and oriented.  height is _0  (1.626 m) and weight is 189 lb (85.73 kg). Her temperature is 100.9 F (38.3 C).  Her blood pressure is 152/94 and her pulse is 117. Marland Kitchen Oropharynx clear.  No neck adenopathy. ++ right breast warmth and swelling  Lab Findings: Lab Results  Component Value Date   WBC 4.0 03/12/2014   HGB 12.7 03/12/2014   HCT 37.6 03/12/2014   MCV 98.5 03/12/2014   PLT 318 03/12/2014    PATH: Diagnosis 1. Breast, lumpectomy, right breast mass with radioactive seed - INVASIVE HIGH GRADE CARCINOSARCOMA (INVASIVE MAMMARY CARCINOMA WITH MALIGNANT HETEROLOGOUS DIFFERENTIATION). SEE COMMENT. - INVASIVE MAMMARY CARCINOMA, SEE COMMENT. - NEGATIVE FOR LYMPH VASCULAR INVASION. - INVASIVE TUMOR IS 7 MM FROM THE NEAREST MARGIN (INFERIOR) - DUCTAL CARCINOMA IN SITU - IN SITU CARCINOMA IS 1 MM FROM NEAREST MARGIN (POSTERIOR). - PREVIOUS BIOPSY SITE. - SEE TUMOR SYNOPTIC TEMPLATE BELOW. 2. Lymph node, sentinel, biopsy, right axillary #1 - ONE LYMPH NODE, NEGATIVE FOR TUMOR (0/1). 1 of 4 FINAL for Tina [name] ([id number]) Diagnosis(continued) 3. Lymph node, sentinel, biopsy, right axillary # 2 - ONE LYMPH NODE, NEGATIVE FOR TUMOR (0/1). 4. Lymph node, sentinel, biopsy, right axillary #3 - ONE LYMPH NODE, NEGATIVE FOR TUMOR (0/1). Microscopic Comment 1. BREAST, INVASIVE TUMOR, WITH LYMPH NODE SAMPLING Specimen, including laterality and lymph node sampling (sentinel, non-sentinel): Right breast with sentinel lymph node sampling. Procedure: Lumpectomy. Histologic type: High grade carcinoma with heterologous differentiation, see comment. Grade: III of III Tubule formation: 3 Nuclear pleomorphism: 3 Mitotic:3 Tumor size (gross measurement): 0.6 cm Margins: Invasive, distance to closest margin: 7 mm In-situ, distance to closest margin: 1 mm If margin positive, focally or broadly: N/A Lymphovascular invasion: Absent. Ductal carcinoma in situ: Present, pending stains Grade: II of III Extensive intraductal component: Absent. Lobular neoplasia: Absent. Tumor focality: Unifocal Treatment  effect: Present. If present, treatment effect in breast tissue, lymph nodes or both: Breast tissue Extent of tumor: Skin: N/A Nipple: N/A Skeletal muscle: N/A Lymph nodes: Examined: 3 Sentinel 0 Non-sentinel 3 Total Lymph nodes with metastasis: 0 Isolated tumor cells (< 0.2 mm): N/A Micrometastasis: (> 0.2 mm and < 2.0 mm): N/A Macrometastasis: (> 2.0 mm): N/A Extracapsular extension: N/A Breast prognostic profile: Estrogen receptor: Not repeated, previous study demonstrated 7% positivity ([id number]) Progesterone receptor: Not repeated, previous study demonstrated 6% positivity ([id number]) Her 2 neu: Repeated, previous study demonstrated amplification (3.55) ([id number]) Ki-67: Not repeated, previous study demonstrated 70% proliferation rate Non-neoplastic breast: Previous biopsy site, fibrocystic change, columnar cell/hyperplasia without atypia and microcalcifications. TNM: ypT1b, pN0, pMX Comments: The residual focus of high grade mammary carcinoma (immediately adjacent to the previous biopsy site) demonstrates malignant cartilaginous heterologous differentiation. The heterologous differentiation is confirmed with cytokeratin AE1/3 and S100 immunostains. (CRR:gt, 03/02/14)  Radiographic Findings: Dg Hip Complete Right  03/13/2014   CLINICAL DATA:  Breast cancer.  Hip pain.  No known injury.  EXAM: RIGHT HIP - COMPLETE 2+ VIEW  COMPARISON:  None.  FINDINGS: There is no evidence of hip fracture or dislocation. There is no evidence of arthropathy or other focal bone abnormality.  IMPRESSION: Negative.   Electronically Signed   By: Rolla Flatten M.D.   On: 03/13/2014 12:19   Nm Sentinel Node Inj-no Rpt (breast)  03/01/2014   CLINICAL DATA: right axillary sn biopsy   Sulfur colloid was injected intradermally by the nuclear medicine  technologist for breast cancer sentinel node localization.    Mm Breast Surgical Specimen  03/01/2014   CLINICAL DATA:  Status post radioactive seed  localization for excision.  EXAM: SPECIMEN RADIOGRAPH OF THE RIGHT BREAST  COMPARISON:  Previous exam(s)  FINDINGS: Status post excision of the right breast. The radioactive seed and biopsy marker clip are present, completely intact, and are marked for pathology.  IMPRESSION: Specimen radiograph of the right breast.   Electronically Signed   By: Shon Hale M.D.   On: 03/01/2014 10:27   Mm Rt Plc Breast Loc Dev   1st Lesion  Inc Mammo Guide  02/26/2014   CLINICAL DATA:  Preoperative localization for surgical excision of right breast carcinoma.  EXAM: MAMMOGRAPHIC GUIDED RADIOACTIVE SEED LOCALIZATION OF THE right BREAST  COMPARISON:  Previous exam(s)  FINDINGS: Patient presents for radioactive seed localization prior to surgical excision of right breast carcinoma. I met with the patient and we discussed the procedure of seed localization including benefits and alternatives. We discussed the high likelihood of a successful procedure. We discussed the risks of the procedure including infection, bleeding, tissue injury and further surgery. We discussed the low dose of radioactivity involved in the procedure. Informed, written consent was given.  The usual time-out protocol was performed immediately prior to the procedure.  Using mammographic guidance, sterile technique, 2% lidocaine and an I-125 radioactive seed, the clip was localized using a lateral approach. The follow-up mammogram images confirm the seed in the expected location and are marked for Dr. [name].  Follow-up survey of the patient confirms presence of radioactive seed.  Order number of I-125 seed:  [id number].  Dose of I-125 seed:  0.251 mCi.  The patient tolerated the procedure well and was released from the [facility]. She was given instructions regarding seed removal.  IMPRESSION: Radioactive seed localization of the known malignancy associated with the right breast. No apparent complications.   Electronically Signed   By: Luberta Robertson M.D.    On: 02/26/2014 16:31    Impression/Plan: She needs more time to heal from surgery and continue ABX. Plan to simulate in 3-4 weeks.  We discussed adjuvant radiotherapy today.  I recommend whole breast (Right) RT in order to decrease risk of recurrence by 2/3.  The risks, benefits and side effects of this treatment were discussed in detail.  She understands that radiotherapy is associated with skin irritation and fatigue in the acute setting. Late effects can include cosmetic changes and rare injury to internal organs.   She is enthusiastic about proceeding with treatment. A consent form was signed today. I look forward to participating in her care.  I spent 30 minutes face to face with the patient and more than 50% of that time was spent in counseling and/or coordination of care. _____________________________________   Eppie Gibson, MD

## 2014-03-25 NOTE — Addendum Note (Signed)
Encounter addended by: Deirdre Evener, RN on: 03/25/2014  5:22 PM<BR>     Documentation filed: Charges VN

## 2014-03-26 ENCOUNTER — Ambulatory Visit
Admission: RE | Admit: 2014-03-26 | Discharge: 2014-03-26 | Disposition: A | Discharge status: Home / Self Care | Payer: BC Managed Care – PPO | Source: Ambulatory Visit | Attending: General Surgery | Admitting: General Surgery

## 2014-03-26 ENCOUNTER — Encounter (INDEPENDENT_AMBULATORY_CARE_PROVIDER_SITE_OTHER): Payer: Self-pay | Admitting: General Surgery

## 2014-03-26 ENCOUNTER — Other Ambulatory Visit: Payer: Self-pay | Admitting: *Deleted

## 2014-03-26 ENCOUNTER — Other Ambulatory Visit (INDEPENDENT_AMBULATORY_CARE_PROVIDER_SITE_OTHER): Payer: Self-pay | Admitting: General Surgery

## 2014-03-26 ENCOUNTER — Ambulatory Visit (INDEPENDENT_AMBULATORY_CARE_PROVIDER_SITE_OTHER): Payer: BC Managed Care – PPO | Admitting: General Surgery

## 2014-03-26 ENCOUNTER — Telehealth (INDEPENDENT_AMBULATORY_CARE_PROVIDER_SITE_OTHER): Payer: Self-pay

## 2014-03-26 VITALS — BP 120/80 | HR 91 | Temp 98.1°F | Ht 64.0 in | Wt 188.1 lb

## 2014-03-26 DIAGNOSIS — T792XXS Traumatic secondary and recurrent hemorrhage and seroma, sequela: Principal | ICD-10-CM

## 2014-03-26 DIAGNOSIS — IMO0001 Reserved for inherently not codable concepts without codable children: Secondary | ICD-10-CM

## 2014-03-26 DIAGNOSIS — Z09 Encounter for follow-up examination after completed treatment for conditions other than malignant neoplasm: Secondary | ICD-10-CM

## 2014-03-26 DIAGNOSIS — T814XXS Infection following a procedure, sequela: Principal | ICD-10-CM

## 2014-03-26 DIAGNOSIS — N63 Unspecified lump in unspecified breast: Secondary | ICD-10-CM

## 2014-03-26 DIAGNOSIS — G629 Polyneuropathy, unspecified: Secondary | ICD-10-CM

## 2014-03-26 MED ORDER — GABAPENTIN 100 MG PO CAPS
300.0000 mg | ORAL_CAPSULE | Freq: Three times a day (TID) | ORAL | Status: DC
Start: 2014-03-26 — End: 2014-06-08

## 2014-03-26 NOTE — Progress Notes (Signed)
Patient ID: Tina [name], female   DOB: [birth year], 53 y.o.   MRN: [id number] Post op course 53 y/o s/p  R lumpectom and SLNB. The patient was referred to radiology for ultrasound guided aspiration. As per the patient there was minimal fluid in the axilla, there was no seroma, or abscess seen in the breast, and there was no attempt at aspiration.  The patient has been on antibiotics, she states that she is feeling better at this time. On Exam: Right breast edema, no palpable fluctuance  Assessment and Plan 53 year old female status post lumpectomy 1. Recommend continuing antibiotics at this time 2. Patient will follow up with Dr. [name] as heduled.  Ralene Ok, MD [facility] General & Minimally Invasive Surgery Trauma & Emergency Surgery

## 2014-03-26 NOTE — Addendum Note (Signed)
Encounter addended by: Deirdre Evener, RN on: 03/26/2014  8:38 PM<BR>     Documentation filed: Arn Medal VN

## 2014-03-26 NOTE — Telephone Encounter (Signed)
Pt calling stating that she feels that the seroma has become larger since Tuesday. Pt saw Dr Redmond Pulling however he was until to draw any fluid off on Tuesday. Spoke with Dr [name] her would like for the pt to go to the [facility] and have a U/S and have the breast center aspirate any fluid. Informed pt of Dr Cristal Generous recommendations. Pt verbalized understanding. Orders placed for U/S.

## 2014-03-29 ENCOUNTER — Ambulatory Visit: Payer: BC Managed Care – PPO | Attending: General Surgery | Admitting: Physical Therapy

## 2014-03-29 DIAGNOSIS — I890 Lymphedema, not elsewhere classified: Secondary | ICD-10-CM | POA: Insufficient documentation

## 2014-03-29 DIAGNOSIS — IMO0001 Reserved for inherently not codable concepts without codable children: Secondary | ICD-10-CM | POA: Insufficient documentation

## 2014-03-31 ENCOUNTER — Telehealth: Payer: Self-pay

## 2014-03-31 ENCOUNTER — Encounter: Payer: Self-pay | Admitting: *Deleted

## 2014-03-31 NOTE — Progress Notes (Signed)
[place] Work  Clinical Social Work received phone call from patient requesting information on financial assistance.  Patient reported that her home air conditioning "had gone out" and needed to be completely replaced.  Due to financial limitations patient is unable to afford a new air conditioning system at this time.  CSW and patient discussed community resources, and patient plans to contact the [employer] to inquire about possible assistance.  CSW and patient also discussed financial assistance organizations and patient is very interested.  CSW and patient are scheduled to meet during her chemo treatment on 01/30/52 to review applications.  CSW encouraged patient to call with any additional questions or concerns.   Johnnye Lana, MSW, LCSW, OSW-C Clinical Social Worker [facility] [phone]

## 2014-03-31 NOTE — Telephone Encounter (Signed)
Returned pt call re: pinches all over her body when she goes outside.  LMOVM - pt to return call to clinic.

## 2014-04-01 ENCOUNTER — Other Ambulatory Visit: Payer: Self-pay | Admitting: *Deleted

## 2014-04-01 ENCOUNTER — Other Ambulatory Visit: Payer: Self-pay

## 2014-04-01 ENCOUNTER — Telehealth: Payer: Self-pay

## 2014-04-01 ENCOUNTER — Other Ambulatory Visit: Payer: Self-pay | Admitting: Hematology and Oncology

## 2014-04-01 DIAGNOSIS — C50519 Malignant neoplasm of lower-outer quadrant of unspecified female breast: Secondary | ICD-10-CM

## 2014-04-01 DIAGNOSIS — G629 Polyneuropathy, unspecified: Secondary | ICD-10-CM

## 2014-04-01 NOTE — Telephone Encounter (Signed)
Returned pt call re: feeling like "biting/sticks/pricks/pinches"  all over her body whenever she goes outside.  Advised pt she should PCP.  Pt voiced understanding.  gabapentin refill needed - per chart refilled for qty 270 on 8/28.

## 2014-04-02 ENCOUNTER — Ambulatory Visit: Payer: BC Managed Care – PPO

## 2014-04-02 ENCOUNTER — Encounter: Payer: Self-pay | Admitting: *Deleted

## 2014-04-02 ENCOUNTER — Telehealth: Payer: Self-pay | Admitting: Hematology and Oncology

## 2014-04-02 ENCOUNTER — Telehealth: Payer: Self-pay | Admitting: *Deleted

## 2014-04-02 ENCOUNTER — Other Ambulatory Visit (HOSPITAL_BASED_OUTPATIENT_CLINIC_OR_DEPARTMENT_OTHER): Payer: BC Managed Care – PPO

## 2014-04-02 ENCOUNTER — Ambulatory Visit (HOSPITAL_BASED_OUTPATIENT_CLINIC_OR_DEPARTMENT_OTHER): Payer: BC Managed Care – PPO

## 2014-04-02 VITALS — BP 143/102 | HR 82 | Temp 99.2°F | Resp 20

## 2014-04-02 DIAGNOSIS — Z95828 Presence of other vascular implants and grafts: Secondary | ICD-10-CM

## 2014-04-02 DIAGNOSIS — C50519 Malignant neoplasm of lower-outer quadrant of unspecified female breast: Secondary | ICD-10-CM

## 2014-04-02 DIAGNOSIS — C50511 Malignant neoplasm of lower-outer quadrant of right female breast: Secondary | ICD-10-CM

## 2014-04-02 LAB — COMPREHENSIVE METABOLIC PANEL (CC13)
ALBUMIN: 3.5 g/dL (ref 3.5–5.0)
ALT: 23 U/L (ref 0–55)
ANION GAP: 11 meq/L (ref 3–11)
AST: 22 U/L (ref 5–34)
Alkaline Phosphatase: 59 U/L (ref 40–150)
BUN: 12.9 mg/dL (ref 7.0–26.0)
CALCIUM: 9.8 mg/dL (ref 8.4–10.4)
CHLORIDE: 102 meq/L (ref 98–109)
CO2: 27 meq/L (ref 22–29)
Creatinine: 0.9 mg/dL (ref 0.6–1.1)
GLUCOSE: 106 mg/dL (ref 70–140)
POTASSIUM: 3.5 meq/L (ref 3.5–5.1)
SODIUM: 139 meq/L (ref 136–145)
TOTAL PROTEIN: 8 g/dL (ref 6.4–8.3)
Total Bilirubin: 0.22 mg/dL (ref 0.20–1.20)

## 2014-04-02 LAB — CBC WITH DIFFERENTIAL/PLATELET
BASO%: 1.5 % (ref 0.0–2.0)
Basophils Absolute: 0 10*3/uL (ref 0.0–0.1)
EOS%: 1.1 % (ref 0.0–7.0)
Eosinophils Absolute: 0 10*3/uL (ref 0.0–0.5)
HCT: 37.3 % (ref 34.8–46.6)
HGB: 12.5 g/dL (ref 11.6–15.9)
LYMPH#: 1.5 10*3/uL (ref 0.9–3.3)
LYMPH%: 45.3 % (ref 14.0–49.7)
MCH: 31.4 pg (ref 25.1–34.0)
MCHC: 33.5 g/dL (ref 31.5–36.0)
MCV: 94 fL (ref 79.5–101.0)
MONO#: 0.2 10*3/uL (ref 0.1–0.9)
MONO%: 6.4 % (ref 0.0–14.0)
NEUT#: 1.6 10*3/uL (ref 1.5–6.5)
NEUT%: 45.7 % (ref 38.4–76.8)
PLATELETS: 390 10*3/uL (ref 145–400)
RBC: 3.97 10*6/uL (ref 3.70–5.45)
RDW: 12.8 % (ref 11.2–14.5)
WBC: 3.4 10*3/uL — AB (ref 3.9–10.3)

## 2014-04-02 MED ORDER — DIPHENHYDRAMINE HCL 25 MG PO CAPS
50.0000 mg | ORAL_CAPSULE | Freq: Once | ORAL | Status: AC
  Administered 2014-04-02: 50 mg via ORAL

## 2014-04-02 MED ORDER — SODIUM CHLORIDE 0.9 % IJ SOLN
10.0000 mL | INTRAMUSCULAR | Status: DC | PRN
  Administered 2014-04-02: 10 mL via INTRAVENOUS
  Filled 2014-04-02: qty 10

## 2014-04-02 MED ORDER — TRASTUZUMAB CHEMO INJECTION 440 MG
6.0000 mg/kg | Freq: Once | INTRAVENOUS | Status: AC
  Administered 2014-04-02: 504 mg via INTRAVENOUS
  Filled 2014-04-02: qty 24

## 2014-04-02 MED ORDER — HEPARIN SOD (PORK) LOCK FLUSH 100 UNIT/ML IV SOLN
500.0000 [IU] | Freq: Once | INTRAVENOUS | Status: AC | PRN
  Administered 2014-04-02: 500 [IU]
  Filled 2014-04-02: qty 5

## 2014-04-02 MED ORDER — DIPHENHYDRAMINE HCL 25 MG PO CAPS
ORAL_CAPSULE | ORAL | Status: AC
  Filled 2014-04-02: qty 2

## 2014-04-02 MED ORDER — ACETAMINOPHEN 325 MG PO TABS
ORAL_TABLET | ORAL | Status: AC
  Filled 2014-04-02: qty 2

## 2014-04-02 MED ORDER — SODIUM CHLORIDE 0.9 % IV SOLN
Freq: Once | INTRAVENOUS | Status: AC
  Administered 2014-04-02: 13:00:00 via INTRAVENOUS

## 2014-04-02 MED ORDER — DIPHENHYDRAMINE HCL 25 MG PO CAPS
ORAL_CAPSULE | ORAL | Status: AC
  Filled 2014-04-02: qty 1

## 2014-04-02 MED ORDER — ACETAMINOPHEN 325 MG PO TABS
650.0000 mg | ORAL_TABLET | Freq: Once | ORAL | Status: AC
  Administered 2014-04-02: 650 mg via ORAL

## 2014-04-02 MED ORDER — SODIUM CHLORIDE 0.9 % IJ SOLN
10.0000 mL | INTRAMUSCULAR | Status: DC | PRN
  Administered 2014-04-02: 10 mL
  Filled 2014-04-02: qty 10

## 2014-04-02 NOTE — Progress Notes (Signed)
Pt here for Herceptin only.  Pt stated she took blood pressure med this am - pt stated she takes twice daily Lisinopril-HCTZ.   Blood pressure taken and documented with increased readings.  Terri, desk nurse for Dr. Lindi Adie notified.  Nurse to inform Dr. Lindi Adie and will call this infusion nurse with further instructions.  Explanations given to pt and sister. 1400 -  Spoke with Karna Christmas, Chief Strategy Officer.  No new order per Dr. Lindi Adie.  Explanations given to pt and sister.  Pt understood to monitor blood pressure at home , and to notify PCP with changes.

## 2014-04-02 NOTE — Patient Instructions (Signed)

## 2014-04-02 NOTE — Patient Instructions (Signed)
[facility] Discharge Instructions for Patients Receiving Chemotherapy  Today you received the following chemotherapy agents :  Herceptin.  To help prevent nausea and vomiting after your treatment, we encourage you to take your nausea medication as instructed by your physician.   If you develop nausea and vomiting that is not controlled by your nausea medication, call the clinic.   BELOW ARE SYMPTOMS THAT SHOULD BE REPORTED IMMEDIATELY:  *FEVER GREATER THAN 100.5 F  *CHILLS WITH OR WITHOUT FEVER  NAUSEA AND VOMITING THAT IS NOT CONTROLLED WITH YOUR NAUSEA MEDICATION  *UNUSUAL SHORTNESS OF BREATH  *UNUSUAL BRUISING OR BLEEDING  TENDERNESS IN MOUTH AND THROAT WITH OR WITHOUT PRESENCE OF ULCERS  *URINARY PROBLEMS  *BOWEL PROBLEMS  UNUSUAL RASH Items with * indicate a potential emergency and should be followed up as soon as possible.  Feel free to call the clinic you have any questions or concerns. The clinic phone number is [phone].    

## 2014-04-02 NOTE — Telephone Encounter (Signed)
Per staff message and POF I have scheduled appts. Advised scheduler of appts. JMW  

## 2014-04-02 NOTE — Telephone Encounter (Signed)
per 2nd 9/4 pof inj on 9/15 moved to 9/14 @ 12pm. f/u on 9/25 already moved to [facility] per 1st 9/4 pof. lmonvm for pt - pt also mychart active. per pofs appts adjusted per pt request.

## 2014-04-02 NOTE — Progress Notes (Signed)
[place] Work  Clinical Social Work was referred by patient for assessment of psychosocial needs due to financial concerns.  Clinical Social Worker met with patient in chemo room to review [employer] [place] application and Sisters application for assistance.  Pt reports she was able to get a window a/c unit for the house and this is helping currently. Pt here with her sister today and was in good spirits. CSW also inquired about her ss disability as this was an issue before. Per pt, this is still pending. CSW encouraged her to call her case worker. Pt plans to work on the parts of applications that she can complete and then return to [facility] to complete the remainder. Pt aware to call with any questions.   Clinical Social Work interventions: Supportive listening Problem solving Resource education  Loren Racer, Portal Worker [place]  [place] Phone: [phone] Fax: [phone]

## 2014-04-07 ENCOUNTER — Ambulatory Visit: Payer: BC Managed Care – PPO | Attending: General Surgery | Admitting: Physical Therapy

## 2014-04-07 DIAGNOSIS — IMO0001 Reserved for inherently not codable concepts without codable children: Secondary | ICD-10-CM | POA: Diagnosis not present

## 2014-04-07 DIAGNOSIS — I890 Lymphedema, not elsewhere classified: Secondary | ICD-10-CM | POA: Insufficient documentation

## 2014-04-08 ENCOUNTER — Other Ambulatory Visit: Payer: Self-pay | Admitting: Orthopaedic Surgery

## 2014-04-08 DIAGNOSIS — M545 Low back pain, unspecified: Secondary | ICD-10-CM

## 2014-04-09 ENCOUNTER — Encounter (INDEPENDENT_AMBULATORY_CARE_PROVIDER_SITE_OTHER): Payer: BC Managed Care – PPO | Admitting: General Surgery

## 2014-04-12 ENCOUNTER — Ambulatory Visit: Payer: BC Managed Care – PPO | Admitting: Physical Therapy

## 2014-04-12 ENCOUNTER — Ambulatory Visit (HOSPITAL_BASED_OUTPATIENT_CLINIC_OR_DEPARTMENT_OTHER): Payer: BC Managed Care – PPO

## 2014-04-12 VITALS — BP 142/102 | HR 93 | Temp 98.9°F

## 2014-04-12 DIAGNOSIS — C50519 Malignant neoplasm of lower-outer quadrant of unspecified female breast: Secondary | ICD-10-CM

## 2014-04-12 DIAGNOSIS — Z5111 Encounter for antineoplastic chemotherapy: Secondary | ICD-10-CM

## 2014-04-12 DIAGNOSIS — C50511 Malignant neoplasm of lower-outer quadrant of right female breast: Secondary | ICD-10-CM

## 2014-04-12 DIAGNOSIS — IMO0001 Reserved for inherently not codable concepts without codable children: Secondary | ICD-10-CM | POA: Diagnosis not present

## 2014-04-12 MED ORDER — GOSERELIN ACETATE 3.6 MG ~~LOC~~ IMPL
3.6000 mg | DRUG_IMPLANT | Freq: Once | SUBCUTANEOUS | Status: AC
  Administered 2014-04-12: 3.6 mg via SUBCUTANEOUS
  Filled 2014-04-12: qty 3.6

## 2014-04-12 NOTE — Patient Instructions (Signed)
Goserelin injection What is this medicine? GOSERELIN (GOE se rel in) is similar to a hormone found in the body. It lowers the amount of sex hormones that the body makes. Men will have lower testosterone levels and women will have lower estrogen levels while taking this medicine. In men, this medicine is used to treat prostate cancer; the injection is either given once per month or once every 12 weeks. A once per month injection (only) is used to treat women with endometriosis, dysfunctional uterine bleeding, or advanced breast cancer. This medicine may be used for other purposes; ask your health care provider or pharmacist if you have questions. COMMON BRAND NAME(S): Zoladex What should I tell my health care provider before I take this medicine? They need to know if you have any of these conditions (some only apply to women): -diabetes -heart disease or previous heart attack -high blood pressure -high cholesterol -kidney disease -osteoporosis or low bone density -problems passing urine -spinal cord injury -stroke -tobacco smoker -an unusual or allergic reaction to goserelin, hormone therapy, other medicines, foods, dyes, or preservatives -pregnant or trying to get pregnant -breast-feeding How should I use this medicine? This medicine is for injection under the skin. It is given by a health care professional in a hospital or clinic setting. Men receive this injection once every 4 weeks or once every 12 weeks. Women will only receive the once every 4 weeks injection. Talk to your pediatrician regarding the use of this medicine in children. Special care may be needed. Overdosage: If you think you have taken too much of this medicine contact a poison control center or emergency room at once. NOTE: This medicine is only for you. Do not share this medicine with others. What if I miss a dose? It is important not to miss your dose. Call your doctor or health care professional if you are unable to  keep an appointment. What may interact with this medicine? -female hormones like estrogen -herbal or dietary supplements like black cohosh, chasteberry, or DHEA -female hormones like testosterone -prasterone This list may not describe all possible interactions. Give your health care provider a list of all the medicines, herbs, non-prescription drugs, or dietary supplements you use. Also tell them if you smoke, drink alcohol, or use illegal drugs. Some items may interact with your medicine. What should I watch for while using this medicine? Visit your doctor or health care professional for regular checks on your progress. Your symptoms may appear to get worse during the first weeks of this therapy. Tell your doctor or healthcare professional if your symptoms do not start to get better or if they get worse after this time. Your bones may get weaker if you take this medicine for a long time. If you smoke or frequently drink alcohol you may increase your risk of bone loss. A family history of osteoporosis, chronic use of drugs for seizures (convulsions), or corticosteroids can also increase your risk of bone loss. Talk to your doctor about how to keep your bones strong. This medicine should stop regular monthly menstration in women. Tell your doctor if you continue to menstrate. Women should not become pregnant while taking this medicine or for 12 weeks after stopping this medicine. Women should inform their doctor if they wish to become pregnant or think they might be pregnant. There is a potential for serious side effects to an unborn child. Talk to your health care professional or pharmacist for more information. Do not breast-feed an infant while taking   this medicine. Men should inform their doctors if they wish to father a child. This medicine may lower sperm counts. Talk to your health care professional or pharmacist for more information. What side effects may I notice from receiving this  medicine? Side effects that you should report to your doctor or health care professional as soon as possible: -allergic reactions like skin rash, itching or hives, swelling of the face, lips, or tongue -bone pain -breathing problems -changes in vision -chest pain -feeling faint or lightheaded, falls -fever, chills -pain, swelling, warmth in the leg -pain, tingling, numbness in the hands or feet -signs and symptoms of low blood pressure like dizziness; feeling faint or lightheaded, falls; unusually weak or tired -stomach pain -swelling of the ankles, feet, hands -trouble passing urine or change in the amount of urine -unusually high or low blood pressure -unusually weak or tired Side effects that usually do not require medical attention (report to your doctor or health care professional if they continue or are bothersome): -change in sex drive or performance -changes in breast size in both males and females -changes in emotions or moods -headache -hot flashes -irritation at site where injected -loss of appetite -skin problems like acne, dry skin -vaginal dryness This list may not describe all possible side effects. Call your doctor for medical advice about side effects. You may report side effects to FDA at 1-800-FDA-1088. Where should I keep my medicine? This drug is given in a hospital or clinic and will not be stored at home. NOTE: This sheet is a summary. It may not cover all possible information. If you have questions about this medicine, talk to your doctor, pharmacist, or health care provider.  2015, [employer]vier/Gold Standard. (2013-09-22 11:10:35)  

## 2014-04-13 ENCOUNTER — Ambulatory Visit: Payer: BC Managed Care – PPO

## 2014-04-14 ENCOUNTER — Ambulatory Visit: Payer: BC Managed Care – PPO | Admitting: Physical Therapy

## 2014-04-14 DIAGNOSIS — IMO0001 Reserved for inherently not codable concepts without codable children: Secondary | ICD-10-CM | POA: Diagnosis not present

## 2014-04-16 ENCOUNTER — Other Ambulatory Visit: Payer: BC Managed Care – PPO

## 2014-04-16 ENCOUNTER — Ambulatory Visit: Payer: BC Managed Care – PPO | Admitting: Hematology and Oncology

## 2014-04-16 NOTE — Progress Notes (Signed)
Goal: whole breast (Right) RT in order to decrease risk of recurrence by 2/3.

## 2014-04-17 ENCOUNTER — Other Ambulatory Visit: Payer: BC Managed Care – PPO

## 2014-04-19 ENCOUNTER — Ambulatory Visit
Admission: RE | Admit: 2014-04-19 | Discharge: 2014-04-19 | Disposition: A | Discharge status: Home / Self Care | Payer: BC Managed Care – PPO | Source: Ambulatory Visit | Attending: Radiation Oncology | Admitting: Radiation Oncology

## 2014-04-19 ENCOUNTER — Ambulatory Visit
Admission: RE | Admit: 2014-04-19 | Discharge: 2014-04-19 | Disposition: A | Discharge status: Home / Self Care | Payer: BC Managed Care – PPO | Source: Ambulatory Visit | Attending: Orthopaedic Surgery | Admitting: Orthopaedic Surgery

## 2014-04-19 VITALS — BP 161/97 | HR 86 | Temp 98.9°F | Resp 14 | Wt 192.8 lb

## 2014-04-19 DIAGNOSIS — C50511 Malignant neoplasm of lower-outer quadrant of right female breast: Secondary | ICD-10-CM

## 2014-04-19 DIAGNOSIS — Z510 Encounter for antineoplastic radiation therapy: Secondary | ICD-10-CM | POA: Diagnosis not present

## 2014-04-19 DIAGNOSIS — M545 Low back pain, unspecified: Secondary | ICD-10-CM

## 2014-04-19 NOTE — Progress Notes (Signed)
Radiation Oncology         [phone] ________________________________  Name: Tina [name] MRN: [id number]  Date: 04/19/2014  DOB: 04-30-1961  Follow-Up Visit Note  outpatient  CC: [name], DO  Rulon Eisenmenger, MD  Diagnosis: clinical [id number] Stage II Invasive Ductal Carcinoma with DCIS, Right breast, Grade III, ER 7%, PR 6%, Her2 neu +. Ki67 70%; ypT1bN0 cM0 high grade carcinosarcoma (Stage I)   Narrative:  The patient returns today for routine follow-up.  Her breast feels better. She reports she has seen Dr [name] and is cleared to proceed with RT.  She is off antibiotics.  She had planned a vacation in mid Oct but will reschedule to allow completion of RT.                              ALLERGIES:  is allergic to adhesive and nickel.  Meds: Current Outpatient Prescriptions  Medication Sig Dispense Refill  . ascorbic acid (VITAMIN C) 1000 MG tablet Take 1,000 mg by mouth daily.      Marland Kitchen aspirin 81 MG tablet Take 81 mg by mouth daily.      Marland Kitchen b complex vitamins capsule Take 1 capsule by mouth daily.      Marland Kitchen BIOTIN 5000 PO Take by mouth daily.       . Calcium Carbonate-Vit D-Min (CALTRATE 600+D PLUS MINERALS PO) Take 1 tablet by mouth daily.      . Cholecalciferol (VITAMIN D3) 10000 UNITS capsule Take 10,000 Units by mouth daily.      . Coenzyme Q10 (CO Q 10 PO) Take by mouth daily.      Marland Kitchen doxycycline (VIBRA-TABS) 100 MG tablet Take 1 tablet (100 mg total) by mouth 2 (two) times daily.  14 tablet  0  . gabapentin (NEURONTIN) 100 MG capsule Take 3 capsules (300 mg total) by mouth 3 (three) times daily.  270 capsule  1  . ibuprofen (ADVIL,MOTRIN) 800 MG tablet Take 1 tablet (800 mg total) by mouth every 8 (eight) hours as needed for mild pain.  30 tablet  0  . L-ARGININE PO Take by mouth daily.      Marland Kitchen lidocaine-prilocaine (EMLA) cream Apply topically as needed.  30 g  6  . lisinopril-hydrochlorothiazide (PRINZIDE,ZESTORETIC) 20-12.5 MG per tablet Take 2 tablets by mouth daily.   120 tablet  1  . LORazepam (ATIVAN) 0.5 MG tablet Take 1 tablet (0.5 mg total) by mouth every 6 (six) hours as needed (Nausea or vomiting).  30 tablet  0  . omeprazole (PRILOSEC) 40 MG capsule Take 1 capsule (40 mg total) by mouth daily.  30 capsule  6  . OVER THE COUNTER MEDICATION Take 1 tablet by mouth daily. Trace Minerals      . oxyCODONE (OXY IR/ROXICODONE) 5 MG immediate release tablet Take 1 tablet (5 mg total) by mouth every 6 (six) hours as needed for severe pain.  30 tablet  0  . oxyCODONE-acetaminophen (PERCOCET/ROXICET) 5-325 MG per tablet Take 1 tablet by mouth every 4 (four) hours as needed.  15 tablet  0  . Probiotic Product (TRUBIOTICS) CAPS Take 1 capsule by mouth daily.      Marland Kitchen VITAMIN E PO Take 1 capsule by mouth daily.        No current facility-administered medications for this encounter.   Facility-Administered Medications Ordered in Other Encounters  Medication Dose Route Frequency Provider Last Rate Last Dose  . 0.9 %  sodium chloride infusion   Intravenous Continuous Minette Headland, NP 1,000 mL/hr at 12/26/13 0915    . prochlorperazine (COMPAZINE) injection 10 mg  10 mg Intravenous Q6H PRN Minette Headland, NP   10 mg at 12/26/13 4008    Physical Findings: The patient is in no acute distress. Patient is alert and oriented. .     Vitals with Age-Percentiles 6/76/1948  Length   Systolic 932  Diastolic 97  Pulse 86  Respiration 14  Weight 87.454 kg  BMI   VISIT REPORT   Right breast - some residual seroma. But, no signs of infection(ie no warmth, fluctuance)   Lab Findings: Lab Results  Component Value Date   WBC 3.4* 04/02/2014   HGB 12.5 04/02/2014   HCT 37.3 04/02/2014   MCV 94.0 04/02/2014   PLT 390 04/02/2014    Radiographic Findings: US Breast Ltd Uni Right Inc Axilla  03/26/2014   CLINICAL DATA:  Status post right lumpectomy with sentinel node biopsy 03/01/2014. Patient has increasing tenderness and mass in the right breast. Fevers, redness, and  swelling of the right breast. Needle aspiration was attempted 3 days ago without fluid. The patient has noticed some improvement on 3 days of doxycycline.  EXAM: ULTRASOUND OF THE RIGHT BREAST  COMPARISON:  02/26/2014 and earlier  FINDINGS: On physical exam, the lateral portion of the right breast is edematous and firm. There is erythema, especially involving the 10 o'clock location of the right breast. Patient is tender on exam.  Ultrasound is performed, showing marked edema in the area of most tenderness and erythema in the 10 o'clock location 12 cm from the nipple. Small fluid collection is identified immediately below the axillary incision which measures 1.8 x 1.8 x 1.0 cm. At the 7 o'clock location of the right breast 6 cm from the nipple at the site of lumpectomy there is a simple appearing fluid collection which measures 3.5 x 3.6 x 2.2 cm.  IMPRESSION: 1. In the area of marked swelling and erythema, parenchymal edema is imaged sonographically. There is no evidence for abscess in this region. 2. Small fluid collection identified beneath the axillary incision. 3. Small seroma cavity identified at the lumpectomy site, approximately 12 cm from the point of maximum tenderness and erythema. 4. The findings were discussed with Elmo Putt in the office of [place] on 03/26/2014 at 2:00 pm. 5. The patient will be seen in the office by Dr. Rosendo Gros this afternoon.  RECOMMENDATION: Clinical followup and continued antibiotic treatment.  I have discussed the findings and recommendations with the patient. Results were also provided in writing at the conclusion of the visit. If applicable, a reminder letter will be sent to the patient regarding the next appointment.  BI-RADS CATEGORY  2: Benign.   Electronically Signed   By: Shon Hale M.D.   On: 03/26/2014 14:05    Impression/Plan:  Proceed with CT simulation today, start RT next week.  Patient enthusiastic to proceed.  I spent 15 minutes face to face with the  patient and more than 50% of that time was spent in counseling and/or coordination of care. _____________________________________   Eppie Gibson, MD

## 2014-04-19 NOTE — Progress Notes (Addendum)
Location of Breast Cancer: Right Breast, Lower Outer Quadrant  Histology per Pathology Report:  03/01/14  Diagnosis 1. Breast, lumpectomy, right breast mass with radioactive seed - INVASIVE HIGH GRADE CARCINOSARCOMA (INVASIVE MAMMARY CARCINOMA WITH MALIGNANT HETEROLOGOUS DIFFERENTIATION). SEE COMMENT. - INVASIVE MAMMARY CARCINOMA, SEE COMMENT. - NEGATIVE FOR LYMPH VASCULAR INVASION. - INVASIVE TUMOR IS 7 MM FROM THE NEAREST MARGIN (INFERIOR) - DUCTAL CARCINOMA IN SITU - IN SITU CARCINOMA IS 1 MM FROM NEAREST MARGIN (POSTERIOR). - PREVIOUS BIOPSY SITE. - SEE TUMOR SYNOPTIC TEMPLATE BELOW. 2. Lymph node, sentinel, biopsy, right axillary #1 - ONE LYMPH NODE, NEGATIVE FOR TUMOR (0/1) 3. Lymph node, sentinel, biopsy, right axillary # 2 - ONE LYMPH NODE, NEGATIVE FOR TUMOR (0/1). 4. Lymph node, sentinel, biopsy, right axillary #3 - ONE LYMPH NODE, NEGATIVE FOR TUMOR (0/1).  09/03/13 Diagnosis  Breast, right, needle core biopsy, 8 o'clock  - INVASIVE DUCTAL CARCINOMA.  - DUCTAL CARCINOMA IN SITU.  Receptor Status: ER(7%), PR (6%), Her2-neu (Amplification - HER2/NEU BY CISH - SHOWS AMPLIFICATION BY CISH ANALYSIS. RESULT  RATIO OF HER2: CEP 17 SIGNALS 2.22 AVERAGE HER2 COPY NUMBER PER CELL 3.55 REFERENCE RANGE, Ki-67 (70%)  Found on screening mammography?: Routine screening mammogram that showed a possible right breast mass. Magnification views show an oval, poorly defined mass in the lower outer quadrant of the right breast that contains small, poorly defined microcalcifications. U/S shows a 1.2x1.1x1 cm round hypoechoic mass with mildly irregular margins in the 8 oclock position of the right breast 4 cm from nipple. There are no abnormal appearing right axillary lymph nodes  Past/Anticipated interventions by surgeon, if LEX:NTZGYF Core Biopsy of the Right Breast   Past/Anticipated interventions by medical oncology, if any: Chemotherapy :Neoadjuvant Docetaxel, Carboplatin, Trastuzumab,  Pertuzumab that started on 10/02/13. She received treatment on day 1 of a 21 day cycle with Neulasta given on day 2 for granulocyte support. The patient received 4 cycles and due to fluid retention and neuropathy the Docetaxel was discontinued. Gemcitabine was given at 872m/m2 instead, with no other changes made to her chemotherapy regimen. She completed her final 2 cycles with that chemotherapy regimen.  Zoladex every 28 days.Adjuvant Trastuzumab every 21 days to complete out one calendar year of anti-HER-2 therapy    Lymphedema issues, if any: She reports she feels that her whole body is retaining fluid.  She reports her right side is larger than the left  Pain issues, if any: Reports she has been having headaches for the last week +.  She has been taking ibuprofen to alleviate the headache.   SAFETY ISSUES:  Prior radiation? NO  Pacemaker/ICD? NO  Possible current pregnancy? NO             Is the patient on methotrexate? NO   Current Complaints / other details: right breast swollen and she was seen by Dr. MRolm Bookbinderwho aspirated bloody fluid and place her on Doxycycline on 03/23/14   former smoker x 15 years: 1 ppd, No alcohol, hx of Marijuana   Menses age 53 Parity age 89 G3, P3, BC x 5 years, No HRT   History of Intermittent leukopenia and neutropenia. Seen by Dr. HLamonte Sakaiand Dr. GAlvy Bimlerin the past   Cardiac Echo scheduled 09/29/13 - Hx of Palpitations,  Heart Murmur

## 2014-04-20 ENCOUNTER — Ambulatory Visit: Payer: BC Managed Care – PPO | Admitting: Physical Therapy

## 2014-04-20 ENCOUNTER — Encounter: Payer: Self-pay | Admitting: Radiation Oncology

## 2014-04-20 DIAGNOSIS — IMO0001 Reserved for inherently not codable concepts without codable children: Secondary | ICD-10-CM | POA: Diagnosis not present

## 2014-04-20 NOTE — Progress Notes (Signed)
  Radiation Oncology         (336) [phone] ________________________________  Name: Tina [name] MRN: [id number]  Date: 04/19/2014  DOB: 04-May-1961  SIMULATION AND TREATMENT PLANNING NOTE  Outpatient  DIAGNOSIS:   ICD-9-CM ICD-10-CM  1. Malignant neoplasm of lower-outer quadrant of female breast, right 174.5 C50.511     NARRATIVE:  The patient was brought to the [facility].  Identity was confirmed.  All relevant records and images related to the planned course of therapy were reviewed.  The patient freely provided informed written consent to proceed with treatment after reviewing the details related to the planned course of therapy. The consent form was witnessed and verified by the simulation staff.    Then, the patient was set-up in a stable reproducible  supine position for radiation therapy.  CT images were obtained.  Surface markings were placed.  The CT images were loaded into the planning software.    TREATMENT PLANNING NOTE: Treatment planning then occurred.  The radiation prescription was entered and confirmed.    A total of 3 medically necessary complex treatment devices were fabricated and supervised by me - vaclock for head and ipsilateral arm, and opposed tangents with MLCs to block heart, lungs. I have requested : 3D Simulation  I have requested a DVH of the following structures: lungs, heart, lumpectomy cavity.   The patient will receive 50 Gy in 25 fractions to her right breast. This will be followed by a boost to the lumpectomy cavity of 10 Gy in 5 fractions.  Optical Surface Tracking Plan:  Since intensity modulated radiotherapy (IMRT) and 3D conformal radiation treatment methods are predicated on accurate and precise positioning for treatment, intrafraction motion monitoring is medically necessary to ensure accurate and safe treatment delivery. The ability to quantify intrafraction motion without excessive ionizing radiation dose can only be performed  with optical surface tracking. Accordingly, surface imaging offers the opportunity to obtain 3D measurements of patient position throughout IMRT and 3D treatments without excessive radiation exposure. I am ordering optical surface tracking for this patient's upcoming course of radiotherapy.  ________________________________   Reference:  Ursula Alert, J, et al. Surface imaging-based analysis of intrafraction motion for breast radiotherapy patients.Journal of [place], n. 6, nov. 2014. ISSN [id number].  Available at: <http://www.jacmp.org/index.php/jacmp/article/view/4957>.     -----------------------------------  Eppie Gibson, MD

## 2014-04-21 ENCOUNTER — Ambulatory Visit: Payer: BC Managed Care – PPO | Admitting: Physical Therapy

## 2014-04-21 DIAGNOSIS — Z510 Encounter for antineoplastic radiation therapy: Secondary | ICD-10-CM | POA: Diagnosis not present

## 2014-04-22 ENCOUNTER — Encounter: Payer: BC Managed Care – PPO | Admitting: Physical Therapy

## 2014-04-22 NOTE — Addendum Note (Signed)
Encounter addended by: Deirdre Evener, RN on: 04/22/2014  1:12 PM<BR>     Documentation filed: Charges VN

## 2014-04-23 ENCOUNTER — Ambulatory Visit: Payer: BC Managed Care – PPO

## 2014-04-23 ENCOUNTER — Ambulatory Visit (HOSPITAL_BASED_OUTPATIENT_CLINIC_OR_DEPARTMENT_OTHER): Payer: BC Managed Care – PPO

## 2014-04-23 ENCOUNTER — Encounter: Payer: Self-pay | Admitting: Adult Health

## 2014-04-23 ENCOUNTER — Ambulatory Visit (INDEPENDENT_AMBULATORY_CARE_PROVIDER_SITE_OTHER): Payer: BC Managed Care – PPO | Admitting: Family Medicine

## 2014-04-23 ENCOUNTER — Encounter: Payer: Self-pay | Admitting: Family Medicine

## 2014-04-23 ENCOUNTER — Ambulatory Visit (HOSPITAL_BASED_OUTPATIENT_CLINIC_OR_DEPARTMENT_OTHER): Payer: BC Managed Care – PPO | Admitting: Adult Health

## 2014-04-23 ENCOUNTER — Other Ambulatory Visit (HOSPITAL_BASED_OUTPATIENT_CLINIC_OR_DEPARTMENT_OTHER): Payer: BC Managed Care – PPO

## 2014-04-23 ENCOUNTER — Other Ambulatory Visit: Payer: Self-pay | Admitting: *Deleted

## 2014-04-23 ENCOUNTER — Telehealth: Payer: Self-pay | Admitting: *Deleted

## 2014-04-23 VITALS — BP 145/97 | HR 90 | Temp 98.8°F | Resp 18

## 2014-04-23 VITALS — BP 142/90 | HR 93 | Temp 98.6°F | Resp 18 | Wt 190.7 lb

## 2014-04-23 VITALS — BP 136/100 | HR 82 | Temp 98.5°F | Ht 64.0 in | Wt 193.0 lb

## 2014-04-23 DIAGNOSIS — Z5112 Encounter for antineoplastic immunotherapy: Secondary | ICD-10-CM

## 2014-04-23 DIAGNOSIS — Z170 Estrogen receptor positive status [ER+]: Secondary | ICD-10-CM

## 2014-04-23 DIAGNOSIS — C50519 Malignant neoplasm of lower-outer quadrant of unspecified female breast: Secondary | ICD-10-CM

## 2014-04-23 DIAGNOSIS — Z95828 Presence of other vascular implants and grafts: Secondary | ICD-10-CM

## 2014-04-23 DIAGNOSIS — Z510 Encounter for antineoplastic radiation therapy: Secondary | ICD-10-CM | POA: Diagnosis not present

## 2014-04-23 DIAGNOSIS — I10 Essential (primary) hypertension: Secondary | ICD-10-CM

## 2014-04-23 DIAGNOSIS — G609 Hereditary and idiopathic neuropathy, unspecified: Secondary | ICD-10-CM

## 2014-04-23 DIAGNOSIS — C50511 Malignant neoplasm of lower-outer quadrant of right female breast: Secondary | ICD-10-CM

## 2014-04-23 DIAGNOSIS — Z23 Encounter for immunization: Secondary | ICD-10-CM

## 2014-04-23 DIAGNOSIS — J320 Chronic maxillary sinusitis: Secondary | ICD-10-CM

## 2014-04-23 LAB — CBC WITH DIFFERENTIAL/PLATELET
BASO%: 1 % (ref 0.0–2.0)
Basophils Absolute: 0 10*3/uL (ref 0.0–0.1)
EOS%: 1.9 % (ref 0.0–7.0)
Eosinophils Absolute: 0.1 10*3/uL (ref 0.0–0.5)
HCT: 35.5 % (ref 34.8–46.6)
HGB: 12.1 g/dL (ref 11.6–15.9)
LYMPH%: 46.5 % (ref 14.0–49.7)
MCH: 31.6 pg (ref 25.1–34.0)
MCHC: 34.1 g/dL (ref 31.5–36.0)
MCV: 92.8 fL (ref 79.5–101.0)
MONO#: 0.2 10*3/uL (ref 0.1–0.9)
MONO%: 8 % (ref 0.0–14.0)
NEUT#: 1.2 10*3/uL — ABNORMAL LOW (ref 1.5–6.5)
NEUT%: 42.6 % (ref 38.4–76.8)
PLATELETS: 242 10*3/uL (ref 145–400)
RBC: 3.82 10*6/uL (ref 3.70–5.45)
RDW: 13.1 % (ref 11.2–14.5)
WBC: 2.9 10*3/uL — AB (ref 3.9–10.3)
lymph#: 1.3 10*3/uL (ref 0.9–3.3)

## 2014-04-23 LAB — COMPREHENSIVE METABOLIC PANEL (CC13)
ALK PHOS: 70 U/L (ref 40–150)
ALT: 18 U/L (ref 0–55)
ANION GAP: 9 meq/L (ref 3–11)
AST: 21 U/L (ref 5–34)
Albumin: 3.7 g/dL (ref 3.5–5.0)
BILIRUBIN TOTAL: 0.38 mg/dL (ref 0.20–1.20)
BUN: 14.1 mg/dL (ref 7.0–26.0)
CO2: 27 meq/L (ref 22–29)
CREATININE: 1 mg/dL (ref 0.6–1.1)
Calcium: 9.8 mg/dL (ref 8.4–10.4)
Chloride: 105 mEq/L (ref 98–109)
Glucose: 103 mg/dl (ref 70–140)
Potassium: 3.5 mEq/L (ref 3.5–5.1)
Sodium: 141 mEq/L (ref 136–145)
TOTAL PROTEIN: 7.7 g/dL (ref 6.4–8.3)

## 2014-04-23 MED ORDER — SODIUM CHLORIDE 0.9 % IJ SOLN
10.0000 mL | INTRAMUSCULAR | Status: DC | PRN
  Administered 2014-04-23: 10 mL via INTRAVENOUS
  Filled 2014-04-23: qty 10

## 2014-04-23 MED ORDER — SODIUM CHLORIDE 0.9 % IJ SOLN
10.0000 mL | INTRAMUSCULAR | Status: DC | PRN
  Administered 2014-04-23: 10 mL
  Filled 2014-04-23: qty 10

## 2014-04-23 MED ORDER — ACETAMINOPHEN 325 MG PO TABS
ORAL_TABLET | ORAL | Status: AC
  Filled 2014-04-23: qty 2

## 2014-04-23 MED ORDER — HEPARIN SOD (PORK) LOCK FLUSH 100 UNIT/ML IV SOLN
500.0000 [IU] | Freq: Once | INTRAVENOUS | Status: AC | PRN
  Administered 2014-04-23: 500 [IU]
  Filled 2014-04-23: qty 5

## 2014-04-23 MED ORDER — HEPARIN SOD (PORK) LOCK FLUSH 100 UNIT/ML IV SOLN
500.0000 [IU] | Freq: Once | INTRAVENOUS | Status: AC
  Administered 2014-04-23: 500 [IU] via INTRAVENOUS
  Filled 2014-04-23: qty 5

## 2014-04-23 MED ORDER — DOXYCYCLINE HYCLATE 100 MG PO TABS: 100.0000 mg | ORAL_TABLET | Freq: Two times a day (BID) | ORAL | Status: DC

## 2014-04-23 MED ORDER — ACETAMINOPHEN 325 MG PO TABS
650.0000 mg | ORAL_TABLET | Freq: Once | ORAL | Status: AC
  Administered 2014-04-23: 650 mg via ORAL

## 2014-04-23 MED ORDER — TRASTUZUMAB CHEMO INJECTION 440 MG
6.0000 mg/kg | Freq: Once | INTRAVENOUS | Status: AC
  Administered 2014-04-23: 504 mg via INTRAVENOUS
  Filled 2014-04-23: qty 24

## 2014-04-23 MED ORDER — IBUPROFEN 800 MG PO TABS: 800.0000 mg | ORAL_TABLET | Freq: Three times a day (TID) | ORAL | Status: DC | PRN

## 2014-04-23 MED ORDER — AMLODIPINE BESYLATE 5 MG PO TABS: 5.0000 mg | ORAL_TABLET | Freq: Every day | ORAL | Status: DC

## 2014-04-23 MED ORDER — DIPHENHYDRAMINE HCL 25 MG PO CAPS
ORAL_CAPSULE | ORAL | Status: AC
  Filled 2014-04-23: qty 2

## 2014-04-23 MED ORDER — INFLUENZA VAC SPLIT QUAD 0.5 ML IM SUSY
0.5000 mL | PREFILLED_SYRINGE | Freq: Once | INTRAMUSCULAR | Status: AC
  Administered 2014-04-23: 0.5 mL via INTRAMUSCULAR
  Filled 2014-04-23: qty 0.5

## 2014-04-23 MED ORDER — SODIUM CHLORIDE 0.9 % IV SOLN
Freq: Once | INTRAVENOUS | Status: AC
  Administered 2014-04-23: 11:00:00 via INTRAVENOUS

## 2014-04-23 MED ORDER — DIPHENHYDRAMINE HCL 25 MG PO CAPS
50.0000 mg | ORAL_CAPSULE | Freq: Once | ORAL | Status: AC
  Administered 2014-04-23: 50 mg via ORAL

## 2014-04-23 NOTE — Progress Notes (Signed)
Pre visit review using our clinic review tool, if applicable. No additional management support is needed unless otherwise documented below in the visit note. 

## 2014-04-23 NOTE — Patient Instructions (Signed)
Cancer Center Discharge Instructions for Patients Receiving Chemotherapy  Today you received the following chemotherapy agents Herceptin  To help prevent nausea and vomiting after your treatment, we encourage you to take your nausea medication     If you develop nausea and vomiting that is not controlled by your nausea medication, call the clinic.   BELOW ARE SYMPTOMS THAT SHOULD BE REPORTED IMMEDIATELY:  *FEVER GREATER THAN 100.5 F  *CHILLS WITH OR WITHOUT FEVER  NAUSEA AND VOMITING THAT IS NOT CONTROLLED WITH YOUR NAUSEA MEDICATION  *UNUSUAL SHORTNESS OF BREATH  *UNUSUAL BRUISING OR BLEEDING  TENDERNESS IN MOUTH AND THROAT WITH OR WITHOUT PRESENCE OF ULCERS  *URINARY PROBLEMS  *BOWEL PROBLEMS  UNUSUAL RASH Items with * indicate a potential emergency and should be followed up as soon as possible.  Feel free to call the clinic you have any questions or concerns. The clinic phone number is [phone].    

## 2014-04-23 NOTE — Telephone Encounter (Signed)
Per staff message and POF I have scheduled appts. Advised scheduler of appts. JMW  

## 2014-04-23 NOTE — Progress Notes (Addendum)
No chief complaint on file.   HPI:  Acute visit for:  1) Elevated Blood Pressure: -undergoing tx for breast cancer and seen by cardologist in 02/2014 for HTN and cardio-onc clinic for monitoring of serial echos with herceptin therapy -reports: BP mildly up at visit with onc today so advised to see doctor -denies: CP, SOB, DOE, palpitations -meds:asa, lisinopril-hctz (20-12.5) 2 tablets daily  2)Sinus Congestion: -for about 1 week -symptoms: nasal congestion, PND, sneezing, sinus pressure and pain on and off, ears full at times, HA on and off - has headaches in the past - resolves with ibuprofen -denies: SOB, DOE, fevers, persistent sinus pain  Note: I saw her once for a new pt visit in early 2015, she had her routine screening mammogram the next day and unfortunately she was  found to have breast cancer and has been undergoing chemotherapy for this.    ROS: See pertinent positives and negatives per HPI.  Past Medical History  Diagnosis Date  . Leukocytopenia   . Hypertension   . Degenerative joint disease   . Leukopenia 06/16/2013  . Needs flu shot 06/17/2013  . Cancer of right breast 09/03/13    Invasive Ductal Carcinoma/Ductal Carcinoma Insitu    Past Surgical History  Procedure Laterality Date  . Cesarean section      x3  . Carpal tunnel release  1998    rt  . Trigger finger release  2004    rt thumb  . Portacath placement  08/2013  . Tubal ligation      Family History  Problem Relation Age of Onset  . Hypertension Mother   . Dementia Mother     small vessel disease  . Cancer Mother 30    cervical  cancer  . Cancer Father 25    prostate ca  . Diabetes Father   . Arthritis Father     History   Social History  . Marital Status: Married    Spouse Name: N/A    Number of Children: 1  . Years of Education: N/A   Occupational History  .      homemaker   Social History Main Topics  . Smoking status: Former Smoker -- 1.00 packs/day for 15 years    Types:  Cigars, Cigarettes    Quit date: 02/23/2004  . Smokeless tobacco: Never Used     Comment: remote smoking history  . Alcohol Use: 0.0 oz/week    0 Shots of liquor per week     Comment: occ   . Drug Use: Yes     Comment: Marjuana  . Sexual Activity: Yes   Other Topics Concern  . 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 , Disp: , Rfl: ;  Calcium Carbonate-Vit D-Min (CALTRATE 600+D PLUS MINERALS PO), Take 1 tablet by mouth daily.,  Disp: , Rfl:  Cholecalciferol (VITAMIN D3) 10000 UNITS capsule, Take 10,000 Units by mouth daily., Disp: , Rfl: ;  Coenzyme Q10 (CO Q 10 PO), Take by mouth daily., Disp: , Rfl: ;  gabapentin (NEURONTIN) 100 MG capsule, Take 3 capsules (300 mg total) by mouth 3 (three) times daily., Disp: 270 capsule, Rfl: 1;  ibuprofen (ADVIL,MOTRIN) 800 MG tablet, Take 1 tablet (800 mg total) by mouth every 8 (eight) hours as needed for mild pain., Disp: 30 tablet, Rfl: 0 L-ARGININE PO, Take by mouth daily., Disp: , Rfl: ;  lidocaine-prilocaine (EMLA) cream, Apply topically as needed., Disp: 30 g, Rfl: 6;  lisinopril-hydrochlorothiazide (PRINZIDE,ZESTORETIC) 20-12.5 MG per tablet, Take 2 tablets by mouth daily., Disp: 120 tablet, Rfl: 1;  LORazepam (ATIVAN) 0.5 MG tablet, Take 1 tablet (0.5 mg total) by mouth every 6 (six) hours as needed (Nausea or vomiting)., Disp: 30 tablet, Rfl: 0 omeprazole (PRILOSEC) 40 MG capsule, Take 1 capsule (40 mg total) by mouth daily., Disp:  30 capsule, Rfl: 6;  OVER THE COUNTER MEDICATION, Take 1 tablet by mouth daily. Trace Minerals, Disp: , Rfl: ;  oxyCODONE (OXY IR/ROXICODONE) 5 MG immediate release tablet, Take 1 tablet (5 mg total) by mouth every 6 (six) hours as needed for severe pain., Disp: 30 tablet, Rfl: 0 oxyCODONE-acetaminophen (PERCOCET/ROXICET) 5-325 MG per tablet, Take 1 tablet by mouth every 4 (four) hours as needed., Disp: 15 tablet, Rfl: 0;  Probiotic Product (TRUBIOTICS) CAPS, Take 1 capsule by mouth daily., Disp: , Rfl: ;  VITAMIN E PO, Take 1 capsule by mouth daily. , Disp: , Rfl: ;  amLODipine (NORVASC) 5 MG tablet, Take 1 tablet (5 mg total) by mouth daily., Disp: 30 tablet, Rfl: 3 doxycycline (VIBRA-TABS) 100 MG tablet, Take 1 tablet (100 mg total) by mouth 2 (two) times daily., Disp: 20 tablet, Rfl: 0 No current facility-administered medications for this visit. Facility-Administered Medications Ordered in Other Visits: 0.9 %  sodium chloride infusion, , Intravenous, Continuous, Minette Headland, NP, Last Rate: 1,000 mL/hr at 12/26/13 0915;  prochlorperazine (COMPAZINE) injection 10 mg, 10 mg, Intravenous, Q6H PRN, Minette Headland, NP, 10 mg at 12/26/13 0917;  sodium chloride 0.9 % injection 10 mL, 10 mL, Intracatheter, PRN, Rulon Eisenmenger, MD, 10 mL at 04/23/14 1141  EXAM:  Filed Vitals:   04/23/14 1333  BP: 136/100  Pulse: 82  Temp: 98.5 F (36.9 C)    Body mass index is 33.11 kg/(m^2).  GENERAL: vitals reviewed and listed above, alert, oriented, appears well hydrated and in no acute distress  HEENT: atraumatic, conjunttiva clear, no obvious abnormalities on inspection of external nose and ears, normal appearance of ear canals and TMs, thick nasal congestion, mild post oropharyngeal erythema with PND, no tonsillar edema or exudate, R max sinus TTP  NECK: no obvious masses on inspection  LUNGS: clear to auscultation bilaterally, no wheezes, rales or rhonchi, good air movement  CV: HRRR, no  peripheral edema  MS: moves all extremities without noticeable abnormality  PSYCH: pleasant and cooperative, no obvious depression or anxiety  ASSESSMENT AND PLAN:  Discussed the following assessment and plan:  Essential hypertension - Plan: amLODipine (NORVASC) 5 MG tablet, Lipid Panel -discussed options and risks, start norvasc low dose -follow up with cardiologist as planned -follow up with min in 2-4 weeks to recheck -of course, we advised Brendan  to return or notify a doctor immediately if symptoms worsen or persist or new concerns arise.  Maxillary sinusitis, unspecified chronicity - Plan: doxycycline (  VIBRA-TABS) 100 MG tablet  -Patient advised to return or notify a doctor immediately if symptoms worsen or persist or new concerns arise.  Patient Instructions  -please continue your current combination blood pressure medication (lisinopril-hctz)  Please start a new medication for your blood pressure - Norvasc (amlodipine) 5 mg every day -we discussed the common and serious potential adverse effects of this medication and you can review these and more with the pharmacist when you pick up your medication.  Please follow the instructions for use carefully and notify us immediately if you have any problems taking this medication.  -please take the antibiotic for the sinus infection  -follow up in 2-4 weeks and come fasting - we will check your cholesterol and recheck your blood pressure - follow up sooner if any concerns or not improving       Tina Lahue R.

## 2014-04-23 NOTE — Patient Instructions (Signed)

## 2014-04-23 NOTE — Patient Instructions (Addendum)
-  please continue your current combination blood pressure medication (lisinopril-hctz)  Please start a new medication for your blood pressure - Norvasc (amlodipine) 5 mg every day -we discussed the common and serious potential adverse effects of this medication and you can review these and more with the pharmacist when you pick up your medication.  Please follow the instructions for use carefully and notify us immediately if you have any problems taking this medication.  -please take the antibiotic for the sinus infection  -please see your cardiologist as advised  -follow up in 2-4 weeks and come fasting - we will check your cholesterol and recheck your blood pressure - follow up sooner if any concerns or not improving

## 2014-04-23 NOTE — Progress Notes (Signed)
ID: [name] OB: [birth year]  MR#: [id number]  POL#:[id number]  PCP: [name], DO GYN:   SU: Dr. [name] OTHER MD: Dr. Haroldine Laws, Dr. [name] radiation   CHIEF COMPLAINT:Tina [name] 53 y.o. [place], [place] woman with clinical stage T2N0 IIA invasive ductal carcinoma, grade 3, ER positive, HER-2/neu positive invasive ductal carcinoma of the right breast   BREAST CANCER HISTORY:  1. Underwent screening mammogram. On the right side she was noted to have a mass with microcalcifications. Ultrasound in February 2015 showed a 1.2 x 1.1 x 1.0 cm rounded hypoechoic mass with irregular margins in the 8:00 position 4 cm from the nipple. No abnormal axillary lymph nodes were noted. She had MRI of the breasts performed that showed in the lower outer right breast 2.2 x 1.3 x 1.5 cm Irregular mass. In the upper outer left breast there was a 7 x 5 x 8 mm indeterminate enhancing mass. Also another 9 x 7 x 6 mm indeterminant enhancing mass at 9:00 in the left breast was noted.there are also noted to be ominent cortices of right axillary lymph nodes. Patient has undergone biopsy of these. On the right patient had biopsy performed of the 1.2 cm mass. The pathology revealed grade 3 invasive ductal carcinoma with ductal carcinoma in situ. Tumor was ER +70% PR +6% HER-2/neu was amplified and proliferation marker Ki-67 70%.   2. Referred to genetic counseling: testing was negative.   3.  Patient underwent a a right breast lumpectomy and sentinel node biopsy by Dr. [name] on 03/01/2014.  There was 0.6cm, grade III, residual high grade carcinoma with heterologous differentiation.  There was no lymphovascular invasion, and the invasive tumor was 40m away from the inferior margin and 135maway from the posterior margin. 3 sentinel nodes were negative for metastases.    CURRENT THERAPY: Herceptin  INTERVAL HISTORY:   FrRittas doing well today.  She is here for follow up today.  She does have a mildly elevated  blood pressure today.  She did have a MRI of lumbar spine due to right hip pain and is following up with ortho about this on Monday.  She is otherwise doing well.  She does have occasional headaches.  Her blood pressure is elevated from prior.  She doesn't notice them as bad when she takes Ibuprofen.   The headaches have been going on about two weeks.  She walks about 30-45 minutes per time, about 4-5 times per day.  Her numbness and tingling in her feet and hands has improved.  It is now only in her feet.  She is also working with therapy.  Otherwise, a 10 point ROS is neg.   REVIEW OF SYSTEMS:  A 10 point review of systems was conducted and is otherwise negative except for what is noted above.     PAST MEDICAL HISTORY: Past Medical History  Diagnosis Date  . Leukocytopenia   . Hypertension   . Degenerative joint disease   . Leukopenia 06/16/2013  . Needs flu shot 06/17/2013  . Cancer of right breast 09/03/13    Invasive Ductal Carcinoma/Ductal Carcinoma Insitu    PAST SURGICAL HISTORY: Past Surgical History  Procedure Laterality Date  . Cesarean section      x3  . Carpal tunnel release  1998    rt  . Trigger finger release  2004    rt thumb  . Portacath placement  08/2013  . Tubal ligation      FAMILY HISTORY Family History  Problem  Relation Age of Onset  . Hypertension Mother   . Dementia Mother     small vessel disease  . Cancer Mother 30    cervical  cancer  . Cancer Father 68    prostate ca  . Diabetes Father   . Arthritis Father     GYNECOLOGIC HISTORY: menarche at age 89, G48 P3, no h/o abnormal pap smears, LMP 09/2013 (on Zoladex), has h/o chlamydia at age 52. Used progesterone cream x 1 month.      SOCIAL HISTORY: Lives with husband of 3 years, Shaylin Blatt, in a 2 story house.  Drinks ETOH occasionally, has 10 pack year tobacco history, no illicit drug use.      ADVANCED DIRECTIVES: not in place   HEALTH MAINTENANCE: History  Substance Use Topics  .  Smoking status: Former Smoker -- 1.00 packs/day for 15 years    Types: Cigars, Cigarettes    Quit date: 02/23/2004  . Smokeless tobacco: Never Used     Comment: remote smoking history  . Alcohol Use: 0.0 oz/week    0 Shots of liquor per week     Comment: occ     Mammogram: 08/2013 Colonoscopy: not yet Bone Density Scan: n/a Pap Smear:  06/25/2012 Eye Exam: 03/2013 Vitamin D Level:  n/a Lipid Panel: 04/2013   Allergies  Allergen Reactions  . Adhesive [Tape] Itching    Burned and scarred skinned  . Nickel Rash    Current Outpatient Prescriptions  Medication Sig Dispense Refill  . ascorbic acid (VITAMIN C) 1000 MG tablet Take 1,000 mg by mouth daily.      Marland Kitchen aspirin 81 MG tablet Take 81 mg by mouth daily.      Marland Kitchen b complex vitamins capsule Take 1 capsule by mouth daily.      Marland Kitchen BIOTIN 5000 PO Take by mouth daily.       . Calcium Carbonate-Vit D-Min (CALTRATE 600+D PLUS MINERALS PO) Take 1 tablet by mouth daily.      . Cholecalciferol (VITAMIN D3) 10000 UNITS capsule Take 10,000 Units by mouth daily.      . Coenzyme Q10 (CO Q 10 PO) Take by mouth daily.      Marland Kitchen gabapentin (NEURONTIN) 100 MG capsule Take 3 capsules (300 mg total) by mouth 3 (three) times daily.  270 capsule  1  . L-ARGININE PO Take by mouth daily.      Marland Kitchen lidocaine-prilocaine (EMLA) cream Apply topically as needed.  30 g  6  . lisinopril-hydrochlorothiazide (PRINZIDE,ZESTORETIC) 20-12.5 MG per tablet Take 2 tablets by mouth daily.  120 tablet  1  . LORazepam (ATIVAN) 0.5 MG tablet Take 1 tablet (0.5 mg total) by mouth every 6 (six) hours as needed (Nausea or vomiting).  30 tablet  0  . omeprazole (PRILOSEC) 40 MG capsule Take 1 capsule (40 mg total) by mouth daily.  30 capsule  6  . OVER THE COUNTER MEDICATION Take 1 tablet by mouth daily. Trace Minerals      . Probiotic Product (TRUBIOTICS) CAPS Take 1 capsule by mouth daily.      Marland Kitchen VITAMIN E PO Take 1 capsule by mouth daily.       Marland Kitchen amLODipine (NORVASC) 5 MG tablet  Take 1 tablet (5 mg total) by mouth daily.  30 tablet  3  . doxycycline (VIBRA-TABS) 100 MG tablet Take 1 tablet (100 mg total) by mouth 2 (two) times daily.  20 tablet  0  . ibuprofen (ADVIL,MOTRIN) 800 MG tablet Take 1  tablet (800 mg total) by mouth every 8 (eight) hours as needed for mild pain.  30 tablet  0  . oxyCODONE (OXY IR/ROXICODONE) 5 MG immediate release tablet Take 1 tablet (5 mg total) by mouth every 6 (six) hours as needed for severe pain.  30 tablet  0  . oxyCODONE-acetaminophen (PERCOCET/ROXICET) 5-325 MG per tablet Take 1 tablet by mouth every 4 (four) hours as needed.  15 tablet  0   No current facility-administered medications for this visit.   Facility-Administered Medications Ordered in Other Visits  Medication Dose Route Frequency Provider Last Rate Last Dose  . 0.9 %  sodium chloride infusion   Intravenous Continuous Minette Headland, NP 1,000 mL/hr at 12/26/13 0915    . prochlorperazine (COMPAZINE) injection 10 mg  10 mg Intravenous Q6H PRN Minette Headland, NP   10 mg at 12/26/13 0917    OBJECTIVE: Filed Vitals:   04/23/14 0955  BP: 142/90  Pulse:   Temp:   Resp:      Body mass index is 32.72 kg/(m^2).     GENERAL: Patient is a well appearing female in no acute distress HEENT:  Sclerae anicteric.  Oropharynx clear and moist. No ulcerations or evidence of oropharyngeal candidiasis. Neck is supple.  NODES:  No cervical, supraclavicular, or axillary lymphadenopathy palpated.  BREAST EXAM: deferred LUNGS:  Clear to auscultation bilaterally.  No wheezes or rhonchi. HEART:  Regular rate and rhythm. No murmur appreciated. ABDOMEN:  Soft, nontender.  Positive, normoactive bowel sounds. No organomegaly palpated. MSK:  No focal spinal tenderness to palpation. Full range of motion bilaterally in the upper extremities. EXTREMITIES:  No peripheral edema.   SKIN:  Clear with no obvious rashes or skin changes. No nail dyscrasia. NEURO:  Nonfocal. Well oriented.   Appropriate affect. ECOG FS:1 - Symptomatic but completely ambulatory  LAB RESULTS:  CMP     Component Value Date/Time   NA 141 04/23/2014 0909   NA 138 09/25/2013 1320   K 3.5 04/23/2014 0909   K 3.6* 09/25/2013 1320   CL 98 09/25/2013 1320   CL 104 06/16/2012 1303   CO2 27 04/23/2014 0909   CO2 27 09/25/2013 1320   GLUCOSE 103 04/23/2014 0909   GLUCOSE 82 09/25/2013 1320   GLUCOSE 85 06/16/2012 1303   BUN 14.1 04/23/2014 0909   BUN 9 09/25/2013 1320   CREATININE 1.0 04/23/2014 0909   CREATININE 0.82 09/25/2013 1320   CALCIUM 9.8 04/23/2014 0909   CALCIUM 10.0 09/25/2013 1320   PROT 7.7 04/23/2014 0909   PROT 8.7* 10/04/2012 1830   ALBUMIN 3.7 04/23/2014 0909   ALBUMIN 4.1 10/04/2012 1830   AST 21 04/23/2014 0909   AST 32 10/04/2012 1830   ALT 18 04/23/2014 0909   ALT 27 10/04/2012 1830   ALKPHOS 70 04/23/2014 0909   ALKPHOS 42 10/04/2012 1830   BILITOT 0.38 04/23/2014 0909   BILITOT 0.7 10/04/2012 1830   GFRNONAA 82* 09/25/2013 1320   GFRAA >90 09/25/2013 1320    I No results found for this basename: SPEP,  UPEP,   kappa and lambda light chains    Lab Results  Component Value Date   WBC 2.9* 04/23/2014   NEUTROABS 1.2* 04/23/2014   HGB 12.1 04/23/2014   HCT 35.5 04/23/2014   MCV 92.8 04/23/2014   PLT 242 04/23/2014      Chemistry      Component Value Date/Time   NA 141 04/23/2014 0909   NA 138 09/25/2013 1320  K 3.5 04/23/2014 0909   K 3.6* 09/25/2013 1320   CL 98 09/25/2013 1320   CL 104 06/16/2012 1303   CO2 27 04/23/2014 0909   CO2 27 09/25/2013 1320   BUN 14.1 04/23/2014 0909   BUN 9 09/25/2013 1320   CREATININE 1.0 04/23/2014 0909   CREATININE 0.82 09/25/2013 1320      Component Value Date/Time   CALCIUM 9.8 04/23/2014 0909   CALCIUM 10.0 09/25/2013 1320   ALKPHOS 70 04/23/2014 0909   ALKPHOS 42 10/04/2012 1830   AST 21 04/23/2014 0909   AST 32 10/04/2012 1830   ALT 18 04/23/2014 0909   ALT 27 10/04/2012 1830   BILITOT 0.38 04/23/2014 0909   BILITOT 0.7 10/04/2012 1830       No results  found for this basename: LABCA2    No components found with this basename: LABCA125    No results found for this basename: INR,  in the last 168 hours  Urinalysis    Component Value Date/Time   LABSPEC 1.005 10/09/2013 1646   GLUCOSEU Negative 10/09/2013 1646   UROBILINOGEN 0.2 10/09/2013 1646    PATHOLOGY: 1. Breast, lumpectomy, right breast mass with radioactive seed - INVASIVE HIGH GRADE CARCINOSARCOMA (INVASIVE MAMMARY CARCINOMA WITH MALIGNANT HETEROLOGOUS DIFFERENTIATION). SEE COMMENT. - INVASIVE MAMMARY CARCINOMA, SEE COMMENT. - NEGATIVE FOR LYMPH VASCULAR INVASION. - INVASIVE TUMOR IS 7 MM FROM THE NEAREST MARGIN (INFERIOR) - DUCTAL CARCINOMA IN SITU - IN SITU CARCINOMA IS 1 MM FROM NEAREST MARGIN (POSTERIOR). - PREVIOUS BIOPSY SITE. - SEE TUMOR SYNOPTIC TEMPLATE BELOW. 2. Lymph node, sentinel, biopsy, right axillary #1 - ONE LYMPH NODE, NEGATIVE FOR TUMOR (0/1). 1 of 4 FINAL for SHENEIKA, WAL[place]AD ([id number]) Diagnosis(continued) 3. Lymph node, sentinel, biopsy, right axillary # 2 - ONE LYMPH NODE, NEGATIVE FOR TUMOR (0/1). 4. Lymph node, sentinel, biopsy, right axillary #3 - ONE LYMPH NODE, NEGATIVE FOR TUMOR (0/1). Microscopic Comment 1. BREAST, INVASIVE TUMOR, WITH LYMPH NODE SAMPLING Specimen, including laterality and lymph node sampling (sentinel, non-sentinel): Right breast with sentinel lymph node sampling. Procedure: Lumpectomy. Histologic type: High grade carcinoma with heterologous differentiation, see comment. Grade: III of III Tubule formation: 3 Nuclear pleomorphism: 3 Mitotic:3 Tumor size (gross measurement): 0.6 cm Margins: Invasive, distance to closest margin: 7 mm In-situ, distance to closest margin: 1 mm If margin positive, focally or broadly: N/A Lymphovascular invasion: Absent. Ductal carcinoma in situ: Present, pending stains Grade: II of III Extensive intraductal component: Absent. Lobular neoplasia: Absent. Tumor focality:  Unifocal Treatment effect: Present. If present, treatment effect in breast tissue, lymph nodes or both: Breast tissue Extent of tumor: Skin: N/A Nipple: N/A Skeletal muscle: N/A Lymph nodes: Examined: 3 Sentinel 0 Non-sentinel 3 Total Lymph nodes with metastasis: 0 Isolated tumor cells (< 0.2 mm): N/A Micrometastasis: (> 0.2 mm and < 2.0 mm): N/A Macrometastasis: (> 2.0 mm): N/A Extracapsular extension: N/A Breast prognostic profile: Estrogen receptor: Not repeated, previous study demonstrated 7% positivity ([id number]) Progesterone receptor: Not repeated, previous study demonstrated 6% positivity ([id number]) Her 2 neu: Repeated, previous study demonstrated amplification (3.55) ([id number]) Ki-67: Not repeated, previous study demonstrated 70% proliferation rate Non-neoplastic breast: Previous biopsy site, fibrocystic change, columnar cell/hyperplasia without atypia and microcalcifications. TNM: ypT1b, pN0, pMX Comments: The residual focus of high grade mammary carcinoma (immediately adjacent to the previous biopsy site) demonstrates malignant cartilaginous heterologous differentiation. The heterologous differentiation is confirmed with cytokeratin AE1/3 and S100 immunostains. (CRR:gt, 03/02/14) 2. , 3, and 4. These parts were reviewed with Dr. Gari Crown who concurs. 2  of 4 FINAL for CALEB, PRIGMORE ([id number]) [place] RUND DO Pathologist, Electronic Signature (Case signed 03/03/2014)  ASSESSMENT: 53 y.o. [place], [place] woman with clinical stage right breast T2N0 IIA invasive ductal carcinoma, grade 3, ER 7% PR 6%, Ki-67 70%, HER-2/neu positive.   1.  Patient was started on neoadjuvant Docetaxel, Carboplatin, Trastuzumab, Pertuzumab that started on 10/02/13. She received treatment on day 1 of a 21 day cycle with Neulasta given on day 2 for granulocyte support. The patient received 4 cycles and due to fluid retention and neuropathy the Docetaxel was discontinued.  Gemcitabine was given at  893m/m2 instead, with no other changes made to her chemotherapy regimen.  She completed her final 2 cycles with that chemotherapy regimen.  2. The patient does receive Zoladex every 28 days.    3.  Patient underwent a a right breast lumpectomy and sentinel node biopsy by Dr. WDonne Hazelon 03/01/2014.  There was 0.6cm, grade III, residual high grade carcinoma with heterologous differentiation.  There was no lymphovascular invasion, and the invasive tumor was 766maway from the inferior margin and 59m54mway from the posterior margin. 3 sentinel nodes were negative for metastases.    4. Adjuvant Trastuzumab every 21 days to complete out one calendar year of anti-HER-2 therapy.  5.  Radiation therapy to follow surgery.    6. Anti-estrogen therapy to follow radiation.    PLAN:   FreKeiasia doing well today.  She will proceed with Herceptin today.  I reviewed her labs with her today in detail.  Her white blood cells are slightly decreased, and we will monitor that for now.    FreMagalenees have a headache, which could be in part due to her hypertension.  These are relieved with Ibuprofen.  Natro, LPN did call her PCP and they will see her within the week to evaluate her BP and make changes if necessary.  Should her BP improve, and her headaches remain, I will order an MRI of her brain to ensure there is no metastatic involvement.  The patient is in agreement with this plan.    Milena's neuropathy is improving and she will continue taking Gabapentin 300m52mD.    FredFarons receive Zoladex every 4 weeks and will continue to receive this.  She will receive this on 10/12 and then at her next appt when she receives Herceptin.    Cardiac: Her last echocardiogram on 03/11/14 demonstrated a LVEF of 60-65%. She was evaluated by Dr. BeniVaughan Browner8/13/15 in the cardio-onc clinic. He reviewed her echo, and cleared her to continue with Herceptin.  Follow up was recommended in 3 months time.     Fred[place]aws to call us [place] the interim for any questions or concerns.  We can certainly see her sooner if needed.  I spent 25 minutes counseling the patient face to face.  The total time spent in the appointment was 30 minutes.  LindMinette Headland MCountry Club-[phone]/2015 8:17 AM

## 2014-04-26 ENCOUNTER — Ambulatory Visit
Admission: RE | Admit: 2014-04-26 | Discharge: 2014-04-26 | Disposition: A | Discharge status: Home / Self Care | Payer: BC Managed Care – PPO | Source: Ambulatory Visit | Attending: Radiation Oncology | Admitting: Radiation Oncology

## 2014-04-26 ENCOUNTER — Ambulatory Visit: Payer: BC Managed Care – PPO | Admitting: Physical Therapy

## 2014-04-26 DIAGNOSIS — C50511 Malignant neoplasm of lower-outer quadrant of right female breast: Secondary | ICD-10-CM

## 2014-04-26 DIAGNOSIS — IMO0001 Reserved for inherently not codable concepts without codable children: Secondary | ICD-10-CM | POA: Diagnosis not present

## 2014-04-26 DIAGNOSIS — Z510 Encounter for antineoplastic radiation therapy: Secondary | ICD-10-CM | POA: Diagnosis not present

## 2014-04-26 NOTE — Progress Notes (Signed)
Simulation Verification Note   ICD-9-CM ICD-10-CM  1. Malignant neoplasm of lower-outer quadrant of female breast, right 174.5 C50.511    The patient was brought to the treatment unit and placed in the planned treatment position. The clinical setup was verified. Then port films were obtained and uploaded to the radiation oncology medical record software.  The treatment beams were carefully compared against the planned radiation fields. The position location and shape of the radiation fields was reviewed. They targeted volume of tissue appears to be appropriately covered by the radiation beams. Organs at risk appear to be excluded as planned.  Based on my personal review, I approved the simulation verification. The patient's treatment will proceed as planned.  -----------------------------------  Eppie Gibson, MD

## 2014-04-27 ENCOUNTER — Ambulatory Visit
Admission: RE | Admit: 2014-04-27 | Discharge: 2014-04-27 | Disposition: A | Discharge status: Home / Self Care | Payer: BC Managed Care – PPO | Source: Ambulatory Visit | Attending: Radiation Oncology | Admitting: Radiation Oncology

## 2014-04-27 DIAGNOSIS — Z510 Encounter for antineoplastic radiation therapy: Secondary | ICD-10-CM | POA: Diagnosis not present

## 2014-04-28 ENCOUNTER — Ambulatory Visit
Admission: RE | Admit: 2014-04-28 | Discharge: 2014-04-28 | Disposition: A | Discharge status: Home / Self Care | Payer: BC Managed Care – PPO | Source: Ambulatory Visit | Attending: Radiation Oncology | Admitting: Radiation Oncology

## 2014-04-28 DIAGNOSIS — Z510 Encounter for antineoplastic radiation therapy: Secondary | ICD-10-CM | POA: Diagnosis not present

## 2014-04-29 ENCOUNTER — Encounter: Payer: Self-pay | Admitting: Family Medicine

## 2014-04-29 ENCOUNTER — Ambulatory Visit
Admission: RE | Admit: 2014-04-29 | Discharge: 2014-04-29 | Disposition: A | Discharge status: Home / Self Care | Payer: BC Managed Care – PPO | Source: Ambulatory Visit | Attending: Radiation Oncology | Admitting: Radiation Oncology

## 2014-04-29 DIAGNOSIS — N6459 Other signs and symptoms in breast: Secondary | ICD-10-CM | POA: Diagnosis not present

## 2014-04-29 DIAGNOSIS — Z510 Encounter for antineoplastic radiation therapy: Secondary | ICD-10-CM | POA: Insufficient documentation

## 2014-04-29 DIAGNOSIS — R234 Changes in skin texture: Secondary | ICD-10-CM | POA: Insufficient documentation

## 2014-04-29 DIAGNOSIS — C50511 Malignant neoplasm of lower-outer quadrant of right female breast: Secondary | ICD-10-CM | POA: Insufficient documentation

## 2014-04-30 ENCOUNTER — Ambulatory Visit
Admission: RE | Admit: 2014-04-30 | Discharge: 2014-04-30 | Disposition: A | Discharge status: Home / Self Care | Payer: BC Managed Care – PPO | Source: Ambulatory Visit | Attending: Radiation Oncology | Admitting: Radiation Oncology

## 2014-04-30 DIAGNOSIS — Z510 Encounter for antineoplastic radiation therapy: Secondary | ICD-10-CM | POA: Diagnosis not present

## 2014-04-30 DIAGNOSIS — C50511 Malignant neoplasm of lower-outer quadrant of right female breast: Secondary | ICD-10-CM

## 2014-04-30 MED ORDER — ALRA NON-METALLIC DEODORANT (RAD-ONC)
1.0000 "application " | Freq: Once | TOPICAL | Status: AC
  Administered 2014-04-30: 1 via TOPICAL

## 2014-04-30 MED ORDER — RADIAPLEXRX EX GEL
Freq: Once | CUTANEOUS | Status: AC
  Administered 2014-04-30: 13:00:00 via TOPICAL

## 2014-05-03 ENCOUNTER — Ambulatory Visit
Admission: RE | Admit: 2014-05-03 | Discharge: 2014-05-03 | Disposition: A | Discharge status: Home / Self Care | Payer: BC Managed Care – PPO | Source: Ambulatory Visit | Attending: Radiation Oncology | Admitting: Radiation Oncology

## 2014-05-03 ENCOUNTER — Ambulatory Visit: Payer: BC Managed Care – PPO | Attending: General Surgery | Admitting: Physical Therapy

## 2014-05-03 VITALS — BP 139/95 | HR 104 | Temp 98.2°F | Resp 16 | Wt 189.9 lb

## 2014-05-03 DIAGNOSIS — Z510 Encounter for antineoplastic radiation therapy: Secondary | ICD-10-CM | POA: Diagnosis not present

## 2014-05-03 DIAGNOSIS — Z5189 Encounter for other specified aftercare: Secondary | ICD-10-CM | POA: Insufficient documentation

## 2014-05-03 DIAGNOSIS — I890 Lymphedema, not elsewhere classified: Secondary | ICD-10-CM | POA: Insufficient documentation

## 2014-05-03 DIAGNOSIS — C50511 Malignant neoplasm of lower-outer quadrant of right female breast: Secondary | ICD-10-CM

## 2014-05-03 NOTE — Progress Notes (Signed)
She is currently in no pain. Pt complains of skin sensitiveness and itching and tenderness, Loss of Sleep, reports having hot flashes and left shoulder discomfort which disrupt her sleep.  She reports get does lymph message which helps the shoulder pain.  She reports sleeping a total of 8 hrs of sleep.  Pt right breast warm dry and intact, reports some extremity swelling.

## 2014-05-03 NOTE — Progress Notes (Signed)
   Weekly Management Note:  Outpatient    ICD-9-CM ICD-10-CM  1. Malignant neoplasm of lower-outer quadrant of female breast, right 174.5 C50.511    Current Dose:  10 Gy  Projected Dose: 60 Gy   Narrative:  The patient presents for routine under treatment assessment.  CBCT/MVCT images/Port film x-rays were reviewed.  The chart was checked. On ABX for ear infection. Breast feels warm on right, but no fever or chills  Physical Findings:  weight is 189 lb 14.4 oz (86.138 kg). Her oral temperature is 98.2 F (36.8 C). Her blood pressure is 139/95 and her pulse is 104. Her respiration is 16 and oxygen saturation is 100%.  NAD, no significant warmth or skin changes over breast, nontoxic appearing  Impression:  The patient is tolerating radiotherapy.  Plan:  Continue radiotherapy as planned.    ________________________________   Eppie Gibson, M.D.

## 2014-05-04 ENCOUNTER — Ambulatory Visit
Admission: RE | Admit: 2014-05-04 | Discharge: 2014-05-04 | Disposition: A | Discharge status: Home / Self Care | Payer: BC Managed Care – PPO | Source: Ambulatory Visit | Attending: Radiation Oncology | Admitting: Radiation Oncology

## 2014-05-04 DIAGNOSIS — Z510 Encounter for antineoplastic radiation therapy: Secondary | ICD-10-CM | POA: Diagnosis not present

## 2014-05-05 ENCOUNTER — Other Ambulatory Visit: Payer: Self-pay | Admitting: Adult Health

## 2014-05-05 ENCOUNTER — Ambulatory Visit
Admission: RE | Admit: 2014-05-05 | Discharge: 2014-05-05 | Disposition: A | Discharge status: Home / Self Care | Payer: BC Managed Care – PPO | Source: Ambulatory Visit | Attending: Radiation Oncology | Admitting: Radiation Oncology

## 2014-05-05 DIAGNOSIS — C50911 Malignant neoplasm of unspecified site of right female breast: Secondary | ICD-10-CM

## 2014-05-05 DIAGNOSIS — Z510 Encounter for antineoplastic radiation therapy: Secondary | ICD-10-CM | POA: Diagnosis not present

## 2014-05-06 ENCOUNTER — Ambulatory Visit
Admission: RE | Admit: 2014-05-06 | Discharge: 2014-05-06 | Disposition: A | Discharge status: Home / Self Care | Payer: BC Managed Care – PPO | Source: Ambulatory Visit | Attending: Radiation Oncology | Admitting: Radiation Oncology

## 2014-05-06 DIAGNOSIS — Z510 Encounter for antineoplastic radiation therapy: Secondary | ICD-10-CM | POA: Diagnosis not present

## 2014-05-07 ENCOUNTER — Ambulatory Visit
Admission: RE | Admit: 2014-05-07 | Discharge: 2014-05-07 | Disposition: A | Discharge status: Home / Self Care | Payer: BC Managed Care – PPO | Source: Ambulatory Visit | Attending: Radiation Oncology | Admitting: Radiation Oncology

## 2014-05-07 DIAGNOSIS — Z510 Encounter for antineoplastic radiation therapy: Secondary | ICD-10-CM | POA: Diagnosis not present

## 2014-05-10 ENCOUNTER — Inpatient Hospital Stay
Admission: RE | Admit: 2014-05-10 | Discharge: 2014-05-10 | Disposition: A | Discharge status: Home / Self Care | Payer: BC Managed Care – PPO | Source: Ambulatory Visit | Attending: Radiation Oncology | Admitting: Radiation Oncology

## 2014-05-10 ENCOUNTER — Telehealth: Payer: Self-pay | Admitting: Hematology and Oncology

## 2014-05-10 ENCOUNTER — Ambulatory Visit
Admission: RE | Admit: 2014-05-10 | Discharge: 2014-05-10 | Disposition: A | Discharge status: Home / Self Care | Payer: BC Managed Care – PPO | Source: Ambulatory Visit | Attending: Radiation Oncology | Admitting: Radiation Oncology

## 2014-05-10 ENCOUNTER — Ambulatory Visit: Payer: BC Managed Care – PPO | Admitting: Physical Therapy

## 2014-05-10 ENCOUNTER — Encounter: Payer: Self-pay | Admitting: Radiation Oncology

## 2014-05-10 ENCOUNTER — Ambulatory Visit: Payer: BC Managed Care – PPO

## 2014-05-10 VITALS — BP 118/85 | HR 91 | Temp 98.0°F | Ht 64.0 in | Wt 188.2 lb

## 2014-05-10 DIAGNOSIS — Z5189 Encounter for other specified aftercare: Secondary | ICD-10-CM | POA: Diagnosis not present

## 2014-05-10 DIAGNOSIS — C50511 Malignant neoplasm of lower-outer quadrant of right female breast: Secondary | ICD-10-CM

## 2014-05-10 DIAGNOSIS — Z510 Encounter for antineoplastic radiation therapy: Secondary | ICD-10-CM | POA: Diagnosis not present

## 2014-05-10 MED ORDER — RADIAPLEXRX EX GEL
Freq: Once | CUTANEOUS | Status: AC
  Administered 2014-05-10: 13:00:00 via TOPICAL

## 2014-05-10 NOTE — Telephone Encounter (Signed)
, °

## 2014-05-10 NOTE — Progress Notes (Signed)
Ms. Thomason has received 10 fractions to her right breat.  Note hyperpigmentation with a peau D'orange appearance around the areola region.    Reports soreness of her breast with periods of a pain level 8 - intermittently.  She also reports marked fatigue over the weekend with mild resolution and also note intermittent episodes of nausea without vomiting.  Notes she has a "cold" last week.

## 2014-05-10 NOTE — Progress Notes (Signed)
Weekly Management Note:  Site: Right breast Current Dose:  2000  cGy Projected Dose: 5000  CGy followed by right breast boost  Narrative: The patient is seen today for routine under treatment assessment. CBCT/MVCT images/port films were reviewed. The chart was reviewed.   She is without complaints today except for slight intermittent discomfort. She uses Radioplex gel.  Physical Examination:  Filed Vitals:   05/10/14 1207  BP: 118/85  Pulse: 91  Temp: 98 F (36.7 C)  .  Weight: 188 lb 3.2 oz (85.367 kg). There is mild hyperpigmentation the skin along the right breast. Otherwise no change.  Impression: Tolerating radiation therapy well.  Plan: Continue radiation therapy as planned.

## 2014-05-11 ENCOUNTER — Ambulatory Visit (HOSPITAL_BASED_OUTPATIENT_CLINIC_OR_DEPARTMENT_OTHER): Payer: BC Managed Care – PPO

## 2014-05-11 ENCOUNTER — Ambulatory Visit
Admission: RE | Admit: 2014-05-11 | Discharge: 2014-05-11 | Disposition: A | Discharge status: Home / Self Care | Payer: BC Managed Care – PPO | Source: Ambulatory Visit | Attending: Radiation Oncology | Admitting: Radiation Oncology

## 2014-05-11 VITALS — BP 120/70 | HR 98 | Temp 98.0°F

## 2014-05-11 DIAGNOSIS — Z510 Encounter for antineoplastic radiation therapy: Secondary | ICD-10-CM | POA: Diagnosis not present

## 2014-05-11 DIAGNOSIS — C50511 Malignant neoplasm of lower-outer quadrant of right female breast: Secondary | ICD-10-CM

## 2014-05-11 MED ORDER — GOSERELIN ACETATE 3.6 MG ~~LOC~~ IMPL
3.6000 mg | DRUG_IMPLANT | Freq: Once | SUBCUTANEOUS | Status: AC
  Administered 2014-05-11: 3.6 mg via SUBCUTANEOUS
  Filled 2014-05-11: qty 3.6

## 2014-05-11 NOTE — Patient Instructions (Signed)
Goserelin injection What is this medicine? GOSERELIN (GOE se rel in) is similar to a hormone found in the body. It lowers the amount of sex hormones that the body makes. Men will have lower testosterone levels and women will have lower estrogen levels while taking this medicine. In men, this medicine is used to treat prostate cancer; the injection is either given once per month or once every 12 weeks. A once per month injection (only) is used to treat women with endometriosis, dysfunctional uterine bleeding, or advanced breast cancer. This medicine may be used for other purposes; ask your health care provider or pharmacist if you have questions. COMMON BRAND NAME(S): Zoladex What should I tell my health care provider before I take this medicine? They need to know if you have any of these conditions (some only apply to women): -diabetes -heart disease or previous heart attack -high blood pressure -high cholesterol -kidney disease -osteoporosis or low bone density -problems passing urine -spinal cord injury -stroke -tobacco smoker -an unusual or allergic reaction to goserelin, hormone therapy, other medicines, foods, dyes, or preservatives -pregnant or trying to get pregnant -breast-feeding How should I use this medicine? This medicine is for injection under the skin. It is given by a health care professional in a hospital or clinic setting. Men receive this injection once every 4 weeks or once every 12 weeks. Women will only receive the once every 4 weeks injection. Talk to your pediatrician regarding the use of this medicine in children. Special care may be needed. Overdosage: If you think you have taken too much of this medicine contact a poison control center or emergency room at once. NOTE: This medicine is only for you. Do not share this medicine with others. What if I miss a dose? It is important not to miss your dose. Call your doctor or health care professional if you are unable to  keep an appointment. What may interact with this medicine? -female hormones like estrogen -herbal or dietary supplements like black cohosh, chasteberry, or DHEA -female hormones like testosterone -prasterone This list may not describe all possible interactions. Give your health care provider a list of all the medicines, herbs, non-prescription drugs, or dietary supplements you use. Also tell them if you smoke, drink alcohol, or use illegal drugs. Some items may interact with your medicine. What should I watch for while using this medicine? Visit your doctor or health care professional for regular checks on your progress. Your symptoms may appear to get worse during the first weeks of this therapy. Tell your doctor or healthcare professional if your symptoms do not start to get better or if they get worse after this time. Your bones may get weaker if you take this medicine for a long time. If you smoke or frequently drink alcohol you may increase your risk of bone loss. A family history of osteoporosis, chronic use of drugs for seizures (convulsions), or corticosteroids can also increase your risk of bone loss. Talk to your doctor about how to keep your bones strong. This medicine should stop regular monthly menstration in women. Tell your doctor if you continue to menstrate. Women should not become pregnant while taking this medicine or for 12 weeks after stopping this medicine. Women should inform their doctor if they wish to become pregnant or think they might be pregnant. There is a potential for serious side effects to an unborn child. Talk to your health care professional or pharmacist for more information. Do not breast-feed an infant while taking   this medicine. Men should inform their doctors if they wish to father a child. This medicine may lower sperm counts. Talk to your health care professional or pharmacist for more information. What side effects may I notice from receiving this  medicine? Side effects that you should report to your doctor or health care professional as soon as possible: -allergic reactions like skin rash, itching or hives, swelling of the face, lips, or tongue -bone pain -breathing problems -changes in vision -chest pain -feeling faint or lightheaded, falls -fever, chills -pain, swelling, warmth in the leg -pain, tingling, numbness in the hands or feet -signs and symptoms of low blood pressure like dizziness; feeling faint or lightheaded, falls; unusually weak or tired -stomach pain -swelling of the ankles, feet, hands -trouble passing urine or change in the amount of urine -unusually high or low blood pressure -unusually weak or tired Side effects that usually do not require medical attention (report to your doctor or health care professional if they continue or are bothersome): -change in sex drive or performance -changes in breast size in both males and females -changes in emotions or moods -headache -hot flashes -irritation at site where injected -loss of appetite -skin problems like acne, dry skin -vaginal dryness This list may not describe all possible side effects. Call your doctor for medical advice about side effects. You may report side effects to FDA at 1-800-FDA-1088. Where should I keep my medicine? This drug is given in a hospital or clinic and will not be stored at home. NOTE: This sheet is a summary. It may not cover all possible information. If you have questions about this medicine, talk to your doctor, pharmacist, or health care provider.  2015, [employer]vier/Gold Standard. (2013-09-22 11:10:35)  

## 2014-05-12 ENCOUNTER — Ambulatory Visit
Admission: RE | Admit: 2014-05-12 | Discharge: 2014-05-12 | Disposition: A | Discharge status: Home / Self Care | Payer: BC Managed Care – PPO | Source: Ambulatory Visit | Attending: Radiation Oncology | Admitting: Radiation Oncology

## 2014-05-12 ENCOUNTER — Other Ambulatory Visit: Payer: Self-pay

## 2014-05-12 DIAGNOSIS — Z510 Encounter for antineoplastic radiation therapy: Secondary | ICD-10-CM | POA: Diagnosis not present

## 2014-05-13 ENCOUNTER — Ambulatory Visit
Admission: RE | Admit: 2014-05-13 | Discharge: 2014-05-13 | Disposition: A | Discharge status: Home / Self Care | Payer: BC Managed Care – PPO | Source: Ambulatory Visit | Attending: Radiation Oncology | Admitting: Radiation Oncology

## 2014-05-13 DIAGNOSIS — Z510 Encounter for antineoplastic radiation therapy: Secondary | ICD-10-CM | POA: Diagnosis not present

## 2014-05-14 ENCOUNTER — Other Ambulatory Visit: Payer: Self-pay | Admitting: Hematology and Oncology

## 2014-05-14 ENCOUNTER — Ambulatory Visit
Admission: RE | Admit: 2014-05-14 | Discharge: 2014-05-14 | Disposition: A | Discharge status: Home / Self Care | Payer: BC Managed Care – PPO | Source: Ambulatory Visit | Attending: Radiation Oncology | Admitting: Radiation Oncology

## 2014-05-14 ENCOUNTER — Ambulatory Visit (HOSPITAL_BASED_OUTPATIENT_CLINIC_OR_DEPARTMENT_OTHER): Payer: BC Managed Care – PPO

## 2014-05-14 VITALS — BP 134/86 | HR 96

## 2014-05-14 DIAGNOSIS — C50511 Malignant neoplasm of lower-outer quadrant of right female breast: Secondary | ICD-10-CM

## 2014-05-14 DIAGNOSIS — Z5112 Encounter for antineoplastic immunotherapy: Secondary | ICD-10-CM

## 2014-05-14 DIAGNOSIS — Z510 Encounter for antineoplastic radiation therapy: Secondary | ICD-10-CM | POA: Diagnosis not present

## 2014-05-14 MED ORDER — SODIUM CHLORIDE 0.9 % IJ SOLN
10.0000 mL | INTRAMUSCULAR | Status: DC | PRN
  Administered 2014-05-14: 10 mL
  Filled 2014-05-14: qty 10

## 2014-05-14 MED ORDER — TRASTUZUMAB CHEMO INJECTION 440 MG
6.0000 mg/kg | Freq: Once | INTRAVENOUS | Status: AC
  Administered 2014-05-14: 504 mg via INTRAVENOUS
  Filled 2014-05-14: qty 24

## 2014-05-14 MED ORDER — SODIUM CHLORIDE 0.9 % IV SOLN
Freq: Once | INTRAVENOUS | Status: AC
  Administered 2014-05-14: 12:00:00 via INTRAVENOUS

## 2014-05-14 MED ORDER — ACETAMINOPHEN 325 MG PO TABS
650.0000 mg | ORAL_TABLET | Freq: Once | ORAL | Status: AC
  Administered 2014-05-14: 650 mg via ORAL

## 2014-05-14 MED ORDER — ACETAMINOPHEN 325 MG PO TABS
ORAL_TABLET | ORAL | Status: AC
  Filled 2014-05-14: qty 2

## 2014-05-14 MED ORDER — DIPHENHYDRAMINE HCL 25 MG PO CAPS
ORAL_CAPSULE | ORAL | Status: AC
  Filled 2014-05-14: qty 2

## 2014-05-14 MED ORDER — DIPHENHYDRAMINE HCL 25 MG PO CAPS
50.0000 mg | ORAL_CAPSULE | Freq: Once | ORAL | Status: AC
  Administered 2014-05-14: 50 mg via ORAL

## 2014-05-14 MED ORDER — HEPARIN SOD (PORK) LOCK FLUSH 100 UNIT/ML IV SOLN
500.0000 [IU] | Freq: Once | INTRAVENOUS | Status: AC | PRN
  Administered 2014-05-14: 500 [IU]
  Filled 2014-05-14: qty 5

## 2014-05-14 NOTE — Patient Instructions (Signed)
[facility] Discharge Instructions for Patients Receiving Chemotherapy  Today you received the following chemotherapy agents Herceptin.    If you develop nausea and vomiting that is not controlled by your nausea medication, call the clinic.   BELOW ARE SYMPTOMS THAT SHOULD BE REPORTED IMMEDIATELY:  *FEVER GREATER THAN 100.5 F  *CHILLS WITH OR WITHOUT FEVER  NAUSEA AND VOMITING THAT IS NOT CONTROLLED WITH YOUR NAUSEA MEDICATION  *UNUSUAL SHORTNESS OF BREATH  *UNUSUAL BRUISING OR BLEEDING  TENDERNESS IN MOUTH AND THROAT WITH OR WITHOUT PRESENCE OF ULCERS  *URINARY PROBLEMS  *BOWEL PROBLEMS  UNUSUAL RASH Items with * indicate a potential emergency and should be followed up as soon as possible.  Feel free to call the clinic you have any questions or concerns. The clinic phone number is [phone].

## 2014-05-14 NOTE — Progress Notes (Signed)
Patient c/o itching rash to right inner forearm, pain with swelling in both hands. Patient recently started on Norvasc for BP. Notified Mendel Ryder. Patient advised to use a good moisturizer to hands and apply hydrocortisone to rash. Advised to follow-up with PCP in relation to hand swelling/Norvasc.

## 2014-05-15 ENCOUNTER — Other Ambulatory Visit: Payer: Self-pay | Admitting: Family Medicine

## 2014-05-15 DIAGNOSIS — I10 Essential (primary) hypertension: Secondary | ICD-10-CM

## 2014-05-17 ENCOUNTER — Encounter: Payer: Self-pay | Admitting: Radiation Oncology

## 2014-05-17 ENCOUNTER — Ambulatory Visit: Payer: BC Managed Care – PPO | Admitting: Physical Therapy

## 2014-05-17 ENCOUNTER — Ambulatory Visit
Admission: RE | Admit: 2014-05-17 | Discharge: 2014-05-17 | Disposition: A | Discharge status: Home / Self Care | Payer: BC Managed Care – PPO | Source: Ambulatory Visit | Attending: Radiation Oncology | Admitting: Radiation Oncology

## 2014-05-17 ENCOUNTER — Telehealth: Payer: Self-pay | Admitting: *Deleted

## 2014-05-17 VITALS — BP 113/64 | HR 101 | Temp 99.2°F | Resp 14 | Wt 189.6 lb

## 2014-05-17 DIAGNOSIS — C50511 Malignant neoplasm of lower-outer quadrant of right female breast: Secondary | ICD-10-CM

## 2014-05-17 DIAGNOSIS — Z510 Encounter for antineoplastic radiation therapy: Secondary | ICD-10-CM | POA: Diagnosis not present

## 2014-05-17 DIAGNOSIS — Z5189 Encounter for other specified aftercare: Secondary | ICD-10-CM | POA: Diagnosis not present

## 2014-05-17 NOTE — Telephone Encounter (Signed)
Rx done. 

## 2014-05-17 NOTE — Progress Notes (Signed)
She rates her pain as a 3 on a scale of 0-10. Pt complains of itching, Loss of Sleep and Fatigue, pt reports having "hot flashes" and discomfort in her neck with causes disruptions in sleep.   Pt right breast warm dry and intact. Noted some hyperpigmentation over her right inner forearm, with painful swelling over bilateral hands.  Continues to applied Radiaplex as directed.   The patient eats a regular, healthy diet.

## 2014-05-17 NOTE — Telephone Encounter (Signed)
Called patient to ask about coming in before tx. Today so that Dr. [name] could see her before tx,. Per Dr. [name] request, lvm for a return call

## 2014-05-17 NOTE — Progress Notes (Signed)
   Weekly Management Note:  Outpatient    ICD-9-CM ICD-10-CM  1. Malignant neoplasm of lower-outer quadrant of female breast, right 174.5 C50.511    Current Dose:  30 Gy  Projected Dose: 60 Gy   Narrative:  The patient presents for routine under treatment assessment.  CBCT/MVCT images/Port film x-rays were reviewed.  The chart was checked. Has some insomnia. Skin is doing well, but some hyperpigmentation.  Physical Findings:  weight is 189 lb 9.6 oz (86.002 kg). Her oral temperature is 99.2 F (37.3 C). Her blood pressure is 113/64 and her pulse is 101. Her respiration is 14 and oxygen saturation is 99%.  right breast hyperpigmentation, skin intact  Impression:  The patient is tolerating radiotherapy.  Plan:  Continue radiotherapy as planned. Benadryl prn insomnia.  ________________________________   Eppie Gibson, M.D.

## 2014-05-18 ENCOUNTER — Ambulatory Visit
Admission: RE | Admit: 2014-05-18 | Discharge: 2014-05-18 | Disposition: A | Discharge status: Home / Self Care | Payer: BC Managed Care – PPO | Source: Ambulatory Visit | Attending: Radiation Oncology | Admitting: Radiation Oncology

## 2014-05-18 DIAGNOSIS — Z510 Encounter for antineoplastic radiation therapy: Secondary | ICD-10-CM | POA: Diagnosis not present

## 2014-05-19 ENCOUNTER — Encounter: Payer: BC Managed Care – PPO | Admitting: Family Medicine

## 2014-05-19 ENCOUNTER — Ambulatory Visit
Admission: RE | Admit: 2014-05-19 | Discharge: 2014-05-19 | Disposition: A | Discharge status: Home / Self Care | Payer: BC Managed Care – PPO | Source: Ambulatory Visit | Attending: Radiation Oncology | Admitting: Radiation Oncology

## 2014-05-19 DIAGNOSIS — Z510 Encounter for antineoplastic radiation therapy: Secondary | ICD-10-CM | POA: Diagnosis not present

## 2014-05-19 NOTE — Progress Notes (Signed)
Error   This encounter was created in error - please disregard. 

## 2014-05-20 ENCOUNTER — Ambulatory Visit
Admission: RE | Admit: 2014-05-20 | Discharge: 2014-05-20 | Disposition: A | Discharge status: Home / Self Care | Payer: BC Managed Care – PPO | Source: Ambulatory Visit | Attending: Radiation Oncology | Admitting: Radiation Oncology

## 2014-05-20 DIAGNOSIS — Z510 Encounter for antineoplastic radiation therapy: Secondary | ICD-10-CM | POA: Diagnosis not present

## 2014-05-21 ENCOUNTER — Ambulatory Visit
Admission: RE | Admit: 2014-05-21 | Discharge: 2014-05-21 | Disposition: A | Discharge status: Home / Self Care | Payer: BC Managed Care – PPO | Source: Ambulatory Visit | Attending: Radiation Oncology | Admitting: Radiation Oncology

## 2014-05-21 DIAGNOSIS — Z510 Encounter for antineoplastic radiation therapy: Secondary | ICD-10-CM | POA: Diagnosis not present

## 2014-05-24 ENCOUNTER — Encounter: Payer: Self-pay | Admitting: Radiation Oncology

## 2014-05-24 ENCOUNTER — Ambulatory Visit
Admission: RE | Admit: 2014-05-24 | Discharge: 2014-05-24 | Disposition: A | Discharge status: Home / Self Care | Payer: BC Managed Care – PPO | Source: Ambulatory Visit | Attending: Radiation Oncology | Admitting: Radiation Oncology

## 2014-05-24 ENCOUNTER — Ambulatory Visit: Payer: BC Managed Care – PPO | Admitting: Radiation Oncology

## 2014-05-24 VITALS — BP 121/83 | HR 95 | Temp 98.5°F | Ht 64.0 in | Wt 189.0 lb

## 2014-05-24 DIAGNOSIS — C50511 Malignant neoplasm of lower-outer quadrant of right female breast: Secondary | ICD-10-CM | POA: Diagnosis not present

## 2014-05-24 DIAGNOSIS — Z510 Encounter for antineoplastic radiation therapy: Secondary | ICD-10-CM | POA: Diagnosis not present

## 2014-05-24 MED ORDER — RADIAPLEXRX EX GEL
Freq: Once | CUTANEOUS | Status: AC
  Administered 2014-05-24: 12:00:00 via TOPICAL

## 2014-05-24 NOTE — Progress Notes (Signed)
   Weekly Management Note:  outpatient    ICD-9-CM ICD-10-CM   1. Malignant neoplasm of lower-outer quadrant of female breast, right 174.5 C50.511 hyaluronate sodium (RADIAPLEXRX) gel    Current Dose:  40 Gy  Projected Dose: 60 Gy   Narrative:  The patient presents for routine under treatment assessment.  CBCT/MVCT images/Port film x-rays were reviewed.  The chart was checked. Doing well  Physical Findings:  height is 5\' 4"  (1.626 m) and weight is 189 lb (85.73 kg). Her temperature is 98.5 F (36.9 C). Her blood pressure is 121/83 and her pulse is 95.  Diffuse .hyperpigmentation over right breast, skin intact  Impression:  The patient is tolerating radiotherapy.  Plan:  Continue radiotherapy as planned. Continue radiaplex and prn hydrocortisone 1% cream   ________________________________   Eppie Gibson, M.D.

## 2014-05-24 NOTE — Progress Notes (Signed)
Tina [name] has received 20 fractions to her right breast.  She reports intermittent shooting pains in her breast.  Note hyperpigmentation of the entire treatment field, but skin remains intact.  Peau d'Orange appearance.

## 2014-05-25 ENCOUNTER — Ambulatory Visit: Payer: BC Managed Care – PPO | Admitting: Physical Therapy

## 2014-05-25 ENCOUNTER — Ambulatory Visit
Admission: RE | Admit: 2014-05-25 | Discharge: 2014-05-25 | Disposition: A | Discharge status: Home / Self Care | Payer: BC Managed Care – PPO | Source: Ambulatory Visit | Attending: Radiation Oncology | Admitting: Radiation Oncology

## 2014-05-25 DIAGNOSIS — Z510 Encounter for antineoplastic radiation therapy: Secondary | ICD-10-CM | POA: Diagnosis not present

## 2014-05-26 ENCOUNTER — Ambulatory Visit: Payer: BC Managed Care – PPO | Admitting: Physical Therapy

## 2014-05-26 ENCOUNTER — Ambulatory Visit
Admission: RE | Admit: 2014-05-26 | Discharge: 2014-05-26 | Disposition: A | Discharge status: Home / Self Care | Payer: BC Managed Care – PPO | Source: Ambulatory Visit | Attending: Radiation Oncology | Admitting: Radiation Oncology

## 2014-05-26 DIAGNOSIS — Z510 Encounter for antineoplastic radiation therapy: Secondary | ICD-10-CM | POA: Diagnosis not present

## 2014-05-27 ENCOUNTER — Ambulatory Visit
Admission: RE | Admit: 2014-05-27 | Discharge: 2014-05-27 | Disposition: A | Discharge status: Home / Self Care | Payer: BC Managed Care – PPO | Source: Ambulatory Visit | Attending: Radiation Oncology | Admitting: Radiation Oncology

## 2014-05-27 DIAGNOSIS — Z510 Encounter for antineoplastic radiation therapy: Secondary | ICD-10-CM | POA: Diagnosis not present

## 2014-05-28 ENCOUNTER — Encounter: Payer: Self-pay | Admitting: Radiation Oncology

## 2014-05-28 ENCOUNTER — Ambulatory Visit
Admission: RE | Admit: 2014-05-28 | Discharge: 2014-05-28 | Disposition: A | Discharge status: Home / Self Care | Payer: BC Managed Care – PPO | Source: Ambulatory Visit | Attending: Radiation Oncology | Admitting: Radiation Oncology

## 2014-05-28 DIAGNOSIS — Z510 Encounter for antineoplastic radiation therapy: Secondary | ICD-10-CM | POA: Diagnosis not present

## 2014-05-31 ENCOUNTER — Encounter: Payer: Self-pay | Admitting: Radiation Oncology

## 2014-05-31 ENCOUNTER — Ambulatory Visit: Payer: BC Managed Care – PPO | Admitting: Radiation Oncology

## 2014-05-31 ENCOUNTER — Ambulatory Visit: Payer: BC Managed Care – PPO | Attending: General Surgery | Admitting: Physical Therapy

## 2014-05-31 ENCOUNTER — Ambulatory Visit
Admission: RE | Admit: 2014-05-31 | Discharge: 2014-05-31 | Disposition: A | Discharge status: Home / Self Care | Payer: BC Managed Care – PPO | Source: Ambulatory Visit | Attending: Radiation Oncology | Admitting: Radiation Oncology

## 2014-05-31 ENCOUNTER — Inpatient Hospital Stay
Admission: RE | Admit: 2014-05-31 | Discharge: 2014-05-31 | Disposition: A | Discharge status: Home / Self Care | Payer: BC Managed Care – PPO | Source: Ambulatory Visit | Attending: Radiation Oncology | Admitting: Radiation Oncology

## 2014-05-31 VITALS — BP 137/85 | HR 95 | Temp 98.8°F | Ht 64.0 in | Wt 189.8 lb

## 2014-05-31 DIAGNOSIS — I890 Lymphedema, not elsewhere classified: Secondary | ICD-10-CM

## 2014-05-31 DIAGNOSIS — Z5189 Encounter for other specified aftercare: Secondary | ICD-10-CM | POA: Diagnosis not present

## 2014-05-31 DIAGNOSIS — C50511 Malignant neoplasm of lower-outer quadrant of right female breast: Secondary | ICD-10-CM

## 2014-05-31 DIAGNOSIS — Z510 Encounter for antineoplastic radiation therapy: Secondary | ICD-10-CM | POA: Diagnosis not present

## 2014-05-31 NOTE — Therapy (Signed)
Physical Therapy Treatment  Patient Details  Name: Tina [name] MRN: [id number] Date of Birth: July 07, 1961  Encounter Date: 05/31/2014      PT End of Session - 05/31/14 1428    Visit Number 10   Number of Visits 20   Date for PT Re-Evaluation 07/05/14      Past Medical History  Diagnosis Date  . Leukocytopenia   . Hypertension   . Degenerative joint disease   . Leukopenia 06/16/2013  . Needs flu shot 06/17/2013  . Cancer of right breast 09/03/13    Invasive Ductal Carcinoma/Ductal Carcinoma Insitu    Past Surgical History  Procedure Laterality Date  . Cesarean section      x3  . Carpal tunnel release  1998    rt  . Trigger finger release  2004    rt thumb  . Portacath placement  08/2013  . Tubal ligation      There were no vitals taken for this visit.  Visit Diagnosis:  Lymphedema of breast          Adult PT Treatment/Exercise - 05/31/14 0700    Other Seated Lumbar Exercises seated on physio ball lateral  and anterior/posterior pevlic movement   Glut Set 10 reps   Clam 10 reps   Bent Knee Raise 10 reps   Dead Bug 10 reps  on soft foam roller   Bridge 10 reps  also did one legged bridges   Straight Leg Raise 10 reps   Straight Leg Raises Limitations on soft foam roller   Wall Squat 10 reps   Other Standing Exercises countertop push ups with core stabalized                Plan - 05/31/14 1411    Clinical Impression Statement Ms. Ground states she wants to work on her core to get as strong as she can to prepare for her abdominal surgery   Pt will benefit from skilled therapeutic intervention in order to improve on the following deficits Decreased strength;Pain   Rehab Potential Good   PT Frequency Min 2X/week   PT Duration Other (comment)  3 weeks   PT Treatment/Interventions Therapeutic activities;Therapeutic exercise;Patient/family education   PT Plan continue to work on core home exercise program next session         Problem  List Patient Active Problem List   Diagnosis Date Noted  . Peripheral neuropathy due to chemotherapy 03/12/2014  . Malignant neoplasm of breast (female), unspecified site 11/16/2013  . Malignant neoplasm of lower-outer quadrant of female breast 09/15/2013  . Left shoulder pain - seeing Dr. Lorin Mercy 08/20/2013  . Hypertension           Long Term Clinic Goals - 05/31/14 1415    Title Verbalize good understanding of the maintenance phase of treatment including manual lymph drainage, use of compression and lymphedema risk reduction practicies   Time 3   Period Weeks   Status Achieved   Title reduces edema by 75% at right lateral breast per patient reports   Time 3   Period Weeks   Status On-going   Title be independent with a home exercise program   Time 3   Period Weeks   Status On-going   Title report overall pain decreased by >/= 75 % to tolerate daily tasks with less pain   Time 3   Period Weeks   Status On-going   Title report a 50% decrease in fatigue   Time 3   Period Weeks  Status On-going          Norwood Levo 05/31/2014, 6:09 PM

## 2014-05-31 NOTE — Progress Notes (Signed)
    Weekly Management Note:  outpatient    ICD-9-CM ICD-10-CM   1. Malignant neoplasm of lower-outer quadrant of female breast, right 174.5 C50.511     Current Dose:  50 Gy  Projected Dose: 60 Gy   Narrative:  The patient presents for routine under treatment assessment.  CBCT/MVCT images/Port film x-rays were reviewed.  The chart was checked. Doing well -- hot flashes.  Physical Findings:  height is 5\' 4"  (1.626 m) and weight is 189 lb 12.8 oz (86.093 kg). Her temperature is 98.8 F (37.1 C). Her blood pressure is 137/85 and her pulse is 95.  Diffuse hyperpigmentation over right breast, with small fissure of desquamation in axilla  Impression:  The patient is tolerating radiotherapy.  Plan:  Continue radiotherapy as planned. Continue radiaplex and prn hydrocortisone 1% cream for itching; neosporin over axilla PRN skin breakdown   ________________________________   Eppie Gibson, M.D.

## 2014-06-01 ENCOUNTER — Encounter: Payer: Self-pay | Admitting: Radiation Oncology

## 2014-06-01 ENCOUNTER — Ambulatory Visit
Admission: RE | Admit: 2014-06-01 | Discharge: 2014-06-01 | Disposition: A | Discharge status: Home / Self Care | Payer: BC Managed Care – PPO | Source: Ambulatory Visit | Attending: Radiation Oncology | Admitting: Radiation Oncology

## 2014-06-01 DIAGNOSIS — Z510 Encounter for antineoplastic radiation therapy: Secondary | ICD-10-CM | POA: Diagnosis not present

## 2014-06-02 ENCOUNTER — Ambulatory Visit
Admission: RE | Admit: 2014-06-02 | Discharge: 2014-06-02 | Disposition: A | Discharge status: Home / Self Care | Payer: BC Managed Care – PPO | Source: Ambulatory Visit | Attending: Radiation Oncology | Admitting: Radiation Oncology

## 2014-06-02 DIAGNOSIS — Z510 Encounter for antineoplastic radiation therapy: Secondary | ICD-10-CM | POA: Diagnosis not present

## 2014-06-02 NOTE — Progress Notes (Signed)
Simulation Verification Note  The patient was brought to the treatment unit and placed in the planned treatment position. The clinical setup was verified. Then port films were obtained and uploaded to the radiation oncology medical record software.  The treatment beams were carefully compared against the planned radiation fields. The position location and shape of the radiation fields was reviewed. They targeted volume of tissue appears to be appropriately covered by the radiation beams. Organs at risk appear to be excluded as planned.  Based on my personal review, I approved the simulation verification. The patient's treatment will proceed as planned.  -----------------------------------  Kathi Dohn, MD  

## 2014-06-03 ENCOUNTER — Ambulatory Visit
Admission: RE | Admit: 2014-06-03 | Discharge: 2014-06-03 | Disposition: A | Discharge status: Home / Self Care | Payer: BC Managed Care – PPO | Source: Ambulatory Visit | Attending: Radiation Oncology | Admitting: Radiation Oncology

## 2014-06-03 DIAGNOSIS — Z510 Encounter for antineoplastic radiation therapy: Secondary | ICD-10-CM | POA: Diagnosis not present

## 2014-06-04 ENCOUNTER — Ambulatory Visit (HOSPITAL_BASED_OUTPATIENT_CLINIC_OR_DEPARTMENT_OTHER): Payer: BC Managed Care – PPO

## 2014-06-04 ENCOUNTER — Ambulatory Visit: Payer: BC Managed Care – PPO

## 2014-06-04 ENCOUNTER — Ambulatory Visit
Admission: RE | Admit: 2014-06-04 | Discharge: 2014-06-04 | Disposition: A | Discharge status: Home / Self Care | Payer: BC Managed Care – PPO | Source: Ambulatory Visit | Attending: Radiation Oncology | Admitting: Radiation Oncology

## 2014-06-04 ENCOUNTER — Ambulatory Visit (HOSPITAL_BASED_OUTPATIENT_CLINIC_OR_DEPARTMENT_OTHER): Payer: BC Managed Care – PPO | Admitting: Adult Health

## 2014-06-04 ENCOUNTER — Other Ambulatory Visit (HOSPITAL_BASED_OUTPATIENT_CLINIC_OR_DEPARTMENT_OTHER): Payer: BC Managed Care – PPO

## 2014-06-04 ENCOUNTER — Telehealth: Payer: Self-pay | Admitting: *Deleted

## 2014-06-04 ENCOUNTER — Encounter: Payer: Self-pay | Admitting: Adult Health

## 2014-06-04 ENCOUNTER — Other Ambulatory Visit: Payer: Self-pay | Admitting: Oncology

## 2014-06-04 ENCOUNTER — Telehealth: Payer: Self-pay | Admitting: Adult Health

## 2014-06-04 VITALS — BP 121/74 | HR 95 | Temp 99.0°F | Resp 18 | Ht 64.0 in | Wt 188.1 lb

## 2014-06-04 DIAGNOSIS — M549 Dorsalgia, unspecified: Secondary | ICD-10-CM

## 2014-06-04 DIAGNOSIS — F419 Anxiety disorder, unspecified: Secondary | ICD-10-CM

## 2014-06-04 DIAGNOSIS — Z170 Estrogen receptor positive status [ER+]: Secondary | ICD-10-CM

## 2014-06-04 DIAGNOSIS — C50511 Malignant neoplasm of lower-outer quadrant of right female breast: Secondary | ICD-10-CM

## 2014-06-04 DIAGNOSIS — C50519 Malignant neoplasm of lower-outer quadrant of unspecified female breast: Secondary | ICD-10-CM

## 2014-06-04 DIAGNOSIS — Z5112 Encounter for antineoplastic immunotherapy: Secondary | ICD-10-CM

## 2014-06-04 DIAGNOSIS — Z510 Encounter for antineoplastic radiation therapy: Secondary | ICD-10-CM | POA: Diagnosis not present

## 2014-06-04 DIAGNOSIS — M546 Pain in thoracic spine: Secondary | ICD-10-CM

## 2014-06-04 DIAGNOSIS — E860 Dehydration: Secondary | ICD-10-CM

## 2014-06-04 LAB — COMPREHENSIVE METABOLIC PANEL (CC13)
ALBUMIN: 4 g/dL (ref 3.5–5.0)
ALT: 24 U/L (ref 0–55)
ANION GAP: 10 meq/L (ref 3–11)
AST: 21 U/L (ref 5–34)
Alkaline Phosphatase: 62 U/L (ref 40–150)
BUN: 13 mg/dL (ref 7.0–26.0)
CALCIUM: 9.9 mg/dL (ref 8.4–10.4)
CHLORIDE: 103 meq/L (ref 98–109)
CO2: 27 meq/L (ref 22–29)
Creatinine: 0.8 mg/dL (ref 0.6–1.1)
Glucose: 99 mg/dl (ref 70–140)
POTASSIUM: 3.5 meq/L (ref 3.5–5.1)
Sodium: 140 mEq/L (ref 136–145)
Total Bilirubin: 0.63 mg/dL (ref 0.20–1.20)
Total Protein: 8 g/dL (ref 6.4–8.3)

## 2014-06-04 LAB — CBC WITH DIFFERENTIAL/PLATELET
BASO%: 0.7 % (ref 0.0–2.0)
BASOS ABS: 0 10*3/uL (ref 0.0–0.1)
EOS%: 1.1 % (ref 0.0–7.0)
Eosinophils Absolute: 0 10*3/uL (ref 0.0–0.5)
HEMATOCRIT: 36 % (ref 34.8–46.6)
HEMOGLOBIN: 12.5 g/dL (ref 11.6–15.9)
LYMPH#: 0.8 10*3/uL — AB (ref 0.9–3.3)
LYMPH%: 27.5 % (ref 14.0–49.7)
MCH: 30.9 pg (ref 25.1–34.0)
MCHC: 34.7 g/dL (ref 31.5–36.0)
MCV: 88.9 fL (ref 79.5–101.0)
MONO#: 0.2 10*3/uL (ref 0.1–0.9)
MONO%: 7.6 % (ref 0.0–14.0)
NEUT#: 1.7 10*3/uL (ref 1.5–6.5)
NEUT%: 63.1 % (ref 38.4–76.8)
Platelets: 237 10*3/uL (ref 145–400)
RBC: 4.05 10*6/uL (ref 3.70–5.45)
RDW: 12.9 % (ref 11.2–14.5)
WBC: 2.8 10*3/uL — AB (ref 3.9–10.3)

## 2014-06-04 MED ORDER — HEPARIN SOD (PORK) LOCK FLUSH 100 UNIT/ML IV SOLN
500.0000 [IU] | Freq: Once | INTRAVENOUS | Status: AC | PRN
  Administered 2014-06-04: 500 [IU]
  Filled 2014-06-04: qty 5

## 2014-06-04 MED ORDER — LORAZEPAM 0.5 MG PO TABS: 0.5000 mg | ORAL_TABLET | Freq: Four times a day (QID) | ORAL | Status: DC | PRN

## 2014-06-04 MED ORDER — ACETAMINOPHEN 325 MG PO TABS
650.0000 mg | ORAL_TABLET | Freq: Once | ORAL | Status: AC
  Administered 2014-06-04: 650 mg via ORAL

## 2014-06-04 MED ORDER — SODIUM CHLORIDE 0.9 % IJ SOLN
10.0000 mL | INTRAMUSCULAR | Status: DC | PRN
Start: 2014-06-04 — End: 2014-06-04
  Administered 2014-06-04: 10 mL
  Filled 2014-06-04: qty 10

## 2014-06-04 MED ORDER — DIPHENHYDRAMINE HCL 25 MG PO CAPS
25.0000 mg | ORAL_CAPSULE | Freq: Once | ORAL | Status: AC
  Administered 2014-06-04: 25 mg via ORAL

## 2014-06-04 MED ORDER — SODIUM CHLORIDE 0.9 % IJ SOLN
10.0000 mL | Freq: Once | INTRAMUSCULAR | Status: AC
  Administered 2014-06-04: 10 mL
  Filled 2014-06-04: qty 10

## 2014-06-04 MED ORDER — TRASTUZUMAB CHEMO INJECTION 440 MG
6.0000 mg/kg | Freq: Once | INTRAVENOUS | Status: AC
  Administered 2014-06-04: 504 mg via INTRAVENOUS
  Filled 2014-06-04: qty 24

## 2014-06-04 MED ORDER — DIPHENHYDRAMINE HCL 25 MG PO CAPS
ORAL_CAPSULE | ORAL | Status: AC
  Filled 2014-06-04: qty 1

## 2014-06-04 MED ORDER — SODIUM CHLORIDE 0.9 % IV SOLN
Freq: Once | INTRAVENOUS | Status: AC
  Administered 2014-06-04: 16:00:00 via INTRAVENOUS

## 2014-06-04 MED ORDER — ACETAMINOPHEN 325 MG PO TABS
ORAL_TABLET | ORAL | Status: AC
  Filled 2014-06-04: qty 2

## 2014-06-04 MED ORDER — OXYCODONE-ACETAMINOPHEN 5-325 MG PO TABS: 1.0000 | ORAL_TABLET | ORAL | Status: DC | PRN

## 2014-06-04 NOTE — Patient Instructions (Signed)
[facility] Discharge Instructions for Patients Receiving Chemotherapy  Today you received the following chemotherapy agents: Herceptin.  To help prevent nausea and vomiting after your treatment, we encourage you to take your nausea medication: Lorazepam 0.5 mg every 6 hours as needed.   If you develop nausea and vomiting that is not controlled by your nausea medication, call the clinic.   BELOW ARE SYMPTOMS THAT SHOULD BE REPORTED IMMEDIATELY:  *FEVER GREATER THAN 100.5 F  *CHILLS WITH OR WITHOUT FEVER  NAUSEA AND VOMITING THAT IS NOT CONTROLLED WITH YOUR NAUSEA MEDICATION  *UNUSUAL SHORTNESS OF BREATH  *UNUSUAL BRUISING OR BLEEDING  TENDERNESS IN MOUTH AND THROAT WITH OR WITHOUT PRESENCE OF ULCERS  *URINARY PROBLEMS  *BOWEL PROBLEMS  UNUSUAL RASH Items with * indicate a potential emergency and should be followed up as soon as possible.  Feel free to call the clinic you have any questions or concerns. The clinic phone number is [phone].

## 2014-06-04 NOTE — Telephone Encounter (Signed)
, °

## 2014-06-04 NOTE — Telephone Encounter (Signed)
Per staff message and POF I have scheduled appts. Advised scheduler of appts. JMW  

## 2014-06-04 NOTE — Progress Notes (Signed)
ID: Tina [name] OB: 09-Aug-1960  MR#: [id number]  TAV#:[id number]  PCP: [name], DO GYN:   SU: Dr. [name] OTHER MD: Dr. Haroldine Laws, Dr. [name] radiation   CHIEF COMPLAINT:Tina [name] 53 y.o. [place], [place] woman with clinical stage T2N0 IIA invasive ductal carcinoma, grade 3, ER positive, HER-2/neu positive invasive ductal carcinoma of the right breast   BREAST CANCER HISTORY:  1. Underwent screening mammogram. On the right side she was noted to have a mass with microcalcifications. Ultrasound in February 2015 showed a 1.2 x 1.1 x 1.0 cm rounded hypoechoic mass with irregular margins in the 8:00 position 4 cm from the nipple. No abnormal axillary lymph nodes were noted. She had MRI of the breasts performed that showed in the lower outer right breast 2.2 x 1.3 x 1.5 cm Irregular mass. In the upper outer left breast there was a 7 x 5 x 8 mm indeterminate enhancing mass. Also another 9 x 7 x 6 mm indeterminant enhancing mass at 9:00 in the left breast was noted.there are also noted to be ominent cortices of right axillary lymph nodes. Patient has undergone biopsy of these. On the right patient had biopsy performed of the 1.2 cm mass. The pathology revealed grade 3 invasive ductal carcinoma with ductal carcinoma in situ. Tumor was ER +70% PR +6% HER-2/neu was amplified and proliferation marker Ki-67 70%.   2. Referred to genetic counseling: testing was negative.   3.  Patient underwent a a right breast lumpectomy and sentinel node biopsy by Dr. [name] on 03/01/2014.  There was 0.6cm, grade III, residual high grade carcinoma with heterologous differentiation.  There was no lymphovascular invasion, and the invasive tumor was 49m away from the inferior margin and 174maway from the posterior margin. 3 sentinel nodes were negative for metastases.    CURRENT THERAPY: Herceptin  INTERVAL HISTORY:   FrJamarrias doing well today.  She is receiving radiation therapy and is excited about completing  therapy this upcoming Monday.  She is having difficulty with her hands bilaterally.  When she wakes up in the morning they are swollen and she cannot close them completely.  She has seen ortho about a trigger finger and was also offered an injection for her back for the pain there.  She has been busy recently because her mother has been having seizures.  She is requesting a refill of her Percocet for her back pain/trigger finger, and Lorazepam that she takes for anxiety.  She is otherwise doing well and is without questions and concerns.    REVIEW OF SYSTEMS:  A 10 point review of systems was conducted and is otherwise negative except for what is noted above.     PAST MEDICAL HISTORY: Past Medical History  Diagnosis Date  . Leukocytopenia   . Hypertension   . Degenerative joint disease   . Leukopenia 06/16/2013  . Needs flu shot 06/17/2013  . Cancer of right breast 09/03/13    Invasive Ductal Carcinoma/Ductal Carcinoma Insitu    PAST SURGICAL HISTORY: Past Surgical History  Procedure Laterality Date  . Cesarean section      x3  . Carpal tunnel release  1998    rt  . Trigger finger release  2004    rt thumb  . Portacath placement  08/2013  . Tubal ligation      FAMILY HISTORY Family History  Problem Relation Age of Onset  . Hypertension Mother   . Dementia Mother     small vessel disease  .  Cancer Mother 30    cervical  cancer  . Cancer Father 59    prostate ca  . Diabetes Father   . Arthritis Father     GYNECOLOGIC HISTORY: menarche at age 17, G25 P3, no h/o abnormal pap smears, LMP 09/2013 (on Zoladex), has h/o chlamydia at age 29. Used progesterone cream x 1 month.      SOCIAL HISTORY: Lives with husband of 3 years, Tina [name], in a 2 story house.  Drinks ETOH occasionally, has 10 pack year tobacco history, no illicit drug use.      ADVANCED DIRECTIVES: not in place   HEALTH MAINTENANCE: History  Substance Use Topics  . Smoking status: Former Smoker -- 1.00  packs/day for 15 years    Types: Cigars, Cigarettes    Quit date: 02/23/2004  . Smokeless tobacco: Never Used     Comment: remote smoking history  . Alcohol Use: 0.0 oz/week    0 Shots of liquor per week     Comment: occ     Mammogram: 08/2013 Colonoscopy: not yet Bone Density Scan: n/a Pap Smear:  06/25/2012 Eye Exam: 03/2013 Vitamin D Level:  n/a Lipid Panel: 04/2013   Allergies  Allergen Reactions  . Adhesive [Tape] Itching    Burned and scarred skinned  . Nickel Rash    Current Outpatient Prescriptions  Medication Sig Dispense Refill  . amLODipine (NORVASC) 5 MG tablet Take 1 tablet (5 mg total) by mouth daily. 30 tablet 3  . ascorbic acid (VITAMIN C) 1000 MG tablet Take 1,000 mg by mouth daily.    Marland Kitchen aspirin 81 MG tablet Take 81 mg by mouth daily.    Marland Kitchen b complex vitamins capsule Take 1 capsule by mouth daily.    Marland Kitchen BIOTIN 5000 PO Take by mouth daily.     . Calcium Carbonate-Vit D-Min (CALTRATE 600+D PLUS MINERALS PO) Take 1 tablet by mouth daily.    . Cholecalciferol (VITAMIN D3) 10000 UNITS capsule Take 10,000 Units by mouth daily.    . Coenzyme Q10 (CO Q 10 PO) Take by mouth daily.    Marland Kitchen gabapentin (NEURONTIN) 100 MG capsule Take 3 capsules (300 mg total) by mouth 3 (three) times daily. 270 capsule 1  . ibuprofen (ADVIL,MOTRIN) 800 MG tablet Take 1 tablet (800 mg total) by mouth every 8 (eight) hours as needed for mild pain. 30 tablet 0  . L-ARGININE PO Take by mouth daily.    Marland Kitchen lidocaine-prilocaine (EMLA) cream Apply topically as needed. 30 g 6  . lisinopril-hydrochlorothiazide (PRINZIDE,ZESTORETIC) 20-12.5 MG per tablet TAKE TWO TABLETS BY MOUTH ONCE DAILY 120 tablet 0  . LORazepam (ATIVAN) 0.5 MG tablet Take 1 tablet (0.5 mg total) by mouth every 6 (six) hours as needed (Nausea or vomiting). 30 tablet 0  . omeprazole (PRILOSEC) 40 MG capsule TAKE ONE CAPSULE BY MOUTH ONCE DAILY 30 capsule 2  . Probiotic Product (TRUBIOTICS) CAPS Take 1 capsule by mouth daily.    Marland Kitchen  VITAMIN E PO Take 1 capsule by mouth daily.     Marland Kitchen oxyCODONE-acetaminophen (PERCOCET/ROXICET) 5-325 MG per tablet Take 1 tablet by mouth every 4 (four) hours as needed. 15 tablet 0   No current facility-administered medications for this visit.   Facility-Administered Medications Ordered in Other Visits  Medication Dose Route Frequency Provider Last Rate Last Dose  . 0.9 %  sodium chloride infusion   Intravenous Continuous Minette Headland, NP 1,000 mL/hr at 12/26/13 0915    . prochlorperazine (COMPAZINE) injection 10 mg  10 mg Intravenous Q6H PRN Minette Headland, NP   10 mg at 12/26/13 9741    OBJECTIVE: Filed Vitals:   06/04/14 1345  BP: 121/74  Pulse: 95  Temp: 99 F (37.2 C)  Resp: 18     Body mass index is 32.27 kg/(m^2).     GENERAL: Patient is a well appearing female in no acute distress HEENT:  Sclerae anicteric.  Oropharynx clear and moist. No ulcerations or evidence of oropharyngeal candidiasis. Neck is supple.  NODES:  No cervical, supraclavicular, or axillary lymphadenopathy palpated.  BREAST EXAM: peeling to right breast laterally near axilla LUNGS:  Clear to auscultation bilaterally.  No wheezes or rhonchi. HEART:  Regular rate and rhythm. No murmur appreciated. ABDOMEN:  Soft, nontender.  Positive, normoactive bowel sounds. No organomegaly palpated. MSK:  No focal spinal tenderness to palpation. Full range of motion bilaterally in the upper extremities. EXTREMITIES:  No peripheral edema.   SKIN:  Clear with no obvious rashes or skin changes. No nail dyscrasia. NEURO:  Nonfocal. Well oriented.  Appropriate affect. ECOG FS:1 - Symptomatic but completely ambulatory  LAB RESULTS:  CMP     Component Value Date/Time   NA 141 04/23/2014 0909   NA 138 09/25/2013 1320   K 3.5 04/23/2014 0909   K 3.6* 09/25/2013 1320   CL 98 09/25/2013 1320   CL 104 06/16/2012 1303   CO2 27 04/23/2014 0909   CO2 27 09/25/2013 1320   GLUCOSE 103 04/23/2014 0909   GLUCOSE 82  09/25/2013 1320   GLUCOSE 85 06/16/2012 1303   BUN 14.1 04/23/2014 0909   BUN 9 09/25/2013 1320   CREATININE 1.0 04/23/2014 0909   CREATININE 0.82 09/25/2013 1320   CALCIUM 9.8 04/23/2014 0909   CALCIUM 10.0 09/25/2013 1320   PROT 7.7 04/23/2014 0909   PROT 8.7* 10/04/2012 1830   ALBUMIN 3.7 04/23/2014 0909   ALBUMIN 4.1 10/04/2012 1830   AST 21 04/23/2014 0909   AST 32 10/04/2012 1830   ALT 18 04/23/2014 0909   ALT 27 10/04/2012 1830   ALKPHOS 70 04/23/2014 0909   ALKPHOS 42 10/04/2012 1830   BILITOT 0.38 04/23/2014 0909   BILITOT 0.7 10/04/2012 1830   GFRNONAA 82* 09/25/2013 1320   GFRAA >90 09/25/2013 1320    I No results found for: SPEP  Lab Results  Component Value Date   WBC 2.8* 06/04/2014   NEUTROABS 1.7 06/04/2014   HGB 12.5 06/04/2014   HCT 36.0 06/04/2014   MCV 88.9 06/04/2014   PLT 237 06/04/2014      Chemistry      Component Value Date/Time   NA 141 04/23/2014 0909   NA 138 09/25/2013 1320   K 3.5 04/23/2014 0909   K 3.6* 09/25/2013 1320   CL 98 09/25/2013 1320   CL 104 06/16/2012 1303   CO2 27 04/23/2014 0909   CO2 27 09/25/2013 1320   BUN 14.1 04/23/2014 0909   BUN 9 09/25/2013 1320   CREATININE 1.0 04/23/2014 0909   CREATININE 0.82 09/25/2013 1320      Component Value Date/Time   CALCIUM 9.8 04/23/2014 0909   CALCIUM 10.0 09/25/2013 1320   ALKPHOS 70 04/23/2014 0909   ALKPHOS 42 10/04/2012 1830   AST 21 04/23/2014 0909   AST 32 10/04/2012 1830   ALT 18 04/23/2014 0909   ALT 27 10/04/2012 1830   BILITOT 0.38 04/23/2014 0909   BILITOT 0.7 10/04/2012 1830       No results found for: LABCA2  No components found for: LABCA125  No results for input(s): INR in the last 168 hours.  Urinalysis    Component Value Date/Time   LABSPEC 1.005 10/09/2013 1646   GLUCOSEU Negative 10/09/2013 1646   UROBILINOGEN 0.2 10/09/2013 1646    PATHOLOGY: 1. Breast, lumpectomy, right breast mass with radioactive seed - INVASIVE HIGH GRADE  CARCINOSARCOMA (INVASIVE MAMMARY CARCINOMA WITH MALIGNANT HETEROLOGOUS DIFFERENTIATION). SEE COMMENT. - INVASIVE MAMMARY CARCINOMA, SEE COMMENT. - NEGATIVE FOR LYMPH VASCULAR INVASION. - INVASIVE TUMOR IS 7 MM FROM THE NEAREST MARGIN (INFERIOR) - DUCTAL CARCINOMA IN SITU - IN SITU CARCINOMA IS 1 MM FROM NEAREST MARGIN (POSTERIOR). - PREVIOUS BIOPSY SITE. - SEE TUMOR SYNOPTIC TEMPLATE BELOW. 2. Lymph node, sentinel, biopsy, right axillary #1 - ONE LYMPH NODE, NEGATIVE FOR TUMOR (0/1). 1 of 4 FINAL for SAMAURI, KELLENBERGER ([id number]) Diagnosis(continued) 3. Lymph node, sentinel, biopsy, right axillary # 2 - ONE LYMPH NODE, NEGATIVE FOR TUMOR (0/1). 4. Lymph node, sentinel, biopsy, right axillary #3 - ONE LYMPH NODE, NEGATIVE FOR TUMOR (0/1). Microscopic Comment 1. BREAST, INVASIVE TUMOR, WITH LYMPH NODE SAMPLING Specimen, including laterality and lymph node sampling (sentinel, non-sentinel): Right breast with sentinel lymph node sampling. Procedure: Lumpectomy. Histologic type: High grade carcinoma with heterologous differentiation, see comment. Grade: III of III Tubule formation: 3 Nuclear pleomorphism: 3 Mitotic:3 Tumor size (gross measurement): 0.6 cm Margins: Invasive, distance to closest margin: 7 mm In-situ, distance to closest margin: 1 mm If margin positive, focally or broadly: N/A Lymphovascular invasion: Absent. Ductal carcinoma in situ: Present, pending stains Grade: II of III Extensive intraductal component: Absent. Lobular neoplasia: Absent. Tumor focality: Unifocal Treatment effect: Present. If present, treatment effect in breast tissue, lymph nodes or both: Breast tissue Extent of tumor: Skin: N/A Nipple: N/A Skeletal muscle: N/A Lymph nodes: Examined: 3 Sentinel 0 Non-sentinel 3 Total Lymph nodes with metastasis: 0 Isolated tumor cells (< 0.2 mm): N/A Micrometastasis: (> 0.2 mm and < 2.0 mm): N/A Macrometastasis: (> 2.0 mm): N/A Extracapsular extension:  N/A Breast prognostic profile: Estrogen receptor: Not repeated, previous study demonstrated 7% positivity ([id number]) Progesterone receptor: Not repeated, previous study demonstrated 6% positivity ([id number]) Her 2 neu: Repeated, previous study demonstrated amplification (3.55) ([id number]) Ki-67: Not repeated, previous study demonstrated 70% proliferation rate Non-neoplastic breast: Previous biopsy site, fibrocystic change, columnar cell/hyperplasia without atypia and microcalcifications. TNM: ypT1b, pN0, pMX Comments: The residual focus of high grade mammary carcinoma (immediately adjacent to the previous biopsy site) demonstrates malignant cartilaginous heterologous differentiation. The heterologous differentiation is confirmed with cytokeratin AE1/3 and S100 immunostains. (CRR:gt, 03/02/14) 2. , 3, and 4. These parts were reviewed with Dr. Gari Crown who concurs. 2 of 4 FINAL for JULIE-ANN, VANMAANEN ([id number]) [place] RUND DO Pathologist, Electronic Signature (Case signed 03/03/2014)  ASSESSMENT: 53 y.o. [place], [place] woman with clinical stage right breast T2N0 IIA invasive ductal carcinoma, grade 3, ER 7% PR 6%, Ki-67 70%, HER-2/neu positive.   1.  Patient was started on neoadjuvant Docetaxel, Carboplatin, Trastuzumab, Pertuzumab that started on 10/02/13. She received treatment on day 1 of a 21 day cycle with Neulasta given on day 2 for granulocyte support. The patient received 4 cycles and due to fluid retention and neuropathy the Docetaxel was discontinued.  Gemcitabine was given at 825m/m2 instead, with no other changes made to her chemotherapy regimen.  She completed her final 2 cycles with that chemotherapy regimen.  2. The patient does receive Zoladex every 28 days.    3.  Patient underwent a a right breast lumpectomy and sentinel node biopsy  by Dr. [name] on 03/01/2014.  There was 0.6cm, grade III, residual high grade carcinoma with heterologous differentiation.  There was no  lymphovascular invasion, and the invasive tumor was 40m away from the inferior margin and 142maway from the posterior margin. 3 sentinel nodes were negative for metastases.    4. Adjuvant Trastuzumab every 21 days to complete out one calendar year of anti-HER-2 therapy.  5.  Radiation therapy started 04/27/2014  6. Anti-estrogen therapy to follow radiation.    PLAN:   FrJazzlenes doing well today.  Her labs are stable and she will proceed with Herceptin today.  She is almost finished with radiation.  She will return in 3 weeks for labs, f/u with Dr. GuLindi Adiebout anti-estrogen therapy, and continuing Herceptin.    She is planning on having her trigger finger released soon, and I did clear her to have this done.  I refilled the percocet for her back pain that is managaed by ortho.  I also refilled her lorazepam.  I reviewed with her that she didn't need to take the lorazepam any longer, and that this would be the last prescription. I recommended either looking at coping mechanisms to anxiety inducing events or switching to an SSRI or SNRI.  The patient declined an anti-depressant.    Cardiac: Her last echocardiogram on 03/11/14 demonstrated a LVEF of 60-65%. She was evaluated by Dr. BeVaughan Brownern 03/11/14 in the cardio-onc clinic. He reviewed her echo, and cleared her to continue with Herceptin.  Follow up was recommended in 3 months time. This has not been scheduled and I have requested this today.    I recommended she limit her sodium intake and exercise regularly to help with the swelling.  She is also followed in the lymphedema clinic.    FrShericanows to call usKorean the interim for any questions or concerns.  We can certainly see her sooner if needed.  I spent 25 minutes counseling the patient face to face.  The total time spent in the appointment was 30 minutes.  LiMinette [id number]/12/2013 2:18 PM

## 2014-06-07 ENCOUNTER — Encounter: Payer: Self-pay | Admitting: Radiation Oncology

## 2014-06-07 ENCOUNTER — Ambulatory Visit
Admission: RE | Admit: 2014-06-07 | Discharge: 2014-06-07 | Disposition: A | Discharge status: Home / Self Care | Payer: BC Managed Care – PPO | Source: Ambulatory Visit | Attending: Radiation Oncology | Admitting: Radiation Oncology

## 2014-06-07 VITALS — BP 118/89 | HR 114 | Temp 98.4°F | Resp 12 | Wt 187.9 lb

## 2014-06-07 DIAGNOSIS — C50511 Malignant neoplasm of lower-outer quadrant of right female breast: Secondary | ICD-10-CM

## 2014-06-07 DIAGNOSIS — Z510 Encounter for antineoplastic radiation therapy: Secondary | ICD-10-CM | POA: Diagnosis not present

## 2014-06-07 MED ORDER — BIAFINE EX EMUL
CUTANEOUS | Status: DC | PRN
  Administered 2014-06-07: 12:00:00 via TOPICAL

## 2014-06-07 MED ORDER — RADIAPLEXRX EX GEL
Freq: Once | CUTANEOUS | Status: AC
  Administered 2014-06-07: 12:00:00 via TOPICAL

## 2014-06-07 NOTE — Addendum Note (Signed)
Encounter addended by: Jenene Slicker, RN on: 06/07/2014 12:11 PM<BR>     Documentation filed: [employer], Orders

## 2014-06-07 NOTE — Progress Notes (Signed)
She rates her pain as mild. Pt complains of itching, Loss of Sleep and Fatigue.  Pt right breast positive for breast tenderness, dryness and pruritus, Hyperpigmentation.  Noted right axilla has a moist opened wound. Pt reports continuing to apply Radiaplex and Neosporin with pain relief. Noted slight swelling in bilateral hands.

## 2014-06-07 NOTE — Addendum Note (Signed)
Encounter addended by: Jenene Slicker, RN on: 06/07/2014 12:13 PM<BR>     Documentation filed: Inpatient MAR

## 2014-06-07 NOTE — Progress Notes (Signed)
   Weekly Management Note  outpatient    ICD-9-CM ICD-10-CM   1. Malignant neoplasm of lower-outer quadrant of female breast, right 174.5 C50.511 topical emolient (BIAFINE) emulsion    Completed Radiotherapy. Total Dose:  60Gy   Narrative:  The patient presents for routine under treatment assessment on last day of radiotherapy.  CBCT/MVCT images/Port film x-rays were reviewed.  The chart was checked. Doing well.  Physical Findings:  weight is 187 lb 14.4 oz (85.231 kg). Her oral temperature is 98.4 F (36.9 C). Her blood pressure is 118/89 and her pulse is 114. Her respiration is 12 and oxygen saturation is 100%.   Hyperpigmented right breast/axilla with resolved moist desquamation in axilla.  Impression:  The patient has tolerated radiotherapy.  Plan:  Routine follow-up in one month. Refilled radiaplex, gave tube of biafine  ________________________________   Eppie Gibson, M.D.

## 2014-06-08 ENCOUNTER — Other Ambulatory Visit: Payer: Self-pay | Admitting: Adult Health

## 2014-06-08 ENCOUNTER — Ambulatory Visit: Payer: BC Managed Care – PPO | Admitting: Physical Therapy

## 2014-06-08 DIAGNOSIS — I890 Lymphedema, not elsewhere classified: Secondary | ICD-10-CM

## 2014-06-08 DIAGNOSIS — C50919 Malignant neoplasm of unspecified site of unspecified female breast: Secondary | ICD-10-CM

## 2014-06-08 DIAGNOSIS — Z5189 Encounter for other specified aftercare: Secondary | ICD-10-CM | POA: Diagnosis not present

## 2014-06-08 NOTE — Progress Notes (Signed)
  Radiation Oncology         [phone] ________________________________  Name: Tina [name] MRN: [id number]  Date: 06/07/2014  DOB: 16-Oct-1960  End of Treatment Note  Diagnosis:   clinical T2N0M0 Stage II Invasive Ductal Carcinoma with DCIS, Right breast, Grade III, ER 7%, PR 6%, Her2 neu +. 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  Plan: The patient has completed radiation treatment. The patient will return to radiation oncology clinic for routine followup in one month. I advised them to call or return sooner if they have any questions or concerns related to their recovery or treatment.  -----------------------------------  Eppie Gibson, MD

## 2014-06-08 NOTE — Therapy (Signed)
Physical Therapy Treatment  Patient Details  Name: Tina [name] MRN: [id number] Date of Birth: 1961/06/03  Encounter Date: 06/08/2014      PT End of Session - 06/08/14 1752    Visit Number 11   Number of Visits 20   Date for PT Re-Evaluation 07/05/14   PT Start Time (ACUTE ONLY) 1310   PT Stop Time (ACUTE ONLY) 1355   PT Time Calculation (min) (ACUTE ONLY) 45 min   Activity Tolerance Patient tolerated treatment well      Past Medical History  Diagnosis Date  . Leukocytopenia   . Hypertension   . Degenerative joint disease   . Leukopenia 06/16/2013  . Needs flu shot 06/17/2013  . Cancer of right breast 09/03/13    Invasive Ductal Carcinoma/Ductal Carcinoma Insitu    Past Surgical History  Procedure Laterality Date  . Cesarean section      x3  . Carpal tunnel release  1998    rt  . Trigger finger release  2004    rt thumb  . Portacath placement  08/2013  . Tubal ligation      There were no vitals taken for this visit.  Visit Diagnosis:  Lymphedema of breast      Subjective Assessment - 06/08/14 1338    Symptoms last radiation treatment yesterday!! wants to keep working on core   Currently in Pain? No/denies            [facility] Adult PT Treatment/Exercise - 06/08/14 0001    Neck Exercises: Seated   Cervical Rotation Both;10 reps   Shoulder Rolls Backwards;5 reps   Shoulder ABduction 5 reps;Both  with wrist extension and added neck rotation also   Lumbar Exercises: Aerobic   Elliptical level 4  for 20 minutes  nustep    Lumbar Exercises: Supine   Clam 10 reps   Bent Knee Raise 10 reps   Dead Bug 10 reps  on soft foam roller   Bridge 10 reps  also did one legged bridges   Straight Leg Raise 10 reps   Straight Leg Raises Limitations on soft foam roller           Plan - 06/08/14 1754    Clinical Impression Statement Tina [name] is improving and did well with exercise today.  She wants to increase core strength to prepare for upcoming abdominal  surgery. She reports she has less pain today and has met pain goal.    PT Next Visit Plan Pilates session for more core activation        Problem List Patient Active Problem List   Diagnosis Date Noted  . Peripheral neuropathy due to chemotherapy 03/12/2014  . Malignant neoplasm of breast (female), unspecified site 11/16/2013  . Malignant neoplasm of lower-outer quadrant of female breast 09/15/2013  . Left shoulder pain - seeing Dr. Lorin Mercy 08/20/2013  . Hypertension          Long Term Clinic Goals - 06/08/14 1757    CC Long Term Goal  #1   Title Verbalize good understanding of the maintenance phase of treatment including manual lymph drainage, use of compression and lymphedema risk reduction practicies   Time 3   Period Weeks   Status New   CC Long Term Goal  #2   Title reduces edema by 75% at right lateral breast per patient reports   Time 3   Period Weeks   Status On-going   CC Long Term Goal  #3   Title be independent with  a home exercise program   Time 3   Period Weeks   Status On-going   CC Long Term Goal  #4   Title report overall pain decreased by >/= 75 % to tolerate daily tasks with less pain   Time 3   Period Weeks   Status Achieved   CC Long Term Goal  #5   Title report a 50% decrease in fatigue   Time 3   Period Weeks   Status On-going          Norwood Levo 06/08/2014, 6:00 PM

## 2014-06-10 ENCOUNTER — Ambulatory Visit (HOSPITAL_BASED_OUTPATIENT_CLINIC_OR_DEPARTMENT_OTHER): Payer: BC Managed Care – PPO

## 2014-06-10 DIAGNOSIS — Z5111 Encounter for antineoplastic chemotherapy: Secondary | ICD-10-CM

## 2014-06-10 DIAGNOSIS — C50519 Malignant neoplasm of lower-outer quadrant of unspecified female breast: Secondary | ICD-10-CM

## 2014-06-10 DIAGNOSIS — C50511 Malignant neoplasm of lower-outer quadrant of right female breast: Secondary | ICD-10-CM

## 2014-06-10 MED ORDER — GOSERELIN ACETATE 3.6 MG ~~LOC~~ IMPL
3.6000 mg | DRUG_IMPLANT | Freq: Once | SUBCUTANEOUS | Status: AC
  Administered 2014-06-10: 3.6 mg via SUBCUTANEOUS
  Filled 2014-06-10: qty 3.6

## 2014-06-10 NOTE — Patient Instructions (Signed)
Goserelin injection What is this medicine? GOSERELIN (GOE se rel in) is similar to a hormone found in the body. It lowers the amount of sex hormones that the body makes. Men will have lower testosterone levels and women will have lower estrogen levels while taking this medicine. In men, this medicine is used to treat prostate cancer; the injection is either given once per month or once every 12 weeks. A once per month injection (only) is used to treat women with endometriosis, dysfunctional uterine bleeding, or advanced breast cancer. This medicine may be used for other purposes; ask your health care provider or pharmacist if you have questions. COMMON BRAND NAME(S): Zoladex What should I tell my health care provider before I take this medicine? They need to know if you have any of these conditions (some only apply to women): -diabetes -heart disease or previous heart attack -high blood pressure -high cholesterol -kidney disease -osteoporosis or low bone density -problems passing urine -spinal cord injury -stroke -tobacco smoker -an unusual or allergic reaction to goserelin, hormone therapy, other medicines, foods, dyes, or preservatives -pregnant or trying to get pregnant -breast-feeding How should I use this medicine? This medicine is for injection under the skin. It is given by a health care professional in a hospital or clinic setting. Men receive this injection once every 4 weeks or once every 12 weeks. Women will only receive the once every 4 weeks injection. Talk to your pediatrician regarding the use of this medicine in children. Special care may be needed. Overdosage: If you think you have taken too much of this medicine contact a poison control center or emergency room at once. NOTE: This medicine is only for you. Do not share this medicine with others. What if I miss a dose? It is important not to miss your dose. Call your doctor or health care professional if you are unable to  keep an appointment. What may interact with this medicine? -female hormones like estrogen -herbal or dietary supplements like black cohosh, chasteberry, or DHEA -female hormones like testosterone -prasterone This list may not describe all possible interactions. Give your health care provider a list of all the medicines, herbs, non-prescription drugs, or dietary supplements you use. Also tell them if you smoke, drink alcohol, or use illegal drugs. Some items may interact with your medicine. What should I watch for while using this medicine? Visit your doctor or health care professional for regular checks on your progress. Your symptoms may appear to get worse during the first weeks of this therapy. Tell your doctor or healthcare professional if your symptoms do not start to get better or if they get worse after this time. Your bones may get weaker if you take this medicine for a long time. If you smoke or frequently drink alcohol you may increase your risk of bone loss. A family history of osteoporosis, chronic use of drugs for seizures (convulsions), or corticosteroids can also increase your risk of bone loss. Talk to your doctor about how to keep your bones strong. This medicine should stop regular monthly menstration in women. Tell your doctor if you continue to menstrate. Women should not become pregnant while taking this medicine or for 12 weeks after stopping this medicine. Women should inform their doctor if they wish to become pregnant or think they might be pregnant. There is a potential for serious side effects to an unborn child. Talk to your health care professional or pharmacist for more information. Do not breast-feed an infant while taking   this medicine. Men should inform their doctors if they wish to father a child. This medicine may lower sperm counts. Talk to your health care professional or pharmacist for more information. What side effects may I notice from receiving this  medicine? Side effects that you should report to your doctor or health care professional as soon as possible: -allergic reactions like skin rash, itching or hives, swelling of the face, lips, or tongue -bone pain -breathing problems -changes in vision -chest pain -feeling faint or lightheaded, falls -fever, chills -pain, swelling, warmth in the leg -pain, tingling, numbness in the hands or feet -signs and symptoms of low blood pressure like dizziness; feeling faint or lightheaded, falls; unusually weak or tired -stomach pain -swelling of the ankles, feet, hands -trouble passing urine or change in the amount of urine -unusually high or low blood pressure -unusually weak or tired Side effects that usually do not require medical attention (report to your doctor or health care professional if they continue or are bothersome): -change in sex drive or performance -changes in breast size in both males and females -changes in emotions or moods -headache -hot flashes -irritation at site where injected -loss of appetite -skin problems like acne, dry skin -vaginal dryness This list may not describe all possible side effects. Call your doctor for medical advice about side effects. You may report side effects to FDA at 1-800-FDA-1088. Where should I keep my medicine? This drug is given in a hospital or clinic and will not be stored at home. NOTE: This sheet is a summary. It may not cover all possible information. If you have questions about this medicine, talk to your doctor, pharmacist, or health care provider.  2015, [employer]vier/Gold Standard. (2013-09-22 11:10:35)  

## 2014-06-14 ENCOUNTER — Telehealth: Payer: Self-pay | Admitting: Hematology and Oncology

## 2014-06-15 ENCOUNTER — Encounter: Payer: BC Managed Care – PPO | Admitting: Physical Therapy

## 2014-06-15 ENCOUNTER — Ambulatory Visit: Payer: BC Managed Care – PPO | Admitting: Physical Therapy

## 2014-06-25 ENCOUNTER — Ambulatory Visit (HOSPITAL_BASED_OUTPATIENT_CLINIC_OR_DEPARTMENT_OTHER): Payer: BC Managed Care – PPO

## 2014-06-25 ENCOUNTER — Telehealth: Payer: Self-pay | Admitting: *Deleted

## 2014-06-25 ENCOUNTER — Other Ambulatory Visit (HOSPITAL_BASED_OUTPATIENT_CLINIC_OR_DEPARTMENT_OTHER): Payer: BC Managed Care – PPO

## 2014-06-25 ENCOUNTER — Ambulatory Visit: Payer: BC Managed Care – PPO | Admitting: Hematology and Oncology

## 2014-06-25 ENCOUNTER — Encounter: Payer: Self-pay | Admitting: Adult Health

## 2014-06-25 ENCOUNTER — Other Ambulatory Visit: Payer: BC Managed Care – PPO

## 2014-06-25 ENCOUNTER — Ambulatory Visit (HOSPITAL_BASED_OUTPATIENT_CLINIC_OR_DEPARTMENT_OTHER): Payer: BC Managed Care – PPO | Admitting: Adult Health

## 2014-06-25 ENCOUNTER — Other Ambulatory Visit: Payer: Self-pay | Admitting: Hematology

## 2014-06-25 ENCOUNTER — Telehealth: Payer: Self-pay | Admitting: Adult Health

## 2014-06-25 VITALS — BP 127/77 | HR 105 | Temp 98.7°F | Resp 18 | Ht 64.0 in | Wt 186.8 lb

## 2014-06-25 DIAGNOSIS — M546 Pain in thoracic spine: Secondary | ICD-10-CM

## 2014-06-25 DIAGNOSIS — Z170 Estrogen receptor positive status [ER+]: Secondary | ICD-10-CM

## 2014-06-25 DIAGNOSIS — Z5112 Encounter for antineoplastic immunotherapy: Secondary | ICD-10-CM

## 2014-06-25 DIAGNOSIS — C50511 Malignant neoplasm of lower-outer quadrant of right female breast: Secondary | ICD-10-CM

## 2014-06-25 LAB — COMPREHENSIVE METABOLIC PANEL (CC13)
ALT: 23 U/L (ref 0–55)
ANION GAP: 12 meq/L — AB (ref 3–11)
AST: 27 U/L (ref 5–34)
Albumin: 4 g/dL (ref 3.5–5.0)
Alkaline Phosphatase: 68 U/L (ref 40–150)
BUN: 15.9 mg/dL (ref 7.0–26.0)
CALCIUM: 10.2 mg/dL (ref 8.4–10.4)
CO2: 27 meq/L (ref 22–29)
Chloride: 104 mEq/L (ref 98–109)
Creatinine: 1 mg/dL (ref 0.6–1.1)
Glucose: 105 mg/dl (ref 70–140)
POTASSIUM: 3.9 meq/L (ref 3.5–5.1)
SODIUM: 143 meq/L (ref 136–145)
TOTAL PROTEIN: 7.9 g/dL (ref 6.4–8.3)
Total Bilirubin: 0.71 mg/dL (ref 0.20–1.20)

## 2014-06-25 LAB — CBC WITH DIFFERENTIAL/PLATELET
BASO%: 0.9 % (ref 0.0–2.0)
Basophils Absolute: 0 10*3/uL (ref 0.0–0.1)
EOS ABS: 0 10*3/uL (ref 0.0–0.5)
EOS%: 1.6 % (ref 0.0–7.0)
HCT: 36.6 % (ref 34.8–46.6)
HGB: 12.1 g/dL (ref 11.6–15.9)
LYMPH#: 0.7 10*3/uL — AB (ref 0.9–3.3)
LYMPH%: 22 % (ref 14.0–49.7)
MCH: 30.4 pg (ref 25.1–34.0)
MCHC: 33.1 g/dL (ref 31.5–36.0)
MCV: 91.6 fL (ref 79.5–101.0)
MONO#: 0.2 10*3/uL (ref 0.1–0.9)
MONO%: 7 % (ref 0.0–14.0)
NEUT%: 68.5 % (ref 38.4–76.8)
NEUTROS ABS: 2 10*3/uL (ref 1.5–6.5)
PLATELETS: 246 10*3/uL (ref 145–400)
RBC: 4 10*6/uL (ref 3.70–5.45)
RDW: 13.4 % (ref 11.2–14.5)
WBC: 3 10*3/uL — AB (ref 3.9–10.3)

## 2014-06-25 MED ORDER — ACETAMINOPHEN 325 MG PO TABS
ORAL_TABLET | ORAL | Status: AC
  Filled 2014-06-25: qty 2

## 2014-06-25 MED ORDER — DIPHENHYDRAMINE HCL 25 MG PO CAPS
ORAL_CAPSULE | ORAL | Status: AC
  Filled 2014-06-25: qty 1

## 2014-06-25 MED ORDER — SODIUM CHLORIDE 0.9 % IV SOLN
Freq: Once | INTRAVENOUS | Status: AC
  Administered 2014-06-25: 12:00:00 via INTRAVENOUS

## 2014-06-25 MED ORDER — HEPARIN SOD (PORK) LOCK FLUSH 100 UNIT/ML IV SOLN
500.0000 [IU] | Freq: Once | INTRAVENOUS | Status: AC | PRN
  Administered 2014-06-25: 500 [IU]
  Filled 2014-06-25: qty 5

## 2014-06-25 MED ORDER — SODIUM CHLORIDE 0.9 % IJ SOLN
10.0000 mL | INTRAMUSCULAR | Status: DC | PRN
  Administered 2014-06-25: 10 mL
  Filled 2014-06-25: qty 10

## 2014-06-25 MED ORDER — TRASTUZUMAB CHEMO INJECTION 440 MG
6.0000 mg/kg | Freq: Once | INTRAVENOUS | Status: AC
  Administered 2014-06-25: 504 mg via INTRAVENOUS
  Filled 2014-06-25: qty 24

## 2014-06-25 MED ORDER — ACETAMINOPHEN 325 MG PO TABS
650.0000 mg | ORAL_TABLET | Freq: Once | ORAL | Status: AC
  Administered 2014-06-25: 650 mg via ORAL

## 2014-06-25 MED ORDER — OXYCODONE-ACETAMINOPHEN 5-325 MG PO TABS
1.0000 | ORAL_TABLET | ORAL | Status: DC | PRN
Start: 2014-06-25 — End: 2014-07-09

## 2014-06-25 MED ORDER — DIPHENHYDRAMINE HCL 25 MG PO CAPS
25.0000 mg | ORAL_CAPSULE | Freq: Once | ORAL | Status: AC
  Administered 2014-06-25: 25 mg via ORAL

## 2014-06-25 NOTE — Patient Instructions (Signed)
Anastrozole tablets What is this medicine? ANASTROZOLE (an AS troe zole) is used to treat breast cancer in women who have gone through menopause. Some types of breast cancer depend on estrogen to grow, and this medicine can stop tumor growth by blocking estrogen production. This medicine may be used for other purposes; ask your health care provider or pharmacist if you have questions. COMMON BRAND NAME(S): Arimidex What should I tell my health care provider before I take this medicine? They need to know if you have any of these conditions: -liver disease -an unusual or allergic reaction to anastrozole, other medicines, foods, dyes, or preservatives -pregnant or trying to get pregnant -breast-feeding How should I use this medicine? Take this medicine by mouth with a glass of water. Follow the directions on the prescription label. You can take this medicine with or without food. Take your doses at regular intervals. Do not take your medicine more often than directed. Do not stop taking except on the advice of your doctor or health care professional. Talk to your pediatrician regarding the use of this medicine in children. Special care may be needed. Overdosage: If you think you have taken too much of this medicine contact a poison control center or emergency room at once. NOTE: This medicine is only for you. Do not share this medicine with others. What if I miss a dose? If you miss a dose, take it as soon as you can. If it is almost time for your next dose, take only that dose. Do not take double or extra doses. What may interact with this medicine? Do not take this medicine with any of the following medications: -female hormones, like estrogens or progestins and birth control pills This medicine may also interact with the following medications: -tamoxifen This list may not describe all possible interactions. Give your health care provider a list of all the medicines, herbs, non-prescription  drugs, or dietary supplements you use. Also tell them if you smoke, drink alcohol, or use illegal drugs. Some items may interact with your medicine. What should I watch for while using this medicine? Visit your doctor or health care professional for regular checks on your progress. Let your doctor or health care professional know about any unusual vaginal bleeding. Do not treat yourself for diarrhea, nausea, vomiting or other side effects. Ask your doctor or health care professional for advice. What side effects may I notice from receiving this medicine? Side effects that you should report to your doctor or health care professional as soon as possible: -allergic reactions like skin rash, itching or hives, swelling of the face, lips, or tongue -any new or unusual symptoms -breathing problems -chest pain -leg pain or swelling -vomiting Side effects that usually do not require medical attention (report to your doctor or health care professional if they continue or are bothersome): -back or bone pain -cough, or throat infection -diarrhea or constipation -dizziness -headache -hot flashes -loss of appetite -nausea -sweating -weakness and tiredness -weight gain This list may not describe all possible side effects. Call your doctor for medical advice about side effects. You may report side effects to FDA at 1-800-FDA-1088. Where should I keep my medicine? Keep out of the reach of children. Store at room temperature between 20 and 25 degrees C (68 and 77 degrees F). Throw away any unused medicine after the expiration date. NOTE: This sheet is a summary. It may not cover all possible information. If you have questions about this medicine, talk to your doctor, pharmacist,   or health care provider.  2015, Elsevier/Gold Standard. (2007-09-26 16:31:52) Tamoxifen oral tablet What is this medicine? TAMOXIFEN (ta MOX i fen) blocks the effects of estrogen. It is commonly used to treat breast cancer. It  is also used to decrease the chance of breast cancer coming back in women who have received treatment for the disease. It may also help prevent breast cancer in women who have a high risk of developing breast cancer. This medicine may be used for other purposes; ask your health care provider or pharmacist if you have questions. COMMON BRAND NAME(S): Nolvadex What should I tell my health care provider before I take this medicine? They need to know if you have any of these conditions: -blood clots -blood disease -cataracts or impaired eyesight -endometriosis -high calcium levels -high cholesterol -irregular menstrual cycles -liver disease -stroke -uterine fibroids -an unusual or allergic reaction to tamoxifen, other medicines, foods, dyes, or preservatives -pregnant or trying to get pregnant -breast-feeding How should I use this medicine? Take this medicine by mouth with a glass of water. Follow the directions on the prescription label. You can take it with or without food. Take your medicine at regular intervals. Do not take your medicine more often than directed. Do not stop taking except on your doctor's advice. A special MedGuide will be given to you by the pharmacist with each prescription and refill. Be sure to read this information carefully each time. Talk to your pediatrician regarding the use of this medicine in children. While this drug may be prescribed for selected conditions, precautions do apply. Overdosage: If you think you have taken too much of this medicine contact a poison control center or emergency room at once. NOTE: This medicine is only for you. Do not share this medicine with others. What if I miss a dose? If you miss a dose, take it as soon as you can. If it is almost time for your next dose, take only that dose. Do not take double or extra doses. What may interact with this medicine? -aminoglutethimide -bromocriptine -chemotherapy drugs -female hormones, like  estrogens and birth control pills -letrozole -medroxyprogesterone -phenobarbital -rifampin -warfarin This list may not describe all possible interactions. Give your health care provider a list of all the medicines, herbs, non-prescription drugs, or dietary supplements you use. Also tell them if you smoke, drink alcohol, or use illegal drugs. Some items may interact with your medicine. What should I watch for while using this medicine? Visit your doctor or health care professional for regular checks on your progress. You will need regular pelvic exams, breast exams, and mammograms. If you are taking this medicine to reduce your risk of getting breast cancer, you should know that this medicine does not prevent all types of breast cancer. If breast cancer or other problems occur, there is no guarantee that it will be found at an early stage. Do not become pregnant while taking this medicine or for 2 months after stopping this medicine. Stop taking this medicine if you get pregnant or think you are pregnant and contact your doctor. This medicine may harm your unborn baby. Women who can possibly become pregnant should use birth control methods that do not use hormones during tamoxifen treatment and for 2 months after therapy has stopped. Talk with your health care provider for birth control advice. Do not breast feed while taking this medicine. What side effects may I notice from receiving this medicine? Side effects that you should report to your doctor or health care  professional as soon as possible: -changes in vision (blurred vision) -changes in your menstrual cycle -difficulty breathing or shortness of breath -difficulty walking or talking -new breast lumps -numbness -pelvic pain or pressure -redness, blistering, peeling or loosening of the skin, including inside the mouth -skin rash or itching (hives) -sudden chest pain -swelling of lips, face, or tongue -swelling, pain or tenderness in your  calf or leg -unusual bruising or bleeding -vaginal discharge that is bloody, brown, or rust -weakness -yellowing of the whites of the eyes or skin Side effects that usually do not require medical attention (report to your doctor or health care professional if they continue or are bothersome): -fatigue -hair loss, although uncommon and is usually mild -headache -hot flashes -impotence (in men) -nausea, vomiting (mild) -vaginal discharge (white or clear) This list may not describe all possible side effects. Call your doctor for medical advice about side effects. You may report side effects to FDA at 1-800-FDA-1088. Where should I keep my medicine? Keep out of the reach of children. Store at room temperature between 20 and 25 degrees C (68 and 77 degrees F). Protect from light. Keep container tightly closed. Throw away any unused medicine after the expiration date. NOTE: This sheet is a summary. It may not cover all possible information. If you have questions about this medicine, talk to your doctor, pharmacist, or health care provider.  2015, Elsevier/Gold Standard. (2008-04-01 12:01:56)

## 2014-06-25 NOTE — Telephone Encounter (Signed)
Per staff message and POF I have scheduled appts. Advised scheduler of appts. JMW  

## 2014-06-25 NOTE — Patient Instructions (Signed)

## 2014-06-25 NOTE — Progress Notes (Signed)
ID: Tina [name] OB: 1961/02/05  MR#: [id number]  DXI#:[id number]  PCP: Tina [name]., DO GYN:   SU: Dr. [name] OTHER MD: Dr. Haroldine [name], Dr. Isidore [name] radiation   CHIEF COMPLAINT:Tina [name] 53 y.o. [place], [place] woman with clinical stage T2N0 IIA invasive ductal carcinoma, grade 3, ER positive, HER-2/neu positive invasive ductal carcinoma of the right breast   BREAST CANCER HISTORY:  1. Underwent screening mammogram. On the right side she was noted to have a mass with microcalcifications. Ultrasound in February 2015 showed a 1.2 x 1.1 x 1.0 cm rounded hypoechoic mass with irregular margins in the 8:00 position 4 cm from the nipple. No abnormal axillary lymph nodes were noted. She had MRI of the breasts performed that showed in the lower outer right breast 2.2 x 1.3 x 1.5 cm Irregular mass. In the upper outer left breast there was a 7 x 5 x 8 mm indeterminate enhancing mass. Also another 9 x 7 x 6 mm indeterminant enhancing mass at 9:00 in the left breast was noted.there are also noted to be ominent cortices of right axillary lymph nodes. Patient has undergone biopsy of these. On the right patient had biopsy performed of the 1.2 cm mass. The pathology revealed grade 3 invasive ductal carcinoma with ductal carcinoma in situ. Tumor was ER +70% PR +6% HER-2/neu was amplified and proliferation marker Ki-67 70%.   2. Referred to genetic counseling: testing was negative.   3.  Patient underwent a a right breast lumpectomy and sentinel node biopsy by Dr. [name] on 03/01/2014.  There was 0.6cm, grade III, residual high grade carcinoma with heterologous differentiation.  There was no lymphovascular invasion, and the invasive tumor was 76m away from the inferior margin and 143maway from the posterior margin. 3 sentinel nodes were negative for metastases.    CURRENT THERAPY: Herceptin  INTERVAL HISTORY:   FrSyonas doing well today.  She is here for follow up prior to receiving Herceptin.  She did  complete radiation therapy and tolerated it pretty well.  She has upcoming usrgery to her left thumb trigger finger, on Monday, June 28, 2014.  She will also undergo a TAH/BSO on 07/16/14 and needs her next Herceptin appointment delayed.  She denies fevers, chills, nausea, vomiting, constipation, diarrhea, chest pain, palpitations, swelling or any further concerns.    REVIEW OF SYSTEMS:  A 10 point review of systems was conducted and is otherwise negative except for what is noted above.     PAST MEDICAL HISTORY: Past Medical History  Diagnosis Date  . Leukocytopenia   . Hypertension   . Degenerative joint disease   . Leukopenia 06/16/2013  . Needs flu shot 06/17/2013  . Cancer of right breast 09/03/13    Invasive Ductal Carcinoma/Ductal Carcinoma Insitu    PAST SURGICAL HISTORY: Past Surgical History  Procedure Laterality Date  . Cesarean section      x3  . Carpal tunnel release  1998    rt  . Trigger finger release  2004    rt thumb  . Portacath placement  08/2013  . Tubal ligation      FAMILY HISTORY Family History  Problem Relation Age of Onset  . Hypertension Mother   . Dementia Mother     small vessel disease  . Cancer Mother 30    cervical  cancer  . Cancer Father [id number]  prostate ca  . Diabetes Father   . Arthritis Father     GYNECOLOGIC HISTORY: menarche at age 53  G5 P3, no h/o abnormal pap smears, LMP 09/2013 (on Zoladex), has h/o chlamydia at age 27. Used progesterone cream x 1 month.      SOCIAL HISTORY: Lives with husband of 3 years, Tina [name], in a 2 story house.  Drinks ETOH occasionally, has 10 pack year tobacco history, no illicit drug use.      ADVANCED DIRECTIVES: not in place   HEALTH MAINTENANCE: History  Substance Use Topics  . Smoking status: Former Smoker -- 1.00 packs/day for 15 years    Types: Cigars, Cigarettes    Quit date: 02/23/2004  . Smokeless tobacco: Never Used     Comment: remote smoking history  . Alcohol Use: 0.0  oz/week    0 Shots of liquor per week     Comment: occ     Mammogram: 08/2013 Colonoscopy: not yet Bone Density Scan: n/a Pap Smear:  06/25/2012 Eye Exam: 03/2013 Vitamin D Level:  n/a Lipid Panel: 04/2013   Allergies  Allergen Reactions  . Adhesive [Tape] Itching    Burned and scarred skinned  . Nickel Rash    Current Outpatient Prescriptions  Medication Sig Dispense Refill  . amLODipine (NORVASC) 5 MG tablet Take 1 tablet (5 mg total) by mouth daily. 30 tablet 3  . ascorbic acid (VITAMIN C) 1000 MG tablet Take 1,000 mg by mouth daily.    Marland Kitchen aspirin 81 MG tablet Take 81 mg by mouth daily.    Marland Kitchen b complex vitamins capsule Take 1 capsule by mouth daily.    Marland Kitchen BIOTIN 5000 PO Take by mouth daily.     . Calcium Carbonate-Vit D-Min (CALTRATE 600+D PLUS MINERALS PO) Take 1 tablet by mouth daily.    . Cholecalciferol (VITAMIN D3) 10000 UNITS capsule Take 10,000 Units by mouth daily.    . Coenzyme Q10 (CO Q 10 PO) Take by mouth daily.    Marland Kitchen gabapentin (NEURONTIN) 100 MG capsule TAKE THREE CAPSULES BY MOUTH THREE TIMES DAILY 270 capsule 1  . ibuprofen (ADVIL,MOTRIN) 800 MG tablet Take 1 tablet (800 mg total) by mouth every 8 (eight) hours as needed for mild pain. 30 tablet 0  . L-ARGININE PO Take by mouth daily.    Marland Kitchen lidocaine-prilocaine (EMLA) cream Apply topically as needed. 30 g 6  . lisinopril-hydrochlorothiazide (PRINZIDE,ZESTORETIC) 20-12.5 MG per tablet TAKE TWO TABLETS BY MOUTH ONCE DAILY 120 tablet 0  . LORazepam (ATIVAN) 0.5 MG tablet Take 1 tablet (0.5 mg total) by mouth every 6 (six) hours as needed (Nausea or vomiting). 30 tablet 0  . omeprazole (PRILOSEC) 40 MG capsule TAKE ONE CAPSULE BY MOUTH ONCE DAILY 30 capsule 2  . oxyCODONE-acetaminophen (PERCOCET/ROXICET) 5-325 MG per tablet Take 1 tablet by mouth every 4 (four) hours as needed. 15 tablet 0  . Probiotic Product (TRUBIOTICS) CAPS Take 1 capsule by mouth daily.    Marland Kitchen VITAMIN E PO Take 1 capsule by mouth daily.      No  current facility-administered medications for this visit.   Facility-Administered Medications Ordered in Other Visits  Medication Dose Route Frequency Provider Last Rate Last Dose  . 0.9 %  sodium chloride infusion   Intravenous Continuous Minette Headland, NP 1,000 mL/hr at 12/26/13 0915    . prochlorperazine (COMPAZINE) injection 10 mg  10 mg Intravenous Q6H PRN Minette Headland, NP   10 mg at 12/26/13 0917    OBJECTIVE: Filed Vitals:   06/25/14 1044  BP: 127/77  Pulse: 105  Temp: 98.7 F (37.1 C)  Resp: 18  Body mass index is 32.05 kg/(m^2).     GENERAL: Patient is a well appearing female in no acute distress HEENT:  Sclerae anicteric.  Oropharynx clear and moist. No ulcerations or evidence of oropharyngeal candidiasis. Neck is supple.  NODES:  No cervical, supraclavicular, or axillary lymphadenopathy palpated.  BREAST EXAM: peeling to right breast laterally near axilla LUNGS:  Clear to auscultation bilaterally.  No wheezes or rhonchi. HEART:  Regular rate and rhythm. No murmur appreciated. ABDOMEN:  Soft, nontender.  Positive, normoactive bowel sounds. No organomegaly palpated. MSK:  No focal spinal tenderness to palpation. Full range of motion bilaterally in the upper extremities. EXTREMITIES:  No peripheral edema.   SKIN:  Clear with no obvious rashes or skin changes. No nail dyscrasia. NEURO:  Nonfocal. Well oriented.  Appropriate affect. ECOG FS:1 - Symptomatic but completely ambulatory  LAB RESULTS:  CMP     Component Value Date/Time   NA 140 06/04/2014 1315   NA 138 09/25/2013 1320   K 3.5 06/04/2014 1315   K 3.6* 09/25/2013 1320   CL 98 09/25/2013 1320   CL 104 06/16/2012 1303   CO2 27 06/04/2014 1315   CO2 27 09/25/2013 1320   GLUCOSE 99 06/04/2014 1315   GLUCOSE 82 09/25/2013 1320   GLUCOSE 85 06/16/2012 1303   BUN 13.0 06/04/2014 1315   BUN 9 09/25/2013 1320   CREATININE 0.8 06/04/2014 1315   CREATININE 0.82 09/25/2013 1320   CALCIUM 9.9  06/04/2014 1315   CALCIUM 10.0 09/25/2013 1320   PROT 8.0 06/04/2014 1315   PROT 8.7* 10/04/2012 1830   ALBUMIN 4.0 06/04/2014 1315   ALBUMIN 4.1 10/04/2012 1830   AST 21 06/04/2014 1315   AST 32 10/04/2012 1830   ALT 24 06/04/2014 1315   ALT 27 10/04/2012 1830   ALKPHOS 62 06/04/2014 1315   ALKPHOS 42 10/04/2012 1830   BILITOT 0.63 06/04/2014 1315   BILITOT 0.7 10/04/2012 1830   GFRNONAA 82* 09/25/2013 1320   GFRAA >90 09/25/2013 1320    I No results found for: SPEP  Lab Results  Component Value Date   WBC 3.0* 06/25/2014   NEUTROABS 2.0 06/25/2014   HGB 12.1 06/25/2014   HCT 36.6 06/25/2014   MCV 91.6 06/25/2014   PLT 246 06/25/2014      Chemistry      Component Value Date/Time   NA 140 06/04/2014 1315   NA 138 09/25/2013 1320   K 3.5 06/04/2014 1315   K 3.6* 09/25/2013 1320   CL 98 09/25/2013 1320   CL 104 06/16/2012 1303   CO2 27 06/04/2014 1315   CO2 27 09/25/2013 1320   BUN 13.0 06/04/2014 1315   BUN 9 09/25/2013 1320   CREATININE 0.8 06/04/2014 1315   CREATININE 0.82 09/25/2013 1320      Component Value Date/Time   CALCIUM 9.9 06/04/2014 1315   CALCIUM 10.0 09/25/2013 1320   ALKPHOS 62 06/04/2014 1315   ALKPHOS 42 10/04/2012 1830   AST 21 06/04/2014 1315   AST 32 10/04/2012 1830   ALT 24 06/04/2014 1315   ALT 27 10/04/2012 1830   BILITOT 0.63 06/04/2014 1315   BILITOT 0.7 10/04/2012 1830       No results found for: LABCA2  No components found for: LABCA125  No results for input(s): INR in the last 168 hours.  Urinalysis    Component Value Date/Time   LABSPEC 1.005 10/09/2013 1646   GLUCOSEU Negative 10/09/2013 1646   UROBILINOGEN 0.2 10/09/2013 1646  PATHOLOGY: 1. Breast, lumpectomy, right breast mass with radioactive seed - INVASIVE HIGH GRADE CARCINOSARCOMA (INVASIVE MAMMARY CARCINOMA WITH MALIGNANT HETEROLOGOUS DIFFERENTIATION). SEE COMMENT. - INVASIVE MAMMARY CARCINOMA, SEE COMMENT. - NEGATIVE FOR LYMPH VASCULAR  INVASION. - INVASIVE TUMOR IS 7 MM FROM THE NEAREST MARGIN (INFERIOR) - DUCTAL CARCINOMA IN SITU - IN SITU CARCINOMA IS 1 MM FROM NEAREST MARGIN (POSTERIOR). - PREVIOUS BIOPSY SITE. - SEE TUMOR SYNOPTIC TEMPLATE BELOW. 2. Lymph node, sentinel, biopsy, right axillary #1 - ONE LYMPH NODE, NEGATIVE FOR TUMOR (0/1). 1 of 4 FINAL for TIFFINE, HENIGAN ([id number]) Diagnosis(continued) 3. Lymph node, sentinel, biopsy, right axillary # 2 - ONE LYMPH NODE, NEGATIVE FOR TUMOR (0/1). 4. Lymph node, sentinel, biopsy, right axillary #3 - ONE LYMPH NODE, NEGATIVE FOR TUMOR (0/1). Microscopic Comment 1. BREAST, INVASIVE TUMOR, WITH LYMPH NODE SAMPLING Specimen, including laterality and lymph node sampling (sentinel, non-sentinel): Right breast with sentinel lymph node sampling. Procedure: Lumpectomy. Histologic type: High grade carcinoma with heterologous differentiation, see comment. Grade: III of III Tubule formation: 3 Nuclear pleomorphism: 3 Mitotic:3 Tumor size (gross measurement): 0.6 cm Margins: Invasive, distance to closest margin: 7 mm In-situ, distance to closest margin: 1 mm If margin positive, focally or broadly: N/A Lymphovascular invasion: Absent. Ductal carcinoma in situ: Present, pending stains Grade: II of III Extensive intraductal component: Absent. Lobular neoplasia: Absent. Tumor focality: Unifocal Treatment effect: Present. If present, treatment effect in breast tissue, lymph nodes or both: Breast tissue Extent of tumor: Skin: N/A Nipple: N/A Skeletal muscle: N/A Lymph nodes: Examined: 3 Sentinel 0 Non-sentinel 3 Total Lymph nodes with metastasis: 0 Isolated tumor cells (< 0.2 mm): N/A Micrometastasis: (> 0.2 mm and < 2.0 mm): N/A Macrometastasis: (> 2.0 mm): N/A Extracapsular extension: N/A Breast prognostic profile: Estrogen receptor: Not repeated, previous study demonstrated 7% positivity ([id number]) Progesterone receptor: Not repeated, previous study  demonstrated 6% positivity ([id number]) Her 2 neu: Repeated, previous study demonstrated amplification (3.55) ([id number]) Ki-67: Not repeated, previous study demonstrated 70% proliferation rate Non-neoplastic breast: Previous biopsy site, fibrocystic change, columnar cell/hyperplasia without atypia and microcalcifications. TNM: ypT1b, pN0, pMX Comments: The residual focus of high grade mammary carcinoma (immediately adjacent to the previous biopsy site) demonstrates malignant cartilaginous heterologous differentiation. The heterologous differentiation is confirmed with cytokeratin AE1/3 and S100 immunostains. (CRR:gt, 03/02/14) 2. , 3, and 4. These parts were reviewed with Dr. Gari Crown who concurs. 2 of 4 FINAL for MARLIA, SCHEWE ([id number]) [place] RUND DO Pathologist, Electronic Signature (Case signed 03/03/2014)  ASSESSMENT: 53 y.o. [place], [place] woman with clinical stage right breast T2N0 IIA invasive ductal carcinoma, grade 3, ER 7% PR 6%, Ki-67 70%, HER-2/neu positive.   1.  Patient was started on neoadjuvant Docetaxel, Carboplatin, Trastuzumab, Pertuzumab that started on 10/02/13. She received treatment on day 1 of a 21 day cycle with Neulasta given on day 2 for granulocyte support. The patient received 4 cycles and due to fluid retention and neuropathy the Docetaxel was discontinued.  Gemcitabine was given at 875m/m2 instead, with no other changes made to her chemotherapy regimen.  She completed her final 2 cycles with that chemotherapy regimen.  2. The patient does receive Zoladex every 28 days.    3.  Patient underwent a a right breast lumpectomy and sentinel node biopsy by Dr. WDonne Hazelon 03/01/2014.  There was 0.6cm, grade III, residual high grade carcinoma with heterologous differentiation.  There was no lymphovascular invasion, and the invasive tumor was 75maway from the inferior margin and 62m82mway from the posterior margin. 3 sentinel nodes  were negative for metastases.    4.  Adjuvant Trastuzumab every 21 days to complete out one calendar year of anti-HER-2 therapy.  5.  Radiation therapy from 9/29/2015through 06/07/2014  6. Anti-estrogen therapy to follow radiation.    PLAN:   Teffany is doing well today.  She will proceed with Herceptin.  I reviewed her labs with her in detail.  They are normal.  She does have an upcoming echocardiogram and f/u with the cardio-onc clinic on 07/16/14.  She has no signs of heart failure on today's visit.    I reviewed two different types of anti-estrogen therapy she can start, Arimidex and Tamoxifen.  We discussed each in detail.  I gave her detailed information on each in her AVS.  She will return on 07/28/14 for labs, follow up with Dr. Lindi Adie to start either Arimidex or Tamoxifen and Herceptin.  I did not start Arimidex or Tamoxifen due to her two upcoming surgeries.    Lilo knows to call us in the interim for any questions or concerns.  We can certainly see her sooner if needed.  I spent 25 minutes counseling the patient face to face.  The total time spent in the appointment was 30 minutes.  Minette Headland, [place] [phone] 06/25/2014 10:55 AM

## 2014-06-28 HISTORY — PX: OTHER SURGICAL HISTORY: SHX169

## 2014-06-29 ENCOUNTER — Encounter: Payer: BC Managed Care – PPO | Admitting: Physical Therapy

## 2014-07-02 ENCOUNTER — Other Ambulatory Visit (HOSPITAL_COMMUNITY): Payer: Self-pay | Admitting: Cardiology

## 2014-07-02 DIAGNOSIS — C50419 Malignant neoplasm of upper-outer quadrant of unspecified female breast: Secondary | ICD-10-CM

## 2014-07-05 ENCOUNTER — Other Ambulatory Visit: Payer: Self-pay | Admitting: Obstetrics and Gynecology

## 2014-07-05 NOTE — Progress Notes (Signed)
Photon Boost Complex Emergency planning/management officer Note  Diagnosis: Breast Cancer  The patient's CT images from her simulation were reviewed to plan her boost treatment to her right breast  lumpectomy cavity.  The boost to the lumpectomy cavity will be delivered with 3 photon fields using MLCs for custom blocks again heart and lungs, with 6 and 10 MV photon energy.  This constitutes 3 complex treatment devices. Isodose plan was reviewed and approved. 10 Gy in 5 fractions prescribed.  -----------------------------------  Eppie Gibson, MD

## 2014-07-06 ENCOUNTER — Encounter (HOSPITAL_COMMUNITY): Payer: Self-pay

## 2014-07-06 ENCOUNTER — Encounter: Payer: Self-pay | Admitting: Radiation Oncology

## 2014-07-06 ENCOUNTER — Encounter (HOSPITAL_COMMUNITY)
Admission: RE | Admit: 2014-07-06 | Discharge: 2014-07-06 | Disposition: A | Discharge status: Home / Self Care | Payer: BC Managed Care – PPO | Source: Ambulatory Visit | Attending: Obstetrics and Gynecology | Admitting: Obstetrics and Gynecology

## 2014-07-06 ENCOUNTER — Ambulatory Visit (HOSPITAL_BASED_OUTPATIENT_CLINIC_OR_DEPARTMENT_OTHER)
Admission: RE | Admit: 2014-07-06 | Discharge: 2014-07-06 | Disposition: A | Discharge status: Home / Self Care | Payer: BC Managed Care – PPO | Source: Ambulatory Visit | Attending: Internal Medicine | Admitting: Internal Medicine

## 2014-07-06 ENCOUNTER — Ambulatory Visit (HOSPITAL_COMMUNITY)
Admission: RE | Admit: 2014-07-06 | Discharge: 2014-07-06 | Disposition: A | Discharge status: Home / Self Care | Payer: BC Managed Care – PPO | Source: Ambulatory Visit | Attending: Family Medicine | Admitting: Family Medicine

## 2014-07-06 VITALS — BP 104/78 | HR 100 | Wt 186.0 lb

## 2014-07-06 DIAGNOSIS — I10 Essential (primary) hypertension: Secondary | ICD-10-CM | POA: Diagnosis not present

## 2014-07-06 DIAGNOSIS — C50911 Malignant neoplasm of unspecified site of right female breast: Secondary | ICD-10-CM | POA: Insufficient documentation

## 2014-07-06 DIAGNOSIS — C50919 Malignant neoplasm of unspecified site of unspecified female breast: Secondary | ICD-10-CM

## 2014-07-06 DIAGNOSIS — C50419 Malignant neoplasm of upper-outer quadrant of unspecified female breast: Secondary | ICD-10-CM

## 2014-07-06 HISTORY — DX: Anxiety disorder, unspecified: F41.9

## 2014-07-06 HISTORY — DX: Gastro-esophageal reflux disease without esophagitis: K21.9

## 2014-07-06 HISTORY — DX: Myoneural disorder, unspecified: G70.9

## 2014-07-06 NOTE — Patient Instructions (Addendum)
   Your procedure is scheduled on:  Friday, Dec 11  Enter through the [employer] of [facility] at: 11:15 AM Pick up the phone at the desk and dial [phone] and inform us of your arrival.  Please call this number if you have any problems the morning of surgery: [phone]  Remember: Do not eat food after midnight: Thursday Do not drink clear liquids after: 6 AM Friday, day of surgery Take these medicines the morning of surgery with a SIP OF WATER:   Do not wear jewelry, make-up, or FINGER nail polish No metal in your hair or on your body. Do not wear lotions, powders, perfumes.  You may wear deodorant.  Do not bring valuables to the hospital. Contacts, dentures or bridgework may not be worn into surgery.    Patients discharged on the day of surgery will not be allowed to drive home.  Home with   Leave suitcase in the car. After Surgery it may be brought to your room. For patients being admitted to the hospital, checkout time is 11:00am the day of discharge.

## 2014-07-06 NOTE — Progress Notes (Signed)
Echo Lab  2D Echocardiogram completed.  [place], RDCS 07/06/2014 2:35 PM

## 2014-07-06 NOTE — Progress Notes (Signed)
Patient ID: Tina [name], female   DOB: January 06, 1961, 53 y.o.   MRN: [id number]  HPI:  Tina [name] is a 53 y/o woman with HTN, DJD and R breast cancer referred by Dr. Humphrey Rolls for enrollment into the cardio-oncology clinic.   Denies any h/o known cardiac issues.  Found to have R breast CA. Stage II(T2 NX) invasive ductal carcinoma with DCIS of the right breast. Biopsy revealed tumor to be ER+PR+ HER-2/neu positive with proliferation marker Ki-67 70%   Started neoadjuvant chemotherapy on 10/06/13 with TCH/P. Now getting Herceptin/perjeta  Doing great. 9 months into Herceptin. Feels good. No edema. No CP or SOB. BP well controlled.   Echo (3/15): EF 65% Lateral s' 12.9 cm/s GLS not measured  Echo (5/15): EF 60-65%, lateral S' 12.9 cm/sec, GLS -19.9%.  Echo (5/15): EF 60-65%, lateral S'  cm/sec, GLS -19.3%.  Echo (07/06/14): EF 60-65%, lateral S' 11.4  cm/sec, GLS -21.3%.   Review of Systems: All systems reviewed and negative except as per HPI.   Past Medical History  Diagnosis Date  . Leukocytopenia   . Hypertension   . Leukopenia 06/16/2013  . Needs flu shot 06/17/2013    resolved - flu shot recvd in 04/2014  . Cancer of right breast 09/03/13    Invasive Ductal Carcinoma/Ductal Carcinoma Insitu  biopsies  . Anxiety   . GERD (gastroesophageal reflux disease)   . Degenerative joint disease     back neck and shoulder  . 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 S/P radiation therapy 04/27/2014-06/07/2014    1) Right breast, 50 Gy in 25 fractions/ 2) Right breast boost, 10 Gy in 5 fractions    Current Outpatient Prescriptions  Medication Sig Dispense Refill  . amLODipine (NORVASC) 5 MG tablet Take 1 tablet (5 mg total) by mouth daily. 30 tablet 3  . aspirin 81 MG tablet Take 81 mg by mouth daily.    Marland Kitchen b complex vitamins capsule Take 1 capsule by mouth daily.    Marland Kitchen BIOTIN 5000 PO Take 1 capsule by mouth daily.     . Calcium Carbonate-Vit D-Min (CALTRATE 600+D PLUS  MINERALS PO) Take 1 tablet by mouth daily.    . Cholecalciferol (VITAMIN D3) 10000 UNITS capsule Take 10,000 Units by mouth daily.    . Coenzyme Q10 (CO Q 10 PO) Take 1 tablet by mouth daily.     Marland Kitchen gabapentin (NEURONTIN) 100 MG capsule TAKE THREE CAPSULES BY MOUTH THREE TIMES DAILY 270 capsule 1  . ibuprofen (ADVIL,MOTRIN) 800 MG tablet Take 1 tablet (800 mg total) by mouth every 8 (eight) hours as needed for mild pain. 30 tablet 0  . L-ARGININE PO Take 1 tablet by mouth daily.     Marland Kitchen lidocaine-prilocaine (EMLA) cream Apply topically as needed. (Patient taking differently: Apply topically as needed. Uses when she gets treatment) 30 g 6  . lisinopril-hydrochlorothiazide (PRINZIDE,ZESTORETIC) 20-12.5 MG per tablet TAKE TWO TABLETS BY MOUTH ONCE DAILY 120 tablet 0  . LORazepam (ATIVAN) 0.5 MG tablet Take 1 tablet (0.5 mg total) by mouth every 6 (six) hours as needed (Nausea or vomiting). (Patient taking differently: Take 0.5 mg by mouth every 6 (six) hours as needed for anxiety. ) 30 tablet 0  . omeprazole (PRILOSEC) 40 MG capsule TAKE ONE CAPSULE BY MOUTH ONCE DAILY 30 capsule 2  . oxyCODONE-acetaminophen (PERCOCET/ROXICET) 5-325 MG per tablet Take 1 tablet by mouth every 4 (four) hours as needed. 30 tablet 0  . Probiotic Product (  TRUBIOTICS) CAPS Take 1 capsule by mouth daily.    . Trastuzumab (HERCEPTIN IV) Inject into the vein.     Marland Kitchen VITAMIN E PO Take 1 capsule by mouth daily.      No current facility-administered medications for this encounter.   Facility-Administered Medications Ordered in Other Encounters  Medication Dose Route Frequency Provider Last Rate Last Dose  . 0.9 %  sodium chloride infusion   Intravenous Continuous Minette Headland, NP 1,000 mL/hr at 12/26/13 0915    . prochlorperazine (COMPAZINE) injection 10 mg  10 mg Intravenous Q6H PRN Minette Headland, NP   10 mg at 12/26/13 6394    Allergies  Allergen Reactions  . Adhesive [Tape] Itching    Burned and scarred skinned   . Nickel Rash    History   Social History  . Marital Status: Married    Spouse Name: N/A    Number of Children: 1  . Years of Education: N/A   Occupational History  .      homemaker   Social History Main Topics  . Smoking status: Former Smoker -- 1.00 packs/day for 15 years    Types: Cigars, Cigarettes    Quit date: 02/23/2004  . Smokeless tobacco: Never Used     Comment: remote smoking history  . Alcohol Use: 0.0 oz/week    0 Shots of liquor per week     Comment: occ   . Drug Use: Yes     Comment: Marjuana  . Sexual Activity: Yes   Other Topics Concern  . 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 Hypertension Mother   . Dementia Mother     small vessel disease  . Cancer Mother 30    cervical  cancer  . Cancer Father 35    prostate ca  . Diabetes Father   . Arthritis Father     PHYSICAL EXAM: Filed Vitals:   07/06/14 1427  BP: 104/78  Pulse: 100  Weight: 186 lb (84.369 kg)  SpO2: 97%    General:  Well appearing. No respiratory difficulty HEENT: normal Neck: supple. no JVD. Carotids 2+ bilat; no bruits. No lymphadenopathy or thryomegaly appreciated. Cor: PMI nondisplaced. Regular rate & rhythm. No rubs, gallops 1/6 SEM RUSB Lungs: clear Abdomen: soft, nontender, nondistended. No hepatosplenomegaly. No bruits or masses. Good bowel sounds. Extremities: no cyanosis, clubbing, rash, edema Neuro: alert & oriented x 3, cranial nerves grossly intact. moves all 4 extremities w/o difficulty. Affect pleasant.  ASSESSMENT & PLAN: 1. Breast CA, right.  I reviewed today's echo.  EF, lateral S', and global longitudinal strain were all stable.  She may continue Herceptin with repeat echo  in 3 months with clinic followup.  2. HTN - well controlled  Will see back in 3 months with her post Herceptin echo   Glori Bickers MD 07/06/2014

## 2014-07-06 NOTE — Patient Instructions (Addendum)
   Your procedure is scheduled on: Friday, Dec 11  Enter through the [employer] of [facility] at: 11:15 AM Pick up the phone at the desk and dial [phone] and inform us of your arrival.  Please call this number if you have any problems the morning of surgery: [phone]  Remember: Do not eat food after midnight: Thursday Do not drink clear liquids after: 6 AM Friday, day of surgery Take these medicines the morning of surgery with a SIP OF WATER:  Ativan, amlodipine, gabapentin, lisinopril-hctz, prilosec  Do not wear jewelry, make-up, or FINGER nail polish No metal in your hair or on your body. Do not wear lotions, powders, perfumes.  You may wear deodorant.  Do not bring valuables to the hospital. Contacts, dentures or bridgework may not be worn into surgery.   Patients discharged on the day of surgery will not be allowed to drive home.  Home with husband Tina [name] cell [phone] or sister Tina [name] cell [phone].  For patients being admitted to the hospital, checkout time is 11:00am the day of discharge.

## 2014-07-06 NOTE — Patient Instructions (Signed)
Doing great  For follow up in 3 months with echo

## 2014-07-07 ENCOUNTER — Encounter: Payer: Self-pay | Admitting: Family Medicine

## 2014-07-07 ENCOUNTER — Ambulatory Visit (INDEPENDENT_AMBULATORY_CARE_PROVIDER_SITE_OTHER): Payer: BC Managed Care – PPO | Admitting: Family Medicine

## 2014-07-07 VITALS — BP 102/78 | HR 85 | Temp 98.2°F | Ht 64.0 in | Wt 185.5 lb

## 2014-07-07 DIAGNOSIS — C50919 Malignant neoplasm of unspecified site of unspecified female breast: Secondary | ICD-10-CM

## 2014-07-07 DIAGNOSIS — I10 Essential (primary) hypertension: Secondary | ICD-10-CM

## 2014-07-07 DIAGNOSIS — G5600 Carpal tunnel syndrome, unspecified upper limb: Secondary | ICD-10-CM

## 2014-07-07 DIAGNOSIS — R0683 Snoring: Secondary | ICD-10-CM

## 2014-07-07 LAB — LIPID PANEL
CHOLESTEROL: 242 mg/dL — AB (ref 0–200)
HDL: 48.7 mg/dL (ref >39.00)
NonHDL: 193.3
Total CHOL/HDL Ratio: 5
Triglycerides: 236 mg/dL — ABNORMAL HIGH (ref 0.0–149.0)
VLDL: 47.2 mg/dL — ABNORMAL HIGH (ref 0.0–40.0)

## 2014-07-07 LAB — LDL CHOLESTEROL, DIRECT: Direct LDL: 146.2 mg/dL

## 2014-07-07 NOTE — Progress Notes (Signed)
Pre visit review using our clinic review tool, if applicable. No additional management support is needed unless otherwise documented below in the visit note. 

## 2014-07-07 NOTE — Progress Notes (Signed)
HPI:   HTN: -lisinopril-hctz40-25, norvasc 5 mg added in sept and she was to f/u in 2 week  -reports:she was doing great so did not follow up -denies: CP, SOB, DOE, swelling, palpitations -just had labs with cancer center and CMP ok - has not done lipids  ? OSA: -snores at night and sometimes -husband says stops breathing at night sometimes -feels like she rests pretty well, without daytime somnolence -wants sleep study -on pain meds for trigger finge surgery and wants to do sleep study off of these  Hx CTS L: -worse since cancer tx -symptoms primarily at night with radial hand finger pain and numbness, mild -not dropping things -hx R CTS surgery remotely  GERD: -on prilosec 40 mg daily -denies: dysphagia, symptoms despite tx  Breast Ca: -s/p surgery, chemo and radiation -followed by onc and plan for Herceptin and anti-estrogen therapy, followed by cardio-onc clinic as well  Had trigger finger release surgery.   ROS: See pertinent positives and negatives per HPI.  Past Medical History  Diagnosis Date  . Leukocytopenia   . Hypertension   . Leukopenia 06/16/2013  . Needs flu shot 06/17/2013    resolved - flu shot recvd in 04/2014  . Cancer of right breast 09/03/13    Invasive Ductal Carcinoma/Ductal Carcinoma Insitu  biopsies  . Anxiety   . GERD (gastroesophageal reflux disease)   . Degenerative joint disease     back neck and shoulder  . 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 S/P radiation therapy 04/27/2014-06/07/2014    1) Right breast, 50 Gy in 25 fractions/ 2) Right breast boost, 10 Gy in 5 fractions    Past Surgical History  Procedure Laterality Date  . Cesarean section      x 3  . Carpal tunnel release  1998    rt  . Trigger finger release  2004    rt thumb  . Portacath placement  08/2013    still currently active   . Tubal ligation    . Trigger finger release left thumb  06/28/14    at orthopedic surgical center  .  Wisdom tooth extraction    . Breast surgery  03/01/2014    hx right breast cancer- 3 lymph nodes removed and lumpectomy    Family History  Problem Relation Age of Onset  . Hypertension Mother   . Dementia Mother     small vessel disease  . Cancer Mother 30    cervical  cancer  . Cancer Father 93    prostate ca  . Diabetes Father   . Arthritis Father     History   Social History  . Marital Status: Married    Spouse Name: N/A    Number of Children: 1  . Years of Education: N/A   Occupational History  .      homemaker   Social History Main Topics  . Smoking status: Former Smoker -- 1.00 packs/day for 15 years    Types: Cigars, Cigarettes    Quit date: 02/23/2004  . Smokeless tobacco: Never Used     Comment: remote smoking history  . Alcohol Use: 0.0 oz/week    0 Shots of liquor per week     Comment: occ   . Drug Use: Yes     Comment: Marjuana  . Sexual Activity: Yes   Other Topics Concern  . 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 , Disp: , Rfl: ;  Calcium Carbonate-Vit D-Min (CALTRATE 600+D PLUS MINERALS PO), Take 1 tablet by mouth daily., Disp: , Rfl:  Cholecalciferol (VITAMIN D3) 10000 UNITS capsule, Take 10,000 Units by mouth daily., Disp: , Rfl: ;  Coenzyme Q10 (CO Q 10 PO), Take 1 tablet by mouth daily. , Disp: , Rfl: ;  diphenhydrAMINE (SOMINEX) 25 MG tablet, Take 25 mg by mouth at bedtime as needed for sleep., Disp: ,  Rfl: ;  gabapentin (NEURONTIN) 100 MG capsule, TAKE THREE CAPSULES BY MOUTH THREE TIMES DAILY, Disp: 270 capsule, Rfl: 1 ibuprofen (ADVIL,MOTRIN) 800 MG tablet, Take 1 tablet (800 mg total) by mouth every 8 (eight) hours as needed for mild pain., Disp: 30 tablet, Rfl: 0;  L-ARGININE PO, Take 1 tablet by mouth daily. , Disp: , Rfl: ;  lidocaine-prilocaine (EMLA) cream, Apply topically as needed. (Patient taking differently: Apply topically as needed. Uses when she gets treatment), Disp: 30 g, Rfl: 6 lisinopril-hydrochlorothiazide (PRINZIDE,ZESTORETIC) 20-12.5 MG per tablet, TAKE TWO TABLETS BY MOUTH ONCE DAILY, Disp: 120 tablet, Rfl: 0;  loratadine (CLARITIN) 10 MG tablet, Take 10 mg by mouth daily., Disp: , Rfl:  LORazepam (ATIVAN) 0.5 MG tablet, Take 1 tablet (0.5 mg total) by mouth every 6 (six) hours as needed (Nausea or vomiting). (Patient taking differently: Take 0.5 mg by mouth every 6 (six) hours as needed for anxiety. ), Disp: 30 tablet, Rfl: 0;  omeprazole (PRILOSEC) 40 MG capsule, TAKE ONE CAPSULE BY MOUTH ONCE DAILY, Disp: 30 capsule, Rfl: 2 oxyCODONE-acetaminophen (PERCOCET/ROXICET) 5-325 MG per tablet, Take 1 tablet by mouth every 4 (four) hours as needed., Disp: 30 tablet, Rfl: 0;  Probiotic Product (TRUBIOTICS) CAPS, Take 1 capsule by mouth daily., Disp: , Rfl: ;  Trastuzumab (HERCEPTIN IV), Inject into the vein. , Disp: , Rfl: ;  VITAMIN E PO, Take 1 capsule by mouth daily. , Disp: , Rfl:  No current facility-administered medications for this visit. Facility-Administered Medications Ordered in Other Visits: 0.9 %  sodium chloride infusion, , Intravenous, Continuous, Minette Headland, NP, Last Rate: 1,000 mL/hr at 12/26/13 0915;  prochlorperazine (COMPAZINE) injection 10 mg, 10 mg, Intravenous, Q6H PRN, Minette Headland, NP, 10 mg at 12/26/13 0917  EXAM:  Filed Vitals:   07/07/14 1017  BP: 102/78  Pulse: 85  Temp: 98.2 F (36.8 C)    Body mass index is 31.83 kg/(m^2).  GENERAL:  vitals reviewed and listed above, alert, oriented, appears well hydrated and in no acute distress  HEENT: atraumatic, conjunttiva clear, no obvious abnormalities on inspection of external nose and ears  NECK: no obvious masses on inspection  LUNGS: clear to auscultation bilaterally, no wheezes, rales or rhonchi, good air movement  CV: HRRR, no peripheral edema  MS: moves all extremities without noticeable abnormality, normal inspection of hands and arms, normal sensation to light touch  and strength in hands, finger and forearm, + tinel's L and + phalens  PSYCH: pleasant and cooperative, no obvious depression or anxiety  ASSESSMENT AND PLAN:  Discussed the following assessment and plan:  Essential hypertension - Plan: Lipid Panel  Snoring  Carpal tunnel syndrome, unspecified laterality  Breast cancer, unspecified laterality  -reviewed recent labs -advised needs lipids - FASTING today -BP good -trial cock up brace for likely CTS - brace fitted and applied -she will call when wants to do sleep study -Patient advised to return or notify a doctor immediately if symptoms worsen or persist or new concerns arise.  Patient Instructions  BEFORE YOU LEAVE: Schedule follow up in about 2 months  -We have ordered labs or studies at this visit. It can take up to 1-2 weeks for results and processing. We will contact you with instructions IF your results are abnormal. Normal results will be released to your [facility]. If you have not heard from [place] or can not find your results in [facility] in 2 weeks please contact our office.  -PLEASE SIGN UP FOR MYCHART TODAY   We recommend the following healthy lifestyle measures: - eat a healthy diet consisting of lots of vegetables, fruits, beans, nuts, seeds, healthy meats such as white chicken and fish and whole grains.  - avoid fried foods, fast food, processed foods, sodas, red meet and other fattening foods.  - get a least 150 minutes of aerobic  exercise per week.   Wear cock up brace on wrist at night as shown      KIM, [employer].

## 2014-07-07 NOTE — Patient Instructions (Signed)
BEFORE YOU LEAVE: Schedule follow up in about 2 months  -We have ordered labs or studies at this visit. It can take up to 1-2 weeks for results and processing. We will contact you with instructions IF your results are abnormal. Normal results will be released to your [facility]. If you have not heard from [place] or can not find your results in [place] in 2 weeks please contact our office.  -PLEASE SIGN UP FOR MYCHART TODAY   We recommend the following healthy lifestyle measures: - eat a healthy diet consisting of lots of vegetables, fruits, beans, nuts, seeds, healthy meats such as white chicken and fish and whole grains.  - avoid fried foods, fast food, processed foods, sodas, red meet and other fattening foods.  - get a least 150 minutes of aerobic exercise per week.   Wear cock up brace on wrist at night as shown

## 2014-07-09 ENCOUNTER — Ambulatory Visit (HOSPITAL_COMMUNITY)
Admission: RE | Admit: 2014-07-09 | Discharge: 2014-07-09 | Disposition: A | Discharge status: Home / Self Care | Payer: BC Managed Care – PPO | Source: Ambulatory Visit | Attending: Obstetrics and Gynecology | Admitting: Obstetrics and Gynecology

## 2014-07-09 ENCOUNTER — Ambulatory Visit
Admission: RE | Admit: 2014-07-09 | Payer: BC Managed Care – PPO | Source: Ambulatory Visit | Admitting: Radiation Oncology

## 2014-07-09 ENCOUNTER — Ambulatory Visit (HOSPITAL_COMMUNITY): Payer: BC Managed Care – PPO | Admitting: Anesthesiology

## 2014-07-09 ENCOUNTER — Encounter (HOSPITAL_COMMUNITY)
Admission: RE | Disposition: A | Discharge status: Home / Self Care | Payer: Self-pay | Source: Ambulatory Visit | Attending: Obstetrics and Gynecology

## 2014-07-09 ENCOUNTER — Ambulatory Visit: Payer: BC Managed Care – PPO | Admitting: Family Medicine

## 2014-07-09 ENCOUNTER — Encounter (HOSPITAL_COMMUNITY): Payer: Self-pay | Admitting: *Deleted

## 2014-07-09 DIAGNOSIS — Z853 Personal history of malignant neoplasm of breast: Secondary | ICD-10-CM | POA: Insufficient documentation

## 2014-07-09 DIAGNOSIS — M546 Pain in thoracic spine: Secondary | ICD-10-CM

## 2014-07-09 DIAGNOSIS — C50911 Malignant neoplasm of unspecified site of right female breast: Secondary | ICD-10-CM

## 2014-07-09 DIAGNOSIS — I10 Essential (primary) hypertension: Secondary | ICD-10-CM

## 2014-07-09 HISTORY — DX: Personal history of irradiation: Z92.3

## 2014-07-09 HISTORY — PX: ROBOTIC ASSISTED BILATERAL SALPINGO OOPHERECTOMY: SHX6078

## 2014-07-09 SURGERY — ROBOTIC ASSISTED BILATERAL SALPINGO OOPHORECTOMY
Anesthesia: General | Site: Abdomen | Laterality: Bilateral

## 2014-07-09 MED ORDER — KETOROLAC TROMETHAMINE 30 MG/ML IJ SOLN
INTRAMUSCULAR | Status: AC
  Filled 2014-07-09: qty 2

## 2014-07-09 MED ORDER — FENTANYL CITRATE 0.05 MG/ML IJ SOLN
INTRAMUSCULAR | Status: AC
  Filled 2014-07-09: qty 2

## 2014-07-09 MED ORDER — ONDANSETRON HCL 4 MG/2ML IJ SOLN
INTRAMUSCULAR | Status: DC | PRN
  Administered 2014-07-09: 4 mg via INTRAVENOUS

## 2014-07-09 MED ORDER — FENTANYL CITRATE 0.05 MG/ML IJ SOLN
INTRAMUSCULAR | Status: AC
  Filled 2014-07-09: qty 5

## 2014-07-09 MED ORDER — PROPOFOL 10 MG/ML IV BOLUS
INTRAVENOUS | Status: DC | PRN
Start: 2014-07-09 — End: 2014-07-09
  Administered 2014-07-09: 200 mg via INTRAVENOUS

## 2014-07-09 MED ORDER — NEOSTIGMINE METHYLSULFATE 10 MG/10ML IV SOLN
INTRAVENOUS | Status: AC
  Filled 2014-07-09: qty 1

## 2014-07-09 MED ORDER — PHENYLEPHRINE 40 MCG/ML (10ML) SYRINGE FOR IV PUSH (FOR BLOOD PRESSURE SUPPORT)
PREFILLED_SYRINGE | INTRAVENOUS | Status: AC
  Filled 2014-07-09: qty 5

## 2014-07-09 MED ORDER — KETOROLAC TROMETHAMINE 30 MG/ML IJ SOLN
INTRAMUSCULAR | Status: DC | PRN
  Administered 2014-07-09 (×2): 30 mg via INTRAVENOUS

## 2014-07-09 MED ORDER — SCOPOLAMINE 1 MG/3DAYS TD PT72
1.0000 | MEDICATED_PATCH | Freq: Once | TRANSDERMAL | Status: DC
  Administered 2014-07-09: 1.5 mg via TRANSDERMAL

## 2014-07-09 MED ORDER — NEOSTIGMINE METHYLSULFATE 10 MG/10ML IV SOLN
INTRAVENOUS | Status: DC | PRN
  Administered 2014-07-09: 4 mg via INTRAVENOUS

## 2014-07-09 MED ORDER — IBUPROFEN 800 MG PO TABS: 800.0000 mg | ORAL_TABLET | Freq: Three times a day (TID) | ORAL | Status: DC | PRN

## 2014-07-09 MED ORDER — BUPIVACAINE HCL (PF) 0.25 % IJ SOLN
INTRAMUSCULAR | Status: DC | PRN
  Administered 2014-07-09: 9 mL

## 2014-07-09 MED ORDER — ROCURONIUM BROMIDE 100 MG/10ML IV SOLN
INTRAVENOUS | Status: AC
  Filled 2014-07-09: qty 1

## 2014-07-09 MED ORDER — ACETAMINOPHEN 10 MG/ML IV SOLN
1000.0000 mg | Freq: Once | INTRAVENOUS | Status: DC
  Filled 2014-07-09: qty 100

## 2014-07-09 MED ORDER — PROPOFOL 10 MG/ML IV EMUL
INTRAVENOUS | Status: AC
  Filled 2014-07-09: qty 20

## 2014-07-09 MED ORDER — ARTIFICIAL TEARS OP OINT
TOPICAL_OINTMENT | OPHTHALMIC | Status: DC | PRN
  Administered 2014-07-09: 1 via OPHTHALMIC

## 2014-07-09 MED ORDER — FENTANYL CITRATE 0.05 MG/ML IJ SOLN
INTRAMUSCULAR | Status: AC
  Administered 2014-07-09: 50 ug via INTRAVENOUS
  Filled 2014-07-09: qty 2

## 2014-07-09 MED ORDER — EPHEDRINE 5 MG/ML INJ
INTRAVENOUS | Status: AC
  Filled 2014-07-09: qty 10

## 2014-07-09 MED ORDER — PROMETHAZINE HCL 25 MG/ML IJ SOLN
12.5000 mg | Freq: Four times a day (QID) | INTRAMUSCULAR | Status: DC | PRN
  Administered 2014-07-09: 12.5 mg via INTRAVENOUS

## 2014-07-09 MED ORDER — PROMETHAZINE HCL 25 MG/ML IJ SOLN
INTRAMUSCULAR | Status: AC
  Administered 2014-07-09: 12.5 mg via INTRAVENOUS
  Filled 2014-07-09: qty 1

## 2014-07-09 MED ORDER — ROCURONIUM BROMIDE 100 MG/10ML IV SOLN
INTRAVENOUS | Status: DC | PRN
  Administered 2014-07-09: 10 mg via INTRAVENOUS
  Administered 2014-07-09: 40 mg via INTRAVENOUS

## 2014-07-09 MED ORDER — HYDROMORPHONE HCL 1 MG/ML IJ SOLN: 0.2500 mg | INTRAMUSCULAR | Status: DC | PRN

## 2014-07-09 MED ORDER — FENTANYL CITRATE 0.05 MG/ML IJ SOLN
INTRAMUSCULAR | Status: DC | PRN
  Administered 2014-07-09 (×2): 50 ug via INTRAVENOUS
  Administered 2014-07-09: 100 ug via INTRAVENOUS
  Administered 2014-07-09: 50 ug via INTRAVENOUS

## 2014-07-09 MED ORDER — GLYCOPYRROLATE 0.2 MG/ML IJ SOLN
INTRAMUSCULAR | Status: AC
  Filled 2014-07-09: qty 2

## 2014-07-09 MED ORDER — FENTANYL CITRATE 0.05 MG/ML IJ SOLN
25.0000 ug | INTRAMUSCULAR | Status: DC | PRN
  Administered 2014-07-09: 50 ug via INTRAVENOUS

## 2014-07-09 MED ORDER — LACTATED RINGERS IV SOLN
INTRAVENOUS | Status: DC
Start: 2014-07-09 — End: 2014-07-09
  Administered 2014-07-09 (×4): via INTRAVENOUS

## 2014-07-09 MED ORDER — PHENYLEPHRINE HCL 10 MG/ML IJ SOLN
INTRAMUSCULAR | Status: DC | PRN
  Administered 2014-07-09: 120 ug via INTRAVENOUS
  Administered 2014-07-09 (×3): 80 ug via INTRAVENOUS

## 2014-07-09 MED ORDER — BUPIVACAINE HCL (PF) 0.25 % IJ SOLN
INTRAMUSCULAR | Status: AC
  Filled 2014-07-09: qty 30

## 2014-07-09 MED ORDER — MIDAZOLAM HCL 2 MG/2ML IJ SOLN
INTRAMUSCULAR | Status: DC | PRN
  Administered 2014-07-09: 2 mg via INTRAVENOUS

## 2014-07-09 MED ORDER — EPHEDRINE SULFATE 50 MG/ML IJ SOLN
INTRAMUSCULAR | Status: DC | PRN
  Administered 2014-07-09: 10 mg via INTRAVENOUS

## 2014-07-09 MED ORDER — OXYCODONE-ACETAMINOPHEN 5-325 MG PO TABS
1.0000 | ORAL_TABLET | ORAL | Status: DC | PRN
  Administered 2014-07-09: 1 via ORAL

## 2014-07-09 MED ORDER — OXYCODONE-ACETAMINOPHEN 5-325 MG PO TABS
ORAL_TABLET | ORAL | Status: AC
  Administered 2014-07-09: 1 via ORAL
  Filled 2014-07-09: qty 1

## 2014-07-09 MED ORDER — LIDOCAINE HCL (CARDIAC) 20 MG/ML IV SOLN
INTRAVENOUS | Status: AC
  Filled 2014-07-09: qty 5

## 2014-07-09 MED ORDER — LIDOCAINE HCL (CARDIAC) 20 MG/ML IV SOLN
INTRAVENOUS | Status: DC | PRN
  Administered 2014-07-09: 60 mg via INTRAVENOUS

## 2014-07-09 MED ORDER — DEXAMETHASONE SODIUM PHOSPHATE 10 MG/ML IJ SOLN
INTRAMUSCULAR | Status: AC
  Filled 2014-07-09: qty 1

## 2014-07-09 MED ORDER — DEXAMETHASONE SODIUM PHOSPHATE 10 MG/ML IJ SOLN
INTRAMUSCULAR | Status: DC | PRN
  Administered 2014-07-09: 4 mg via INTRAVENOUS

## 2014-07-09 MED ORDER — GLYCOPYRROLATE 0.2 MG/ML IJ SOLN
INTRAMUSCULAR | Status: DC | PRN
  Administered 2014-07-09: 0.4 mg via INTRAVENOUS

## 2014-07-09 MED ORDER — MIDAZOLAM HCL 2 MG/2ML IJ SOLN
INTRAMUSCULAR | Status: AC
  Filled 2014-07-09: qty 2

## 2014-07-09 MED ORDER — OXYCODONE-ACETAMINOPHEN 5-325 MG PO TABS: 1.0000 | ORAL_TABLET | ORAL | Status: DC | PRN

## 2014-07-09 MED ORDER — LACTATED RINGERS IR SOLN
Status: DC | PRN
  Administered 2014-07-09: 3000 mL

## 2014-07-09 MED ORDER — SCOPOLAMINE 1 MG/3DAYS TD PT72
MEDICATED_PATCH | TRANSDERMAL | Status: AC
  Filled 2014-07-09: qty 1

## 2014-07-09 SURGICAL SUPPLY — 54 items
BAG SPEC RTRVL LRG 6X4 10 (ENDOMECHANICALS) ×2
BARRIER ADHS 3X4 INTERCEED (GAUZE/BANDAGES/DRESSINGS) ×2 IMPLANT
BRR ADH 4X3 ABS CNTRL BYND (GAUZE/BANDAGES/DRESSINGS) ×1
CHLORAPREP W/TINT 26ML (MISCELLANEOUS) ×2 IMPLANT
CONT PATH 16OZ SNAP LID 3702 (MISCELLANEOUS) ×2 IMPLANT
COVER BACK TABLE 60X90IN (DRAPES) ×4 IMPLANT
COVER TIP SHEARS 8 DVNC (MISCELLANEOUS) ×1 IMPLANT
COVER TIP SHEARS 8MM DA VINCI (MISCELLANEOUS) ×1
DECANTER SPIKE VIAL GLASS SM (MISCELLANEOUS) ×2 IMPLANT
DRAPE WARM FLUID 44X44 (DRAPE) ×2 IMPLANT
DRSG COVADERM PLUS 2X2 (GAUZE/BANDAGES/DRESSINGS) ×8 IMPLANT
DRSG OPSITE POSTOP 3X4 (GAUZE/BANDAGES/DRESSINGS) ×2 IMPLANT
ELECT REM PT RETURN 9FT ADLT (ELECTROSURGICAL) ×2
ELECTRODE REM PT RTRN 9FT ADLT (ELECTROSURGICAL) ×1 IMPLANT
EVACUATOR SMOKE 8.L (FILTER) ×2 IMPLANT
GAUZE VASELINE 3X9 (GAUZE/BANDAGES/DRESSINGS) IMPLANT
GLOVE BIOGEL PI IND STRL 7.0 (GLOVE) ×2 IMPLANT
GLOVE BIOGEL PI INDICATOR 7.0 (GLOVE) ×2
GLOVE ECLIPSE 6.5 STRL STRAW (GLOVE) ×2 IMPLANT
GOWN STRL REUS W/TWL LRG LVL3 (GOWN DISPOSABLE) ×16 IMPLANT
KIT ACCESSORY DA VINCI DISP (KITS) ×1
KIT ACCESSORY DVNC DISP (KITS) ×1 IMPLANT
LEGGING LITHOTOMY PAIR STRL (DRAPES) ×2 IMPLANT
LIQUID BAND (GAUZE/BANDAGES/DRESSINGS) ×2 IMPLANT
NEEDLE INSUFFLATION 120MM (ENDOMECHANICALS) ×2 IMPLANT
NS IRRIG 1000ML POUR BTL (IV SOLUTION) ×6 IMPLANT
OCCLUDER COLPOPNEUMO (BALLOONS) IMPLANT
PACK ROBOT WH (CUSTOM PROCEDURE TRAY) ×2 IMPLANT
PAD PREP 24X48 CUFFED NSTRL (MISCELLANEOUS) ×4 IMPLANT
PAD TRENDELENBURG POSITION (MISCELLANEOUS) ×2 IMPLANT
PENCIL BUTTON HOLSTER BLD 10FT (ELECTRODE) ×1 IMPLANT
POUCH SPECIMEN RETRIEVAL 10MM (ENDOMECHANICALS) ×2 IMPLANT
PROTECTOR NERVE ULNAR (MISCELLANEOUS) ×4 IMPLANT
SCISSORS LAP 5X35 DISP (ENDOMECHANICALS) ×1 IMPLANT
SET CYSTO W/LG BORE CLAMP LF (SET/KITS/TRAYS/PACK) IMPLANT
SET IRRIG TUBING LAPAROSCOPIC (IRRIGATION / IRRIGATOR) ×2 IMPLANT
SUT VIC AB 0 CT1 27 (SUTURE) ×10
SUT VIC AB 0 CT1 27XBRD ANTBC (SUTURE) ×5 IMPLANT
SUT VICRYL 0 UR6 27IN ABS (SUTURE) ×2 IMPLANT
SUT VICRYL 4-0 PS2 18IN ABS (SUTURE) ×4 IMPLANT
SYR 50ML LL SCALE MARK (SYRINGE) ×2 IMPLANT
SYSTEM CONVERTIBLE TROCAR (TROCAR) IMPLANT
TIP UTERINE 5.1X6CM LAV DISP (MISCELLANEOUS) IMPLANT
TIP UTERINE 6.7X10CM GRN DISP (MISCELLANEOUS) IMPLANT
TIP UTERINE 6.7X6CM WHT DISP (MISCELLANEOUS) IMPLANT
TIP UTERINE 6.7X8CM BLUE DISP (MISCELLANEOUS) IMPLANT
TOWEL OR 17X24 6PK STRL BLUE (TOWEL DISPOSABLE) ×6 IMPLANT
TRAY FOLEY BAG SILVER LF 14FR (CATHETERS) ×2 IMPLANT
TROCAR 12M 150ML BLUNT (TROCAR) IMPLANT
TROCAR DISP BLADELESS 8 DVNC (TROCAR) ×1 IMPLANT
TROCAR DISP BLADELESS 8MM (TROCAR) ×1
TROCAR OPTI TIP 12M 100M (ENDOMECHANICALS) ×3 IMPLANT
TROCAR Z-THREAD 12X150 (TROCAR) ×2 IMPLANT
WARMER LAPAROSCOPE (MISCELLANEOUS) ×2 IMPLANT

## 2014-07-09 NOTE — Transfer of Care (Signed)
Immediate Anesthesia Transfer of Care Note  Patient: Tina [name]  Procedure(s) Performed: Procedure(s): ROBOTIC ASSISTED BILATERAL SALPINGO OOPHORECTOM; :LYSIS OF ADHESIONS (Bilateral)  Patient Location: PACU  Anesthesia Type:General  Level of Consciousness: awake, alert , oriented and patient cooperative  Airway & Oxygen Therapy: Patient connected to nasal cannula oxygen  Post-op Assessment: Report given to PACU RN, Post -op Vital signs reviewed and stable and Patient moving all extremities X 4  Post vital signs: Reviewed and stable  Complications: Pt states right shoulder pain located in her back.  States it is "achey".  Able to lift and move arm

## 2014-07-09 NOTE — Discharge Instructions (Signed)
DISCHARGE INSTRUCTIONS: Laparoscopy  The following instructions have been prepared to help you care for yourself upon your return home today.  No Ibuprofen containing products (Advil, Aleve, Motrin, etc.) until after 9:30 pm tonight.    Wound care:  Do not get the incisions wet for the first 24 hours. The incisions should be kept clean and dry.  The dressing at your umbilical area may be removed 48 hours after surgery.  Should the incision become sore, red, and swollen after the first week, check with your doctor.  Personal hygiene:  Shower the day after your procedure.  Activity and limitations:  Do NOT drive or operate any equipment today.  Do NOT lift anything more than 15 pounds for 2-3 weeks after surgery.  Do NOT rest in bed all day.  Walking is encouraged. Walk each day, starting slowly with 5-minute walks 3 or 4 times a day. Slowly increase the length of your walks.  Walk up and down stairs slowly.  Do NOT do strenuous activities, such as golfing, playing tennis, bowling, running, biking, weight lifting, gardening, mowing, or vacuuming for 2-4 weeks. Ask your doctor when it is okay to start.  Diet: Eat a light meal as desired this evening. You may resume your usual diet tomorrow.  Return to work: This is dependent on the type of work you do. For the most part you can return to a desk job within a week of surgery. If you are more active at work, please discuss this with your doctor.  What to expect after your surgery: You may have a slight burning sensation when you urinate on the first day. You may have a very small amount of blood in the urine. Expect to have a small amount of vaginal discharge/light bleeding for 1-2 weeks. It is not unusual to have abdominal soreness and bruising for up to 2 weeks. You may be tired and need more rest for about 1 week. You may experience shoulder pain for 24-72 hours. Lying flat in bed may relieve it.  Call your doctor for any of the  following:  Develop a fever of 100.4 or greater  Inability to urinate 6 hours after discharge from hospital  Severe pain not relieved by pain medications  Persistent of heavy bleeding at incision site  Redness or swelling around incision site after a week  Increasing nausea or vomiting  Patient Signature________________________________________ Nurse Signature_________________________________________

## 2014-07-09 NOTE — Anesthesia Postprocedure Evaluation (Signed)
  Anesthesia Post-op Note  Patient: Tina [name]  Procedure(s) Performed: Procedure(s): ROBOTIC ASSISTED BILATERAL SALPINGO OOPHORECTOM; :LYSIS OF ADHESIONS (Bilateral)  Patient is awake and responsive. Pain and nausea are reasonably well controlled. Vital signs are stable and clinically acceptable. Oxygen saturation is clinically acceptable. There are no apparent anesthetic complications at this time. Patient is ready for discharge.

## 2014-07-09 NOTE — Addendum Note (Signed)
Addendum  created 07/09/14 1854 by Hubbard Robinson, CRNA   Modules edited: Charges VN

## 2014-07-09 NOTE — Anesthesia Preprocedure Evaluation (Signed)
Anesthesia Evaluation  Patient identified by MRN, date of birth, ID band Patient awake    Reviewed: Allergy & Precautions, H&P , NPO status , Patient's Chart, lab work & pertinent test results, reviewed documented beta blocker date and time   History of Anesthesia Complications Negative for: history of anesthetic complications  Airway Mallampati: I  TM Distance: >3 FB Neck ROM: full    Dental  (+) Teeth Intact   Pulmonary neg pulmonary ROS, former smoker,  breath sounds clear to auscultation  Pulmonary exam normal       Cardiovascular hypertension, On Medications Rhythm:regular Rate:Normal     Neuro/Psych  Neuromuscular disease (chemo-related neuropathy hands and feet ) negative psych ROS   GI/Hepatic Neg liver ROS, Bowel prep,GERD-  Medicated,  Endo/Other  negative endocrine ROS  Renal/GU negative Renal ROS     Musculoskeletal  (+) Arthritis - (DDD in neck, pain in left shoulder, carpal tunnel left hand),   Abdominal   Peds  Hematology Breast CA - dx 2/15 - radiation, chemo, lumpectomy   Anesthesia Other Findings   Reproductive/Obstetrics negative OB ROS                             Anesthesia Physical Anesthesia Plan  ASA: II  Anesthesia Plan: General ETT   Post-op Pain Management:    Induction:   Airway Management Planned:   Additional Equipment:   Intra-op Plan:   Post-operative Plan:   Informed Consent: I have reviewed the patients History and Physical, chart, labs and discussed the procedure including the risks, benefits and alternatives for the proposed anesthesia with the patient or authorized representative who has indicated his/her understanding and acceptance.   Dental Advisory Given  Plan Discussed with: CRNA and Surgeon  Anesthesia Plan Comments:         Anesthesia Quick Evaluation

## 2014-07-09 NOTE — Brief Op Note (Signed)
07/09/2014  4:19 PM  PATIENT:  Tina [name]  53 y.o. female  PRE-OPERATIVE DIAGNOSIS:  Breast Cancer, Previous Cesarean Section x 3  POST-OPERATIVE DIAGNOSIS:  breast cancer,previous cesarean section X3  PROCEDURE:  Procedure(s): ROBOTIC ASSISTED BILATERAL SALPINGO OOPHORECTOM; :LYSIS OF ADHESIONS (Bilateral)  SURGEON:  Surgeon(s) and Role:    * Tami Blass A Challis Crill, MD - Primary  PHYSICIAN ASSISTANT:   ASSISTANTS: Laury Deep, CNM   ANESTHESIA:   general FINDINGS;  Abdominopelvic wall adhesions, nl liver edge, nl left ovary, right ovary with cyst( mucinous), peritubal adhesions, left bowel adhesions, nl ureters, sl enlarged uterus, nl appendix EBL:  Total I/O In: 1900 [I.V.:1900] Out: 420 [Urine:400; Blood:20]  BLOOD ADMINISTERED:none  DRAINS: none   LOCAL MEDICATIONS USED:  MARCAINE     SPECIMEN:  Source of Specimen:  right tube and ovary, left tube and ovary  DISPOSITION OF SPECIMEN:  PATHOLOGY  COUNTS:  YES  TOURNIQUET:  * No tourniquets in log *  DICTATION: .Other Dictation: Dictation Number [id number]  PLAN OF CARE: Discharge to home after PACU  PATIENT DISPOSITION:  PACU - hemodynamically stable.   Delay start of Pharmacological VTE agent (>24hrs) due to surgical blood loss or risk of bleeding: no

## 2014-07-10 NOTE — Op Note (Signed)
NAMEAZRA, [name]              ACCOUNT NO.:  [id number]  MEDICAL RECORD NO.:  [id number]  LOCATION:  [facility]                          FACILITY:  [facility]  PHYSICIAN:  Servando Salina, M.D.DATE OF BIRTH:  May 07, 1961  DATE OF PROCEDURE:  07/09/2014 DATE OF DISCHARGE:  07/09/2014                              OPERATIVE REPORT   PREOPERATIVE DIAGNOSIS:  Personal history of right breast cancer, triple positive tumor markers, previous cesarean section x3.  POSTOPERATIVE DIAGNOSIS:  Abdominopelvic adhesions, personal history of breast cancer, triple positive tumor markers, previous cesarean section x3.  PROCEDURES:  Da Vinci robotic bilateral salpingo-oophorectomy, extensive lysis of adhesions.  ANESTHESIA:  General.  SURGEON:  Servando Salina, M.D.  ASSISTANT:  Laury Deep, CNM.  INDICATIONS:  This is a 53 year old, gravida 3, para 3, married black female status post right breast cancer with associated lumpectomy, chemotherapy and radiation, with triple positive tumor markers, who now presents for surgical removal of both tubes and ovaries.  The patient's history is notable for 3 previous cesarean section.  The risk of that to the procedure was explained and consent was signed.  The patient was transferred to the operating room.  DESCRIPTION OF PROCEDURE:  Under adequate general anesthesia, the patient was placed in a dorsal lithotomy position and positioned for robotic surgery.  Examination under anesthesia revealed anteverted uterus.  No adnexal masses could be palpated.  The patient was sterilely prepped and draped in usual fashion.  Indwelling Foley catheter was sterilely placed.  Bivalve speculum was placed in the vagina.  Single- tooth tenaculum was placed on the anterior lip of the cervix.  The cervix was then grasped with the single-tooth tenaculum and Acorn cannula was introduced into the uterine cavity and attached to the tenaculum for manipulation of the uterus.   The bivalve speculum was removed.  Attention was then turned to the abdomen.  Supraumbilical incision was then made after 0.25% Marcaine was injected.  Veress needle was introduced.  Opening pressure of 5 was noted.  3 L of CO2 was insufflated.  Veress needle was then removed.  A 12 mm disposable trocar with sleeve was introduced into the abdominal cavity without incident. A lighted robotic port camera was inserted, entered into the abdomen without incident.  However, omental adhesion in the mid lower third of the anterior abdominal wall was encountered.  Normal liver edge was noted.  The gallbladder was seen.  Gone around the adhesions.  The left ovary could not be seen.  The uterus appeared normal.  The right ovary was sitting up on the fundal region on the right and appeared to be enlarged with a cyst.  The additional robotic port sites were then placed, 2 on the left and 1 on the right; and a 12 mm assistant port was placed in the right lower quadrant.  After this was done, the robot was docked to the patient's left side.  In arm #1, monopolar scissors; arm #2, PK dissector; and arm #3, the Prograsp.  I then went to the surgical console.  At the surgical console with assistant using a retractor, the omental adhesions were then carefully lysed off the anterior wall and facilitating the surgery.  The uterus  was otherwise mobile.  There was no endometriosis in the anterior and posterior cul-de-sac.  There were some adhesions of the right fallopian tube to the right sidewall.  On the left, there also had adhesions of the left fallopian tube and the adnexa to the left sidewall as well.  The bowel adhesions noted on the left pelvic sidewall  The ureters were seen peristalsing bilaterally.  Starting on the left side, the adhesions from the left pelvic sidewall was removed.  The left retroperitoneal space was then opened.  A window was placed underneath the IP ligament and isolation of the  ovarian vessels was done.  The cauterization of the vessels, isolated vessels was done at the pelvic brim and then cut proximally.  The left utero-ovarian ligament was serially clamped, cauterized, and then cut.  The peritoneum underneath the left adnexa was then clamped, cauterized, and then cut.  It carried up to the proximal utero-ovarian ligament, which had been cut.  This resulted subsequently in serially clamped, cauterized, and cutting the peritoneum until the left tube and ovary were removed.  Small bleeding along the peritoneal edges carefully cauterized.  Attention was then turned to the opposite side.  Again, there were some adhesions at the proximal portion of the round ligament which were lysed.  The adhesion of the fallopian tube on the sidewall was also removed.  The ureters were again identified. Retroperitoneal space on the right was opened.  The ovarian vessels on the right were isolated at the pelvic brim cauterized, clamped, and then cut.  Proximally, the fallopian tube was a little bit higher away from the right utero-ovarian ligament, this was separately clamped, cauterized, and then cut followed by the utero-ovarian ligament on the right which was also serially clamped, cauterized, and then cut.  The dissection was then carried underneath the right mesosalpinx as well as the meso-ovarian ligament, peritoneum, and carried until the right tube and ovary were then removed.  At that point, small bleeding where the proximal vessels had been cauterized, were then re-cauterized taking care to avoid the ureter.  Both sides slightly irrigated and then suctioned.  Good hemostasis was achieved.  At that point, the robotic instruments were removed.  The robot was undocked.  I then went back to the patient's bedside at which time, the Endobag was placed through the assistant port.  The left tube and ovary were then brought out after initial attempt at removing it was  unsuccessful.  This was delivered with removal of the proximal portion of the port.  A 2nd Endobag was then placed; and once the specimen of the right tube and ovary were placed in the bag, carefully the bag remained opened, and the specimen was cut in pieces at which time, mucinous fluid from the cyst was noted. Once the specimen was cut in 2, the bag was then closed and the bag was brought up to the field.  The port was removed.  The bag was opened and the specimen was carefully removed as well in pieces and Endobag in total.  Once this was done, the abdomen was again inspected.  Good hemostasis was noted.  The port sites were then removed under direct visualization.  The fascia at the supraumbilical in the right lower quadrant site was identified and closed with 0 Vicryl figure-of-eight sutures, in the right lower quadrant site figure-of-eight suture x2 was placed.  The skin incisions were then all closed with 4-0 Vicryl subcuticular sutures and the uterine manipulator and tenaculum were  then removed.  Specimen was sent separately as right fallopian tube and ovary and left fallopian tube and ovary.  Estimated blood loss was about 20 mL.  Complication was none. Intraoperative fluid: 1900 ml. Urine output: 400 ml. The patient tolerated the procedure well and was transferred to recovery room in stable condition.     Servando Salina, M.D.     [place]/MEDQ  D:  07/09/2014  T:  07/10/2014  Job:  [id number]

## 2014-07-12 ENCOUNTER — Encounter (HOSPITAL_COMMUNITY): Payer: Self-pay | Admitting: Obstetrics and Gynecology

## 2014-07-12 NOTE — Anesthesia Procedure Notes (Signed)
Procedure Name: Intubation Date/Time: 07/09/2014 1:19 PM Performed by: [employer], Sheron Nightingale Pre-anesthesia Checklist: Patient identified Patient Re-evaluated:Patient Re-evaluated prior to inductionOxygen Delivery Method: Circle system utilized Preoxygenation: Pre-oxygenation with 100% oxygen Intubation Type: IV induction Ventilation: Mask ventilation without difficulty Laryngoscope Size: Mac and 2 Grade View: Grade I Tube type: Oral Number of attempts: 1 Placement Confirmation: ETT inserted through vocal cords under direct vision and positive ETCO2 Secured at: 22 cm Dental Injury: Teeth and Oropharynx as per pre-operative assessment

## 2014-07-12 NOTE — Addendum Note (Signed)
Addendum  created 07/12/14 1028 by Genevie Ann, CRNA   Modules edited: Anesthesia Blocks and Procedures, Anesthesia Flowsheet, Anesthesia LDA, Clinical Notes, Lines/Drains/Airways Properties Editor   Clinical Notes:  File: [id number]   Lines/Drains/Airways Properties Editor:  Properties of line/drain/airway/wound Airway 7 mm have been modified.

## 2014-07-13 ENCOUNTER — Other Ambulatory Visit: Payer: Self-pay | Admitting: Family Medicine

## 2014-07-16 ENCOUNTER — Ambulatory Visit: Payer: BC Managed Care – PPO

## 2014-07-21 ENCOUNTER — Ambulatory Visit
Admission: RE | Admit: 2014-07-21 | Discharge: 2014-07-21 | Disposition: A | Discharge status: Home / Self Care | Payer: BC Managed Care – PPO | Source: Ambulatory Visit | Attending: Radiation Oncology | Admitting: Radiation Oncology

## 2014-07-21 ENCOUNTER — Encounter: Payer: Self-pay | Admitting: Radiation Oncology

## 2014-07-21 DIAGNOSIS — C50511 Malignant neoplasm of lower-outer quadrant of right female breast: Secondary | ICD-10-CM

## 2014-07-21 NOTE — Progress Notes (Signed)
Tina [name] here for reassessment s/p radiation to herright breast which completed 06/07/14.  She has no voiced concerns, but notes "occassional" shooting pains in her breast. Minimal hyperpigmentation note and skin on left breast is intact.  Denies fatigue.    07/09/14 she had Da Vinci robotic bilateral salpingo-oophorectomy, extensive lysis of adhesions.

## 2014-07-21 NOTE — Progress Notes (Signed)
Radiation Oncology         [phone] ________________________________  Name: Tina [name] MRN: [id number]  Date: 07/21/2014  DOB: [birth year]  Follow-Up Visit Note  Outpatient  CC: [name], DO  Rulon Eisenmenger, MD  Diagnosis and Prior Radiotherapy:    ICD-9-CM ICD-10-CM   1. Malignant neoplasm of lower-outer quadrant of female breast, right 174.5 C50.511    Clinical T2N0M0 Stage II Invasive Ductal Carcinoma with DCIS, Right breast, Grade III, ER 7%, PR 6%, Her2 neu +. 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   She has no voiced concerns, but notes "ocassional" shooting pains in her breast.   07/09/14 she had Da Vinci robotic bilateral salpingo-oophorectomy, extensive lysis of adhesions.                              ALLERGIES:  is allergic to adhesive and nickel.  Meds: Current Outpatient Prescriptions  Medication Sig Dispense Refill  . amLODipine (NORVASC) 5 MG tablet Take 1 tablet (5 mg total) by mouth daily. 30 tablet 3  . aspirin 81 MG tablet Take 81 mg by mouth daily.    Marland Kitchen b complex vitamins capsule Take 1 capsule by mouth daily.    . Calcium Carbonate-Vit D-Min (CALTRATE 600+D PLUS MINERALS PO) Take 1 tablet by mouth daily.    . diphenhydrAMINE (SOMINEX) 25 MG tablet Take 25 mg by mouth at bedtime as needed for sleep.    Marland Kitchen gabapentin (NEURONTIN) 100 MG capsule TAKE THREE CAPSULES BY MOUTH THREE TIMES DAILY 270 capsule 1  . ibuprofen (ADVIL,MOTRIN) 800 MG tablet Take 1 tablet (800 mg total) by mouth every 8 (eight) hours as needed for mild pain. 30 tablet 4  . lidocaine-prilocaine (EMLA) cream Apply topically as needed. (Patient taking differently: Apply topically as needed. Uses when she gets  treatment) 30 g 6  . lisinopril-hydrochlorothiazide (PRINZIDE,ZESTORETIC) 20-12.5 MG per tablet TAKE TWO TABLETS BY MOUTH ONCE DAILY 120 tablet 0  . loratadine (CLARITIN) 10 MG tablet Take 10 mg by mouth daily.    Marland Kitchen LORazepam (ATIVAN) 0.5 MG tablet Take 1 tablet (0.5 mg total) by mouth every 6 (six) hours as needed (Nausea or vomiting). (Patient taking differently: Take 0.5 mg by mouth every 6 (six) hours as needed for anxiety. ) 30 tablet 0  . omeprazole (PRILOSEC) 40 MG capsule TAKE ONE CAPSULE BY MOUTH ONCE DAILY 30 capsule 2  . Trastuzumab (HERCEPTIN IV) Inject into the vein.     Marland Kitchen BIOTIN 5000 PO Take 1 capsule by mouth daily.     . Cholecalciferol (VITAMIN D3) 10000 UNITS capsule Take 10,000 Units by mouth daily.    Marland Kitchen VITAMIN E PO Take 1 capsule by mouth daily.      No current facility-administered medications for this encounter.   Facility-Administered Medications Ordered in Other Encounters  Medication Dose Route Frequency Provider Last Rate Last Dose  . 0.9 %  sodium chloride infusion   Intravenous Continuous Minette Headland, NP 1,000 mL/hr at 12/26/13 0915    . prochlorperazine (COMPAZINE) injection 10 mg  10 mg Intravenous Q6H PRN Minette Headland, NP   10 mg at 12/26/13 5625    Physical Findings: The patient  is in no acute distress. Patient is alert and oriented.  height is '5\' 4"'  (1.626 m) and weight is 184 lb 14.4 oz (83.87 kg). Her temperature is 99.1 F (37.3 C). Her blood pressure is 131/83 and her pulse is 96. .   Right breast - faint residual hyperpigmentation. Skin has healed well and is not dry.   Lab Findings: Lab Results  Component Value Date   WBC 3.0* 06/25/2014   HGB 12.1 06/25/2014   HCT 36.6 06/25/2014   MCV 91.6 06/25/2014   PLT 246 06/25/2014    Radiographic Findings: No results found.  Impression/Plan: Healed well from right breast RT.  I encouraged her to continue with yearly mammography (due in Feb) and followup with medical oncology. I will  see her back on an as-needed basis. I have encouraged her to call if she has any issues or concerns in the future. I wished her the very best.   _____________________________________   Eppie Gibson, MD

## 2014-07-28 ENCOUNTER — Telehealth: Payer: Self-pay | Admitting: *Deleted

## 2014-07-28 ENCOUNTER — Ambulatory Visit (HOSPITAL_BASED_OUTPATIENT_CLINIC_OR_DEPARTMENT_OTHER): Payer: BC Managed Care – PPO | Admitting: Hematology and Oncology

## 2014-07-28 ENCOUNTER — Other Ambulatory Visit (HOSPITAL_BASED_OUTPATIENT_CLINIC_OR_DEPARTMENT_OTHER): Payer: BC Managed Care – PPO

## 2014-07-28 ENCOUNTER — Ambulatory Visit: Payer: BC Managed Care – PPO

## 2014-07-28 ENCOUNTER — Ambulatory Visit (HOSPITAL_BASED_OUTPATIENT_CLINIC_OR_DEPARTMENT_OTHER): Payer: BC Managed Care – PPO

## 2014-07-28 VITALS — BP 127/82 | HR 95 | Temp 99.1°F | Resp 18 | Ht 64.0 in | Wt 183.9 lb

## 2014-07-28 DIAGNOSIS — C50911 Malignant neoplasm of unspecified site of right female breast: Secondary | ICD-10-CM

## 2014-07-28 DIAGNOSIS — C50511 Malignant neoplasm of lower-outer quadrant of right female breast: Secondary | ICD-10-CM

## 2014-07-28 DIAGNOSIS — F329 Major depressive disorder, single episode, unspecified: Secondary | ICD-10-CM

## 2014-07-28 DIAGNOSIS — N951 Menopausal and female climacteric states: Secondary | ICD-10-CM

## 2014-07-28 DIAGNOSIS — Z170 Estrogen receptor positive status [ER+]: Secondary | ICD-10-CM

## 2014-07-28 DIAGNOSIS — Z95828 Presence of other vascular implants and grafts: Secondary | ICD-10-CM

## 2014-07-28 DIAGNOSIS — Z5112 Encounter for antineoplastic immunotherapy: Secondary | ICD-10-CM

## 2014-07-28 LAB — COMPREHENSIVE METABOLIC PANEL (CC13)
ALK PHOS: 69 U/L (ref 40–150)
ALT: 17 U/L (ref 0–55)
AST: 20 U/L (ref 5–34)
Albumin: 3.8 g/dL (ref 3.5–5.0)
Anion Gap: 9 mEq/L (ref 3–11)
BILIRUBIN TOTAL: 0.57 mg/dL (ref 0.20–1.20)
BUN: 11.9 mg/dL (ref 7.0–26.0)
CO2: 27 mEq/L (ref 22–29)
CREATININE: 1 mg/dL (ref 0.6–1.1)
Calcium: 9.5 mg/dL (ref 8.4–10.4)
Chloride: 105 mEq/L (ref 98–109)
EGFR: 75 mL/min/{1.73_m2} — ABNORMAL LOW (ref >90)
GLUCOSE: 141 mg/dL — AB (ref 70–140)
Potassium: 3.7 mEq/L (ref 3.5–5.1)
Sodium: 141 mEq/L (ref 136–145)
Total Protein: 7.5 g/dL (ref 6.4–8.3)

## 2014-07-28 LAB — CBC WITH DIFFERENTIAL/PLATELET
BASO%: 0.3 % (ref 0.0–2.0)
Basophils Absolute: 0 10*3/uL (ref 0.0–0.1)
EOS%: 1.7 % (ref 0.0–7.0)
Eosinophils Absolute: 0.1 10*3/uL (ref 0.0–0.5)
HEMATOCRIT: 33.9 % — AB (ref 34.8–46.6)
HGB: 11.7 g/dL (ref 11.6–15.9)
LYMPH#: 0.8 10*3/uL — AB (ref 0.9–3.3)
LYMPH%: 26.6 % (ref 14.0–49.7)
MCH: 31.1 pg (ref 25.1–34.0)
MCHC: 34.5 g/dL (ref 31.5–36.0)
MCV: 90.2 fL (ref 79.5–101.0)
MONO#: 0.1 10*3/uL (ref 0.1–0.9)
MONO%: 4.5 % (ref 0.0–14.0)
NEUT%: 66.9 % (ref 38.4–76.8)
NEUTROS ABS: 1.9 10*3/uL (ref 1.5–6.5)
Platelets: 266 10*3/uL (ref 145–400)
RBC: 3.76 10*6/uL (ref 3.70–5.45)
RDW: 12.9 % (ref 11.2–14.5)
WBC: 2.9 10*3/uL — AB (ref 3.9–10.3)

## 2014-07-28 MED ORDER — DIPHENHYDRAMINE HCL 25 MG PO CAPS
25.0000 mg | ORAL_CAPSULE | Freq: Once | ORAL | Status: AC
  Administered 2014-07-28: 25 mg via ORAL

## 2014-07-28 MED ORDER — HEPARIN SOD (PORK) LOCK FLUSH 100 UNIT/ML IV SOLN
500.0000 [IU] | Freq: Once | INTRAVENOUS | Status: AC | PRN
  Administered 2014-07-28: 500 [IU]
  Filled 2014-07-28: qty 5

## 2014-07-28 MED ORDER — OMEPRAZOLE 40 MG PO CPDR: 40.0000 mg | DELAYED_RELEASE_CAPSULE | Freq: Every day | ORAL | Status: DC

## 2014-07-28 MED ORDER — SODIUM CHLORIDE 0.9 % IJ SOLN
10.0000 mL | INTRAMUSCULAR | Status: DC | PRN
  Administered 2014-07-28: 10 mL
  Filled 2014-07-28: qty 10

## 2014-07-28 MED ORDER — DIPHENHYDRAMINE HCL 25 MG PO CAPS
ORAL_CAPSULE | ORAL | Status: AC
  Filled 2014-07-28: qty 1

## 2014-07-28 MED ORDER — TRASTUZUMAB CHEMO INJECTION 440 MG
6.0000 mg/kg | Freq: Once | INTRAVENOUS | Status: AC
  Administered 2014-07-28: 504 mg via INTRAVENOUS
  Filled 2014-07-28: qty 24

## 2014-07-28 MED ORDER — VENLAFAXINE HCL ER 37.5 MG PO CP24
37.5000 mg | ORAL_CAPSULE | Freq: Every day | ORAL | Status: DC
Start: 2014-07-28 — End: 2014-09-07

## 2014-07-28 MED ORDER — ANASTROZOLE 1 MG PO TABS: 1.0000 mg | ORAL_TABLET | Freq: Every day | ORAL | Status: DC

## 2014-07-28 MED ORDER — SODIUM CHLORIDE 0.9 % IJ SOLN
10.0000 mL | INTRAMUSCULAR | Status: DC | PRN
  Administered 2014-07-28: 10 mL via INTRAVENOUS
  Filled 2014-07-28: qty 10

## 2014-07-28 MED ORDER — SODIUM CHLORIDE 0.9 % IV SOLN
Freq: Once | INTRAVENOUS | Status: AC
  Administered 2014-07-28: 12:00:00 via INTRAVENOUS

## 2014-07-28 MED ORDER — ACETAMINOPHEN 325 MG PO TABS
ORAL_TABLET | ORAL | Status: AC
  Filled 2014-07-28: qty 2

## 2014-07-28 MED ORDER — ACETAMINOPHEN 325 MG PO TABS
650.0000 mg | ORAL_TABLET | Freq: Once | ORAL | Status: AC
  Administered 2014-07-28: 650 mg via ORAL

## 2014-07-28 NOTE — Patient Instructions (Signed)
[facility] Discharge Instructions for Patients Receiving Chemotherapy  Today you received the following chemotherapy agents Herceptin.  To help prevent nausea and vomiting after your treatment, we encourage you to take your nausea medication as prescribed.   If you develop nausea and vomiting that is not controlled by your nausea medication, call the clinic.   BELOW ARE SYMPTOMS THAT SHOULD BE REPORTED IMMEDIATELY:  *FEVER GREATER THAN 100.5 F  *CHILLS WITH OR WITHOUT FEVER  NAUSEA AND VOMITING THAT IS NOT CONTROLLED WITH YOUR NAUSEA MEDICATION  *UNUSUAL SHORTNESS OF BREATH  *UNUSUAL BRUISING OR BLEEDING  TENDERNESS IN MOUTH AND THROAT WITH OR WITHOUT PRESENCE OF ULCERS  *URINARY PROBLEMS  *BOWEL PROBLEMS  UNUSUAL RASH Items with * indicate a potential emergency and should be followed up as soon as possible.  Feel free to call the clinic you have any questions or concerns. The clinic phone number is [phone].    

## 2014-07-28 NOTE — Patient Instructions (Signed)

## 2014-07-28 NOTE — Telephone Encounter (Signed)
Per staff message and POF I have scheduled appts. Advised scheduler of appts. JMW  

## 2014-07-28 NOTE — Progress Notes (Signed)
Patient Care Team: [name], DO as PCP - General (Family Medicine) [name], MD as Consulting Physician (Emergency Medicine) [name], MD as Consulting Physician (Hematology and Oncology) [name], MD as Consulting Physician (Obstetrics and Gynecology)  DIAGNOSIS: Breast cancer of lower-outer quadrant of right female breast   Staging form: Breast, AJCC 7th Edition     Clinical: Stage Unknown (T2, NX, cM0) - Signed by [name], MD on 09/20/2013     Pathologic: No stage assigned - Unsigned   SUMMARY OF ONCOLOGIC HISTORY:   Breast cancer of lower-outer quadrant of right female breast   09/14/2013 Breast MRI lower outer right breast 2.2 x 1.3 x 1.5 cm Irregular mass. In the upper outer left breast there was a 7 x 5 x 8 mm indeterminate enhancing mass. Also another 9 x 7 x 6 mm indeterminant enhancing mass at 9:00 in the left breast was noted   09/15/2013 Clinical Stage T2Nx IIA invasive ductal carcinoma, grade 3, ER positive, HER-2/neu positive invasive ductal carcinoma of the right breast   10/02/2013 - 02/12/2014 Chemotherapy neoadjuvant Docetaxel, Carboplatin, Trastuzumab, Pertuzumab X 4 cycles; Taxotere stopped for fluid retention replaced with Gemcitabine 800 mg/m2; Zolodex Q 4 weeks   03/01/2014 Definitive Surgery Lumpectomy with SLN (Dr.Wakefield)   03/01/2014 Pathologic Stage ypT1b, pN0, pMX ; 0.6 cm, Grade 3, margins Neg; inv mammary cancer with heterogeneous cartilaginous differentiation (carcinosarcoma?); Er 7%, PR: 6%; Her 2: Amplified (3.55); 3 SLN Neg   04/27/2014 - 06/16/2014 Radiation Therapy adjuvant radiation therapy   07/28/2014 -  Anti-estrogen oral therapy Anastrozole 1 mg daily    CHIEF COMPLIANT: completed radiation therapy  INTERVAL HISTORY: Tina [name] is a 53 year old of maxillary with above-mentioned history of right-sided breast cancer treated with neoadjuvant chemotherapy followed by lumpectomy and radiation. Should complete her radiation  mid-November and is here today to discuss antiestrogen therapy with anastrozole. She is continuing maintenance Herceptin treatment. She had undergone hysterectomy and bilateral salpingo-oophorectomy. Since she is having a lot of hot flashes and mood swings and depression symptoms. She started an exercise regimen with the [employer] program.  REVIEW OF SYSTEMS:   Constitutional: Denies fevers, chills or abnormal weight loss Eyes: Denies blurriness of vision Ears, nose, mouth, throat, and face: Denies mucositis or sore throat Respiratory: Denies cough, dyspnea or wheezes Cardiovascular: Denies palpitation, chest discomfort or lower extremity swelling Gastrointestinal:  Denies nausea, heartburn or change in bowel habits Skin: Denies abnormal skin rashes Lymphatics: Denies new lymphadenopathy or easy bruising Neurological:Denies numbness, tingling or new weaknesses Behavioral/Psych: mood swings and depression Breast:  denies any pain or lumps or nodules in either breasts All other systems were reviewed with the patient and are negative.  I have reviewed the past medical history, past surgical history, social history and family history with the patient and they are unchanged from previous note.  ALLERGIES:  is allergic to adhesive and nickel.  MEDICATIONS:  Current Outpatient Prescriptions  Medication Sig Dispense Refill  . amLODipine (NORVASC) 5 MG tablet Take 1 tablet (5 mg total) by mouth daily. 30 tablet 3  . aspirin 81 MG tablet Take 81 mg by mouth daily.    Marland Kitchen b complex vitamins capsule Take 1 capsule by mouth daily.    Marland Kitchen BIOTIN 5000 PO Take 1 capsule by mouth daily.     . Calcium Carbonate-Vit D-Min (CALTRATE 600+D PLUS MINERALS PO) Take 1 tablet by mouth daily.    . Cholecalciferol (VITAMIN D3) 10000 UNITS capsule Take  10,000 Units by mouth daily.    . diphenhydrAMINE (SOMINEX) 25 MG tablet Take 25 mg by mouth at bedtime as needed for sleep.    Marland Kitchen gabapentin (NEURONTIN) 100 MG  capsule TAKE THREE CAPSULES BY MOUTH THREE TIMES DAILY 270 capsule 1  . ibuprofen (ADVIL,MOTRIN) 800 MG tablet Take 1 tablet (800 mg total) by mouth every 8 (eight) hours as needed for mild pain. 30 tablet 4  . lidocaine-prilocaine (EMLA) cream Apply topically as needed. (Patient taking differently: Apply topically as needed. Uses when she gets treatment) 30 g 6  . lisinopril-hydrochlorothiazide (PRINZIDE,ZESTORETIC) 20-12.5 MG per tablet TAKE TWO TABLETS BY MOUTH ONCE DAILY 120 tablet 0  . loratadine (CLARITIN) 10 MG tablet Take 10 mg by mouth daily.    Marland Kitchen LORazepam (ATIVAN) 0.5 MG tablet Take 1 tablet (0.5 mg total) by mouth every 6 (six) hours as needed (Nausea or vomiting). (Patient taking differently: Take 0.5 mg by mouth every 6 (six) hours as needed for anxiety. ) 30 tablet 0  . omeprazole (PRILOSEC) 40 MG capsule Take 1 capsule (40 mg total) by mouth daily. 30 capsule 2  . Trastuzumab (HERCEPTIN IV) Inject into the vein.     Marland Kitchen VITAMIN E PO Take 1 capsule by mouth daily.     Marland Kitchen anastrozole (ARIMIDEX) 1 MG tablet Take 1 tablet (1 mg total) by mouth daily. 30 tablet 0  . venlafaxine XR (EFFEXOR-XR) 37.5 MG 24 hr capsule Take 1 capsule (37.5 mg total) by mouth daily with breakfast. 30 capsule 3   No current facility-administered medications for this visit.   Facility-Administered Medications Ordered in Other Visits  Medication Dose Route Frequency Provider Last Rate Last Dose  . 0.9 %  sodium chloride infusion   Intravenous Continuous Minette Headland, NP 1,000 mL/hr at 12/26/13 0915    . prochlorperazine (COMPAZINE) injection 10 mg  10 mg Intravenous Q6H PRN Minette Headland, NP   10 mg at 12/26/13 8768    PHYSICAL EXAMINATION: ECOG PERFORMANCE STATUS: 1 - Symptomatic but completely ambulatory  Filed Vitals:   07/28/14 1037  BP: 127/82  Pulse: 95  Temp: 99.1 F (37.3 C)  Resp: 18   Filed Weights   07/28/14 1037  Weight: 183 lb 14.4 oz (83.416 kg)    GENERAL:alert, no  distress and comfortable SKIN: skin color, texture, turgor are normal, no rashes or significant lesions EYES: normal, Conjunctiva are pink and non-injected, sclera clear OROPHARYNX:no exudate, no erythema and lips, buccal mucosa, and tongue normal  NECK: supple, thyroid normal size, non-tender, without nodularity LYMPH:  no palpable lymphadenopathy in the cervical, axillary or inguinal LUNGS: clear to auscultation and percussion with normal breathing effort HEART: regular rate & rhythm and no murmurs and no lower extremity edema ABDOMEN:abdomen soft, non-tender and normal bowel sounds Musculoskeletal:no cyanosis of digits and no clubbing  NEURO: alert & oriented x 3 with fluent speech, no focal motor/sensory deficits   LABORATORY DATA:  I have reviewed the data as listed   Chemistry      Component Value Date/Time   NA 141 07/28/2014 1013   NA 138 09/25/2013 1320   K 3.7 07/28/2014 1013   K 3.6* 09/25/2013 1320   CL 98 09/25/2013 1320   CL 104 06/16/2012 1303   CO2 27 07/28/2014 1013   CO2 27 09/25/2013 1320   BUN 11.9 07/28/2014 1013   BUN 9 09/25/2013 1320   CREATININE 1.0 07/28/2014 1013   CREATININE 0.82 09/25/2013 1320  Component Value Date/Time   CALCIUM 9.5 07/28/2014 1013   CALCIUM 10.0 09/25/2013 1320   ALKPHOS 69 07/28/2014 1013   ALKPHOS 42 10/04/2012 1830   AST 20 07/28/2014 1013   AST 32 10/04/2012 1830   ALT 17 07/28/2014 1013   ALT 27 10/04/2012 1830   BILITOT 0.57 07/28/2014 1013   BILITOT 0.7 10/04/2012 1830       Lab Results  Component Value Date   WBC 2.9* 07/28/2014   HGB 11.7 07/28/2014   HCT 33.9* 07/28/2014   MCV 90.2 07/28/2014   PLT 266 07/28/2014   NEUTROABS 1.9 07/28/2014   ASSESSMENT & PLAN:  Breast cancer of lower-outer quadrant of right female breast right breast T2N0 IIA invasive ductal carcinoma, grade 3, ER 7% PR 6%, Ki-67 70%, HER-2/neu positive. Status post neoadjuvant [place] for just started 10/02/2013 4 cycles stopped due  to neuropathy and fluid retention, gemcitabine carboplatin Herceptin Perjeta 2 with Zoladex to 28 days status post lumpectomy 03/01/2014 residual 0.6 cm tumor grade 3. Completed radiation therapy November 2015  Current therapy: Antiestrogen therapy and continuation of Herceptin maintenance  Arimidex counseling:We discussed the risks and benefits of anti-estrogen therapy with aromatase inhibitors. These include but not limited to insomnia, hot flashes, mood changes, vaginal dryness, bone density loss, and weight gain. Although rare, serious side effects including  risk of blood clots were also discussed. We strongly believe that the benefits far outweigh the risks. Patient understands these risks and consented to starting treatment. Planned treatment duration is 5 years. Prescribed anastrozole 1 mg daily for one month to see if she tolerates it  Menopausal symptoms: Since patient had hysterectomy and oophorectomy, she is having a lot of hot flashes depression symptoms and mood swings. I recommended and I started her on Effexor X are 37.5 mg daily.   Return to clinic in one month for toxicity check on anastrozole     No orders of the defined types were placed in this encounter.   The patient has a good understanding of the overall plan. she agrees with it. She will call with any problems that may develop before her next visit here.   Rulon Eisenmenger, MD 07/28/2014 11:18 AM

## 2014-07-28 NOTE — Assessment & Plan Note (Addendum)
right breast T2N0 IIA invasive ductal carcinoma, grade 3, ER 7% PR 6%, Ki-67 70%, HER-2/neu positive. Status post neoadjuvant [place] for just started 10/02/2013 4 cycles stopped due to neuropathy and fluid retention, gemcitabine carboplatin Herceptin Perjeta 2 with Zoladex to 28 days status post lumpectomy 03/01/2014 residual 0.6 cm tumor grade 3. Completed radiation therapy November 2015  Current therapy: Antiestrogen therapy and continuation of Herceptin maintenance  Arimidex counseling:We discussed the risks and benefits of anti-estrogen therapy with aromatase inhibitors. These include but not limited to insomnia, hot flashes, mood changes, vaginal dryness, bone density loss, and weight gain. Although rare, serious side effects including  risk of blood clots were also discussed. We strongly believe that the benefits far outweigh the risks. Patient understands these risks and consented to starting treatment. Planned treatment duration is 5 years. Prescribed anastrozole 1 mg daily for one month to see if she tolerates it  Menopausal symptoms: Since patient had hysterectomy and oophorectomy, she is having a lot of hot flashes depression symptoms and mood swings. I recommended and I started her on Effexor X are 37.5 mg daily.   Return to clinic in one month for toxicity check on anastrozole

## 2014-08-17 ENCOUNTER — Other Ambulatory Visit: Payer: Self-pay | Admitting: Family Medicine

## 2014-08-18 ENCOUNTER — Ambulatory Visit (HOSPITAL_BASED_OUTPATIENT_CLINIC_OR_DEPARTMENT_OTHER): Payer: BLUE CROSS/BLUE SHIELD

## 2014-08-18 ENCOUNTER — Ambulatory Visit (HOSPITAL_BASED_OUTPATIENT_CLINIC_OR_DEPARTMENT_OTHER): Payer: BLUE CROSS/BLUE SHIELD | Admitting: Hematology and Oncology

## 2014-08-18 ENCOUNTER — Other Ambulatory Visit (HOSPITAL_BASED_OUTPATIENT_CLINIC_OR_DEPARTMENT_OTHER): Payer: BLUE CROSS/BLUE SHIELD

## 2014-08-18 ENCOUNTER — Ambulatory Visit: Payer: BLUE CROSS/BLUE SHIELD

## 2014-08-18 ENCOUNTER — Telehealth: Payer: Self-pay | Admitting: *Deleted

## 2014-08-18 DIAGNOSIS — C50511 Malignant neoplasm of lower-outer quadrant of right female breast: Secondary | ICD-10-CM

## 2014-08-18 DIAGNOSIS — K5900 Constipation, unspecified: Secondary | ICD-10-CM

## 2014-08-18 DIAGNOSIS — R5383 Other fatigue: Secondary | ICD-10-CM

## 2014-08-18 DIAGNOSIS — N951 Menopausal and female climacteric states: Secondary | ICD-10-CM | POA: Diagnosis not present

## 2014-08-18 DIAGNOSIS — Z5112 Encounter for antineoplastic immunotherapy: Secondary | ICD-10-CM

## 2014-08-18 DIAGNOSIS — Z95828 Presence of other vascular implants and grafts: Secondary | ICD-10-CM

## 2014-08-18 LAB — CBC WITH DIFFERENTIAL/PLATELET
BASO%: 1 % (ref 0.0–2.0)
BASOS ABS: 0 10*3/uL (ref 0.0–0.1)
EOS%: 1.4 % (ref 0.0–7.0)
Eosinophils Absolute: 0 10*3/uL (ref 0.0–0.5)
HEMATOCRIT: 35.2 % (ref 34.8–46.6)
HEMOGLOBIN: 11.7 g/dL (ref 11.6–15.9)
LYMPH#: 1 10*3/uL (ref 0.9–3.3)
LYMPH%: 38.4 % (ref 14.0–49.7)
MCH: 30.9 pg (ref 25.1–34.0)
MCHC: 33.4 g/dL (ref 31.5–36.0)
MCV: 92.7 fL (ref 79.5–101.0)
MONO#: 0.2 10*3/uL (ref 0.1–0.9)
MONO%: 8.9 % (ref 0.0–14.0)
NEUT#: 1.3 10*3/uL — ABNORMAL LOW (ref 1.5–6.5)
NEUT%: 50.3 % (ref 38.4–76.8)
PLATELETS: 259 10*3/uL (ref 145–400)
RBC: 3.79 10*6/uL (ref 3.70–5.45)
RDW: 11.9 % (ref 11.2–14.5)
WBC: 2.5 10*3/uL — AB (ref 3.9–10.3)

## 2014-08-18 LAB — COMPREHENSIVE METABOLIC PANEL (CC13)
ALT: 21 U/L (ref 0–55)
AST: 24 U/L (ref 5–34)
Albumin: 4 g/dL (ref 3.5–5.0)
Alkaline Phosphatase: 75 U/L (ref 40–150)
Anion Gap: 8 mEq/L (ref 3–11)
BUN: 9.8 mg/dL (ref 7.0–26.0)
CALCIUM: 9.6 mg/dL (ref 8.4–10.4)
CHLORIDE: 103 meq/L (ref 98–109)
CO2: 28 meq/L (ref 22–29)
Creatinine: 1 mg/dL (ref 0.6–1.1)
EGFR: 78 mL/min/{1.73_m2} — ABNORMAL LOW (ref >90)
Glucose: 105 mg/dl (ref 70–140)
POTASSIUM: 3.6 meq/L (ref 3.5–5.1)
SODIUM: 138 meq/L (ref 136–145)
TOTAL PROTEIN: 7.5 g/dL (ref 6.4–8.3)
Total Bilirubin: 0.5 mg/dL (ref 0.20–1.20)

## 2014-08-18 MED ORDER — DIPHENHYDRAMINE HCL 25 MG PO CAPS
25.0000 mg | ORAL_CAPSULE | Freq: Once | ORAL | Status: AC
  Administered 2014-08-18: 25 mg via ORAL

## 2014-08-18 MED ORDER — HEPARIN SOD (PORK) LOCK FLUSH 100 UNIT/ML IV SOLN
500.0000 [IU] | Freq: Once | INTRAVENOUS | Status: AC | PRN
  Administered 2014-08-18: 500 [IU]
  Filled 2014-08-18: qty 5

## 2014-08-18 MED ORDER — DIPHENHYDRAMINE HCL 25 MG PO CAPS
ORAL_CAPSULE | ORAL | Status: AC
  Filled 2014-08-18: qty 1

## 2014-08-18 MED ORDER — SODIUM CHLORIDE 0.9 % IV SOLN
Freq: Once | INTRAVENOUS | Status: AC
  Administered 2014-08-18: 15:00:00 via INTRAVENOUS

## 2014-08-18 MED ORDER — SODIUM CHLORIDE 0.9 % IJ SOLN
10.0000 mL | INTRAMUSCULAR | Status: DC | PRN
  Administered 2014-08-18: 10 mL
  Filled 2014-08-18: qty 10

## 2014-08-18 MED ORDER — SODIUM CHLORIDE 0.9 % IJ SOLN
10.0000 mL | INTRAMUSCULAR | Status: DC | PRN
  Administered 2014-08-18: 10 mL via INTRAVENOUS
  Filled 2014-08-18: qty 10

## 2014-08-18 MED ORDER — ACETAMINOPHEN 325 MG PO TABS
ORAL_TABLET | ORAL | Status: AC
  Filled 2014-08-18: qty 2

## 2014-08-18 MED ORDER — ACETAMINOPHEN 325 MG PO TABS
650.0000 mg | ORAL_TABLET | Freq: Once | ORAL | Status: AC
  Administered 2014-08-18: 650 mg via ORAL

## 2014-08-18 MED ORDER — TRASTUZUMAB CHEMO INJECTION 440 MG
6.0000 mg/kg | Freq: Once | INTRAVENOUS | Status: AC
  Administered 2014-08-18: 504 mg via INTRAVENOUS
  Filled 2014-08-18: qty 24

## 2014-08-18 NOTE — Progress Notes (Signed)
Patient Care Team: [name], DO as PCP - General (Family Medicine) [name], MD as Consulting Physician (Emergency Medicine) [name], MD as Consulting Physician (Hematology and Oncology) [name], MD as Consulting Physician (Obstetrics and Gynecology)  DIAGNOSIS: Breast cancer of lower-outer quadrant of right female breast   Staging form: Breast, AJCC 7th Edition     Clinical: Stage Unknown (T2, NX, cM0) - Signed by [name], MD on 09/20/2013     Pathologic: No stage assigned - Unsigned   SUMMARY OF ONCOLOGIC HISTORY:   Breast cancer of lower-outer quadrant of right female breast   09/14/2013 Breast MRI lower outer right breast 2.2 x 1.3 x 1.5 cm Irregular mass. In the upper outer left breast there was a 7 x 5 x 8 mm indeterminate enhancing mass. Also another 9 x 7 x 6 mm indeterminant enhancing mass at 9:00 in the left breast was noted   09/15/2013 Clinical Stage T2Nx IIA invasive ductal carcinoma, grade 3, ER positive, HER-2/neu positive invasive ductal carcinoma of the right breast   10/02/2013 - 02/12/2014 Chemotherapy neoadjuvant Docetaxel, Carboplatin, Trastuzumab, Pertuzumab X 4 cycles; Taxotere stopped for fluid retention replaced with Gemcitabine 800 mg/m2; Zolodex Q 4 weeks   03/01/2014 Definitive Surgery Lumpectomy with SLN (Dr.Wakefield)   03/01/2014 Pathologic Stage ypT1b, pN0, pMX ; 0.6 cm, Grade 3, margins Neg; inv mammary cancer with heterogeneous cartilaginous differentiation (carcinosarcoma?); Er 7%, PR: 6%; Her 2: Amplified (3.55); 3 SLN Neg   04/27/2014 - 06/16/2014 Radiation Therapy adjuvant radiation therapy   07/28/2014 -  Anti-estrogen oral therapy Anastrozole 1 mg daily    CHIEF COMPLIANT: Herceptin maintenance  INTERVAL HISTORY: Tina [name] is a 54 year old lady with above-mentioned history of HER-2 positive right breast cancer treated with neoadjuvant chemotherapy followed by surgery with lumpectomy and radiation therapy currently on  anastrozole 1 mg daily along with maintenance Herceptin. She is tolerating anastrozole fairly well without any major problems other than hot flashes which improved Effexor. She used to have fatigue but she is not able to do 45 minutes of cardio at the [facility]. She reported having constipation related to taking calcium supplementation. She is eating better food and increasing fruits and vegetables.  REVIEW OF SYSTEMS:   Constitutional: Denies fevers, chills or abnormal weight loss Eyes: Denies blurriness of vision Ears, nose, mouth, throat, and face: Denies mucositis or sore throat Respiratory: Denies cough, dyspnea or wheezes Cardiovascular: Denies palpitation, chest discomfort or lower extremity swelling Gastrointestinal:  Denies nausea, heartburn or change in bowel habits Skin: Denies abnormal skin rashes Lymphatics: Denies new lymphadenopathy or easy bruising Neurological:Denies numbness, tingling or new weaknesses Behavioral/Psych: Mood is stable, no new changes  Breast:  denies any pain or lumps or nodules in either breasts All other systems were reviewed with the patient and are negative.  I have reviewed the past medical history, past surgical history, social history and family history with the patient and they are unchanged from previous note.  ALLERGIES:  is allergic to adhesive and nickel.  MEDICATIONS:  Current Outpatient Prescriptions  Medication Sig Dispense Refill  . amLODipine (NORVASC) 5 MG tablet TAKE ONE TABLET BY MOUTH ONCE DAILY 30 tablet 2  . anastrozole (ARIMIDEX) 1 MG tablet Take 1 tablet (1 mg total) by mouth daily. 30 tablet 0  . aspirin 81 MG tablet Take 81 mg by mouth daily.    Marland Kitchen b complex vitamins capsule Take 1 capsule by mouth daily.    Marland Kitchen BIOTIN 5000 PO Take 1  capsule by mouth daily.     . Calcium Carbonate-Vit D-Min (CALTRATE 600+D PLUS MINERALS PO) Take 1 tablet by mouth daily.    . Cholecalciferol (VITAMIN D3) 10000 UNITS capsule Take 10,000 Units by mouth  daily.    . diphenhydrAMINE (SOMINEX) 25 MG tablet Take 25 mg by mouth at bedtime as needed for sleep.    Marland Kitchen gabapentin (NEURONTIN) 100 MG capsule TAKE THREE CAPSULES BY MOUTH THREE TIMES DAILY 270 capsule 1  . ibuprofen (ADVIL,MOTRIN) 800 MG tablet Take 1 tablet (800 mg total) by mouth every 8 (eight) hours as needed for mild pain. 30 tablet 4  . lidocaine-prilocaine (EMLA) cream Apply topically as needed. (Patient taking differently: Apply topically as needed. Uses when she gets treatment) 30 g 6  . lisinopril-hydrochlorothiazide (PRINZIDE,ZESTORETIC) 20-12.5 MG per tablet TAKE TWO TABLETS BY MOUTH ONCE DAILY 120 tablet 0  . loratadine (CLARITIN) 10 MG tablet Take 10 mg by mouth daily.    Marland Kitchen LORazepam (ATIVAN) 0.5 MG tablet Take 1 tablet (0.5 mg total) by mouth every 6 (six) hours as needed (Nausea or vomiting). (Patient taking differently: Take 0.5 mg by mouth every 6 (six) hours as needed for anxiety. ) 30 tablet 0  . omeprazole (PRILOSEC) 40 MG capsule Take 1 capsule (40 mg total) by mouth daily. 30 capsule 2  . oxycodone (OXY-IR) 5 MG capsule Take 5 mg by mouth every 4 (four) hours as needed.    . Trastuzumab (HERCEPTIN IV) Inject into the vein.     Marland Kitchen venlafaxine XR (EFFEXOR-XR) 37.5 MG 24 hr capsule Take 1 capsule (37.5 mg total) by mouth daily with breakfast. 30 capsule 3  . VITAMIN E PO Take 1 capsule by mouth daily.      No current facility-administered medications for this visit.   Facility-Administered Medications Ordered in Other Visits  Medication Dose Route Frequency Provider Last Rate Last Dose  . 0.9 %  sodium chloride infusion   Intravenous Continuous Minette Headland, NP 1,000 mL/hr at 12/26/13 0915    . prochlorperazine (COMPAZINE) injection 10 mg  10 mg Intravenous Q6H PRN Minette Headland, NP   10 mg at 12/26/13 0917  . sodium chloride 0.9 % injection 10 mL  10 mL Intravenous PRN Rulon Eisenmenger, MD   10 mL at 08/18/14 1416    PHYSICAL EXAMINATION: ECOG PERFORMANCE  STATUS: 0 - Asymptomatic  Filed Vitals:   08/18/14 1421  BP: 124/74  Pulse: 79  Temp: 99.5 F (37.5 C)  Resp: 18   Filed Weights   08/18/14 1421  Weight: 179 lb 12.8 oz (81.557 kg)    GENERAL:alert, no distress and comfortable SKIN: skin color, texture, turgor are normal, no rashes or significant lesions EYES: normal, Conjunctiva are pink and non-injected, sclera clear OROPHARYNX:no exudate, no erythema and lips, buccal mucosa, and tongue normal  NECK: supple, thyroid normal size, non-tender, without nodularity LYMPH:  no palpable lymphadenopathy in the cervical, axillary or inguinal LUNGS: clear to auscultation and percussion with normal breathing effort HEART: regular rate & rhythm and no murmurs and no lower extremity edema ABDOMEN:abdomen soft, non-tender and normal bowel sounds Musculoskeletal:no cyanosis of digits and no clubbing  NEURO: alert & oriented x 3 with fluent speech, no focal motor/sensory deficits  LABORATORY DATA:  I have reviewed the data as listed   Chemistry      Component Value Date/Time   NA 138 08/18/2014 1355   NA 138 09/25/2013 1320   K 3.6 08/18/2014 1355   K  3.6* 09/25/2013 1320   CL 98 09/25/2013 1320   CL 104 06/16/2012 1303   CO2 28 08/18/2014 1355   CO2 27 09/25/2013 1320   BUN 9.8 08/18/2014 1355   BUN 9 09/25/2013 1320   CREATININE 1.0 08/18/2014 1355   CREATININE 0.82 09/25/2013 1320      Component Value Date/Time   CALCIUM 9.6 08/18/2014 1355   CALCIUM 10.0 09/25/2013 1320   ALKPHOS 75 08/18/2014 1355   ALKPHOS 42 10/04/2012 1830   AST 24 08/18/2014 1355   AST 32 10/04/2012 1830   ALT 21 08/18/2014 1355   ALT 27 10/04/2012 1830   BILITOT 0.50 08/18/2014 1355   BILITOT 0.7 10/04/2012 1830       Lab Results  Component Value Date   WBC 2.5* 08/18/2014   HGB 11.7 08/18/2014   HCT 35.2 08/18/2014   MCV 92.7 08/18/2014   PLT 259 08/18/2014   NEUTROABS 1.3* 08/18/2014   ASSESSMENT & PLAN:  Breast cancer of lower-outer  quadrant of right female breast right breast T2N0 IIA invasive ductal carcinoma, grade 3, ER 7% PR 6%, Ki-67 70%, HER-2/neu positive. Status post neoadjuvant [place] for just started 10/02/2013 4 cycles stopped due to neuropathy and fluid retention, gemcitabine carboplatin Herceptin Perjeta 2 with Zoladex to 28 days status post lumpectomy 03/01/2014 residual 0.6 cm tumor grade 3. Completed radiation therapy November 2015  Current therapy: Anastrozole 1 mg daily and continuation of Herceptin maintenance to complete 10/20/2014 Anastrozole toxicities: 1. Hot flashes: Improved with Effexor 2. Mild fatigue but patient is able to exercise for 45 minutes on cardio 3. Constipation related to calcium supplementation: I recommended Colace if she continues to have problems encourage her to drink more water  Survivorship: Patient has changed her diet and eat more fruits and vegetables and is exercising and staying active.  Return to clinic on the last year for septic maintenance which will be 10/20/2014        Orders Placed This Encounter  Procedures  . MM Digital Diagnostic Bilat    Standing Status: Future     Number of Occurrences:      Standing Expiration Date: 08/18/2015    Order Specific Question:  Reason for Exam (SYMPTOM  OR DIAGNOSIS REQUIRED)    Answer:  Annual follow up Right breast cancer    Order Specific Question:  Is the patient pregnant?    Answer:  No    Order Specific Question:  Preferred imaging location?    Answer:  [facility]   The patient has a good understanding of the overall plan. she agrees with it. She will call with any problems that may develop before her next visit here.   Rulon Eisenmenger, MD

## 2014-08-18 NOTE — Telephone Encounter (Signed)
Per staff message and POF I have scheduled appts. Advised scheduler of appts. JMW  

## 2014-08-18 NOTE — Patient Instructions (Signed)

## 2014-08-18 NOTE — Assessment & Plan Note (Addendum)
right breast T2N0 IIA invasive ductal carcinoma, grade 3, ER 7% PR 6%, Ki-67 70%, HER-2/neu positive. Status post neoadjuvant [place] for just started 10/02/2013 4 cycles stopped due to neuropathy and fluid retention, gemcitabine carboplatin Herceptin Perjeta 2 with Zoladex to 28 days status post lumpectomy 03/01/2014 residual 0.6 cm tumor grade 3. Completed radiation therapy November 2015  Current therapy: Anastrozole 1 mg daily and continuation of Herceptin maintenance to complete 10/20/2014 Anastrozole toxicities: 1. Hot flashes: Improved with Effexor 2. Mild fatigue but patient is able to exercise for 45 minutes on cardio 3. Constipation related to calcium supplementation: I recommended Colace if she continues to have problems encourage her to drink more water  Survivorship: Patient has changed her diet and eat more fruits and vegetables and is exercising and staying active.  Return to clinic on the last year for septic maintenance which will be 10/20/2014

## 2014-08-18 NOTE — Patient Instructions (Signed)
[facility] Discharge Instructions for Patients Receiving Chemotherapy  Today you received the following chemotherapy agents herceptin   To help prevent nausea and vomiting after your treatment, we encourage you to take your nausea medication as directed   If you develop nausea and vomiting that is not controlled by your nausea medication, call the clinic.   BELOW ARE SYMPTOMS THAT SHOULD BE REPORTED IMMEDIATELY:  *FEVER GREATER THAN 100.5 F  *CHILLS WITH OR WITHOUT FEVER  NAUSEA AND VOMITING THAT IS NOT CONTROLLED WITH YOUR NAUSEA MEDICATION  *UNUSUAL SHORTNESS OF BREATH  *UNUSUAL BRUISING OR BLEEDING  TENDERNESS IN MOUTH AND THROAT WITH OR WITHOUT PRESENCE OF ULCERS  *URINARY PROBLEMS  *BOWEL PROBLEMS  UNUSUAL RASH Items with * indicate a potential emergency and should be followed up as soon as possible.  Feel free to call the clinic you have any questions or concerns. The clinic phone number is [phone].  

## 2014-08-22 ENCOUNTER — Other Ambulatory Visit: Payer: Self-pay | Admitting: Hematology and Oncology

## 2014-08-24 ENCOUNTER — Other Ambulatory Visit: Payer: Self-pay | Admitting: Hematology and Oncology

## 2014-08-24 DIAGNOSIS — C50511 Malignant neoplasm of lower-outer quadrant of right female breast: Secondary | ICD-10-CM

## 2014-08-24 MED ORDER — ANASTROZOLE 1 MG PO TABS: 1.0000 mg | ORAL_TABLET | Freq: Every day | ORAL | Status: DC

## 2014-09-01 ENCOUNTER — Ambulatory Visit
Admission: RE | Admit: 2014-09-01 | Discharge: 2014-09-01 | Disposition: A | Discharge status: Home / Self Care | Payer: BLUE CROSS/BLUE SHIELD | Source: Ambulatory Visit | Attending: Hematology and Oncology | Admitting: Hematology and Oncology

## 2014-09-01 DIAGNOSIS — C50511 Malignant neoplasm of lower-outer quadrant of right female breast: Secondary | ICD-10-CM

## 2014-09-07 ENCOUNTER — Ambulatory Visit (INDEPENDENT_AMBULATORY_CARE_PROVIDER_SITE_OTHER): Payer: BLUE CROSS/BLUE SHIELD | Admitting: Family Medicine

## 2014-09-07 ENCOUNTER — Other Ambulatory Visit: Payer: Self-pay

## 2014-09-07 ENCOUNTER — Encounter: Payer: Self-pay | Admitting: Family Medicine

## 2014-09-07 VITALS — BP 112/82 | HR 90 | Temp 98.8°F | Ht 64.0 in | Wt 178.5 lb

## 2014-09-07 DIAGNOSIS — F411 Generalized anxiety disorder: Secondary | ICD-10-CM

## 2014-09-07 DIAGNOSIS — K219 Gastro-esophageal reflux disease without esophagitis: Secondary | ICD-10-CM

## 2014-09-07 DIAGNOSIS — R232 Flushing: Secondary | ICD-10-CM

## 2014-09-07 DIAGNOSIS — N951 Menopausal and female climacteric states: Secondary | ICD-10-CM

## 2014-09-07 DIAGNOSIS — R400 Somnolence: Secondary | ICD-10-CM

## 2014-09-07 DIAGNOSIS — R0683 Snoring: Secondary | ICD-10-CM

## 2014-09-07 DIAGNOSIS — C50511 Malignant neoplasm of lower-outer quadrant of right female breast: Secondary | ICD-10-CM

## 2014-09-07 DIAGNOSIS — G4710 Hypersomnia, unspecified: Secondary | ICD-10-CM

## 2014-09-07 DIAGNOSIS — Z853 Personal history of malignant neoplasm of breast: Secondary | ICD-10-CM

## 2014-09-07 MED ORDER — VENLAFAXINE HCL ER 75 MG PO CP24: 75.0000 mg | ORAL_CAPSULE | Freq: Every day | ORAL | Status: DC

## 2014-09-07 NOTE — Progress Notes (Signed)
HPI:  HTN/HLD: -lisinopril-hctz40-25, norvasc 5 mg added in sept and she was to f/u in 2 week  -reports:she was doing great so did not follow up -denies: CP, SOB, DOE, swelling, palpitations -just had labs with cancer center and CMP ok - has not done lipids -live strong exercise program; doing a healthy diet - has made sig changes  ? OSA: -snores at night and sometimes -husband says stops breathing at night sometimes -feels like she rests pretty well, without daytime somnolence -wants to get sleep study  Hx CTS L: -worse since cancer tx -symptoms primarily at night with radial hand finger pain and numbness, mild -hx R CTS surgery remotely - sleeping with brace really helps  GERD: -on prilosec 40 mg daily -denies: dysphagia, symptoms despite tx  Hot flashes/anxiety: -right after oophorectomy -now on effexor and doing much better - but wonders if could increase dose  Breast Ca: -s/p surgery, chemo and radiation -followed by onc for Herceptin and Anastrazole - followed by cardio-onc clinic as well  Had trigger finger release surgery.   ROS: See pertinent positives and negatives per HPI.  Past Medical History  Diagnosis Date  . Leukocytopenia   . Hypertension   . Leukopenia 06/16/2013  . Needs flu shot 06/17/2013    resolved - flu shot recvd in 04/2014  . Cancer of right breast 09/03/13    Invasive Ductal Carcinoma/Ductal Carcinoma Insitu  biopsies  . Anxiety   . GERD (gastroesophageal reflux disease)   . Degenerative joint disease     back neck and shoulder  . 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 S/P radiation therapy 04/27/2014-06/07/2014    1) Right breast, 50 Gy in 25 fractions/ 2) Right breast boost, 10 Gy in 5 fractions    Past Surgical History  Procedure Laterality Date  . Cesarean section      x 3  . Carpal tunnel release  1998    rt  . Trigger finger release  2004    rt thumb  . Portacath placement  08/2013    still  currently active   . Tubal ligation    . Trigger finger release left thumb  06/28/14    at orthopedic surgical center  . Wisdom tooth extraction    . Breast surgery  03/01/2014    hx right breast cancer- 3 lymph nodes removed and lumpectomy  . Robotic assisted bilateral salpingo oopherectomy Bilateral 07/09/2014    Procedure: ROBOTIC ASSISTED BILATERAL SALPINGO OOPHORECTOM; :LYSIS OF ADHESIONS;  Surgeon: Marvene Staff, MD;  Location: [facility];  Service: Gynecology;  Laterality: Bilateral;    Family History  Problem Relation Age of Onset  . Hypertension Mother   . Dementia Mother     small vessel disease  . Cancer Mother 30    cervical  cancer  . Cancer Father 40    prostate ca  . Diabetes Father   . Arthritis Father     History   Social History  . Marital Status: Married    Spouse Name: N/A    Number of Children: 1  . Years of Education: N/A   Occupational History  .      homemaker   Social History Main Topics  . Smoking status: Former Smoker -- 1.00 packs/day for 15 years    Types: Cigars, Cigarettes    Quit date: 02/23/2004  . Smokeless tobacco: Never Used     Comment: remote smoking history  . Alcohol Use: 0.0  oz/week    0 Shots of liquor per week     Comment: occ   . Drug Use: Yes     Comment: [place]  . Sexual Activity: Yes   Other Topics Concern  . 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  amLODipine (NORVASC) 5 MG tablet, TAKE ONE TABLET BY MOUTH ONCE DAILY, Disp: 30 tablet, Rfl: 2 .  anastrozole (ARIMIDEX) 1 MG tablet, Take 1 tablet (1 mg total) by mouth daily., Disp: 90 tablet, Rfl: 3 .  aspirin 81 MG tablet, Take 81 mg by mouth daily., Disp: , Rfl:  .  b complex vitamins capsule, Take  1 capsule by mouth daily., Disp: , Rfl:  .  BIOTIN 5000 PO, Take 1 capsule by mouth daily. , Disp: , Rfl:  .  Calcium Carbonate-Vit D-Min (CALTRATE 600+D PLUS MINERALS PO), Take 1 tablet by mouth daily., Disp: , Rfl:  .  Cholecalciferol (VITAMIN D3) 10000 UNITS capsule, Take 10,000 Units by mouth daily., Disp: , Rfl:  .  diphenhydrAMINE (SOMINEX) 25 MG tablet, Take 25 mg by mouth at bedtime as needed for sleep., Disp: , Rfl:  .  gabapentin (NEURONTIN) 100 MG capsule, TAKE THREE CAPSULES BY MOUTH THREE TIMES DAILY, Disp: 270 capsule, Rfl: 1 .  ibuprofen (ADVIL,MOTRIN) 800 MG tablet, Take 1 tablet (800 mg total) by mouth every 8 (eight) hours as needed for mild pain., Disp: 30 tablet, Rfl: 4 .  lidocaine-prilocaine (EMLA) cream, Apply topically as needed. (Patient taking differently: Apply topically as needed. Uses when she gets treatment), Disp: 30 g, Rfl: 6 .  lisinopril-hydrochlorothiazide (PRINZIDE,ZESTORETIC) 20-12.5 MG per tablet, TAKE TWO TABLETS BY MOUTH ONCE DAILY, Disp: 120 tablet, Rfl: 0 .  loratadine (CLARITIN) 10 MG tablet, Take 10 mg by mouth daily., Disp: , Rfl:  .  LORazepam (ATIVAN) 0.5 MG tablet, Take 1 tablet (0.5 mg total) by mouth every 6 (six) hours as needed (Nausea or vomiting). (Patient taking differently: Take 0.5 mg by mouth every 6 (six) hours as needed for anxiety. ), Disp: 30 tablet, Rfl: 0 .  omeprazole (PRILOSEC) 40 MG capsule, Take 1 capsule (40 mg total) by mouth daily., Disp: 30 capsule, Rfl: 2 .  oxycodone (OXY-IR) 5 MG capsule, Take 5 mg by mouth every 4 (four) hours as needed., Disp: , Rfl:  .  Trastuzumab (HERCEPTIN IV), Inject into the vein. , Disp: , Rfl:  .  venlafaxine XR (EFFEXOR-XR) 75 MG 24 hr capsule, Take 1 capsule (75 mg total) by mouth daily with breakfast., Disp: 30 capsule, Rfl: 3 .  VITAMIN E PO, Take 1 capsule by mouth daily. , Disp: , Rfl:  No current facility-administered medications for this visit.  Facility-Administered Medications Ordered in  Other Visits:  .  0.9 %  sodium chloride infusion, , Intravenous, Continuous, Minette Headland, NP, Last Rate: 1,000 mL/hr at 12/26/13 0915 .  prochlorperazine (COMPAZINE) injection 10 mg, 10 mg, Intravenous, Q6H PRN, Minette Headland, NP, 10 mg at 12/26/13 0917  EXAM:  Filed Vitals:   09/07/14 1256  BP: 112/82  Pulse: 90  Temp: 98.8 F (37.1 C)  Body mass index is 30.62 kg/(m^2).  GENERAL: vitals reviewed and listed above, alert, oriented, appears well hydrated and in no acute distress  HEENT: atraumatic, conjunttiva clear, no obvious abnormalities on inspection of external nose and ears  NECK: no obvious masses on inspection  LUNGS: clear to auscultation bilaterally, no wheezes, rales or rhonchi, good air movement  CV: HRRR, no peripheral edema  MS: moves all extremities without noticeable abnormality  PSYCH: pleasant and cooperative, no obvious depression or anxiety  ASSESSMENT AND PLAN:  Discussed the following assessment and plan:  Hot flashes  Anxiety state  Breast cancer of lower-outer quadrant of right female breast - Plan: venlafaxine XR (EFFEXOR-XR) 75 MG 24 hr capsule  Snoring - Plan: Nocturnal polysomnography (NPSG)  Daytime somnolence - Plan: Nocturnal polysomnography (NPSG)  Gastroesophageal reflux disease, esophagitis presence not specified  Hx of breast cancer  -Patient advised to return or notify a doctor immediately if symptoms worsen or persist or new concerns arise.  Patient Instructions  BEFORE YOU LEAVE: -follow up in about 4-6 weeks; morning appointment come fasting  Increase the Effexor to 75 mg daily  Keep up the excellent work on exercise and eating healthy!!!!     [name]

## 2014-09-07 NOTE — Progress Notes (Signed)
Pre visit review using our clinic review tool, if applicable. No additional management support is needed unless otherwise documented below in the visit note. 

## 2014-09-07 NOTE — Patient Instructions (Addendum)
BEFORE YOU LEAVE: -follow up in about 4-6 weeks; morning appointment come fasting  Increase the Effexor to 75 mg daily  Keep up the excellent work on exercise and eating healthy!!!!  -We placed a referral for you as discussed. It usually takes about 1-2 weeks to process and schedule this referral. If you have not heard from [place] regarding this appointment in 2 weeks please contact our office.

## 2014-09-08 ENCOUNTER — Telehealth: Payer: Self-pay | Admitting: Hematology and Oncology

## 2014-09-08 ENCOUNTER — Other Ambulatory Visit: Payer: Self-pay

## 2014-09-08 ENCOUNTER — Ambulatory Visit (HOSPITAL_BASED_OUTPATIENT_CLINIC_OR_DEPARTMENT_OTHER): Payer: BLUE CROSS/BLUE SHIELD

## 2014-09-08 ENCOUNTER — Other Ambulatory Visit (HOSPITAL_BASED_OUTPATIENT_CLINIC_OR_DEPARTMENT_OTHER): Payer: BLUE CROSS/BLUE SHIELD

## 2014-09-08 ENCOUNTER — Ambulatory Visit: Payer: BLUE CROSS/BLUE SHIELD

## 2014-09-08 DIAGNOSIS — Z95828 Presence of other vascular implants and grafts: Secondary | ICD-10-CM

## 2014-09-08 DIAGNOSIS — C50511 Malignant neoplasm of lower-outer quadrant of right female breast: Secondary | ICD-10-CM

## 2014-09-08 DIAGNOSIS — Z5112 Encounter for antineoplastic immunotherapy: Secondary | ICD-10-CM

## 2014-09-08 LAB — COMPREHENSIVE METABOLIC PANEL (CC13)
ALK PHOS: 79 U/L (ref 40–150)
ALT: 22 U/L (ref 0–55)
AST: 26 U/L (ref 5–34)
Albumin: 4 g/dL (ref 3.5–5.0)
Anion Gap: 9 mEq/L (ref 3–11)
BILIRUBIN TOTAL: 0.61 mg/dL (ref 0.20–1.20)
BUN: 12.2 mg/dL (ref 7.0–26.0)
CO2: 26 mEq/L (ref 22–29)
Calcium: 9.5 mg/dL (ref 8.4–10.4)
Chloride: 104 mEq/L (ref 98–109)
Creatinine: 1 mg/dL (ref 0.6–1.1)
EGFR: 79 mL/min/{1.73_m2} — AB (ref >90)
Glucose: 86 mg/dl (ref 70–140)
Potassium: 3.7 mEq/L (ref 3.5–5.1)
SODIUM: 139 meq/L (ref 136–145)
TOTAL PROTEIN: 7.4 g/dL (ref 6.4–8.3)

## 2014-09-08 LAB — CBC WITH DIFFERENTIAL/PLATELET
BASO%: 0.5 % (ref 0.0–2.0)
Basophils Absolute: 0 10*3/uL (ref 0.0–0.1)
EOS ABS: 0 10*3/uL (ref 0.0–0.5)
EOS%: 0.9 % (ref 0.0–7.0)
HCT: 34.5 % — ABNORMAL LOW (ref 34.8–46.6)
HGB: 12 g/dL (ref 11.6–15.9)
LYMPH%: 49.5 % (ref 14.0–49.7)
MCH: 31.7 pg (ref 25.1–34.0)
MCHC: 34.8 g/dL (ref 31.5–36.0)
MCV: 91 fL (ref 79.5–101.0)
MONO#: 0.2 10*3/uL (ref 0.1–0.9)
MONO%: 7.1 % (ref 0.0–14.0)
NEUT#: 0.9 10*3/uL — ABNORMAL LOW (ref 1.5–6.5)
NEUT%: 42 % (ref 38.4–76.8)
PLATELETS: 231 10*3/uL (ref 145–400)
RBC: 3.79 10*6/uL (ref 3.70–5.45)
RDW: 12 % (ref 11.2–14.5)
WBC: 2.1 10*3/uL — ABNORMAL LOW (ref 3.9–10.3)
lymph#: 1.1 10*3/uL (ref 0.9–3.3)

## 2014-09-08 MED ORDER — DIPHENHYDRAMINE HCL 25 MG PO CAPS
ORAL_CAPSULE | ORAL | Status: AC
  Filled 2014-09-08: qty 2

## 2014-09-08 MED ORDER — DIPHENHYDRAMINE HCL 25 MG PO CAPS
25.0000 mg | ORAL_CAPSULE | Freq: Once | ORAL | Status: AC
  Administered 2014-09-08: 25 mg via ORAL

## 2014-09-08 MED ORDER — ACETAMINOPHEN 325 MG PO TABS
ORAL_TABLET | ORAL | Status: AC
  Filled 2014-09-08: qty 2

## 2014-09-08 MED ORDER — ACETAMINOPHEN 325 MG PO TABS
650.0000 mg | ORAL_TABLET | Freq: Once | ORAL | Status: AC
  Administered 2014-09-08: 650 mg via ORAL

## 2014-09-08 MED ORDER — SODIUM CHLORIDE 0.9 % IJ SOLN
10.0000 mL | INTRAMUSCULAR | Status: DC | PRN
  Administered 2014-09-08: 10 mL via INTRAVENOUS
  Filled 2014-09-08: qty 10

## 2014-09-08 MED ORDER — SODIUM CHLORIDE 0.9 % IV SOLN
Freq: Once | INTRAVENOUS | Status: AC
  Administered 2014-09-08: 14:00:00 via INTRAVENOUS

## 2014-09-08 MED ORDER — TRASTUZUMAB CHEMO INJECTION 440 MG
6.0000 mg/kg | Freq: Once | INTRAVENOUS | Status: AC
  Administered 2014-09-08: 504 mg via INTRAVENOUS
  Filled 2014-09-08: qty 24

## 2014-09-08 MED ORDER — SODIUM CHLORIDE 0.9 % IJ SOLN
10.0000 mL | INTRAMUSCULAR | Status: DC | PRN
  Administered 2014-09-08: 10 mL
  Filled 2014-09-08: qty 10

## 2014-09-08 MED ORDER — HEPARIN SOD (PORK) LOCK FLUSH 100 UNIT/ML IV SOLN
500.0000 [IU] | Freq: Once | INTRAVENOUS | Status: AC | PRN
  Administered 2014-09-08: 500 [IU]
  Filled 2014-09-08: qty 5

## 2014-09-08 NOTE — Patient Instructions (Signed)
[facility] Discharge Instructions for Patients Receiving Chemotherapy  Today you received the following chemotherapy agents:  Herceptin  To help prevent nausea and vomiting after your treatment, we encourage you to take your nausea medication as ordered per MD.   If you develop nausea and vomiting that is not controlled by your nausea medication, call the clinic.   BELOW ARE SYMPTOMS THAT SHOULD BE REPORTED IMMEDIATELY:  *FEVER GREATER THAN 100.5 F  *CHILLS WITH OR WITHOUT FEVER  NAUSEA AND VOMITING THAT IS NOT CONTROLLED WITH YOUR NAUSEA MEDICATION  *UNUSUAL SHORTNESS OF BREATH  *UNUSUAL BRUISING OR BLEEDING  TENDERNESS IN MOUTH AND THROAT WITH OR WITHOUT PRESENCE OF ULCERS  *URINARY PROBLEMS  *BOWEL PROBLEMS  UNUSUAL RASH Items with * indicate a potential emergency and should be followed up as soon as possible.  Feel free to call the clinic you have any questions or concerns. The clinic phone number is [phone].    

## 2014-09-08 NOTE — Telephone Encounter (Signed)
Patient confirmed appointment for 03/01. Patient will pick up new schedule.

## 2014-09-08 NOTE — Patient Instructions (Signed)

## 2014-09-20 ENCOUNTER — Telehealth: Payer: Self-pay

## 2014-09-20 NOTE — Telephone Encounter (Signed)
Pt returned call - reports she feels a little better.  Denies temp.  Advised patient to call if temp or sign of infection.  Pt voiced understanding.

## 2014-09-20 NOTE — Telephone Encounter (Signed)
LMOVM - following up on after hours call.  Pt to return call to clinic, ask for desk RN

## 2014-09-22 ENCOUNTER — Other Ambulatory Visit: Payer: Self-pay | Admitting: Family Medicine

## 2014-09-24 ENCOUNTER — Other Ambulatory Visit: Payer: Self-pay | Admitting: *Deleted

## 2014-09-24 DIAGNOSIS — C50511 Malignant neoplasm of lower-outer quadrant of right female breast: Secondary | ICD-10-CM

## 2014-09-28 ENCOUNTER — Other Ambulatory Visit (HOSPITAL_BASED_OUTPATIENT_CLINIC_OR_DEPARTMENT_OTHER): Payer: BLUE CROSS/BLUE SHIELD

## 2014-09-28 ENCOUNTER — Ambulatory Visit (HOSPITAL_BASED_OUTPATIENT_CLINIC_OR_DEPARTMENT_OTHER): Payer: BLUE CROSS/BLUE SHIELD

## 2014-09-28 ENCOUNTER — Ambulatory Visit (HOSPITAL_BASED_OUTPATIENT_CLINIC_OR_DEPARTMENT_OTHER): Payer: BLUE CROSS/BLUE SHIELD | Admitting: Hematology and Oncology

## 2014-09-28 VITALS — BP 113/73 | HR 79 | Temp 98.2°F | Resp 18 | Ht 64.0 in | Wt 175.8 lb

## 2014-09-28 DIAGNOSIS — C50511 Malignant neoplasm of lower-outer quadrant of right female breast: Secondary | ICD-10-CM

## 2014-09-28 DIAGNOSIS — Z170 Estrogen receptor positive status [ER+]: Secondary | ICD-10-CM

## 2014-09-28 DIAGNOSIS — Z5112 Encounter for antineoplastic immunotherapy: Secondary | ICD-10-CM

## 2014-09-28 LAB — CBC WITH DIFFERENTIAL/PLATELET
BASO%: 1.4 % (ref 0.0–2.0)
Basophils Absolute: 0 10*3/uL (ref 0.0–0.1)
EOS%: 1.7 % (ref 0.0–7.0)
Eosinophils Absolute: 0 10*3/uL (ref 0.0–0.5)
HCT: 39 % (ref 34.8–46.6)
HEMOGLOBIN: 13 g/dL (ref 11.6–15.9)
LYMPH%: 41.1 % (ref 14.0–49.7)
MCH: 30.8 pg (ref 25.1–34.0)
MCHC: 33.4 g/dL (ref 31.5–36.0)
MCV: 92.2 fL (ref 79.5–101.0)
MONO#: 0.1 10*3/uL (ref 0.1–0.9)
MONO%: 6.1 % (ref 0.0–14.0)
NEUT#: 1.2 10*3/uL — ABNORMAL LOW (ref 1.5–6.5)
NEUT%: 49.7 % (ref 38.4–76.8)
Platelets: 280 10*3/uL (ref 145–400)
RBC: 4.23 10*6/uL (ref 3.70–5.45)
RDW: 11.8 % (ref 11.2–14.5)
WBC: 2.4 10*3/uL — ABNORMAL LOW (ref 3.9–10.3)
lymph#: 1 10*3/uL (ref 0.9–3.3)

## 2014-09-28 LAB — COMPREHENSIVE METABOLIC PANEL (CC13)
ALK PHOS: 85 U/L (ref 40–150)
ALT: 22 U/L (ref 0–55)
AST: 24 U/L (ref 5–34)
Albumin: 4.2 g/dL (ref 3.5–5.0)
Anion Gap: 14 mEq/L — ABNORMAL HIGH (ref 3–11)
BUN: 11.3 mg/dL (ref 7.0–26.0)
CO2: 25 mEq/L (ref 22–29)
Calcium: 9.8 mg/dL (ref 8.4–10.4)
Chloride: 103 mEq/L (ref 98–109)
Creatinine: 1.1 mg/dL (ref 0.6–1.1)
EGFR: 70 mL/min/{1.73_m2} — ABNORMAL LOW (ref >90)
Glucose: 105 mg/dl (ref 70–140)
Potassium: 3.4 mEq/L — ABNORMAL LOW (ref 3.5–5.1)
Sodium: 142 mEq/L (ref 136–145)
Total Bilirubin: 0.48 mg/dL (ref 0.20–1.20)
Total Protein: 8 g/dL (ref 6.4–8.3)

## 2014-09-28 MED ORDER — ACETAMINOPHEN 325 MG PO TABS
650.0000 mg | ORAL_TABLET | Freq: Once | ORAL | Status: AC
  Administered 2014-09-28: 650 mg via ORAL

## 2014-09-28 MED ORDER — ACETAMINOPHEN 325 MG PO TABS
ORAL_TABLET | ORAL | Status: AC
  Filled 2014-09-28: qty 2

## 2014-09-28 MED ORDER — DIPHENHYDRAMINE HCL 25 MG PO CAPS
ORAL_CAPSULE | ORAL | Status: AC
  Filled 2014-09-28: qty 1

## 2014-09-28 MED ORDER — DIPHENHYDRAMINE HCL 25 MG PO CAPS
25.0000 mg | ORAL_CAPSULE | Freq: Once | ORAL | Status: AC
  Administered 2014-09-28: 25 mg via ORAL

## 2014-09-28 MED ORDER — SODIUM CHLORIDE 0.9 % IJ SOLN
10.0000 mL | INTRAMUSCULAR | Status: DC | PRN
  Administered 2014-09-28: 10 mL
  Filled 2014-09-28: qty 10

## 2014-09-28 MED ORDER — SODIUM CHLORIDE 0.9 % IV SOLN
Freq: Once | INTRAVENOUS | Status: AC
  Administered 2014-09-28: 12:00:00 via INTRAVENOUS

## 2014-09-28 MED ORDER — TRASTUZUMAB CHEMO INJECTION 440 MG
6.0000 mg/kg | Freq: Once | INTRAVENOUS | Status: AC
  Administered 2014-09-28: 504 mg via INTRAVENOUS
  Filled 2014-09-28: qty 24

## 2014-09-28 MED ORDER — HEPARIN SOD (PORK) LOCK FLUSH 100 UNIT/ML IV SOLN
500.0000 [IU] | Freq: Once | INTRAVENOUS | Status: AC | PRN
  Administered 2014-09-28: 500 [IU]
  Filled 2014-09-28: qty 5

## 2014-09-28 NOTE — Progress Notes (Signed)
Per Dr. Lindi Adie, [place] to treat with CBC of 09/28/14.

## 2014-09-28 NOTE — Addendum Note (Signed)
Addended by: Prentiss Bells on: 09/28/2014 11:55 AM   Modules accepted: Medications

## 2014-09-28 NOTE — Assessment & Plan Note (Addendum)
right breast T2N0 IIA invasive ductal carcinoma, grade 3, ER 7% PR 6%, Ki-67 70%, HER-2/neu positive. Status post neoadjuvant [place] for just started 10/02/2013 4 cycles stopped due to neuropathy and fluid retention, gemcitabine carboplatin Herceptin Perjeta 2 with Zoladex to 28 days status post lumpectomy 03/01/2014 residual 0.6 cm tumor grade 3. Completed radiation therapy November 2015  Current therapy: Anastrozole 1 mg daily and continuation of Herceptin maintenance to complete 10/20/2014 Anastrozole toxicities: 1. Hot flashes: Improved with Effexor 2. Mild fatigue but patient is able to exercise for 45 minutes on cardio 3. Constipation related to calcium supplementation: I recommended Colace and encouraged her to drink more water.  Breast cancer surveillance: 1. Mammogram 09/01/2014 is normal 2. Breast exam 09/28/2014 is normal  Survivorship: Patient has changed her diet and eat more fruits and vegetables and is exercising and staying active. I encouraged her to take turmeric in her diet or as a supplement.   Return to clinic in 6 months for follow-up.

## 2014-09-28 NOTE — Patient Instructions (Signed)
[facility] Discharge Instructions for Patients Receiving Chemotherapy  Today you received the following chemotherapy agent: Herceptin   To help prevent nausea and vomiting after your treatment, we encourage you to take your nausea medication as prescribed.    If you develop nausea and vomiting that is not controlled by your nausea medication, call the clinic.   BELOW ARE SYMPTOMS THAT SHOULD BE REPORTED IMMEDIATELY:  *FEVER GREATER THAN 100.5 F  *CHILLS WITH OR WITHOUT FEVER  NAUSEA AND VOMITING THAT IS NOT CONTROLLED WITH YOUR NAUSEA MEDICATION  *UNUSUAL SHORTNESS OF BREATH  *UNUSUAL BRUISING OR BLEEDING  TENDERNESS IN MOUTH AND THROAT WITH OR WITHOUT PRESENCE OF ULCERS  *URINARY PROBLEMS  *BOWEL PROBLEMS  UNUSUAL RASH Items with * indicate a potential emergency and should be followed up as soon as possible.  Feel free to call the clinic you have any questions or concerns. The clinic phone number is [phone].    

## 2014-09-28 NOTE — Progress Notes (Signed)
Patient Care Team: [name], DO as PCP - General (Family Medicine) [name], MD as Consulting Physician (Emergency Medicine) [name], MD as Consulting Physician (Hematology and Oncology) [name], MD as Consulting Physician (Obstetrics and Gynecology)  DIAGNOSIS: Breast cancer of lower-outer quadrant of right female breast   Staging form: Breast, AJCC 7th Edition     Clinical: Stage Unknown (T2, NX, cM0) - Signed by [name], MD on 09/20/2013     Pathologic: No stage assigned - Unsigned   SUMMARY OF ONCOLOGIC HISTORY:   Breast cancer of lower-outer quadrant of right female breast   09/14/2013 Breast MRI lower outer right breast 2.2 x 1.3 x 1.5 cm Irregular mass. In the upper outer left breast there was a 7 x 5 x 8 mm indeterminate enhancing mass. Also another 9 x 7 x 6 mm indeterminant enhancing mass at 9:00 in the left breast was noted   09/15/2013 Clinical Stage T2Nx IIA invasive ductal carcinoma, grade 3, ER positive, HER-2/neu positive invasive ductal carcinoma of the right breast   10/02/2013 - 02/12/2014 Chemotherapy neoadjuvant Docetaxel, Carboplatin, Trastuzumab, Pertuzumab X 4 cycles; Taxotere stopped for fluid retention replaced with Gemcitabine 800 mg/m2; Zolodex Q 4 weeks   03/01/2014 Definitive Surgery Lumpectomy with SLN (Dr.Wakefield)   03/01/2014 Pathologic Stage ypT1b, pN0, pMX ; 0.6 cm, Grade 3, margins Neg; inv mammary cancer with heterogeneous cartilaginous differentiation (carcinosarcoma?); Er 7%, PR: 6%; Her 2: Amplified (3.55); 3 SLN Neg   04/27/2014 - 06/16/2014 Radiation Therapy adjuvant radiation therapy   07/28/2014 -  Anti-estrogen oral therapy Anastrozole 1 mg daily    CHIEF COMPLIANT: Follow-up of breast cancer on Herceptin and anastrozole  INTERVAL HISTORY: Tina [name] is a 54 year old lady with above-mentioned history of breast cancer currently on Herceptin maintenance therapy that will complete towards end of March. She is on  anastrozole and tolerating it fairly well. She takes Effexor for hot flashes which is helping her. She does complain of some muscle stiffness in the morning but other than that she is tolerating treatment fairly well.  REVIEW OF SYSTEMS:   Constitutional: Denies fevers, chills or abnormal weight loss Eyes: Denies blurriness of vision Ears, nose, mouth, throat, and face: Denies mucositis or sore throat Respiratory: Denies cough, dyspnea or wheezes Cardiovascular: Denies palpitation, chest discomfort or lower extremity swelling Gastrointestinal:  Denies nausea, heartburn or change in bowel habits Skin: Denies abnormal skin rashes Lymphatics: Denies new lymphadenopathy or easy bruising Neurological:Denies numbness, tingling or new weaknesses Behavioral/Psych: Mood is stable, no new changes  Breast:  denies any pain or lumps or nodules in either breasts All other systems were reviewed with the patient and are negative.  I have reviewed the past medical history, past surgical history, social history and family history with the patient and they are unchanged from previous note.  ALLERGIES:  is allergic to adhesive and nickel.  MEDICATIONS:  Current Outpatient Prescriptions  Medication Sig Dispense Refill  . amLODipine (NORVASC) 5 MG tablet TAKE ONE TABLET BY MOUTH ONCE DAILY 30 tablet 2  . anastrozole (ARIMIDEX) 1 MG tablet Take 1 tablet (1 mg total) by mouth daily. 90 tablet 3  . aspirin 81 MG tablet Take 81 mg by mouth daily.    Marland Kitchen b complex vitamins capsule Take 1 capsule by mouth daily.    Marland Kitchen BIOTIN 5000 PO Take 1 capsule by mouth daily.     . Calcium Carbonate-Vit D-Min (CALTRATE 600+D PLUS MINERALS PO) Take 1 tablet by mouth daily.    Marland Kitchen  Cholecalciferol (VITAMIN D3) 10000 UNITS capsule Take 10,000 Units by mouth daily.    . diphenhydrAMINE (SOMINEX) 25 MG tablet Take 25 mg by mouth at bedtime as needed for sleep.    Marland Kitchen gabapentin (NEURONTIN) 100 MG capsule TAKE THREE CAPSULES BY MOUTH  THREE TIMES DAILY 270 capsule 1  . ibuprofen (ADVIL,MOTRIN) 800 MG tablet Take 1 tablet (800 mg total) by mouth every 8 (eight) hours as needed for mild pain. 30 tablet 4  . lidocaine-prilocaine (EMLA) cream Apply topically as needed. (Patient taking differently: Apply topically as needed. Uses when she gets treatment) 30 g 6  . lisinopril-hydrochlorothiazide (PRINZIDE,ZESTORETIC) 20-12.5 MG per tablet TAKE TWO TABLETS BY MOUTH ONCE DAILY 120 tablet 0  . loratadine (CLARITIN) 10 MG tablet Take 10 mg by mouth daily.    Marland Kitchen LORazepam (ATIVAN) 0.5 MG tablet Take 1 tablet (0.5 mg total) by mouth every 6 (six) hours as needed (Nausea or vomiting). (Patient taking differently: Take 0.5 mg by mouth every 6 (six) hours as needed for anxiety. ) 30 tablet 0  . omeprazole (PRILOSEC) 40 MG capsule Take 1 capsule (40 mg total) by mouth daily. 30 capsule 2  . oxycodone (OXY-IR) 5 MG capsule Take 5 mg by mouth every 4 (four) hours as needed.    . Trastuzumab (HERCEPTIN IV) Inject into the vein.     Marland Kitchen venlafaxine XR (EFFEXOR-XR) 75 MG 24 hr capsule Take 1 capsule (75 mg total) by mouth daily with breakfast. 30 capsule 3  . VITAMIN E PO Take 1 capsule by mouth daily.      No current facility-administered medications for this visit.   Facility-Administered Medications Ordered in Other Visits  Medication Dose Route Frequency Provider Last Rate Last Dose  . 0.9 %  sodium chloride infusion   Intravenous Continuous Minette Headland, NP 1,000 mL/hr at 12/26/13 0915    . prochlorperazine (COMPAZINE) injection 10 mg  10 mg Intravenous Q6H PRN Minette Headland, NP   10 mg at 12/26/13 6578    PHYSICAL EXAMINATION: ECOG PERFORMANCE STATUS: 0 - Asymptomatic  Filed Vitals:   09/28/14 1037  BP: 113/73  Pulse: 79  Temp: 98.2 F (36.8 C)  Resp: 18   Filed Weights   09/28/14 1037  Weight: 175 lb 12.8 oz (79.742 kg)    GENERAL:alert, no distress and comfortable SKIN: skin color, texture, turgor are normal, no  rashes or significant lesions EYES: normal, Conjunctiva are pink and non-injected, sclera clear OROPHARYNX:no exudate, no erythema and lips, buccal mucosa, and tongue normal  NECK: supple, thyroid normal size, non-tender, without nodularity LYMPH:  no palpable lymphadenopathy in the cervical, axillary or inguinal LUNGS: clear to auscultation and percussion with normal breathing effort HEART: regular rate & rhythm and no murmurs and no lower extremity edema ABDOMEN:abdomen soft, non-tender and normal bowel sounds Musculoskeletal:no cyanosis of digits and no clubbing  NEURO: alert & oriented x 3 with fluent speech, no focal motor/sensory deficits  LABORATORY DATA:  I have reviewed the data as listed   Chemistry      Component Value Date/Time   NA 142 09/28/2014 1018   NA 138 09/25/2013 1320   K 3.4* 09/28/2014 1018   K 3.6* 09/25/2013 1320   CL 98 09/25/2013 1320   CL 104 06/16/2012 1303   CO2 25 09/28/2014 1018   CO2 27 09/25/2013 1320   BUN 11.3 09/28/2014 1018   BUN 9 09/25/2013 1320   CREATININE 1.1 09/28/2014 1018   CREATININE 0.82 09/25/2013 1320  Component Value Date/Time   CALCIUM 9.8 09/28/2014 1018   CALCIUM 10.0 09/25/2013 1320   ALKPHOS 85 09/28/2014 1018   ALKPHOS 42 10/04/2012 1830   AST 24 09/28/2014 1018   AST 32 10/04/2012 1830   ALT 22 09/28/2014 1018   ALT 27 10/04/2012 1830   BILITOT 0.48 09/28/2014 1018   BILITOT 0.7 10/04/2012 1830       Lab Results  Component Value Date   WBC 2.4* 09/28/2014   HGB 13.0 09/28/2014   HCT 39.0 09/28/2014   MCV 92.2 09/28/2014   PLT 280 09/28/2014   NEUTROABS 1.2* 09/28/2014     RADIOGRAPHIC STUDIES: I have personally reviewed the radiology reports and agreed with their findings. Mammogram 09/01/2014 is normal  ASSESSMENT & PLAN:  Breast cancer of lower-outer quadrant of right female breast right breast T2N0 IIA invasive ductal carcinoma, grade 3, ER 7% PR 6%, Ki-67 70%, HER-2/neu positive. Status post  neoadjuvant [place] for just started 10/02/2013 4 cycles stopped due to neuropathy and fluid retention, gemcitabine carboplatin Herceptin Perjeta 2 with Zoladex to 28 days status post lumpectomy 03/01/2014 residual 0.6 cm tumor grade 3. Completed radiation therapy November 2015  Current therapy: Anastrozole 1 mg daily and continuation of Herceptin maintenance to complete 10/20/2014 Anastrozole toxicities: 1. Hot flashes: Improved with Effexor 2. Mild fatigue but patient is able to exercise for 45 minutes on cardio 3. Constipation related to calcium supplementation: I recommended Colace and encouraged her to drink more water.  Breast cancer surveillance: 1. Mammogram 09/01/2014 is normal 2. Breast exam 09/28/2014 is normal  Survivorship: Patient has changed her diet and eat more fruits and vegetables and is exercising and staying active. I encouraged her to take turmeric in her diet or as a supplement.   Leukopenia/neutropenia: ANC 1200 I discussed with her that it is slowly but steadily improving. We can continue to watch and monitor her. We will do blood work every 6 months.  Return to clinic in 6 months for follow-up.    Orders Placed This Encounter  Procedures  . CBC with Differential    Standing Status: Future     Number of Occurrences:      Standing Expiration Date: 09/28/2015  . Comprehensive metabolic panel (Cmet) - [facility]    Standing Status: Future     Number of Occurrences:      Standing Expiration Date: 09/28/2015   The patient has a good understanding of the overall plan. she agrees with it. She will call with any problems that may develop before her next visit here.   Rulon Eisenmenger, MD

## 2014-09-29 ENCOUNTER — Ambulatory Visit: Payer: Self-pay

## 2014-10-19 ENCOUNTER — Other Ambulatory Visit: Payer: Self-pay

## 2014-10-19 DIAGNOSIS — C50511 Malignant neoplasm of lower-outer quadrant of right female breast: Secondary | ICD-10-CM

## 2014-10-20 ENCOUNTER — Telehealth: Payer: Self-pay | Admitting: Hematology and Oncology

## 2014-10-20 ENCOUNTER — Ambulatory Visit (HOSPITAL_BASED_OUTPATIENT_CLINIC_OR_DEPARTMENT_OTHER): Payer: BLUE CROSS/BLUE SHIELD | Admitting: Hematology and Oncology

## 2014-10-20 ENCOUNTER — Ambulatory Visit: Payer: BLUE CROSS/BLUE SHIELD

## 2014-10-20 ENCOUNTER — Other Ambulatory Visit (HOSPITAL_BASED_OUTPATIENT_CLINIC_OR_DEPARTMENT_OTHER): Payer: BLUE CROSS/BLUE SHIELD

## 2014-10-20 ENCOUNTER — Ambulatory Visit (HOSPITAL_BASED_OUTPATIENT_CLINIC_OR_DEPARTMENT_OTHER): Payer: BLUE CROSS/BLUE SHIELD

## 2014-10-20 VITALS — BP 127/85 | HR 86 | Temp 98.0°F | Resp 18 | Ht 64.0 in | Wt 172.0 lb

## 2014-10-20 DIAGNOSIS — M25531 Pain in right wrist: Secondary | ICD-10-CM

## 2014-10-20 DIAGNOSIS — D709 Neutropenia, unspecified: Secondary | ICD-10-CM | POA: Diagnosis not present

## 2014-10-20 DIAGNOSIS — D72819 Decreased white blood cell count, unspecified: Secondary | ICD-10-CM

## 2014-10-20 DIAGNOSIS — C50511 Malignant neoplasm of lower-outer quadrant of right female breast: Secondary | ICD-10-CM

## 2014-10-20 DIAGNOSIS — Z170 Estrogen receptor positive status [ER+]: Secondary | ICD-10-CM

## 2014-10-20 DIAGNOSIS — Z5112 Encounter for antineoplastic immunotherapy: Secondary | ICD-10-CM

## 2014-10-20 DIAGNOSIS — Z95828 Presence of other vascular implants and grafts: Secondary | ICD-10-CM

## 2014-10-20 DIAGNOSIS — N951 Menopausal and female climacteric states: Secondary | ICD-10-CM

## 2014-10-20 DIAGNOSIS — R202 Paresthesia of skin: Secondary | ICD-10-CM

## 2014-10-20 DIAGNOSIS — R530 Neoplastic (malignant) related fatigue: Secondary | ICD-10-CM

## 2014-10-20 DIAGNOSIS — K5900 Constipation, unspecified: Secondary | ICD-10-CM

## 2014-10-20 LAB — CBC WITH DIFFERENTIAL/PLATELET
BASO%: 1.1 % (ref 0.0–2.0)
BASOS ABS: 0 10*3/uL (ref 0.0–0.1)
EOS%: 2.5 % (ref 0.0–7.0)
Eosinophils Absolute: 0.1 10*3/uL (ref 0.0–0.5)
HEMATOCRIT: 34.3 % — AB (ref 34.8–46.6)
HEMOGLOBIN: 11.7 g/dL (ref 11.6–15.9)
LYMPH%: 42 % (ref 14.0–49.7)
MCH: 30.7 pg (ref 25.1–34.0)
MCHC: 34.1 g/dL (ref 31.5–36.0)
MCV: 89.8 fL (ref 79.5–101.0)
MONO#: 0.2 10*3/uL (ref 0.1–0.9)
MONO%: 9 % (ref 0.0–14.0)
NEUT#: 1 10*3/uL — ABNORMAL LOW (ref 1.5–6.5)
NEUT%: 45.4 % (ref 38.4–76.8)
Platelets: 245 10*3/uL (ref 145–400)
RBC: 3.82 10*6/uL (ref 3.70–5.45)
RDW: 12.3 % (ref 11.2–14.5)
WBC: 2.3 10*3/uL — ABNORMAL LOW (ref 3.9–10.3)
lymph#: 1 10*3/uL (ref 0.9–3.3)

## 2014-10-20 LAB — COMPREHENSIVE METABOLIC PANEL (CC13)
ALBUMIN: 3.9 g/dL (ref 3.5–5.0)
ALK PHOS: 71 U/L (ref 40–150)
ALT: 24 U/L (ref 0–55)
AST: 26 U/L (ref 5–34)
Anion Gap: 12 mEq/L — ABNORMAL HIGH (ref 3–11)
BILIRUBIN TOTAL: 0.51 mg/dL (ref 0.20–1.20)
BUN: 12.2 mg/dL (ref 7.0–26.0)
CO2: 27 meq/L (ref 22–29)
Calcium: 9.3 mg/dL (ref 8.4–10.4)
Chloride: 103 mEq/L (ref 98–109)
Creatinine: 1 mg/dL (ref 0.6–1.1)
EGFR: 74 mL/min/{1.73_m2} — ABNORMAL LOW (ref >90)
Glucose: 112 mg/dl (ref 70–140)
Potassium: 3.6 mEq/L (ref 3.5–5.1)
Sodium: 141 mEq/L (ref 136–145)
TOTAL PROTEIN: 7.4 g/dL (ref 6.4–8.3)

## 2014-10-20 MED ORDER — SODIUM CHLORIDE 0.9 % IJ SOLN
10.0000 mL | INTRAMUSCULAR | Status: DC | PRN
  Administered 2014-10-20: 10 mL via INTRAVENOUS
  Filled 2014-10-20: qty 10

## 2014-10-20 MED ORDER — ACETAMINOPHEN 325 MG PO TABS
ORAL_TABLET | ORAL | Status: AC
  Filled 2014-10-20: qty 2

## 2014-10-20 MED ORDER — DIPHENHYDRAMINE HCL 25 MG PO CAPS
25.0000 mg | ORAL_CAPSULE | Freq: Once | ORAL | Status: AC
  Administered 2014-10-20: 25 mg via ORAL

## 2014-10-20 MED ORDER — TRASTUZUMAB CHEMO INJECTION 440 MG
6.0000 mg/kg | Freq: Once | INTRAVENOUS | Status: AC
  Administered 2014-10-20: 504 mg via INTRAVENOUS
  Filled 2014-10-20: qty 24

## 2014-10-20 MED ORDER — ACETAMINOPHEN 325 MG PO TABS
650.0000 mg | ORAL_TABLET | Freq: Once | ORAL | Status: AC
  Administered 2014-10-20: 650 mg via ORAL

## 2014-10-20 MED ORDER — SODIUM CHLORIDE 0.9 % IV SOLN
Freq: Once | INTRAVENOUS | Status: AC
  Administered 2014-10-20: 13:00:00 via INTRAVENOUS

## 2014-10-20 MED ORDER — SODIUM CHLORIDE 0.9 % IJ SOLN
10.0000 mL | INTRAMUSCULAR | Status: DC | PRN
  Administered 2014-10-20: 10 mL
  Filled 2014-10-20: qty 10

## 2014-10-20 MED ORDER — DIPHENHYDRAMINE HCL 25 MG PO CAPS
ORAL_CAPSULE | ORAL | Status: AC
  Filled 2014-10-20: qty 1

## 2014-10-20 MED ORDER — HEPARIN SOD (PORK) LOCK FLUSH 100 UNIT/ML IV SOLN
500.0000 [IU] | Freq: Once | INTRAVENOUS | Status: AC | PRN
  Administered 2014-10-20: 500 [IU]
  Filled 2014-10-20: qty 5

## 2014-10-20 MED ORDER — SODIUM CHLORIDE 0.9 % IJ SOLN
10.0000 mL | INTRAMUSCULAR | Status: DC | PRN
  Filled 2014-10-20: qty 10

## 2014-10-20 NOTE — Patient Instructions (Signed)

## 2014-10-20 NOTE — Patient Instructions (Signed)
[facility] Discharge Instructions for Patients Receiving Chemotherapy  Today you received the following chemotherapy agents Herceptin.  To help prevent nausea and vomiting after your treatment, we encourage you to take your nausea medication as prescribed by your physician. If you develop nausea and vomiting that is not controlled by your nausea medication, call the clinic.   BELOW ARE SYMPTOMS THAT SHOULD BE REPORTED IMMEDIATELY:  *FEVER GREATER THAN 100.5 F  *CHILLS WITH OR WITHOUT FEVER  NAUSEA AND VOMITING THAT IS NOT CONTROLLED WITH YOUR NAUSEA MEDICATION  *UNUSUAL SHORTNESS OF BREATH  *UNUSUAL BRUISING OR BLEEDING  TENDERNESS IN MOUTH AND THROAT WITH OR WITHOUT PRESENCE OF ULCERS  *URINARY PROBLEMS  *BOWEL PROBLEMS  UNUSUAL RASH Items with * indicate a potential emergency and should be followed up as soon as possible.  Feel free to call the clinic you have any questions or concerns. The clinic phone number is [phone].  Please show the CHEMO ALERT CARD at check-in to the Emergency Department and triage nurse.   

## 2014-10-20 NOTE — Assessment & Plan Note (Signed)
He brought 2 mgright breast T2N0 IIA invasive ductal carcinoma, grade 3, ER 7% PR 6%, Ki-67 70%, HER-2/neu positive. Status post neoadjuvant [place] for just started 10/02/2013 4 cycles stopped due to neuropathy and fluid retention, gemcitabine carboplatin Herceptin Perjeta 2 with Zoladex to 28 days status post lumpectomy 03/01/2014 residual 0.6 cm tumor grade 3. Completed radiation therapy November 2015  Current therapy: Anastrozole 1 mg daily and continuation of Herceptin maintenance completed 10/20/2014 Anastrozole toxicities: 1. Hot flashes: Improved with Effexor 2. Mild fatigue but patient is able to exercise for 45 minutes on cardio 3. Constipation related to calcium supplementation: I recommended Colace and encouraged her to drink more water. 4. Musculoskeletal pain in the right wrist 5. Tingling of the scalp  Breast cancer surveillance: 1. Mammogram 09/01/2014 is normal 2. Breast exam 09/28/2014 is normal  Survivorship: Patient has changed her diet and eat more fruits and vegetables and is exercising and staying active. I encouraged her to take turmeric in her diet or as a supplement.   Leukopenia/neutropenia: ANC 1000 I discussed with her that we can watch and monitor this for now. If in 3 months her blood counts do not improve, we can set her up for a bone marrow biopsy.

## 2014-10-20 NOTE — Telephone Encounter (Signed)
per pof to sch pt appt-gave pt copy of sch °

## 2014-10-20 NOTE — Progress Notes (Signed)
Patient Care Team: [name], DO as PCP - General (Family Medicine) [name], MD as Consulting Physician (Emergency Medicine) [name], MD as Consulting Physician (Hematology and Oncology) [name], MD as Consulting Physician (Obstetrics and Gynecology)  DIAGNOSIS: Breast cancer of lower-outer quadrant of right female breast   Staging form: Breast, AJCC 7th Edition     Clinical: Stage Unknown (T2, NX, cM0) - Signed by [name], MD on 09/20/2013     Pathologic: No stage assigned - Unsigned   SUMMARY OF ONCOLOGIC HISTORY:   Breast cancer of lower-outer quadrant of right female breast   09/14/2013 Breast MRI lower outer right breast 2.2 x 1.3 x 1.5 cm Irregular mass. In the upper outer left breast there was a 7 x 5 x 8 mm indeterminate enhancing mass. Also another 9 x 7 x 6 mm indeterminant enhancing mass at 9:00 in the left breast was noted   09/15/2013 Clinical Stage T2Nx IIA invasive ductal carcinoma, grade 3, ER positive, HER-2/neu positive invasive ductal carcinoma of the right breast   10/02/2013 - 02/12/2014 Chemotherapy neoadjuvant Docetaxel, Carboplatin, Trastuzumab, Pertuzumab X 4 cycles; Taxotere stopped for fluid retention replaced with Gemcitabine 800 mg/m2; Zolodex Q 4 weeks   03/01/2014 Definitive Surgery Lumpectomy with SLN (Dr.Wakefield)   03/01/2014 Pathologic Stage ypT1b, pN0, pMX ; 0.6 cm, Grade 3, margins Neg; inv mammary cancer with heterogeneous cartilaginous differentiation (carcinosarcoma?); Er 7%, PR: 6%; Her 2: Amplified (3.55); 3 SLN Neg   04/27/2014 - 06/16/2014 Radiation Therapy adjuvant radiation therapy   07/28/2014 -  Anti-estrogen oral therapy Anastrozole 1 mg daily    CHIEF COMPLIANT: Last Herceptin treatment  INTERVAL HISTORY: Tina [name] is a 54 year old African American lady with above-mentioned history of right breast cancer treated with lumpectomy radiation and is currently on anastrozole. Today she finishes adjuvant Herceptin  treatment. She is super excited about it. She complains of anastrozole is causing musculoskeletal aches especially in the right wrist. Also complains of scalp tingling. Denies any fevers or chills.  REVIEW OF SYSTEMS:   Constitutional: Denies fevers, chills or abnormal weight loss Eyes: Denies blurriness of vision Ears, nose, mouth, throat, and face: Denies mucositis or sore throat Respiratory: Denies cough, dyspnea or wheezes Cardiovascular: Denies palpitation, chest discomfort or lower extremity swelling Gastrointestinal:  Denies nausea, heartburn or change in bowel habits Skin: Denies abnormal skin rashes Lymphatics: Denies new lymphadenopathy or easy bruising Neurological:Denies numbness, tingling or new weaknesses Behavioral/Psych: Mood is stable, no new changes  Breast:  denies any pain or lumps or nodules in either breasts All other systems were reviewed with the patient and are negative.  I have reviewed the past medical history, past surgical history, social history and family history with the patient and they are unchanged from previous note.  ALLERGIES:  is allergic to adhesive and nickel.  MEDICATIONS:  Current Outpatient Prescriptions  Medication Sig Dispense Refill  . amLODipine (NORVASC) 5 MG tablet TAKE ONE TABLET BY MOUTH ONCE DAILY 30 tablet 2  . anastrozole (ARIMIDEX) 1 MG tablet Take 1 tablet (1 mg total) by mouth daily. 90 tablet 3  . aspirin 81 MG tablet Take 81 mg by mouth daily.    Marland Kitchen b complex vitamins capsule Take 1 capsule by mouth daily.    Marland Kitchen BIOTIN 5000 PO Take 1 capsule by mouth daily.     . Calcium Carbonate-Vit D-Min (CALTRATE 600+D PLUS MINERALS PO) Take 1 tablet by mouth daily.    . Cholecalciferol (VITAMIN D3) 10000 UNITS  capsule Take 10,000 Units by mouth daily.    . diphenhydrAMINE (SOMINEX) 25 MG tablet Take 25 mg by mouth at bedtime as needed for sleep.    Marland Kitchen gabapentin (NEURONTIN) 100 MG capsule TAKE THREE CAPSULES BY MOUTH THREE TIMES DAILY 270  capsule 1  . ibuprofen (ADVIL,MOTRIN) 800 MG tablet Take 1 tablet (800 mg total) by mouth every 8 (eight) hours as needed for mild pain. 30 tablet 4  . lidocaine-prilocaine (EMLA) cream Apply topically as needed. (Patient taking differently: Apply topically as needed. Uses when she gets treatment) 30 g 6  . lisinopril-hydrochlorothiazide (PRINZIDE,ZESTORETIC) 20-12.5 MG per tablet TAKE TWO TABLETS BY MOUTH ONCE DAILY 120 tablet 0  . loratadine (CLARITIN) 10 MG tablet Take 10 mg by mouth daily.    Marland Kitchen LORazepam (ATIVAN) 0.5 MG tablet Take 1 tablet (0.5 mg total) by mouth every 6 (six) hours as needed (Nausea or vomiting). (Patient taking differently: Take 0.5 mg by mouth every 6 (six) hours as needed for anxiety. ) 30 tablet 0  . omeprazole (PRILOSEC) 40 MG capsule Take 1 capsule (40 mg total) by mouth daily. 30 capsule 2  . oxycodone (OXY-IR) 5 MG capsule Take 5 mg by mouth every 4 (four) hours as needed.    . Trastuzumab (HERCEPTIN IV) Inject into the vein.     Marland Kitchen venlafaxine XR (EFFEXOR-XR) 75 MG 24 hr capsule Take 1 capsule (75 mg total) by mouth daily with breakfast. 30 capsule 3  . VITAMIN E PO Take 1 capsule by mouth daily.      No current facility-administered medications for this visit.   Facility-Administered Medications Ordered in Other Visits  Medication Dose Route Frequency Provider Last Rate Last Dose  . 0.9 %  sodium chloride infusion   Intravenous Continuous Minette Headland, NP 1,000 mL/hr at 12/26/13 0915    . prochlorperazine (COMPAZINE) injection 10 mg  10 mg Intravenous Q6H PRN Minette Headland, NP   10 mg at 12/26/13 3810    PHYSICAL EXAMINATION: ECOG PERFORMANCE STATUS: 1 - Symptomatic but completely ambulatory  Filed Vitals:   10/20/14 1131  BP: 127/85  Pulse: 86  Temp: 98 F (36.7 C)  Resp: 18   Filed Weights   10/20/14 1131  Weight: 172 lb (78.019 kg)    GENERAL:alert, no distress and comfortable SKIN: skin color, texture, turgor are normal, no rashes or  significant lesions EYES: normal, Conjunctiva are pink and non-injected, sclera clear OROPHARYNX:no exudate, no erythema and lips, buccal mucosa, and tongue normal  NECK: supple, thyroid normal size, non-tender, without nodularity LYMPH:  no palpable lymphadenopathy in the cervical, axillary or inguinal LUNGS: clear to auscultation and percussion with normal breathing effort HEART: regular rate & rhythm and no murmurs and no lower extremity edema ABDOMEN:abdomen soft, non-tender and normal bowel sounds Musculoskeletal:no cyanosis of digits and no clubbing  NEURO: alert & oriented x 3 with fluent speech, no focal motor/sensory deficits  LABORATORY DATA:  I have reviewed the data as listed   Chemistry      Component Value Date/Time   NA 141 10/20/2014 1112   NA 138 09/25/2013 1320   K 3.6 10/20/2014 1112   K 3.6* 09/25/2013 1320   CL 98 09/25/2013 1320   CL 104 06/16/2012 1303   CO2 27 10/20/2014 1112   CO2 27 09/25/2013 1320   BUN 12.2 10/20/2014 1112   BUN 9 09/25/2013 1320   CREATININE 1.0 10/20/2014 1112   CREATININE 0.82 09/25/2013 1320  Component Value Date/Time   CALCIUM 9.3 10/20/2014 1112   CALCIUM 10.0 09/25/2013 1320   ALKPHOS 71 10/20/2014 1112   ALKPHOS 42 10/04/2012 1830   AST 26 10/20/2014 1112   AST 32 10/04/2012 1830   ALT 24 10/20/2014 1112   ALT 27 10/04/2012 1830   BILITOT 0.51 10/20/2014 1112   BILITOT 0.7 10/04/2012 1830       Lab Results  Component Value Date   WBC 2.3* 10/20/2014   HGB 11.7 10/20/2014   HCT 34.3* 10/20/2014   MCV 89.8 10/20/2014   PLT 245 10/20/2014   NEUTROABS 1.0* 10/20/2014    ASSESSMENT & PLAN:  Breast cancer of lower-outer quadrant of right female breast He brought 2 mgright breast T2N0 IIA invasive ductal carcinoma, grade 3, ER 7% PR 6%, Ki-67 70%, HER-2/neu positive. Status post neoadjuvant [place] for just started 10/02/2013 4 cycles stopped due to neuropathy and fluid retention, gemcitabine carboplatin Herceptin  Perjeta 2 with Zoladex to 28 days status post lumpectomy 03/01/2014 residual 0.6 cm tumor grade 3. Completed radiation therapy November 2015  Current therapy: Anastrozole 1 mg daily and continuation of Herceptin maintenance completed 10/20/2014 Anastrozole toxicities: 1. Hot flashes: Improved with Effexor 2. Mild fatigue but patient is able to exercise for 45 minutes on cardio 3. Constipation related to calcium supplementation: I recommended Colace and encouraged her to drink more water. 4. Musculoskeletal pain in the right wrist 5. Tingling of the scalp  Breast cancer surveillance: 1. Mammogram 09/01/2014 is normal 2. Breast exam 09/28/2014 is normal  Survivorship: Patient has changed her diet and eat more fruits and vegetables and is exercising and staying active. I encouraged her to take turmeric in her diet or as a supplement.   Leukopenia/neutropenia: ANC 1000 I discussed with her that we can watch and monitor this for now. If in 3 months her blood counts do not improve, we can set her up for a bone marrow biopsy.    Orders Placed This Encounter  Procedures  . DG Bone Density    Standing Status: Future     Number of Occurrences:      Standing Expiration Date: 10/20/2015    Order Specific Question:  Reason for Exam (SYMPTOM  OR DIAGNOSIS REQUIRED)    Answer:  estrogen deficiency    Order Specific Question:  Is the patient pregnant?    Answer:  No    Order Specific Question:  Preferred imaging location?    Answer:  [facility]  . CBC with Differential    Standing Status: Future     Number of Occurrences:      Standing Expiration Date: 10/20/2015  . Comprehensive metabolic panel (Cmet) - [facility]    Standing Status: Future     Number of Occurrences:      Standing Expiration Date: 10/20/2015   The patient has a good understanding of the overall plan. she agrees with it. She will call with any problems that may develop before her next visit here.   Rulon Eisenmenger,  MD

## 2014-10-26 ENCOUNTER — Encounter: Payer: Self-pay | Admitting: Family Medicine

## 2014-10-26 ENCOUNTER — Ambulatory Visit (INDEPENDENT_AMBULATORY_CARE_PROVIDER_SITE_OTHER): Payer: BLUE CROSS/BLUE SHIELD | Admitting: Family Medicine

## 2014-10-26 ENCOUNTER — Telehealth: Payer: Self-pay | Admitting: Family Medicine

## 2014-10-26 VITALS — BP 100/72 | HR 85 | Temp 98.1°F | Ht 64.0 in | Wt 169.2 lb

## 2014-10-26 DIAGNOSIS — N951 Menopausal and female climacteric states: Secondary | ICD-10-CM | POA: Diagnosis not present

## 2014-10-26 DIAGNOSIS — C50511 Malignant neoplasm of lower-outer quadrant of right female breast: Secondary | ICD-10-CM

## 2014-10-26 DIAGNOSIS — G4733 Obstructive sleep apnea (adult) (pediatric): Secondary | ICD-10-CM

## 2014-10-26 DIAGNOSIS — E785 Hyperlipidemia, unspecified: Secondary | ICD-10-CM | POA: Diagnosis not present

## 2014-10-26 DIAGNOSIS — R232 Flushing: Secondary | ICD-10-CM

## 2014-10-26 DIAGNOSIS — F411 Generalized anxiety disorder: Secondary | ICD-10-CM

## 2014-10-26 DIAGNOSIS — I10 Essential (primary) hypertension: Secondary | ICD-10-CM | POA: Diagnosis not present

## 2014-10-26 DIAGNOSIS — Z114 Encounter for screening for human immunodeficiency virus [HIV]: Secondary | ICD-10-CM

## 2014-10-26 LAB — LIPID PANEL
CHOLESTEROL: 229 mg/dL — AB (ref 0–200)
HDL: 55.3 mg/dL (ref >39.00)
NonHDL: 173.7
Total CHOL/HDL Ratio: 4
Triglycerides: 229 mg/dL — ABNORMAL HIGH (ref 0.0–149.0)
VLDL: 45.8 mg/dL — AB (ref 0.0–40.0)

## 2014-10-26 LAB — LDL CHOLESTEROL, DIRECT: Direct LDL: 123 mg/dL

## 2014-10-26 NOTE — Progress Notes (Signed)
Pre visit review using our clinic review tool, if applicable. No additional management support is needed unless otherwise documented below in the visit note. 

## 2014-10-26 NOTE — Telephone Encounter (Signed)
emmi emailed °

## 2014-10-26 NOTE — Progress Notes (Signed)
HPI:  Hot flashes/anxiety: -started right after oophorectomy -effexor helped and dose increased 08/2014 -reports: doing well, hot flashes imprved -denies: sig sedation, worsening anxiety, depression, SI  HTN/HLD: -lisinopril-hctz40-25, norvasc 5 mg  -denies: CP, SOB, DOE, swelling, palpitations -live strong exercise program; doing a healthy diet - has made sig changes -FASTING today for her cholesterol panel  ? OSA: -snores at night and sometimes husband says stops breathing -feels like she rests pretty well, without daytime somnolence -sleep study ordered last visit and this is scheduled  Breast Ca: -s/p surgery, chemo and radiation -followed by onc for Herceptin and Anastrazole - followed by cardio-onc clinic as well  Colon Ca screening: she is waiting to see where she is going with her breast ca treatments and then plans to schedule this.  ROS: See pertinent positives and negatives per HPI.  Past Medical History  Diagnosis Date  . Leukocytopenia   . Hypertension   . Leukopenia 06/16/2013  . Needs flu shot 06/17/2013    resolved - flu shot recvd in 04/2014  . Cancer of right breast 09/03/13    Invasive Ductal Carcinoma/Ductal Carcinoma Insitu  biopsies  . Anxiety   . GERD (gastroesophageal reflux disease)   . Degenerative joint disease     back neck and shoulder  . 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 S/P radiation therapy 04/27/2014-06/07/2014    1) Right breast, 50 Gy in 25 fractions/ 2) Right breast boost, 10 Gy in 5 fractions    Past Surgical History  Procedure Laterality Date  . Cesarean section      x 3  . Carpal tunnel release  1998    rt  . Trigger finger release  2004    rt thumb  . Portacath placement  08/2013    still currently active   . Tubal ligation    . Trigger finger release left thumb  06/28/14    at [facility]  . Wisdom tooth extraction    . Breast surgery  03/01/2014    hx right breast cancer- 3  lymph nodes removed and lumpectomy  . Robotic assisted bilateral salpingo oopherectomy Bilateral 07/09/2014    Procedure: ROBOTIC ASSISTED BILATERAL SALPINGO OOPHORECTOM; :LYSIS OF ADHESIONS;  Surgeon: Marvene Staff, MD;  Location: [facility];  Service: Gynecology;  Laterality: Bilateral;    Family History  Problem Relation Age of Onset  . Hypertension Mother   . Dementia Mother     small vessel disease  . Cancer Mother 30    cervical  cancer  . Cancer Father 29    prostate ca  . Diabetes Father   . Arthritis Father     History   Social History  . Marital Status: Married    Spouse Name: N/A  . Number of Children: 1  . Years of Education: N/A   Occupational History  .      homemaker   Social History Main Topics  . Smoking status: Former Smoker -- 1.00 packs/day for 15 years    Types: Cigars, Cigarettes    Quit date: 02/23/2004  . Smokeless tobacco: Never Used     Comment: remote smoking history  . Alcohol Use: 0.0 oz/week    0 Shots of liquor per week     Comment: occ   . Drug Use: Yes     Comment: Marjuana  . Sexual Activity: Yes   Other Topics Concern  . 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  amLODipine (NORVASC) 5 MG tablet, TAKE ONE TABLET BY MOUTH ONCE DAILY, Disp: 30 tablet, Rfl: 2 .  anastrozole (ARIMIDEX) 1 MG tablet, Take 1 tablet (1 mg total) by mouth daily., Disp: 90 tablet, Rfl: 3 .  aspirin 81 MG tablet, Take 81 mg by mouth daily., Disp: , Rfl:  .  b complex vitamins capsule, Take 1 capsule by mouth daily., Disp: , Rfl:  .  BIOTIN 5000 PO, Take 1 capsule by mouth daily. , Disp: , Rfl:  .  Calcium Carbonate-Vit D-Min (CALTRATE 600+D PLUS MINERALS PO), Take 1 tablet by mouth daily.,  Disp: , Rfl:  .  Cholecalciferol (VITAMIN D3) 10000 UNITS capsule, Take 10,000 Units by mouth daily., Disp: , Rfl:  .  diphenhydrAMINE (SOMINEX) 25 MG tablet, Take 25 mg by mouth at bedtime as needed for sleep., Disp: , Rfl:  .  gabapentin (NEURONTIN) 100 MG capsule, TAKE THREE CAPSULES BY MOUTH THREE TIMES DAILY, Disp: 270 capsule, Rfl: 1 .  ibuprofen (ADVIL,MOTRIN) 800 MG tablet, Take 1 tablet (800 mg total) by mouth every 8 (eight) hours as needed for mild pain., Disp: 30 tablet, Rfl: 4 .  lidocaine-prilocaine (EMLA) cream, Apply topically as needed. (Patient taking differently: Apply topically as needed. Uses when she gets treatment), Disp: 30 g, Rfl: 6 .  lisinopril-hydrochlorothiazide (PRINZIDE,ZESTORETIC) 20-12.5 MG per tablet, TAKE TWO TABLETS BY MOUTH ONCE DAILY, Disp: 120 tablet, Rfl: 0 .  loratadine (CLARITIN) 10 MG tablet, Take 10 mg by mouth daily., Disp: , Rfl:  .  LORazepam (ATIVAN) 0.5 MG tablet, Take 1 tablet (0.5 mg total) by mouth every 6 (six) hours as needed (Nausea or vomiting). (Patient taking differently: Take 0.5 mg by mouth every 6 (six) hours as needed for anxiety. ), Disp: 30 tablet, Rfl: 0 .  omeprazole (PRILOSEC) 40 MG capsule, Take 1 capsule (40 mg total) by mouth daily., Disp: 30 capsule, Rfl: 2 .  oxycodone (OXY-IR) 5 MG capsule, Take 5 mg by mouth every 4 (four) hours as needed., Disp: , Rfl:  .  Trastuzumab (HERCEPTIN IV), Inject into the vein. , Disp: , Rfl:  .  venlafaxine XR (EFFEXOR-XR) 75 MG 24 hr capsule, Take 1 capsule (75 mg total) by mouth daily with breakfast., Disp: 30 capsule, Rfl: 3 .  VITAMIN E PO, Take 1 capsule by mouth daily. , Disp: , Rfl:  No current facility-administered medications for this visit.  Facility-Administered Medications Ordered in Other Visits:  .  0.9 %  sodium chloride infusion, , Intravenous, Continuous, Minette Headland, NP, Last Rate: 1,000 mL/hr at 12/26/13 0915 .  prochlorperazine (COMPAZINE) injection 10 mg, 10 mg,  Intravenous, Q6H PRN, Minette Headland, NP, 10 mg at 12/26/13 0917  EXAM:  Filed Vitals:   10/26/14 1058  BP: 100/72  Pulse: 85  Temp: 98.1 F (36.7 C)    Body mass index is 29.03 kg/(m^2).  GENERAL: vitals reviewed and listed above, alert, oriented, appears well hydrated and in no acute distress  HEENT: atraumatic, conjunttiva clear, no obvious abnormalities on inspection of external nose and ears  NECK: no obvious masses on inspection  LUNGS: clear to auscultation bilaterally, no wheezes, rales or rhonchi, good air movement  CV: HRRR, no peripheral edema  MS: moves all extremities without noticeable abnormality  PSYCH: pleasant and cooperative, no obvious depression or anxiety  ASSESSMENT AND PLAN:  Discussed the following assessment and plan:  Hot flashes GAD (generalized anxiety disorder) -doing well and improved on current effexor dose without side effects  Essential hypertension -stable  ? OSA (obstructive sleep apnea) -waiting on sleep study  Breast cancer of lower-outer quadrant of right female breast -managed by her oncologist  Hyperlipemia - Plan: Lipid Panel -check FASTING lipids today  Screening for HIV (human immunodeficiency virus) - Plan: HIV antibody (with reflex)  -Patient advised to return or notify a doctor immediately if symptoms worsen or persist or new concerns arise.  There are no Patient Instructions on file for this visit.   Colin Benton R.

## 2014-10-26 NOTE — Patient Instructions (Signed)
BEFORE YOU LEAVE: -labs -follow up in 3-4 months

## 2014-10-27 LAB — HIV ANTIBODY (ROUTINE TESTING W REFLEX): HIV 1&2 Ab, 4th Generation: NONREACTIVE

## 2014-11-02 ENCOUNTER — Other Ambulatory Visit: Payer: Self-pay | Admitting: Family Medicine

## 2014-11-07 ENCOUNTER — Ambulatory Visit (HOSPITAL_BASED_OUTPATIENT_CLINIC_OR_DEPARTMENT_OTHER): Payer: BLUE CROSS/BLUE SHIELD | Attending: Family Medicine

## 2014-11-07 VITALS — Ht 64.0 in | Wt 167.0 lb

## 2014-11-07 DIAGNOSIS — G4730 Sleep apnea, unspecified: Secondary | ICD-10-CM | POA: Diagnosis not present

## 2014-11-07 DIAGNOSIS — R0683 Snoring: Secondary | ICD-10-CM | POA: Diagnosis not present

## 2014-11-07 DIAGNOSIS — R400 Somnolence: Secondary | ICD-10-CM

## 2014-11-07 DIAGNOSIS — G4710 Hypersomnia, unspecified: Secondary | ICD-10-CM | POA: Insufficient documentation

## 2014-11-09 ENCOUNTER — Telehealth: Payer: Self-pay | Admitting: Hematology and Oncology

## 2014-11-09 ENCOUNTER — Telehealth: Payer: Self-pay | Admitting: *Deleted

## 2014-11-09 ENCOUNTER — Other Ambulatory Visit: Payer: Self-pay | Admitting: *Deleted

## 2014-11-09 NOTE — Telephone Encounter (Signed)
Patient called in to check on a hercep appointment that was scheduled.  Her last treatment was 3/23 and this one was scheduled back in January.  Per Dr Lindi Adie she is done with this treatment.  She also questioned her  dexa appointment and she will call for this.  While looking at the orders i noticed a echo order but per diana/inbasket  and dr Oval Linsey this is not needed.  Pt notified    anne

## 2014-11-09 NOTE — Telephone Encounter (Signed)
"  I'm calling for a referral with the pelvic floor specialist and the [facility] ...whatever it's called." [phone] called [phone] and left voicemail requesting return call for clarification of second referral request.  Awaiting return call.

## 2014-11-10 ENCOUNTER — Ambulatory Visit: Payer: Self-pay

## 2014-11-10 NOTE — Telephone Encounter (Signed)
Return call received.  Patient says she attended a class and learned a referral can be made for "chemo brain" for cogniscence.  Admits to memory deficits.  Takes anastrozole and would like to change due to pain in hands, wrist and feet.  Trying to tolerate this but it is getting worse.  Will notify provider.  Next F/U is 01-24-2015 with Dr. Lindi Adie.  Return number [phone]

## 2014-11-11 NOTE — Telephone Encounter (Signed)
The speaker who came here to give the talk is from [place]. [facility] may have her info. Please call them and pass it on to her to call and make the appt VG

## 2014-11-12 NOTE — Telephone Encounter (Signed)
No one at the [employer] outside [facility]  11-12-2014 at 1623 voicemail left for [employer] at 08-25.  Request they call Dr. Algis Downs collaborative nurse with this information.

## 2014-11-13 DIAGNOSIS — R0683 Snoring: Secondary | ICD-10-CM | POA: Diagnosis not present

## 2014-11-13 DIAGNOSIS — G4710 Hypersomnia, unspecified: Secondary | ICD-10-CM | POA: Diagnosis not present

## 2014-11-13 NOTE — Sleep Study (Signed)
   NAME: Tina [name] DATE OF BIRTH:  [birth year] MEDICAL RECORD NUMBER [id number]  LOCATION: [facility]  PHYSICIAN: Jarely Juncaj D  DATE OF STUDY: 11/07/2014  SLEEP STUDY TYPE: Nocturnal Polysomnogram               REFERRING PHYSICIAN: [name], DO  INDICATION FOR STUDY: Hypersomnia with sleep apnea  EPWORTH SLEEPINESS SCORE:   8/24 HEIGHT: 5\' 4"  (162.6 cm)  WEIGHT: 167 lb (75.751 kg)    Body mass index is 28.65 kg/(m^2).  NECK SIZE: 14 in.  MEDICATIONS: Charted for review  SLEEP ARCHITECTURE: Total sleep time 249 minutes with sleep efficiency 64.9%. Stage I was 19.7%, stage II 80.3%, stages 3 and REM were absent. Sleep latency 60.5 minutes, awake after sleep onset 74 minutes, arousal index 11.3, bedtime medication: None  RESPIRATORY DATA: Apnea hypopnea index (AHI) 1.2 per hour. 5 total events scored including one obstructive apnea and 4 hypopneas. Most events were while supine. CPAP titration was not done.  OXYGEN DATA: Loud snoring with oxygen desaturation to a nadir of 90% and mean saturation 96% on room air  CARDIAC DATA: Sinus rhythm with rare PVC  MOVEMENT/PARASOMNIA: No significant movement disturbance, no bathroom trips  IMPRESSION/ RECOMMENDATION:   1) Sleep onset delayed until 11:30 PM. Through the rest of the night sleep was fragmented by frequent spontaneous awakenings unrelated to respiratory or movement disturbance. If this pattern is typical of her home environment, then management for insomnia may be helpful. Deeper sleep stages including N3 and REM were absent. 2) Occasional respiratory event with sleep disturbance, within normal limits. AHI 1.2 per hour. The normal range for adults is an AHI from 0-5 events per hour. Snoring was loud with oxygen desaturation to a nadir of 90% and mean saturation 96% on room air.   Deneise Lever Diplomate, American Board of Sleep Medicine  ELECTRONICALLY SIGNED ON:  11/13/2014, 9:45 AM [place] PH: [phone]   FX: [phone] [place]

## 2014-11-15 ENCOUNTER — Telehealth: Payer: Self-pay | Admitting: Adult Health

## 2014-11-15 NOTE — Telephone Encounter (Signed)
I spoke with Ms. Cangemi to schedule her Survivorship Clinic as she requested when I met her several weeks ago in the [facility] course series.  She recently completed her last maintenance Herceptin a few weeks ago and is excited to be seen in survivorship.   I have her scheduled to come see me this Thursday, 11/18/14.  I gave her instructions on where to check in for this appt., as well as my direct number to call with any questions/concerns before that time.  I look forward to participating in her care!  Mike Craze, NP [place] [phone]

## 2014-11-16 ENCOUNTER — Telehealth: Payer: Self-pay | Admitting: *Deleted

## 2014-11-16 ENCOUNTER — Ambulatory Visit
Admission: RE | Admit: 2014-11-16 | Discharge: 2014-11-16 | Disposition: A | Discharge status: Home / Self Care | Payer: BLUE CROSS/BLUE SHIELD | Source: Ambulatory Visit | Attending: Hematology and Oncology | Admitting: Hematology and Oncology

## 2014-11-16 DIAGNOSIS — C50511 Malignant neoplasm of lower-outer quadrant of right female breast: Secondary | ICD-10-CM

## 2014-11-16 MED ORDER — TAMOXIFEN CITRATE 20 MG PO TABS: 20.0000 mg | ORAL_TABLET | Freq: Every day | ORAL | Status: DC

## 2014-11-16 NOTE — Telephone Encounter (Signed)
Will close this encounter as I have noted an appointment with Mike Craze NP with Survivorship Clinic.

## 2014-11-16 NOTE — Telephone Encounter (Signed)
TC from patient requesting a change in her Anastrozole.  She states this medication is causing pain in her hands and feet and wants to be on something else. Please call patient with this information/prescription. She had called last week about this.

## 2014-11-16 NOTE — Telephone Encounter (Addendum)
Let pt know Dr Lindi Adie is changing her to tamoxifen - prescription will be sent to [facility].  Pt voiced understanding.  Receipt confirmed by pharmacy.

## 2014-11-16 NOTE — Telephone Encounter (Signed)
Let pt know we would send referral for Tina [name] - neuropsych.    Patient reports hands and feet are very painful - requesting change of medication.  Let her know I would discuss with Dr. Lindi Adie and call her back.  Pt voiced understanding.

## 2014-11-16 NOTE — Addendum Note (Signed)
Addended by: Prentiss Bells on: 11/16/2014 04:15 PM   Modules accepted: Orders

## 2014-11-17 ENCOUNTER — Telehealth: Payer: Self-pay | Admitting: Hematology and Oncology

## 2014-11-17 ENCOUNTER — Other Ambulatory Visit: Payer: Self-pay

## 2014-11-17 ENCOUNTER — Telehealth: Payer: Self-pay | Admitting: Family Medicine

## 2014-11-17 DIAGNOSIS — C50511 Malignant neoplasm of lower-outer quadrant of right female breast: Secondary | ICD-10-CM

## 2014-11-17 NOTE — Telephone Encounter (Signed)
Patient would like the results to her sleep study.

## 2014-11-17 NOTE — Telephone Encounter (Signed)
Spoke with beth in triage and she is aware to change referral to neuro rehab to fall into their work q

## 2014-11-18 ENCOUNTER — Encounter: Payer: Self-pay | Admitting: Adult Health

## 2014-11-18 ENCOUNTER — Ambulatory Visit (HOSPITAL_BASED_OUTPATIENT_CLINIC_OR_DEPARTMENT_OTHER): Payer: BLUE CROSS/BLUE SHIELD | Admitting: Adult Health

## 2014-11-18 VITALS — BP 142/91 | HR 72 | Temp 98.2°F | Resp 16

## 2014-11-18 DIAGNOSIS — R5383 Other fatigue: Secondary | ICD-10-CM

## 2014-11-18 DIAGNOSIS — R4189 Other symptoms and signs involving cognitive functions and awareness: Secondary | ICD-10-CM

## 2014-11-18 DIAGNOSIS — Z9221 Personal history of antineoplastic chemotherapy: Secondary | ICD-10-CM

## 2014-11-18 DIAGNOSIS — C50511 Malignant neoplasm of lower-outer quadrant of right female breast: Secondary | ICD-10-CM

## 2014-11-18 DIAGNOSIS — N8189 Other female genital prolapse: Secondary | ICD-10-CM

## 2014-11-18 NOTE — Progress Notes (Signed)
CLINIC:  Cancer Survivorship   REASON FOR VISIT:  Routine follow-up post-treatment for a recent history of breast cancer.  BRIEF ONCOLOGIC HISTORY:    Breast cancer of lower-outer quadrant of right female breast   09/03/2013 Initial Biopsy Right breast needle core biopsy at 8 o'clock position: IDC with DCIS, grade 3.  ER+ (weak 7%), PR+ (weak 6%), HER2 positive (ratio 2.22, copy #3.55). Ki67 is 70%.    09/14/2013 Breast MRI lower outer right breast 2.2 x 1.3 x 1.5 cm Irregular mass. In the upper outer left breast there was a 7 x 5 x 8 mm indeterminate enhancing mass. Also another 9 x 7 x 6 mm indeterminant enhancing mass at 9:00 in the left breast was noted   09/15/2013 Clinical Stage T2Nx; Stage IIA, right breast invasive ductal carcinoma, grade 3, ER positive, HER-2/neu positive    09/16/2013 Procedure Left breast needle core biopsy at 2 o'clock position: Benign.  Right axillary LN biopsy: Benign    09/29/2013 Echocardiogram Pre-treatment EF: 65-70%   10/02/2013 - 01/18/2014 Neo-Adjuvant Chemotherapy Docetaxel, Carboplatin, Trastuzumab, Pertuzumab X 4 cycles; Taxotere stopped for fluid retention replaced with Gemcitabine 800 mg/m2 and completed 2 more cycles of chemo;  (TCH-P x 4 cycles), (GCH-P x 2 cycles); Zolodex Q 4 weeks   11/16/2013 Procedure Genetic testing negative.  Genes tested include BRCA1, BRCA2.    03/01/2014 Definitive Surgery Right lumpectomy with SLNB Tina [name]) revealed high grade invasive carcinosarcoma [facility] w/malignant heterologous differentiation) spanning 0.6cm. Negative margins. 3 right axillary SLN (-). HER2 repeated & negative (ratio 0.71, copy #1.75).   03/01/2014 Pathologic Stage ypT1b, pN0, pMX ; Stage IA   04/27/2014 - 06/07/2014 Radiation Therapy Adjuvant radiation therapy completed Tina [name]). Right breast: total dose 50 Gy over 25 fractions.  Right breast boost: total dose 10 Gy over 5 fractions.    07/06/2014 Echocardiogram Most recent EF: 60-65%. Being followed in [facility].    07/09/2014 Surgery Bilat salpingo-oopherectomy and lysis of adhesions; surgical path benign.    07/28/2014 - 11/16/2014 Anti-estrogen oral therapy Anastrozole 1 mg daily started.  Planned duration of treatment: 5 years.  Anastrazole stopped 10/2014 d/t pain in hands and feet. Switched to Tamoxifen.     - 10/20/2014 Chemotherapy 1 year maintenance Herceptin completed    11/16/2014 -  Anti-estrogen oral therapy Tamoxifen 24m daily started.    11/18/2014 Survivorship Survivorship Care Plan given and reviewed with pt in-person.     INTERVAL HISTORY:  [name] to the Survivorship Clinic today for our initial meeting to review her survivorship care plan detailing her treatment course for breast cancer, as well as monitoring long-term side effects of that treatment, education regarding health maintenance, screening, and overall wellness and health promotion.     Overall, Ms. MEkstromreports feeling relatively well since completing her 1-year of maintenance Herceptin treatment.  She endorses "really bad joint and bone pain".  Her pain today is a 4/10. This pain dates back prior to her cancer diagnosis when she was diagnosed with several spinal and neck degenerative disc/bulging disc conditions.  The pain has been worse on the anastrazole since starting it in 06/2014.  The pain is improved by physical activity and stretching.  She enjoys working out at [employer] reports that she has lost a planned 30 lbs by eating better and exercising.  Within the past few days, she reported her pain to Dr. GGeralyn Flashnurse and Dr. GLindi Adiehas switched her from anastrazole to tamoxifen, as the AI could be  contributing to her worsening symptoms.  Tina [name] has not started the tamoxifen yet; she plans to start it tomorrow, 11/19/14.  Additionally, she reports experiencing some fatigue, which she attributes to the Effexor making her feel groggy, but she is still able to perform all ADLs.  She states that the  fatigue is improved when she is physically active.  Of note, she recently had a sleep study for presumed sleep apnea, but she is awaiting the official results.  She endorses hot flashes, but these are "so much better" on the Effexor XR.  One of her biggest concerns today is regarding vaginal dryness and her sexual intimacy with her spouse.  She reports "burning" with intercourse, as well as difficulty achieving orgasm.  She expresses concern about how this has affected her sexual health and intimacy with her husband. Of note, she had bilateral salpingo-oopherectomy in 06/2014, and is now in menopause as a result. Her uterus and cervix remain intact.    REVIEW OF SYSTEMS:  General: Denies fever, chills, unintentional weight loss. Cardiac: Denies palpitations, chest pain, and lower extremity edema.  Respiratory: Denies cough, shortness of breath, and dyspnea on exertion.  GI: Denies abdominal pain, constipation, diarrhea, nausea, or vomiting.  GU: Denies dysuria, hematuria, vaginal bleeding, vaginal discharge.  Endorses vaginal dryness and dyspareunia.  Musculoskeletal: Endorses "joint and bone pain" (chronic issue) Neuro: Denies headache or recent falls.  Skin: Denies rash, pruritis, or open wounds.  Breast: Denies any new nodularity, masses, tenderness, nipple changes, or nipple discharge. Describes a "pulling" sensation in her left lateral breast that she has noticed for about 1 month and is near her biopsy scar.  Psych: Endorses intermittent anxiety and worry regarding her cancer, as well as relationships with loved ones. Endorses some sleep disturbance. Also endorses issues with concentration, short-term memory, and word-finding difficulty which causes the patient distress and embarrassment in social situations.  A 14-point review of systems was completed and was negative, except as noted above.  BRIEF ONCOLOGIC HISTORY:    Breast cancer of lower-outer quadrant of right female breast    09/03/2013 Initial Biopsy Right breast needle core biopsy at 8 o'clock position: IDC with DCIS, grade 3.  ER+ (weak 7%), PR+ (weak 6%), HER2 positive (ratio 2.22, copy #3.55). Ki67 is 70%.    09/14/2013 Breast MRI lower outer right breast 2.2 x 1.3 x 1.5 cm Irregular mass. In the upper outer left breast there was a 7 x 5 x 8 mm indeterminate enhancing mass. Also another 9 x 7 x 6 mm indeterminant enhancing mass at 9:00 in the left breast was noted   09/15/2013 Clinical Stage T2Nx; Stage IIA, right breast invasive ductal carcinoma, grade 3, ER positive, HER-2/neu positive    09/16/2013 Procedure Left breast needle core biopsy at 2 o'clock position: Benign.  Right axillary LN biopsy: Benign    09/29/2013 Echocardiogram Pre-treatment EF: 65-70%   10/02/2013 - 01/18/2014 Neo-Adjuvant Chemotherapy Docetaxel, Carboplatin, Trastuzumab, Pertuzumab X 4 cycles; Taxotere stopped for fluid retention replaced with Gemcitabine 800 mg/m2 and completed 2 more cycles of chemo;  (TCH-P x 4 cycles), (GCH-P x 2 cycles); Zolodex Q 4 weeks   11/16/2013 Procedure Genetic testing negative.  Genes tested include BRCA1, BRCA2.    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 Negative margins. 3 right axillary SLN (-). HER2 repeated & negative (ratio 0.71, copy #1.75).   03/01/2014 Pathologic Stage ypT1b, pN0, pMX ; Stage IA  04/27/2014 - 06/07/2014 Radiation Therapy Adjuvant radiation therapy completed Tina [name]). Right breast: total dose 50 Gy over 25 fractions.  Right breast boost: total dose 10 Gy over 5 fractions.    07/06/2014 Echocardiogram Most recent EF: 60-65%. Being followed in [facility].    07/09/2014 Surgery Bilat salpingo-oopherectomy and lysis of adhesions; surgical path benign. Uterus and cervix remain intact.    07/28/2014 - 11/16/2014 Anti-estrogen oral therapy Anastrozole 1 mg daily started.   Planned duration of treatment: 5 years.  Anastrazole stopped 10/2014 d/t pain in hands and feet. Switched to Tamoxifen.     - 10/20/2014 Chemotherapy 1 year maintenance Herceptin completed    11/16/2014 -  Anti-estrogen oral therapy Tamoxifen 26m daily started.    11/18/2014 Survivorship Survivorship Care Plan given and reviewed with pt in-person.       ONCOLOGY TREATMENT TEAM:  1. Surgeon[facility]  [facility]Donne [facility] 2. Medical Oncologist: Dr. GLindi [name] 3. Radiation Oncologist: Dr. SIsidore [name]   PAST MEDICAL/SURGICAL HISTORY:  Past Medical History  Diagnosis Date  . Leukocytopenia   . Hypertension   . Leukopenia 06/16/2013  . Needs flu shot 06/17/2013    resolved - flu shot recvd in 04/2014  . Cancer of right breast 09/03/13    Invasive Ductal Carcinoma/Ductal Carcinoma Insitu  biopsies  . Anxiety   . GERD (gastroesophageal reflux disease)   . Degenerative joint disease     back neck and shoulder  . 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 S/P radiation therapy 04/27/2014-06/07/2014    1) Right breast, 50 Gy in 25 fractions/ 2) Right breast boost, 10 Gy in 5 fractions   Past Surgical History  Procedure Laterality Date  . Cesarean section      x 3  . Carpal tunnel release  1998    rt  . Trigger finger release  2004    rt thumb  . Portacath placement  08/2013    still currently active   . Tubal ligation    . Trigger finger release left thumb  06/28/14    at orthopedic surgical center  . Wisdom tooth extraction    . Breast surgery  03/01/2014    hx right breast cancer- 3 lymph nodes removed and lumpectomy  . Robotic assisted bilateral salpingo oopherectomy Bilateral 07/09/2014    Procedure: ROBOTIC ASSISTED BILATERAL SALPINGO OOPHORECTOM; :LYSIS OF ADHESIONS;  Surgeon: SMarvene Staff MD;  [facility];  Service: Gynecology;  Laterality: Bilateral;     ALLERGIES:  Allergies  Allergen Reactions  . Adhesive [Tape] Itching     Burned and scarred skinned  . Nickel Rash     CURRENT MEDICATIONS:  Outpatient Encounter Prescriptions as of 11/18/2014  Medication Sig  . VITAMIN E EX Apply topically. Vaginal suppository  . amLODipine (NORVASC) 5 MG tablet TAKE ONE TABLET BY MOUTH ONCE DAILY  . anastrozole (ARIMIDEX) 1 MG tablet Take 1 tablet (1 mg total) by mouth daily.  .Marland Kitchenaspirin 81 MG tablet Take 81 mg by mouth daily.  .Marland Kitchenb complex vitamins capsule Take 1 capsule by mouth daily.  .Marland KitchenBIOTIN 5000 PO Take 1 capsule by mouth daily.   . Calcium Carbonate-Vit D-Min (CALTRATE 600+D PLUS MINERALS PO) Take 1 tablet by mouth daily.  . Cholecalciferol (VITAMIN D3) 10000 UNITS capsule Take 10,000 Units by mouth daily.  . diphenhydrAMINE (SOMINEX) 25 MG tablet Take 25 mg by mouth at bedtime as needed for sleep.  .Marland Kitchengabapentin (NEURONTIN) 100  MG capsule TAKE THREE CAPSULES BY MOUTH THREE TIMES DAILY  . ibuprofen (ADVIL,MOTRIN) 800 MG tablet Take 1 tablet (800 mg total) by mouth every 8 (eight) hours as needed for mild pain.  Marland Kitchen lidocaine-prilocaine (EMLA) cream Apply topically as needed. (Patient taking differently: Apply topically as needed. Uses when she gets treatment)  . lisinopril-hydrochlorothiazide (PRINZIDE,ZESTORETIC) 20-12.5 MG per tablet TAKE TWO TABLETS BY MOUTH ONCE DAILY  . loratadine (CLARITIN) 10 MG tablet Take 10 mg by mouth daily.  Marland Kitchen LORazepam (ATIVAN) 0.5 MG tablet Take 1 tablet (0.5 mg total) by mouth every 6 (six) hours as needed (Nausea or vomiting). (Patient taking differently: Take 0.5 mg by mouth every 6 (six) hours as needed for anxiety. )  . omeprazole (PRILOSEC) 40 MG capsule Take 1 capsule (40 mg total) by mouth daily.  Marland Kitchen oxycodone (OXY-IR) 5 MG capsule Take 5 mg by mouth every 4 (four) hours as needed.  . tamoxifen (NOLVADEX) 20 MG tablet Take 1 tablet (20 mg total) by mouth daily.  Marland Kitchen venlafaxine XR (EFFEXOR-XR) 75 MG 24 hr capsule Take 1 capsule (75 mg total) by mouth daily with breakfast.  . VITAMIN E PO  Take 1 capsule by mouth daily.   . [DISCONTINUED] Trastuzumab (HERCEPTIN IV) Inject into the vein.      ONCOLOGIC FAMILY HISTORY:  1. Mother: Cervical cancer 2. Father: Prostate cancer  -Genetic Counseling and Testing completed on 11/16/13 revealed no genetic mutations in the following genes: BRCA1 and BRCA2.     SOCIAL HISTORY:  Amylynn [name] is married and lives with her husband in [place], [place].  They have 3 sons and 5 grandsons.   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  She has a wonderful support network in her friends and family.   PHYSICAL EXAMINATION:  Vital Signs:   Filed Vitals:   11/18/14 1545  BP: 142/91  Pulse: 72  Temp: 98.2 F (36.8 C)  Resp: 16   General: Well-nourished, well-appearing female in no acute distress.  She is unaccompanied today.   HEENT: Head is atraumatic and normocephalic.  Pupils equal and reactive to light and accomodation. Conjunctivae clear without exudate.  Sclerae anicteric. Oral mucosa is pink, moist, and intact without lesions.  Oropharynx is pink without lesions or erythema.  Lymph: No cervical, supraclavicular, infraclavicular, or axillary lymphadenopathy noted on palpation.  Cardiovascular: Regular rate and rhythm without murmurs, rubs, or gallops. Respiratory: Clear to auscultation bilaterally. Chest expansion symmetric without accessory muscle use on inspiration or expiration.  GI: Abdomen soft and round. No tenderness to palpation. Bowel sounds normoactive in 4 quadrants. No hepatosplenomegaly.   GU: Deferred.  Musculoskeletal: Muscle strength 5/5 in all extremities.  Full ROM noted in all extremities.  Neuro: No focal deficits. Steady gait.  Psych: Mood and affect normal and appropriate for situation.  Extremities: No edema, cyanosis, or clubbing.  Skin: Warm and dry. No open lesions noted.   LABORATORY DATA:  None for this visit.  DIAGNOSTIC IMAGING:  None  for this visit.     ASSESSMENT AND PLAN:   1. History of breast cancer:  Tina [name] will follow-up with her medical oncologist, Tina [name] in 12/2014 with history and physical exam per surveillance protocol.  She will continue her anti-estrogen therapy with Tamoxifen as prescribed by Tina [name]. She was instructed to make Tina [name] or myself aware if she begins to experience any side effects of the medication and I could see her back  in clinic to help manage those side effects, as needed. Though the incidence is low, there is an associated risk of endometrial cancer with anti-estrogen therapies like Tamoxifen. Although Tina [name] had bilateral salpingo-oopherectomy, her uterus and cervix remain intact. Tina [name] was encouraged to contact Tina [name] or myself with any vaginal bleeding while taking Tamoxifen. Other side effects of Tamoxifen were again reviewed with her as well. A comprehensive survivorship care plan and treatment summary was reviewed with the patient today detailing her breast cancer diagnosis, treatment course, potential late/long-term effects of treatment, appropriate follow-up care with recommendations for the future, and patient education resources.  A copy of this summary, along with a letter will be sent to the patient's primary care provider via mail/fax/In Basket message after today's visit.  Tina [name] is welcome to return to the Survivorship Clinic in the future, as needed; no follow-up will be scheduled at this time.    2. Joint pain: Tina [name]'s arthralgias and "bone pain" are a chronic issue for her, largely stemming from her degenerative disc disease and bulging discs.  This joint and her self-described "bone pain" is likely unrelated to cancer and more likely related to her chronic conditions, potentially exacerbated by the aromatase inhibitor. I am hopeful that the switch from anastrazole to Tamoxifen will help her arthralgias a bit as it is less likely to cause  arthralgias.  She has adequate pain control with her current pain regimen, prescribed by her physician in the community. Being physically active helps relieve much of her pain by working out/yoga/stretching. I encouraged her to continue her physical activity/stretching as tolerated and to contact us with any new or concerning symptom or pain, that persists and cannot be attributed to any other cause.  She verbalized understanding.   3. Fatigue: The complaint of fatigue for cancer survivors is largely multifactorial.  For Tina [name], the etiology is likely due to sleep disturbance and her medications.  She states that the Effexor XR makes her drowsy, but it has helped her hot flashes so tremendously that the complaint of fatigue is "something I'm willing to live with to not be miserable with hot flashes."  Tina [name] also recently completed a sleep study, where she describes symptoms of sleep apnea, which could also be contributing to her fatigue.  Another provider is helping her manage her sleep disturbance and she is awaiting the final results of her recent sleep study.  We discussed the importance of physical activity in order to help combat fatigue.  She has completed the [employer] for cancer survivors at the [facility] and continues to be physically active.  I commended her on her accomplishments and continued to encourage her to be physically active as tolerated.   4. Cognitive changes:  Short-term memory loss, as well as word finding difficulties are extremely common in as many as 25% of cancer survivors.  For Ms. Shuster, her history of chemotherapy administration could be contributing to her cognitive changes.  This symptom is distressing for Tina [name], as she enjoys socializing with friends and family, but becomes embarrassed when she cannot communicate effectively.  We discussed the nature of "chemo brain", as well as the fatigue and sleep disturbances potentially contributing to her  cognitive changes as well.  Also, dealing with the trauma of a cancer diagnosis and treatment can make it difficult to concentrate on tasks and can affect memory as well.  Tina [name] would like further evaluation of this complaint.  I have placed a Neuro  Rehab referral to Dr. Antionette Poles, neuropsychiatrist for evaluation and treatment, as appropriate.   5. Vaginal dryness/Dyspareunia/Sexual intimacy: One of the most common complaints for women undergoing or upon completion of treatment for breast cancer is vaginal dryness and sexual dysfunction.  Given her now menopausal status as a result of bilateral salpingo-oopherectomy (BSO), as well as anti-estrogen therapy, vaginal dryness is a very common problem in the setting of low estrogen levels.  We discussed at length the role that estrogen, progesterone, and testosterone play in menopause and overall health.  Likely the reason her symptoms are so pronounced with sudden hormone fluctuations are as a result of the chemotherapy, BSO, and normal physiologic changes for a 54 year old woman.  I encouraged her to continue to use her Vitamin E vaginal suppositories as a vaginal moisturizer.  We discussed the importance of using vaginal lubricant with intercourse.  Now that she is taking Tamoxifen, she would even be a candidate for some low-dose vaginal estrogen/E-string.  I encouraged her to talk to Tina [name] about this at her appointment with him in June.  It will be important to see how well she tolerates the Tamoxifen in the coming months before adding any additional medications for vaginal dryness at this time.  The burning that she is experiencing during intercourse is likely the result of small vaginal tears related to vaginal dryness.  I have placed a referral today to our Pelvic Floor Rehab Physical Therapist to evaluate and treat as needed.  With regards to sexual intimacy and difficulty achieving orgasm, I encouraged her to consider seeing a local sexual  therapist in the community.  I will provide a list of names of therapists for her to consider.  The changes to a woman's body after the treatment of breast cancer affect not only the survivor, but her spouse as well.  Having a professional help explore those emotions and issues regarding intimacy may be helpful.    6. Bone health:  Given Ms. Piacente's history of breast cancer and her current anti-estrogen treatment regimen, she is at risk for bone demineralization.  Her last DEXA scan was 11/16/14 and showed some osteopenia.  We discussed that Tamoxifen can actually strengthen bones, rather than the aromatase inhibitors that are known to thin bones.  She will be re-evaluated with another DEXA scan at the discretion of Tina [name] or her PCP.  In the meantime, she was encouraged to increase her consumption of foods rich in calcium, as well as increase her weight-bearing activities.  She was given education on specific activities to promote bone health.  7. Cancer screening:  Due to Tina [name]'s history and her age, she should receive screening for skin cancers, colon cancer, and gynecologic cancers.  The information and recommendations are listed on the patient's comprehensive care plan/treatment summary and were reviewed in detail with the patient.    8. Health maintenance and wellness promotion: Ms. Pucillo was encouraged to consume 5-7 servings of fruits and vegetables per day. She was also encouraged to engage in moderate to vigorous exercise for 30 minutes per day most days of the week. She was instructed to limit her alcohol consumption and continue to abstain from tobacco use.     9. Support services/counseling: Tina [name] recently completed the "Finding Your New Normal" [facility]) course series which offers cancer survivors an opportunity to explore their physical, mental, and emotional difficulties following a cancer diagnosis, its treatment, and sequelae.  She expressed that she received great  benefit from the course  and is grateful for completing it.  It is not uncommon for this period of the patient's cancer care trajectory to be one of many emotions and stressors.  Tina [name] was encouraged to take advantage of our many support services programs, support groups, and/or counseling in coping with her new life as a cancer survivor after completing anti-cancer treatment.  She was offered support today through active listening and expressive supportive counseling.  She was given information regarding our available services and encouraged to contact me with any questions or for help enrolling in any of our support group/programs.   Note:  The "pulling" feeling she has been feeling periodically in her left (unaffected) breast is likely due to small adhesions from her biopsy site.  The sensation is noted on the lateral aspect of her left breast near her biopsy scar, by her report. I encouraged her to continue to monitor the area and if there were any changes in severity or frequency, then to let us know.   She should continue to stretch and perform physical activity as tolerated to help with this symptom.    A total of 65 minutes of face-to-face time was spent with this patient with greater than 50% of that time in counseling and care-coordination.   Mike Craze, NP Survivorship Program [facility] [phone]   Note: PRIMARY CARE PROVIDER [name], [place] [phone]

## 2014-11-22 ENCOUNTER — Telehealth: Payer: Self-pay | Admitting: *Deleted

## 2014-11-22 NOTE — Telephone Encounter (Signed)
I called the Tina [name] and left a detailed message per Dr Maudie Mercury the sleep study did not show sleep apnea and to call the office if she has any questions.

## 2014-11-30 ENCOUNTER — Encounter: Payer: Self-pay | Admitting: *Deleted

## 2014-11-30 NOTE — Progress Notes (Signed)
Received bone density report from [facility], sent to scan.

## 2014-12-01 ENCOUNTER — Ambulatory Visit: Payer: BLUE CROSS/BLUE SHIELD | Attending: Adult Health | Admitting: Physical Therapy

## 2014-12-01 ENCOUNTER — Encounter: Payer: Self-pay | Admitting: Physical Therapy

## 2014-12-01 DIAGNOSIS — M791 Myalgia, unspecified site: Secondary | ICD-10-CM

## 2014-12-01 DIAGNOSIS — M25551 Pain in right hip: Secondary | ICD-10-CM | POA: Insufficient documentation

## 2014-12-01 NOTE — Therapy (Signed)
[facility] [address], [place], [place], [address] Phone: [phone]   Fax:  [phone]  Physical Therapy Evaluation  Patient Details  Name: Tina [name] MRN: [id number] Date of Birth: [birth year] Referring Provider:  Holley Bouche, NP  Encounter Date: 12/01/2014      PT End of Session - 12/01/14 1532    Visit Number 1   Date for PT Re-Evaluation 02/23/15   PT Start Time 3532   PT Stop Time 1530   PT Time Calculation (min) 45 min   Activity Tolerance Patient tolerated treatment well   Behavior During Therapy [facility] for tasks assessed/performed      Past Medical History  Diagnosis Date  . Leukocytopenia   . Hypertension   . Leukopenia 06/16/2013  . Needs flu shot 06/17/2013    resolved - flu shot recvd in 04/2014  . Cancer of right breast 09/03/13    Invasive Ductal Carcinoma/Ductal Carcinoma Insitu  biopsies  . Anxiety   . GERD (gastroesophageal reflux disease)   . Degenerative joint disease     back neck and shoulder  . 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 S/P radiation therapy 04/27/2014-06/07/2014    1) Right breast, 50 Gy in 25 fractions/ 2) Right breast boost, 10 Gy in 5 fractions    Past Surgical History  Procedure Laterality Date  . Cesarean section      x 3  . Carpal tunnel release  1998    rt  . Trigger finger release  2004    rt thumb  . Portacath placement  08/2013    still currently active   . Tubal ligation    . Trigger finger release left thumb  06/28/14    at [facility] surgical center  . Wisdom tooth extraction    . Breast surgery  03/01/2014    hx right breast cancer- 3 lymph nodes removed and lumpectomy  . Robotic assisted bilateral salpingo oopherectomy Bilateral 07/09/2014    Procedure: ROBOTIC ASSISTED BILATERAL SALPINGO OOPHORECTOM; :LYSIS OF ADHESIONS;  Surgeon: Marvene Staff, MD;  Location: [facility];  Service: Gynecology;  Laterality:  Bilateral;    There were no vitals filed for this visit.  Visit Diagnosis:  Muscle pain - Plan: PT plan of care cert/re-cert  Right hip pain - Plan: PT plan of care cert/re-cert      Subjective Assessment - 12/01/14 1454    Subjective Patient is s/p breast cancer that was estrogen positive. She is presently on Tamoxifin.  Patient is experiencing vaginal dryness, pain with intercourse, low sex drive, vaginal burning with initial penetration of the penis. Sometimes has lower abdominal discomfort.    Pertinent History Breast cancer with radiation and chemotherapy. Bulging disc in lumbar.   How long can you sit comfortably? None   How long can you stand comfortably? 15 min with right hip discomfort   How long can you walk comfortably? 15 min due to her whole body bothering her   Patient Stated Goals reduce dryness and vaginal discomfort   Currently in Pain? Yes   Pain Score 5    Pain Location Vagina   Pain Orientation Mid   Pain Descriptors / Indicators Burning   Pain Type Chronic pain   Pain Onset More than a month ago  07/09/2014 when ovaries were removed   Pain Frequency Intermittent   Aggravating Factors  intercourse, walking, vaginal exam   Pain Relieving Factors exercise   Multiple  Pain Sites Yes   Pain Score 6   Pain Location Hip  inside hip joint   Pain Orientation Right   Pain Descriptors / Indicators Dull;Aching   Pain Type Chronic pain   Pain Onset More than a month ago  07/10/2015 worse but started 6 years ago   Pain Frequency Intermittent   Aggravating Factors  standing, moving right leg outward   Pain Relieving Factors rest            [facility] PT Assessment - 12/01/14 0001    Assessment   Medical Diagnosis N81.89 Pelvic floor weakness   Onset Date 07/09/14   Prior Therapy yes for lypmhedema   Precautions   Precaution Comments no ultrasound    Balance Screen   Has the patient fallen in the past 6 months No   Has the patient had a decrease in activity  level because of a fear of falling?  No   Is the patient reluctant to leave their home because of a fear of falling?  No   Prior Function   Level of Independence Independent with basic ADLs   Vocation On disability   Observation/Other Assessments   Focus on Therapeutic Outcomes (FOTO)  75% limitation CL   AROM   Lumbar Extension decreased by 25%   Lumbar - Right Side Bend decreased by 25%   Lumbar - Left Side Bend decreased by 25%   PROM   Right Hip External Rotation  38   Strength   Right Hip External Rotation  --  5/5   Right Hip ABduction 3+/5   Right Hip   Right Hip Flexion 108   Right Hip ABduction 15   Palpation   Palpation Right ilium anteriorly rotated; right piriformis, gluteius medius, bil. levator ani                 Pelvic Floor Special Questions - 12/01/14 0001    Marinoff Scale discomfort that does not affect completion   Urinary Leakage No   Fecal incontinence No   Falling out feeling (prolapse) No   Skin Integrity Intact   Perineal Body/Introitus  Normal   Pelvic Floor Internal Exam Patient confirms identification and approves PT to perform assessment of muscles and strength   Exam Type Vaginal   Palpation Palpable tenderness located in right obturator internist, bil. ischiococcygeus, right piriformis, and perineal body  and right ischiocavernosus   Strength good squeeze, good lift, able to hold agaisnt strong resistance   Strength # of reps 5   Strength # of seconds 8   Tone increased tone                  PT Education - 12/01/14 1728    Education provided No          PT Short Term Goals - 12/01/14 1733    PT SHORT TERM GOAL #1   Title understand how to perform self soft tissue work to perineum   Time 4   Period Weeks   Status New   PT SHORT TERM GOAL #2   Title understand how to use moisturizers and lubricants to the vaginal area to decreased dryness and burning   Time 4   Period Weeks   Status New   PT SHORT TERM GOAL  #3   Title right hip pain decreased >/= 25% due to increased mobility   Time 4   Period Weeks   Status New   PT SHORT TERM GOAL #4  Title pain with intercourse decreased >/= 25%   Time 4   Period Weeks   Status New           PT Long Term Goals - 12/01/14 1734    PT LONG TERM GOAL #1   Title pain with intercourse decreased >/= 75% due to increased right hip mobility and decreased pelvic floor muscle spasms   Time 12   Period Weeks   Status New   PT LONG TERM GOAL #2   Title pain with walking decresed >/= 75% due to increased right hip mobility and decreased pelvic floor muscle spasms   Time 12   Status New   PT LONG TERM GOAL #3   Title vaginal dryness decreased >/= 50% due to using moisturizers and lubricants   Time 12   Period Weeks   Status New           Long Term Clinic Goals - 06/08/14 1757    CC Long Term Goal  #1   Title Verbalize good understanding of the maintenance phase of treatment including manual lymph drainage, use of compression and lymphedema risk reduction practicies   Time 3   Period Weeks   Status New   CC Long Term Goal  #2   Title reduces edema by 75% at right lateral breast per patient reports   Time 3   Period Weeks   Status On-going   CC Long Term Goal  #3   Title be independent with a home exercise program   Time 3   Period Weeks   Status On-going   CC Long Term Goal  #4   Title report overall pain decreased by >/= 75 % to tolerate daily tasks with less pain   Time 3   Period Weeks   Status Achieved   CC Long Term Goal  #5   Title report a 50% decrease in fatigue   Time 3   Period Weeks   Status On-going            Plan - 12/01/14 1728    Clinical Impression Statement Patient is a 54 year old female with pelvic pain during intercourse, vaginal dryness causing friction with intercourse, pain in pelvic floor muscles especially on the right , weak pelvic floor strength 4/5.  Patient right hip PROM is ER 38 degrees, flexion  108 degrees, and abduction 15 degress with pain .  Patient has difficulty walking and having intercourse due to right hip pain. Patient FOTO score shows she is 75% limited due to right hip and pelvic pain.    Pt will benefit from skilled therapeutic intervention in order to improve on the following deficits Decreased range of motion;Difficulty walking;Impaired flexibility;Decreased endurance;Decreased activity tolerance;Increased fascial restricitons;Pain;Increased muscle spasms;Decreased mobility;Decreased strength   Rehab Potential Good   PT Frequency 2x / week   PT Duration 12 weeks   PT Treatment/Interventions Moist Heat;Therapeutic activities;Patient/family education;Passive range of motion;Therapeutic exercise;Biofeedback;Manual techniques;Neuromuscular re-education;Cryotherapy;Electrical Stimulation;Functional mobility training   PT Next Visit Plan Soft tissue work to pelvic floor muscles and right hip, right hip mobilization, instruction on lubrication and moisturizers, instruction on self perineal massage.    PT Home Exercise Plan self soft tissue work to the perineum   Recommended Other Services None   Consulted and Agree with Plan of Care Patient   PT Plan see above         Problem List Patient Active Problem List   Diagnosis Date Noted  . Hot flashes 10/26/2014  .  GAD (generalized anxiety disorder) 10/26/2014  . Peripheral neuropathy due to chemotherapy 03/12/2014  . Breast cancer of lower-outer quadrant of right female breast 09/15/2013  . Left shoulder pain - seeing Dr. Lorin Mercy 08/20/2013  . Hypertension     Wane Mollett,PT 12/01/2014, 5:39 PM  [facility] [address], [place], [place], [address] Phone: [phone]   Fax:  [phone]

## 2014-12-14 ENCOUNTER — Encounter: Payer: Self-pay | Admitting: Physical Therapy

## 2014-12-14 ENCOUNTER — Ambulatory Visit: Payer: BLUE CROSS/BLUE SHIELD | Admitting: Physical Therapy

## 2014-12-14 DIAGNOSIS — M791 Myalgia, unspecified site: Secondary | ICD-10-CM

## 2014-12-14 DIAGNOSIS — M25551 Pain in right hip: Secondary | ICD-10-CM

## 2014-12-14 NOTE — Patient Instructions (Signed)
Knee-to-Chest Stretch: Unilateral   With hand behind right knee, pull knee in to chest until a comfortable stretch is felt in lower back and buttocks. Keep back relaxed. Hold _30___ seconds. Repeat __2__ times per set. Do _1___ sets per session. Do _1___ sessions per day.  http://orth.exer.us/126   Copyright  VHI. All rights reserved.  Warrior I Pose   In wide stride stance, feet facing forward, bend right knee and extend left leg behind, heel elevated. Distribute weight equally between front foot and ball of back foot. Extend arms upward beside ears.rotated to left.  Hold for _30__ breaths. Repeat with other leg forward. Repeat _1__ times, alternating legs. Do __1_ times per day.  Copyright  VHI. All rights reserved.

## 2014-12-14 NOTE — Therapy (Signed)
[facility] [address] [place], [place], [address] Phone: [phone]   Fax:  [phone]  Physical Therapy Treatment  Patient Details  Name: Tina [name] MRN: [id number] Date of Birth: 11/29/1960 Referring Provider:  Holley Bouche, NP  Encounter Date: 12/14/2014      PT End of Session - 12/14/14 1237    Visit Number 2   Date for PT Re-Evaluation 02/23/15   PT Start Time 1230   PT Stop Time 1310   PT Time Calculation (min) 40 min   Activity Tolerance Patient tolerated treatment well   Behavior During Therapy [facility] for tasks assessed/performed      Past Medical History  Diagnosis Date  . Leukocytopenia   . Hypertension   . Leukopenia 06/16/2013  . Needs flu shot 06/17/2013    resolved - flu shot recvd in 04/2014  . Cancer of right breast 09/03/13    Invasive Ductal Carcinoma/Ductal Carcinoma Insitu  biopsies  . Anxiety   . GERD (gastroesophageal reflux disease)   . Degenerative joint disease     back neck and shoulder  . 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 S/P radiation therapy 04/27/2014-06/07/2014    1) Right breast, 50 Gy in 25 fractions/ 2) Right breast boost, 10 Gy in 5 fractions    Past Surgical History  Procedure Laterality Date  . Cesarean section      x 3  . Carpal tunnel release  1998    rt  . Trigger finger release  2004    rt thumb  . Portacath placement  08/2013    still currently active   . Tubal ligation    . Trigger finger release left thumb  06/28/14    at [facility]opedic surgical center  . Wisdom tooth extraction    . Breast surgery  03/01/2014    hx right breast cancer- 3 lymph nodes removed and lumpectomy  . Robotic assisted bilateral salpingo oopherectomy Bilateral 07/09/2014    Procedure: ROBOTIC ASSISTED BILATERAL SALPINGO OOPHORECTOM; :LYSIS OF ADHESIONS;  Surgeon: Marvene Staff, MD;  Location: [facility];  Service: Gynecology;  Laterality:  Bilateral;    There were no vitals filed for this visit.  Visit Diagnosis:  Muscle pain  Right hip pain      Subjective Assessment - 12/14/14 1234    Subjective my wrist is hurting and I am seeing my doctor about it.  Lubrication helps with intercourse.    Pertinent History Breast cancer with radiation and chemotherapy. Bulging disc in lumbar.   How long can you sit comfortably? None   How long can you stand comfortably? 15 min with right hip discomfort   How long can you walk comfortably? 15 min due to her whole body bothering her   Patient Stated Goals reduce dryness and vaginal discomfort   Currently in Pain? Yes   Pain Score 4    Pain Location Vagina   Pain Orientation Mid   Pain Descriptors / Indicators Burning   Pain Type Chronic pain   Pain Onset More than a month ago   Pain Frequency Intermittent   Aggravating Factors  intercourse, walking, vaginal exam   Pain Relieving Factors exercise   Multiple Pain Sites No   Pain Score 5   Pain Location Hip   Pain Orientation Right   Pain Descriptors / Indicators Dull;Aching   Pain Type Chronic pain   Pain Onset More than a month ago  Pain Frequency Intermittent   Aggravating Factors  standing and moving right leg outward   Pain Relieving Factors rest                      Pelvic Floor Special Questions - 12/14/14 0001    Pelvic Floor Internal Exam Patient confirms identification and approves PT to perform assessment of muscles and strength           [facility] Adult PT Treatment/Exercise - 12/14/14 0001    Manual Therapy   Manual Therapy Joint mobilization;Soft tissue mobilization;Internal Pelvic Floor   Joint Mobilization L1-L5 rotational mobilization grade 3, muscle energy technique to correct anteriorly rotated ilium, Right hip mobilization grade 3 with mulligan belt to stretch posteriorl capsule and distraction; anterior mobilization to right hip grade 3   Soft tissue mobilization right psoas   Internal  Pelvic Floor to right obturator internist, levator ani, and coccygeus with hip movement                PT Education - 12/14/14 1258    Education provided Yes   Education Details warrior pose, [place]   Person(s) Educated Patient   Methods Explanation;Demonstration;Tactile cues;Verbal cues;Handout   Comprehension Verbalized understanding;Returned demonstration          PT Short Term Goals - 12/14/14 1312    PT SHORT TERM GOAL #1   Title understand how to perform self soft tissue work to perineum   Time 4   Period Weeks   Status New  not educated yet   PT SHORT TERM GOAL #2   Title understand how to use moisturizers and lubricants to the vaginal area to decreased dryness and burning   Time 4   Period Weeks   Status Achieved   PT SHORT TERM GOAL #3   Title right hip pain decreased >/= 25% due to increased mobility   Time 4   Period Weeks   Status New  just started therapy   PT SHORT TERM GOAL #4   Title pain with intercourse decreased >/= 25%   Time 4   Period Weeks   Status Achieved           PT Long Term Goals - 12/01/14 1734    PT LONG TERM GOAL #1   Title pain with intercourse decreased >/= 75% due to increased right hip mobility and decreased pelvic floor muscle spasms   Time 12   Period Weeks   Status New   PT LONG TERM GOAL #2   Title pain with walking decresed >/= 75% due to increased right hip mobility and decreased pelvic floor muscle spasms   Time 12   Status New   PT LONG TERM GOAL #3   Title vaginal dryness decreased >/= 50% due to using moisturizers and lubricants   Time 12   Period Weeks   Status New               Plan - 12/14/14 1313    Clinical Impression Statement Patient reports intercourse is less painful due to her using the lubricants.  Patient has musculature tightness in right hip, right pelvic floor muscle contributing to pain with intercourse.  Patient has decreased mobility of right hip  which increases her pain.   Patient has met STG # 2 and #4.    Pt will benefit from skilled therapeutic intervention in order to improve on the following deficits Decreased range of motion;Difficulty walking;Impaired flexibility;Decreased endurance;Decreased activity tolerance;Increased fascial restricitons;Pain;Increased muscle  spasms;Decreased mobility;Decreased strength   PT Frequency 2x / week   PT Duration 12 weeks   PT Treatment/Interventions Moist Heat;Therapeutic activities;Patient/family education;Passive range of motion;Therapeutic exercise;Biofeedback;Manual techniques;Neuromuscular re-education;Cryotherapy;Electrical Stimulation;Functional mobility training   PT Next Visit Plan soft tissue work, joint mobilitzation to right hip, stretches   PT Home Exercise Plan self soft tissue work to the perineum   Consulted and Agree with Plan of Care Patient        Problem List Patient Active Problem List   Diagnosis Date Noted  . Hot flashes 10/26/2014  . GAD (generalized anxiety disorder) 10/26/2014  . Peripheral neuropathy due to chemotherapy 03/12/2014  . Breast cancer of lower-outer quadrant of right female breast 09/15/2013  . Left shoulder pain - seeing Dr. Lorin Mercy 08/20/2013  . Hypertension     Kevontae Burgoon,PT 12/14/2014, 1:16 PM  [facility] [address] [place], [place], [address] Phone: [phone]   Fax:  [phone]

## 2014-12-16 ENCOUNTER — Encounter: Payer: Self-pay | Admitting: Physical Therapy

## 2014-12-16 ENCOUNTER — Ambulatory Visit: Payer: BLUE CROSS/BLUE SHIELD | Admitting: Physical Therapy

## 2014-12-16 DIAGNOSIS — M25551 Pain in right hip: Secondary | ICD-10-CM

## 2014-12-16 DIAGNOSIS — M791 Myalgia, unspecified site: Secondary | ICD-10-CM

## 2014-12-16 NOTE — Patient Instructions (Addendum)
Butterfly, Supine  Place hands on inner thigh. Resist inward movent then press knees downward.  Lay on back  Keep left knee bent Cross right leg over left Resist knee going inward 5 times then hold stretch 30 seconds.   Lie on back, feet together. Lower knees toward floor. Hold _30__ seconds. Repeat ___ times per session. Do ___ sessions per day.  Copyright  VHI. All rights reserved.  Lunge Stretch   Step into deep forward lunge, hands on thigh, knee lightly touching floor. Push back leg straight. Turn right knee outward with pressure on outer surface of foot. Place hands on the floor inside leg.  Do not allow front knee past line of toes. Hold for _30___ breaths. Repeat on other side. ADVANCED: Arms reaching up, arch back slightly.  Copyright  VHI. All rights reserved.  Patient lay on back with right knee bent  And move right knee in and out as you move right foot up and down the left leg

## 2014-12-16 NOTE — Therapy (Signed)
[facility] [address], [place], [place], [address] Phone: [phone]   Fax:  [phone]  Physical Therapy Treatment  Patient Details  Name: Tina [name] MRN: [id number] Date of Birth: 01-Mar-1961 Referring Provider:  Holley Bouche, NP  Encounter Date: 12/16/2014      PT End of Session - 12/16/14 1443    Visit Number 3   Date for PT Re-Evaluation 02/23/15   PT Start Time 1400   PT Stop Time 1445   PT Time Calculation (min) 45 min   Activity Tolerance Patient tolerated treatment well   Behavior During Therapy [facility] for tasks assessed/performed      Past Medical History  Diagnosis Date  . Leukocytopenia   . Hypertension   . Leukopenia 06/16/2013  . Needs flu shot 06/17/2013    resolved - flu shot recvd in 04/2014  . Cancer of right breast 09/03/13    Invasive Ductal Carcinoma/Ductal Carcinoma Insitu  biopsies  . Anxiety   . GERD (gastroesophageal reflux disease)   . Degenerative joint disease     back neck and shoulder  . 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 S/P radiation therapy 04/27/2014-06/07/2014    1) Right breast, 50 Gy in 25 fractions/ 2) Right breast boost, 10 Gy in 5 fractions    Past Surgical History  Procedure Laterality Date  . Cesarean section      x 3  . Carpal tunnel release  1998    rt  . Trigger finger release  2004    rt thumb  . Portacath placement  08/2013    still currently active   . Tubal ligation    . Trigger finger release left thumb  06/28/14    at [facility]opedic surgical center  . Wisdom tooth extraction    . Breast surgery  03/01/2014    hx right breast cancer- 3 lymph nodes removed and lumpectomy  . Robotic assisted bilateral salpingo oopherectomy Bilateral 07/09/2014    Procedure: ROBOTIC ASSISTED BILATERAL SALPINGO OOPHORECTOM; :LYSIS OF ADHESIONS;  Surgeon: Marvene Staff, MD;  Location: [facility];  Service: Gynecology;  Laterality:  Bilateral;    There were no vitals filed for this visit.  Visit Diagnosis:  Muscle pain  Right hip pain      Subjective Assessment - 12/16/14 1405    Subjective My right hip feels freer and looser.  Patient reports no change in pain.  Patient reports anterior right hip pain with recumbent bike. I have pain when I cross my right hip in the groin.    Pertinent History Breast cancer with radiation and chemotherapy. Bulging disc in lumbar.   How long can you sit comfortably? None   How long can you stand comfortably? 15 min with right hip discomfort   How long can you walk comfortably? 15 min due to her whole body bothering her   Patient Stated Goals reduce dryness and vaginal discomfort   Currently in Pain? Yes   Pain Score 4    Pain Location Vagina  Hip    Pain Orientation Mid;Right   Pain Descriptors / Indicators --  catch   Pain Type Chronic pain   Pain Onset More than a month ago   Pain Frequency Intermittent   Aggravating Factors  crossing legs, walking, vaginal exam   Pain Relieving Factors exercise   Multiple Pain Sites No  [facility] Adult PT Treatment/Exercise - 12/16/14 0001    Lumbar Exercises: Aerobic   Elliptical level 1 5 min   Manual Therapy   Joint Mobilization gradew 3 mobilization to right hip with mulligan belt for distraction, lateral glide, inferior glide   Soft tissue mobilization to anterior right hip with hip extension in supine, hip adductors with hip extension and abduction; righ tobturator internist in [place]                PT Education - 12/16/14 1443    Education provided Yes   Education Details stretches for right hip using yoga moves   Person(s) Educated Patient   Methods Explanation;Demonstration;Tactile cues;Verbal cues;Handout   Comprehension Verbalized understanding;Returned demonstration          PT Short Term Goals - 12/14/14 1312    PT SHORT TERM GOAL #1   Title understand how to  perform self soft tissue work to perineum   Time 4   Period Weeks   Status New  not educated yet   PT SHORT TERM GOAL #2   Title understand how to use moisturizers and lubricants to the vaginal area to decreased dryness and burning   Time 4   Period Weeks   Status Achieved   PT SHORT TERM GOAL #3   Title right hip pain decreased >/= 25% due to increased mobility   Time 4   Period Weeks   Status New  just started therapy   PT SHORT TERM GOAL #4   Title pain with intercourse decreased >/= 25%   Time 4   Period Weeks   Status Achieved           PT Long Term Goals - 12/01/14 1734    PT LONG TERM GOAL #1   Title pain with intercourse decreased >/= 75% due to increased right hip mobility and decreased pelvic floor muscle spasms   Time 12   Period Weeks   Status New   PT LONG TERM GOAL #2   Title pain with walking decresed >/= 75% due to increased right hip mobility and decreased pelvic floor muscle spasms   Time 12   Status New   PT LONG TERM GOAL #3   Title vaginal dryness decreased >/= 50% due to using moisturizers and lubricants   Time 12   Period Weeks   Status New               Plan - 12/16/14 1445    Clinical Impression Statement Patient continues to have tightness in right hip capsule, tightness in right hip adductors, hip flexor, and obturator internist.  Patient has tolerated treatment well and feel hip has been freed after manual skills.  Paitent feels she is able to walk correctly due to the increse mobility.   Pt will benefit from skilled therapeutic intervention in order to improve on the following deficits Decreased range of motion;Difficulty walking;Impaired flexibility;Decreased endurance;Decreased activity tolerance;Increased fascial restricitons;Pain;Increased muscle spasms;Decreased mobility;Decreased strength   Rehab Potential Good   PT Frequency 2x / week   PT Duration 12 weeks   PT Treatment/Interventions Moist Heat;Therapeutic  activities;Patient/family education;Passive range of motion;Therapeutic exercise;Biofeedback;Manual techniques;Neuromuscular re-education;Cryotherapy;Electrical Stimulation;Functional mobility training   PT Next Visit Plan internal soft tissue    Consulted and Agree with Plan of Care Patient        Problem List Patient Active Problem List   Diagnosis Date Noted  . Hot flashes 10/26/2014  . GAD (generalized anxiety disorder) 10/26/2014  . Peripheral neuropathy  due to chemotherapy 03/12/2014  . Breast cancer of lower-outer quadrant of right female breast 09/15/2013  . Left shoulder pain - seeing Dr. Lorin Mercy 08/20/2013  . Hypertension     Evalyne Cortopassi,PT 12/16/2014, 2:48 PM  [facility] [address] [place], [place], [address] Phone: [phone]   Fax:  [phone]

## 2014-12-20 ENCOUNTER — Ambulatory Visit: Payer: BLUE CROSS/BLUE SHIELD | Admitting: Physical Therapy

## 2014-12-20 ENCOUNTER — Encounter: Payer: Self-pay | Admitting: Physical Therapy

## 2014-12-20 DIAGNOSIS — M791 Myalgia, unspecified site: Secondary | ICD-10-CM

## 2014-12-20 DIAGNOSIS — M25551 Pain in right hip: Secondary | ICD-10-CM

## 2014-12-20 NOTE — Therapy (Signed)
[facility] [address] [place], [place], [address] Phone: [phone]   Fax:  [phone]  Physical Therapy Treatment  Patient Details  Name: Tina [name] MRN: [id number] Date of Birth: 1961-07-03 Referring Provider:  Holley Bouche, NP  Encounter Date: 12/20/2014      PT End of Session - 12/20/14 1312    Visit Number 4   Date for PT Re-Evaluation 02/23/15   PT Start Time 1230   PT Stop Time 1312   PT Time Calculation (min) 42 min   Activity Tolerance Patient tolerated treatment well   Behavior During Therapy [facility] for tasks assessed/performed      Past Medical History  Diagnosis Date  . Leukocytopenia   . Hypertension   . Leukopenia 06/16/2013  . Needs flu shot 06/17/2013    resolved - flu shot recvd in 04/2014  . Cancer of right breast 09/03/13    Invasive Ductal Carcinoma/Ductal Carcinoma Insitu  biopsies  . Anxiety   . GERD (gastroesophageal reflux disease)   . Degenerative joint disease     back neck and shoulder  . 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 S/P radiation therapy 04/27/2014-06/07/2014    1) Right breast, 50 Gy in 25 fractions/ 2) Right breast boost, 10 Gy in 5 fractions    Past Surgical History  Procedure Laterality Date  . Cesarean section      x 3  . Carpal tunnel release  1998    rt  . Trigger finger release  2004    rt thumb  . Portacath placement  08/2013    still currently active   . Tubal ligation    . Trigger finger release left thumb  06/28/14    at orthopedic surgical center  . Wisdom tooth extraction    . Breast surgery  03/01/2014    hx right breast cancer- 3 lymph nodes removed and lumpectomy  . Robotic assisted bilateral salpingo oopherectomy Bilateral 07/09/2014    Procedure: ROBOTIC ASSISTED BILATERAL SALPINGO OOPHORECTOM; :LYSIS OF ADHESIONS;  Surgeon: Marvene Staff, MD;  Location: [facility];  Service: Gynecology;  Laterality:  Bilateral;    There were no vitals filed for this visit.  Visit Diagnosis:  Muscle pain  Right hip pain      Subjective Assessment - 12/20/14 1235    Subjective I felt wonderful after the last visit until Saturday. I went to church on Sunday and sat down when the pain returned.  I have increased pain sitting on a hard surface. Pain with intercourse decreased by 25%.    Pertinent History Breast cancer with radiation and chemotherapy. Bulging disc in lumbar.   How long can you sit comfortably? None   How long can you stand comfortably? 15 min with right hip discomfort   How long can you walk comfortably? 15 min due to her whole body bothering her   Patient Stated Goals reduce dryness and vaginal discomfort   Currently in Pain? Yes   Pain Score 5    Pain Location Hip  right buttock   Pain Orientation Right   Pain Descriptors / Indicators Dull;Aching   Pain Type Chronic pain   Pain Onset More than a month ago   Pain Frequency Intermittent   Aggravating Factors  inner thigh with opening and closing her right thigh, increased right buttocks pain with sitting on hard surface and squatting.    Pain Relieving Factors exercise   Multiple Pain  Sites No                      Pelvic Floor Special Questions - 12/20/14 0001    Pelvic Floor Internal Exam Patient confirms identification and approves PT to perform assessment of muscles and strength   Exam Type Vaginal           [facility] Adult PT Treatment/Exercise - 12/20/14 0001    Lumbar Exercises: Aerobic   Elliptical level 1 6 min   Manual Therapy   Manual Therapy Internal Pelvic Floor   Soft tissue mobilization to anterior right hip with hip extension in supine, hip adductors with hip extension and abduction; righ tobturator internist in [place]   Internal Pelvic Floor to right obturator internist, levator ani, and coccygeus with hip movement, right piriformis                PT Education - 12/20/14 1318     Education provided No          PT Short Term Goals - 12/20/14 1244    PT SHORT TERM GOAL #1   Title understand how to perform self soft tissue work to perineum   Time 4   Period Weeks   Status Achieved   PT SHORT TERM GOAL #3   Title right hip pain decreased >/= 25% due to increased mobility   Time 4   Period Weeks   Status Achieved   PT SHORT TERM GOAL #4   Title pain with intercourse decreased >/= 25%   Time 4   Period Weeks   Status Achieved           PT Long Term Goals - 12/01/14 1734    PT LONG TERM GOAL #1   Title pain with intercourse decreased >/= 75% due to increased right hip mobility and decreased pelvic floor muscle spasms   Time 12   Period Weeks   Status New   PT LONG TERM GOAL #2   Title pain with walking decresed >/= 75% due to increased right hip mobility and decreased pelvic floor muscle spasms   Time 12   Status New   PT LONG TERM GOAL #3   Title vaginal dryness decreased >/= 50% due to using moisturizers and lubricants   Time 12   Period Weeks   Status New               Plan - 12/20/14 1313    Clinical Impression Statement Patient has tightness in right piriformis and obturator internist.  When patient crosses her leg or sits the right piriformis muscle goes into spasm.  Patient has met all of her STG's.  After therpay patient has no pain in right buttocks or hip adductors. Patient was able to cross her legs  without pain.    Pt will benefit from skilled therapeutic intervention in order to improve on the following deficits Decreased range of motion;Difficulty walking;Impaired flexibility;Decreased endurance;Decreased activity tolerance;Increased fascial restricitons;Pain;Increased muscle spasms;Decreased mobility;Decreased strength   Rehab Potential Good   PT Frequency 2x / week   PT Duration 12 weeks   PT Treatment/Interventions Moist Heat;Therapeutic activities;Patient/family education;Passive range of motion;Therapeutic  exercise;Biofeedback;Manual techniques;Neuromuscular re-education;Cryotherapy;Electrical Stimulation;Functional mobility training   PT Next Visit Plan measure right hip strength and ROM; measure pelvic floor strength   PT Home Exercise Plan self soft tissue work to the perineum; pelvic floor exercise   Consulted and Agree with Plan of Care Patient  Problem List Patient Active Problem List   Diagnosis Date Noted  . Hot flashes 10/26/2014  . GAD (generalized anxiety disorder) 10/26/2014  . Peripheral neuropathy due to chemotherapy 03/12/2014  . Breast cancer of lower-outer quadrant of right female breast 09/15/2013  . Left shoulder pain - seeing Dr. Lorin Mercy 08/20/2013  . Hypertension     Johnanthony Wilden,PT 12/20/2014, 1:20 PM  [facility] [address], [place], [place], [address] Phone: [phone]   Fax:  [phone]

## 2014-12-22 ENCOUNTER — Encounter: Payer: BLUE CROSS/BLUE SHIELD | Admitting: Physical Therapy

## 2014-12-22 ENCOUNTER — Ambulatory Visit: Payer: BLUE CROSS/BLUE SHIELD | Admitting: Physical Therapy

## 2014-12-22 ENCOUNTER — Encounter: Payer: Self-pay | Admitting: Physical Therapy

## 2014-12-22 DIAGNOSIS — M25551 Pain in right hip: Secondary | ICD-10-CM

## 2014-12-22 DIAGNOSIS — M791 Myalgia, unspecified site: Secondary | ICD-10-CM

## 2014-12-22 NOTE — Patient Instructions (Signed)
Pectoral Stretch, Supine on Foam Roller   Lie on back along length of 6 inch full roller and allow arms to spread wide, keeping elbows bent 90. To increase stretch, reach arms over head. Hold _15__ seconds. Repeat _1__ times per session. Do __1_ sessions per day.  Copyright  VHI. All rights reserved.  Feet / Roller Chart   1.Horizontal half roller 2.Parallel short, full rollers 3.Perpendicular short, full rollers 4.Parallel short, full roller and ball for weight bearing 5.Parallel short, half rollers  Copyright  VHI. All rights reserved.  Piriformis Stretch, Kneeling With Foam Roller   Kneel with arms on full roller. Cross one leg in front of other leg. Rolling forward, flatten body, extending back leg. Feel stretch in hip area. Hold _15__ seconds.  Repeat __1_ times per session. Do 1___ sessions per day.  Copyright  VHI. All rights reserved.  Outer Hip: Massage   Weight on arms, roll hip over firm, full roller, moving body to floor. Feel stretch in outside of thigh and hip. Avoid twisting body. Hold _30__ seconds. Repeat _1__ times. Stretch left side. Do __1_ sessions per day.  Copyright  VHI. All rights reserved.  Lay on stomach on foam roller and move up and down front of thigh.   Lay on stomach and roll foam roller along inner thigh.  [facility] [address] [place], [place] [address] Phone # [phone] Fax [phone]

## 2014-12-22 NOTE — Therapy (Signed)
[facility] [address], [place], [place], [address] Phone: [phone]   Fax:  [phone]  Physical Therapy Treatment  Patient Details  Name: Tina [name] MRN: [id number] Date of Birth: 12/05/1960 Referring Provider:  Holley Bouche, NP  Encounter Date: 12/22/2014      PT End of Session - 12/22/14 1535    Visit Number 5   Date for PT Re-Evaluation 02/23/15   PT Start Time 1445   PT Stop Time 1530   PT Time Calculation (min) 45 min   Activity Tolerance Patient tolerated treatment well   Behavior During Therapy [facility] for tasks assessed/performed      Past Medical History  Diagnosis Date  . Leukocytopenia   . Hypertension   . Leukopenia 06/16/2013  . Needs flu shot 06/17/2013    resolved - flu shot recvd in 04/2014  . Cancer of right breast 09/03/13    Invasive Ductal Carcinoma/Ductal Carcinoma Insitu  biopsies  . Anxiety   . GERD (gastroesophageal reflux disease)   . Degenerative joint disease     back neck and shoulder  . 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 S/P radiation therapy 04/27/2014-06/07/2014    1) Right breast, 50 Gy in 25 fractions/ 2) Right breast boost, 10 Gy in 5 fractions    Past Surgical History  Procedure Laterality Date  . Cesarean section      x 3  . Carpal tunnel release  1998    rt  . Trigger finger release  2004    rt thumb  . Portacath placement  08/2013    still currently active   . Tubal ligation    . Trigger finger release left thumb  06/28/14    at orthopedic surgical center  . Wisdom tooth extraction    . Breast surgery  03/01/2014    hx right breast cancer- 3 lymph nodes removed and lumpectomy  . Robotic assisted bilateral salpingo oopherectomy Bilateral 07/09/2014    Procedure: ROBOTIC ASSISTED BILATERAL SALPINGO OOPHORECTOM; :LYSIS OF ADHESIONS;  Surgeon: Marvene Staff, MD;  Location: [facility];  Service: Gynecology;  Laterality:  Bilateral;    There were no vitals filed for this visit.  Visit Diagnosis:  Muscle pain  Right hip pain      Subjective Assessment - 12/22/14 1528    Subjective I felt good after last visit   Pertinent History Breast cancer with radiation and chemotherapy. Bulging disc in lumbar.   How long can you sit comfortably? None   How long can you stand comfortably? 15 min with right hip discomfort   How long can you walk comfortably? 15 min due to her whole body bothering her   Patient Stated Goals reduce dryness and vaginal discomfort   Currently in Pain? Yes   Pain Score 5    Pain Location Hip   Pain Orientation Right   Pain Descriptors / Indicators Aching;Dull   Pain Type Chronic pain   Pain Onset More than a month ago   Pain Frequency Intermittent   Multiple Pain Sites No                         [facility] Adult PT Treatment/Exercise - 12/22/14 0001    Lumbar Exercises: Stretches   Hip Flexor Stretch 5 reps  use foam roller on muscles in prone   ITB Stretch 5 reps  using foam roller   Piriformis Stretch  5 reps  used foam roller   Lumbar Exercises: Aerobic   Elliptical level 1 6 min   Lumbar Exercises: Seated   Other Seated Lumbar Exercises seated roll foam roll under feet   Lumbar Exercises: Supine   Glut Set --  foam roller gluteal trigger release then on pelvic floor    Shoulder Exercises: Standing   Other Standing Exercises pelvic tilt and sway in standing to reduce pain in lumbar     laying supine on foam roll to stretch the pectoralis and anterior trunk, use foam roll to massage bottom of feet relieve muscle tension.  PT gave patient information on where to purchase a foam roll.            PT Education - 12/22/14 1533    Education provided Yes   Education Details ways to use foam roll to massage and stretch spine and lower extremities   Person(s) Educated Patient   Methods Explanation;Demonstration;Tactile cues;Verbal cues;Handout    Comprehension Returned demonstration;Verbalized understanding          PT Short Term Goals - 12/20/14 1244    PT SHORT TERM GOAL #1   Title understand how to perform self soft tissue work to perineum   Time 4   Period Weeks   Status Achieved   PT SHORT TERM GOAL #3   Title right hip pain decreased >/= 25% due to increased mobility   Time 4   Period Weeks   Status Achieved   PT SHORT TERM GOAL #4   Title pain with intercourse decreased >/= 25%   Time 4   Period Weeks   Status Achieved           PT Long Term Goals - 12/01/14 1734    PT LONG TERM GOAL #1   Title pain with intercourse decreased >/= 75% due to increased right hip mobility and decreased pelvic floor muscle spasms   Time 12   Period Weeks   Status New   PT LONG TERM GOAL #2   Title pain with walking decresed >/= 75% due to increased right hip mobility and decreased pelvic floor muscle spasms   Time 12   Status New   PT LONG TERM GOAL #3   Title vaginal dryness decreased >/= 50% due to using moisturizers and lubricants   Time 12   Period Weeks   Status New               Plan - 12/22/14 1536    Clinical Impression Statement Patient has some pain being referred from trigger points in the hip, pelvic and back muscles.  Patient was able to use the foam roll to massage the muscles and abolisher her pain.  Patient has increased pain in right hip with closed packed positions.    Pt will benefit from skilled therapeutic intervention in order to improve on the following deficits Decreased range of motion;Difficulty walking;Impaired flexibility;Decreased endurance;Decreased activity tolerance;Increased fascial restricitons;Pain;Increased muscle spasms;Decreased mobility;Decreased strength   Rehab Potential Good   PT Frequency 2x / week   PT Duration 12 weeks   PT Treatment/Interventions Moist Heat;Therapeutic activities;Patient/family education;Passive range of motion;Therapeutic exercise;Biofeedback;Manual  techniques;Neuromuscular re-education;Cryotherapy;Electrical Stimulation;Functional mobility training   PT Next Visit Plan measure right hip strength and ROM; measure pelvic floor strength   PT Home Exercise Plan progress as needed   Consulted and Agree with Plan of Care Patient        Problem List Patient Active Problem List   Diagnosis Date Noted  .  Hot flashes 10/26/2014  . GAD (generalized anxiety disorder) 10/26/2014  . Peripheral neuropathy due to chemotherapy 03/12/2014  . Breast cancer of lower-outer quadrant of right female breast 09/15/2013  . Left shoulder pain - seeing Dr. Lorin Mercy 08/20/2013  . Hypertension     Deborrah Mabin,PT 12/22/2014, 3:40 PM  [facility] [address], [place], [place], [address] Phone: [phone]   Fax:  [phone]

## 2014-12-28 ENCOUNTER — Ambulatory Visit: Payer: BLUE CROSS/BLUE SHIELD | Admitting: Physical Therapy

## 2014-12-28 ENCOUNTER — Other Ambulatory Visit: Payer: Self-pay | Admitting: Family Medicine

## 2014-12-28 DIAGNOSIS — M25551 Pain in right hip: Secondary | ICD-10-CM

## 2014-12-28 DIAGNOSIS — M791 Myalgia, unspecified site: Secondary | ICD-10-CM

## 2014-12-28 NOTE — Therapy (Signed)
[facility] [address], [place], [place], [address] Phone: [phone]   Fax:  [phone]  Physical Therapy Treatment  Patient Details  Name: Tina [name] MRN: [id number] Date of Birth: April 26, 1961 Referring Provider:  Holley Bouche, NP  Encounter Date: 12/28/2014      PT End of Session - 12/28/14 1452    Visit Number 6   Date for PT Re-Evaluation 02/23/15   PT Start Time 1400   PT Stop Time 1445   PT Time Calculation (min) 45 min   Activity Tolerance Patient tolerated treatment well   Behavior During Therapy [facility] for tasks assessed/performed      Past Medical History  Diagnosis Date  . Leukocytopenia   . Hypertension   . Leukopenia 06/16/2013  . Needs flu shot 06/17/2013    resolved - flu shot recvd in 04/2014  . Cancer of right breast 09/03/13    Invasive Ductal Carcinoma/Ductal Carcinoma Insitu  biopsies  . Anxiety   . GERD (gastroesophageal reflux disease)   . Degenerative joint disease     back neck and shoulder  . 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 S/P radiation therapy 04/27/2014-06/07/2014    1) Right breast, 50 Gy in 25 fractions/ 2) Right breast boost, 10 Gy in 5 fractions    Past Surgical History  Procedure Laterality Date  . Cesarean section      x 3  . Carpal tunnel release  1998    rt  . Trigger finger release  2004    rt thumb  . Portacath placement  08/2013    still currently active   . Tubal ligation    . Trigger finger release left thumb  06/28/14    at orthopedic surgical center  . Wisdom tooth extraction    . Breast surgery  03/01/2014    hx right breast cancer- 3 lymph nodes removed and lumpectomy  . Robotic assisted bilateral salpingo oopherectomy Bilateral 07/09/2014    Procedure: ROBOTIC ASSISTED BILATERAL SALPINGO OOPHORECTOM; :LYSIS OF ADHESIONS;  Surgeon: Marvene Staff, MD;  Location: [facility];  Service: Gynecology;  Laterality:  Bilateral;    There were no vitals filed for this visit.  Visit Diagnosis:  Muscle pain  Right hip pain      Subjective Assessment - 12/28/14 1409    Subjective I feel good. I feel my vaginal dryness has improved by 2%.  I used to have dryness every day.  I now feel moisture in the vaginal areal 3 times per day. Patient reports no vaginal pain with intercourse but pain in right hip.    Currently in Pain? Yes   Pain Score 4    Pain Location Hip   Pain Orientation Right   Pain Descriptors / Indicators Aching;Dull   Pain Type Chronic pain   Pain Onset More than a month ago   Pain Frequency Intermittent   Aggravating Factors  sitting, movement with pelvis going to the left   Pain Relieving Factors exercise   Multiple Pain Sites No            [facility] PT Assessment - 12/28/14 0001    PROM   Right Hip External Rotation  60   Right Hip   Right Hip Flexion 125                  Pelvic Floor Special Questions - 12/28/14 0001    Pelvic Floor Internal Exam Patient  confirms identification and approves PT to perform assessment of muscles and strength   Exam Type Vaginal   Strength strong squeeze, against strong resistance           [facility] Adult PT Treatment/Exercise - 12/28/14 0001    Lumbar Exercises: Aerobic   Elliptical level 1 5 min   Manual Therapy   Manual Therapy Internal Pelvic Floor   Internal Pelvic Floor to right obturator internist, levator ani, and coccygeus with hip movement, right piriformis                PT Education - 12/28/14 1452    Education provided Yes   Education Details review information on vaginal moisturizers and lubricants   Person(s) Educated Patient   Methods Explanation;Handout   Comprehension Verbalized understanding          PT Short Term Goals - 12/20/14 1244    PT SHORT TERM GOAL #1   Title understand how to perform self soft tissue work to perineum   Time 4   Period Weeks   Status Achieved   PT SHORT TERM  GOAL #3   Title right hip pain decreased >/= 25% due to increased mobility   Time 4   Period Weeks   Status Achieved   PT SHORT TERM GOAL #4   Title pain with intercourse decreased >/= 25%   Time 4   Period Weeks   Status Achieved           PT Long Term Goals - 12/28/14 1453    PT LONG TERM GOAL #1   Title pain with intercourse decreased >/= 75% due to increased right hip mobility and decreased pelvic floor muscle spasms   Time 12   Period Weeks   Status On-going  no pain with pelvic floor but pain in right hip   PT LONG TERM GOAL #2   Title pain with walking decresed >/= 75% due to increased right hip mobility and decreased pelvic floor muscle spasms   Time 12   Status On-going   PT LONG TERM GOAL #3   Title vaginal dryness decreased >/= 50% due to using moisturizers and lubricants   Time 12   Period Weeks   Status On-going  2%               Plan - 12/28/14 1455    Clinical Impression Statement Patient has increased right hip PROM to full for flexion and limited by 10 degrees with ER.  Patient pelvic floor strength is 5/5.  Patient has no pain in vaginal muscles with intercourse but in right hip. Patient need to review with vaginal moisturizers and lubricants due to it improving by 2% but daily instead of not moisture.    Pt will benefit from skilled therapeutic intervention in order to improve on the following deficits Decreased range of motion;Difficulty walking;Impaired flexibility;Decreased endurance;Decreased activity tolerance;Increased fascial restricitons;Pain;Increased muscle spasms;Decreased mobility;Decreased strength   Rehab Potential Good   PT Frequency 2x / week   PT Duration 12 weeks   PT Treatment/Interventions Moist Heat;Therapeutic activities;Patient/family education;Passive range of motion;Therapeutic exercise;Biofeedback;Manual techniques;Neuromuscular re-education;Cryotherapy;Electrical Stimulation;Functional mobility training   PT Next Visit  Plan test right hip strength. soft tissue work   PT Home Exercise Plan progress as needed   Consulted and Agree with Plan of Care Patient   PT Plan see above        Problem List Patient Active Problem List   Diagnosis Date Noted  . Hot flashes 10/26/2014  .  GAD (generalized anxiety disorder) 10/26/2014  . Peripheral neuropathy due to chemotherapy 03/12/2014  . Breast cancer of lower-outer quadrant of right female breast 09/15/2013  . Left shoulder pain - seeing Dr. Lorin Mercy 08/20/2013  . Hypertension     Lyndel Sarate,PT 12/28/2014, 2:59 PM  [facility] [address], [place], [place], [address] Phone: [phone]   Fax:  [phone]

## 2014-12-30 ENCOUNTER — Encounter: Payer: Self-pay | Admitting: Physical Therapy

## 2014-12-30 ENCOUNTER — Ambulatory Visit: Payer: BLUE CROSS/BLUE SHIELD | Attending: Adult Health | Admitting: Physical Therapy

## 2014-12-30 DIAGNOSIS — M25551 Pain in right hip: Secondary | ICD-10-CM | POA: Insufficient documentation

## 2014-12-30 DIAGNOSIS — M791 Myalgia, unspecified site: Secondary | ICD-10-CM

## 2014-12-30 NOTE — Patient Instructions (Signed)
Strengthening: Hip Abduction (Side-Lying)   Tighten muscles on front of left thigh, then lift leg __6__ inches from surface, keeping knee locked.  Repeat _15-30___ times per set. Do _1___ sets per session. Do _1___ sessions per day.  http://orth.exer.us/622   Copyright  VHI. All rights reserved.   [facility] [address] [place], [place] [address] Phone # [phone] Fax [phone]

## 2014-12-30 NOTE — Therapy (Signed)
[facility] [address] [place], [place], [address] Phone: [phone]   Fax:  [phone]  Physical Therapy Treatment  Patient Details  Name: Tina [name] MRN: [id number] Date of Birth: Aug 26, 1960 Referring Provider:  Holley Bouche, NP  Encounter Date: 12/30/2014      PT End of Session - 12/30/14 1441    Visit Number 7   Date for PT Re-Evaluation 02/23/15   PT Start Time 1400   PT Stop Time 1445   PT Time Calculation (min) 45 min   Activity Tolerance Patient tolerated treatment well   Behavior During Therapy [facility] for tasks assessed/performed      Past Medical History  Diagnosis Date  . Leukocytopenia   . Hypertension   . Leukopenia 06/16/2013  . Needs flu shot 06/17/2013    resolved - flu shot recvd in 04/2014  . Cancer of right breast 09/03/13    Invasive Ductal Carcinoma/Ductal Carcinoma Insitu  biopsies  . Anxiety   . GERD (gastroesophageal reflux disease)   . Degenerative joint disease     back neck and shoulder  . 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 S/P radiation therapy 04/27/2014-06/07/2014    1) Right breast, 50 Gy in 25 fractions/ 2) Right breast boost, 10 Gy in 5 fractions    Past Surgical History  Procedure Laterality Date  . Cesarean section      x 3  . Carpal tunnel release  1998    rt  . Trigger finger release  2004    rt thumb  . Portacath placement  08/2013    still currently active   . Tubal ligation    . Trigger finger release left thumb  06/28/14    at orthopedic surgical center  . Wisdom tooth extraction    . Breast surgery  03/01/2014    hx right breast cancer- 3 lymph nodes removed and lumpectomy  . Robotic assisted bilateral salpingo oopherectomy Bilateral 07/09/2014    Procedure: ROBOTIC ASSISTED BILATERAL SALPINGO OOPHORECTOM; :LYSIS OF ADHESIONS;  Surgeon: Marvene Staff, MD;  Location: [facility];  Service: Gynecology;  Laterality:  Bilateral;    There were no vitals filed for this visit.  Visit Diagnosis:  Muscle pain  Right hip pain      Subjective Assessment - 12/30/14 1404    Subjective I got the foam roll and have been using it. Right hip hurst with movement. I can open my legs now. Yesterday I had a strong pain in my pelvis and strong pain in my foot.    Pertinent History Breast cancer with radiation and chemotherapy. Bulging disc in lumbar.   How long can you sit comfortably? None   How long can you stand comfortably? 15 min with right hip discomfort   How long can you walk comfortably? 15 min due to her whole body bothering her   Patient Stated Goals reduce dryness and vaginal discomfort   Currently in Pain? Yes   Pain Score 5    Pain Location Hip   Pain Orientation Right   Pain Descriptors / Indicators Aching;Dull   Pain Type Chronic pain   Pain Onset More than a month ago   Pain Frequency Intermittent   Aggravating Factors  sitting, movement with pelvis going to the left   Pain Relieving Factors exercise   Multiple Pain Sites No            [facility] PT Assessment - 12/30/14  0001    Strength   Right Hip External Rotation  --  IR/ER is 5/5   Right Hip ABduction 3+/5                     [facility] Adult PT Treatment/Exercise - 12/30/14 0001    Lumbar Exercises: Aerobic   Elliptical level 1 5 min   Lumbar Exercises: Supine   Straight Leg Raises Limitations left sidely right hip abd 30 times   Shoulder Exercises: Standing   Other Standing Exercises stand and roll ball under right foot to elongate the hamstring and relieve pelvic floor spasm   Manual Therapy   Manual Therapy Soft tissue mobilization   Soft tissue mobilization right pirifornis, coccygeus, gluteal, tensor fascia latta, around right greater trochanter, and along right SI joint                  PT Short Term Goals - 12/20/14 1244    PT SHORT TERM GOAL #1   Title understand how to perform self soft tissue  work to perineum   Time 4   Period Weeks   Status Achieved   PT SHORT TERM GOAL #3   Title right hip pain decreased >/= 25% due to increased mobility   Time 4   Period Weeks   Status Achieved   PT SHORT TERM GOAL #4   Title pain with intercourse decreased >/= 25%   Time 4   Period Weeks   Status Achieved           PT Long Term Goals - 12/28/14 1453    PT LONG TERM GOAL #1   Title pain with intercourse decreased >/= 75% due to increased right hip mobility and decreased pelvic floor muscle spasms   Time 12   Period Weeks   Status On-going  no pain with pelvic floor but pain in right hip   PT LONG TERM GOAL #2   Title pain with walking decresed >/= 75% due to increased right hip mobility and decreased pelvic floor muscle spasms   Time 12   Status On-going   PT LONG TERM GOAL #3   Title vaginal dryness decreased >/= 50% due to using moisturizers and lubricants   Time 12   Period Weeks   Status On-going  2%               Plan - 12/30/14 1441    Clinical Impression Statement Patient has weakness in right hip abductors and increased strength of right hip rotators.  Patient continues to have trigger points in right hip musculature, coccygeus, and gluteal.  Patient is progressing her strength of hip and pelvic floor.     Pt will benefit from skilled therapeutic intervention in order to improve on the following deficits Decreased range of motion;Difficulty walking;Impaired flexibility;Decreased endurance;Decreased activity tolerance;Increased fascial restricitons;Pain;Increased muscle spasms;Decreased mobility;Decreased strength   Rehab Potential Good   PT Frequency 2x / week   PT Duration 12 weeks   PT Treatment/Interventions Moist Heat;Therapeutic activities;Patient/family education;Passive range of motion;Therapeutic exercise;Biofeedback;Manual techniques;Neuromuscular re-education;Cryotherapy;Electrical Stimulation;Functional mobility training   PT Next Visit Plan  review exercises, soft tissue work   PT Home Exercise Plan progress as needed   Consulted and Agree with Plan of Care Patient   PT Plan see above        Problem List Patient Active Problem List   Diagnosis Date Noted  . Hot flashes 10/26/2014  . GAD (generalized anxiety disorder) 10/26/2014  . Peripheral neuropathy  due to chemotherapy 03/12/2014  . Breast cancer of lower-outer quadrant of right female breast 09/15/2013  . Left shoulder pain - seeing Dr. Lorin Mercy 08/20/2013  . Hypertension     Coady Train,PT 12/30/2014, 2:46 PM  [facility] [address] [place], [place], [address] Phone: [phone]   Fax:  [phone]

## 2015-01-05 ENCOUNTER — Ambulatory Visit: Payer: BLUE CROSS/BLUE SHIELD | Admitting: Physical Therapy

## 2015-01-05 ENCOUNTER — Encounter: Payer: Self-pay | Admitting: Physical Therapy

## 2015-01-05 DIAGNOSIS — M791 Myalgia, unspecified site: Secondary | ICD-10-CM

## 2015-01-05 DIAGNOSIS — M25551 Pain in right hip: Secondary | ICD-10-CM

## 2015-01-05 NOTE — Therapy (Signed)
[facility] [address] [place], [place], [address] Phone: [phone]   Fax:  [phone]  Physical Therapy Treatment  Patient Details  Name: Tina [name] MRN: [id number] Date of Birth: 1961-01-27 Referring Provider:  Holley Bouche, NP  Encounter Date: 01/05/2015      PT End of Session - 01/05/15 1533    Visit Number 8   Date for PT Re-Evaluation 02/23/15   PT Start Time 1445   PT Stop Time 1433   PT Time Calculation (min) 1428 min   Activity Tolerance Patient tolerated treatment well   Behavior During Therapy [facility] for tasks assessed/performed      Past Medical History  Diagnosis Date  . Leukocytopenia   . Hypertension   . Leukopenia 06/16/2013  . Needs flu shot 06/17/2013    resolved - flu shot recvd in 04/2014  . Cancer of right breast 09/03/13    Invasive Ductal Carcinoma/Ductal Carcinoma Insitu  biopsies  . Anxiety   . GERD (gastroesophageal reflux disease)   . Degenerative joint disease     back neck and shoulder  . 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 S/P radiation therapy 04/27/2014-06/07/2014    1) Right breast, 50 Gy in 25 fractions/ 2) Right breast boost, 10 Gy in 5 fractions    Past Surgical History  Procedure Laterality Date  . Cesarean section      x 3  . Carpal tunnel release  1998    rt  . Trigger finger release  2004    rt thumb  . Portacath placement  08/2013    still currently active   . Tubal ligation    . Trigger finger release left thumb  06/28/14    at orthopedic surgical center  . Wisdom tooth extraction    . Breast surgery  03/01/2014    hx right breast cancer- 3 lymph nodes removed and lumpectomy  . Robotic assisted bilateral salpingo oopherectomy Bilateral 07/09/2014    Procedure: ROBOTIC ASSISTED BILATERAL SALPINGO OOPHORECTOM; :LYSIS OF ADHESIONS;  Surgeon: Marvene Staff, MD;  Location: [facility];  Service: Gynecology;  Laterality:  Bilateral;    There were no vitals filed for this visit.  Visit Diagnosis:  Muscle pain  Right hip pain      Subjective Assessment - 01/05/15 1450    Subjective I saw my Dr. Lorin Mercy.  He said I have a positive ANA test.  They are referring me to a rhuematologist.  The soft tissue work helped my right hip. My rectum felt more relaxed. I was able to pass gas better. I have very little pain with intercourse and is at the initial penetration but after  several seconds there is no pain.  I have pain in right hip with intercourse due to the positioning.    Pertinent History Breast cancer with radiation and chemotherapy. Bulging disc in lumbar.   How long can you sit comfortably? None   How long can you stand comfortably? 15 min with right hip discomfort   How long can you walk comfortably? 15 min due to her whole body bothering her   Patient Stated Goals reduce dryness and vaginal discomfort   Currently in Pain? Yes   Pain Score 2    Pain Location Perineum   Pain Orientation Mid   Pain Descriptors / Indicators Squeezing   Pain Type Chronic pain   Pain Onset More than a month ago   Aggravating Factors  intercourse   Pain Relieving Factors no intercourse   Multiple Pain Sites No   Pain Score 6   Pain Location Hip   Pain Orientation Right   Pain Descriptors / Indicators Dull;Aching   Pain Type Chronic pain   Pain Onset More than a month ago   Pain Frequency Intermittent   Aggravating Factors  having legs open with intercourse   Pain Relieving Factors rest            [facility] PT Assessment - 01/05/15 0001    PROM   Right/Left Hip --  flexion 125 degrees   Right Hip External Rotation  60   Strength   Right Hip ABduction 4-/5                     [facility] Adult PT Treatment/Exercise - 01/05/15 0001    Lumbar Exercises: Aerobic   Elliptical level 1 5 min   Manual Therapy   Manual Therapy Soft tissue mobilization   Soft tissue mobilization right pirifornis, coccygeus,  gluteal, tensor fascia latta, around right greater trochanter, and along right SI joint, and right hip adductors                PT Education - 01/05/15 1533    Education provided No          PT Short Term Goals - 12/20/14 1244    PT SHORT TERM GOAL #1   Title understand how to perform self soft tissue work to perineum   Time 4   Period Weeks   Status Achieved   PT SHORT TERM GOAL #3   Title right hip pain decreased >/= 25% due to increased mobility   Time 4   Period Weeks   Status Achieved   PT SHORT TERM GOAL #4   Title pain with intercourse decreased >/= 25%   Time 4   Period Weeks   Status Achieved           PT Long Term Goals - 01/05/15 1532    PT LONG TERM GOAL #1   Title pain with intercourse decreased >/= 75% due to increased right hip mobility and decreased pelvic floor muscle spasms   Period Weeks   Status Achieved   PT LONG TERM GOAL #2   Title pain with walking decresed >/= 75% due to increased right hip mobility and decreased pelvic floor muscle spasms   Time 12   Status On-going  70% better   PT LONG TERM GOAL #3   Title vaginal dryness decreased >/= 50% due to using moisturizers and lubricants   Time 12   Period Weeks   Status Achieved  75% better               Plan - 01/05/15 1533    Clinical Impression Statement Patient is able to walk without a limp on the right.  Patient vaginal dryness has improved by 70%.  Patient reports pain with walking has decreased by 70%. Patient has 125 degrees of right hip flexion and 60degrees of right hip ER Passively. Patient continues to have palpable tenderness located in righ thi pmusculature, obturator internist, and levator ani.  Patient will benefit with continued  softe tissue work to the righ thip and pelvic floor. Patient has met LTG 1 and 3.    Pt will benefit from skilled therapeutic intervention in order to improve on the following deficits Decreased range of motion;Difficulty  walking;Impaired flexibility;Decreased endurance;Decreased activity tolerance;Increased fascial restricitons;Pain;Increased muscle spasms;Decreased  mobility;Decreased strength   Rehab Potential Good   PT Frequency 2x / week   PT Duration 12 weeks   PT Treatment/Interventions Moist Heat;Therapeutic activities;Patient/family education;Passive range of motion;Therapeutic exercise;Biofeedback;Manual techniques;Neuromuscular re-education;Cryotherapy;Electrical Stimulation;Functional mobility training   PT Next Visit Plan soft tissue work   PT Home Exercise Plan progress as needed   Consulted and Agree with Plan of Care Patient   PT Plan see above        Problem List Patient Active Problem List   Diagnosis Date Noted  . Hot flashes 10/26/2014  . GAD (generalized anxiety disorder) 10/26/2014  . Peripheral neuropathy due to chemotherapy 03/12/2014  . Breast cancer of lower-outer quadrant of right female breast 09/15/2013  . Left shoulder pain - seeing Dr. Lorin Mercy 08/20/2013  . Hypertension     GRAY,CHERYL,PT 01/05/2015, 3:38 PM  [facility] [address], [place], [place], [address] Phone: [phone]   Fax:  [phone]

## 2015-01-10 ENCOUNTER — Telehealth: Payer: Self-pay | Admitting: Family Medicine

## 2015-01-10 ENCOUNTER — Telehealth: Payer: Self-pay | Admitting: *Deleted

## 2015-01-10 NOTE — Telephone Encounter (Signed)
REQUESTED A RETURN CALL FOR MORE INFORMATION.

## 2015-01-10 NOTE — Telephone Encounter (Signed)
[facility] - Client [place]    --------------------------------------------------------------------------------   Patient Name: Tina [name]  Gender: Female  DOB: 1960/08/20   Age: 54 Y 3 M 15 D  Return Phone Number: [phone] (Primary)  Address:     City/State/Zip:  [place]     Client [facility] - Client  Client Site [facility] - Day  Physician Colin Benton   Contact Type Call  Call Type Triage / Clinical  Relationship To Patient Self  Appointment Disposition EMR Appointment Scheduled  Info pasted into Epic Yes  Return Phone Number [phone] (Primary)  Chief Complaint FAINTING or [place]  Initial Comment caller states she had an episode where she vomited and then fainted, unsure if she had a seizure  [place] will be back in [place] Monday   PreDisposition Call Doctor       Nurse Assessment  Nurse: Buck Mam, RN, Trish Date/Time (Eastern Time): 01/10/2015 9:06:22 AM  Confirm and document reason for call. If symptomatic, describe symptoms. ---Patient is calling for self and caller states she had an episode where she vomited and then fainted, unsure if she had a seizure. This happened Friday. Saturday was fatigue and nauseated. Sunday and Monday feels better. She was sitting down when loss of concisions. was out for a few sec. She has had the feeling to pass out in the last month. She is changing her eating habits.    Has the patient traveled out of the country within the last 30 days? ---No    Does the patient require triage? ---Yes    Related visit to physician within the last 2 weeks? ---Yes      started prednisone for hip and ankle problem  Does the PT have any chronic conditions? (i.e. diabetes, asthma, etc.) ---Yes    List chronic conditions. ---rheumatoid and hypertension    Did the patient indicate they were pregnant? ---No           Guidelines          Guideline Title Affirmed Question Affirmed Notes Nurse Date/Time (Eastern Time)  Fainting [1] All other patients AND [2] now alert and feels fine (Exception: SIMPLE FAINT due to stress, pain, prolonged standing, or suddenly standing)    Buck Mam, RN, Trish 01/10/2015 9:17:27 AM    Disp. Time Eilene Ghazi Time) Disposition Final User    01/10/2015 9:03:42 AM Send to Urgent Queue   [facility] [facility]      01/10/2015 9:20:09 AM See Physician within 4 Hours (or PCP triage) Yes Buck Mam, RN, Particia Lather Understands: Yes  Disagree/Comply: Comply       Care Advice Given Per Guideline        SEE PHYSICIAN WITHIN 4 HOURS (or PCP triage): DRIVING: Another adult should drive. CALL BACK IF: * You become worse. CARE ADVICE given per Fainting (Adult) guideline.    After Care Instructions Given        Call Event Type User Date / Time Description        --------------------------------------------------------------------------------         Comments  User: Rubie Maid, RN Date/Time Eilene Ghazi Time): 01/10/2015 9:27:50 AM  Patient feels fine now and is out of town on vacation. Patient aware of 4 hour outcome and aware needs to be seen. Patient not wanting to be seen until she gets back from vacation.  Patient will be back Monday. Patient highly advised to be seen and to watch for any signs of anything abnormal.    Referrals  REFERRED TO PCP OFFICE

## 2015-01-10 NOTE — Telephone Encounter (Signed)
VOICE MAIL FROM 9:43AM/ RETURN CALL AT 10:54AM- PT. HAS HAD THREE EPISODES (11/23/14, MAY 2016, AND 01/07/15) SUDDEN HEADACHES, FEELING HOT AND THROWING UP. NO FEVER. PT. IS OUT OF TOWN AND HAS ALSO CONTACTED HER PRIMARY CARE PHYSICIAN, WHO WILL SEE PT. ON Monday WHEN SHE RETURNS TO [place]. PT. WAS ALSO INSTRUCTED BY PRIMARY CARE PHYSICIAN TO GO TO THE EMERGENCY DEPARTMENT IF CONDITION WORSENS.

## 2015-01-10 NOTE — Telephone Encounter (Signed)
Patient scheduled with MD Maudie Mercury Monday

## 2015-01-17 ENCOUNTER — Encounter: Payer: Self-pay | Admitting: Family Medicine

## 2015-01-17 ENCOUNTER — Ambulatory Visit (INDEPENDENT_AMBULATORY_CARE_PROVIDER_SITE_OTHER): Payer: BLUE CROSS/BLUE SHIELD | Admitting: Family Medicine

## 2015-01-17 VITALS — BP 126/88 | HR 96 | Temp 99.5°F | Ht 64.0 in | Wt 165.5 lb

## 2015-01-17 DIAGNOSIS — R51 Headache: Secondary | ICD-10-CM | POA: Diagnosis not present

## 2015-01-17 DIAGNOSIS — R55 Syncope and collapse: Secondary | ICD-10-CM

## 2015-01-17 DIAGNOSIS — R519 Headache, unspecified: Secondary | ICD-10-CM

## 2015-01-17 MED ORDER — SUMATRIPTAN SUCCINATE 25 MG PO TABS: 25.0000 mg | ORAL_TABLET | ORAL | Status: DC | PRN

## 2015-01-17 NOTE — Patient Instructions (Signed)
-  We placed a referral for you as discussed for the MRI and to see the neurologist about the headaches and lighthededness. It usually takes about 1-2 weeks to process and schedule this referral. If you have not heard from [place] regarding this appointment in 2 weeks please contact our office.  -try the imitrex for the headaches  -follow up in 3 months or as needed

## 2015-01-17 NOTE — Progress Notes (Signed)
HPI:  Acute visit for:  Headache, Nausea and Vomiting: -started: 1.5 weeks ago, headache last for 2 days - R sided, temporoparietal, occ tingling of R scalp, occ shock like pain in this area -symptoms: nausea, vomiting, presyncope after emesis once, associated with a headache for several days on the R side, ? Syncope for a few seconds after throwing up, felt hot and clammy, question brief syncope 1.5 weeks ago -denies: fevers, CP, sob, palpitations, sinus congestion, weakness, numbness, vision or hearing issues, speech issues -had a similar headache several weeks ago accompanied by nausea and vomiting -has hx of of headaches, through up with them remotely, but has not had bad headaches in 20 year, has a headache about 1x per month normally - but usually bitemporal -has f/u with onc to see if related to her medication  Chronic issues:  Hot flashes/anxiety: -started right after oophorectomy -effexor helped and dose increased 08/2014 -reports: doing well, hot flashes imprved -denies: sig sedation, worsening anxiety, depression, SI  HTN/HLD: -lisinopril-hctz40-25, norvasc 5 mg  -denies: CP, SOB, DOE, swelling, palpitations -live strong exercise program; doing a healthy diet - has made sig changes -FASTING today for her cholesterol panel  ? OSA: -snores at night and sometimes husband says stops breathing -feels like she rests pretty well, without daytime somnolence -sleep study ordered last visit and this is scheduled  Breast Ca: -s/p surgery, chemo and radiation -followed by onc - current meds: tamoxifen - followed by cardio-onc clinic as well  Polyarthralgia: -neck pain, seeing Dr. Lorin Mercy -seeing rheumatology for this  ROS: See pertinent positives and negatives per HPI.  Past Medical History  Diagnosis Date  . Leukocytopenia   . Hypertension   . Leukopenia 06/16/2013  . Needs flu shot 06/17/2013    resolved - flu shot recvd in 04/2014  . Cancer of right breast 09/03/13     Invasive Ductal Carcinoma/Ductal Carcinoma Insitu  biopsies  . Anxiety   . GERD (gastroesophageal reflux disease)   . Degenerative joint disease     back neck and shoulder  . 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 S/P radiation therapy 04/27/2014-06/07/2014    1) Right breast, 50 Gy in 25 fractions/ 2) Right breast boost, 10 Gy in 5 fractions    Past Surgical History  Procedure Laterality Date  . Cesarean section      x 3  . Carpal tunnel release  1998    rt  . Trigger finger release  2004    rt thumb  . Portacath placement  08/2013    still currently active   . Tubal ligation    . Trigger finger release left thumb  06/28/14    at [facility]opedic surgical center  . Wisdom tooth extraction    . Breast surgery  03/01/2014    hx right breast cancer- 3 lymph nodes removed and lumpectomy  . Robotic assisted bilateral salpingo oopherectomy Bilateral 07/09/2014    Procedure: ROBOTIC ASSISTED BILATERAL SALPINGO OOPHORECTOM; :LYSIS OF ADHESIONS;  Surgeon: Marvene Staff, MD;  Location: [facility];  Service: Gynecology;  Laterality: Bilateral;    Family History  Problem Relation Age of Onset  . Hypertension Mother   . Dementia Mother     small vessel disease  . Cancer Mother 30    cervical  cancer  . Cancer Father 30    prostate ca  . Diabetes Father   . Arthritis Father     History   Social History  .  Marital Status: Married    Spouse Name: N/A  . Number of Children: 1  . Years of Education: N/A   Occupational History  .      homemaker   Social History Main Topics  . Smoking status: Former Smoker -- 1.00 packs/day for 15 years    Types: Cigars, Cigarettes    Quit date: 02/23/2004  . Smokeless tobacco: Never Used     Comment: remote smoking history  . Alcohol Use: 0.0 oz/week    0 Shots of liquor per week     Comment: occ   . Drug Use: Yes     Comment: [place]  . Sexual Activity: Yes   Other Topics Concern  . None   Social History  Narrative   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      Home Situation: lives with husband and 8 yo son      Spiritual Beliefs: Christian      Lifestyle: elliptical 3-4 times per week; working on diet as well over last year in 2014              Current outpatient prescriptions:  .  amLODipine (NORVASC) 5 MG tablet, TAKE ONE TABLET BY MOUTH ONCE DAILY, Disp: 30 tablet, Rfl: 2 .  aspirin 81 MG tablet, Take 81 mg by mouth daily., Disp: , Rfl:  .  b complex vitamins capsule, Take 1 capsule by mouth daily., Disp: , Rfl:  .  BIOTIN 5000 PO, Take 1 capsule by mouth daily. , Disp: , Rfl:  .  Calcium Carbonate-Vit D-Min (CALTRATE 600+D PLUS MINERALS PO), Take 1 tablet by mouth daily., Disp: , Rfl:  .  Cholecalciferol (VITAMIN D3) 10000 UNITS capsule, Take 10,000 Units by mouth daily., Disp: , Rfl:  .  diphenhydrAMINE (SOMINEX) 25 MG tablet, Take 25 mg by mouth at bedtime as needed for sleep., Disp: , Rfl:  .  gabapentin (NEURONTIN) 100 MG capsule, TAKE THREE CAPSULES BY MOUTH THREE TIMES DAILY, Disp: 270 capsule, Rfl: 1 .  ibuprofen (ADVIL,MOTRIN) 800 MG tablet, Take 1 tablet (800 mg total) by mouth every 8 (eight) hours as needed for mild pain., Disp: 30 tablet, Rfl: 4 .  lidocaine-prilocaine (EMLA) cream, Apply topically as needed. (Patient taking differently: Apply topically as needed. Uses when she gets treatment), Disp: 30 g, Rfl: 6 .  lisinopril-hydrochlorothiazide (PRINZIDE,ZESTORETIC) 20-12.5 MG per tablet, TAKE TWO TABLETS BY MOUTH ONCE DAILY, Disp: 120 tablet, Rfl: 0 .  loratadine (CLARITIN) 10 MG tablet, Take 10 mg by mouth daily., Disp: , Rfl:  .  omeprazole (PRILOSEC) 40 MG capsule, Take 1 capsule (40 mg total) by mouth daily., Disp: 30 capsule, Rfl: 2 .  tamoxifen (NOLVADEX) 20 MG tablet, Take 1 tablet (20 mg total) by mouth daily., Disp: 30 tablet, Rfl: 2 .  venlafaxine XR (EFFEXOR-XR) 75 MG 24 hr capsule, TAKE ONE CAPSULE BY MOUTH DAILY WITH BREAKFAST., Disp: 30 capsule, Rfl: 1 .  VITAMIN  E EX, Apply topically. Vaginal suppository, Disp: , Rfl:  .  VITAMIN E PO, Take 1 capsule by mouth daily. , Disp: , Rfl:  .  LORazepam (ATIVAN) 0.5 MG tablet, Take 1 tablet (0.5 mg total) by mouth every 6 (six) hours as needed (Nausea or vomiting). (Patient not taking: Reported on 01/17/2015), Disp: 30 tablet, Rfl: 0 .  SUMAtriptan (IMITREX) 25 MG tablet, Take 1 tablet (25 mg total) by mouth every 2 (two) hours as needed for migraine. May repeat in 2 hours if needed. Do not take more  then 2 tablets in 24 hours., Disp: 10 tablet, Rfl: 0 No current facility-administered medications for this visit.  Facility-Administered Medications Ordered in Other Visits:  .  prochlorperazine (COMPAZINE) injection 10 mg, 10 mg, Intravenous, Q6H PRN, Minette Headland, NP, 10 mg at 12/26/13 0917  EXAM:  Filed Vitals:   01/17/15 1304  BP: 126/88  Pulse: 96  Temp: 99.5 F (37.5 C)    Body mass index is 28.39 kg/(m^2).  GENERAL: vitals reviewed and listed above, alert, oriented, appears well hydrated and in no acute distress  HEENT: atraumatic, conjunttiva clear, PERRLA, vision grossly intact, no obvious abnormalities on inspection of external nose and ears, no TTP or bruits over temporal arteries  NECK: no obvious masses on inspection, no bruits  LUNGS: clear to auscultation bilaterally, no wheezes, rales or rhonchi, good air movement  CV: HRRR, no peripheral edema  MS: moves all extremities without noticeable abnormality  PSYCH: pleasant and cooperative, no obvious depression or anxiety  NEURO: normal gait, speech and thought processing grossly normal, CN II-XII grossly intact, PERRLA, finger to nose normal  ASSESSMENT AND PLAN:  Discussed the following assessment and plan:  Persistent headaches - Plan: MR Brain W Wo Contrast, Ambulatory referral to Neurology, SUMAtriptan (IMITREX) 25 MG tablet  Syncope, unspecified syncope type - Plan: MR Brain W Wo Contrast, Ambulatory referral to  Neurology  -symptoms c/w with migraine with likely vagal presyncope or syncope that occurred directly after emesis -advised MRI, pt wants to see neuro for eval - referral placed -trial triptan if recurs -return and emergency precuations -Patient advised to return or notify a doctor immediately if symptoms worsen or persist or new concerns arise.  There are no Patient Instructions on file for this visit.   Colin Benton R.

## 2015-01-17 NOTE — Progress Notes (Signed)
Pre visit review using our clinic review tool, if applicable. No additional management support is needed unless otherwise documented below in the visit note. 

## 2015-01-19 ENCOUNTER — Ambulatory Visit: Payer: BLUE CROSS/BLUE SHIELD | Admitting: Physical Therapy

## 2015-01-19 ENCOUNTER — Encounter: Payer: Self-pay | Admitting: Physical Therapy

## 2015-01-19 DIAGNOSIS — M791 Myalgia, unspecified site: Secondary | ICD-10-CM

## 2015-01-19 DIAGNOSIS — M25551 Pain in right hip: Secondary | ICD-10-CM

## 2015-01-19 NOTE — Therapy (Signed)
[facility] [address], [place], [place], [address] Phone: [phone]   Fax:  [phone]  Physical Therapy Treatment  Patient Details  Name: Tina [name] MRN: [id number] Date of Birth: October 16, 1960 Referring Provider:  Holley Bouche, NP  Encounter Date: 01/19/2015      PT End of Session - 01/19/15 1448    Visit Number 9   Date for PT Re-Evaluation 02/23/15   PT Start Time 1445   PT Stop Time 1440   PT Time Calculation (min) 1435 min   Activity Tolerance Patient tolerated treatment well   Behavior During Therapy [facility] for tasks assessed/performed      Past Medical History  Diagnosis Date  . Leukocytopenia   . Hypertension   . Leukopenia 06/16/2013  . Needs flu shot 06/17/2013    resolved - flu shot recvd in 04/2014  . Cancer of right breast 09/03/13    Invasive Ductal Carcinoma/Ductal Carcinoma Insitu  biopsies  . Anxiety   . GERD (gastroesophageal reflux disease)   . Degenerative joint disease     back neck and shoulder  . 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 S/P radiation therapy 04/27/2014-06/07/2014    1) Right breast, 50 Gy in 25 fractions/ 2) Right breast boost, 10 Gy in 5 fractions    Past Surgical History  Procedure Laterality Date  . Cesarean section      x 3  . Carpal tunnel release  1998    rt  . Trigger finger release  2004    rt thumb  . Portacath placement  08/2013    still currently active   . Tubal ligation    . Trigger finger release left thumb  06/28/14    at orthopedic surgical center  . Wisdom tooth extraction    . Breast surgery  03/01/2014    hx right breast cancer- 3 lymph nodes removed and lumpectomy  . Robotic assisted bilateral salpingo oopherectomy Bilateral 07/09/2014    Procedure: ROBOTIC ASSISTED BILATERAL SALPINGO OOPHORECTOM; :LYSIS OF ADHESIONS;  Surgeon: Marvene Staff, MD;  Location: [facility];  Service: Gynecology;  Laterality:  Bilateral;    There were no vitals filed for this visit.  Visit Diagnosis:  Muscle pain  Right hip pain      Subjective Assessment - 01/19/15 1449    Subjective I still feel like I have more flexibility in my right hip. I see the orthopedist next week. Patient can now sit on bilateral buttocks compared to before she could not.    Pertinent History Breast cancer with radiation and chemotherapy. Bulging disc in lumbar.   How long can you sit comfortably? None   How long can you stand comfortably? 20 min.    How long can you walk comfortably? 20 min.    Patient Stated Goals reduce dryness and vaginal discomfort   Currently in Pain? Yes   Pain Score 5    Pain Location Hip   Pain Orientation Medial   Pain Descriptors / Indicators Penetrating   Pain Type Chronic pain   Pain Onset More than a month ago   Pain Frequency Intermittent   Aggravating Factors  intercourse   Pain Relieving Factors no intercourse   Multiple Pain Sites No            [facility] PT Assessment - 01/19/15 0001    Assessment   Medical Diagnosis N81.89 Pelvic floor weakness   Strength   Right Hip  ABduction 4-/5                     [facility] Adult PT Treatment/Exercise - 01/19/15 0001    Lumbar Exercises: Aerobic   Elliptical level 1 5 min   Lumbar Exercises: Seated   Other Seated Lumbar Exercises sidely clam shell with red band 20x ; right hip abduction in sidely 10 x with tactile cues; right hip extension in prone 10x   Manual Therapy   Manual Therapy Soft tissue mobilization;Myofascial release   Soft tissue mobilization right gluteal, iliotibial band, obturator internist, SI joint in left sidely                  PT Short Term Goals - 12/20/14 1244    PT SHORT TERM GOAL #1   Title understand how to perform self soft tissue work to perineum   Time 4   Period Weeks   Status Achieved   PT SHORT TERM GOAL #3   Title right hip pain decreased >/= 25% due to increased mobility   Time 4    Period Weeks   Status Achieved   PT SHORT TERM GOAL #4   Title pain with intercourse decreased >/= 25%   Time 4   Period Weeks   Status Achieved           PT Long Term Goals - 01/19/15 1449    PT LONG TERM GOAL #2   Title pain with walking decresed >/= 75% due to increased right hip mobility and decreased pelvic floor muscle spasms   Time 12   Period Weeks   Status On-going  40% improvement   PT LONG TERM GOAL #3   Title vaginal dryness decreased >/= 50% due to using moisturizers and lubricants   Time 12   Period Weeks   Status Achieved               Plan - 01/19/15 1521    Clinical Impression Statement Patient will walk with limp on occasion.  Patient right hip abduction 4-/5.  Patient does not have pain with intercourse in the pelvic area unless she does not use lubricant.  Therapist gave patient a vulvar cream  to moisturize the vulvar area due to cracking in the area.  Patient continues with right  medical hip pain. Patient has difficulty with not flexing her right hip with abduction. Patient has not met goals yet due to right hip  pain  with walking. Patient is now able to use resistance with hip exercises. Patient continues with trigger points in the right pelvic  hip musculature.    Pt will benefit from skilled therapeutic intervention in order to improve on the following deficits Decreased range of motion;Difficulty walking;Impaired flexibility;Decreased endurance;Decreased activity tolerance;Increased fascial restricitons;Pain;Increased muscle spasms;Decreased mobility;Decreased strength   Rehab Potential Good   PT Frequency 2x / week   PT Duration 12 weeks   PT Treatment/Interventions Moist Heat;Therapeutic activities;Patient/family education;Passive range of motion;Therapeutic exercise;Biofeedback;Manual techniques;Neuromuscular re-education;Cryotherapy;Electrical Stimulation;Functional mobility training   PT Next Visit Plan see what orthopedist says   PT Home  Exercise Plan current HEP        Problem List Patient Active Problem List   Diagnosis Date Noted  . Hot flashes 10/26/2014  . GAD (generalized anxiety disorder) 10/26/2014  . Peripheral neuropathy due to chemotherapy 03/12/2014  . Breast cancer of lower-outer quadrant of right female breast 09/15/2013  . Left shoulder pain - seeing Dr. Lorin Mercy 08/20/2013  . Hypertension  Riona Lahti,PT 01/19/2015, 3:31 PM  [facility] [address] [place], [place], [address] Phone: [phone]   Fax:  [phone]

## 2015-01-23 NOTE — Assessment & Plan Note (Signed)
Right breast T2N0 IIA invasive ductal carcinoma, grade 3, ER 7% PR 6%, Ki-67 70%, HER-2/neu positive. Status post neoadjuvant [place] for just started 10/02/2013 4 cycles stopped due to neuropathy and fluid retention, gemcitabine carboplatin Herceptin Perjeta 2 with Zoladex to 28 days status post lumpectomy 03/01/2014 residual 0.6 cm tumor grade 3. Completed radiation therapy November 2015  Current therapy: Anastrozole 1 mg daily (Herceptin maintenance completed 10/20/2014) Anastrozole toxicities: 1. Hot flashes: Improved with Effexor 2. Mild fatigue but patient is able to exercise for 45 minutes on cardio 3. Constipation related to calcium supplementation: I recommended Colace and encouraged her to drink more water. 4. Musculoskeletal pain in the right wrist 5. Tingling of the scalp  Breast cancer surveillance: 1. Mammogram 09/01/2014 is normal 2. Breast exam 09/28/2014 is normal  Survivorship: Patient has changed her diet and eat more fruits and vegetables and is exercising and staying active. I encouraged her to take turmeric in her diet or as a supplement.   Leukopenia/neutropenia: ANC 1000 I discussed with her that we can watch and monitor this for now. If in 3 months her blood counts do not improve, we can set her up for a bone marrow biopsy.

## 2015-01-24 ENCOUNTER — Other Ambulatory Visit (HOSPITAL_BASED_OUTPATIENT_CLINIC_OR_DEPARTMENT_OTHER): Payer: BLUE CROSS/BLUE SHIELD

## 2015-01-24 ENCOUNTER — Telehealth: Payer: Self-pay | Admitting: Hematology and Oncology

## 2015-01-24 ENCOUNTER — Encounter: Payer: Self-pay | Admitting: Hematology and Oncology

## 2015-01-24 ENCOUNTER — Ambulatory Visit (HOSPITAL_BASED_OUTPATIENT_CLINIC_OR_DEPARTMENT_OTHER): Payer: BLUE CROSS/BLUE SHIELD | Admitting: Hematology and Oncology

## 2015-01-24 VITALS — BP 124/77 | HR 100 | Temp 98.5°F | Resp 18 | Ht 64.0 in | Wt 170.3 lb

## 2015-01-24 DIAGNOSIS — C50511 Malignant neoplasm of lower-outer quadrant of right female breast: Secondary | ICD-10-CM

## 2015-01-24 DIAGNOSIS — Z853 Personal history of malignant neoplasm of breast: Secondary | ICD-10-CM | POA: Diagnosis not present

## 2015-01-24 DIAGNOSIS — Z79811 Long term (current) use of aromatase inhibitors: Secondary | ICD-10-CM | POA: Diagnosis not present

## 2015-01-24 LAB — COMPREHENSIVE METABOLIC PANEL (CC13)
ALK PHOS: 65 U/L (ref 40–150)
ALT: 17 U/L (ref 0–55)
AST: 22 U/L (ref 5–34)
Albumin: 3.6 g/dL (ref 3.5–5.0)
Anion Gap: 8 mEq/L (ref 3–11)
BUN: 12 mg/dL (ref 7.0–26.0)
CALCIUM: 8.8 mg/dL (ref 8.4–10.4)
CO2: 26 mEq/L (ref 22–29)
Chloride: 107 mEq/L (ref 98–109)
Creatinine: 0.9 mg/dL (ref 0.6–1.1)
EGFR: 81 mL/min/{1.73_m2} — ABNORMAL LOW (ref >90)
Glucose: 109 mg/dl (ref 70–140)
POTASSIUM: 3.8 meq/L (ref 3.5–5.1)
Sodium: 141 mEq/L (ref 136–145)
Total Bilirubin: 0.43 mg/dL (ref 0.20–1.20)
Total Protein: 6.9 g/dL (ref 6.4–8.3)

## 2015-01-24 LAB — CBC WITH DIFFERENTIAL/PLATELET
BASO%: 0.8 % (ref 0.0–2.0)
Basophils Absolute: 0 10*3/uL (ref 0.0–0.1)
EOS%: 1.2 % (ref 0.0–7.0)
Eosinophils Absolute: 0 10*3/uL (ref 0.0–0.5)
HEMATOCRIT: 35.9 % (ref 34.8–46.6)
HGB: 12.2 g/dL (ref 11.6–15.9)
LYMPH%: 47.5 % (ref 14.0–49.7)
MCH: 31.1 pg (ref 25.1–34.0)
MCHC: 33.9 g/dL (ref 31.5–36.0)
MCV: 91.9 fL (ref 79.5–101.0)
MONO#: 0.2 10*3/uL (ref 0.1–0.9)
MONO%: 6.4 % (ref 0.0–14.0)
NEUT#: 1.4 10*3/uL — ABNORMAL LOW (ref 1.5–6.5)
NEUT%: 44.1 % (ref 38.4–76.8)
PLATELETS: 238 10*3/uL (ref 145–400)
RBC: 3.91 10*6/uL (ref 3.70–5.45)
RDW: 12.8 % (ref 11.2–14.5)
WBC: 3.1 10*3/uL — AB (ref 3.9–10.3)
lymph#: 1.5 10*3/uL (ref 0.9–3.3)

## 2015-01-24 MED ORDER — EXEMESTANE 25 MG PO TABS: 25.0000 mg | ORAL_TABLET | Freq: Every day | ORAL | Status: DC

## 2015-01-24 NOTE — Progress Notes (Signed)
Patient Care Team: [name], DO as PCP - General (Family Medicine) [name], MD as Consulting Physician (Emergency Medicine) [name], MD as Consulting Physician (Hematology and Oncology) [name], MD as Consulting Physician (Obstetrics and Gynecology)  DIAGNOSIS: Breast cancer of lower-outer quadrant of right female breast   Staging form: Breast, AJCC 7th Edition     Clinical: Stage Unknown (T2, NX, cM0) - Signed by [name], MD on 09/20/2013     Pathologic stage from 03/01/2014: Stage IA (yT1b, N0, cM0) - Unsigned   SUMMARY OF ONCOLOGIC HISTORY:   Breast cancer of lower-outer quadrant of right female breast   09/03/2013 Initial Biopsy Right breast needle core biopsy at 8 o'clock position: IDC with DCIS, grade 3.  ER+ (weak 7%), PR+ (weak 6%), HER2 positive (ratio 2.22, copy #3.55). Ki67 is 70%.    09/14/2013 Breast MRI lower outer right breast 2.2 x 1.3 x 1.5 cm Irregular mass. In the upper outer left breast there was a 7 x 5 x 8 mm indeterminate enhancing mass. Also another 9 x 7 x 6 mm indeterminant enhancing mass at 9:00 in the left breast was noted   09/15/2013 Clinical Stage T2Nx; Stage IIA, right breast invasive ductal carcinoma, grade 3, ER positive, HER-2/neu positive    09/16/2013 Procedure Left breast needle core biopsy at 2 o'clock position: Benign.  Right axillary LN biopsy: Benign    09/29/2013 Echocardiogram Pre-treatment EF: 65-70%   10/02/2013 - 01/18/2014 Neo-Adjuvant Chemotherapy Docetaxel, Carboplatin, Trastuzumab, Pertuzumab X 4 cycles; Taxotere stopped for fluid retention replaced with Gemcitabine 800 mg/m2 and completed 2 more cycles of chemo;  (TCH-P x 4 cycles), (GCH-P x 2 cycles); Zolodex Q 4 weeks   11/16/2013 Procedure Genetic testing negative.  Genes tested include BRCA1, BRCA2.    03/01/2014 Definitive Surgery Right lumpectomy with SLNB [name]) revealed high grade invasive carcinosarcoma [facility] w/malignant heterologous differentiation) spanning  0.6cm. Negative margins. 3 right axillary SLN (-). HER2 repeated & negative (ratio 0.71, copy #1.75).   03/01/2014 Pathologic Stage ypT1b, pN0, pMX ; Stage IA   04/27/2014 - 06/07/2014 Radiation Therapy Adjuvant radiation therapy completed [name]). Right breast: total dose 50 Gy over 25 fractions.  Right breast boost: total dose 10 Gy over 5 fractions.    07/06/2014 Echocardiogram Most recent EF: 60-65%. Being followed in [place] clinic.    07/09/2014 Surgery Bilat salpingo-oopherectomy and lysis of adhesions; surgical path benign. Uterus and cervix remain intact.    07/28/2014 - 11/16/2014 Anti-estrogen oral therapy Anastrozole 1 mg daily started.  Planned duration of treatment: 5 years.  Anastrazole stopped 10/2014 d/t pain in hands and feet. Switched to Tamoxifen.     - 10/20/2014 Chemotherapy 1 year maintenance Herceptin completed    11/16/2014 - 01/24/2015 Anti-estrogen oral therapy Tamoxifen 82m daily stopped due to persistent myalgias arthralgias and hot flashes   11/18/2014 Survivorship Survivorship Care Plan given and reviewed with pt in-person.    01/24/2015 -  Anti-estrogen oral therapy Exemestane 25 mg once daily    CHIEF COMPLIANT: Follow-up on tamoxifen  INTERVAL HISTORY: Tina [name] a 54year old with above-mentioned history of right breast cancer currently on oral antiestrogen therapy. She could not tolerate tamoxifen and would like to change to different medication. She complains of severe hot flashes and severe muscle aches and pains. She feels that her arms and legs cramp up as well as stiffen up. She is currently in [employer] [facility] [employer] program.  REVIEW OF SYSTEMS:   Constitutional: Denies  fevers, chills or abnormal weight loss Eyes: Denies blurriness of vision Ears, nose, mouth, throat, and face: Denies mucositis or sore throat Respiratory: Denies cough, dyspnea or wheezes Cardiovascular: Denies palpitation, chest discomfort or lower extremity  swelling Gastrointestinal:  Denies nausea, heartburn or change in bowel habits Skin: Denies abnormal skin rashes Lymphatics: Denies new lymphadenopathy or easy bruising Neurological:Denies numbness, tingling or new weaknesses Behavioral/Psych: Mood is stable, no new changes  Breast:  denies any pain or lumps or nodules in either breasts All other systems were reviewed with the patient and are negative.  I have reviewed the past medical history, past surgical history, social history and family history with the patient and they are unchanged from previous note.  ALLERGIES:  is allergic to adhesive and nickel.  MEDICATIONS:  Current Outpatient Prescriptions  Medication Sig Dispense Refill  . amLODipine (NORVASC) 5 MG tablet TAKE ONE TABLET BY MOUTH ONCE DAILY 30 tablet 2  . aspirin 81 MG tablet Take 81 mg by mouth daily.    Marland Kitchen b complex vitamins capsule Take 1 capsule by mouth daily.    Marland Kitchen BIOTIN 5000 PO Take 1 capsule by mouth daily.     . Calcium Carbonate-Vit D-Min (CALTRATE 600+D PLUS MINERALS PO) Take 1 tablet by mouth daily.    . Cholecalciferol (VITAMIN D3) 10000 UNITS capsule Take 10,000 Units by mouth daily.    . diphenhydrAMINE (SOMINEX) 25 MG tablet Take 25 mg by mouth at bedtime as needed for sleep.    Marland Kitchen exemestane (AROMASIN) 25 MG tablet Take 1 tablet (25 mg total) by mouth daily after breakfast. 90 tablet 3  . gabapentin (NEURONTIN) 100 MG capsule TAKE THREE CAPSULES BY MOUTH THREE TIMES DAILY 270 capsule 1  . ibuprofen (ADVIL,MOTRIN) 800 MG tablet Take 1 tablet (800 mg total) by mouth every 8 (eight) hours as needed for mild pain. 30 tablet 4  . lidocaine-prilocaine (EMLA) cream Apply topically as needed. (Patient taking differently: Apply topically as needed. Uses when she gets treatment) 30 g 6  . lisinopril-hydrochlorothiazide (PRINZIDE,ZESTORETIC) 20-12.5 MG per tablet TAKE TWO TABLETS BY MOUTH ONCE DAILY 120 tablet 0  . loratadine (CLARITIN) 10 MG tablet Take 10 mg by mouth  daily.    Marland Kitchen LORazepam (ATIVAN) 0.5 MG tablet Take 1 tablet (0.5 mg total) by mouth every 6 (six) hours as needed (Nausea or vomiting). 30 tablet 0  . omeprazole (PRILOSEC) 40 MG capsule Take 1 capsule (40 mg total) by mouth daily. 30 capsule 2  . SUMAtriptan (IMITREX) 25 MG tablet Take 1 tablet (25 mg total) by mouth every 2 (two) hours as needed for migraine. May repeat in 2 hours if needed. Do not take more then 2 tablets in 24 hours. 10 tablet 0  . venlafaxine XR (EFFEXOR-XR) 75 MG 24 hr capsule TAKE ONE CAPSULE BY MOUTH DAILY WITH BREAKFAST. 30 capsule 1  . VITAMIN E EX Apply topically. Vaginal suppository    . VITAMIN E PO Take 1 capsule by mouth daily.      No current facility-administered medications for this visit.   Facility-Administered Medications Ordered in Other Visits  Medication Dose Route Frequency Provider Last Rate Last Dose  . prochlorperazine (COMPAZINE) injection 10 mg  10 mg Intravenous Q6H PRN Minette Headland, NP   10 mg at 12/26/13 5035    PHYSICAL EXAMINATION: ECOG PERFORMANCE STATUS: 1 - Symptomatic but completely ambulatory  Filed Vitals:   01/24/15 1332  BP: 124/77  Pulse: 100  Temp: 98.5 F (36.9 C)  Resp: 18   Filed Weights   01/24/15 1332  Weight: 170 lb 4.8 oz (77.248 kg)    GENERAL:alert, no distress and comfortable SKIN: skin color, texture, turgor are normal, no rashes or significant lesions EYES: normal, Conjunctiva are pink and non-injected, sclera clear OROPHARYNX:no exudate, no erythema and lips, buccal mucosa, and tongue normal  NECK: supple, thyroid normal size, non-tender, without nodularity LYMPH:  no palpable lymphadenopathy in the cervical, axillary or inguinal LUNGS: clear to auscultation and percussion with normal breathing effort HEART: regular rate & rhythm and no murmurs and no lower extremity edema ABDOMEN:abdomen soft, non-tender and normal bowel sounds Musculoskeletal:no cyanosis of digits and no clubbing  NEURO: alert &  oriented x 3 with fluent speech, no focal motor/sensory deficits   LABORATORY DATA:  I have reviewed the data as listed   Chemistry      Component Value Date/Time   NA 141 01/24/2015 1316   NA 138 09/25/2013 1320   K 3.8 01/24/2015 1316   K 3.6* 09/25/2013 1320   CL 98 09/25/2013 1320   CL 104 06/16/2012 1303   CO2 26 01/24/2015 1316   CO2 27 09/25/2013 1320   BUN 12.0 01/24/2015 1316   BUN 9 09/25/2013 1320   CREATININE 0.9 01/24/2015 1316   CREATININE 0.82 09/25/2013 1320      Component Value Date/Time   CALCIUM 8.8 01/24/2015 1316   CALCIUM 10.0 09/25/2013 1320   ALKPHOS 65 01/24/2015 1316   ALKPHOS 42 10/04/2012 1830   AST 22 01/24/2015 1316   AST 32 10/04/2012 1830   ALT 17 01/24/2015 1316   ALT 27 10/04/2012 1830   BILITOT 0.43 01/24/2015 1316   BILITOT 0.7 10/04/2012 1830       Lab Results  Component Value Date   WBC 3.1* 01/24/2015   HGB 12.2 01/24/2015   HCT 35.9 01/24/2015   MCV 91.9 01/24/2015   PLT 238 01/24/2015   NEUTROABS 1.4* 01/24/2015    ASSESSMENT & PLAN:  Breast cancer of lower-outer quadrant of right female breast Right breast T2N0 IIA invasive ductal carcinoma, grade 3, ER 7% PR 6%, Ki-67 70%, HER-2/neu positive. Status post neoadjuvant [place] for just started 10/02/2013 4 cycles stopped due to neuropathy and fluid retention, gemcitabine carboplatin Herceptin Perjeta 2 with Zoladex to 28 days status post lumpectomy 03/01/2014 residual 0.6 cm tumor grade 3. Completed radiation therapy November 2015  Antiestrogen therapy:  1. Initially given Anastrozole 1 mg daily (Herceptin maintenance completed 10/20/2014) 2. Switch to tamoxifen March 2016 3. Switch to exemestane 01/24/2015 (due to muscle aches, hot flashes)  Tamoxifen and Anastrozole toxicities: 1. Hot flashes: Improved with Effexor 2. Mild fatigue but patient is able to exercise for 45 minutes on cardio 3. Constipation related to calcium supplementation: I recommended Colace and  encouraged her to drink more water. 4. Musculoskeletal pain in the right wrist 5. Tingling of the scalp  We will switch her to exemestane and see if she tolerates it any better.  Breast cancer surveillance: 1. Mammogram 09/01/2014 is normal 2. Breast exam 09/28/2014 is normal  Survivorship: Patient has changed her diet and eat more fruits and vegetables and is exercising and staying active. I encouraged her to take turmeric in her diet or as a supplement.   Leukopenia/neutropenia: ANC 1400 I discussed with her that we can watch and monitor this for now.   No orders of the defined types were placed in this encounter.   The patient has a good understanding of the overall  plan. she agrees with it. she will call with any problems that may develop before the next visit here.   Bellamia Ferch K, MD      

## 2015-01-24 NOTE — Telephone Encounter (Signed)
Gave avs & calendar for September °

## 2015-01-25 ENCOUNTER — Other Ambulatory Visit: Payer: Self-pay | Admitting: Hematology and Oncology

## 2015-01-25 NOTE — Telephone Encounter (Signed)
Chart reviewed.

## 2015-01-26 ENCOUNTER — Encounter: Payer: Self-pay | Admitting: Physical Therapy

## 2015-01-26 ENCOUNTER — Ambulatory Visit: Payer: BLUE CROSS/BLUE SHIELD | Admitting: Physical Therapy

## 2015-01-26 ENCOUNTER — Ambulatory Visit: Payer: BLUE CROSS/BLUE SHIELD | Admitting: Family Medicine

## 2015-01-26 DIAGNOSIS — M791 Myalgia, unspecified site: Secondary | ICD-10-CM

## 2015-01-26 DIAGNOSIS — M25551 Pain in right hip: Secondary | ICD-10-CM

## 2015-01-26 NOTE — Therapy (Signed)
[facility] [address], [place], [place], [address] Phone: [phone]   Fax:  [phone]  Physical Therapy Treatment  Patient Details  Name: Tina [name] MRN: [id number] Date of Birth: Feb 25, 1961 Referring Provider:  Holley Bouche, NP  Encounter Date: 01/26/2015      PT End of Session - 01/26/15 1218    Visit Number 10   Date for PT Re-Evaluation 02/23/15   PT Start Time 1145   PT Stop Time 1225   PT Time Calculation (min) 40 min   Activity Tolerance Patient tolerated treatment well   Behavior During Therapy [facility] for tasks assessed/performed      Past Medical History  Diagnosis Date  . Leukocytopenia   . Hypertension   . Leukopenia 06/16/2013  . Needs flu shot 06/17/2013    resolved - flu shot recvd in 04/2014  . Cancer of right breast 09/03/13    Invasive Ductal Carcinoma/Ductal Carcinoma Insitu  biopsies  . Anxiety   . GERD (gastroesophageal reflux disease)   . Degenerative joint disease     back neck and shoulder  . 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 S/P radiation therapy 04/27/2014-06/07/2014    1) Right breast, 50 Gy in 25 fractions/ 2) Right breast boost, 10 Gy in 5 fractions    Past Surgical History  Procedure Laterality Date  . Cesarean section      x 3  . Carpal tunnel release  1998    rt  . Trigger finger release  2004    rt thumb  . Portacath placement  08/2013    still currently active   . Tubal ligation    . Trigger finger release left thumb  06/28/14    at orthopedic surgical center  . Wisdom tooth extraction    . Breast surgery  03/01/2014    hx right breast cancer- 3 lymph nodes removed and lumpectomy  . Robotic assisted bilateral salpingo oopherectomy Bilateral 07/09/2014    Procedure: ROBOTIC ASSISTED BILATERAL SALPINGO OOPHORECTOM; :LYSIS OF ADHESIONS;  Surgeon: Marvene Staff, MD;  Location: [facility];  Service: Gynecology;  Laterality:  Bilateral;    There were no vitals filed for this visit.  Visit Diagnosis:  Muscle pain  Right hip pain      Subjective Assessment - 01/26/15 1153    Subjective Patient reports her right hip is 75% better.    Pertinent History Breast cancer with radiation and chemotherapy. Bulging disc in lumbar.   How long can you sit comfortably? None   How long can you stand comfortably? 20 min.    How long can you walk comfortably? 20 min.    Patient Stated Goals reduce dryness and vaginal discomfort   Currently in Pain? Yes   Pain Score 3    Pain Location Hip   Pain Orientation Medial   Pain Descriptors / Indicators Discomfort   Pain Type Chronic pain   Pain Onset More than a month ago   Pain Frequency Intermittent   Aggravating Factors  sit and move a certain way   Pain Relieving Factors rest   Multiple Pain Sites No            [facility] PT Assessment - 01/26/15 0001    Assessment   Medical Diagnosis N81.89 Pelvic floor weakness   Onset Date/Surgical Date 07/09/14   Precautions   Precautions Other (comment)  No ultrasound   Balance Screen   Has the patient  fallen in the past 6 months No   Has the patient had a decrease in activity level because of a fear of falling?  No   Is the patient reluctant to leave their home because of a fear of falling?  No   Prior Function   Level of Independence Independent with basic ADLs   PROM   Right/Left Hip Right  125 degrees   Right Hip External Rotation  75   Strength   Right Hip ABduction 4+/5   Right Hip   Right Hip Flexion 125                  Pelvic Floor Special Questions - 01/26/15 0001    Exam Type Vaginal   Strength strong squeeze, against strong resistance           [facility] Adult PT Treatment/Exercise - 01/26/15 0001    Lumbar Exercises: Stretches   Double Knee to Chest Stretch 2 reps;30 seconds  foam roller under sacrum   Quad Stretch 2 reps  prone with foam roller; hip adductor with foam roller    Piriformis Stretch 30 seconds;3 reps   Piriformis Stretch Limitations move knee inward to stretch different areas of  posterior right hip capsule   Lumbar Exercises: Aerobic   Elliptical level 1 5 min   Lumbar Exercises: Supine   Clam 10 reps  sidely; hip abduction sidely 10x                PT Education - 01/26/15 1217    Education provided Yes   Education Details reviewed HEP and patient is independent with HEP   Person(s) Educated Patient   Methods Explanation;Demonstration   Comprehension Verbalized understanding;Returned demonstration          PT Short Term Goals - 12/20/14 1244    PT SHORT TERM GOAL #1   Title understand how to perform self soft tissue work to perineum   Time 4   Period Weeks   Status Achieved   PT SHORT TERM GOAL #3   Title right hip pain decreased >/= 25% due to increased mobility   Time 4   Period Weeks   Status Achieved   PT SHORT TERM GOAL #4   Title pain with intercourse decreased >/= 25%   Time 4   Period Weeks   Status Achieved           PT Long Term Goals - 01/26/15 1159    PT LONG TERM GOAL #1   Title pain with intercourse decreased >/= 75% due to increased right hip mobility and decreased pelvic floor muscle spasms   Time 12   Period Weeks   Status Achieved   PT LONG TERM GOAL #2   Title pain with walking decresed >/= 75% due to increased right hip mobility and decreased pelvic floor muscle spasms   Time 12   Period Weeks   Status Achieved   PT LONG TERM GOAL #3   Title vaginal dryness decreased >/= 50% due to using moisturizers and lubricants   Time 12   Period Weeks   Status Achieved               Plan - 01/26/15 1219    Clinical Impression Statement Patient is a 54 year old female with diagonisis of pelvic floor weakness.  Pelvic floor strength is 5/5.  Patient reports pain with intercourse improved by 90%. Vaginal dryness improved by 75% . Right hip pain decreased by 75%.  Patient  has now has full right  hip PROM.  Righ thip strength for abdution is 4+/5.  Patient is independent with her HEP.  Patient understands how to use moisturizers and lubricants for the vaginal area to manage her vaginal dryness. FOTO score is 29% limitation compared to 75% limitation.    Pt will benefit from skilled therapeutic intervention in order to improve on the following deficits Decreased range of motion;Difficulty walking;Impaired flexibility;Decreased endurance;Decreased activity tolerance;Increased fascial restricitons;Pain;Increased muscle spasms;Decreased mobility;Decreased strength   Rehab Potential Good   Clinical Impairments Affecting Rehab Potential None   PT Treatment/Interventions Moist Heat;Therapeutic activities;Patient/family education;Passive range of motion;Therapeutic exercise;Biofeedback;Manual techniques;Neuromuscular re-education;Cryotherapy;Electrical Stimulation;Functional mobility training   PT Next Visit Plan discharge to [place] current HEP   Consulted and Agree with Plan of Care Patient        Problem List Patient Active Problem List   Diagnosis Date Noted  . Hot flashes 10/26/2014  . GAD (generalized anxiety disorder) 10/26/2014  . Peripheral neuropathy due to chemotherapy 03/12/2014  . Breast cancer of lower-outer quadrant of right female breast 09/15/2013  . Left shoulder pain - seeing Dr. Lorin Mercy 08/20/2013  . Hypertension     Anniah Glick,PT 01/26/2015, 12:29 PM  [facility] [address] [place], [place], [address] Phone: [phone]   Fax:  [phone]   PHYSICAL THERAPY DISCHARGE SUMMARY  Visits from Start of Care: 10  Current functional level related to goals / functional outcomes: Patient has met all of her goals. See above for updated.    Remaining deficits: Patient continues to have some right hip pain but she is going to see an orthopedic doctor.    Education /  Equipment: HEP Plan: Patient agrees to discharge.  Patient goals were met. Patient is being discharged due to meeting the stated rehab goals.  Thank you for the referral.  Earlie Counts, PT 01/26/2015 12:29 PM  ?????

## 2015-01-27 ENCOUNTER — Other Ambulatory Visit: Payer: Self-pay | Admitting: Orthopaedic Surgery

## 2015-01-27 DIAGNOSIS — M542 Cervicalgia: Secondary | ICD-10-CM

## 2015-01-31 ENCOUNTER — Other Ambulatory Visit: Payer: Self-pay | Admitting: Family Medicine

## 2015-02-06 ENCOUNTER — Ambulatory Visit
Admission: RE | Admit: 2015-02-06 | Discharge: 2015-02-06 | Disposition: A | Discharge status: Home / Self Care | Payer: BLUE CROSS/BLUE SHIELD | Source: Ambulatory Visit | Attending: Orthopaedic Surgery | Admitting: Orthopaedic Surgery

## 2015-02-06 ENCOUNTER — Ambulatory Visit
Admission: RE | Admit: 2015-02-06 | Discharge: 2015-02-06 | Disposition: A | Discharge status: Home / Self Care | Payer: BLUE CROSS/BLUE SHIELD | Source: Ambulatory Visit | Attending: Family Medicine | Admitting: Family Medicine

## 2015-02-06 DIAGNOSIS — M542 Cervicalgia: Secondary | ICD-10-CM

## 2015-02-06 DIAGNOSIS — R51 Headache: Principal | ICD-10-CM

## 2015-02-06 DIAGNOSIS — R519 Headache, unspecified: Secondary | ICD-10-CM

## 2015-02-06 DIAGNOSIS — R55 Syncope and collapse: Secondary | ICD-10-CM

## 2015-02-06 MED ORDER — GADOBENATE DIMEGLUMINE 529 MG/ML IV SOLN
15.0000 mL | Freq: Once | INTRAVENOUS | Status: AC | PRN
  Administered 2015-02-06: 15 mL via INTRAVENOUS

## 2015-02-10 ENCOUNTER — Ambulatory Visit (INDEPENDENT_AMBULATORY_CARE_PROVIDER_SITE_OTHER): Payer: BLUE CROSS/BLUE SHIELD | Admitting: Neurology

## 2015-02-10 ENCOUNTER — Encounter: Payer: Self-pay | Admitting: Neurology

## 2015-02-10 VITALS — BP 116/78 | HR 96 | Ht 64.0 in | Wt 168.0 lb

## 2015-02-10 DIAGNOSIS — C50919 Malignant neoplasm of unspecified site of unspecified female breast: Secondary | ICD-10-CM | POA: Insufficient documentation

## 2015-02-10 DIAGNOSIS — R51 Headache: Secondary | ICD-10-CM | POA: Diagnosis not present

## 2015-02-10 DIAGNOSIS — G4486 Cervicogenic headache: Secondary | ICD-10-CM

## 2015-02-10 DIAGNOSIS — M542 Cervicalgia: Secondary | ICD-10-CM | POA: Diagnosis not present

## 2015-02-10 MED ORDER — TIZANIDINE HCL 4 MG PO TABS: 4.0000 mg | ORAL_TABLET | Freq: Four times a day (QID) | ORAL | Status: DC | PRN

## 2015-02-10 MED ORDER — GABAPENTIN 100 MG PO CAPS: ORAL_CAPSULE | ORAL | Status: DC

## 2015-02-10 MED ORDER — PROMETHAZINE HCL 12.5 MG PO TABS: 12.5000 mg | ORAL_TABLET | Freq: Three times a day (TID) | ORAL | Status: DC | PRN

## 2015-02-10 NOTE — Patient Instructions (Addendum)
1. Increase gabapentin over the next week back to gabapentin 100mg  3 caps three times a day 2. Take tizanidine 4mg  (muscle relaxant) up to every 8 hours as needed, monitor for drowsiness 3. May take Phenergan 12.5mg  as needed for nausea and vomiting 4. Refer to Physical Therapy for neck pain- patient referred to [facility] she has been seen her before [phone] 5. Continue follow-up with Rheumatology 6. Follow-up in 3 months, call our office for any changes

## 2015-02-10 NOTE — Progress Notes (Signed)
NEUROLOGY CONSULTATION NOTE  Tina [name] MRN: [id number] DOB: [birth year]  Referring provider: Dr. Colin Benton Primary care provider: Dr. Colin Benton  Reason for consult:  headaches  Dear Dr Maudie Mercury:  Thank you for your kind referral of Tina [name] for consultation of the above symptoms. Although her history is well known to you, please allow me to reiterate it for the purpose of our medical record. Records and images were personally reviewed where available.  HISTORY OF PRESENT ILLNESS: This is a pleasant 54 year old right-handed woman with a history of breast cancer s/p radiation and chemotherapy, hypertension, polyarthralgia, presenting for evaluation of new onset headaches. She states she has had tension or sinus headaches in the past, but a couple of months ago started having a different kind of headache with pain on the right hemisphere radiating down the right side of her face and down her neck. She sometimes feels the gums on her right side feel sore. She describes a pressure-like pain, with occasional brief sharp, stabbing pains. There is occasional tingling in her scalp on the right side. Headache intensity can go up to a 10/10 where she would have nausea and vomiting, one time it appears she got cold and clammy and possibly briefly passed out. Headaches occur every 2 weeks or so, but would last for several days. She reports that she had a headache daily for 2 weeks until last week. She was feeling better for a week, then again has a mild headache today. This is associated with neck pain, and she reports that shoulder and neck pain have been bothering her on and off for years, but this is the worst it has been. She has seen her rheumatologist and told she may have fibromyalgia.  She denies any vision changes, no visual obscurations, conjunctival injection. She denies any diplopia, dysarthria, dysphagia, focal weakness, bowel/bladder incontinence. She has been told she has fluid in the  right ear, and has occasional high-pitched tinnitus. She has intermittent numbness in her left arm on a daily basis. She has chronic back pain and states Flexeril did not help, but she would use marijuana and this relaxes her and relieves the pain. She had been taking gabapentin 300mg  TID for a year for neuropathy secondary to chemo, and had self-reduced it a couple of months ago to 100mg  TID. She reports taking prn Ativan for nausea. She reports sleep is fine, she had a sleep study negative for sleep apnea. She denies any history of head injuries or recent infections, no family history of headaches.   I personally reviewed MRI brain with and without contrast which was normal, no abnormal enhancement seen. Her MRI C-spine showed degenerative change from C3-4 through C7-T1. At C4-5, there is a central disc herniation that contacts the ventral aspect of the cord but does not frankly compress or show any cord edema. There is mild foraminal narrowing on the left at C5-6 and bilaterally at C6-7, but no definite compression of the exiting nerve roots. C7-T1 shows a shallow broad-based disc herniation but without visible nerve compression.  Laboratory Data: Lab Results  Component Value Date   WBC 3.1* 01/24/2015   HGB 12.2 01/24/2015   HCT 35.9 01/24/2015   MCV 91.9 01/24/2015   PLT 238 01/24/2015     Chemistry      Component Value Date/Time   NA 141 01/24/2015 1316   NA 138 09/25/2013 1320   K 3.8 01/24/2015 1316   K 3.6* 09/25/2013 1320   CL 98  09/25/2013 1320   CL 104 06/16/2012 1303   CO2 26 01/24/2015 1316   CO2 27 09/25/2013 1320   BUN 12.0 01/24/2015 1316   BUN 9 09/25/2013 1320   CREATININE 0.9 01/24/2015 1316   CREATININE 0.82 09/25/2013 1320      Component Value Date/Time   CALCIUM 8.8 01/24/2015 1316   CALCIUM 10.0 09/25/2013 1320   ALKPHOS 65 01/24/2015 1316   ALKPHOS 42 10/04/2012 1830   AST 22 01/24/2015 1316   AST 32 10/04/2012 1830   ALT 17 01/24/2015 1316   ALT 27  10/04/2012 1830   BILITOT 0.43 01/24/2015 1316   BILITOT 0.7 10/04/2012 1830       PAST MEDICAL HISTORY: Past Medical History  Diagnosis Date  . Leukocytopenia   . Hypertension   . Leukopenia 06/16/2013  . Needs flu shot 06/17/2013    resolved - flu shot recvd in 04/2014  . Cancer of right breast 09/03/13    Invasive Ductal Carcinoma/Ductal Carcinoma Insitu  biopsies  . Anxiety   . GERD (gastroesophageal reflux disease)   . Degenerative joint disease     back neck and shoulder  . 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 S/P radiation therapy 04/27/2014-06/07/2014    1) Right breast, 50 Gy in 25 fractions/ 2) Right breast boost, 10 Gy in 5 fractions    PAST SURGICAL HISTORY: Past Surgical History  Procedure Laterality Date  . Cesarean section      x 3  . Carpal tunnel release  1998    rt  . Trigger finger release  2004    rt thumb  . Portacath placement  08/2013    still currently active   . Tubal ligation    . Trigger finger release left thumb  06/28/14    [facility]  . Wisdom tooth extraction    . Breast surgery  03/01/2014    hx right breast cancer- 3 lymph nodes removed and lumpectomy  . Robotic assisted bilateral salpingo oopherectomy Bilateral 07/09/2014    Procedure: ROBOTIC ASSISTED BILATERAL SALPINGO OOPHORECTOM; :LYSIS OF ADHESIONS;  Surgeon: Marvene Staff, MD;  Location: [facility];  Service: Gynecology;  Laterality: Bilateral;    MEDICATIONS: Current Outpatient Prescriptions on File Prior to Visit  Medication Sig Dispense Refill  . aspirin 81 MG tablet Take 81 mg by mouth daily.    Marland Kitchen b complex vitamins capsule Take 1 capsule by mouth daily.    Marland Kitchen BIOTIN 5000 PO Take 1 capsule by mouth daily.     . Calcium Carbonate-Vit D-Min (CALTRATE 600+D PLUS MINERALS PO) Take 1 tablet by mouth daily.    . Cholecalciferol (VITAMIN D3) 10000 UNITS capsule Take 10,000 Units by mouth daily.    . diphenhydrAMINE (SOMINEX) 25 MG  tablet Take 25 mg by mouth at bedtime as needed for sleep.    Marland Kitchen exemestane (AROMASIN) 25 MG tablet Take 1 tablet (25 mg total) by mouth daily after breakfast. 90 tablet 3  . gabapentin (NEURONTIN) 100 MG capsule TAKE THREE CAPSULES BY MOUTH THREE TIMES DAILY 270 capsule 1  . ibuprofen (ADVIL,MOTRIN) 800 MG tablet Take 1 tablet (800 mg total) by mouth every 8 (eight) hours as needed for mild pain. 30 tablet 4  . lidocaine-prilocaine (EMLA) cream Apply topically as needed. (Patient taking differently: Apply topically as needed. Uses when she gets treatment) 30 g 6  . lisinopril-hydrochlorothiazide (PRINZIDE,ZESTORETIC) 20-12.5 MG per tablet TAKE TWO TABLETS BY MOUTH ONCE DAILY  120 tablet 0  . loratadine (CLARITIN) 10 MG tablet Take 10 mg by mouth daily.    Marland Kitchen LORazepam (ATIVAN) 0.5 MG tablet Take 1 tablet (0.5 mg total) by mouth every 6 (six) hours as needed (Nausea or vomiting). 30 tablet 0  . omeprazole (PRILOSEC) 40 MG capsule Take 1 capsule (40 mg total) by mouth daily. 30 capsule 2  . omeprazole (PRILOSEC) 40 MG capsule TAKE ONE CAPSULE BY MOUTH DAILY 30 capsule 2  . SUMAtriptan (IMITREX) 25 MG tablet Take 1 tablet (25 mg total) by mouth every 2 (two) hours as needed for migraine. May repeat in 2 hours if needed. Do not take more then 2 tablets in 24 hours. 10 tablet 0  . venlafaxine XR (EFFEXOR-XR) 75 MG 24 hr capsule TAKE ONE CAPSULE BY MOUTH DAILY WITH BREAKFAST. 30 capsule 1  . VITAMIN E EX Apply topically. Vaginal suppository    . VITAMIN E PO Take 1 capsule by mouth daily.     Marland Kitchen amLODipine (NORVASC) 5 MG tablet TAKE ONE TABLET BY MOUTH ONCE DAILY (Patient not taking: Reported on 02/10/2015) 30 tablet 2   Current Facility-Administered Medications on File Prior to Visit  Medication Dose Route Frequency Provider Last Rate Last Dose  . prochlorperazine (COMPAZINE) injection 10 mg  10 mg Intravenous Q6H PRN Minette Headland, NP   10 mg at 12/26/13 4270    ALLERGIES: Allergies  Allergen  Reactions  . Adhesive [Tape] Itching    Burned and scarred skinned  . Nickel Rash    FAMILY HISTORY: Family History  Problem Relation Age of Onset  . Hypertension Mother   . Dementia Mother     small vessel disease  . Cancer Mother 30    cervical  cancer  . Cancer Father 52    prostate ca  . Diabetes Father   . Arthritis Father     SOCIAL HISTORY: History   Social History  . Marital Status: Married    Spouse Name: N/A  . Number of Children: 1  . Years of Education: N/A   Occupational History  .      homemaker   Social History Main Topics  . Smoking status: Former Smoker -- 1.00 packs/day for 15 years    Types: Cigars, Cigarettes    Quit date: 02/23/2004  . Smokeless tobacco: Never Used     Comment: remote smoking history  . Alcohol Use: 0.0 oz/week    0 Shots of liquor per week     Comment: occ   . Drug Use: Yes     Comment: Marjuana  . Sexual Activity: Yes   Other Topics Concern  . 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      Home Situation: lives with husband and 61 yo son      Spiritual Beliefs: Christian      Lifestyle: elliptical 3-4 times per week; working on diet as well over last year in 2014             REVIEW OF SYSTEMS: Constitutional: No fevers, chills, or sweats, no generalized fatigue, change in appetite Eyes: No visual changes, double vision, eye pain Ear, nose and throat: No hearing loss, ear pain, nasal congestion, sore throat Cardiovascular: No chest pain, palpitations Respiratory:  No shortness of breath at rest or with exertion, wheezes GastrointestinaI: No nausea, vomiting, diarrhea, abdominal pain, fecal incontinence Genitourinary:  No dysuria, urinary retention or frequency Musculoskeletal: + neck pain,  back pain Integumentary: No rash, pruritus, skin lesions Neurological: as above Psychiatric: No depression, insomnia, anxiety Endocrine: No palpitations, fatigue, diaphoresis, mood swings,  change in appetite, change in weight, increased thirst Hematologic/Lymphatic:  No anemia, purpura, petechiae. Allergic/Immunologic: no itchy/runny eyes, nasal congestion, recent allergic reactions, rashes  PHYSICAL EXAM: Filed Vitals:   02/10/15 0922  BP: 116/78  Pulse: 96   General: No acute distress Head:  Normocephalic/atraumatic Eyes: Fundoscopic exam shows bilateral sharp discs, no vessel changes, exudates, or hemorrhages Neck: supple, + paraspinal tenderness, full range of motion Back: No paraspinal tenderness Heart: regular rate and rhythm Lungs: Clear to auscultation bilaterally. Vascular: No carotid bruits. Skin/Extremities: No rash, no edema Neurological Exam: Mental status: alert and oriented to person, place, and time, no dysarthria or aphasia, Fund of knowledge is appropriate.  Recent and remote memory are intact.  Attention and concentration are normal.    Able to name objects and repeat phrases. Cranial nerves: CN I: not tested CN II: pupils equal, round and reactive to light, visual fields intact, fundi unremarkable. CN III, IV, VI:  full range of motion, no nystagmus, no ptosis CN V: facial sensation intact CN VII: upper and lower face symmetric CN VIII: hearing intact to finger rub CN IX, X: gag intact, uvula midline CN XI: sternocleidomastoid and trapezius muscles intact CN XII: tongue midline Bulk & Tone: normal, no fasciculations. Motor: 5/5 throughout with no pronator drift. Sensation: intact to light touch, cold, pin, vibration and joint position sense.  No extinction to double simultaneous stimulation.  Romberg test negative Deep Tendon Reflexes: +2 throughout, no ankle clonus Plantar responses: downgoing bilaterally Cerebellar: no incoordination on finger to nose, heel to shin. No dysdiadochokinesia Gait: narrow-based and steady, able to tandem walk adequately. Tremor: none  IMPRESSION: This is a pleasant 54 year old right-handed woman with a history  of f breast cancer s/p radiation and chemotherapy, hypertension, polyarthralgia, presenting for evaluation of new onset headaches for the past 2 months. Headaches are localized over the right side, with significant neck pain. She appears to have had presyncope (?if she lost consciousness) due to severe pain. Her neurological exam and MRI brain are normal. Headaches likely cervicogenic in nature. She had also self-reduced gabapentin, which was likely helping. She will increase back to 300mg  TID over the next week, side effects were discussed. She will be referred to PT for neck pain, and will try a different muscle relaxant Tizanidine. Side effects were discussed. She was also given prn Phenergan for nausea. She will follow-up in 3 months and knows to call our office for any changes.   Thank you for allowing me to participate in the care of this patient. Please do not hesitate to call for any questions or concerns.   Ellouise Newer, M.D.  CC: Dr. Maudie Mercury

## 2015-02-11 ENCOUNTER — Other Ambulatory Visit: Payer: BLUE CROSS/BLUE SHIELD

## 2015-02-14 ENCOUNTER — Telehealth: Payer: Self-pay | Admitting: Family Medicine

## 2015-02-14 NOTE — Telephone Encounter (Signed)
Pt said Dr Estanislado Pandy said she think that the pt may have  Fibromyalgia. Pt said she was told that her pcp can treat her for this. Pt called today for pain medicine.   Pharmacy ; [facility]

## 2015-02-15 ENCOUNTER — Telehealth: Payer: Self-pay | Admitting: Family Medicine

## 2015-02-15 DIAGNOSIS — R519 Headache, unspecified: Secondary | ICD-10-CM

## 2015-02-15 DIAGNOSIS — R51 Headache: Principal | ICD-10-CM

## 2015-02-15 MED ORDER — SUMATRIPTAN SUCCINATE 25 MG PO TABS: 25.0000 mg | ORAL_TABLET | ORAL | Status: DC | PRN

## 2015-02-15 NOTE — Telephone Encounter (Signed)
Pt lost rx for imitrex 25 mg and would like med call into [facility] [address]

## 2015-02-15 NOTE — Telephone Encounter (Signed)
I spoke with the pt and scheduled an appt for tomorrow at 9:15am.  She is aware the Rx for Imitrex was called in to her pharmacy.

## 2015-02-15 NOTE — Telephone Encounter (Signed)
Rx done and the pt was informed. 

## 2015-02-15 NOTE — Telephone Encounter (Signed)
Please help her schedule an appt. Please obtain records from her rheumatologist prior to appt.

## 2015-02-15 NOTE — Telephone Encounter (Signed)
Left a message for the pt to return my call.  

## 2015-02-16 ENCOUNTER — Encounter: Payer: Self-pay | Admitting: Family Medicine

## 2015-02-16 ENCOUNTER — Ambulatory Visit (INDEPENDENT_AMBULATORY_CARE_PROVIDER_SITE_OTHER): Payer: BLUE CROSS/BLUE SHIELD | Admitting: Family Medicine

## 2015-02-16 VITALS — BP 122/86 | HR 85 | Temp 98.6°F | Ht 64.0 in | Wt 169.7 lb

## 2015-02-16 DIAGNOSIS — M797 Fibromyalgia: Secondary | ICD-10-CM | POA: Diagnosis not present

## 2015-02-16 DIAGNOSIS — Z23 Encounter for immunization: Secondary | ICD-10-CM | POA: Diagnosis not present

## 2015-02-16 DIAGNOSIS — R51 Headache: Secondary | ICD-10-CM

## 2015-02-16 DIAGNOSIS — G4486 Cervicogenic headache: Secondary | ICD-10-CM

## 2015-02-16 DIAGNOSIS — M542 Cervicalgia: Secondary | ICD-10-CM | POA: Diagnosis not present

## 2015-02-16 DIAGNOSIS — F411 Generalized anxiety disorder: Secondary | ICD-10-CM

## 2015-02-16 DIAGNOSIS — I10 Essential (primary) hypertension: Secondary | ICD-10-CM | POA: Diagnosis not present

## 2015-02-16 DIAGNOSIS — M329 Systemic lupus erythematosus, unspecified: Secondary | ICD-10-CM

## 2015-02-16 MED ORDER — LISINOPRIL-HYDROCHLOROTHIAZIDE 20-12.5 MG PO TABS: 1.0000 | ORAL_TABLET | Freq: Every day | ORAL | Status: DC

## 2015-02-16 NOTE — Progress Notes (Signed)
Pre visit review using our clinic review tool, if applicable. No additional management support is needed unless otherwise documented below in the visit note. 

## 2015-02-16 NOTE — Addendum Note (Signed)
Addended by: Agnes Lawrence on: 02/16/2015 09:53 AM   Modules accepted: Orders

## 2015-02-16 NOTE — Patient Instructions (Signed)
-  continue to titrate the gabapentin  -physical therapy per neurology recommendations  -treatment with Dr. Solon Augusta per plan  -follow up in 2-3 months if worsening or not improving

## 2015-02-16 NOTE — Progress Notes (Signed)
HPI:  Cervicogenic headaches and facial pain/Migraines: -seeing neurology, rheum and ortho for this -MRI head and neck with some DDD, but not felt contributing -per notes treating with gabapentin, PT, muscle relaxer, on venlafaxine for anxiety and hot flashes -reports: doing a little better -denies:   ? Fibromyalgia and Lupus: -reports saw Dr. Estanislado Pandy yesterday and told has fibromyalgia and lupus and is starting hydroxychlor  -meds: neurontin, tizanidine, venlafaxine, ibuprofen a few times per week  HTN: -dhe stopped norvasc because she is exercising and BP has been better -she is taking lisinopril -hctz one tablet daily  ROS: See pertinent positives and negatives per HPI.  Past Medical History  Diagnosis Date  . Leukocytopenia   . Hypertension   . Leukopenia 06/16/2013  . Needs flu shot 06/17/2013    resolved - flu shot recvd in 04/2014  . Cancer of right breast 09/03/13    Invasive Ductal Carcinoma/Ductal Carcinoma Insitu  biopsies  . Anxiety   . GERD (gastroesophageal reflux disease)   . Degenerative joint disease     back neck and shoulder  . 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 S/P radiation therapy 04/27/2014-06/07/2014    1) Right breast, 50 Gy in 25 fractions/ 2) Right breast boost, 10 Gy in 5 fractions    Past Surgical History  Procedure Laterality Date  . Cesarean section      x 3  . Carpal tunnel release  1998    rt  . Trigger finger release  2004    rt thumb  . Portacath placement  08/2013    still currently active   . Tubal ligation    . Trigger finger release left thumb  06/28/14    at [facility] center  . Wisdom tooth extraction    . Breast surgery  03/01/2014    hx right breast cancer- 3 lymph nodes removed and lumpectomy  . Robotic assisted bilateral salpingo oopherectomy Bilateral 07/09/2014    Procedure: ROBOTIC ASSISTED BILATERAL SALPINGO OOPHORECTOM; :LYSIS OF ADHESIONS;  Surgeon: Marvene Staff, MD;   Location: [facility];  Service: Gynecology;  Laterality: Bilateral;    Family History  Problem Relation Age of Onset  . Hypertension Mother   . Dementia Mother     small vessel disease  . Cancer Mother 30    cervical  cancer  . Cancer Father 45    prostate ca  . Diabetes Father   . Arthritis Father     History   Social History  . Marital Status: Married    Spouse Name: N/A  . Number of Children: 1  . Years of Education: N/A   Occupational History  .      homemaker   Social History Main Topics  . Smoking status: Former Smoker -- 1.00 packs/day for 15 years    Types: Cigars, Cigarettes    Quit date: 02/23/2004  . Smokeless tobacco: Never Used     Comment: remote smoking history  . Alcohol Use: 0.0 oz/week    0 Shots of liquor per week     Comment: occ   . Drug Use: Yes     Comment: Marjuana  . Sexual Activity: Yes   Other Topics Concern  . 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  aspirin 81 MG tablet, Take 81 mg by mouth daily., Disp: , Rfl:  .  b complex vitamins capsule, Take 1 capsule by mouth daily., Disp: , Rfl:  .  BIOTIN 5000 PO, Take 1 capsule by mouth daily. , Disp: , Rfl:  .  Calcium Carbonate-Vit D-Min (CALTRATE 600+D PLUS MINERALS PO), Take 1 tablet by mouth daily., Disp: , Rfl:  .  exemestane (AROMASIN) 25 MG tablet, Take 1 tablet (25 mg total) by mouth daily after breakfast., Disp: 90 tablet, Rfl: 3 .  gabapentin (NEURONTIN) 100 MG capsule, Take 3 caps three times a day, Disp: 270 capsule, Rfl: 6 .  ibuprofen (ADVIL,MOTRIN) 800 MG tablet, Take 1 tablet (800 mg total) by mouth every 8 (eight) hours as needed for mild pain., Disp: 30 tablet, Rfl: 4 .  lisinopril-hydrochlorothiazide  (PRINZIDE,ZESTORETIC) 20-12.5 MG per tablet, Take 1 tablet by mouth daily., Disp: 90 tablet, Rfl: 3 .  loratadine (CLARITIN) 10 MG tablet, Take 10 mg by mouth daily., Disp: , Rfl:  .  omeprazole (PRILOSEC) 40 MG capsule, TAKE ONE CAPSULE BY MOUTH DAILY, Disp: 30 capsule, Rfl: 2 .  SUMAtriptan (IMITREX) 25 MG tablet, Take 1 tablet (25 mg total) by mouth every 2 (two) hours as needed for migraine. May repeat in 2 hours if needed. Do not take more then 2 tablets in 24 hours., Disp: 10 tablet, Rfl: 0 .  tiZANidine (ZANAFLEX) 4 MG tablet, Take 1 tablet (4 mg total) by mouth every 6 (six) hours as needed for muscle spasms., Disp: 30 tablet, Rfl: 6 .  venlafaxine XR (EFFEXOR-XR) 75 MG 24 hr capsule, TAKE ONE CAPSULE BY MOUTH DAILY WITH BREAKFAST., Disp: 30 capsule, Rfl: 1 .  VITAMIN E EX, Apply topically. Vaginal suppository, Disp: , Rfl:  .  VITAMIN E PO, Take 1 capsule by mouth daily. , Disp: , Rfl:  No current facility-administered medications for this visit.  Facility-Administered Medications Ordered in Other Visits:  .  prochlorperazine (COMPAZINE) injection 10 mg, 10 mg, Intravenous, Q6H PRN, Minette Headland, NP, 10 mg at 12/26/13 0917  EXAM:  Filed Vitals:   02/16/15 0914  BP: 122/86  Pulse: 85  Temp: 98.6 F (37 C)    Body mass index is 29.11 kg/(m^2).  GENERAL: vitals reviewed and listed above, alert, oriented, appears well hydrated and in no acute distress  HEENT: atraumatic, conjunttiva clear, no obvious abnormalities on inspection of external nose and ears  NECK: no obvious masses on inspection  LUNGS: clear to auscultation bilaterally, no wheezes, rales or rhonchi, good air movement  CV: HRRR, no peripheral edema  MS: moves all extremities without noticeable abnormality  PSYCH: pleasant and cooperative, no obvious depression or anxiety  ASSESSMENT AND PLAN:  Discussed the following assessment and plan:  Essential hypertension  Cervicogenic headache  GAD  (generalized anxiety disorder)  Neck pain  Fibromyalgia  Lupus  -counseled and supported, unfortunate multiple complicated diagnoses, she seems to be handling this all well emotionally -she currently is starting hydrocychlor, is titrating neurontin per recent neurology notes and doing [facility] PT and discussed would likely want to try this before adding or changing medications -advised caution with polypharmacy and would not recommend adding another medication with CNS sedating potential without simplifying current regimen -if failure with above plan, discussed considering trial change from venlafaxine  and neurontin to cymbalta or amitriptyline as are first line for fibro and may help with migraines, anxiety (cymbalta) and neuralgia as well -advised I do not rx opiod pain medications for chronic musculoskeletal disorders and advised against this -Patient advised to return or notify a doctor immediately if symptoms worsen or persist or new concerns arise.  Patient Instructions  -continue to titrate the gabapentin  -physical therapy per neurology recommendations  -treatment with Dr. Solon Augusta per plan  -follow up in 2-3 months if worsening or not improving     KIM, HANNAH R.

## 2015-03-02 ENCOUNTER — Ambulatory Visit: Payer: BLUE CROSS/BLUE SHIELD | Attending: Psychology | Admitting: Psychology

## 2015-03-02 ENCOUNTER — Encounter: Payer: Self-pay | Admitting: Physical Therapy

## 2015-03-02 ENCOUNTER — Ambulatory Visit: Payer: BLUE CROSS/BLUE SHIELD | Attending: Neurology | Admitting: Physical Therapy

## 2015-03-02 DIAGNOSIS — M25551 Pain in right hip: Secondary | ICD-10-CM | POA: Diagnosis present

## 2015-03-02 DIAGNOSIS — R519 Headache, unspecified: Secondary | ICD-10-CM

## 2015-03-02 DIAGNOSIS — I890 Lymphedema, not elsewhere classified: Secondary | ICD-10-CM | POA: Diagnosis present

## 2015-03-02 DIAGNOSIS — R413 Other amnesia: Secondary | ICD-10-CM

## 2015-03-02 DIAGNOSIS — M791 Myalgia: Secondary | ICD-10-CM | POA: Insufficient documentation

## 2015-03-02 DIAGNOSIS — M542 Cervicalgia: Secondary | ICD-10-CM | POA: Diagnosis present

## 2015-03-02 DIAGNOSIS — R51 Headache: Secondary | ICD-10-CM | POA: Diagnosis present

## 2015-03-02 NOTE — Patient Instructions (Signed)
Flexibility: Neck Stretch   Grasp left arm above wrist and pull down across body while gently tilting head same direction. Hold _15___ seconds. Relax. Repeat __2__ times per set. Do _1___ sets per session. Do _2___ sessions per day. Both sides http://orth.exer.us/346   Copyright  VHI. All rights reserved.  Levator Scapula Stretch   Place left hand on same side shoulder blade. With other hand, gently stretch head down and away. Hold __15__ seconds. Repeat _2___ times per set. Do __1__ sets per session. Do _1___ sessions per day. Both sides http://orth.exer.us/348   Copyright  VHI. All rights reserved.  Place tennis balls in a sock under the base of head and lay flat for 2 min.   [facility] [address], [place], [place] [address] Phone # [phone] Fax [phone]

## 2015-03-02 NOTE — Therapy (Signed)
[facility] [address] [place], [place], [address] Phone: [phone]   Fax:  [phone]  Physical Therapy Evaluation  Patient Details  Name: Tina [name] MRN: [id number] Date of Birth: 02-Feb-1961 Referring Provider:  [name], MD  Encounter Date: 03/02/2015      PT End of Session - 03/02/15 1137    Visit Number 1   Date for PT Re-Evaluation 04/13/15   PT Start Time 1100   PT Stop Time 1137   PT Time Calculation (min) 37 min   Activity Tolerance Patient tolerated treatment well   Behavior During Therapy [facility] for tasks assessed/performed      Past Medical History  Diagnosis Date  . Leukocytopenia   . Hypertension   . Leukopenia 06/16/2013  . Needs flu shot 06/17/2013    resolved - flu shot recvd in 04/2014  . Cancer of right breast 09/03/13    Invasive Ductal Carcinoma/Ductal Carcinoma Insitu  biopsies  . Anxiety   . GERD (gastroesophageal reflux disease)   . Degenerative joint disease     back neck and shoulder  . 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 S/P radiation therapy 04/27/2014-06/07/2014    1) Right breast, 50 Gy in 25 fractions/ 2) Right breast boost, 10 Gy in 5 fractions    Past Surgical History  Procedure Laterality Date  . Cesarean section      x 3  . Carpal tunnel release  1998    rt  . Trigger finger release  2004    rt thumb  . Portacath placement  08/2013    still currently active   . Tubal ligation    . Trigger finger release left thumb  06/28/14    [facility]  . Wisdom tooth extraction    . Breast surgery  03/01/2014    hx right breast cancer- 3 lymph nodes removed and lumpectomy  . Robotic assisted bilateral salpingo oopherectomy Bilateral 07/09/2014    Procedure: ROBOTIC ASSISTED BILATERAL SALPINGO OOPHORECTOM; :LYSIS OF ADHESIONS;  Surgeon: Marvene Staff, MD;  Location: [facility];  Service: Gynecology;  Laterality:  Bilateral;    There were no vitals filed for this visit.  Visit Diagnosis:  Neck pain - Plan: PT plan of care cert/re-cert  Frequent headaches - Plan: PT plan of care cert/re-cert      Subjective Assessment - 03/02/15 1112    Subjective Patient reports headaches and neck pain began 2 months ago with sudden onset.  Patient reports new results show Lupus and Fibromyalgia. Left arm goes to sleep when she is sleeping and on the elliptical and working out.    Pertinent History Breast cancer with radiation and chemotherapy. Bulging disc in lumbar.   How long can you sit comfortably? None   How long can you stand comfortably? None   How long can you walk comfortably? None   Diagnostic tests carpal tunnel in left wrist   Patient Stated Goals turn head without pain, drive without being nervous of getting into an accident   Currently in Pain? Yes   Pain Score 8    Pain Location Neck  head   Pain Orientation Right;Left;Posterior;Mid   Pain Descriptors / Indicators Dull   Pain Type Chronic pain   Pain Radiating Towards radiates to left shoulder blade   Pain Onset More than a month ago   Pain Frequency Constant  center of thoracic, intermittent cervical and headache  Aggravating Factors  turning head, sleeping, looking up, looking down at phone   Pain Relieving Factors look straight ahead   Effect of Pain on Daily Activities difficulty with sleeping, looking down at phone, turning head; has headache 6 times per week   Multiple Pain Sites No            [facility] PT Assessment - 03/02/15 0001    Assessment   Medical Diagnosis R51 cervicaogenic headach; M54.2 Neck pain; C50.919 breast cancer, unspecified lateralty   Precautions   Precautions Other (comment)  No ultrasound   Precaution Comments No ultrasound   Balance Screen   Has the patient fallen in the past 6 months No   Has the patient had a decrease in activity level because of a fear of falling?  No   Is the patient reluctant to  leave their home because of a fear of falling?  No   Home Ecologist residence   Prior Function   Level of Independence Independent with basic ADLs   Cognition   Overall Cognitive Status Within Functional Limits for tasks assessed   Observation/Other Assessments   Focus on Therapeutic Outcomes (FOTO)  61% limitation  goal is 45% limitation   Posture/Postural Control   Posture/Postural Control No significant limitations   ROM / Strength   AROM / PROM / Strength AROM;Strength   AROM   Overall AROM Comments bil. shoulder ROM is full   AROM Assessment Site Cervical;Thoracic   Cervical Flexion full   Cervical Extension full   Cervical - Right Side Bend full   Cervical - Left Side Bend decreased by 25%   Cervical - Right Rotation decreased by 25%   Cervical - Left Rotation decreased by 25%   Thoracic - Left Rotation decreased by 25%   Strength   Strength Assessment Site Shoulder   Right/Left Shoulder Left   Left Shoulder Flexion 4/5   Left Shoulder ABduction 3+/5   Left Shoulder Internal Rotation 4/5   Left Shoulder External Rotation 4/5   Left Elbow Flexion 4/5   Left Wrist Extension 4/5   Palpation   Palpation comment Palpable tenderness on right cervical paraspinals and suboccipitals, decreased sideglide movement of C4-7 going to the left.                            PT Education - 03/02/15 1137    Education provided Yes   Education Details cervical stretches   Person(s) Educated Patient   Methods Explanation;Demonstration;Tactile cues;Verbal cues;Handout   Comprehension Returned demonstration;Verbalized understanding          PT Short Term Goals - 03/02/15 1135    PT SHORT TERM GOAL #1   Title independent with initial HEP   Time 3   Period Weeks   Status New   PT SHORT TERM GOAL #2   Title sleep with pain decreased >/= 25%   Time 3   Period Weeks   Status New   PT SHORT TERM GOAL #3   Title turn head while  driving with >/= 16% greater ease   Time 3   Period Weeks   Status New   PT SHORT TERM GOAL #4   Title look down at phone with pain decreased >/= 25%   Time 3   Period Weeks   Status New           PT Long Term Goals - 03/02/15 1132  PT LONG TERM GOAL #1   Title independent with HEP and understand how to progress   Time 6   Period Weeks   Status New   PT LONG TERM GOAL #2   Title full cervical rotation and thoracic rotation so she can turn head with driving without difficulty   Time 6   Period Weeks   Status New   PT LONG TERM GOAL #3   Title look down at phone with >/= 75% decrease in pain   Time 6   Period Weeks   Status New   PT LONG TERM GOAL #4   Title sleep with pain decreased >/= 75%   Time 6   Period Weeks   Status New   PT LONG TERM GOAL #5   Title headache less than 1-2 times per week   Time 6   Period Weeks   Status New               Plan - 03/02/15 1138    Clinical Impression Statement Patient is a 54 year old female with neck and cervical pain.  Patient reports sudden onset of pain 2 months ago.  Patient reports her pain is 8/10  in right side of head and cervical to left shoulder blade. Patient reports constant pain in upper thoracic but intermittent cervical and headach.  Cervical ROM deficits is left sidebending and rotation decreased by 25%.  Left thoracic rotation decreased by 25%. Left shoulder abduction 3+/5, flexion 4/5, ER 4/5, elbow felxion 4/5, and wrist extension 4/5. FOTO score is 45% limitation.  Patient reports difficulty with turning her head with driving, looking down at the phone and sleeping.  Headaches are 5 times per week. Patient would benefit from physical therapy to improve cervical ROM, decrease pain and headaches.    Pt will benefit from skilled therapeutic intervention in order to improve on the following deficits Decreased activity tolerance;Impaired flexibility;Hypomobility;Decreased strength;Decreased range of  motion;Decreased endurance;Decreased mobility;Increased muscle spasms;Pain   Rehab Potential Excellent   Clinical Impairments Affecting Rehab Potential None   PT Frequency 2x / week   PT Duration 6 weeks   PT Treatment/Interventions Cryotherapy;Electrical Stimulation;Traction;Moist Heat;Therapeutic activities;Therapeutic exercise;Neuromuscular re-education;Manual techniques;Patient/family education;Passive range of motion   PT Next Visit Plan soft tissue work, joint mobilization, cervical ROM   PT Home Exercise Plan cervical ROM   Recommended Other Services None   Consulted and Agree with Plan of Care Patient         Problem List Patient Active Problem List   Diagnosis Date Noted  . Cervicogenic headache 02/10/2015  . Neck pain 02/10/2015  . Breast cancer 02/10/2015  . Hot flashes 10/26/2014  . GAD (generalized anxiety disorder) 10/26/2014  . Peripheral neuropathy due to chemotherapy 03/12/2014  . Breast cancer of lower-outer quadrant of right female breast 09/15/2013  . Left shoulder pain - seeing Dr. Lorin Mercy 08/20/2013  . Hypertension     Siler Mavis ,PT  03/02/2015, 11:45 AM  [facility] [address], [place], [place], [address] Phone: [phone]   Fax:  [phone]

## 2015-03-03 NOTE — Progress Notes (Signed)
[facility]  [address]   Telephone [phone] [address] Fax [phone] [place], [place] [address]  Initial Contact Note  Name:  Tina [name] Date of Birth; [birth year] MRN:  [id number] Date:  03/03/2015  [name] is an 54 y.o. female who was referred for neuropsychological evaluation by Mike Craze, NP due to her report of problems with memory subsequent to undergoing chemotherapy for breast cancer in 2015.   A total of 3 hours was spent today reviewing medical records, interviewing (CPT [phone]) [name] and administering and scoring neurocognitive tests (CPT [phone] & [phone]).  Preliminary Diagnostic Impression: Memory Loss [R41.3]  There were no concerns expressed or behaviors displayed by [name] that would require immediate attention.   A full report will follow once the planned testing has been completed. His/her next appointment is scheduled for 03/10/15   Jamey Ripa, Ph.D Licensed Psychologist 03/03/2015

## 2015-03-09 ENCOUNTER — Ambulatory Visit: Payer: BLUE CROSS/BLUE SHIELD

## 2015-03-09 DIAGNOSIS — M542 Cervicalgia: Secondary | ICD-10-CM | POA: Diagnosis not present

## 2015-03-09 DIAGNOSIS — R51 Headache: Secondary | ICD-10-CM

## 2015-03-09 DIAGNOSIS — R519 Headache, unspecified: Secondary | ICD-10-CM

## 2015-03-09 NOTE — Therapy (Signed)
[facility] [address] [place], [place], [address] Phone: [phone]   Fax:  [phone]  Physical Therapy Treatment  Patient Details  Name: Tina [name] MRN: [id number] Date of Birth: Mar 11, 1961 Referring Provider:  [name], MD  Encounter Date: 03/09/2015      PT End of Session - 03/09/15 1144    Visit Number 2   Date for PT Re-Evaluation 04/13/15   PT Start Time 1108   PT Stop Time 1154   PT Time Calculation (min) 46 min   Activity Tolerance Patient tolerated treatment well   Behavior During Therapy [facility] for tasks assessed/performed      Past Medical History  Diagnosis Date  . Leukocytopenia   . Hypertension   . Leukopenia 06/16/2013  . Needs flu shot 06/17/2013    resolved - flu shot recvd in 04/2014  . Cancer of right breast 09/03/13    Invasive Ductal Carcinoma/Ductal Carcinoma Insitu  biopsies  . Anxiety   . GERD (gastroesophageal reflux disease)   . Degenerative joint disease     back neck and shoulder  . 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 S/P radiation therapy 04/27/2014-06/07/2014    1) Right breast, 50 Gy in 25 fractions/ 2) Right breast boost, 10 Gy in 5 fractions    Past Surgical History  Procedure Laterality Date  . Cesarean section      x 3  . Carpal tunnel release  1998    rt  . Trigger finger release  2004    rt thumb  . Portacath placement  08/2013    still currently active   . Tubal ligation    . Trigger finger release left thumb  06/28/14    at orthopedic surgical center  . Wisdom tooth extraction    . Breast surgery  03/01/2014    hx right breast cancer- 3 lymph nodes removed and lumpectomy  . Robotic assisted bilateral salpingo oopherectomy Bilateral 07/09/2014    Procedure: ROBOTIC ASSISTED BILATERAL SALPINGO OOPHORECTOM; :LYSIS OF ADHESIONS;  Surgeon: Marvene Staff, MD;  Location: [facility];  Service: Gynecology;  Laterality:  Bilateral;    There were no vitals filed for this visit.  Visit Diagnosis:  Neck pain  Frequent headaches      Subjective Assessment - 03/09/15 1107    Subjective Stretches are helping.     Currently in Pain? Yes   Pain Score 5    Pain Location Neck  head   Pain Orientation Right;Left   Pain Descriptors / Indicators Aching;Dull   Pain Type Chronic pain   Pain Onset More than a month ago   Pain Frequency Constant   Aggravating Factors  turning head, sleeping, looking up, use of phone   Pain Relieving Factors heat                         [facility] Adult PT Treatment/Exercise - 03/09/15 0001    Exercises   Exercises Neck   Neck Exercises: Seated   Other Seated Exercise Levator and UT stretch 3x20 seconds   Modalities   Modalities Electrical Stimulation;Moist Heat   Moist Heat Therapy   Number Minutes Moist Heat 15 Minutes   Moist Heat Location Cervical   Electrical Stimulation   Electrical Stimulation Location bil neck and UT   Electrical Stimulation Action IFC   Electrical Stimulation Parameters 15 minutes   Electrical Stimulation Goals Pain  Manual Therapy   Manual Therapy Joint mobilization;Soft tissue mobilization;Myofascial release   Joint Mobilization PA glides C4-6 grade II   Myofascial Release soft tissue elongation with trigger point release and elongation to bilateral cervical paraspinals, UT and suboccipitals                  PT Short Term Goals - 03/09/15 1114    PT SHORT TERM GOAL #1   Title independent with initial HEP   Time 3   Period Weeks   Status On-going  able to demo first exercises correctly   PT SHORT TERM GOAL #2   Title sleep with pain decreased >/= 25%   Time 3   Period Weeks   Status On-going  only 1 visit since evalution           PT Long Term Goals - 03/02/15 1132    PT LONG TERM GOAL #1   Title independent with HEP and understand how to progress   Time 6   Period Weeks   Status New   PT LONG  TERM GOAL #2   Title full cervical rotation and thoracic rotation so she can turn head with driving without difficulty   Time 6   Period Weeks   Status New   PT LONG TERM GOAL #3   Title look down at phone with >/= 75% decrease in pain   Time 6   Period Weeks   Status New   PT LONG TERM GOAL #4   Title sleep with pain decreased >/= 75%   Time 6   Period Weeks   Status New   PT LONG TERM GOAL #5   Title headache less than 1-2 times per week   Time 6   Period Weeks   Status New               Plan - 03/09/15 1142    Clinical Impression Statement Pt wth only 1 session after evaluation.  Pt with continued trigger points and tension in neck and UT musculature.  Pt without significant change in status secondary to only 1 session.  Pt will benfit from continued PT for flexibiliy, manual, modalities and UE/postural strength.     Pt will benefit from skilled therapeutic intervention in order to improve on the following deficits Decreased activity tolerance;Impaired flexibility;Hypomobility;Decreased strength;Decreased range of motion;Decreased endurance;Decreased mobility;Increased muscle spasms;Pain   Rehab Potential Excellent   PT Frequency 2x / week   PT Duration 6 weeks   PT Treatment/Interventions Cryotherapy;Electrical Stimulation;Traction;Moist Heat;Therapeutic activities;Therapeutic exercise;Neuromuscular re-education;Manual techniques;Patient/family education;Passive range of motion   PT Next Visit Plan soft tissue work, joint mobilization, cervical ROM, modalities PRN   Consulted and Agree with Plan of Care Patient        Problem List Patient Active Problem List   Diagnosis Date Noted  . Cervicogenic headache 02/10/2015  . Neck pain 02/10/2015  . Breast cancer 02/10/2015  . Hot flashes 10/26/2014  . GAD (generalized anxiety disorder) 10/26/2014  . Peripheral neuropathy due to chemotherapy 03/12/2014  . Breast cancer of lower-outer quadrant of right female breast  09/15/2013  . Left shoulder pain - seeing Dr. Lorin Mercy 08/20/2013  . Hypertension     Xinyi Batton, PT 03/09/2015, 11:45 AM  [facility] [address] [place], [place], [address] Phone: [phone]   Fax:  [phone]

## 2015-03-10 ENCOUNTER — Ambulatory Visit (INDEPENDENT_AMBULATORY_CARE_PROVIDER_SITE_OTHER): Payer: BLUE CROSS/BLUE SHIELD | Admitting: Psychology

## 2015-03-10 DIAGNOSIS — R413 Other amnesia: Secondary | ICD-10-CM

## 2015-03-11 ENCOUNTER — Encounter: Payer: Self-pay | Admitting: Physical Therapy

## 2015-03-11 ENCOUNTER — Ambulatory Visit: Payer: BLUE CROSS/BLUE SHIELD | Admitting: Physical Therapy

## 2015-03-11 DIAGNOSIS — M542 Cervicalgia: Secondary | ICD-10-CM

## 2015-03-11 DIAGNOSIS — R519 Headache, unspecified: Secondary | ICD-10-CM

## 2015-03-11 DIAGNOSIS — R51 Headache: Secondary | ICD-10-CM

## 2015-03-11 NOTE — Progress Notes (Addendum)
[facility]  [address]   Telephone [phone] [address] Fax [phone] [place], [place] [address]   [place]* This report should not be released without the consent of the client  Name:   Tina [name] Date of Birth:  2061/04/27 [facility] MR#:  [id number] Dates of Evaluation: 03/02/15 & 03/10/15  Reason for Referral Tina [name] is a 54 year-old woman who was referred for neuropsychological evaluation by Tina [name], nurse practitioner and Tina Lose, MD with the [facility], as prompted by Tina [name]'s complaints of cognitive difficulties subsequent to undergoing multiple cycles of chemotherapy to treat right breast cancer between March 2015 and June 2015. She then underwent a course of radiation treatment beginning in September 2015.   Sources of Information Electronic medical records from the [facility] were reviewed. Tina [name] was interviewed.    History of Illness & Chief Complaints Tina [name] reported that she noticed onset of cognitive difficulties following her second or third chemotherapy treatment in the spring of 2015. She did not report any worsening of her cognitive functioning over time and possibly some improvement. She has not received feedback from others about any noticeable cognitive difficulties.   A brain MRI scan on 02/06/15 showed "mild chronic appearing small vessel ischemic change of the cerebral hemispheric white matter" without acute findings.  Currently, she reports that on a daily basis her thinking is slower than usual, her attention wanders (though only while watching television), she has trouble forming thoughts, she forgets where she put items or why she entered a room, and has trouble finding words while speaking.   She reported that her subjective cognitive problems have had a minimal impact on her daily functioning or quality of life. In contrast  she reported persisting fatigue, muscle cramps and pain since her cancer treatment have limited her ability to perform household chores.  With regards to physical or somatic complaints, she reported an approximate two month history of right-sided headache, persisting fatigue and muscle cramps that began after her cancer treatment, presumed neuropathic pain in her feet and hands, hot flashes that began soon after she underwent a bilateral salpingo-oopherectomy in December 2015, and chronic back, shoulder and neck pain since 2010. She did not cite any problems with gait, balance, limb strength,  hearing, vision, sleep (she reported that she sleeps on average ten hours per night), daytime alertness, speech, appetite or swallowing.   She described herself as usually in good spirits though reported that she has been experiencing a few instances per week of feeling "stressed out" and "overwhelmed" by her everyday responsibilities, even relatively simple activities, such as preparing dinner.  She also has continued to assist her mother who has a memory disorder. She stated "There is so much for me to do and I can't do it". She reported increased sensitivity to noise with associated irritability. She denied experiencing persisting sad mood, apathy, mood instability, loss of interests or pleasure, undue anxiety, social withdrawal, suicidal thoughts, vegetative disturbances, hallucinations or delusions.   Background She lives with husband of four years. She was previously divorced. She has three adult sons from her first marriage and a stepson. Her 61 year old son and 51 year old stepson live with her and her husband.  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 She had worked there for seventeen years.    She reported  that she was graduated from high school.  She stated that she always had difficulty with  comprehending what she had read, even despite re-reading. She reported that she was in regular classes, did not receive special education services and was not retained in grade.  In addition to breast cancer, her past medical history was notable for degenerative joint disease, gastroesophageal reflux disease, hypertension and leukopenia. Medical records indicated that she may have been diagnosed with fibromyalgia and lupus by a rheumatologist in July 2016 and started on hydroxychlor. A sleep study on 11/13/14, ordered due to possible apneic breathing, was within normal limits.  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   She reported no history of head injury, loss of consciousness, stroke-like symptoms, seizures or toxic exposure. She reported using marijuana on a regular basis for back pain relief but denied other illegal drug use, prescription medication abuse or alcohol abuse. She was a smoker from 46 until 2005.  Her mother was reported as having a memory disorder or dementia due to vascular disease. There was no report of a family history of psychiatric disorder.  She reported no history of mental health contacts. She described herself as having been mostly free of serious emotional difficulties though she did experience an approximate six month period of depression following the loss of her job in 2010. She reported no history of mood instability, suicidal behavior, being a victim of abuse or psychotic symptoms.   Observations She appeared as an appropriately dressed and groomed woman in no apparent distress. She interacted in a consistently pleasant and cooperative manner. She spoke in a normal tone of voice, maintained good eye  contact and responded to all questions. She seemed to be in good spirits. Her affect appeared within a wide range and without lability. Her thought processes were coherent and organized without loose associations, verbal perseverations or flight of ideas. She was deemed to be a reliable informant. Her thought content was devoid of unusual or bizarre ideas.  Evaluation Procedures Beck Depression Wheeler Verbal Learning Test-II Controlled Oral Word Association Test Multidimensional Health Profile-Health Functioning  Multidimensional Health Profile-Psychosocial Functioning  Personality Assessment Screener  Rey 15-Item Memory Test Rey Complex Figure: Copy and recall trials Stroop Color-Word Test Trail Making A & B Wechsler Adult Intelligence Scale-IV:  Coding, Digit Span, Matrix Reasoning, Symbol Search & Vocabulary Wechsler Memory Scale-IV: Symbol Span subtest Wide Range Achievement Test-4: Word Reading Wisconsin Card Sorting Test Assessment Results  Validity & Interpretative Considerations It was concluded that the test results represented a valid measure of her cognitive functioning. She described herself as feeling awake and mentally alert at the outset of testing. She did not exhibit signs of either physical or emotional discomfort. She did complain of mild headache as well as neck and shoulder pain, the latter rated as a five on a subjective ten-point scale. She did not report or display problems with vision (she wore her eyeglasses), hearing or motor skills. She appeared consistently attentive and persisted well to task. There were no signs of spatial inattention or problems with visual recognition. She was able to comprehend task instructions without difficulty. She appeared to expend maximal effort. Her test-taking effort was confirmed on a validity test (Rey 15-Item Memory Test) that required her to immediately draw fifteen symbols from memory. Although it is  presented as  a difficult task, the redundancy amongst the items reduces the amount of information to be remembered thereby making this a relatively easy task. She reproduced all fifteen symbols.  Her test scores were corrected to reflect norms for her age and, whenever possible, her gender, race (i.e., African-American) and educational level (i.e., 12 years). A listing of her test scores can be found at the end of this report.   Intellectual level A meaningful estimate of her global intellectual functioning could not be derived due to the discrepancy between measures of her verbal and nonverbal aptitude. Her fund of acquired verbal knowledge was lower than expected as her measures of word knowledge (Wechsler Adult Intelligence Scale-IV (WAIS-IV) Vocabulary) and word reading skill (Wide Range Achievement Test-4: Word Reading) fell at the 16th and 7th percentiles, respectively. Word reading in particular tends to be highly resistant to the effects of acquired brain dysfunction and therefore offers a reliable estimate of pre-morbid functioning. In contrast, her performance on a test of nonverbal reasoning (WAIS-IV Matrix Reasoning) that is highly correlated with nonverbal intelligence fell at the midpoint of the Average range.   Speed of Processing & Attention Her performances on timed tests that required a verbal response or visual search/sequencing were variable. Her speed to discriminate similarities or differences amongst sets of geometric symbols (WAIS-IV Symbol Search) fell within the Low Average range. Her speed to transcribe symbols to match digits using a key (WAIS-IV Coding) was within the Average range. Her speed to draw lines connecting numbers randomly arrayed on a page in numerical sequence (Trials A) fell within the Mildly Impaired range. Her speed to read words out loud was within the Mildly Impaired range while her speed to name color hues fell within the Low Average range (Stroop Test).  Her  attentional capacity to hold and manipulate information within temporary (working) memory was within normal limits as she scored within the Average range on tests that required mentally rearranging of digit sequences in reverse or in ascending numerical order (WAIS-IV Digit Span) or immediate recognition of symbols in left to right order (Wechsler Memory Scale-IV (WMS-IV) Symbol Span).   Learning & Memory Learning and memory abilities were well within normal expectations and were a relative strength. Her immediate recall of a sixteen word list ([place]) was within the High Average range. Her aggregate verbal learning to five repetitions of the list was within the Superior range. Her delayed verbal recall was also strong as she recalled 15 of 16 words (savings= 94%) after both a short delay of three minutes and a long delay of approximately twenty minutes, Likewise, her ability to reproduce a Emergency planning/management officer (Rey Complex Figure) after short and long delays was above average as she demonstrated savings scores of 80% and 76% compared to her initial copy, respectively.     Executive Functions No problems were evident on tests of executive function. Executive functions comprise the attentional and organizational processes that enable a person to successfully initiate, monitor, shift, plan and execute complex behavior. Her speed to shift set by maintaining an ascending and alternating sequence of numbers and letters (Trails B) fell within the Average range. Moreover, she did not deviate from the correct sequence. Her efficiency in naming the print color of a word while simultaneously ignoring the conflicting word (Stroop Color and Word Test) was below average, which likely reflected generalized slowing rather than a problem with selective attention or response inhibition, as this score was not discrepant from her speed to simply read  words or name color hues. Her performance on a test of  verbal fluency that required word generation under time pressure (Controlled Oral Word Association Test) was within the Average range. Her performance on a complex test of nonverbal problem-solving that required inferring logical ways to sort geometric designs on the basis of ongoing feedback ([employer]) was within normal expectations as she quickly inferred the initial sorting principle, deduced all possible sorting ideas, maintained set and adaptively shifted to a new strategy given feedback that the previously correct strategy had become incorrect.   Emotional Functioning The Personality Assessment Screener (PAS) is a self-report questionnaire that screens for the likelihood of emotional or behavioral problems. Her total PAS score of 16 was within the mildly elevated range, which suggested that her potential for emotional and/or behavioral problems is greater than typical compared to community-based adults. Her total score was somewhat inflated, however, by a very high score on a scale associated with health-related difficulties. Of note, she did not endorse any symptoms or problems on scales assessing suicidal thinking, suspiciousness, anger dyscontrol or problems with alcohol use.    She did not endorse any symptoms on the Beck Depression Inventory-FastScreen.  The Multidimensional Health Profile-Psychosocial Functioning is a questionnaire that assesses perceived life stress, coping skills, social resources and psychological distress. Her overall level of global psychological distress fell within the average range although subscales representing problems with energy level and cognitive functioning were mildly elevated. In contrast, she scored within the normal or typical range on scales reflecting somatic complaints or affective distress. She reported having experienced a typical number of stressful events within the past year that she rated in aggregate as having been moderately  stressful. She endorsed having been satisfied with the amount of emotional, informational and tangible support she has received from family and close friends. She reported having had mostly positive interactions with others. She rated herself as feeling satisfied with her life overall.    The Multidimensional Health Profile-Psychosocial Functioning is a 69-item questionnaire that assesses  response to illness, health habits, health care utilization and health beliefs and attitudes. She scored within the normal or typical range on all of these factors.    Summary Neuropsychological testing with Ms. Shiree Altemus identified two areas of relative weakness amongst an otherwise normal neurocognitive profile. First, her speed of processing was a relative weakness as her scores on such measures varied from the Average to the Borderline range. Secondly, her performances on measures of acquired verbal knowledge fell within the Low Average to Borderline ranges, which was significantly lower than her other measured neurocognitive abilities. Again, most of her neuropsychological abilities were well-preserved. Her performances on measures of attentional capacity, learning, memory and executive function were within normal expectations. Of note, her memory functioning was a relative strength within the High Average to Superior ranges.    With regards to her emotional status, she reported experiencing instances of feeling "stressed out" and "overwhelmed" by her everyday  responsibilities but otherwise did not report experiencing persisting emotional distress, difficulties with psychosocial adjustment or quality of life issues. No atypical reactions or problems were identified related to her response to her illness, health habits, health care utilization or health beliefs and attitudes.  Conclusions/Discussion The etiology of her cognitive complaints is likely multifactorial as she presents with several medical factors,  namely fatigue, menopausal symptoms, possible side-effects of chemotherapy and joint pain/fibromyalgia, that could have a negative impact on her speed of processing. Despite her belief that her cognitive  difficulties began after undergoing chemotherapy, it is not possible to unequivocally attribute any neurocognitive weaknesses either in part or full to the effects of chemotherapy. There were no indications that motivational or emotional variables have been impacting her cognitive functioning. Finally, the finding of her lower than expected fund of acquired verbal knowledge would not indicate an acquired cognitive condition but rather would suggest a developmentally-based learning disorder, which Ms. Zollinger has long suspected.   From a functional perspective, her variable speed of processing might render the processing of complex information more time-consuming for her and therefore drain her mental energies more quickly as compared to others at her level of cognitive ability. In addition, she may be less efficient in acquiring new Information that is delivered at a pace that exceeds her speed of processing.  The results and conclusions from this evaluation were discussed with her on 03/16/15. She was reassured that her learning, memory and higher cognitive abilities are well-preserved. She was reminded that she continues to deal with the same degree of life demands, namely taking care of her family as well as her mother, as she did prior to her cancer treatment. Delegating some of her responsibilities, if possible, might result in a subjective improvement in her cognitive functioning. She was encouraged to monitor her fatigue level as fatigue is likely a primary determinant of her cognitive functioning. Given that she reported feeling more energetic and mentally sharper after exercising in the evenings, she was encouraged to add a morning exercise routine. Finally, use of medications that have a sedating  profile should be minimized when possible as sedation typically slows processing speed.    I have appreciated the opportunity to evaluate Ms. Alroy Dust.  Please feel free to contact me with any comments or questions.    ______________________ Jamey Ripa, Ph.D Licensed Psychologist          Copies to: Tina Craze, MSN Ms. [name] (per her request)       ADDENDUM-NEUROPSYCHOLOGICAL TEST RESULTS   [place] Verbal Learning Test-II  Raw score Percentile (adjusted for age)  Trial 1 Free recall 8 84th   Trials 1 - 5 Free recall  41 95th   List B Free Recall 10 98th   Short-Delay Free Recall 15 94th   Short-Delay Cued Recall  15 84th   Long-Delay Free Recall 15 94th   Long-Delay Cued Recall  15 84th   Total intrusions  0 normal  Total repetitions  1 normal  Recognition Hits 16 normal                 Controlled Oral Word Association Test Score=  29 words/2 repetitions 38th (adjusted for age, gender, race and educational level)     Rey Complex Figure: copy       Copy=                       35/36  Above average    3 minute recall=    28/36 Above average (savings=80%)  30 minute recall=  26.5/36 Above Average (savings= 76%)    Rey 15-Item Memory Test Score= 15 /15 Normal    Stroop Test  Score residual % (adjusted for age and educational level)  Word 79 -20   8th   Color 65   -9 24th   Color-Word 25 -12 11th     Trails A Score= 53s  0e   8th (adjusted for age, gender, race and educational level)  Trails B Score=  59s  0e 66th (adjusted for age, gender, race and educational level)    Wechsler Adult Intelligence Scale-IV  Subtest Scaled Score Percentile  Digit Span  Forward               Backward               Sequencing 10 11  9  9  50th       63rd    37th  37th   Matrix Reasoning 10 50th   Vocabulary   7 16th   Symbol Search   7 16th   Coding     8 25th      Wechsler Memory Scale-IV Symbol Span subtest Scaled score= 12 75th       [employer] Test  Total errors= 26   45th (adjusted for age and education)  Perseverative errors=  9   63rd    Categories=  6 >16th   Trials to first category= 11 >16th   Failure to maintain set=   0 >16th   Learning to learn= -1.22 11th - 16th      Wide Range Achievement Test-4 Subtest  Raw score Standard score Percentile  Word Reading 45/70 78 7th

## 2015-03-11 NOTE — Therapy (Signed)
[facility] [address] [place], [place], [address] Phone: [phone]   Fax:  [phone]  Physical Therapy Treatment  Patient Details  Name: Tina [name] MRN: [id number] Date of Birth: 1960/12/10 Referring Provider:  [name], DO  Encounter Date: 03/11/2015      PT End of Session - 03/11/15 1108    Visit Number 3   Date for PT Re-Evaluation 04/13/15   PT Start Time 1105   PT Stop Time 1154   PT Time Calculation (min) 49 min   Activity Tolerance Patient tolerated treatment well   Behavior During Therapy [facility] for tasks assessed/performed      Past Medical History  Diagnosis Date  . Leukocytopenia   . Hypertension   . Leukopenia 06/16/2013  . Needs flu shot 06/17/2013    resolved - flu shot recvd in 04/2014  . Cancer of right breast 09/03/13    Invasive Ductal Carcinoma/Ductal Carcinoma Insitu  biopsies  . Anxiety   . GERD (gastroesophageal reflux disease)   . Degenerative joint disease     back neck and shoulder  . 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 S/P radiation therapy 04/27/2014-06/07/2014    1) Right breast, 50 Gy in 25 fractions/ 2) Right breast boost, 10 Gy in 5 fractions    Past Surgical History  Procedure Laterality Date  . Cesarean section      x 3  . Carpal tunnel release  1998    rt  . Trigger finger release  2004    rt thumb  . Portacath placement  08/2013    still currently active   . Tubal ligation    . Trigger finger release left thumb  06/28/14    [facility]  . Wisdom tooth extraction    . Breast surgery  03/01/2014    hx right breast cancer- 3 lymph nodes removed and lumpectomy  . Robotic assisted bilateral salpingo oopherectomy Bilateral 07/09/2014    Procedure: ROBOTIC ASSISTED BILATERAL SALPINGO OOPHORECTOM; :LYSIS OF ADHESIONS;  Surgeon: Marvene Staff, MD;  Location: [facility];  Service: Gynecology;  Laterality:  Bilateral;    There were no vitals filed for this visit.  Visit Diagnosis:  Neck pain  Frequent headaches      Subjective Assessment - 03/11/15 1108    Subjective Today is not a good day. I felt good after last visit.   Pertinent History Breast cancer with radiation and chemotherapy. Bulging disc in lumbar.   How long can you sit comfortably? None   How long can you stand comfortably? None   How long can you walk comfortably? None   Diagnostic tests carpal tunnel in left wrist   Patient Stated Goals turn head without pain, drive without being nervous of getting into an accident   Currently in Pain? Yes   Pain Score 10-Worst pain ever   Pain Location Neck   Pain Orientation Right;Left   Pain Descriptors / Indicators Aching;Dull   Pain Type Chronic pain   Aggravating Factors  turning head, sleeping, looking up, use of phone   Pain Relieving Factors heat   Effect of Pain on Daily Activities difficulty with sleeping, looking down at phone, turning head,    Multiple Pain Sites No                         [facility] Adult PT Treatment/Exercise - 03/11/15 0001  Modalities   Modalities Electrical Stimulation;Moist Heat   Moist Heat Therapy   Number Minutes Moist Heat 15 Minutes   Moist Heat Location Cervical   Electrical Stimulation   Electrical Stimulation Location bil neck and UT   Electrical Stimulation Action IFC   Electrical Stimulation Parameters to patients tolerance   Electrical Stimulation Goals Pain   Manual Therapy   Manual Therapy Soft tissue mobilization   Myofascial Release bil. SCM, right scalenes, around right mandible                  PT Short Term Goals - 03/09/15 1114    PT SHORT TERM GOAL #1   Title independent with initial HEP   Time 3   Period Weeks   Status On-going  able to demo first exercises correctly   PT SHORT TERM GOAL #2   Title sleep with pain decreased >/= 25%   Time 3   Period Weeks   Status On-going  only 1  visit since evalution           PT Long Term Goals - 03/02/15 1132    PT LONG TERM GOAL #1   Title independent with HEP and understand how to progress   Time 6   Period Weeks   Status New   PT LONG TERM GOAL #2   Title full cervical rotation and thoracic rotation so she can turn head with driving without difficulty   Time 6   Period Weeks   Status New   PT LONG TERM GOAL #3   Title look down at phone with >/= 75% decrease in pain   Time 6   Period Weeks   Status New   PT LONG TERM GOAL #4   Title sleep with pain decreased >/= 75%   Time 6   Period Weeks   Status New   PT LONG TERM GOAL #5   Title headache less than 1-2 times per week   Time 6   Period Weeks   Status New               Plan - 03/11/15 1138    Clinical Impression Statement Patient is a 54 year old female with diagnosis of cervical and neck pain. Today patients pain was 10/10 therefore we did not exercise and focused on trigger points in the cervical area. Patient had thickness in right scalenes and suboccipitals.  Patient would benefit from physical therapy to decrease tigger points and increase tissue mobility.    Pt will benefit from skilled therapeutic intervention in order to improve on the following deficits Decreased activity tolerance;Impaired flexibility;Hypomobility;Decreased strength;Decreased range of motion;Decreased endurance;Decreased mobility;Increased muscle spasms;Pain   Rehab Potential Excellent   Clinical Impairments Affecting Rehab Potential None   PT Frequency 2x / week   PT Duration 6 weeks   PT Treatment/Interventions Cryotherapy;Electrical Stimulation;Traction;Moist Heat;Therapeutic activities;Therapeutic exercise;Neuromuscular re-education;Manual techniques;Patient/family education;Passive range of motion   PT Next Visit Plan soft tissue work, joint mobilization, cervical ROM, modalities PRN   PT Home Exercise Plan progress as needed   Consulted and Agree with Plan of Care  Patient        Problem List Patient Active Problem List   Diagnosis Date Noted  . Cervicogenic headache 02/10/2015  . Neck pain 02/10/2015  . Breast cancer 02/10/2015  . Hot flashes 10/26/2014  . GAD (generalized anxiety disorder) 10/26/2014  . Peripheral neuropathy due to chemotherapy 03/12/2014  . Breast cancer of lower-outer quadrant of right female breast 09/15/2013  .  Left shoulder pain - seeing Dr. Lorin Mercy 08/20/2013  . Hypertension     Leontyne Manville,PT 03/11/2015, 11:42 AM  [facility] [address], [place], [place], [address] Phone: [phone]   Fax:  [phone]

## 2015-03-12 ENCOUNTER — Other Ambulatory Visit: Payer: Self-pay | Admitting: Family Medicine

## 2015-03-16 ENCOUNTER — Ambulatory Visit: Payer: BLUE CROSS/BLUE SHIELD | Admitting: Physical Therapy

## 2015-03-16 ENCOUNTER — Ambulatory Visit (INDEPENDENT_AMBULATORY_CARE_PROVIDER_SITE_OTHER): Payer: BLUE CROSS/BLUE SHIELD | Admitting: Psychology

## 2015-03-16 ENCOUNTER — Encounter: Payer: Self-pay | Admitting: Physical Therapy

## 2015-03-16 DIAGNOSIS — R413 Other amnesia: Secondary | ICD-10-CM

## 2015-03-16 DIAGNOSIS — M542 Cervicalgia: Secondary | ICD-10-CM | POA: Diagnosis not present

## 2015-03-16 DIAGNOSIS — R51 Headache: Secondary | ICD-10-CM

## 2015-03-16 DIAGNOSIS — R519 Headache, unspecified: Secondary | ICD-10-CM

## 2015-03-16 NOTE — Progress Notes (Signed)
[facility]  [address]   Telephone [phone] [address] Fax [phone] [place],  [address]  Follow-Up Contact Note  Name:  Tina [name] Date of Birth: Jan 30, 1961 MRN:  [id number] Date:  03/16/2015  Tina [name] is an 54 y.o. female who was referred for neuropsychological evaluation by Mike Craze MSN due to her complaints of cognitive difficulty=ties post- chemotherapy.   The results from her recent neuropsychological evaluation were discussed with her today.  There were no concerns expressed or behaviors displayed by Tina [name] that would require immediate attention.   A full report will follow.   Jamey Ripa, Ph.D Licensed Psychologist 03/16/2015

## 2015-03-16 NOTE — Therapy (Signed)
[facility] [address] [place], [place], [address] Phone: [phone]   Fax:  [phone]  Physical Therapy Treatment  Patient Details  Name: Tina [name] MRN: [id number] Date of Birth: 1961/07/09 Referring Provider:  [name], MD  Encounter Date: 03/16/2015      PT End of Session - 03/16/15 1153    Visit Number 4   Date for PT Re-Evaluation 04/13/15   PT Start Time 1149   PT Stop Time 1250   PT Time Calculation (min) 61 min   Activity Tolerance Patient tolerated treatment well   Behavior During Therapy [facility] for tasks assessed/performed      Past Medical History  Diagnosis Date  . Leukocytopenia   . Hypertension   . Leukopenia 06/16/2013  . Needs flu shot 06/17/2013    resolved - flu shot recvd in 04/2014  . Cancer of right breast 09/03/13    Invasive Ductal Carcinoma/Ductal Carcinoma Insitu  biopsies  . Anxiety   . GERD (gastroesophageal reflux disease)   . Degenerative joint disease     back neck and shoulder  . 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 S/P radiation therapy 04/27/2014-06/07/2014    1) Right breast, 50 Gy in 25 fractions/ 2) Right breast boost, 10 Gy in 5 fractions    Past Surgical History  Procedure Laterality Date  . Cesarean section      x 3  . Carpal tunnel release  1998    rt  . Trigger finger release  2004    rt thumb  . Portacath placement  08/2013    still currently active   . Tubal ligation    . Trigger finger release left thumb  06/28/14    [facility]  . Wisdom tooth extraction    . Breast surgery  03/01/2014    hx right breast cancer- 3 lymph nodes removed and lumpectomy  . Robotic assisted bilateral salpingo oopherectomy Bilateral 07/09/2014    Procedure: ROBOTIC ASSISTED BILATERAL SALPINGO OOPHORECTOM; :LYSIS OF ADHESIONS;  Surgeon: Marvene Staff, MD;  Location: [facility];  Service: Gynecology;  Laterality:  Bilateral;    There were no vitals filed for this visit.  Visit Diagnosis:  Neck pain  Frequent headaches      Subjective Assessment - 03/16/15 1155    Subjective I feel better.    Pertinent History Breast cancer with radiation and chemotherapy. Bulging disc in lumbar.   How long can you sit comfortably? None   How long can you stand comfortably? None   How long can you walk comfortably? None   Diagnostic tests carpal tunnel in left wrist   Patient Stated Goals turn head without pain, drive without being nervous of getting into an accident   Currently in Pain? Yes   Pain Score 3    Pain Location Neck   Pain Orientation Right;Left   Pain Descriptors / Indicators Dull;Tightness   Pain Radiating Towards radiates to left shoulder blade   Pain Onset More than a month ago   Pain Frequency Intermittent   Aggravating Factors  turning head,    Pain Relieving Factors heat   Multiple Pain Sites No            [facility] PT Assessment - 03/16/15 0001    AROM   Cervical - Left Side Bend decreased by 25%   Cervical - Right Rotation decreased by 25%   Cervical - Left Rotation decreased  by 25%   Thoracic - Left Rotation decreased by 25%                     [facility] Adult PT Treatment/Exercise - 03/16/15 0001    Neck Exercises: Supine   Neck Retraction 10 reps;5 secs  into red physioball   Capital Flexion 10 reps  nodding head on red physioball   Cervical Rotation 10 reps;Left;Right  assistance for end range   Shoulder Flexion 15 reps;Right;Left  press head into red physioball   Other Supine Exercise press head into red physioball wiht horizontal abduction 15x   Modalities   Modalities Electrical Stimulation;Moist Heat   Moist Heat Therapy   Number Minutes Moist Heat 20 Minutes   Moist Heat Location Cervical   Electrical Stimulation   Electrical Stimulation Location bil neck and UT   Electrical Stimulation Action IFC   Electrical Stimulation Parameters to patient  tolerance   Electrical Stimulation Goals Pain   Manual Therapy   Manual Therapy Soft tissue mobilization   Myofascial Release bil. SCM, right scalenes, around right mandible; suboccipitals                PT Education - 03/16/15 1219    Education provided No          PT Short Term Goals - 03/16/15 1157    PT SHORT TERM GOAL #1   Title independent with initial HEP   Time 3   Period Weeks   Status Achieved   PT SHORT TERM GOAL #2   Title sleep with pain decreased >/= 25%   Period Weeks   Status On-going   1 night 25% better   PT SHORT TERM GOAL #3   Title turn head while driving with >/= 17% greater ease   Time 3   Period Weeks   Status On-going  no change   PT SHORT TERM GOAL #4   Title look down at phone with pain decreased >/= 25%   Time 3   Period Weeks   Status Achieved  25% better           PT Long Term Goals - 03/02/15 1132    PT LONG TERM GOAL #1   Title independent with HEP and understand how to progress   Time 6   Period Weeks   Status New   PT LONG TERM GOAL #2   Title full cervical rotation and thoracic rotation so she can turn head with driving without difficulty   Time 6   Period Weeks   Status New   PT LONG TERM GOAL #3   Title look down at phone with >/= 75% decrease in pain   Time 6   Period Weeks   Status New   PT LONG TERM GOAL #4   Title sleep with pain decreased >/= 75%   Time 6   Period Weeks   Status New   PT LONG TERM GOAL #5   Title headache less than 1-2 times per week   Time 6   Period Weeks   Status New               Plan - 03/16/15 1221    Clinical Impression Statement Patient has a decrease in headaches to day.  Patient was able to sleep through the night 1 time last night.  Patient has not change in pain with turning her head. Patient has less trigger points in cervical musculature. Patient has no pain with looking  upward.  Patient has 25% decreased in pain with looking downward at her phone. Patient  would benefit from physical therapy to decrease pain and improve ROM.     Pt will benefit from skilled therapeutic intervention in order to improve on the following deficits Decreased activity tolerance;Impaired flexibility;Hypomobility;Decreased strength;Decreased range of motion;Decreased endurance;Decreased mobility;Increased muscle spasms;Pain   Rehab Potential Excellent   Clinical Impairments Affecting Rehab Potential None   PT Frequency 2x / week   PT Duration 6 weeks   PT Treatment/Interventions Cryotherapy;Electrical Stimulation;Traction;Moist Heat;Therapeutic activities;Therapeutic exercise;Neuromuscular re-education;Manual techniques;Patient/family education;Passive range of motion   PT Next Visit Plan shoulder abduction and flexion strengthening ,cervical strengthening with physioball, soft tissue work   PT Home Exercise Plan shoulder flexion and abduction strengthening   Consulted and Agree with Plan of Care Patient        Problem List Patient Active Problem List   Diagnosis Date Noted  . Cervicogenic headache 02/10/2015  . Neck pain 02/10/2015  . Breast cancer 02/10/2015  . Hot flashes 10/26/2014  . GAD (generalized anxiety disorder) 10/26/2014  . Peripheral neuropathy due to chemotherapy 03/12/2014  . Breast cancer of lower-outer quadrant of right female breast 09/15/2013  . Left shoulder pain - seeing Dr. Lorin Mercy 08/20/2013  . Hypertension     GRAY,CHERYL,PT 03/16/2015, 12:26 PM  [facility] [address] [place], [place], [address] Phone: [phone]   Fax:  [phone]

## 2015-03-17 ENCOUNTER — Encounter: Payer: Self-pay | Admitting: Physical Therapy

## 2015-03-17 ENCOUNTER — Ambulatory Visit: Payer: BLUE CROSS/BLUE SHIELD | Admitting: Physical Therapy

## 2015-03-17 DIAGNOSIS — M25551 Pain in right hip: Secondary | ICD-10-CM

## 2015-03-17 DIAGNOSIS — R51 Headache: Secondary | ICD-10-CM

## 2015-03-17 DIAGNOSIS — M542 Cervicalgia: Secondary | ICD-10-CM | POA: Diagnosis not present

## 2015-03-17 DIAGNOSIS — I890 Lymphedema, not elsewhere classified: Secondary | ICD-10-CM

## 2015-03-17 DIAGNOSIS — R519 Headache, unspecified: Secondary | ICD-10-CM

## 2015-03-17 DIAGNOSIS — R413 Other amnesia: Secondary | ICD-10-CM

## 2015-03-17 DIAGNOSIS — M791 Myalgia, unspecified site: Secondary | ICD-10-CM

## 2015-03-17 NOTE — Therapy (Signed)
[facility] [address] [place], [place], [address] Phone: [phone]   Fax:  [phone]  Physical Therapy Treatment  Patient Details  Name: Tina [name] MRN: [id number] Date of Birth: 01/14/1961 Referring Provider:  [name], DO  Encounter Date: 03/17/2015      PT End of Session - 03/17/15 1107    Visit Number 5   Date for PT Re-Evaluation 04/13/15   PT Start Time 1022   PT Stop Time 1117   PT Time Calculation (min) 55 min   Activity Tolerance Patient tolerated treatment well   Behavior During Therapy [facility] for tasks assessed/performed      Past Medical History  Diagnosis Date  . Leukocytopenia   . Hypertension   . Leukopenia 06/16/2013  . Needs flu shot 06/17/2013    resolved - flu shot recvd in 04/2014  . Cancer of right breast 09/03/13    Invasive Ductal Carcinoma/Ductal Carcinoma Insitu  biopsies  . Anxiety   . GERD (gastroesophageal reflux disease)   . Degenerative joint disease     back neck and shoulder  . 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 S/P radiation therapy 04/27/2014-06/07/2014    1) Right breast, 50 Gy in 25 fractions/ 2) Right breast boost, 10 Gy in 5 fractions    Past Surgical History  Procedure Laterality Date  . Cesarean section      x 3  . Carpal tunnel release  1998    rt  . Trigger finger release  2004    rt thumb  . Portacath placement  08/2013    still currently active   . Tubal ligation    . Trigger finger release left thumb  06/28/14    at [facility]opedic surgical center  . Wisdom tooth extraction    . Breast surgery  03/01/2014    hx right breast cancer- 3 lymph nodes removed and lumpectomy  . Robotic assisted bilateral salpingo oopherectomy Bilateral 07/09/2014    Procedure: ROBOTIC ASSISTED BILATERAL SALPINGO OOPHORECTOM; :LYSIS OF ADHESIONS;  Surgeon: Marvene Staff, MD;  Location: [facility];  Service: Gynecology;  Laterality:  Bilateral;    There were no vitals filed for this visit.  Visit Diagnosis:  Memory loss  Neck pain  Frequent headaches  Muscle pain  Right hip pain  Lymphedema of breast      Subjective Assessment - 03/17/15 1058    Subjective No major changes reported   Pertinent History Breast cancer with radiation and chemotherapy, bulging disc in lumbar, per pt report diagnosed with lupus   Currently in Pain? Yes   Pain Score 3    Pain Location Neck   Pain Orientation Right;Left   Pain Descriptors / Indicators Tightness;Dull   Pain Type Chronic pain   Pain Onset More than a month ago   Pain Frequency Intermittent   Multiple Pain Sites No                         [facility] Adult PT Treatment/Exercise - 03/17/15 0001    Exercises   Exercises Neck   Neck Exercises: Supine   Neck Retraction 10 reps;5 secs  into red physioball   Capital Flexion 10 reps  on red physioball   Cervical Rotation 10 reps;Left;Right  on red physioball   Shoulder Flexion 20 reps  while cervical retraction on red physioball    Other Supine Exercise press head into red physioball  with horizontal abduction 15x  using yellow t-band 2 x10   Modalities   Modalities Electrical Stimulation;Moist Heat   Moist Heat Therapy   Number Minutes Moist Heat 15 Minutes   Moist Heat Location Cervical   Electrical Stimulation   Electrical Stimulation Location bil neck and UT   Electrical Stimulation Action IFC   Electrical Stimulation Parameters to pt tolerance   Electrical Stimulation Goals Pain   Manual Therapy   Manual Therapy Soft tissue mobilization   Myofascial Release bil. SCM, right scalenes, around right mandible; suboccipitals                PT Education - 03/16/15 1219    Education provided No          PT Short Term Goals - 03/16/15 1157    PT SHORT TERM GOAL #1   Title independent with initial HEP   Time 3   Period Weeks   Status Achieved   PT SHORT TERM GOAL #2   Title  sleep with pain decreased >/= 25%   Period Weeks   Status On-going   1 night 25% better   PT SHORT TERM GOAL #3   Title turn head while driving with >/= 89% greater ease   Time 3   Period Weeks   Status On-going  no change   PT SHORT TERM GOAL #4   Title look down at phone with pain decreased >/= 25%   Time 3   Period Weeks   Status Achieved  25% better           PT Long Term Goals - 03/02/15 1132    PT LONG TERM GOAL #1   Title independent with HEP and understand how to progress   Time 6   Period Weeks   Status New   PT LONG TERM GOAL #2   Title full cervical rotation and thoracic rotation so she can turn head with driving without difficulty   Time 6   Period Weeks   Status New   PT LONG TERM GOAL #3   Title look down at phone with >/= 75% decrease in pain   Time 6   Period Weeks   Status New   PT LONG TERM GOAL #4   Title sleep with pain decreased >/= 75%   Time 6   Period Weeks   Status New   PT LONG TERM GOAL #5   Title headache less than 1-2 times per week   Time 6   Period Weeks   Status New               Plan - 03/17/15 1108    Clinical Impression Statement Pt presents with pain at available end range of cervical movement. Pt with palpaple tenderness in upper back and cervical spine. Pt will continue to bendfit from skilled PT to adress pain and muscle tension   Pt will benefit from skilled therapeutic intervention in order to improve on the following deficits Decreased activity tolerance;Impaired flexibility;Hypomobility;Decreased strength;Decreased range of motion;Decreased endurance;Decreased mobility;Increased muscle spasms;Pain   Rehab Potential Excellent   PT Frequency 2x / week   PT Duration 6 weeks   PT Treatment/Interventions Cryotherapy;Electrical Stimulation;Traction;Moist Heat;Therapeutic activities;Therapeutic exercise;Neuromuscular re-education;Manual techniques;Patient/family education;Passive range of motion   Consulted and Agree  with Plan of Care Patient        Problem List Patient Active Problem List   Diagnosis Date Noted  . Cervicogenic headache 02/10/2015  . Neck pain 02/10/2015  . Breast cancer  02/10/2015  . Hot flashes 10/26/2014  . GAD (generalized anxiety disorder) 10/26/2014  . Peripheral neuropathy due to chemotherapy 03/12/2014  . Breast cancer of lower-outer quadrant of right female breast 09/15/2013  . Left shoulder pain - seeing Dr. Lorin Mercy 08/20/2013  . Hypertension     NAUMANN-HOUEGNIFIO,Artemisa Sladek PTA 03/17/2015, 11:14 AM  [facility] [address] [place], [place], [address] Phone: [phone]   Fax:  [phone]

## 2015-03-24 ENCOUNTER — Ambulatory Visit: Payer: BLUE CROSS/BLUE SHIELD | Admitting: Physical Therapy

## 2015-03-24 DIAGNOSIS — R51 Headache: Secondary | ICD-10-CM

## 2015-03-24 DIAGNOSIS — M542 Cervicalgia: Secondary | ICD-10-CM

## 2015-03-24 DIAGNOSIS — R519 Headache, unspecified: Secondary | ICD-10-CM

## 2015-03-24 NOTE — Therapy (Signed)
[facility] [address] [place], [place], [address] Phone: [phone]   Fax:  [phone]  Physical Therapy Treatment  Patient Details  Name: Tina [name] MRN: [id number] Date of Birth: Nov 03, 1960 Referring Provider:  [name], MD  Encounter Date: 03/24/2015      PT End of Session - 03/24/15 1110    Visit Number 6   Date for PT Re-Evaluation 04/13/15   PT Start Time 1108   PT Stop Time 1205   PT Time Calculation (min) 57 min   Activity Tolerance Patient tolerated treatment well   Behavior During Therapy [facility] for tasks assessed/performed      Past Medical History  Diagnosis Date  . Leukocytopenia   . Hypertension   . Leukopenia 06/16/2013  . Needs flu shot 06/17/2013    resolved - flu shot recvd in 04/2014  . Cancer of right breast 09/03/13    Invasive Ductal Carcinoma/Ductal Carcinoma Insitu  biopsies  . Anxiety   . GERD (gastroesophageal reflux disease)   . Degenerative joint disease     back neck and shoulder  . 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 S/P radiation therapy 04/27/2014-06/07/2014    1) Right breast, 50 Gy in 25 fractions/ 2) Right breast boost, 10 Gy in 5 fractions    Past Surgical History  Procedure Laterality Date  . Cesarean section      x 3  . Carpal tunnel release  1998    rt  . Trigger finger release  2004    rt thumb  . Portacath placement  08/2013    still currently active   . Tubal ligation    . Trigger finger release left thumb  06/28/14    [facility]  . Wisdom tooth extraction    . Breast surgery  03/01/2014    hx right breast cancer- 3 lymph nodes removed and lumpectomy  . Robotic assisted bilateral salpingo oopherectomy Bilateral 07/09/2014    Procedure: ROBOTIC ASSISTED BILATERAL SALPINGO OOPHORECTOM; :LYSIS OF ADHESIONS;  Surgeon: Marvene Staff, MD;  Location: [facility];  Service: Gynecology;  Laterality:  Bilateral;    There were no vitals filed for this visit.  Visit Diagnosis:  Neck pain  Frequent headaches      Subjective Assessment - 03/24/15 1118    Subjective I feel 30% better.    Pertinent History Breast cancer with radiation and chemotherapy, bulging disc in lumbar, per pt report diagnosed with lupus   How long can you sit comfortably? None   How long can you stand comfortably? None   How long can you walk comfortably? None   Diagnostic tests carpal tunnel in left wrist   Patient Stated Goals turn head without pain, drive without being nervous of getting into an accident   Currently in Pain? Yes   Pain Score 5    Pain Location Neck   Pain Orientation Right;Left   Pain Descriptors / Indicators Dull;Tightness   Pain Type Chronic pain   Pain Radiating Towards radiates into left shoulder blade   Pain Onset More than a month ago   Pain Frequency Intermittent   Aggravating Factors  turning head   Pain Relieving Factors heat   Effect of Pain on Daily Activities difficulty with sleeping, looking down at phone, turning head   Multiple Pain Sites No            [facility] PT Assessment - 03/24/15 0001  Assessment   Medical Diagnosis R51 cervicaogenic headach; M54.2 Neck pain; C50.919 breast cancer, unspecified lateralty   Precautions   Precautions Other (comment)  No ultrasound   Precaution Comments No ultrasound   Prior Function   Level of Independence Independent with basic ADLs   Cognition   Overall Cognitive Status Within Functional Limits for tasks assessed   AROM   Cervical - Left Side Bend full   Cervical - Right Rotation decreased by 25%   Cervical - Left Rotation decreased by 25%   Strength   Left Shoulder Flexion 4/5   Left Shoulder ABduction 4/5   Left Shoulder Internal Rotation 4+/5   Left Shoulder External Rotation 5/5                     [facility] Adult PT Treatment/Exercise - 03/24/15 0001    Neck Exercises: Supine   Neck Retraction 10  reps;5 secs  into red physioball   Capital Flexion 10 reps  on red physioball   Cervical Rotation 10 reps;Left;Right  on red physioball   Shoulder Flexion Right;Left;20 reps;Weights   Shoulder Flexion Weights (lbs) 1   Other Supine Exercise press head into ball with horizontal abduction with green band 10x then diagonals   Modalities   Modalities Moist Heat;Electrical Stimulation   Moist Heat Therapy   Number Minutes Moist Heat 15 Minutes   Moist Heat Location Cervical   Electrical Stimulation   Electrical Stimulation Location bil neck and UT   Electrical Stimulation Action IFC   Electrical Stimulation Parameters to patient tolerance   Electrical Stimulation Goals Pain   Manual Therapy   Manual Therapy Soft tissue mobilization   Soft tissue mobilization cervical paraspinals and suboccipitals                PT Education - 03/24/15 1138    Education provided No          PT Short Term Goals - 03/24/15 1110    PT SHORT TERM GOAL #1   Title independent with initial HEP   Time 3   Period Weeks   Status Achieved   PT SHORT TERM GOAL #2   Title sleep with pain decreased >/= 25%   Time 3   Period Weeks   Status Achieved   PT SHORT TERM GOAL #3   Title turn head while driving with >/= 16% greater ease   Time 3   Period Weeks   Status Achieved   PT SHORT TERM GOAL #4   Title look down at phone with pain decreased >/= 25%   Time 3   Period Weeks   Status Achieved           PT Long Term Goals - 03/02/15 1132    PT LONG TERM GOAL #1   Title independent with HEP and understand how to progress   Time 6   Period Weeks   Status New   PT LONG TERM GOAL #2   Title full cervical rotation and thoracic rotation so she can turn head with driving without difficulty   Time 6   Period Weeks   Status New   PT LONG TERM GOAL #3   Title look down at phone with >/= 75% decrease in pain   Time 6   Period Weeks   Status New   PT LONG TERM GOAL #4   Title sleep with  pain decreased >/= 75%   Time 6   Period Weeks   Status New  PT LONG TERM GOAL #5   Title headache less than 1-2 times per week   Time 6   Period Weeks   Status New               Plan - 03/24/15 1119    Clinical Impression Statement Patient is a 54 year old female with diagnosis of neck pain.  Patient reports she is 30% better. Patient has met all her STG's. Pain has decreased by 25% with sleeping.  Patient has not had a headache in 2 days.  Patient reports 25% easier to turn her head and look down at her phone. Cervical rotation bilaterally is decreased by 25% but all other motions is full. left shoulder flexion 4/5.  Left shoulder abduction increased to 4/5.  Left external rotation increased to 5/5 and internal rotation increased to 4+/5.    Pt will benefit from skilled therapeutic intervention in order to improve on the following deficits Decreased activity tolerance;Impaired flexibility;Hypomobility;Decreased strength;Decreased range of motion;Decreased endurance;Decreased mobility;Increased muscle spasms;Pain   Rehab Potential Excellent   Clinical Impairments Affecting Rehab Potential None   PT Frequency 2x / week   PT Duration 6 weeks   PT Treatment/Interventions Cryotherapy;Electrical Stimulation;Traction;Moist Heat;Therapeutic activities;Therapeutic exercise;Neuromuscular re-education;Manual techniques;Patient/family education;Passive range of motion   PT Next Visit Plan left shoulder flexion strength and abduction   PT Home Exercise Plan progress as needed   Consulted and Agree with Plan of Care Patient        Problem List Patient Active Problem List   Diagnosis Date Noted  . Cervicogenic headache 02/10/2015  . Neck pain 02/10/2015  . Breast cancer 02/10/2015  . Hot flashes 10/26/2014  . GAD (generalized anxiety disorder) 10/26/2014  . Peripheral neuropathy due to chemotherapy 03/12/2014  . Breast cancer of lower-outer quadrant of right female breast 09/15/2013   . Left shoulder pain - seeing Dr. Lorin Mercy 08/20/2013  . Hypertension     Tylik Treese ,PT 03/24/2015, 11:39 AM  [facility] [address] [place], [place], [address] Phone: [phone]   Fax:  [phone]

## 2015-03-25 ENCOUNTER — Ambulatory Visit: Payer: BLUE CROSS/BLUE SHIELD | Admitting: Physical Therapy

## 2015-03-25 ENCOUNTER — Encounter: Payer: Self-pay | Admitting: Physical Therapy

## 2015-03-25 DIAGNOSIS — M791 Myalgia, unspecified site: Secondary | ICD-10-CM

## 2015-03-25 DIAGNOSIS — R519 Headache, unspecified: Secondary | ICD-10-CM

## 2015-03-25 DIAGNOSIS — M542 Cervicalgia: Secondary | ICD-10-CM | POA: Diagnosis not present

## 2015-03-25 DIAGNOSIS — I890 Lymphedema, not elsewhere classified: Secondary | ICD-10-CM

## 2015-03-25 DIAGNOSIS — R51 Headache: Secondary | ICD-10-CM

## 2015-03-25 DIAGNOSIS — M25551 Pain in right hip: Secondary | ICD-10-CM

## 2015-03-25 DIAGNOSIS — R413 Other amnesia: Secondary | ICD-10-CM

## 2015-03-25 NOTE — Therapy (Signed)
[facility] [address] [place], [place], [address] Phone: [phone]   Fax:  [phone]  Physical Therapy Treatment  Patient Details  Name: Tina [name] MRN: [id number] Date of Birth: Feb 09, 1961 Referring Provider:  [name], MD  Encounter Date: 03/25/2015      PT End of Session - 03/25/15 1121    Visit Number 7   Date for PT Re-Evaluation 04/13/15   PT Start Time 1103   PT Stop Time 1158   PT Time Calculation (min) 55 min   Activity Tolerance Patient tolerated treatment well   Behavior During Therapy [facility] for tasks assessed/performed      Past Medical History  Diagnosis Date  . Leukocytopenia   . Hypertension   . Leukopenia 06/16/2013  . Needs flu shot 06/17/2013    resolved - flu shot recvd in 04/2014  . Cancer of right breast 09/03/13    Invasive Ductal Carcinoma/Ductal Carcinoma Insitu  biopsies  . Anxiety   . GERD (gastroesophageal reflux disease)   . Degenerative joint disease     back neck and shoulder  . 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 S/P radiation therapy 04/27/2014-06/07/2014    1) Right breast, 50 Gy in 25 fractions/ 2) Right breast boost, 10 Gy in 5 fractions    Past Surgical History  Procedure Laterality Date  . Cesarean section      x 3  . Carpal tunnel release  1998    rt  . Trigger finger release  2004    rt thumb  . Portacath placement  08/2013    still currently active   . Tubal ligation    . Trigger finger release left thumb  06/28/14    at [facility]  . Wisdom tooth extraction    . Breast surgery  03/01/2014    hx right breast cancer- 3 lymph nodes removed and lumpectomy  . Robotic assisted bilateral salpingo oopherectomy Bilateral 07/09/2014    Procedure: ROBOTIC ASSISTED BILATERAL SALPINGO OOPHORECTOM; :LYSIS OF ADHESIONS;  Surgeon: Marvene Staff, MD;  Location: [facility];  Service: Gynecology;  Laterality:  Bilateral;    There were no vitals filed for this visit.  Visit Diagnosis:  Neck pain  Frequent headaches  Muscle pain  Memory loss  Right hip pain  Lymphedema of breast      Subjective Assessment - 03/25/15 1120    Subjective Pt reports compliance with exercises and feels improvement   Currently in Pain? Yes   Pain Score 5    Pain Location Neck   Pain Orientation Right;Left   Pain Descriptors / Indicators Dull;Tightness   Pain Type Chronic pain   Pain Onset More than a month ago   Pain Frequency Intermittent   Multiple Pain Sites No                         [facility] Adult PT Treatment/Exercise - 03/25/15 1145    Exercises   Exercises Neck   Neck Exercises: Seated   Other Seated Exercise Levator and UT stretch 3x20 seconds   Neck Exercises: Supine   Neck Retraction 10 reps;5 secs  into red physioball   Capital Flexion 10 reps  on red physioball   Cervical Rotation 10 reps;Left;Right  on red phsyioball    Shoulder Flexion Right;Left;20 reps;Weights   Shoulder Flexion Weights (lbs) 1   Modalities   Modalities Moist Heat;Electrical Stimulation  Moist Heat Therapy   Moist Heat Location Cervical   Electrical Stimulation   Electrical Stimulation Location bil neck and UT   Electrical Stimulation Goals Pain   Manual Therapy   Manual Therapy Soft tissue mobilization   Myofascial Release bil. SCM, right scalenes, around right mandible; suboccipitals                PT Education - 03/24/15 1138    Education provided No          PT Short Term Goals - 03/24/15 1110    PT SHORT TERM GOAL #1   Title independent with initial HEP   Time 3   Period Weeks   Status Achieved   PT SHORT TERM GOAL #2   Title sleep with pain decreased >/= 25%   Time 3   Period Weeks   Status Achieved   PT SHORT TERM GOAL #3   Title turn head while driving with >/= 24% greater ease   Time 3   Period Weeks   Status Achieved   PT SHORT TERM GOAL #4   Title  look down at phone with pain decreased >/= 25%   Time 3   Period Weeks   Status Achieved           PT Long Term Goals - 03/02/15 1132    PT LONG TERM GOAL #1   Title independent with HEP and understand how to progress   Time 6   Period Weeks   Status New   PT LONG TERM GOAL #2   Title full cervical rotation and thoracic rotation so she can turn head with driving without difficulty   Time 6   Period Weeks   Status New   PT LONG TERM GOAL #3   Title look down at phone with >/= 75% decrease in pain   Time 6   Period Weeks   Status New   PT LONG TERM GOAL #4   Title sleep with pain decreased >/= 75%   Time 6   Period Weeks   Status New   PT LONG TERM GOAL #5   Title headache less than 1-2 times per week   Time 6   Period Weeks   Status New               Plan - 03/25/15 1122    Clinical Impression Statement Pt is a 54 year old female cnacer survivor with complains of neck pain. Pt bil rotation is limited by 25% all other motions are full. Pt will continue tobenefit from skilled PT   Pt will benefit from skilled therapeutic intervention in order to improve on the following deficits Decreased activity tolerance;Impaired flexibility;Hypomobility;Decreased strength;Decreased range of motion;Decreased endurance;Decreased mobility;Increased muscle spasms;Pain   Rehab Potential Excellent   PT Frequency 2x / week   PT Duration 6 weeks   PT Treatment/Interventions Cryotherapy;Electrical Stimulation;Traction;Moist Heat;Therapeutic activities;Therapeutic exercise;Neuromuscular re-education;Manual techniques;Patient/family education;Passive range of motion   PT Next Visit Plan left shoulder flexion strength and abduction   PT Home Exercise Plan progress as needed   Consulted and Agree with Plan of Care Patient        Problem List Patient Active Problem List   Diagnosis Date Noted  . Cervicogenic headache 02/10/2015  . Neck pain 02/10/2015  . Breast cancer 02/10/2015   . Hot flashes 10/26/2014  . GAD (generalized anxiety disorder) 10/26/2014  . Peripheral neuropathy due to chemotherapy 03/12/2014  . Breast cancer of lower-outer quadrant of right female breast  09/15/2013  . Left shoulder pain - seeing Dr. Lorin Mercy 08/20/2013  . Hypertension     NAUMANN-HOUEGNIFIO,Tamarion Haymond PTA 03/25/2015, 11:51 AM  [facility] [address], [place], [place], [address] Phone: [phone]   Fax:  [phone]

## 2015-03-27 ENCOUNTER — Other Ambulatory Visit: Payer: Self-pay | Admitting: Family Medicine

## 2015-03-28 ENCOUNTER — Encounter: Payer: Self-pay | Admitting: Physical Therapy

## 2015-03-28 ENCOUNTER — Ambulatory Visit: Payer: BLUE CROSS/BLUE SHIELD | Admitting: Physical Therapy

## 2015-03-28 DIAGNOSIS — M542 Cervicalgia: Secondary | ICD-10-CM | POA: Diagnosis not present

## 2015-03-28 DIAGNOSIS — R519 Headache, unspecified: Secondary | ICD-10-CM

## 2015-03-28 DIAGNOSIS — R51 Headache: Secondary | ICD-10-CM

## 2015-03-28 NOTE — Therapy (Signed)
[facility] [address] [place], [place], [address] Phone: [phone]   Fax:  [phone]  Physical Therapy Treatment  Patient Details  Name: Tina [name] MRN: [id number] Date of Birth: 1961/01/22 Referring Provider:  [name], MD  Encounter Date: 03/28/2015      PT End of Session - 03/28/15 1416    Visit Number 8   Date for PT Re-Evaluation 04/13/15   PT Start Time 2505   PT Stop Time 1500   PT Time Calculation (min) 45 min   Activity Tolerance Patient tolerated treatment well   Behavior During Therapy [facility] for tasks assessed/performed      Past Medical History  Diagnosis Date  . Leukocytopenia   . Hypertension   . Leukopenia 06/16/2013  . Needs flu shot 06/17/2013    resolved - flu shot recvd in 04/2014  . Cancer of right breast 09/03/13    Invasive Ductal Carcinoma/Ductal Carcinoma Insitu  biopsies  . Anxiety   . GERD (gastroesophageal reflux disease)   . Degenerative joint disease     back neck and shoulder  . 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 S/P radiation therapy 04/27/2014-06/07/2014    1) Right breast, 50 Gy in 25 fractions/ 2) Right breast boost, 10 Gy in 5 fractions    Past Surgical History  Procedure Laterality Date  . Cesarean section      x 3  . Carpal tunnel release  1998    rt  . Trigger finger release  2004    rt thumb  . Portacath placement  08/2013    still currently active   . Tubal ligation    . Trigger finger release left thumb  06/28/14    at [facility]opedic surgical center  . Wisdom tooth extraction    . Breast surgery  03/01/2014    hx right breast cancer- 3 lymph nodes removed and lumpectomy  . Robotic assisted bilateral salpingo oopherectomy Bilateral 07/09/2014    Procedure: ROBOTIC ASSISTED BILATERAL SALPINGO OOPHORECTOM; :LYSIS OF ADHESIONS;  Surgeon: Marvene Staff, MD;  Location: [facility];  Service: Gynecology;  Laterality:  Bilateral;    There were no vitals filed for this visit.  Visit Diagnosis:  Neck pain  Frequent headaches      Subjective Assessment - 03/28/15 1416    Subjective I feel good.  Patient was 15 minutes late. I still feel like I have a kink in my neck.  I feel it when I turn my head. Neck pain improved by 30%.  Patient has not had any headaches.    Pertinent History Breast cancer with radiation and chemotherapy, bulging disc in lumbar, per pt report diagnosed with lupus   How long can you sit comfortably? None   How long can you stand comfortably? None   How long can you walk comfortably? None   Diagnostic tests carpal tunnel in left wrist   Patient Stated Goals turn head without pain, drive without being nervous of getting into an accident   Currently in Pain? Yes   Pain Score 5    Pain Location Neck   Pain Orientation Right;Left   Pain Descriptors / Indicators Tightness   Pain Type Chronic pain   Pain Radiating Towards into left hsoulder blade and back of head.    Pain Onset More than a month ago   Pain Frequency Intermittent   Aggravating Factors  turning head   Pain Relieving Factors heat  Effect of Pain on Daily Activities difficulty with turning head.    Multiple Pain Sites No            [facility] PT Assessment - 03/28/15 0001    Assessment   Medical Diagnosis R51 cervicaogenic headach; M54.2 Neck pain; C50.919 breast cancer, unspecified lateralty   AROM   Cervical - Right Rotation decreased by 25%   Cervical - Left Rotation decreased by 25%   Thoracic - Left Rotation full                     [facility] Adult PT Treatment/Exercise - 03/28/15 0001    Neck Exercises: Seated   Cervical Rotation Right;Left;5 reps  using snags procedure   Modalities   Modalities Moist Heat;Electrical Stimulation   Moist Heat Therapy   Number Minutes Moist Heat 15 Minutes   Moist Heat Location Cervical   Electrical Stimulation   Electrical Stimulation Location bil neck and  UT  supine   Electrical Stimulation Action IFC   Electrical Stimulation Parameters to patients tolerance   Electrical Stimulation Goals Pain   Manual Therapy   Manual Therapy Soft tissue mobilization;Joint mobilization   Joint Mobilization C1-C4 to improve rotation   Soft tissue mobilization cervical paraspinals and suboccipitals                PT Education - 03/28/15 1424    Education provided Yes   Education Details cervical rotation overpressure with pillowcase with handout   Person(s) Educated Patient   Methods Explanation;Demonstration;Verbal cues;Handout   Comprehension Returned demonstration;Verbalized understanding          PT Short Term Goals - 03/24/15 1110    PT SHORT TERM GOAL #1   Title independent with initial HEP   Time 3   Period Weeks   Status Achieved   PT SHORT TERM GOAL #2   Title sleep with pain decreased >/= 25%   Time 3   Period Weeks   Status Achieved   PT SHORT TERM GOAL #3   Title turn head while driving with >/= 67% greater ease   Time 3   Period Weeks   Status Achieved   PT SHORT TERM GOAL #4   Title look down at phone with pain decreased >/= 25%   Time 3   Period Weeks   Status Achieved           PT Long Term Goals - 03/28/15 1444    PT LONG TERM GOAL #1   Title independent with HEP and understand how to progress   Time 6   Period Weeks   Status New  still learning exercises   PT LONG TERM GOAL #2   Title full cervical rotation and thoracic rotation so she can turn head with driving without difficulty   Time 6   Period Weeks   Status New  decreased by 10%   PT LONG TERM GOAL #3   Title look down at phone with >/= 75% decrease in pain   Time 6   Period Weeks   Status New  30% better   PT LONG TERM GOAL #4   Title sleep with pain decreased >/= 75%   Time 6   Period Weeks   Status New  30% better   PT LONG TERM GOAL #5   Title headache less than 1-2 times per week   Time 6   Period Weeks   Status Achieved  Plan - 03/28/15 1445    Clinical Impression Statement Patient is a 54 year old female cancer survivor with neck pain.  Bil. rotation decreased by 10% and bil thoracic rotation is full.  Patient reports pain is improved by 30%.  Patient reports when she turns her head feels like she has a kink in her neck.  Patient has tighness in left cervical paraspinals.  Patient has met her headache goals due to no headaches. Patient will benefit from physical therapy to improve ROM and decrease pain.    Pt will benefit from skilled therapeutic intervention in order to improve on the following deficits Decreased activity tolerance;Impaired flexibility;Hypomobility;Decreased strength;Decreased range of motion;Decreased endurance;Decreased mobility;Increased muscle spasms;Pain   Rehab Potential Excellent   Clinical Impairments Affecting Rehab Potential None   PT Frequency 2x / week   PT Duration 6 weeks   PT Treatment/Interventions Cryotherapy;Electrical Stimulation;Traction;Moist Heat;Therapeutic activities;Therapeutic exercise;Neuromuscular re-education;Manual techniques;Patient/family education;Passive range of motion   PT Next Visit Plan left shoulder flexion strength and abduction   PT Home Exercise Plan progress as needed   Consulted and Agree with Plan of Care Patient        Problem List Patient Active Problem List   Diagnosis Date Noted  . Cervicogenic headache 02/10/2015  . Neck pain 02/10/2015  . Breast cancer 02/10/2015  . Hot flashes 10/26/2014  . GAD (generalized anxiety disorder) 10/26/2014  . Peripheral neuropathy due to chemotherapy 03/12/2014  . Breast cancer of lower-outer quadrant of right female breast 09/15/2013  . Left shoulder pain - seeing Dr. Lorin Mercy 08/20/2013  . Hypertension     Jasmain Ahlberg,PT 03/28/2015, 2:48 PM  [facility] [address], [place], [place], [address] Phone: [phone]    Fax:  [phone]

## 2015-03-30 ENCOUNTER — Ambulatory Visit: Payer: BLUE CROSS/BLUE SHIELD | Admitting: Physical Therapy

## 2015-03-30 ENCOUNTER — Encounter: Payer: Self-pay | Admitting: Physical Therapy

## 2015-03-30 DIAGNOSIS — M542 Cervicalgia: Secondary | ICD-10-CM

## 2015-03-30 DIAGNOSIS — R519 Headache, unspecified: Secondary | ICD-10-CM

## 2015-03-30 DIAGNOSIS — M791 Myalgia, unspecified site: Secondary | ICD-10-CM

## 2015-03-30 DIAGNOSIS — R51 Headache: Secondary | ICD-10-CM

## 2015-03-30 NOTE — Therapy (Signed)
[facility] [address] [place], [place], [address] Phone: [phone]   Fax:  [phone]  Physical Therapy Treatment  Patient Details  Name: Tina [name] MRN: [id number] Date of Birth: [birth year] Referring Provider:  [name], DO  Encounter Date: 03/30/2015      PT End of Session - 03/30/15 1305    Visit Number 9   Date for PT Re-Evaluation 04/13/15   PT Start Time 1230   PT Stop Time 1315   PT Time Calculation (min) 45 min   Activity Tolerance Patient tolerated treatment well   Behavior During Therapy [facility] for tasks assessed/performed      Past Medical History  Diagnosis Date  . Leukocytopenia   . Hypertension   . Leukopenia 06/16/2013  . Needs flu shot 06/17/2013    resolved - flu shot recvd in 04/2014  . Cancer of right breast 09/03/13    Invasive Ductal Carcinoma/Ductal Carcinoma Insitu  biopsies  . Anxiety   . GERD (gastroesophageal reflux disease)   . Degenerative joint disease     back neck and shoulder  . 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 S/P radiation therapy 04/27/2014-06/07/2014    1) Right breast, 50 Gy in 25 fractions/ 2) Right breast boost, 10 Gy in 5 fractions    Past Surgical History  Procedure Laterality Date  . Cesarean section      x 3  . Carpal tunnel release  1998    rt  . Trigger finger release  2004    rt thumb  . Portacath placement  08/2013    still currently active   . Tubal ligation    . Trigger finger release left thumb  06/28/14    [facility]  . Wisdom tooth extraction    . Breast surgery  03/01/2014    hx right breast cancer- 3 lymph nodes removed and lumpectomy  . Robotic assisted bilateral salpingo oopherectomy Bilateral 07/09/2014    Procedure: ROBOTIC ASSISTED BILATERAL SALPINGO OOPHORECTOM; :LYSIS OF ADHESIONS;  Surgeon: Marvene Staff, MD;  Location: [facility];  Service: Gynecology;  Laterality:  Bilateral;    There were no vitals filed for this visit.  Visit Diagnosis:  Neck pain  Frequent headaches  Muscle pain      Subjective Assessment - 03/30/15 1237    Subjective I saw md about my neck and said the disc are pressing into spinal canal and surgery in the future. Patient reports 30% better due to no headaches.    Pertinent History Breast cancer with radiation and chemotherapy, bulging disc in lumbar, per pt report diagnosed with lupus   How long can you sit comfortably? None   How long can you stand comfortably? None   How long can you walk comfortably? None   Diagnostic tests carpal tunnel in left wrist   Patient Stated Goals turn head without pain, drive without being nervous of getting into an accident   Currently in Pain? Yes   Pain Score 5    Pain Location Neck   Pain Orientation Right;Left   Pain Descriptors / Indicators Tightness   Pain Type Chronic pain   Pain Radiating Towards into left shoulder blade and back of head   Pain Onset More than a month ago   Pain Frequency Intermittent   Aggravating Factors  turning head   Pain Relieving Factors heat   Multiple Pain Sites No  [place] Adult PT Treatment/Exercise - 03/30/15 0001    Neck Exercises: Supine   Neck Retraction 10 reps;5 secs  into red physioball   Shoulder Flexion Right;Left;20 reps;Weights   Shoulder Flexion Weights (lbs) 2   Shoulder ABduction Both;15 reps  press head into ball, horizontal abduction   Shoulder Abduction Weights (lbs) 2   Modalities   Modalities Traction   Moist Heat Therapy   Number Minutes Moist Heat 15 Minutes   Moist Heat Location Cervical   Traction   Type of Traction Cervical  intermittent   Min (lbs) 5   Max (lbs) 15   Time 15                PT Education - 03/30/15 1254    Education provided Yes   Education Details educated patient on other options for pain relief    Person(s) Educated Patient   Methods  Explanation   Comprehension Verbalized understanding          PT Short Term Goals - 03/24/15 1110    PT SHORT TERM GOAL #1   Title independent with initial HEP   Time 3   Period Weeks   Status Achieved   PT SHORT TERM GOAL #2   Title sleep with pain decreased >/= 25%   Time 3   Period Weeks   Status Achieved   PT SHORT TERM GOAL #3   Title turn head while driving with >/= 93% greater ease   Time 3   Period Weeks   Status Achieved   PT SHORT TERM GOAL #4   Title look down at phone with pain decreased >/= 25%   Time 3   Period Weeks   Status Achieved           PT Long Term Goals - 03/28/15 1444    PT LONG TERM GOAL #1   Title independent with HEP and understand how to progress   Time 6   Period Weeks   Status New  still learning exercises   PT LONG TERM GOAL #2   Title full cervical rotation and thoracic rotation so she can turn head with driving without difficulty   Time 6   Period Weeks   Status New  decreased by 10%   PT LONG TERM GOAL #3   Title look down at phone with >/= 75% decrease in pain   Time 6   Period Weeks   Status New  30% better   PT LONG TERM GOAL #4   Title sleep with pain decreased >/= 75%   Time 6   Period Weeks   Status New  30% better   PT LONG TERM GOAL #5   Title headache less than 1-2 times per week   Time 6   Period Weeks   Status Achieved               Plan - 03/30/15 1306    Clinical Impression Statement Patient is a 54 year old female cancer survivor with neck pain. Patient had traction with no change in pain.  Patient has a home traction unit that she will use on occasion.  Patient will benefit from physical therpay to improve ROM and decrease strength.    Pt will benefit from skilled therapeutic intervention in order to improve on the following deficits Decreased activity tolerance;Impaired flexibility;Hypomobility;Decreased strength;Decreased range of motion;Decreased endurance;Decreased mobility;Increased  muscle spasms;Pain   Rehab Potential Excellent   Clinical Impairments Affecting Rehab Potential None   PT  Frequency 2x / week   PT Duration 6 weeks   PT Treatment/Interventions Cryotherapy;Electrical Stimulation;Traction;Moist Heat;Therapeutic activities;Therapeutic exercise;Neuromuscular re-education;Manual techniques;Patient/family education;Passive range of motion   PT Next Visit Plan see if traction helped   PT Home Exercise Plan progress as needed   Consulted and Agree with Plan of Care Patient        Problem List Patient Active Problem List   Diagnosis Date Noted  . Cervicogenic headache 02/10/2015  . Neck pain 02/10/2015  . Breast cancer 02/10/2015  . Hot flashes 10/26/2014  . GAD (generalized anxiety disorder) 10/26/2014  . Peripheral neuropathy due to chemotherapy 03/12/2014  . Breast cancer of lower-outer quadrant of right female breast 09/15/2013  . Left shoulder pain - seeing Dr. Lorin Mercy 08/20/2013  . Hypertension     GRAY,CHERYL ,PT  03/30/2015, 1:15 PM  [facility] [address] [place], [place], [address] Phone: [phone]   Fax:  [phone]

## 2015-04-05 ENCOUNTER — Ambulatory Visit: Payer: BLUE CROSS/BLUE SHIELD | Attending: Neurology | Admitting: Physical Therapy

## 2015-04-05 ENCOUNTER — Encounter: Payer: Self-pay | Admitting: Physical Therapy

## 2015-04-05 DIAGNOSIS — M542 Cervicalgia: Secondary | ICD-10-CM | POA: Diagnosis not present

## 2015-04-05 DIAGNOSIS — M791 Myalgia, unspecified site: Secondary | ICD-10-CM

## 2015-04-05 NOTE — Patient Instructions (Addendum)
Home traction unit prop up 3-4 inches using a block.  Keep on for 10-15 minutes.  10-12 pounds.   [place] [address], [place], [place] [address] Phone # [phone] Fax [phone]

## 2015-04-05 NOTE — Therapy (Signed)
[facility] [address], [place], [place], [address] Phone: [phone]   Fax:  [phone]  Physical Therapy Treatment  Patient Details  Name: Tina [name] MRN: [id number] Date of Birth: Sep 10, 1960 Referring Provider:  [name], DO  Encounter Date: 04/05/2015      PT End of Session - 04/05/15 1442    Visit Number 10   Date for PT Re-Evaluation 04/13/15   Authorization Type BCBS   Authorization - Visit Number 18   Authorization - Number of Visits 30   PT Start Time 1400   PT Stop Time 1450   PT Time Calculation (min) 50 min   Activity Tolerance Patient tolerated treatment well   Behavior During Therapy [facility] for tasks assessed/performed      Past Medical History  Diagnosis Date  . Leukocytopenia   . Hypertension   . Leukopenia 06/16/2013  . Needs flu shot 06/17/2013    resolved - flu shot recvd in 04/2014  . Cancer of right breast 09/03/13    Invasive Ductal Carcinoma/Ductal Carcinoma Insitu  biopsies  . Anxiety   . GERD (gastroesophageal reflux disease)   . Degenerative joint disease     back neck and shoulder  . 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 S/P radiation therapy 04/27/2014-06/07/2014    1) Right breast, 50 Gy in 25 fractions/ 2) Right breast boost, 10 Gy in 5 fractions    Past Surgical History  Procedure Laterality Date  . Cesarean section      x 3  . Carpal tunnel release  1998    rt  . Trigger finger release  2004    rt thumb  . Portacath placement  08/2013    still currently active   . Tubal ligation    . Trigger finger release left thumb  06/28/14    [facility]  . Wisdom tooth extraction    . Breast surgery  03/01/2014    hx right breast cancer- 3 lymph nodes removed and lumpectomy  . Robotic assisted bilateral salpingo oopherectomy Bilateral 07/09/2014    Procedure: ROBOTIC ASSISTED BILATERAL SALPINGO OOPHORECTOM; :LYSIS OF  ADHESIONS;  Surgeon: Marvene Staff, MD;  Location: [facility];  Service: Gynecology;  Laterality: Bilateral;    There were no vitals filed for this visit.  Visit Diagnosis:  Neck pain  Muscle pain      Subjective Assessment - 04/05/15 1404    Subjective the traction helped. My traction unit is flat. Radiating into left shoulder blade is 50% better.    Pertinent History Breast cancer with radiation and chemotherapy, bulging disc in lumbar, per pt report diagnosed with lupus   How long can you sit comfortably? None   How long can you stand comfortably? None   How long can you walk comfortably? None   Diagnostic tests carpal tunnel in left wrist   Patient Stated Goals turn head without pain, drive without being nervous of getting into an accident   Currently in Pain? Yes   Pain Score 3    Pain Location Neck   Pain Orientation Right;Left   Pain Descriptors / Indicators Tightness   Pain Type Chronic pain   Pain Radiating Towards into left shoulder blade and back of head   Pain Onset More than a month ago   Pain Frequency Intermittent   Aggravating Factors  turning head   Pain Relieving Factors heat and retraction   Multiple Pain  Sites No                         [facility] Adult PT Treatment/Exercise - 04/05/15 0001    Self-Care   Self-Care Other Self-Care Comments  how to set up a hom etraction unit   Traction   Type of Traction Cervical  intermittent   Min (lbs) 5   Max (lbs) 12   Time 15   Manual Therapy   Manual Therapy Joint mobilization;Soft tissue mobilization;Passive ROM   Joint Mobilization sideglide to C5-C7 grade 2    Soft tissue mobilization paraspinals, suboccipitals, and scalenes   Passive ROM bil. cervical rotation and sidebending 10x each                PT Education - 04/05/15 1437    Education provided Yes   Education Details how to set up home traction unit   Person(s) Educated Patient   Methods Explanation;Handout    Comprehension Verbalized understanding          PT Short Term Goals - 03/24/15 1110    PT SHORT TERM GOAL #1   Title independent with initial HEP   Time 3   Period Weeks   Status Achieved   PT SHORT TERM GOAL #2   Title sleep with pain decreased >/= 25%   Time 3   Period Weeks   Status Achieved   PT SHORT TERM GOAL #3   Title turn head while driving with >/= 65% greater ease   Time 3   Period Weeks   Status Achieved   PT SHORT TERM GOAL #4   Title look down at phone with pain decreased >/= 25%   Time 3   Period Weeks   Status Achieved           PT Long Term Goals - 04/05/15 1441    PT LONG TERM GOAL #1   Title independent with HEP and understand how to progress   Time 6   Period Weeks   Status New  still learning exercises   PT LONG TERM GOAL #2   Title full cervical rotation and thoracic rotation so she can turn head with driving without difficulty   Time 6   Period Weeks   Status On-going  left rotation decreased   PT LONG TERM GOAL #3   Title look down at phone with >/= 75% decrease in pain   Time 6   Period Weeks   Status On-going  30% better   PT LONG TERM GOAL #4   Title sleep with pain decreased >/= 75%   Time 6   Period Weeks   Status New  30% better   PT LONG TERM GOAL #5   Title headache less than 1-2 times per week   Time 6   Period Weeks   Status Achieved               Plan - 04/05/15 1442    Clinical Impression Statement Patient is a 54 year old female cancer survivor with neck pain.  Patient has noticed a difference with traction unit at therapy due to it being elevated.  Patient was educated on how to elevate her traction unit to have the same affect.  Decreased left cervical rotation while doing PROM .  Decreased sideglide for C5-C7 bil. sides.  Patient is learning how to manage her pain at home.  Patient will benefit from physical therapy  to reducae pain and  improve mobility.    Pt will benefit from skilled therapeutic  intervention in order to improve on the following deficits Decreased activity tolerance;Impaired flexibility;Hypomobility;Decreased strength;Decreased range of motion;Decreased endurance;Decreased mobility;Increased muscle spasms;Pain   Rehab Potential Excellent   Clinical Impairments Affecting Rehab Potential None   PT Frequency 2x / week   PT Duration 6 weeks   PT Treatment/Interventions Cryotherapy;Electrical Stimulation;Traction;Moist Heat;Therapeutic activities;Therapeutic exercise;Neuromuscular re-education;Manual techniques;Patient/family education;Passive range of motion   PT Next Visit Plan cervical traction, review HEP, soft tissue work   PT Home Exercise Plan progress as needed   Consulted and Agree with Plan of Care Patient        Problem List Patient Active Problem List   Diagnosis Date Noted  . Cervicogenic headache 02/10/2015  . Neck pain 02/10/2015  . Breast cancer 02/10/2015  . Hot flashes 10/26/2014  . GAD (generalized anxiety disorder) 10/26/2014  . Peripheral neuropathy due to chemotherapy 03/12/2014  . Breast cancer of lower-outer quadrant of right female breast 09/15/2013  . Left shoulder pain - seeing Dr. Lorin Mercy 08/20/2013  . Hypertension     Orah Sonnen,PT 04/05/2015, 2:50 PM  [facility] [address], [place], [place], [address] Phone: [phone]   Fax:  [phone]

## 2015-04-07 ENCOUNTER — Ambulatory Visit: Payer: BLUE CROSS/BLUE SHIELD | Admitting: Physical Therapy

## 2015-04-07 ENCOUNTER — Encounter: Payer: Self-pay | Admitting: Physical Therapy

## 2015-04-07 DIAGNOSIS — M542 Cervicalgia: Secondary | ICD-10-CM | POA: Diagnosis not present

## 2015-04-07 DIAGNOSIS — M791 Myalgia, unspecified site: Secondary | ICD-10-CM

## 2015-04-07 NOTE — Therapy (Signed)
[facility] [address], [place], [place], [address] Phone: [phone]   Fax:  [phone]  Physical Therapy Treatment  Patient Details  Name: Tina [name] MRN: [id number] Date of Birth: Nov 30, 1960 Referring Provider:  [name], MD  Encounter Date: 04/07/2015      PT End of Session - 04/07/15 1241    Visit Number 11   Date for PT Re-Evaluation 04/13/15   Authorization Type BCBS   Authorization - Visit Number 40   Authorization - Number of Visits 30   PT Start Time 1237   PT Stop Time 1315   PT Time Calculation (min) 38 min   Activity Tolerance Patient tolerated treatment well   Behavior During Therapy [facility] for tasks assessed/performed      Past Medical History  Diagnosis Date  . Leukocytopenia   . Hypertension   . Leukopenia 06/16/2013  . Needs flu shot 06/17/2013    resolved - flu shot recvd in 04/2014  . Cancer of right breast 09/03/13    Invasive Ductal Carcinoma/Ductal Carcinoma Insitu  biopsies  . Anxiety   . GERD (gastroesophageal reflux disease)   . Degenerative joint disease     back neck and shoulder  . 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 S/P radiation therapy 04/27/2014-06/07/2014    1) Right breast, 50 Gy in 25 fractions/ 2) Right breast boost, 10 Gy in 5 fractions    Past Surgical History  Procedure Laterality Date  . Cesarean section      x 3  . Carpal tunnel release  1998    rt  . Trigger finger release  2004    rt thumb  . Portacath placement  08/2013    still currently active   . Tubal ligation    . Trigger finger release left thumb  06/28/14    at [facility]opedic surgical center  . Wisdom tooth extraction    . Breast surgery  03/01/2014    hx right breast cancer- 3 lymph nodes removed and lumpectomy  . Robotic assisted bilateral salpingo oopherectomy Bilateral 07/09/2014    Procedure: ROBOTIC ASSISTED BILATERAL SALPINGO OOPHORECTOM; :LYSIS OF  ADHESIONS;  Surgeon: Marvene Staff, MD;  Location: [facility];  Service: Gynecology;  Laterality: Bilateral;    There were no vitals filed for this visit.  Visit Diagnosis:  Neck pain  Muscle pain      Subjective Assessment - 04/07/15 1239    Subjective I tried the home unit with my head elevated and worked well.  I feel stiff today. I have just started to swim and my muscles are tight. I want heat and stim today instead of traction.    Pertinent History Breast cancer with radiation and chemotherapy, bulging disc in lumbar, per pt report diagnosed with lupus   How long can you sit comfortably? None   How long can you stand comfortably? None   How long can you walk comfortably? None   Diagnostic tests carpal tunnel in left wrist   Patient Stated Goals turn head without pain, drive without being nervous of getting into an accident   Currently in Pain? Yes   Pain Score 7    Pain Location Neck   Pain Orientation Right;Left   Pain Descriptors / Indicators Tightness   Pain Type Chronic pain   Pain Radiating Towards into left shoulder blade and back of head   Pain Onset More than a month ago   Pain  Frequency Intermittent   Aggravating Factors  turning head   Pain Relieving Factors heat and retraction   Effect of Pain on Daily Activities difficulty with turning head   Multiple Pain Sites No            [facility] PT Assessment - 04/07/15 0001    Assessment   Medical Diagnosis R51 cervicaogenic headach; M54.2 Neck pain; C50.919 breast cancer, unspecified lateralty   Precautions   Precautions Other (comment)  No ultrasound   Precaution Comments No ultrasound   Balance Screen   Has the patient fallen in the past 6 months No   Has the patient had a decrease in activity level because of a fear of falling?  No   Is the patient reluctant to leave their home because of a fear of falling?  No   Prior Function   Level of Independence Independent with basic ADLs   Cognition   Overall  Cognitive Status Within Functional Limits for tasks assessed   Observation/Other Assessments   Focus on Therapeutic Outcomes (FOTO)  51% imitation   Posture/Postural Control   Posture/Postural Control No significant limitations   AROM   AROM Assessment Site Cervical   Cervical - Left Side Bend full   Cervical - Right Rotation decreased by 25%   Cervical - Left Rotation decreased by 25%   Thoracic - Left Rotation full   Strength   Strength Assessment Site Shoulder   Right/Left Shoulder Left                     [facility] Adult PT Treatment/Exercise - 04/07/15 0001    Neck Exercises: Supine   Neck Retraction 10 reps;5 secs  into red physioball   Capital Flexion 10 reps  on red physioball   Cervical Rotation 10 reps;Left;Right  on red phsyioball    Moist Heat Therapy   Number Minutes Moist Heat 15 Minutes   Moist Heat Location Cervical   Electrical Stimulation   Electrical Stimulation Location bil neck and UT  supine   Electrical Stimulation Action IFC   Electrical Stimulation Parameters to patients tolerance   Electrical Stimulation Goals Pain                PT Education - 04/07/15 1258    Education Details reviewed patient past HEP and she is independent   Person(s) Educated Patient   Methods Demonstration   Comprehension Verbalized understanding;Returned demonstration          PT Short Term Goals - 04/07/15 1247    PT SHORT TERM GOAL #1   Title independent with initial HEP   Time 3   Period Weeks   Status Achieved   PT SHORT TERM GOAL #2   Title sleep with pain decreased >/= 25%   Time 3   Period Weeks   Status Achieved   PT SHORT TERM GOAL #3   Title turn head while driving with >/= 57% greater ease   Time 3   Period Weeks   Status Achieved   PT SHORT TERM GOAL #4   Title look down at phone with pain decreased >/= 25%   Time 3   Period Weeks   Status Achieved           PT Long Term Goals - 04/07/15 1246    PT LONG TERM GOAL #1    Title independent with HEP and understand how to progress   Time 6   Period Weeks   Status Achieved  PT LONG TERM GOAL #2   Title full cervical rotation and thoracic rotation so she can turn head with driving without difficulty   Time 6   Period Weeks   Status Not Met  bil. cervical rotation decreased by 25%   PT LONG TERM GOAL #3   Title look down at phone with >/= 75% decrease in pain   Time 6   Period Weeks   Status Achieved   PT LONG TERM GOAL #4   Title sleep with pain decreased >/= 75%   Time 6   Period Weeks   Status Achieved   PT LONG TERM GOAL #5   Title headache less than 1-2 times per week   Time 6   Period Weeks   Status Achieved               Plan - 04/07/15 1259    Clinical Impression Statement Patient is a 54 year old female cancer survivor with neck pain. Patient has met all of her goals.  Patient notices a difference when she is using the home traction unit and understands how to elevate it for further pull.  Patient has full thoracic  ROM and cervical is full with exception of bil. rotation decreased by 25%.  Patient has slight weakness in left shoulder but left elbow and wrist is 5/5.  FOTO  score is 51% limitaiton. Patient is ready to be discharged.    Pt will benefit from skilled therapeutic intervention in order to improve on the following deficits Decreased activity tolerance;Impaired flexibility;Hypomobility;Decreased strength;Decreased range of motion;Decreased endurance;Decreased mobility;Increased muscle spasms;Pain   Rehab Potential Excellent   Clinical Impairments Affecting Rehab Potential None   PT Frequency 2x / week   PT Treatment/Interventions Cryotherapy;Electrical Stimulation;Traction;Moist Heat;Therapeutic activities;Therapeutic exercise;Neuromuscular re-education;Manual techniques;Patient/family education;Passive range of motion   PT Next Visit Plan Discharge to HEP   PT Home Exercise Plan current HEP   Consulted and Agree with Plan  of Care Patient        Problem List Patient Active Problem List   Diagnosis Date Noted  . Cervicogenic headache 02/10/2015  . Neck pain 02/10/2015  . Breast cancer 02/10/2015  . Hot flashes 10/26/2014  . GAD (generalized anxiety disorder) 10/26/2014  . Peripheral neuropathy due to chemotherapy 03/12/2014  . Breast cancer of lower-outer quadrant of right female breast 09/15/2013  . Left shoulder pain - seeing Dr. Lorin Mercy 08/20/2013  . Hypertension     Tenea Sens,PT 04/07/2015, 1:02 PM   [facility] [address] [place], [place], [address] Phone: [phone]   Fax:  [phone]  PHYSICAL THERAPY DISCHARGE SUMMARY  Visits from Start of Care: 11 Current functional level related to goals / functional outcomes: See above.    Remaining deficits: See above   Education / Equipment: HEP  Plan: Patient agrees to discharge.  Patient goals were met. Patient is being discharged due to meeting the stated rehab goals. Thank you for the referral. Earlie Counts, PT 04/07/2015 1:03 PM   ?????

## 2015-04-19 ENCOUNTER — Ambulatory Visit (INDEPENDENT_AMBULATORY_CARE_PROVIDER_SITE_OTHER): Payer: BLUE CROSS/BLUE SHIELD | Admitting: Family Medicine

## 2015-04-19 ENCOUNTER — Encounter: Payer: Self-pay | Admitting: Family Medicine

## 2015-04-19 VITALS — BP 110/84 | HR 89 | Temp 98.5°F | Ht 64.0 in | Wt 174.1 lb

## 2015-04-19 DIAGNOSIS — G622 Polyneuropathy due to other toxic agents: Secondary | ICD-10-CM

## 2015-04-19 DIAGNOSIS — I10 Essential (primary) hypertension: Secondary | ICD-10-CM

## 2015-04-19 DIAGNOSIS — F411 Generalized anxiety disorder: Secondary | ICD-10-CM

## 2015-04-19 DIAGNOSIS — M329 Systemic lupus erythematosus, unspecified: Secondary | ICD-10-CM

## 2015-04-19 DIAGNOSIS — G620 Drug-induced polyneuropathy: Secondary | ICD-10-CM

## 2015-04-19 DIAGNOSIS — K219 Gastro-esophageal reflux disease without esophagitis: Secondary | ICD-10-CM | POA: Diagnosis not present

## 2015-04-19 DIAGNOSIS — T451X5A Adverse effect of antineoplastic and immunosuppressive drugs, initial encounter: Secondary | ICD-10-CM

## 2015-04-19 DIAGNOSIS — IMO0002 Reserved for concepts with insufficient information to code with codable children: Secondary | ICD-10-CM | POA: Insufficient documentation

## 2015-04-19 DIAGNOSIS — C50511 Malignant neoplasm of lower-outer quadrant of right female breast: Secondary | ICD-10-CM

## 2015-04-19 NOTE — Patient Instructions (Signed)
BEFORE YOU LEAVE: -schedule physical in about 4-6 months, come fasting  Get your flu shot (you have opted to do elsewhere)  We recommend the following healthy lifestyle measures: - eat a healthy diet consisting of lots of vegetables, fruits, beans, nuts, seeds, healthy meats such as white chicken and fish and whole grains.  - avoid fried foods, fast food, processed foods, sodas, red meet and other fattening foods.  - get a least 150 minutes of aerobic exercise per week.

## 2015-04-19 NOTE — Progress Notes (Signed)
Pre visit review using our clinic review tool, if applicable. No additional management support is needed unless otherwise documented below in the visit note. 

## 2015-04-19 NOTE — Progress Notes (Signed)
HPI:  HTN: -meds:lisinopril-hctz 20-12.5, asa 81 mg -lifestyle: water aerobics, trying to eat healthy -denies: CP, SOB, DOE  GERD: -meds: omeprazole 40 mg -denies: worsening, dysphagia, stricture in the past -did have esophagitis in the past  Cervicogenic headaches and facial pain/Migraines: -seeing neurology, rheum and ortho for this -MRI head and neck with some DDD -meds: venlafaxine, neuronitin, prn triptan, muscle relaxer -reports: doing a little better  ? Fibromyalgia and Lupus: -seeing Dr. Estanislado Pandy  -meds: plaquenil, neurontin, tizanidine, venlafaxine -reports is feeling better overall  Anxiety/hot flashes: -neurology started venlafaxine for this and her other issues  Hx breast ca: -R breast -sees Oncologist, Dr. Loleta Dicker -s/p lumpectomy, chemo, radiation in 2015, on aromasin     ROS: See pertinent positives and negatives per HPI.  Past Medical History  Diagnosis Date  . Lupus     rheumatologist - Dr. Estanislado Pandy  . Hypertension   . Leukopenia 06/16/2013  . Cancer of right breast 09/03/13    Invasive Ductal Carcinoma/Ductal Carcinoma Insitu  biopsies  . Anxiety   . GERD (gastroesophageal reflux disease)     hx esophagitis  . Degenerative joint disease     back neck and shoulder  . 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 S/P radiation therapy 04/27/2014-06/07/2014    1) Right breast, 50 Gy in 25 fractions/ 2) Right breast boost, 10 Gy in 5 fractions  . Fibromyalgia     Dr. Estanislado Pandy  . Migraines   . Hot flashes     Past Surgical History  Procedure Laterality Date  . Cesarean section      x 3  . Carpal tunnel release  1998    rt  . Trigger finger release  2004    rt thumb  . Portacath placement  08/2013    still currently active   . Tubal ligation    . Trigger finger release left thumb  06/28/14    at orthopedic surgical center  . Wisdom tooth extraction    . Breast surgery  03/01/2014    hx right breast cancer- 3 lymph  nodes removed and lumpectomy  . Robotic assisted bilateral salpingo oopherectomy Bilateral 07/09/2014    Procedure: ROBOTIC ASSISTED BILATERAL SALPINGO OOPHORECTOM; :LYSIS OF ADHESIONS;  Surgeon: Marvene Staff, MD;  Location: [facility];  Service: Gynecology;  Laterality: Bilateral;    Family History  Problem Relation Age of Onset  . Hypertension Mother   . Dementia Mother     small vessel disease  . Cancer Mother 30    cervical  cancer  . Cancer Father 32    prostate ca  . Diabetes Father   . Arthritis Father     Social History   Social History  . Marital Status: Married    Spouse Name: N/A  . Number of Children: 1  . Years of Education: N/A   Occupational History  .      homemaker   Social History Main Topics  . Smoking status: Former Smoker -- 1.00 packs/day for 15 years    Types: Cigars, Cigarettes    Quit date: 02/23/2004  . Smokeless tobacco: Never Used     Comment: remote smoking history  . Alcohol Use: 0.0 oz/week    0 Shots of liquor per week     Comment: occ   . Drug Use: Yes     Comment: Mar[place]ana  . Sexual Activity: Yes   Other Topics Concern  . None   Social History Narrative  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      Home Situation: lives with husband and 78 yo son      Spiritual Beliefs: Christian      Lifestyle: elliptical 3-4 times per week; working on diet as well over last year in 2014              Current outpatient prescriptions:  .  aspirin 81 MG tablet, Take 81 mg by mouth daily., Disp: , Rfl:  .  b complex vitamins capsule, Take 1 capsule by mouth daily., Disp: , Rfl:  .  BIOTIN 5000 PO, Take 1 capsule by mouth daily. , Disp: , Rfl:  .  Calcium Carbonate-Vit D-Min (CALTRATE 600+D PLUS MINERALS PO), Take 1 tablet by mouth daily., Disp: , Rfl:  .  exemestane (AROMASIN) 25 MG tablet, Take 1 tablet (25 mg total) by mouth daily after breakfast., Disp: 90 tablet, Rfl: 3 .  gabapentin (NEURONTIN) 100 MG capsule, Take 3 caps three times a  day, Disp: 270 capsule, Rfl: 6 .  lisinopril-hydrochlorothiazide (PRINZIDE,ZESTORETIC) 20-12.5 MG per tablet, Take 1 tablet by mouth daily., Disp: 90 tablet, Rfl: 3 .  loratadine (CLARITIN) 10 MG tablet, Take 10 mg by mouth daily., Disp: , Rfl:  .  omeprazole (PRILOSEC) 40 MG capsule, TAKE ONE CAPSULE BY MOUTH DAILY, Disp: 30 capsule, Rfl: 2 .  SUMAtriptan (IMITREX) 25 MG tablet, Take 1 tablet (25 mg total) by mouth every 2 (two) hours as needed for migraine. May repeat in 2 hours if needed. Do not take more then 2 tablets in 24 hours., Disp: 10 tablet, Rfl: 0 .  tiZANidine (ZANAFLEX) 4 MG tablet, Take 1 tablet (4 mg total) by mouth every 6 (six) hours as needed for muscle spasms., Disp: 30 tablet, Rfl: 6 .  venlafaxine XR (EFFEXOR-XR) 75 MG 24 hr capsule, TAKE ONE CAPSULE BY MOUTH ONCE DAILY WITH BREAKFAST, Disp: 30 capsule, Rfl: 2 .  VITAMIN E EX, Apply topically. Vaginal suppository, Disp: , Rfl:  .  VITAMIN E PO, Take 1 capsule by mouth daily. , Disp: , Rfl:  No current facility-administered medications for this visit.  Facility-Administered Medications Ordered in Other Visits:  .  prochlorperazine (COMPAZINE) injection 10 mg, 10 mg, Intravenous, Q6H PRN, Minette Headland, NP, 10 mg at 12/26/13 0917  EXAM:  Filed Vitals:   04/19/15 1305  BP: 110/84  Pulse: 89  Temp: 98.5 F (36.9 C)    Body mass index is 29.87 kg/(m^2).  GENERAL: vitals reviewed and listed above, alert, oriented, appears well hydrated and in no acute distress  HEENT: atraumatic, conjunttiva clear, no obvious abnormalities on inspection of external nose and ears  NECK: no obvious masses on inspection  LUNGS: clear to auscultation bilaterally, no wheezes, rales or rhonchi, good air movement  CV: HRRR, no peripheral edema  MS: moves all extremities without noticeable abnormality  PSYCH: pleasant and cooperative, no obvious depression or anxiety  ASSESSMENT AND PLAN:  Discussed the following assessment and  plan:  Essential hypertension  Gastroesophageal reflux disease, esophagitis presence not specified  Lupus  Peripheral neuropathy due to chemotherapy  GAD (generalized anxiety disorder)  Breast cancer of lower-outer quadrant of right female breast  -encouraged and congratulated on healthy lifestyle -mood is great -advised flu shot, she opted to get a cancer center -updated medications -CPE in 4-6 months -Patient advised to return or notify a doctor immediately if symptoms worsen or persist or new concerns arise.  Patient Instructions  BEFORE YOU LEAVE: -schedule physical  in about 4-6 months, come fasting  Get your flu shot (you have opted to do elsewhere)  We recommend the following healthy lifestyle measures: - eat a healthy diet consisting of lots of vegetables, fruits, beans, nuts, seeds, healthy meats such as white chicken and fish and whole grains.  - avoid fried foods, fast food, processed foods, sodas, red meet and other fattening foods.  - get a least 150 minutes of aerobic exercise per week.       Tina Benton R.

## 2015-04-24 NOTE — Assessment & Plan Note (Signed)
Right breast T2N0 IIA invasive ductal carcinoma, grade 3, ER 7% PR 6%, Ki-67 70%, HER-2/neu positive. Status post neoadjuvant [place] for just started 10/02/2013 4 cycles stopped due to neuropathy and fluid retention, gemcitabine carboplatin Herceptin Perjeta 2 with Zoladex to 28 days status post lumpectomy 03/01/2014 residual 0.6 cm tumor grade 3. Completed radiation therapy November 2015  Antiestrogen therapy:  1. Initially given Anastrozole 1 mg daily (Herceptin maintenance completed 10/20/2014) 2. Switch to tamoxifen March 2016 3. Switch to exemestane 01/24/2015 (due to muscle aches, hot flashes)  Tamoxifen and Anastrozole toxicities: 1. Hot flashes: Improved with Effexor 2. Mild fatigue but patient is able to exercise for 45 minutes on cardio 3. Constipation related to calcium supplementation: I recommended Colace and encouraged her to drink more water. 4. Musculoskeletal pain in the right wrist 5. Tingling of the scalp  We will switched her to exemestane  Breast cancer surveillance: 1. Mammogram 09/01/2014 is normal 2. Breast exam 04/24/2015 is normal  Survivorship: Patient has changed her diet and eat more fruits and vegetables and is exercising and staying active. I encouraged her to take turmeric in her diet or as a supplement.   Leukopenia/neutropenia: ANC 1400 I discussed with her that we can watch and monitor this for now.   RTC in 6 months

## 2015-04-25 ENCOUNTER — Ambulatory Visit (HOSPITAL_BASED_OUTPATIENT_CLINIC_OR_DEPARTMENT_OTHER): Payer: BLUE CROSS/BLUE SHIELD | Admitting: Hematology and Oncology

## 2015-04-25 ENCOUNTER — Telehealth: Payer: Self-pay | Admitting: Hematology and Oncology

## 2015-04-25 ENCOUNTER — Encounter: Payer: Self-pay | Admitting: Hematology and Oncology

## 2015-04-25 VITALS — BP 140/84 | HR 88 | Temp 99.3°F | Resp 20 | Ht 64.0 in | Wt 174.7 lb

## 2015-04-25 DIAGNOSIS — Z23 Encounter for immunization: Secondary | ICD-10-CM

## 2015-04-25 DIAGNOSIS — C50511 Malignant neoplasm of lower-outer quadrant of right female breast: Secondary | ICD-10-CM | POA: Diagnosis not present

## 2015-04-25 MED ORDER — INFLUENZA VAC SPLIT QUAD 0.5 ML IM SUSY
0.5000 mL | PREFILLED_SYRINGE | Freq: Once | INTRAMUSCULAR | Status: AC
  Administered 2015-04-25: 0.5 mL via INTRAMUSCULAR
  Filled 2015-04-25: qty 0.5

## 2015-04-25 NOTE — Progress Notes (Signed)
Patient Care Team: Tina Kern, DO as PCP - General (Family Medicine) Tina Conners, MD as Consulting Physician (Emergency Medicine) Tina Lark, MD as Consulting Physician (Hematology and Oncology) Tina Hamman, MD as Consulting Physician (Obstetrics and Gynecology) Tina Merino, MD as Consulting Physician (Rheumatology) Tina Sprang, MD as Consulting Physician (Neurology)  DIAGNOSIS: Breast cancer of lower-outer quadrant of right female breast   Staging form: Breast, AJCC 7th Edition     Clinical: Stage Unknown (T2, NX, cM0) - Signed by Tina Robinson, MD on 09/20/2013     Pathologic stage from 03/01/2014: Stage IA (yT1b, N0, cM0) - Unsigned   SUMMARY OF ONCOLOGIC HISTORY:   Breast cancer of lower-outer quadrant of right female breast   09/03/2013 Initial Biopsy Right breast needle core biopsy at 8 o'clock position: IDC with DCIS, grade 3.  ER+ (weak 7%), PR+ (weak 6%), HER2 positive (ratio 2.22, copy #3.55). Ki67 is 70%.    09/14/2013 Breast MRI lower outer right breast 2.2 x 1.3 x 1.5 cm Irregular mass. In the upper outer left breast there was a 7 x 5 x 8 mm indeterminate enhancing mass. Also another 9 x 7 x 6 mm indeterminant enhancing mass at 9:00 in the left breast was noted   09/15/2013 Clinical Stage T2Nx; Stage IIA, right breast invasive ductal carcinoma, grade 3, ER positive, HER-2/neu positive    09/16/2013 Procedure Left breast needle core biopsy at 2 o'clock position: Benign.  Right axillary LN biopsy: Benign    09/29/2013 Echocardiogram Pre-treatment EF: 65-70%   10/02/2013 - 01/18/2014 Neo-Adjuvant Chemotherapy Docetaxel, Carboplatin, Trastuzumab, Pertuzumab X 4 cycles; Taxotere stopped for fluid retention replaced with Gemcitabine 800 mg/m2 and completed 2 more cycles of chemo;  (TCH-P x 4 cycles), ([facility]-P x 2 cycles); Zolodex Q 4 weeks   11/16/2013 Procedure Genetic testing negative.  Genes tested include BRCA1, BRCA2.    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 Negative margins. 3 right axillary SLN (-). HER2 repeated & negative (ratio 0.71, copy #1.75).   03/01/2014 Pathologic Stage ypT1b, pN0, pMX ; Stage IA   04/27/2014 - 06/07/2014 Radiation Therapy Adjuvant radiation therapy completed Tina [name]). Right breast: total dose 50 Gy over 25 fractions.  Right breast boost: total dose 10 Gy over 5 fractions.    07/06/2014 Echocardiogram Most recent EF: 60-65%. Being followed in [place] clinic.    07/09/2014 Surgery Bilat salpingo-oopherectomy and lysis of adhesions; surgical path benign. Uterus and cervix remain intact.    07/28/2014 - 11/16/2014 Anti-estrogen oral therapy Anastrozole 1 mg daily started.  Planned duration of treatment: 5 years.  Anastrazole stopped 10/2014 d/t pain in hands and feet. Switched to Tamoxifen.     - 10/20/2014 Chemotherapy 1 year maintenance Herceptin completed    11/16/2014 - 01/24/2015 Anti-estrogen oral therapy Tamoxifen 27m daily stopped due to persistent myalgias arthralgias and hot flashes   11/18/2014 Survivorship Survivorship Care Plan given and reviewed with pt in-person.    01/24/2015 -  Anti-estrogen oral therapy Exemestane 25 mg once daily    CHIEF COMPLIANT:  Follow-up on exemestane  INTERVAL HISTORY: Tina [name] a  54year old with above-mentioned history of right breast cancer currently on hormonal therapy with exemestane. We had to switch her from tamoxifen to anastrozole not exemestane. She is tolerating exemestane fairly well. She had muscle skeletal aches and pains and was diagnosed with lupus arthritis. She reports to be otherwise doing well. Hot flashes are  very well controlled with Effexor. She's been trying several new things including swimming lessons.  REVIEW OF SYSTEMS:   Constitutional: Denies fevers, chills or abnormal weight loss Eyes: Denies blurriness of vision Ears, nose, mouth,  throat, and face: Denies mucositis or sore throat Respiratory: Denies cough, dyspnea or wheezes Cardiovascular: Denies palpitation, chest discomfort or lower extremity swelling Gastrointestinal:  Denies nausea, heartburn or change in bowel habits Skin: Denies abnormal skin rashes Lymphatics: Denies new lymphadenopathy or easy bruising Neurological:Denies numbness, tingling or new weaknesses Behavioral/Psych: Mood is stable, no new changes  Breast:   Feeling of discomfort under the right arm and axilla All other systems were reviewed with the patient and are negative.  I have reviewed the past medical history, past surgical history, social history and family history with the patient and they are unchanged from previous note.  ALLERGIES:  is allergic to adhesive; other; and nickel.  MEDICATIONS:  Current Outpatient Prescriptions  Medication Sig Dispense Refill  . aspirin 81 MG tablet Take 81 mg by mouth daily.    Marland Kitchen b complex vitamins capsule Take 1 capsule by mouth daily.    Marland Kitchen BIOTIN 5000 PO Take 1 capsule by mouth daily.     . Calcium Carbonate-Vit D-Min (CALTRATE 600+D PLUS MINERALS PO) Take 1 tablet by mouth daily.    Marland Kitchen exemestane (AROMASIN) 25 MG tablet Take 1 tablet (25 mg total) by mouth daily after breakfast. 90 tablet 3  . gabapentin (NEURONTIN) 100 MG capsule Take 3 caps three times a day 270 capsule 6  . hydroxychloroquine (PLAQUENIL) 200 MG tablet Take 200 mg by mouth daily.    Marland Kitchen lisinopril-hydrochlorothiazide (PRINZIDE,ZESTORETIC) 20-12.5 MG per tablet Take 1 tablet by mouth daily. 90 tablet 3  . loratadine (CLARITIN) 10 MG tablet Take 10 mg by mouth daily.    Marland Kitchen omeprazole (PRILOSEC) 40 MG capsule TAKE ONE CAPSULE BY MOUTH DAILY 30 capsule 2  . SUMAtriptan (IMITREX) 25 MG tablet Take 1 tablet (25 mg total) by mouth every 2 (two) hours as needed for migraine. May repeat in 2 hours if needed. Do not take more then 2 tablets in 24 hours. 10 tablet 0  . tiZANidine (ZANAFLEX) 4 MG  tablet Take 1 tablet (4 mg total) by mouth every 6 (six) hours as needed for muscle spasms. 30 tablet 6  . venlafaxine XR (EFFEXOR-XR) 75 MG 24 hr capsule TAKE ONE CAPSULE BY MOUTH ONCE DAILY WITH BREAKFAST 30 capsule 2  . VITAMIN E EX Apply topically. Vaginal suppository    . VITAMIN E PO Take 1 capsule by mouth daily.      No current facility-administered medications for this visit.   Facility-Administered Medications Ordered in Other Visits  Medication Dose Route Frequency Provider Last Rate Last Dose  . prochlorperazine (COMPAZINE) injection 10 mg  10 mg Intravenous Q6H PRN Minette Headland, NP   10 mg at 12/26/13 9191    PHYSICAL EXAMINATION: ECOG PERFORMANCE STATUS: 1 - Symptomatic but completely ambulatory  Filed Vitals:   04/25/15 1356  BP: 140/84  Pulse: 88  Temp: 99.3 F (37.4 C)  Resp: 20   Filed Weights   04/25/15 1356  Weight: 174 lb 11.2 oz (79.243 kg)    GENERAL:alert, no distress and comfortable SKIN: skin color, texture, turgor are normal, no rashes or significant lesions EYES: normal, Conjunctiva are pink and non-injected, sclera clear OROPHARYNX:no exudate, no erythema and lips, buccal mucosa, and tongue normal  NECK: supple, thyroid normal size, non-tender, without nodularity LYMPH:  no  palpable lymphadenopathy in the cervical, axillary or inguinal LUNGS: clear to auscultation and percussion with normal breathing effort HEART: regular rate & rhythm and no murmurs and no lower extremity edema ABDOMEN:abdomen soft, non-tender and normal bowel sounds Musculoskeletal:no cyanosis of digits and no clubbing  NEURO: alert & oriented x 3 with fluent speech, no focal motor/sensory deficits BREAST: No palpable masses or nodules in either right or left breasts. No palpable axillary supraclavicular or infraclavicular adenopathy no breast tenderness or nipple discharge. (exam performed in the presence of a chaperone)  LABORATORY DATA:  I have reviewed the data as  listed   Chemistry      Component Value Date/Time   NA 141 01/24/2015 1316   NA 138 09/25/2013 1320   K 3.8 01/24/2015 1316   K 3.6* 09/25/2013 1320   CL 98 09/25/2013 1320   CL 104 06/16/2012 1303   CO2 26 01/24/2015 1316   CO2 27 09/25/2013 1320   BUN 12.0 01/24/2015 1316   BUN 9 09/25/2013 1320   CREATININE 0.9 01/24/2015 1316   CREATININE 0.82 09/25/2013 1320      Component Value Date/Time   CALCIUM 8.8 01/24/2015 1316   CALCIUM 10.0 09/25/2013 1320   ALKPHOS 65 01/24/2015 1316   ALKPHOS 42 10/04/2012 1830   AST 22 01/24/2015 1316   AST 32 10/04/2012 1830   ALT 17 01/24/2015 1316   ALT 27 10/04/2012 1830   BILITOT 0.43 01/24/2015 1316   BILITOT 0.7 10/04/2012 1830       Lab Results  Component Value Date   WBC 3.1* 01/24/2015   HGB 12.2 01/24/2015   HCT 35.9 01/24/2015   MCV 91.9 01/24/2015   PLT 238 01/24/2015   NEUTROABS 1.4* 01/24/2015   ASSESSMENT & PLAN:  Breast cancer of lower-outer quadrant of right female breast Right breast T2N0 IIA invasive ductal carcinoma, grade 3, ER 7% PR 6%, Ki-67 70%, HER-2/neu positive. Status post neoadjuvant [place] for just started 10/02/2013 4 cycles stopped due to neuropathy and fluid retention, gemcitabine carboplatin Herceptin Perjeta 2 with Zoladex to 28 days status post lumpectomy 03/01/2014 residual 0.6 cm tumor grade 3. Completed radiation therapy November 2015  Antiestrogen therapy:  1. Initially given Anastrozole 1 mg daily (Herceptin maintenance completed 10/20/2014) 2. Switch to tamoxifen March 2016 3. Switch to exemestane 01/24/2015 (due to muscle aches, hot flashes)  Tamoxifen and Anastrozole toxicities: Hot flashes: Improved with Effexor, Mild fatigue but patient is able to exercise for 45 minutes on cardio, constipation related to calcium supplementation: I recommended Colace and encouraged her to drink more water, Musculoskeletal pain in the right wrist, Tingling of the scalp  Exemestane  Toxicities : 1.  Hot  flashes Patient was diagnosed with lupus and fibromyalgia and is currently taking plaquenil and muscle relaxant. She reports that her musculoskeletal symptoms have not improved significantly.  Breast cancer surveillance: 1. Mammogram 09/01/2014 is normal 2. Breast exam 04/24/2015 is normal  Survivorship: Patient has changed her diet and eat more fruits and vegetables and is exercising and staying active. I encouraged her to take turmeric in her diet or as a supplement.   Leukopenia/neutropenia:  We will recheck her CBC in 6 months.   influenza  Vaccine given today RTC in 6 months   Orders Placed This Encounter  Procedures  . CBC with Differential    Standing Status: Future     Number of Occurrences:      Standing Expiration Date: 04/24/2016  . Comprehensive metabolic panel (Cmet) - [facility]  Standing Status: Future     Number of Occurrences:      Standing Expiration Date: 04/24/2016   The patient has a good understanding of the overall plan. she agrees with it. she will call with any problems that may develop before the next visit here.   Rulon Eisenmenger, MD

## 2015-04-25 NOTE — Telephone Encounter (Signed)
Appointments made and avs pritned for patient °

## 2015-04-26 ENCOUNTER — Other Ambulatory Visit: Payer: Self-pay | Admitting: Hematology and Oncology

## 2015-04-26 ENCOUNTER — Telehealth: Payer: Self-pay | Admitting: Neurology

## 2015-04-26 DIAGNOSIS — C50511 Malignant neoplasm of lower-outer quadrant of right female breast: Secondary | ICD-10-CM

## 2015-04-26 MED ORDER — TRAMADOL HCL 50 MG PO TABS: 50.0000 mg | ORAL_TABLET | Freq: Two times a day (BID) | ORAL | Status: DC | PRN

## 2015-04-26 NOTE — Telephone Encounter (Signed)
Pt called and said she was finished with her Physical Therapy for her neck but was still having neck pain and wanted to know if she could take something for the pain/Dawn CB# [phone]

## 2015-04-26 NOTE — Telephone Encounter (Signed)
I spoke with patient. She states that she hasn't tried Tramadol and would be willing to give it try. I asked her about ortho she did state that she has an orthopedic doctor. I did advise he to follow up with him about her continued issues with her neck. Rx sent to her pharmacy.

## 2015-04-26 NOTE — Telephone Encounter (Signed)
Pls review.

## 2015-04-26 NOTE — Telephone Encounter (Signed)
If she has not tried Tramadol, can do Tramadol 50mg  every 12 hours for pain. Dispense #30 with 3 refills, but pls let her know that at this point I would refer her to Ortho for the neck pain. Thanks

## 2015-04-26 NOTE — Addendum Note (Signed)
Addended by: Thurmon Fair on: 04/26/2015 12:29 PM   Modules accepted: Orders

## 2015-05-13 ENCOUNTER — Encounter: Payer: Self-pay | Admitting: Neurology

## 2015-05-13 ENCOUNTER — Ambulatory Visit (INDEPENDENT_AMBULATORY_CARE_PROVIDER_SITE_OTHER): Payer: BLUE CROSS/BLUE SHIELD | Admitting: Neurology

## 2015-05-13 VITALS — BP 110/78 | HR 84 | Ht 64.0 in | Wt 175.0 lb

## 2015-05-13 DIAGNOSIS — M542 Cervicalgia: Secondary | ICD-10-CM | POA: Diagnosis not present

## 2015-05-13 DIAGNOSIS — R51 Headache: Secondary | ICD-10-CM | POA: Diagnosis not present

## 2015-05-13 DIAGNOSIS — G4486 Cervicogenic headache: Secondary | ICD-10-CM

## 2015-05-13 NOTE — Patient Instructions (Signed)
1. Continue all your current medications 2. Continue home exercise program 3. Follow-up in 6 months, call for any changes

## 2015-05-13 NOTE — Progress Notes (Signed)
NEUROLOGY FOLLOW UP OFFICE NOTE  Tina [name] [id number]  HISTORY OF PRESENT ILLNESS: I had the pleasure of seeing Tina [name] in follow-up in the neurology clinic on 05/13/2015.  The patient was last seen 3 months ago for new onset headaches localized over the right hemisphere, suggestive of cervicogenic headaches. She had increased gabapentin back to 300mg  TID and was referred for physical therapy. She is happy to report that the headaches have disappeared. She has been headache-free for about a month now, she feels that PT was helpful. She did have some neck pain last month, with good response to Tramadol. She denies any side effects on higher dose gabapentin. She continues to have pain in her wrists and has been seeing Rheumatology and started on Plaquenil. She denies any dizziness, diplopia, focal numbness/tingling/weakness, no falls.   HPI: This is a pleasant 54 yo RH woman with a history of breast cancer s/p radiation and chemotherapy, hypertension, polyarthralgia, presenting for evaluation of new onset headaches. She states she has had tension or sinus headaches in the past, but a couple of months ago started having a different kind of headache with pain on the right hemisphere radiating down the right side of her face and down her neck. She sometimes feels the gums on her right side feel sore. She describes a pressure-like pain, with occasional brief sharp, stabbing pains. There is occasional tingling in her scalp on the right side. Headache intensity can go up to a 10/10 where she would have nausea and vomiting, one time it appears she got cold and clammy and possibly briefly passed out. Headaches occur every 2 weeks or so, but would last for several days. She reports that she had a headache daily for 2 weeks until last week. She was feeling better for a week, then again has a mild headache today. This is associated with neck pain, and she reports that shoulder and neck pain have been  bothering her on and off for years, but this is the worst it has been. She has seen her rheumatologist and told she may have fibromyalgia.  She denies any vision changes, no visual obscurations, conjunctival injection. She denies any diplopia, dysarthria, dysphagia, focal weakness, bowel/bladder incontinence. She has been told she has fluid in the right ear, and has occasional high-pitched tinnitus. She has intermittent numbness in her left arm on a daily basis. She has chronic back pain and states Flexeril did not help, but she would use marijuana and this relaxes her and relieves the pain. She had been taking gabapentin 300mg  TID for a year for neuropathy secondary to chemo, and had self-reduced it a couple of months ago to 100mg  TID. She reports taking prn Ativan for nausea. She reports sleep is fine, she had a sleep study negative for sleep apnea. She denies any history of head injuries or recent infections, no family history of headaches.   I personally reviewed MRI brain with and without contrast which was normal, no abnormal enhancement seen. Her MRI C-spine showed degenerative change from C3-4 through C7-T1. At C4-5, there is a central disc herniation that contacts the ventral aspect of the cord but does not frankly compress or show any cord edema. There is mild foraminal narrowing on the left at C5-6 and bilaterally at C6-7, but no definite compression of the exiting nerve roots. C7-T1 shows a shallow broad-based disc herniation but without visible nerve compression.  PAST MEDICAL HISTORY: Past Medical History  Diagnosis Date  . Lupus  rheumatologist - Dr. Estanislado Pandy  . Hypertension   . Leukopenia 06/16/2013  . Cancer of right breast 09/03/13    Invasive Ductal Carcinoma/Ductal Carcinoma Insitu  biopsies  . Anxiety   . GERD (gastroesophageal reflux disease)     hx esophagitis  . Degenerative joint disease     back neck and shoulder  . Neuromuscular disorder     neuropathy from chemo in  feet/hands  . S/P radiation therapy 04/27/2014-06/07/2014    1) Right breast, 50 Gy in 25 fractions/ 2) Right breast boost, 10 Gy in 5 fractions  . Fibromyalgia     Dr. Estanislado Pandy  . Migraines   . Hot flashes     MEDICATIONS: Current Outpatient Prescriptions on File Prior to Visit  Medication Sig Dispense Refill  . aspirin 81 MG tablet Take 81 mg by mouth daily.    Marland Kitchen b complex vitamins capsule Take 1 capsule by mouth daily.    Marland Kitchen BIOTIN 5000 PO Take 1 capsule by mouth daily.     . Calcium Carbonate-Vit D-Min (CALTRATE 600+D PLUS MINERALS PO) Take 1 tablet by mouth daily.    Marland Kitchen exemestane (AROMASIN) 25 MG tablet Take 1 tablet (25 mg total) by mouth daily after breakfast. 90 tablet 3  . gabapentin (NEURONTIN) 100 MG capsule Take 3 caps three times a day 270 capsule 6  . hydroxychloroquine (PLAQUENIL) 200 MG tablet Take 200 mg by mouth daily.    Marland Kitchen lisinopril-hydrochlorothiazide (PRINZIDE,ZESTORETIC) 20-12.5 MG per tablet Take 1 tablet by mouth daily. 90 tablet 3  . omeprazole (PRILOSEC) 40 MG capsule TAKE ONE CAPSULE BY MOUTH ONCE DAILY 30 capsule 4  . SUMAtriptan (IMITREX) 25 MG tablet Take 1 tablet (25 mg total) by mouth every 2 (two) hours as needed for migraine. May repeat in 2 hours if needed. Do not take more then 2 tablets in 24 hours. 10 tablet 0  . tiZANidine (ZANAFLEX) 4 MG tablet Take 1 tablet (4 mg total) by mouth every 6 (six) hours as needed for muscle spasms. 30 tablet 6  . traMADol (ULTRAM) 50 MG tablet Take 1 tablet (50 mg total) by mouth every 12 (twelve) hours as needed. 30 tablet 0  . venlafaxine XR (EFFEXOR-XR) 75 MG 24 hr capsule TAKE ONE CAPSULE BY MOUTH ONCE DAILY WITH BREAKFAST 30 capsule 2  . VITAMIN E EX Apply topically. Vaginal suppository    . loratadine (CLARITIN) 10 MG tablet Take 10 mg by mouth daily.     Current Facility-Administered Medications on File Prior to Visit  Medication Dose Route Frequency Provider Last Rate Last Dose  . prochlorperazine (COMPAZINE)  injection 10 mg  10 mg Intravenous Q6H PRN Minette Headland, NP   10 mg at 12/26/13 3244    ALLERGIES: Allergies  Allergen Reactions  . Adhesive [Tape] Itching    Burned and scarred skinned  . Other     Cholroprep-causes rash  . Nickel Rash    FAMILY HISTORY: Family History  Problem Relation Age of Onset  . Hypertension Mother   . Dementia Mother     small vessel disease  . Cancer Mother 30    cervical  cancer  . Cancer Father 23    prostate ca  . Diabetes Father   . Arthritis Father     SOCIAL HISTORY: Social History   Social History  . Marital Status: Married    Spouse Name: N/A  . Number of Children: 1  . Years of Education: N/A   Occupational History  .  homemaker   Social History Main Topics  . Smoking status: Former Smoker -- 1.00 packs/day for 15 years    Types: Cigars, Cigarettes    Quit date: 02/23/2004  . Smokeless tobacco: Never Used     Comment: remote smoking history  . Alcohol Use: 0.0 oz/week    0 Shots of liquor per week     Comment: occ   . Drug Use: Yes     Comment: Marjuana  . Sexual Activity: Yes   Other Topics Concern  . 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      Home Situation: lives with husband and 66 yo son      Spiritual Beliefs: Christian      Lifestyle: elliptical 3-4 times per week; working on diet as well over last year in 2014             REVIEW OF SYSTEMS: Constitutional: No fevers, chills, or sweats, no generalized fatigue, change in appetite Eyes: No visual changes, double vision, eye pain Ear, nose and throat: No hearing loss, ear pain, nasal congestion, sore throat Cardiovascular: No chest pain, palpitations Respiratory:  No shortness of breath at rest or with exertion, wheezes GastrointestinaI: No nausea, vomiting, diarrhea, abdominal pain, fecal incontinence Genitourinary:  No dysuria, urinary retention or frequency Musculoskeletal:  + neck pain, back  pain Integumentary: No rash, pruritus, skin lesions Neurological: as above Psychiatric: No depression, insomnia, anxiety Endocrine: No palpitations, fatigue, diaphoresis, mood swings, change in appetite, change in weight, increased thirst Hematologic/Lymphatic:  No anemia, purpura, petechiae. Allergic/Immunologic: no itchy/runny eyes, nasal congestion, recent allergic reactions, rashes  PHYSICAL EXAM: Filed Vitals:   05/13/15 1523  BP: 110/78  Pulse: 84   General: No acute distress Head:  Normocephalic/atraumatic Neck: supple, no paraspinal tenderness, full range of motion Heart:  Regular rate and rhythm Lungs:  Clear to auscultation bilaterally Back: No paraspinal tenderness Skin/Extremities: No rash, no edema Neurological Exam: alert and oriented to person, place, and time. No aphasia or dysarthria. Fund of knowledge is appropriate.  Recent and remote memory are intact.  Attention and concentration are normal.    Able to name objects and repeat phrases. Cranial nerves: Pupils equal, round, reactive to light.  Fundoscopic exam unremarkable, no papilledema. Extraocular movements intact with no nystagmus. Visual fields full. Facial sensation intact. No facial asymmetry. Tongue, uvula, palate midline.  Motor: Bulk and tone normal, muscle strength 5/5 throughout with no pronator drift.  Sensation to light touch intact.  No extinction to double simultaneous stimulation.  Deep tendon reflexes 2+ throughout, toes downgoing.  Finger to nose testing intact.  Gait narrow-based and steady, able to tandem walk adequately.  Romberg negative.  IMPRESSION: This is a pleasant 54 yo RH woman with a history of f breast cancer s/p radiation and chemotherapy, hypertension, polyarthralgia, who presented with new onset headaches localized over the right side, with significant neck pain, suggestive of cervicogenic headaches. Her neurological exam and MRI brain are normal. She has had excellent response to PT and  increased gabapentin dose 300mg  TID with no side effects, no further headaches. Continue same dose of gabapentin for now. She will follow-up in 6 months and knows to call our office for any changes.  Thank you for allowing me to participate in her care.  Please do not hesitate to call for any questions or concerns.  The duration of this appointment visit was 15 minutes of face-to-face time with the patient.  Greater than 50% of this time was spent in counseling, explanation of diagnosis, planning of further management, and coordination of care.   Ellouise Newer, M.D.   CC: Dr. Maudie Mercury

## 2015-05-16 DIAGNOSIS — Z0279 Encounter for issue of other medical certificate: Secondary | ICD-10-CM

## 2015-05-26 ENCOUNTER — Ambulatory Visit: Payer: BLUE CROSS/BLUE SHIELD | Admitting: Family Medicine

## 2015-06-17 ENCOUNTER — Ambulatory Visit (INDEPENDENT_AMBULATORY_CARE_PROVIDER_SITE_OTHER): Payer: BLUE CROSS/BLUE SHIELD | Admitting: Psychology

## 2015-06-17 DIAGNOSIS — F4322 Adjustment disorder with anxiety: Secondary | ICD-10-CM

## 2015-06-30 ENCOUNTER — Other Ambulatory Visit: Payer: Self-pay | Admitting: Family Medicine

## 2015-07-01 ENCOUNTER — Ambulatory Visit (INDEPENDENT_AMBULATORY_CARE_PROVIDER_SITE_OTHER): Payer: BLUE CROSS/BLUE SHIELD | Admitting: Psychology

## 2015-07-01 ENCOUNTER — Ambulatory Visit (INDEPENDENT_AMBULATORY_CARE_PROVIDER_SITE_OTHER): Payer: BLUE CROSS/BLUE SHIELD | Admitting: Family Medicine

## 2015-07-01 ENCOUNTER — Encounter: Payer: Self-pay | Admitting: Family Medicine

## 2015-07-01 VITALS — BP 128/86 | HR 80 | Temp 99.0°F | Ht 64.0 in | Wt 176.8 lb

## 2015-07-01 DIAGNOSIS — M25531 Pain in right wrist: Secondary | ICD-10-CM

## 2015-07-01 DIAGNOSIS — R52 Pain, unspecified: Secondary | ICD-10-CM

## 2015-07-01 DIAGNOSIS — I10 Essential (primary) hypertension: Secondary | ICD-10-CM | POA: Diagnosis not present

## 2015-07-01 DIAGNOSIS — R0989 Other specified symptoms and signs involving the circulatory and respiratory systems: Secondary | ICD-10-CM

## 2015-07-01 DIAGNOSIS — C50511 Malignant neoplasm of lower-outer quadrant of right female breast: Secondary | ICD-10-CM | POA: Diagnosis not present

## 2015-07-01 DIAGNOSIS — F4322 Adjustment disorder with anxiety: Secondary | ICD-10-CM | POA: Diagnosis not present

## 2015-07-01 DIAGNOSIS — M25532 Pain in left wrist: Secondary | ICD-10-CM

## 2015-07-01 NOTE — Progress Notes (Signed)
HPI:  Pain in veins: -started 2 months ago after swimming -feels like veins in R UE are sore, thinks they were swollen at one point -no swelling, redness, fevers, malaise, heaviness currently  Chronic wrist pain: -> 1 year -has seen hand specialist - has CTS, has tried various medications and braces, nothing helps, does not want to do surgery -no weakness or numbness  Hypertension: -BP elevated on arrival, normal on recheck  ROS: See pertinent positives and negatives per HPI.  Past Medical History  Diagnosis Date  . Lupus [facility])     rheumatologist - Dr. Estanislado Pandy  . Hypertension   . Leukopenia 06/16/2013  . Cancer of right breast ([place]) 09/03/13    Invasive Ductal Carcinoma/Ductal Carcinoma Insitu  biopsies  . Anxiety   . GERD (gastroesophageal reflux disease)     hx esophagitis  . Degenerative joint disease     back neck and shoulder  . Neuromuscular disorder ([place])     neuropathy from chemo in feet/hands  . S/P radiation therapy 04/27/2014-06/07/2014    1) Right breast, 50 Gy in 25 fractions/ 2) Right breast boost, 10 Gy in 5 fractions  . Fibromyalgia     Dr. Estanislado Pandy  . Migraines   . Hot flashes     Past Surgical History  Procedure Laterality Date  . Cesarean section      x 3  . Carpal tunnel release  1998    rt  . Trigger finger release  2004    rt thumb  . Portacath placement  08/2013    still currently active   . Tubal ligation    . Trigger finger release left thumb  06/28/14    [facility] surgical center  . Wisdom tooth extraction    . Breast surgery  03/01/2014    hx right breast cancer- 3 lymph nodes removed and lumpectomy  . Robotic assisted bilateral salpingo oopherectomy Bilateral 07/09/2014    Procedure: ROBOTIC ASSISTED BILATERAL SALPINGO OOPHORECTOM; :LYSIS OF ADHESIONS;  Surgeon: Marvene Staff, MD;  Location: [facility];  Service: Gynecology;  Laterality: Bilateral;    Family History  Problem Relation Age of Onset  . Hypertension Mother    . Dementia Mother     small vessel disease  . Cancer Mother 30    cervical  cancer  . Cancer Father 46    prostate ca  . Diabetes Father   . Arthritis Father     Social History   Social History  . Marital Status: Married    Spouse Name: N/A  . Number of Children: 1  . Years of Education: N/A   Occupational History  .      homemaker   Social History Main Topics  . Smoking status: Former Smoker -- 1.00 packs/day for 15 years    Types: Cigars, Cigarettes    Quit date: 02/23/2004  . Smokeless tobacco: Never Used     Comment: remote smoking history  . Alcohol Use: 0.0 oz/week    0 Shots of liquor per week     Comment: occ   . Drug Use: Yes     Comment: Marjuana  . Sexual Activity: Yes   Other Topics Concern  . 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  aspirin 81 MG tablet, Take 81 mg by mouth daily., Disp: , Rfl:  .  b complex vitamins capsule, Take 1 capsule by mouth daily., Disp: , Rfl:  .  BIOTIN 5000 PO, Take 1 capsule by mouth daily. , Disp: , Rfl:  .  Calcium Carbonate-Vit D-Min (CALTRATE 600+D PLUS MINERALS PO), Take 1 tablet by mouth daily., Disp: , Rfl:  .  exemestane (AROMASIN) 25 MG tablet, Take 1 tablet (25 mg total) by mouth daily after breakfast., Disp: 90 tablet, Rfl: 3 .  gabapentin (NEURONTIN) 100 MG capsule, Take 3 caps three times a day, Disp: 270 capsule, Rfl: 6 .  hydroxychloroquine (PLAQUENIL) 200 MG tablet, Take 200 mg by mouth daily., Disp: , Rfl:  .  lisinopril-hydrochlorothiazide (PRINZIDE,ZESTORETIC) 20-12.5 MG per tablet, Take 1 tablet by mouth daily., Disp: 90 tablet, Rfl: 3 .  loratadine (CLARITIN) 10 MG tablet, Take 10 mg by mouth daily., Disp: , Rfl:  .  MAGNESIUM PO, Take by  mouth daily., Disp: , Rfl:  .  omeprazole (PRILOSEC) 40 MG capsule, TAKE ONE CAPSULE BY MOUTH ONCE DAILY, Disp: 30 capsule, Rfl: 4 .  SUMAtriptan (IMITREX) 25 MG tablet, Take 1 tablet (25 mg total) by mouth every 2 (two) hours as needed for migraine. May repeat in 2 hours if needed. Do not take more then 2 tablets in 24 hours., Disp: 10 tablet, Rfl: 0 .  tiZANidine (ZANAFLEX) 4 MG tablet, Take 1 tablet (4 mg total) by mouth every 6 (six) hours as needed for muscle spasms., Disp: 30 tablet, Rfl: 6 .  traMADol (ULTRAM) 50 MG tablet, Take 1 tablet (50 mg total) by mouth every 12 (twelve) hours as needed., Disp: 30 tablet, Rfl: 0 .  TURMERIC PO, Take by mouth daily., Disp: , Rfl:  .  venlafaxine XR (EFFEXOR-XR) 75 MG 24 hr capsule, TAKE ONE CAPSULE BY MOUTH ONCE DAILY WITH BREAKFAST, Disp: 30 capsule, Rfl: 5 .  VITAMIN E EX, Apply topically. Vaginal suppository, Disp: , Rfl:  No current facility-administered medications for this visit.  Facility-Administered Medications Ordered in Other Visits:  .  prochlorperazine (COMPAZINE) injection 10 mg, 10 mg, Intravenous, Q6H PRN, Minette Headland, NP, 10 mg at 12/26/13 0917  EXAM:  Filed Vitals:   07/01/15 1428  BP: 128/86  Pulse: 80  Temp: 99 F (37.2 C)    Body mass index is 30.33 kg/(m^2).  GENERAL: vitals reviewed and listed above, alert, oriented, appears well hydrated and in no acute distress  HEENT: atraumatic, conjunttiva clear, no obvious abnormalities on inspection of external nose and ears  NECK: no obvious masses on inspection  LUNGS: clear to auscultation bilaterally, no wheezes, rales or rhonchi, good air movement  CV: HRRR, no peripheral edema - no abnormalities of veins appreciated in R UE, no redness or edema of extremity  MS: moves all extremities without noticeable abnormality, no swelling or redness of wrist - normal strength and cap refill hand bilat   PSYCH: pleasant and cooperative, no obvious depression or  anxiety  ASSESSMENT AND PLAN:  Discussed the following assessment and plan:  Painful veins - Plan: VAS [place] UPPER EXTREMITY ARTERIAL DUPLEX Breast cancer of lower-outer quadrant of right female breast ([place]) -she is very anxious about thrombophlebitis of DVT, on signs of this on exam and she reports her oncologist also told her no signs of this, she  wants [place] to eval  Pain in both wrists -sees specialist, reports nothing helps and wants to try alternative treatment such as massage, acupunture, essential oils - advised I am not an expert in any of the therapies - she may try and provided her with names of providers  Essential hypertension BP good on recheck  -Patient advised to return or notify a doctor immediately if symptoms worsen or persist or new concerns arise.  Patient Instructions  -We placed a referral for you as discussed. It usually takes about 1-2 weeks to process and schedule this referral. If you have not heard from [place] regarding this appointment in 2 weeks please contact our office.       Colin Benton R.

## 2015-07-01 NOTE — Patient Instructions (Signed)
-  We placed a referral for you as discussed. It usually takes about 1-2 weeks to process and schedule this referral. If you have not heard from us regarding this appointment in 2 weeks please contact our office.  

## 2015-07-01 NOTE — Progress Notes (Signed)
Pre visit review using our clinic review tool, if applicable. No additional management support is needed unless otherwise documented below in the visit note. 

## 2015-07-05 ENCOUNTER — Other Ambulatory Visit: Payer: Self-pay | Admitting: Family Medicine

## 2015-07-05 ENCOUNTER — Ambulatory Visit (HOSPITAL_COMMUNITY)
Admission: RE | Admit: 2015-07-05 | Discharge: 2015-07-05 | Disposition: A | Discharge status: Home / Self Care | Payer: BLUE CROSS/BLUE SHIELD | Source: Ambulatory Visit | Attending: Cardiology | Admitting: Cardiology

## 2015-07-05 DIAGNOSIS — R0989 Other specified symptoms and signs involving the circulatory and respiratory systems: Secondary | ICD-10-CM | POA: Insufficient documentation

## 2015-07-05 DIAGNOSIS — I10 Essential (primary) hypertension: Secondary | ICD-10-CM | POA: Insufficient documentation

## 2015-07-05 DIAGNOSIS — M79601 Pain in right arm: Secondary | ICD-10-CM

## 2015-07-05 DIAGNOSIS — R52 Pain, unspecified: Secondary | ICD-10-CM

## 2015-07-15 ENCOUNTER — Ambulatory Visit (INDEPENDENT_AMBULATORY_CARE_PROVIDER_SITE_OTHER): Payer: BLUE CROSS/BLUE SHIELD | Admitting: Psychology

## 2015-07-15 DIAGNOSIS — F4322 Adjustment disorder with anxiety: Secondary | ICD-10-CM

## 2015-08-02 ENCOUNTER — Other Ambulatory Visit: Payer: Self-pay | Admitting: Hematology and Oncology

## 2015-08-02 ENCOUNTER — Ambulatory Visit (INDEPENDENT_AMBULATORY_CARE_PROVIDER_SITE_OTHER): Payer: BLUE CROSS/BLUE SHIELD | Admitting: Psychology

## 2015-08-02 DIAGNOSIS — F4322 Adjustment disorder with anxiety: Secondary | ICD-10-CM

## 2015-08-02 DIAGNOSIS — Z853 Personal history of malignant neoplasm of breast: Secondary | ICD-10-CM

## 2015-08-02 DIAGNOSIS — Z9889 Other specified postprocedural states: Secondary | ICD-10-CM

## 2015-08-03 ENCOUNTER — Ambulatory Visit: Payer: BLUE CROSS/BLUE SHIELD | Attending: Rheumatology | Admitting: Physical Therapy

## 2015-08-03 DIAGNOSIS — M542 Cervicalgia: Secondary | ICD-10-CM | POA: Diagnosis not present

## 2015-08-03 NOTE — Therapy (Signed)
[place], [place], [address] Phone: [phone]   Fax:  [phone]  Physical Therapy Evaluation/ Discharge Summary  Patient Details  Name: Tina [name] MRN: [id number] Date of Birth: 07/10/1961 Referring Provider: Brayton Caves, MD  Encounter Date: 08/03/2015      PT End of Session - 08/03/15 1236    Visit Number 1   Number of Visits 1   Date for PT Re-Evaluation 08/03/15   PT Start Time 1146   PT Stop Time 1228   PT Time Calculation (min) 42 min   Activity Tolerance Patient tolerated treatment well   Behavior During Therapy [facility] for tasks assessed/performed      Past Medical History  Diagnosis Date  . Lupus [facility])     rheumatologist - Dr. Estanislado Pandy  . Hypertension   . Leukopenia 06/16/2013  . Cancer of right breast ([place]) 09/03/13    Invasive Ductal Carcinoma/Ductal Carcinoma Insitu  biopsies  . Anxiety   . GERD (gastroesophageal reflux disease)     hx esophagitis  . Degenerative joint disease     back neck and shoulder  . Neuromuscular disorder ([place])     neuropathy from chemo in feet/hands  . S/P radiation therapy 04/27/2014-06/07/2014    1) Right breast, 50 Gy in 25 fractions/ 2) Right breast boost, 10 Gy in 5 fractions  . Fibromyalgia     Dr. Estanislado Pandy  . Migraines   . Hot flashes     Past Surgical History  Procedure Laterality Date  . Cesarean section      x 3  . Carpal tunnel release  1998    rt  . Trigger finger release  2004    rt thumb  . Portacath placement  08/2013    still currently active   . Tubal ligation    . Trigger finger release left thumb  06/28/14    at orthopedic surgical center  . Wisdom tooth extraction    . Breast surgery  03/01/2014    hx right breast cancer- 3 lymph nodes removed and lumpectomy  . Robotic assisted bilateral salpingo oopherectomy Bilateral 07/09/2014    Procedure: ROBOTIC ASSISTED BILATERAL SALPINGO OOPHORECTOM; :LYSIS OF ADHESIONS;   Surgeon: Marvene Staff, MD;  Location: [facility];  Service: Gynecology;  Laterality: Bilateral;    There were no vitals filed for this visit.  Visit Diagnosis:  Neck pain - Plan: PT plan of care cert/re-cert      Subjective Assessment - 08/03/15 1152    Subjective Patient is reporting multiple years history of ongoing neck pain. States that she is not exactly sure what started the pain. In the past she has use e-stim with physical therapy and that was beneficial.   How long can you sit comfortably? 15 minutes   How long can you stand comfortably? 15 minutes   How long can you walk comfortably? 30 minutes   Patient Stated Goals Get the TENS unit to help decrease the pain.    Currently in Pain? Yes   Pain Score 6    Pain Location Neck   Pain Orientation Left;Mid;Lower   Pain Descriptors / Indicators Tightness   Pain Type Chronic pain   Pain Radiating Towards occasionally Lt arm goes to sleep.    Pain Onset More than a month ago   Pain Frequency Constant   Aggravating Factors  stress, turning head   Pain Relieving Factors heat, e-stim, using tennis balls  [facility] PT Assessment - 08/03/15 0001    Assessment   Medical Diagnosis neck pain   Referring Provider Brayton Caves, MD   Onset Date/Surgical Date --  years ago   Next MD Visit March 2017   Prior Therapy yes   Balance Screen   Has the patient fallen in the past 6 months No   Has the patient had a decrease in activity level because of a fear of falling?  No   Is the patient reluctant to leave their home because of a fear of falling?  No   Home Ecologist residence   Living Arrangements Spouse/significant other   Prior Function   Level of Independence Independent   Cognition   Overall Cognitive Status Within Functional Limits for tasks assessed   Observation/Other Assessments   Observations forward head, rounded shoulders   AROM   Overall AROM Comments WFL cervical ROM.  Discomfort at endrange sidebending, and rotation.    Strength   Strength Assessment Site Other (comment)  cervical myotomes grossly 5/5                           PT Education - 08/03/15 1235    Education provided Yes   Education Details TENS precautions, setup, and use of device.    Person(s) Educated Patient   Methods Explanation;Demonstration   Comprehension Verbalized understanding;Returned demonstration  Patient denies any questions or concerns following session             PT Long Term Goals - 08/03/15 1248    Additional Long Term Goals   Additional Long Term Goals Yes   PT LONG TERM GOAL #1   Title Patient is to be independent with use of TENS device and safety considerations.    Status Achieved               Plan - 08/03/15 1240    Clinical Impression Statement Patient is a 55 y.o. female who reports chronic neck pain. She has now been referred to OPPT services for TENS unit evaluation and dispersement. By the end of the session the patient was able to demonstrate independent use of the TENS unit and was educated on precautions, safe use of the device including skin care and application options. Following the session the patient states that she feels confident with use of the device and denied any questions or concerns.  The complexity of the evaluation was considered low based upon standard criteria.    Rehab Potential Good   PT Frequency One time visit   PT Treatment/Interventions Electrical Stimulation   PT Next Visit Plan no plan, single visit   PT Home Exercise Plan N/A   Consulted and Agree with Plan of Care Patient         Problem List Patient Active Problem List   Diagnosis Date Noted  . Lupus ([place]) 04/19/2015  . GERD (gastroesophageal reflux disease) 04/19/2015  . Cervicogenic headache 02/10/2015  . Neck pain 02/10/2015  . Hot flashes 10/26/2014  . GAD (generalized anxiety disorder) 10/26/2014  . Peripheral neuropathy due  to chemotherapy ([place]) 03/12/2014  . Breast cancer of lower-outer quadrant of right female breast ([place]) 09/15/2013  . Left shoulder pain - seeing Dr. Lorin Mercy 08/20/2013  . Hypertension     Linard Millers, PT 08/03/2015, 12:58 PM  [facility] [address] [place], [place], [address] Phone: [phone]   Fax:  [phone]  Name: Tina [name] MRN: [id number] Date of Birth: 1961-05-16

## 2015-08-10 ENCOUNTER — Telehealth: Payer: Self-pay | Admitting: Family Medicine

## 2015-08-10 NOTE — Telephone Encounter (Signed)
[facility]  Patient Name: Tina [name]  DOB: 12/13/1960    Initial Comment Caller States she has blue streaks in her mucus and right ear is a bit stopped up.    Nurse Assessment  Nurse: Harlow Mares, RN, Suanne Marker Date/Time (Eastern Time): 08/10/2015 3:40:26 PM  Confirm and document reason for call. If symptomatic, describe symptoms. ---Caller States she has blue streaks in her mucus with sinus rinses (clear saline rinse) and right ear is a bit stopped up. Reports that she has had congestion in the ear. No headache. Doesn't use any other nasal spray. No cold symptoms. No wheezing but states she is not breathing as deep as she usually does. No chest discomfort.  Has the patient traveled out of the country within the last 30 days? ---Not Applicable  Does the patient have any new or worsening symptoms? ---Yes  Will a triage be completed? ---Yes  Related visit to physician within the last 2 weeks? ---No  Does the PT have any chronic conditions? (i.e. diabetes, asthma, etc.) ---Yes  List chronic conditions. ---lupus, hypertension, estrogen blocker due to cancer, port removal 6 mo ago.  Did the patient indicate they were pregnant? ---No  Is this a behavioral health or substance abuse call? ---No     Guidelines    Guideline Title Affirmed Question Affirmed Notes  Hearing Loss or Change [1] Decreased hearing in 1 ear AND [2] gradual onset    Final Disposition User   See PCP When Office is Open (within 3 days) Harlow Mares, RN, Suanne Marker    Comments  Advised caller that nurse has no idea what the blue mucous could be coming from but to discuss with MD at tomorrow's visit. Appt made with Dr. Maudie Mercury for 08/11/15 @ 11:15 am [place] location. Caller voiced understanding.   Referrals  REFERRED TO PCP OFFICE   Disagree/Comply: Comply

## 2015-08-11 ENCOUNTER — Ambulatory Visit: Payer: Self-pay | Admitting: Family Medicine

## 2015-08-19 ENCOUNTER — Ambulatory Visit (INDEPENDENT_AMBULATORY_CARE_PROVIDER_SITE_OTHER): Payer: BLUE CROSS/BLUE SHIELD | Admitting: Psychology

## 2015-08-19 DIAGNOSIS — F4322 Adjustment disorder with anxiety: Secondary | ICD-10-CM

## 2015-08-22 ENCOUNTER — Encounter: Payer: Self-pay | Admitting: Family Medicine

## 2015-08-22 ENCOUNTER — Ambulatory Visit (INDEPENDENT_AMBULATORY_CARE_PROVIDER_SITE_OTHER): Payer: BLUE CROSS/BLUE SHIELD | Admitting: Family Medicine

## 2015-08-22 VITALS — BP 130/90 | HR 92 | Temp 98.2°F | Ht 64.0 in | Wt 181.1 lb

## 2015-08-22 DIAGNOSIS — J019 Acute sinusitis, unspecified: Secondary | ICD-10-CM

## 2015-08-22 DIAGNOSIS — I10 Essential (primary) hypertension: Secondary | ICD-10-CM | POA: Diagnosis not present

## 2015-08-22 DIAGNOSIS — H6983 Other specified disorders of Eustachian tube, bilateral: Secondary | ICD-10-CM

## 2015-08-22 MED ORDER — AMOXICILLIN-POT CLAVULANATE 875-125 MG PO TABS: 1.0000 | ORAL_TABLET | Freq: Two times a day (BID) | ORAL | Status: DC

## 2015-08-22 NOTE — Progress Notes (Signed)
HPI:  Tina [name] is a very pleasant 55 yo with a PMH significant for lupus and fibromyalgia (sees rheumatologist), anxiety, migraines/cervicogenic headaches (sees neurologist and sports medicine), HTN, GERD, neuropathy and breast Ca here for and acute visit for blue sinus congestion and head feels full. Reports: since multiple family members sick with cold she has been cleaning mucus with saline and for several weeks has noticed blue (no red, green, yellow or brown) streaks on occ in mucus from nose, sinus pressure and today feels like equilibrium is a little off, mucus in throat, sore throat -denies: fevers, chills, HA, SOB, DOE, sinus pain  ROS: See pertinent positives and negatives per HPI.  Past Medical History  Diagnosis Date  . Lupus [facility])     rheumatologist - Dr. Estanislado Pandy  . Hypertension   . Leukopenia 06/16/2013  . Cancer of right breast ([place]) 09/03/13    Invasive Ductal Carcinoma/Ductal Carcinoma Insitu  biopsies  . Anxiety   . GERD (gastroesophageal reflux disease)     hx esophagitis  . Degenerative joint disease     back neck and shoulder  . 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 S/P radiation therapy 04/27/2014-06/07/2014    1) Right breast, 50 Gy in 25 fractions/ 2) Right breast boost, 10 Gy in 5 fractions  . Fibromyalgia     Dr. Estanislado Pandy  . Migraines   . Hot flashes     Past Surgical History  Procedure Laterality Date  . Cesarean section      x 3  . Carpal tunnel release  1998    rt  . Trigger finger release  2004    rt thumb  . Portacath placement  08/2013    still currently active   . Tubal ligation    . Trigger finger release left thumb  06/28/14    at orthopedic surgical center  . Wisdom tooth extraction    . Breast surgery  03/01/2014    hx right breast cancer- 3 lymph nodes removed and lumpectomy  . Robotic assisted bilateral salpingo oopherectomy Bilateral 07/09/2014    Procedure: ROBOTIC ASSISTED BILATERAL  SALPINGO OOPHORECTOM; :LYSIS OF ADHESIONS;  Surgeon: Marvene Staff, MD;  Location: [facility];  Service: Gynecology;  Laterality: Bilateral;    Family History  Problem Relation Age of Onset  . Hypertension Mother   . Dementia Mother     small vessel disease  . Cancer Mother 30    cervical  cancer  . Cancer Father 22    prostate ca  . Diabetes Father   . Arthritis Father     Social History   Social History  . Marital Status: Married    Spouse Name: N/A  . Number of Children: 1  . Years of Education: N/A   Occupational History  .      homemaker   Social History Main Topics  . Smoking status: Former Smoker -- 1.00 packs/day for 15 years    Types: Cigars, Cigarettes    Quit date: 02/23/2004  . Smokeless tobacco: Never Used     Comment: remote smoking history  . Alcohol Use: 0.0 oz/week    0 Shots of liquor per week     Comment: occ   . Drug Use: Yes     Comment: [place]  . Sexual Activity: Yes   Other Topics Concern  . 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  Home Situation: lives with husband and 76 yo son      Spiritual Beliefs: Christian      Lifestyle: elliptical 3-4 times per week; working on diet as well over last year in 2014              Current outpatient prescriptions:  .  aspirin 81 MG tablet, Take 81 mg by mouth daily., Disp: , Rfl:  .  b complex vitamins capsule, Take 1 capsule by mouth daily., Disp: , Rfl:  .  BIOTIN 5000 PO, Take 1 capsule by mouth daily. , Disp: , Rfl:  .  Calcium Carbonate-Vit D-Min (CALTRATE 600+D PLUS MINERALS PO), Take 1 tablet by mouth daily., Disp: , Rfl:  .  exemestane (AROMASIN) 25 MG tablet, Take 1 tablet (25 mg total) by mouth daily after breakfast., Disp: 90 tablet, Rfl: 3 .  gabapentin (NEURONTIN) 100 MG capsule, Take 3 caps three times a day, Disp: 270 capsule, Rfl: 6 .  hydroxychloroquine (PLAQUENIL) 200 MG tablet, Take 200 mg by mouth daily., Disp: , Rfl:  .   lisinopril-hydrochlorothiazide (PRINZIDE,ZESTORETIC) 20-12.5 MG per tablet, Take 1 tablet by mouth daily., Disp: 90 tablet, Rfl: 3 .  loratadine (CLARITIN) 10 MG tablet, Take 10 mg by mouth daily., Disp: , Rfl:  .  MAGNESIUM PO, Take by mouth daily., Disp: , Rfl:  .  omeprazole (PRILOSEC) 40 MG capsule, TAKE ONE CAPSULE BY MOUTH ONCE DAILY, Disp: 30 capsule, Rfl: 4 .  SUMAtriptan (IMITREX) 25 MG tablet, Take 1 tablet (25 mg total) by mouth every 2 (two) hours as needed for migraine. May repeat in 2 hours if needed. Do not take more then 2 tablets in 24 hours., Disp: 10 tablet, Rfl: 0 .  tiZANidine (ZANAFLEX) 4 MG tablet, Take 1 tablet (4 mg total) by mouth every 6 (six) hours as needed for muscle spasms., Disp: 30 tablet, Rfl: 6 .  traMADol (ULTRAM) 50 MG tablet, Take 1 tablet (50 mg total) by mouth every 12 (twelve) hours as needed., Disp: 30 tablet, Rfl: 0 .  TURMERIC PO, Take by mouth daily., Disp: , Rfl:  .  venlafaxine XR (EFFEXOR-XR) 75 MG 24 hr capsule, TAKE ONE CAPSULE BY MOUTH ONCE DAILY WITH BREAKFAST, Disp: 30 capsule, Rfl: 5 .  VITAMIN E EX, Apply topically. Vaginal suppository, Disp: , Rfl:  .  amoxicillin-clavulanate (AUGMENTIN) 875-125 MG tablet, Take 1 tablet by mouth 2 (two) times daily., Disp: 14 tablet, Rfl: 0 No current facility-administered medications for this visit.  Facility-Administered Medications Ordered in Other Visits:  .  prochlorperazine (COMPAZINE) injection 10 mg, 10 mg, Intravenous, Q6H PRN, Minette Headland, NP, 10 mg at 12/26/13 0917  EXAM:  Filed Vitals:   08/22/15 1344  BP: 128/90  Pulse: 92  Temp: 98.2 F (36.8 C)    Body mass index is 31.07 kg/(m^2).  GENERAL: vitals reviewed and listed above, alert, oriented, appears well hydrated and in no acute distress  HEENT: atraumatic, conjunttiva clear, no obvious abnormalities on inspection of external nose and ears, normal appearance of ear canals and TMs except for bilat clear effusions, clear nasal  congestion, mild post oropharyngeal erythema with PND, no tonsillar edema or exudate, no sinus TTP  NECK: no obvious masses on inspection  LUNGS: clear to auscultation bilaterally, no wheezes, rales or rhonchi, good air movement  CV: HRRR, no peripheral edema  MS: moves all extremities without noticeable abnormality  PSYCH: pleasant and cooperative, no obvious depression or anxiety  ASSESSMENT AND PLAN:  Discussed the  following assessment and plan:  Acute sinusitis, recurrence not specified, unspecified location ETD (eustachian tube dysfunction), bilateral -query sinusitis, tx with abx, short course afrin, INS -advised to stop the saline as I am not sure what the blue streaks are from and advise ENT eval if persists  Essential hypertension -ok on recheck  -Patient advised to return or notify a doctor immediately if symptoms worsen or persist or new concerns arise.  Patient Instructions  BEFORE YOU LEAVE: -recheck blood pressure  Use the antibiotic as instructed (Augmentin)  Use afrin nasal spray for 3 days then STOP  Use flonase 2 sprays each nostril for 21 days  If persistent symptoms see the Ear, Nose and throat doctor  See care immediately if worsening or new concerns.     Colin Benton R.

## 2015-08-22 NOTE — Progress Notes (Signed)
Pre visit review using our clinic review tool, if applicable. No additional management support is needed unless otherwise documented below in the visit note. 

## 2015-08-22 NOTE — Patient Instructions (Addendum)
BEFORE YOU LEAVE: -recheck blood pressure  Use the antibiotic as instructed (Augmentin)  Use afrin nasal spray for 3 days then STOP  Use flonase 2 sprays each nostril for 21 days  If persistent symptoms see the Ear, Nose and throat doctor  See care immediately if worsening or new concerns.

## 2015-08-31 ENCOUNTER — Encounter: Payer: Self-pay | Admitting: Adult Health

## 2015-08-31 NOTE — Progress Notes (Signed)
A birthday card was mailed to the patient today on behalf of the Survivorship Program at [facility].   Kelsee Preslar, NP Survivorship Program [facility] [phone]  

## 2015-09-01 ENCOUNTER — Other Ambulatory Visit: Payer: Self-pay | Admitting: *Deleted

## 2015-09-01 MED ORDER — LISINOPRIL-HYDROCHLOROTHIAZIDE 20-12.5 MG PO TABS: 1.0000 | ORAL_TABLET | Freq: Every day | ORAL | Status: DC

## 2015-09-01 MED ORDER — VENLAFAXINE HCL ER 75 MG PO CP24: ORAL_CAPSULE | ORAL | Status: DC

## 2015-09-02 ENCOUNTER — Other Ambulatory Visit: Payer: Self-pay

## 2015-09-02 ENCOUNTER — Ambulatory Visit: Payer: BLUE CROSS/BLUE SHIELD | Admitting: Psychology

## 2015-09-02 DIAGNOSIS — C50511 Malignant neoplasm of lower-outer quadrant of right female breast: Secondary | ICD-10-CM

## 2015-09-02 MED ORDER — EXEMESTANE 25 MG PO TABS: 25.0000 mg | ORAL_TABLET | Freq: Every day | ORAL | Status: DC

## 2015-09-02 MED ORDER — OMEPRAZOLE 40 MG PO CPDR: 40.0000 mg | DELAYED_RELEASE_CAPSULE | Freq: Every day | ORAL | Status: DC

## 2015-09-05 ENCOUNTER — Ambulatory Visit
Admission: RE | Admit: 2015-09-05 | Discharge: 2015-09-05 | Disposition: A | Discharge status: Home / Self Care | Payer: BLUE CROSS/BLUE SHIELD | Source: Ambulatory Visit | Attending: Hematology and Oncology | Admitting: Hematology and Oncology

## 2015-09-05 DIAGNOSIS — Z853 Personal history of malignant neoplasm of breast: Secondary | ICD-10-CM

## 2015-09-05 DIAGNOSIS — Z9889 Other specified postprocedural states: Secondary | ICD-10-CM

## 2015-09-08 ENCOUNTER — Telehealth: Payer: Self-pay | Admitting: *Deleted

## 2015-09-08 ENCOUNTER — Telehealth: Payer: Self-pay | Admitting: Family Medicine

## 2015-09-08 NOTE — Telephone Encounter (Signed)
Pt needs a lot of dental work done, due to recent dx of lupus. Pt's dentist she prefers is not in her network. Her insurance HR department advised pt to contact Dr Maudie Mercury for help. Pt needs documentation from dr kim if possible how this issue is effecting her health.  Then perhaps her medical insurance will help pay for the issues with her mouth.  Marland Kitchen

## 2015-09-08 NOTE — Telephone Encounter (Signed)
Her dental issues are not likely caused by any issues I am treating her for (HTN, GERD, etc). Please advise she contact her insurance for a list of in network dentists to assist her and ask her dentist for recommendations as I am not sure how I could be of assistance.  Thanks.

## 2015-09-08 NOTE — Telephone Encounter (Signed)
"  I need records to have dental work done.  The dentist is out of network.  Need records because I am still having problems with mouth sores.  Information should be in my records because in 2015 when treated,  I was prescribed MMW and sitz baths because it went through my esophagus down.  I need my records before Monday.  Return number [phone]."    Will notify H.I.M. And advised patient to come in with picture ID to sign for records between 8:00 am and 5:00 pm.

## 2015-09-09 ENCOUNTER — Telehealth: Payer: Self-pay | Admitting: Hematology and Oncology

## 2015-09-09 NOTE — Telephone Encounter (Signed)
Patient informed of the message below and states she contacted the physician that treats her lupus and they agreed to write a letter for her.

## 2015-09-09 NOTE — Telephone Encounter (Signed)
PATIENT CALLED REQUESTING MEDICAL RECORDS FROM 2015

## 2015-10-07 ENCOUNTER — Ambulatory Visit (INDEPENDENT_AMBULATORY_CARE_PROVIDER_SITE_OTHER): Payer: BLUE CROSS/BLUE SHIELD | Admitting: Psychology

## 2015-10-07 DIAGNOSIS — F4322 Adjustment disorder with anxiety: Secondary | ICD-10-CM | POA: Diagnosis not present

## 2015-10-17 ENCOUNTER — Telehealth: Payer: Self-pay | Admitting: Neurology

## 2015-10-17 DIAGNOSIS — R51 Headache: Secondary | ICD-10-CM

## 2015-10-17 DIAGNOSIS — G4486 Cervicogenic headache: Secondary | ICD-10-CM

## 2015-10-17 DIAGNOSIS — C50919 Malignant neoplasm of unspecified site of unspecified female breast: Secondary | ICD-10-CM

## 2015-10-17 MED ORDER — GABAPENTIN 100 MG PO CAPS: ORAL_CAPSULE | ORAL | Status: DC

## 2015-10-17 NOTE — Telephone Encounter (Signed)
Patient wasn't Gabapentin sent to [facility].

## 2015-10-17 NOTE — Telephone Encounter (Signed)
Patient called needing her  prescription faxed to  [phone].  Her call back number is [phone].

## 2015-10-18 ENCOUNTER — Encounter: Payer: BLUE CROSS/BLUE SHIELD | Admitting: Family Medicine

## 2015-10-20 ENCOUNTER — Ambulatory Visit (INDEPENDENT_AMBULATORY_CARE_PROVIDER_SITE_OTHER): Payer: BLUE CROSS/BLUE SHIELD | Admitting: Psychology

## 2015-10-20 DIAGNOSIS — F4322 Adjustment disorder with anxiety: Secondary | ICD-10-CM

## 2015-10-24 ENCOUNTER — Other Ambulatory Visit (HOSPITAL_BASED_OUTPATIENT_CLINIC_OR_DEPARTMENT_OTHER): Payer: BLUE CROSS/BLUE SHIELD

## 2015-10-24 ENCOUNTER — Encounter: Payer: Self-pay | Admitting: Hematology and Oncology

## 2015-10-24 ENCOUNTER — Ambulatory Visit (HOSPITAL_BASED_OUTPATIENT_CLINIC_OR_DEPARTMENT_OTHER): Payer: BLUE CROSS/BLUE SHIELD | Admitting: Hematology and Oncology

## 2015-10-24 ENCOUNTER — Telehealth: Payer: Self-pay | Admitting: Hematology and Oncology

## 2015-10-24 VITALS — BP 141/90 | HR 71 | Temp 97.7°F | Resp 18 | Wt 183.1 lb

## 2015-10-24 DIAGNOSIS — C50511 Malignant neoplasm of lower-outer quadrant of right female breast: Secondary | ICD-10-CM

## 2015-10-24 DIAGNOSIS — M359 Systemic involvement of connective tissue, unspecified: Secondary | ICD-10-CM | POA: Diagnosis not present

## 2015-10-24 LAB — COMPREHENSIVE METABOLIC PANEL
ALT: 25 U/L (ref 0–55)
AST: 31 U/L (ref 5–34)
Albumin: 3.9 g/dL (ref 3.5–5.0)
Alkaline Phosphatase: 59 U/L (ref 40–150)
Anion Gap: 8 mEq/L (ref 3–11)
BUN: 11.5 mg/dL (ref 7.0–26.0)
CHLORIDE: 104 meq/L (ref 98–109)
CO2: 29 meq/L (ref 22–29)
CREATININE: 0.9 mg/dL (ref 0.6–1.1)
Calcium: 9.6 mg/dL (ref 8.4–10.4)
EGFR: 80 mL/min/{1.73_m2} — ABNORMAL LOW (ref >90)
GLUCOSE: 75 mg/dL (ref 70–140)
POTASSIUM: 3.4 meq/L — AB (ref 3.5–5.1)
SODIUM: 141 meq/L (ref 136–145)
Total Bilirubin: 0.75 mg/dL (ref 0.20–1.20)
Total Protein: 8 g/dL (ref 6.4–8.3)

## 2015-10-24 LAB — CBC WITH DIFFERENTIAL/PLATELET
BASO%: 0.9 % (ref 0.0–2.0)
Basophils Absolute: 0 10*3/uL (ref 0.0–0.1)
EOS%: 1.3 % (ref 0.0–7.0)
Eosinophils Absolute: 0.1 10*3/uL (ref 0.0–0.5)
HCT: 40.7 % (ref 34.8–46.6)
HGB: 13.8 g/dL (ref 11.6–15.9)
LYMPH%: 55.2 % — ABNORMAL HIGH (ref 14.0–49.7)
MCH: 30.8 pg (ref 25.1–34.0)
MCHC: 33.8 g/dL (ref 31.5–36.0)
MCV: 91 fL (ref 79.5–101.0)
MONO#: 0.3 10*3/uL (ref 0.1–0.9)
MONO%: 6.9 % (ref 0.0–14.0)
NEUT#: 1.5 10*3/uL (ref 1.5–6.5)
NEUT%: 35.7 % — AB (ref 38.4–76.8)
Platelets: 255 10*3/uL (ref 145–400)
RBC: 4.47 10*6/uL (ref 3.70–5.45)
RDW: 12.3 % (ref 11.2–14.5)
WBC: 4.1 10*3/uL (ref 3.9–10.3)
lymph#: 2.3 10*3/uL (ref 0.9–3.3)

## 2015-10-24 MED ORDER — TAMOXIFEN CITRATE 20 MG PO TABS: 20.0000 mg | ORAL_TABLET | Freq: Every day | ORAL | Status: DC

## 2015-10-24 NOTE — Telephone Encounter (Signed)
Gave and printed appt sched and avs for pt for Sept °

## 2015-10-24 NOTE — Progress Notes (Signed)
Unable to get in to exam room prior to MD.  No assessment performed.  

## 2015-10-24 NOTE — Progress Notes (Signed)
Patient Care Team: [name], DO as PCP - General (Family Medicine) [name], MD as Consulting Physician (Emergency Medicine) [name], MD as Consulting Physician (Hematology and Oncology) [name], MD as Consulting Physician (Obstetrics and Gynecology) [name], MD as Consulting Physician (Rheumatology) [name], MD as Consulting Physician (Neurology)  DIAGNOSIS: Breast cancer of lower-outer quadrant of right female breast [facility])   Staging form: Breast, AJCC 7th Edition     Clinical: Stage Unknown (T2, NX, cM0) - Signed by [name], MD on 09/20/2013     Pathologic stage from 03/01/2014: Stage IA (yT1b, N0, cM0) - Unsigned   SUMMARY OF ONCOLOGIC HISTORY:   Breast cancer of lower-outer quadrant of right female breast ([facility])   09/03/2013 Initial Biopsy Right breast needle core biopsy at 8 o'clock position: IDC with DCIS, grade 3.  ER+ (weak 7%), PR+ (weak 6%), HER2 positive (ratio 2.22, copy #3.55). Ki67 is 70%.    09/14/2013 Breast MRI lower outer right breast 2.2 x 1.3 x 1.5 cm Irregular mass. In the upper outer left breast there was a 7 x 5 x 8 mm indeterminate enhancing mass. Also another 9 x 7 x 6 mm indeterminant enhancing mass at 9:00 in the left breast was noted   09/15/2013 Clinical Stage T2Nx; Stage IIA, right breast invasive ductal carcinoma, grade 3, ER positive, HER-2/neu positive    09/16/2013 Procedure Left breast needle core biopsy at 2 o'clock position: Benign.  Right axillary LN biopsy: Benign    09/29/2013 Echocardiogram Pre-treatment EF: 65-70%   10/02/2013 - 01/18/2014 Neo-Adjuvant Chemotherapy Docetaxel, Carboplatin, Trastuzumab, Pertuzumab X 4 cycles; Taxotere stopped for fluid retention replaced with Gemcitabine 800 mg/m2 and completed 2 more cycles of chemo;  (TCH-P x 4 cycles), (GCH-P x 2 cycles); Zolodex Q 4 weeks   11/16/2013 Procedure Genetic testing negative.  Genes tested include BRCA1, BRCA2.    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 Negative margins. 3 right axillary SLN (-). HER2 repeated & negative (ratio 0.71, copy #1.75).   03/01/2014 Pathologic Stage ypT1b, pN0, pMX ; Stage IA   04/27/2014 - 06/07/2014 Radiation Therapy Adjuvant radiation therapy completed [name]). Right breast: total dose 50 Gy over 25 fractions.  Right breast boost: total dose 10 Gy over 5 fractions.    07/06/2014 Echocardiogram Most recent EF: 60-65%. Being followed in [facility].    07/09/2014 Surgery Bilat salpingo-oopherectomy and lysis of adhesions; surgical path benign. Uterus and cervix remain intact.    07/28/2014 - 11/16/2014 Anti-estrogen oral therapy Anastrozole 1 mg daily started.  Planned duration of treatment: 5 years.  Anastrazole stopped 10/2014 d/t pain in hands and feet. Switched to Tamoxifen.     - 10/20/2014 Chemotherapy 1 year maintenance Herceptin completed    11/16/2014 - 01/24/2015 Anti-estrogen oral therapy Tamoxifen 22m daily stopped due to persistent myalgias arthralgias and hot flashes   11/18/2014 Survivorship Survivorship Care Plan given and reviewed with pt in-person.    01/24/2015 -  Anti-estrogen oral therapy Exemestane 25 mg once daily, switched to tamoxifen 20 mg daily 10/24/2015    CHIEF COMPLIANT: follow-up on exemestane  INTERVAL HISTORY: FAdryan Druckenmilleris a 55year old with above-mentioned history of right breast cancer currently on exemestane adjuvant antiestrogen therapy. She continues to have musculoskeletal pains. She now understands that these symptoms are related to lupus and autoimmune disease. It is not related to antiestrogen treatment. She tells me that overall she  felt the tamoxifen was the best treatment for her with least amount of vaginal dryness issues.she continues to have musculoskeletal aches and pains especially in the right wrist.  REVIEW OF SYSTEMS:     Constitutional: Denies fevers, chills or abnormal weight loss Eyes: Denies blurriness of vision Ears, nose, mouth, throat, and face: Denies mucositis or sore throat Respiratory: Denies cough, dyspnea or wheezes Cardiovascular: Denies palpitation, chest discomfort Gastrointestinal:  Denies nausea, heartburn or change in bowel habits Skin: Denies abnormal skin rashes Lymphatics: Denies new lymphadenopathy or easy bruising Neurological:Denies numbness, tingling or new weaknesses Behavioral/Psych: Mood is stable, no new changes  Extremities: No lower extremity edema Breast:  denies any pain or lumps or nodules in either breasts All other systems were reviewed with the patient and are negative.  I have reviewed the past medical history, past surgical history, social history and family history with the patient and they are unchanged from previous note.  ALLERGIES:  is allergic to adhesive; other; and nickel.  MEDICATIONS:  Current Outpatient Prescriptions  Medication Sig Dispense Refill  . amoxicillin-clavulanate (AUGMENTIN) 875-125 MG tablet Take 1 tablet by mouth 2 (two) times daily. 14 tablet 0  . aspirin 81 MG tablet Take 81 mg by mouth daily.    Marland Kitchen b complex vitamins capsule Take 1 capsule by mouth daily.    Marland Kitchen BIOTIN 5000 PO Take 1 capsule by mouth daily.     . Calcium Carbonate-Vit D-Min (CALTRATE 600+D PLUS MINERALS PO) Take 1 tablet by mouth daily.    Marland Kitchen gabapentin (NEURONTIN) 100 MG capsule Take 3 caps three times a day 810 capsule 1  . hydroxychloroquine (PLAQUENIL) 200 MG tablet Take 200 mg by mouth daily.    Marland Kitchen lisinopril-hydrochlorothiazide (PRINZIDE,ZESTORETIC) 20-12.5 MG tablet Take 1 tablet by mouth daily. 90 tablet 0  . loratadine (CLARITIN) 10 MG tablet Take 10 mg by mouth daily.    Marland Kitchen MAGNESIUM PO Take by mouth daily.    Marland Kitchen omeprazole (PRILOSEC) 40 MG capsule Take 1 capsule (40 mg total) by mouth daily. 30 capsule 4  . SUMAtriptan (IMITREX) 25 MG tablet Take 1 tablet (25 mg  total) by mouth every 2 (two) hours as needed for migraine. May repeat in 2 hours if needed. Do not take more then 2 tablets in 24 hours. 10 tablet 0  . tamoxifen (NOLVADEX) 20 MG tablet Take 1 tablet (20 mg total) by mouth daily. 90 tablet 3  . tiZANidine (ZANAFLEX) 4 MG tablet Take 1 tablet (4 mg total) by mouth every 6 (six) hours as needed for muscle spasms. 30 tablet 6  . traMADol (ULTRAM) 50 MG tablet Take 1 tablet (50 mg total) by mouth every 12 (twelve) hours as needed. 30 tablet 0  . TURMERIC PO Take by mouth daily.    Marland Kitchen venlafaxine XR (EFFEXOR-XR) 75 MG 24 hr capsule TAKE ONE CAPSULE BY MOUTH ONCE DAILY WITH BREAKFAST 90 capsule 0  . VITAMIN E EX Apply topically. Vaginal suppository     No current facility-administered medications for this visit.   Facility-Administered Medications Ordered in Other Visits  Medication Dose Route Frequency Provider Last Rate Last Dose  . prochlorperazine (COMPAZINE) injection 10 mg  10 mg Intravenous Q6H PRN Minette Headland, NP   10 mg at 12/26/13 6270    PHYSICAL EXAMINATION: ECOG PERFORMANCE STATUS: 1 - Symptomatic but completely ambulatory  Filed Vitals:   10/24/15 1547  BP: 141/90  Pulse: 71  Temp: 97.7 F (36.5 C)  Resp: 18   Filed  Weights   10/24/15 1547  Weight: 183 lb 1.6 oz (83.054 kg)    GENERAL:alert, no distress and comfortable SKIN: skin color, texture, turgor are normal, no rashes or significant lesions EYES: normal, Conjunctiva are pink and non-injected, sclera clear OROPHARYNX:no exudate, no erythema and lips, buccal mucosa, and tongue normal  NECK: supple, thyroid normal size, non-tender, without nodularity LYMPH:  no palpable lymphadenopathy in the cervical, axillary or inguinal LUNGS: clear to auscultation and percussion with normal breathing effort HEART: regular rate & rhythm and no murmurs and no lower extremity edema ABDOMEN:abdomen soft, non-tender and normal bowel sounds MUSCULOSKELETAL:no cyanosis of digits  and no clubbing  NEURO: alert & oriented x 3 with fluent speech, no focal motor/sensory deficits EXTREMITIES: No lower extremity edema BREAST: No palpable masses or nodules in either right or left breasts. No palpable axillary supraclavicular or infraclavicular adenopathy no breast tenderness or nipple discharge. (exam performed in the presence of a chaperone)  LABORATORY DATA:  I have reviewed the data as listed   Chemistry      Component Value Date/Time   NA 141 10/24/2015 1523   NA 138 09/25/2013 1320   K 3.4* 10/24/2015 1523   K 3.6* 09/25/2013 1320   CL 98 09/25/2013 1320   CL 104 06/16/2012 1303   CO2 29 10/24/2015 1523   CO2 27 09/25/2013 1320   BUN 11.5 10/24/2015 1523   BUN 9 09/25/2013 1320   CREATININE 0.9 10/24/2015 1523   CREATININE 0.82 09/25/2013 1320      Component Value Date/Time   CALCIUM 9.6 10/24/2015 1523   CALCIUM 10.0 09/25/2013 1320   ALKPHOS 59 10/24/2015 1523   ALKPHOS 42 10/04/2012 1830   AST 31 10/24/2015 1523   AST 32 10/04/2012 1830   ALT 25 10/24/2015 1523   ALT 27 10/04/2012 1830   BILITOT 0.75 10/24/2015 1523   BILITOT 0.7 10/04/2012 1830       Lab Results  Component Value Date   WBC 4.1 10/24/2015   HGB 13.8 10/24/2015   HCT 40.7 10/24/2015   MCV 91.0 10/24/2015   PLT 255 10/24/2015   NEUTROABS 1.5 10/24/2015     ASSESSMENT & PLAN:  Breast cancer of lower-outer quadrant of right female breast Right breast T2N0 IIA invasive ductal carcinoma, grade 3, ER 7% PR 6%, Ki-67 70%, HER-2/neu positive. Status post neoadjuvant [place] for just started 10/02/2013 4 cycles stopped due to neuropathy and fluid retention, gemcitabine carboplatin Herceptin Perjeta 2 with Zoladex to 28 days status post lumpectomy 03/01/2014 residual 0.6 cm tumor grade 3. Completed radiation therapy November 2015  Antiestrogen therapy:  1. Initially given Anastrozole 1 mg daily (Herceptin maintenance completed 10/20/2014) 2. Switch to tamoxifen March 2016 3.  Switch to exemestane 01/24/2015 (due to muscle aches, hot flashes) 4. Switched back to tamoxifen 10/24/2015 ( due to less side effect comparatively to AI therapy)  Tamoxifen exemestane and Anastrozole toxicities: Hot flashes: Improved with Effexor,  Exemestane Toxicities :Patient was diagnosed with lupus and fibromyalgia and is currently taking plaquenil and muscle relaxant. She reports that her musculoskeletal symptoms have not improved significantly.  Breast cancer surveillance: 1. Mammogram 09/05/2015 is normal 2. Breast exam 10/24/2015 is normal  Survivorship: Patient has changed her diet and eat more fruits and vegetables and is exercising and staying active. I encouraged her to take turmeric in her diet or as a supplement.   Leukopenia/neutropenia: We will recheck her CBC in 6 months. [place] is [address]   Orders Placed This Encounter  Procedures  .  CBC with Differential    Standing Status: Future     Number of Occurrences:      Standing Expiration Date: 10/23/2016  . Comprehensive metabolic panel    Standing Status: Future     Number of Occurrences:      Standing Expiration Date: 10/23/2016   The patient has a good understanding of the overall plan. she agrees with it. she will call with any problems that may develop before the next visit here.   Rulon Eisenmenger, MD 10/24/2015

## 2015-10-24 NOTE — Assessment & Plan Note (Signed)
Right breast T2N0 IIA invasive ductal carcinoma, grade 3, ER 7% PR 6%, Ki-67 70%, HER-2/neu positive. Status post neoadjuvant [place] for just started 10/02/2013 4 cycles stopped due to neuropathy and fluid retention, gemcitabine carboplatin Herceptin Perjeta 2 with Zoladex to 28 days status post lumpectomy 03/01/2014 residual 0.6 cm tumor grade 3. Completed radiation therapy November 2015  Antiestrogen therapy:  1. Initially given Anastrozole 1 mg daily (Herceptin maintenance completed 10/20/2014) 2. Switch to tamoxifen March 2016 3. Switch to exemestane 01/24/2015 (due to muscle aches, hot flashes)  Tamoxifen and Anastrozole toxicities: Hot flashes: Improved with Effexor, Mild fatigue but patient is able to exercise for 45 minutes on cardio, constipation related to calcium supplementation: I recommended Colace and encouraged her to drink more water, Musculoskeletal pain in the right wrist, Tingling of the scalp  Exemestane Toxicities : 1. Hot flashes Patient was diagnosed with lupus and fibromyalgia and is currently taking plaquenil and muscle relaxant. She reports that her musculoskeletal symptoms have not improved significantly.  Breast cancer surveillance: 1. Mammogram 09/01/2014 is normal 2. Breast exam 04/24/2015 is normal  Survivorship: Patient has changed her diet and eat more fruits and vegetables and is exercising and staying active. I encouraged her to take turmeric in her diet or as a supplement.   Leukopenia/neutropenia: We will recheck her CBC in 6 months.  influenza Vaccine given today RTC in 1 year

## 2015-10-25 ENCOUNTER — Ambulatory Visit (INDEPENDENT_AMBULATORY_CARE_PROVIDER_SITE_OTHER): Payer: BLUE CROSS/BLUE SHIELD | Admitting: Family Medicine

## 2015-10-25 ENCOUNTER — Encounter: Payer: Self-pay | Admitting: Family Medicine

## 2015-10-25 VITALS — BP 140/90 | HR 81 | Temp 98.7°F | Ht 64.0 in | Wt 180.6 lb

## 2015-10-25 DIAGNOSIS — K219 Gastro-esophageal reflux disease without esophagitis: Secondary | ICD-10-CM

## 2015-10-25 DIAGNOSIS — I10 Essential (primary) hypertension: Secondary | ICD-10-CM | POA: Diagnosis not present

## 2015-10-25 DIAGNOSIS — Z0000 Encounter for general adult medical examination without abnormal findings: Secondary | ICD-10-CM | POA: Diagnosis not present

## 2015-10-25 NOTE — Addendum Note (Signed)
Addended by: Gari Crown D on: 10/25/2015 12:59 PM   Modules accepted: Orders

## 2015-10-25 NOTE — Progress Notes (Signed)
HPI:  Tina [name] is a very pleasant 55 yo with a PMH significant for lupus and fibromyalgia (sees rheumatologist), anxiety, migraines/cervicogenic headaches (sees neurologist and sports medicine), HTN, GERD, neuropathy and breast Ca (managed by Dr. Lindi Adie) here for her annual visit. She has a form to complete for physical.  HTN: -meds:lisinopril-hctz 20-12.5, asa 81 mg - did not take bp med this morning -lifestyle: water aerobics, trying to eat healthy -denies: CP, SOB, DOE  GERD: -meds: omeprazole 40 mg -denies: worsening, dysphagia, stricture in the past -did have esophagitis in the past  Cervicogenic headaches and facial pain/Migraines: -seeing neurology, rheum and ortho for this -MRI head and neck with some DDD -meds: venlafaxine, neuronitin, prn triptan, tramadol muscle relaxer -reports: doing a little better  ? Fibromyalgia and Lupus: -seeing Dr. Estanislado Pandy  -meds: plaquenil, neurontin, tizanidine, venlafaxine -reports is feeling better overall  Anxiety/hot flashes: -neurology started venlafaxine for this and her other issues  Hx breast ca: -R breast -sees Oncologist, Dr. Loleta Dicker -s/p lumpectomy, chemo, radiation in 2015, on aromasin in the past, now changing to tamoxifen  -Diet: variety of foods, balance and well rounded, larger portion sizes - bad snacking habits  -Exercise:regular exercise  -Diabetes and Dyslipidemia Screening: needs for form, fasting  -Vaccines: UTD  -pap history: UTD, does with Dr. Garwin Brothers  -wants STI testing (Hep C if born 35-65): no  -FH breast, colon or ovarian ca: see FH Last mammogram: done Last colon cancer screening: considering options, anxious about colonoscopy and was holding off due to cancer treatments and blood counts  -Alcohol, Tobacco, drug use: see social history  Review of Systems - no fevers, unintentional weight loss, vision loss, hearing loss, chest pain, sob, hemoptysis, melena, hematochezia, hematuria, genital  discharge, changing or concerning skin lesions, bleeding, bruising, loc, thoughts of self harm or SI  Past Medical History  Diagnosis Date  . Lupus [facility])     rheumatologist - Dr. Estanislado Pandy  . Hypertension   . Leukopenia 06/16/2013  . Cancer of right breast ([place]) 09/03/13    Invasive Ductal Carcinoma/Ductal Carcinoma Ins[facility]  biopsies  . Anxiety   . GERD (gastroesophageal reflux disease)     hx esophagitis  . Degenerative joint disease     back neck and shoulder  . Neuromuscular disorder ([place])     neuropathy from chemo in feet/hands  . S/P radiation therapy 04/27/2014-06/07/2014    1) Right breast, 50 Gy in 25 fractions/ 2) Right breast boost, 10 Gy in 5 fractions  . Fibromyalgia     Dr. Estanislado Pandy  . Migraines   . Hot flashes     Past Surgical History  Procedure Laterality Date  . Cesarean section      x 3  . Carpal tunnel release  1998    rt  . Trigger finger release  2004    rt thumb  . Portacath placement  08/2013    still currently active   . Tubal ligation    . Trigger finger release left thumb  06/28/14    at orthopedic surgical center  . Wisdom tooth extraction    . Breast surgery  03/01/2014    hx right breast cancer- 3 lymph nodes removed and lumpectomy  . Robotic assisted bilateral salpingo oopherectomy Bilateral 07/09/2014    Procedure: ROBOTIC ASSISTED BILATERAL SALPINGO OOPHORECTOM; :LYSIS OF ADHESIONS;  Surgeon: Marvene Staff, MD;  Location: [facility];  Service: Gynecology;  Laterality: Bilateral;    Family History  Problem Relation Age of Onset  . Hypertension  Mother   . Dementia Mother     small vessel disease  . Cancer Mother 30    cervical  cancer  . Cancer Father 62    prostate ca  . Diabetes Father   . Arthritis Father     Social History   Social History  . Marital Status: Married    Spouse Name: N/A  . Number of Children: 1  . Years of Education: N/A   Occupational History  .      homemaker   Social History Main Topics  . Smoking  status: Former Smoker -- 1.00 packs/day for 15 years    Types: Cigars, Cigarettes    Quit date: 02/23/2004  . Smokeless tobacco: Never Used     Comment: remote smoking history  . Alcohol Use: 0.0 oz/week    0 Shots of liquor per week     Comment: occ   . Drug Use: Yes     Comment: [place]  . Sexual Activity: Yes   Other Topics Concern  . 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      Home Situation: lives with husband and 60 yo son      Spiritual Beliefs: Christian      Lifestyle: elliptical 3-4 times per week; working on diet as well over last year in 2014              Current outpatient prescriptions:  .  aspirin 81 MG tablet, Take 81 mg by mouth daily., Disp: , Rfl:  .  b complex vitamins capsule, Take 1 capsule by mouth daily., Disp: , Rfl:  .  BIOTIN 5000 PO, Take 1 capsule by mouth daily. , Disp: , Rfl:  .  Calcium Carbonate-Vit D-Min (CALTRATE 600+D PLUS MINERALS PO), Take 1 tablet by mouth daily., Disp: , Rfl:  .  gabapentin (NEURONTIN) 100 MG capsule, Take 3 caps three times a day, Disp: 810 capsule, Rfl: 1 .  hydroxychloroquine (PLAQUENIL) 200 MG tablet, Take 200 mg by mouth daily., Disp: , Rfl:  .  lisinopril-hydrochlorothiazide (PRINZIDE,ZESTORETIC) 20-12.5 MG tablet, Take 1 tablet by mouth daily., Disp: 90 tablet, Rfl: 0 .  loratadine (CLARITIN) 10 MG tablet, Take 10 mg by mouth daily., Disp: , Rfl:  .  MAGNESIUM PO, Take by mouth daily., Disp: , Rfl:  .  omeprazole (PRILOSEC) 40 MG capsule, Take 1 capsule (40 mg total) by mouth daily., Disp: 30 capsule, Rfl: 4 .  SUMAtriptan (IMITREX) 25 MG tablet, Take 1 tablet (25 mg total) by mouth every 2 (two) hours as needed for migraine. May repeat in 2 hours if needed. Do not take more then 2 tablets in 24 hours., Disp: 10 tablet, Rfl: 0 .  tamoxifen (NOLVADEX) 20 MG tablet, Take 1 tablet (20 mg total) by mouth daily., Disp: 90 tablet, Rfl: 3 .  tiZANidine (ZANAFLEX) 4 MG tablet, Take 1 tablet (4  mg total) by mouth every 6 (six) hours as needed for muscle spasms., Disp: 30 tablet, Rfl: 6 .  traMADol (ULTRAM) 50 MG tablet, Take 1 tablet (50 mg total) by mouth every 12 (twelve) hours as needed., Disp: 30 tablet, Rfl: 0 .  TURMERIC PO, Take by mouth daily., Disp: , Rfl:  .  venlafaxine XR (EFFEXOR-XR) 75 MG 24 hr capsule, TAKE ONE CAPSULE BY MOUTH ONCE DAILY WITH BREAKFAST, Disp: 90 capsule, Rfl: 0 .  VITAMIN E EX, Apply topically. Vaginal suppository, Disp: , Rfl:  No current facility-administered medications for  this visit.  Facility-Administered Medications Ordered in Other Visits:  .  prochlorperazine (COMPAZINE) injection 10 mg, 10 mg, Intravenous, Q6H PRN, Minette Headland, NP, 10 mg at 12/26/13 0917  EXAM:  Filed Vitals:   10/25/15 1142 10/25/15 1146  BP: 124/90 140/90  Pulse: 81   Temp: 98.7 F (37.1 C)     GENERAL: vitals reviewed and listed below, alert, oriented, appears well hydrated and in no acute distress  HEENT: head atraumatic, PERRLA, normal appearance of eyes, ears, nose and mouth. moist mucus membranes.  NECK: supple, no masses or lymphadenopathy  LUNGS: clear to auscultation bilaterally, no rales, rhonchi or wheeze  CV: HRRR, no peripheral edema or cyanosis, normal pedal pulses  BREAST:declined, does with gyn/onc  ABDOMEN: bowel sounds normal, soft, non tender to palpation, no masses, no rebound or guarding  [id number], does with gyn  SKIN: no rash or abnormal lesions  MS: normal gait, moves all extremities normally  NEURO: CN II-XII grossly intact, normal muscle strength and sensation to light touch on extremities  PSYCH: normal affect, pleasant and cooperative  ASSESSMENT AND PLAN:  Discussed the following assessment and plan:  Visit for preventive health examination - Plan: Lipid panel  Essential hypertension - Plan: Hemoglobin A1c  Gastroesophageal reflux disease, esophagitis presence not specified  -she did not take blood  pressure medications today, plans to take daily and recheck in 1 week with nurse and bring form for completion at that time.  -Discussed and advised all [place] preventive services health task force level A and B recommendations for age, sex and risks.  -Advised at least 150 minutes of exercise per week and a healthy diet low in saturated fats and sweets and consisting of fresh fruits and vegetables, lean meats such as fish and white chicken and whole grains.  -labs, studies and vaccines per orders this encounter  Orders Placed This Encounter  Procedures  . Lipid panel  . Hemoglobin A1c    Patient advised to return to clinic immediately if symptoms worsen or persist or new concerns.  Patient Instructions   Before you leave:  - schedule nurse visit in 1-2 weeks for recheck of your blood pressure , please bring form to complete at that time   - labs   - schedule follow-up in 3-4 months    please try the dietary changes for acid reflux. Please try to eat small regular meals and get regular exercise. Please try the over-the-counter dose of Prilosec or Nexium instead of the high dose of Prilosec(omeprazole).   Please check on the cologuard versus the colonoscopy and let us know which one he would like for [place] to order.  -We have ordered labs or studies at this visit. It can take up to 1-2 weeks for results and processing. We will contact you with instructions IF your results are abnormal. Normal results will be released to your [facility]. If you have not heard from [place] or can not find your results in [facility] in 2 weeks please contact our office.  We recommend the following healthy lifestyle measures: - eat a healthy whole foods diet consisting of regular small meals composed of vegetables, fruits, beans, nuts, seeds, healthy meats such as white chicken and fish and whole grains.  - avoid sweets, white starchy foods, fried foods, fast food, processed foods, sodas, red meet and other fattening foods.    - get a least 150-300 minutes of aerobic exercise per week.    Gastroesophageal Reflux Disease, Adult Normally, food travels down  the esophagus and stays in the stomach to be digested. However, when a person has gastroesophageal reflux disease (GERD), food and stomach acid move back up into the esophagus. When this happens, the esophagus becomes sore and inflamed. Over time, GERD can create small holes (ulcers) in the lining of the esophagus.  CAUSES This condition is caused by a problem with the muscle between the esophagus and the stomach (lower esophageal sphincter, or LES). Normally, the LES muscle closes after food passes through the esophagus to the stomach. When the LES is weakened or abnormal, it does not close properly, and that allows food and stomach acid to go back up into the esophagus. The LES can be weakened by certain dietary substances, medicines, and medical conditions, including:  Tobacco use.  Pregnancy.  Having a hiatal hernia.  Heavy alcohol use.  Certain foods and beverages, such as coffee, chocolate, onions, and peppermint. RISK FACTORS This condition is more likely to develop in:  People who have an increased body weight.  People who have connective tissue disorders.  People who use NSAID medicines. SYMPTOMS Symptoms of this condition include:  Heartburn.  Difficult or painful swallowing.  The feeling of having a lump in the throat.  Abitter taste in the mouth.  Bad breath.  Having a large amount of saliva.  Having an upset or bloated stomach.  Belching.  Chest pain.  Shortness of breath or wheezing.  Ongoing (chronic) cough or a night-time cough.  Wearing away of tooth enamel.  Weight loss. Different conditions can cause chest pain. Make sure to see your health care provider if you experience chest pain. DIAGNOSIS Your health care provider will take a medical history and perform a physical exam. To determine if you have mild or  severe GERD, your health care provider may also monitor how you respond to treatment. You may also have other tests, including:  An endoscopy toexamine your stomach and esophagus with a small camera.  A test thatmeasures the acidity level in your esophagus.  A test thatmeasures how much pressure is on your esophagus.  A barium swallow or modified barium swallow to show the shape, size, and functioning of your esophagus. TREATMENT The goal of treatment is to help relieve your symptoms and to prevent complications. Treatment for this condition may vary depending on how severe your symptoms are. Your health care provider may recommend:  Changes to your diet.  Medicine.  Surgery. HOME CARE INSTRUCTIONS Diet  Follow a diet as recommended by your health care provider. This may involve avoiding foods and drinks such as:  Coffee and tea (with or without caffeine).  Drinks that containalcohol.  Energy drinks and sports drinks.  Carbonated drinks or sodas.  Chocolate and cocoa.  Peppermint and mint flavorings.  Garlic and onions.  Horseradish.  Spicy and acidic foods, including peppers, chili powder, curry powder, vinegar, hot sauces, and barbecue sauce.  Citrus fruit juices and citrus fruits, such as oranges, lemons, and limes.  Tomato-based foods, such as red sauce, chili, salsa, and pizza with red sauce.  Fried and fatty foods, such as donuts, french fries, potato chips, and high-fat dressings.  High-fat meats, such as hot dogs and fatty cuts of red and white meats, such as rib eye steak, sausage, ham, and bacon.  High-fat dairy items, such as whole milk, butter, and cream cheese.  Eat small, frequent meals instead of large meals.  Avoid drinking large amounts of liquid with your meals.  Avoid eating meals during the 2-3  hours before bedtime.  Avoid lying down right after you eat.  Do not exercise right after you eat. General Instructions  Pay attention to  any changes in your symptoms.  Take over-the-counter and prescription medicines only as told by your health care provider. Do not take aspirin, ibuprofen, or other NSAIDs unless your health care provider told you to do so.  Do not use any tobacco products, including cigarettes, chewing tobacco, and e-cigarettes. If you need help quitting, ask your health care provider.  Wear loose-fitting clothing. Do not wear anything tight around your waist that causes pressure on your abdomen.  Raise (elevate) the head of your bed 6 inches (15cm).  Try to reduce your stress, such as with yoga or meditation. If you need help reducing stress, ask your health care provider.  If you are overweight, reduce your weight to an amount that is healthy for you. Ask your health care provider for guidance about a safe weight loss goal.  Keep all follow-up visits as told by your health care provider. This is important. SEEK MEDICAL CARE IF:  You have new symptoms.  You have unexplained weight loss.  You have difficulty swallowing, or it hurts to swallow.  You have wheezing or a persistent cough.  Your symptoms do not improve with treatment.  You have a hoarse voice. SEEK IMMEDIATE MEDICAL CARE IF:  You have pain in your arms, neck, jaw, teeth, or back.  You feel sweaty, dizzy, or light-headed.  You have chest pain or shortness of breath.  You vomit and your vomit looks like blood or coffee grounds.  You faint.  Your stool is bloody or black.  You cannot swallow, drink, or eat.   This information is not intended to replace advice given to you by your health care provider. Make sure you discuss any questions you have with your health care provider.   Document Released: 04/25/2005 Document Revised: 04/06/2015 Document Reviewed: 11/10/2014 Elsevier Interactive Patient Education 2016 [employer].         No Follow-up on file.  Colin Benton R.

## 2015-10-25 NOTE — Patient Instructions (Addendum)
Before you leave:  - schedule nurse visit in 1-2 weeks for recheck of your blood pressure , please bring form to complete at that time   - labs   - schedule follow-up in 3-4 months    please try the dietary changes for acid reflux. Please try to eat small regular meals and get regular exercise. Please try the over-the-counter dose of Prilosec or Nexium instead of the high dose of Prilosec(omeprazole).   Please check on the cologuard versus the colonoscopy and let us know which one he would like for [place] to order.  -We have ordered labs or studies at this visit. It can take up to 1-2 weeks for results and processing. We will contact you with instructions IF your results are abnormal. Normal results will be released to your [facility]. If you have not heard from [place] or can not find your results in [facility] in 2 weeks please contact our office.  We recommend the following healthy lifestyle measures: - eat a healthy whole foods diet consisting of regular small meals composed of vegetables, fruits, beans, nuts, seeds, healthy meats such as white chicken and fish and whole grains.  - avoid sweets, white starchy foods, fried foods, fast food, processed foods, sodas, red meet and other fattening foods.  - get a least 150-300 minutes of aerobic exercise per week.    Gastroesophageal Reflux Disease, Adult Normally, food travels down the esophagus and stays in the stomach to be digested. However, when a person has gastroesophageal reflux disease (GERD), food and stomach acid move back up into the esophagus. When this happens, the esophagus becomes sore and inflamed. Over time, GERD can create small holes (ulcers) in the lining of the esophagus.  CAUSES This condition is caused by a problem with the muscle between the esophagus and the stomach (lower esophageal sphincter, or LES). Normally, the LES muscle closes after food passes through the esophagus to the stomach. When the LES is weakened or abnormal, it does  not close properly, and that allows food and stomach acid to go back up into the esophagus. The LES can be weakened by certain dietary substances, medicines, and medical conditions, including:  Tobacco use.  Pregnancy.  Having a hiatal hernia.  Heavy alcohol use.  Certain foods and beverages, such as coffee, chocolate, onions, and peppermint. RISK FACTORS This condition is more likely to develop in:  People who have an increased body weight.  People who have connective tissue disorders.  People who use NSAID medicines. SYMPTOMS Symptoms of this condition include:  Heartburn.  Difficult or painful swallowing.  The feeling of having a lump in the throat.  Abitter taste in the mouth.  Bad breath.  Having a large amount of saliva.  Having an upset or bloated stomach.  Belching.  Chest pain.  Shortness of breath or wheezing.  Ongoing (chronic) cough or a night-time cough.  Wearing away of tooth enamel.  Weight loss. Different conditions can cause chest pain. Make sure to see your health care provider if you experience chest pain. DIAGNOSIS Your health care provider will take a medical history and perform a physical exam. To determine if you have mild or severe GERD, your health care provider may also monitor how you respond to treatment. You may also have other tests, including:  An endoscopy toexamine your stomach and esophagus with a small camera.  A test thatmeasures the acidity level in your esophagus.  A test thatmeasures how much pressure is on your esophagus.  A  barium swallow or modified barium swallow to show the shape, size, and functioning of your esophagus. TREATMENT The goal of treatment is to help relieve your symptoms and to prevent complications. Treatment for this condition may vary depending on how severe your symptoms are. Your health care provider may recommend:  Changes to your diet.  Medicine.  Surgery. HOME CARE  INSTRUCTIONS Diet  Follow a diet as recommended by your health care provider. This may involve avoiding foods and drinks such as:  Coffee and tea (with or without caffeine).  Drinks that containalcohol.  Energy drinks and sports drinks.  Carbonated drinks or sodas.  Chocolate and cocoa.  Peppermint and mint flavorings.  Garlic and onions.  Horseradish.  Spicy and acidic foods, including peppers, chili powder, curry powder, vinegar, hot sauces, and barbecue sauce.  Citrus fruit juices and citrus fruits, such as oranges, lemons, and limes.  Tomato-based foods, such as red sauce, chili, salsa, and pizza with red sauce.  Fried and fatty foods, such as donuts, french fries, potato chips, and high-fat dressings.  High-fat meats, such as hot dogs and fatty cuts of red and white meats, such as rib eye steak, sausage, ham, and bacon.  High-fat dairy items, such as whole milk, butter, and cream cheese.  Eat small, frequent meals instead of large meals.  Avoid drinking large amounts of liquid with your meals.  Avoid eating meals during the 2-3 hours before bedtime.  Avoid lying down right after you eat.  Do not exercise right after you eat. General Instructions  Pay attention to any changes in your symptoms.  Take over-the-counter and prescription medicines only as told by your health care provider. Do not take aspirin, ibuprofen, or other NSAIDs unless your health care provider told you to do so.  Do not use any tobacco products, including cigarettes, chewing tobacco, and e-cigarettes. If you need help quitting, ask your health care provider.  Wear loose-fitting clothing. Do not wear anything tight around your waist that causes pressure on your abdomen.  Raise (elevate) the head of your bed 6 inches (15cm).  Try to reduce your stress, such as with yoga or meditation. If you need help reducing stress, ask your health care provider.  If you are overweight, reduce your  weight to an amount that is healthy for you. Ask your health care provider for guidance about a safe weight loss goal.  Keep all follow-up visits as told by your health care provider. This is important. SEEK MEDICAL CARE IF:  You have new symptoms.  You have unexplained weight loss.  You have difficulty swallowing, or it hurts to swallow.  You have wheezing or a persistent cough.  Your symptoms do not improve with treatment.  You have a hoarse voice. SEEK IMMEDIATE MEDICAL CARE IF:  You have pain in your arms, neck, jaw, teeth, or back.  You feel sweaty, dizzy, or light-headed.  You have chest pain or shortness of breath.  You vomit and your vomit looks like blood or coffee grounds.  You faint.  Your stool is bloody or black.  You cannot swallow, drink, or eat.   This information is not intended to replace advice given to you by your health care provider. Make sure you discuss any questions you have with your health care provider.   Document Released: 04/25/2005 Document Revised: 04/06/2015 Document Reviewed: 11/10/2014 Elsevier Interactive Patient Education [employer].

## 2015-10-25 NOTE — Progress Notes (Signed)
Pre visit review using our clinic review tool, if applicable. No additional management support is needed unless otherwise documented below in the visit note. 

## 2015-11-02 DIAGNOSIS — M797 Fibromyalgia: Secondary | ICD-10-CM | POA: Diagnosis not present

## 2015-11-03 ENCOUNTER — Ambulatory Visit (INDEPENDENT_AMBULATORY_CARE_PROVIDER_SITE_OTHER): Payer: BLUE CROSS/BLUE SHIELD | Admitting: Psychology

## 2015-11-03 DIAGNOSIS — F4323 Adjustment disorder with mixed anxiety and depressed mood: Secondary | ICD-10-CM | POA: Diagnosis not present

## 2015-11-03 DIAGNOSIS — M797 Fibromyalgia: Secondary | ICD-10-CM | POA: Diagnosis not present

## 2015-11-04 DIAGNOSIS — M797 Fibromyalgia: Secondary | ICD-10-CM | POA: Diagnosis not present

## 2015-11-07 ENCOUNTER — Encounter: Payer: Self-pay | Admitting: Neurology

## 2015-11-07 ENCOUNTER — Ambulatory Visit (INDEPENDENT_AMBULATORY_CARE_PROVIDER_SITE_OTHER): Payer: BLUE CROSS/BLUE SHIELD | Admitting: Neurology

## 2015-11-07 VITALS — BP 122/82 | HR 91 | Ht 64.0 in | Wt 181.8 lb

## 2015-11-07 DIAGNOSIS — M542 Cervicalgia: Secondary | ICD-10-CM

## 2015-11-07 DIAGNOSIS — R51 Headache: Secondary | ICD-10-CM

## 2015-11-07 DIAGNOSIS — M797 Fibromyalgia: Secondary | ICD-10-CM | POA: Diagnosis not present

## 2015-11-07 DIAGNOSIS — G4486 Cervicogenic headache: Secondary | ICD-10-CM

## 2015-11-07 MED ORDER — GABAPENTIN 300 MG PO CAPS: 300.0000 mg | ORAL_CAPSULE | Freq: Three times a day (TID) | ORAL | Status: DC

## 2015-11-07 NOTE — Progress Notes (Signed)
NEUROLOGY FOLLOW UP OFFICE NOTE  Hendy Sonne MF:[id number]  HISTORY OF PRESENT ILLNESS: I had the pleasure of seeing Tina [name] in follow-up in the neurology clinic on 11/07/2015.  The patient was last seen 7 months ago for new onset headaches localized over the right hemisphere, suggestive of cervicogenic headaches. She had increased gabapentin back to 300mg  TID and was referred for physical therapy and has had excellent response, with no further headaches. In the past 6 months, she has only had 2 mild headaches last week that went away quickly. She denies any dizziness, vision changes, no falls. She continues to have chronic neck pain, as well as right wrist pain and left hand numbness. She has noticed that if she misses a dose of gabapentin, her feet start tingling. She had been feeling fatigued recently, but this has improved some.  HPI: This is a pleasant 55 yo RH woman with a history of breast cancer s/p radiation and chemotherapy, hypertension, polyarthralgia, presenting for evaluation of new onset headaches. She states she has had tension or sinus headaches in the past, but a couple of months ago started having a different kind of headache with pain on the right hemisphere radiating down the right side of her face and down her neck. She sometimes feels the gums on her right side feel sore. She describes a pressure-like pain, with occasional brief sharp, stabbing pains. There is occasional tingling in her scalp on the right side. Headache intensity can go up to a 10/10 where she would have nausea and vomiting, one time it appears she got cold and clammy and possibly briefly passed out. Headaches occur every 2 weeks or so, but would last for several days. She reports that she had a headache daily for 2 weeks until last week. She was feeling better for a week, then again has a mild headache today. This is associated with neck pain, and she reports that shoulder and neck pain have been bothering  her on and off for years, but this is the worst it has been. She has seen her rheumatologist and told she may have fibromyalgia.  She denies any vision changes, no visual obscurations, conjunctival injection. She denies any diplopia, dysarthria, dysphagia, focal weakness, bowel/bladder incontinence. She has been told she has fluid in the right ear, and has occasional high-pitched tinnitus. She has intermittent numbness in her left arm on a daily basis. She has chronic back pain and states Flexeril did not help, but she would use marijuana and this relaxes her and relieves the pain. She had been taking gabapentin 300mg  TID for a year for neuropathy secondary to chemo, and had self-reduced it a couple of months ago to 100mg  TID. She reports taking prn Ativan for nausea. She reports sleep is fine, she had a sleep study negative for sleep apnea. She denies any history of head injuries or recent infections, no family history of headaches.   I personally reviewed MRI brain with and without contrast which was normal, no abnormal enhancement seen. Her MRI C-spine showed degenerative change from C3-4 through C7-T1. At C4-5, there is a central disc herniation that contacts the ventral aspect of the cord but does not frankly compress or show any cord edema. There is mild foraminal narrowing on the left at C5-6 and bilaterally at C6-7, but no definite compression of the exiting nerve roots. C7-T1 shows a shallow broad-based disc herniation but without visible nerve compression.  PAST MEDICAL HISTORY: Past Medical History  Diagnosis Date  .  Lupus [facility] [place])     rheumatologist - Dr. Estanislado Pandy  . Hypertension   . Leukopenia 06/16/2013  . Cancer of right breast ([place]) 09/03/13    Invasive Ductal Carcinoma/Ductal Carcinoma Insitu  biopsies  . Anxiety   . GERD (gastroesophageal reflux disease)     hx esophagitis  . Degenerative joint disease     back neck and shoulder  . 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 S/P radiation therapy 04/27/2014-06/07/2014    1) Right breast, 50 Gy in 25 fractions/ 2) Right breast boost, 10 Gy in 5 fractions  . Fibromyalgia     Dr. Estanislado Pandy  . Migraines   . Hot flashes     MEDICATIONS: Current Outpatient Prescriptions on File Prior to Visit  Medication Sig Dispense Refill  . aspirin 81 MG tablet Take 81 mg by mouth daily.    Marland Kitchen b complex vitamins capsule Take 1 capsule by mouth daily.    Marland Kitchen BIOTIN 5000 PO Take 1 capsule by mouth daily.     . Calcium Carbonate-Vit D-Min (CALTRATE 600+D PLUS MINERALS PO) Take 1 tablet by mouth daily.    Marland Kitchen gabapentin (NEURONTIN) 100 MG capsule Take 3 caps three times a day 810 capsule 1  . hydroxychloroquine (PLAQUENIL) 200 MG tablet Take 200 mg by mouth daily.    Marland Kitchen lisinopril-hydrochlorothiazide (PRINZIDE,ZESTORETIC) 20-12.5 MG tablet Take 1 tablet by mouth daily. 90 tablet 0  . loratadine (CLARITIN) 10 MG tablet Take 10 mg by mouth daily.    Marland Kitchen MAGNESIUM PO Take by mouth daily.    Marland Kitchen omeprazole (PRILOSEC) 40 MG capsule Take 1 capsule (40 mg total) by mouth daily. 30 capsule 4  . SUMAtriptan (IMITREX) 25 MG tablet Take 1 tablet (25 mg total) by mouth every 2 (two) hours as needed for migraine. May repeat in 2 hours if needed. Do not take more then 2 tablets in 24 hours. 10 tablet 0  . tamoxifen (NOLVADEX) 20 MG tablet Take 1 tablet (20 mg total) by mouth daily. 90 tablet 3  . tiZANidine (ZANAFLEX) 4 MG tablet Take 1 tablet (4 mg total) by mouth every 6 (six) hours as needed for muscle spasms. 30 tablet 6  . traMADol (ULTRAM) 50 MG tablet Take 1 tablet (50 mg total) by mouth every 12 (twelve) hours as needed. 30 tablet 0  . TURMERIC PO Take by mouth daily.    Marland Kitchen VITAMIN E EX Apply topically. Vaginal suppository     Current Facility-Administered Medications on File Prior to Visit  Medication Dose Route Frequency Provider Last Rate Last Dose  . prochlorperazine (COMPAZINE) injection 10 mg  10 mg Intravenous Q6H PRN  Minette Headland, NP   10 mg at 12/26/13 [id number]    ALLERGIES: Allergies  Allergen Reactions  . Adhesive [Tape] Itching    Burned and scarred skinned  . Other     Cholroprep-causes rash  . Nickel Rash    FAMILY HISTORY: Family History  Problem Relation Age of Onset  . Hypertension Mother   . Dementia Mother     small vessel disease  . Cancer Mother 30    cervical  cancer  . Cancer Father 73    prostate ca  . Diabetes Father   . Arthritis Father     SOCIAL HISTORY: Social History   Social History  . Marital Status: Married    Spouse Name: N/A  . Number of Children: 1  . Years of Education: N/A  Occupational History  .      homemaker   Social History Main Topics  . Smoking status: Former Smoker -- 1.00 packs/day for 15 years    Types: Cigars, Cigarettes    Quit date: 02/23/2004  . Smokeless tobacco: Never Used     Comment: remote smoking history  . Alcohol Use: 0.0 oz/week    0 Shots of liquor per week     Comment: occ   . Drug Use: Yes     Comment: Marjuana  . Sexual Activity: Yes   Other Topics Concern  . 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      Home Situation: lives with husband and 62 yo son      Spiritual Beliefs: Christian      Lifestyle: elliptical 3-4 times per week; working on diet as well over last year in 2014             REVIEW OF SYSTEMS: Constitutional: No fevers, chills, or sweats, no generalized fatigue, change in appetite Eyes: No visual changes, double vision, eye pain Ear, nose and throat: No hearing loss, ear pain, nasal congestion, sore throat Cardiovascular: No chest pain, palpitations Respiratory:  No shortness of breath at rest or with exertion, wheezes GastrointestinaI: No nausea, vomiting, diarrhea, abdominal pain, fecal incontinence Genitourinary:  No dysuria, urinary retention or frequency Musculoskeletal:  + neck pain, back pain Integumentary: No rash, pruritus, skin  lesions Neurological: as above Psychiatric: No depression, insomnia, anxiety Endocrine: No palpitations, fatigue, diaphoresis, mood swings, change in appetite, change in weight, increased thirst Hematologic/Lymphatic:  No anemia, purpura, petechiae. Allergic/Immunologic: no itchy/runny eyes, nasal congestion, recent allergic reactions, rashes  PHYSICAL EXAM: Filed Vitals:   11/07/15 1539  BP: 122/82  Pulse: 91   General: No acute distress Head:  Normocephalic/atraumatic Neck: supple, no paraspinal tenderness, full range of motion Heart:  Regular rate and rhythm Lungs:  Clear to auscultation bilaterally Back: No paraspinal tenderness Skin/Extremities: No rash, no edema Neurological Exam: alert and oriented to person, place, and time. No aphasia or dysarthria. Fund of knowledge is appropriate.  Recent and remote memory are intact.  Attention and concentration are normal.    Able to name objects and repeat phrases. Cranial nerves: Pupils equal, round, reactive to light. Extraocular movements intact with no nystagmus. Visual fields full. Facial sensation intact. No facial asymmetry. Tongue, uvula, palate midline.  Motor: Bulk and tone normal, muscle strength 5/5 throughout with no pronator drift, including APB bilaterally. Sensation to light touch intact.  No extinction to double simultaneous stimulation.  Deep tendon reflexes 2+ throughout, toes downgoing.  Finger to nose testing intact.  Gait narrow-based and steady, able to tandem walk adequately.  Romberg negative.  IMPRESSION: This is a pleasant 55 yo RH woman with a history of breast cancer s/p radiation and chemotherapy, hypertension, polyarthralgia, who presented with new onset headaches localized over the right side, with significant neck pain, suggestive of cervicogenic headaches. Her neurological exam and MRI brain are normal. She has had excellent response to PT and increased gabapentin dose 300mg  TID with no side effects. She denies  any significant headaches in the past 6 months. Continue Gabapentin 300mg  TID, refills sent. Continue home neck exercises. She will follow-up in 1 year and knows to call our office for any changes.  Thank you for allowing me to participate in her care.  Please do not hesitate to call for any questions or concerns.  The duration  of this appointment visit was 15 minutes of face-to-face time with the patient.  Greater than 50% of this time was spent in counseling, explanation of diagnosis, planning of further management, and coordination of care.   Ellouise Newer, M.D.   CC: Dr. Maudie Mercury

## 2015-11-07 NOTE — Patient Instructions (Signed)
1. We will switch to a different dose of gabapentin capsules, stop your current prescription. Start gabapentin 300mg : take 1 capsule three times a day 2. Continue with neck exercises 3. Follow-up in 1 year, call for any changes

## 2015-11-08 ENCOUNTER — Ambulatory Visit: Payer: Self-pay | Admitting: *Deleted

## 2015-11-09 DIAGNOSIS — M797 Fibromyalgia: Secondary | ICD-10-CM | POA: Diagnosis not present

## 2015-11-10 ENCOUNTER — Other Ambulatory Visit: Payer: Self-pay | Admitting: Family Medicine

## 2015-11-10 DIAGNOSIS — M797 Fibromyalgia: Secondary | ICD-10-CM | POA: Diagnosis not present

## 2015-11-11 ENCOUNTER — Ambulatory Visit: Payer: BLUE CROSS/BLUE SHIELD | Admitting: Neurology

## 2015-11-14 DIAGNOSIS — M797 Fibromyalgia: Secondary | ICD-10-CM | POA: Diagnosis not present

## 2015-11-15 ENCOUNTER — Telehealth: Payer: Self-pay | Admitting: *Deleted

## 2015-11-15 NOTE — Telephone Encounter (Signed)
Can you please check with the patient on her tramadol use? This was prescribed by her neurologist, but I think she doesn't use the tramadol on a regular basis. If she is not using the tramadol, okay to send a refill on the venlafaxine.

## 2015-11-15 NOTE — Telephone Encounter (Signed)
I left a message for the pt to return my call. 

## 2015-11-15 NOTE — Telephone Encounter (Signed)
Per [facility] called on 4/17 wanting to know if they should fill the Rx fo Venlafaxine since the pt is taking Tramadol? Ref# [id number].

## 2015-11-16 MED ORDER — VENLAFAXINE HCL ER 75 MG PO CP24: 75.0000 mg | ORAL_CAPSULE | Freq: Every day | ORAL | Status: DC

## 2015-11-16 NOTE — Telephone Encounter (Signed)
Pt called back called transferred to [facility].

## 2015-11-16 NOTE — Telephone Encounter (Signed)
I informed the pt of the message below and she stated she is not taking Tramadol any longer as this did not help her pain.  She is aware the refill was sent to her pharmacy and Tramadol was removed from the medication list per the pts request.

## 2015-11-17 ENCOUNTER — Telehealth: Payer: Self-pay | Admitting: Family Medicine

## 2015-11-17 NOTE — Telephone Encounter (Signed)
Rx sent on 4/19 with a note to the pharmacist stating Rx was approved by Dr Maudie Mercury.

## 2015-11-17 NOTE — Telephone Encounter (Signed)
Pharm is calling concerning a drug interaction with venlafaxine and tramadol. Please call pharm back

## 2015-11-21 DIAGNOSIS — Z79899 Other long term (current) drug therapy: Secondary | ICD-10-CM | POA: Diagnosis not present

## 2015-11-21 DIAGNOSIS — R300 Dysuria: Secondary | ICD-10-CM | POA: Diagnosis not present

## 2015-11-22 DIAGNOSIS — R5381 Other malaise: Secondary | ICD-10-CM | POA: Diagnosis not present

## 2015-11-22 DIAGNOSIS — M359 Systemic involvement of connective tissue, unspecified: Secondary | ICD-10-CM | POA: Diagnosis not present

## 2015-11-22 DIAGNOSIS — M19241 Secondary osteoarthritis, right hand: Secondary | ICD-10-CM | POA: Diagnosis not present

## 2015-11-22 DIAGNOSIS — Z09 Encounter for follow-up examination after completed treatment for conditions other than malignant neoplasm: Secondary | ICD-10-CM | POA: Diagnosis not present

## 2015-11-25 ENCOUNTER — Ambulatory Visit: Payer: BLUE CROSS/BLUE SHIELD | Admitting: Psychology

## 2015-12-07 DIAGNOSIS — M797 Fibromyalgia: Secondary | ICD-10-CM | POA: Diagnosis not present

## 2015-12-08 DIAGNOSIS — K219 Gastro-esophageal reflux disease without esophagitis: Secondary | ICD-10-CM | POA: Diagnosis not present

## 2015-12-08 DIAGNOSIS — K5900 Constipation, unspecified: Secondary | ICD-10-CM | POA: Diagnosis not present

## 2015-12-08 DIAGNOSIS — R112 Nausea with vomiting, unspecified: Secondary | ICD-10-CM | POA: Diagnosis not present

## 2015-12-08 DIAGNOSIS — K625 Hemorrhage of anus and rectum: Secondary | ICD-10-CM | POA: Diagnosis not present

## 2015-12-08 DIAGNOSIS — R1310 Dysphagia, unspecified: Secondary | ICD-10-CM | POA: Diagnosis not present

## 2015-12-09 DIAGNOSIS — M797 Fibromyalgia: Secondary | ICD-10-CM | POA: Diagnosis not present

## 2015-12-13 DIAGNOSIS — M797 Fibromyalgia: Secondary | ICD-10-CM | POA: Diagnosis not present

## 2015-12-14 DIAGNOSIS — M797 Fibromyalgia: Secondary | ICD-10-CM | POA: Diagnosis not present

## 2015-12-16 DIAGNOSIS — M797 Fibromyalgia: Secondary | ICD-10-CM | POA: Diagnosis not present

## 2015-12-19 ENCOUNTER — Telehealth: Payer: Self-pay | Admitting: *Deleted

## 2015-12-19 ENCOUNTER — Other Ambulatory Visit: Payer: Self-pay | Admitting: *Deleted

## 2015-12-19 DIAGNOSIS — Z0000 Encounter for general adult medical examination without abnormal findings: Secondary | ICD-10-CM

## 2015-12-19 NOTE — Telephone Encounter (Signed)
Patient came in today with her husband and stated she forgot to have her labs done at the physical appt in 09/2015 and wanted to know if she could have this done another time?  I informed her she can call in for a lab appt and the message was forwarded to Dr Maudie Mercury to enter the orders.

## 2015-12-20 DIAGNOSIS — C50411 Malignant neoplasm of upper-outer quadrant of right female breast: Secondary | ICD-10-CM | POA: Diagnosis not present

## 2015-12-21 DIAGNOSIS — M797 Fibromyalgia: Secondary | ICD-10-CM | POA: Diagnosis not present

## 2015-12-23 ENCOUNTER — Telehealth: Payer: Self-pay | Admitting: Family Medicine

## 2015-12-23 NOTE — Telephone Encounter (Signed)
Pt states she is needs to have her bp rechecked.  The last time it was not good. Pt is coming in 6/8 for her labs that were ordered and wants to know if ok to have bp check at that time.

## 2015-12-26 NOTE — Telephone Encounter (Signed)
Sure. Please set up visit with Wendie Simmer. Thanks.

## 2015-12-27 NOTE — Telephone Encounter (Signed)
I called the pt and informed her of the message below.  I added a note to her lab appt to see me for a BP check also.

## 2015-12-29 DIAGNOSIS — M797 Fibromyalgia: Secondary | ICD-10-CM | POA: Diagnosis not present

## 2016-01-02 DIAGNOSIS — K5730 Diverticulosis of large intestine without perforation or abscess without bleeding: Secondary | ICD-10-CM | POA: Diagnosis not present

## 2016-01-02 DIAGNOSIS — R1013 Epigastric pain: Secondary | ICD-10-CM | POA: Diagnosis not present

## 2016-01-02 DIAGNOSIS — Z1211 Encounter for screening for malignant neoplasm of colon: Secondary | ICD-10-CM | POA: Diagnosis not present

## 2016-01-02 DIAGNOSIS — K219 Gastro-esophageal reflux disease without esophagitis: Secondary | ICD-10-CM | POA: Diagnosis not present

## 2016-01-02 DIAGNOSIS — R1310 Dysphagia, unspecified: Secondary | ICD-10-CM | POA: Diagnosis not present

## 2016-01-02 LAB — HM COLONOSCOPY

## 2016-01-05 ENCOUNTER — Encounter: Payer: Self-pay | Admitting: Family Medicine

## 2016-01-05 ENCOUNTER — Ambulatory Visit (INDEPENDENT_AMBULATORY_CARE_PROVIDER_SITE_OTHER): Payer: BLUE CROSS/BLUE SHIELD | Admitting: Family Medicine

## 2016-01-05 ENCOUNTER — Other Ambulatory Visit (INDEPENDENT_AMBULATORY_CARE_PROVIDER_SITE_OTHER): Payer: BLUE CROSS/BLUE SHIELD

## 2016-01-05 VITALS — HR 86 | Temp 98.8°F | Ht 64.0 in | Wt 182.0 lb

## 2016-01-05 DIAGNOSIS — J069 Acute upper respiratory infection, unspecified: Secondary | ICD-10-CM | POA: Diagnosis not present

## 2016-01-05 DIAGNOSIS — Z0000 Encounter for general adult medical examination without abnormal findings: Secondary | ICD-10-CM | POA: Diagnosis not present

## 2016-01-05 LAB — LIPID PANEL
CHOLESTEROL: 200 mg/dL (ref 0–200)
HDL: 60.4 mg/dL (ref >39.00)
LDL CALC: 113 mg/dL — AB (ref 0–99)
NONHDL: 139.11
Total CHOL/HDL Ratio: 3
Triglycerides: 131 mg/dL (ref 0.0–149.0)
VLDL: 26.2 mg/dL (ref 0.0–40.0)

## 2016-01-05 LAB — HEMOGLOBIN A1C: HEMOGLOBIN A1C: 5.8 % (ref 4.6–6.5)

## 2016-01-05 MED ORDER — AMOXICILLIN-POT CLAVULANATE 875-125 MG PO TABS: 1.0000 | ORAL_TABLET | Freq: Two times a day (BID) | ORAL | Status: DC

## 2016-01-05 MED ORDER — BENZONATATE 100 MG PO CAPS: 100.0000 mg | ORAL_CAPSULE | Freq: Two times a day (BID) | ORAL | Status: DC | PRN

## 2016-01-05 NOTE — Progress Notes (Signed)
HPI:  URI: -started: 2 days ago -symptoms:nasal congestion, sore throat, cough, R ear pain, low grade temp 99 -denies:fever >100, SOB, NVD, tooth pain -has tried: tylenol -sick contacts/travel/risks: no reported flu, strep or tick exposure - grandson with a cold  -Hx of: allergies, immunocompromised state  She also left form to complete from work - biometric  ROS: See pertinent positives and negatives per HPI.  Past Medical History  Diagnosis Date  . Lupus [facility])     rheumatologist - Dr. Estanislado Pandy  . Hypertension   . Leukopenia 06/16/2013  . Cancer of right breast ([place]) 09/03/13    Invasive Ductal Carcinoma/Ductal Carcinoma Insitu  biopsies  . Anxiety   . GERD (gastroesophageal reflux disease)     hx esophagitis  . Degenerative joint disease     back neck and shoulder  . Neuromuscular disorder ([place])     neuropathy from chemo in feet/hands  . S/P radiation therapy 04/27/2014-06/07/2014    1) Right breast, 50 Gy in 25 fractions/ 2) Right breast boost, 10 Gy in 5 fractions  . Fibromyalgia     Dr. Estanislado Pandy  . Migraines   . Hot flashes     Past Surgical History  Procedure Laterality Date  . Cesarean section      x 3  . Carpal tunnel release  1998    rt  . Trigger finger release  2004    rt thumb  . Portacath placement  08/2013    still currently active   . Tubal ligation    . Trigger finger release left thumb  06/28/14    [facility]  . Wisdom tooth extraction    . Breast surgery  03/01/2014    hx right breast cancer- 3 lymph nodes removed and lumpectomy  . Robotic assisted bilateral salpingo oopherectomy Bilateral 07/09/2014    Procedure: ROBOTIC ASSISTED BILATERAL SALPINGO OOPHORECTOM; :LYSIS OF ADHESIONS;  Surgeon: Marvene Staff, MD;  Location: [facility];  Service: Gynecology;  Laterality: Bilateral;    Family History  Problem Relation Age of Onset  . Hypertension Mother   . Dementia Mother     small vessel disease  . Cancer Mother 30   cervical  cancer  . Cancer Father 72    prostate ca  . Diabetes Father   . Arthritis Father     Social History   Social History  . Marital Status: Married    Spouse Name: N/A  . Number of Children: 1  . Years of Education: N/A   Occupational History  .      homemaker   Social History Main Topics  . Smoking status: Former Smoker -- 1.00 packs/day for 15 years    Types: Cigars, Cigarettes    Quit date: 02/23/2004  . Smokeless tobacco: Never Used     Comment: remote smoking history  . Alcohol Use: 0.0 oz/week    0 Shots of liquor per week     Comment: occ   . Drug Use: Yes     Comment: Marjuana  . Sexual Activity: Yes   Other Topics Concern  . 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  aspirin 81 MG tablet, Take 81 mg by mouth daily., Disp: , Rfl:  .  b complex vitamins capsule, Take 1 capsule by mouth daily., Disp: , Rfl:  .  BIOTIN 5000 PO, Take 1 capsule by mouth daily. , Disp: , Rfl:  .  Calcium Carbonate-Vit D-Min (CALTRATE 600+D PLUS MINERALS PO), Take 1 tablet by mouth daily., Disp: , Rfl:  .  exemestane (AROMASIN) 25 MG tablet, , Disp: , Rfl:  .  gabapentin (NEURONTIN) 300 MG capsule, Take 1 capsule (300 mg total) by mouth 3 (three) times daily., Disp: 270 capsule, Rfl: 3 .  hydroxychloroquine (PLAQUENIL) 200 MG tablet, Take 200 mg by mouth daily., Disp: , Rfl:  .  lisinopril-hydrochlorothiazide (PRINZIDE,ZESTORETIC) 20-12.5 MG tablet, TAKE 1 TABLET DAILY, Disp: 90 tablet, Rfl: 1 .  loratadine (CLARITIN) 10 MG tablet, Take 10 mg by mouth daily., Disp: , Rfl:  .  MAGNESIUM PO, Take by mouth daily., Disp: , Rfl:  .  omeprazole (PRILOSEC) 40 MG capsule, Take 1 capsule (40 mg total) by mouth daily., Disp: 30  capsule, Rfl: 4 .  SUMAtriptan (IMITREX) 25 MG tablet, Take 1 tablet (25 mg total) by mouth every 2 (two) hours as needed for migraine. May repeat in 2 hours if needed. Do not take more then 2 tablets in 24 hours., Disp: 10 tablet, Rfl: 0 .  tamoxifen (NOLVADEX) 20 MG tablet, Take 1 tablet (20 mg total) by mouth daily., Disp: 90 tablet, Rfl: 3 .  tiZANidine (ZANAFLEX) 4 MG tablet, Take 1 tablet (4 mg total) by mouth every 6 (six) hours as needed for muscle spasms., Disp: 30 tablet, Rfl: 6 .  TURMERIC PO, Take by mouth daily., Disp: , Rfl:  .  venlafaxine XR (EFFEXOR-XR) 75 MG 24 hr capsule, Take 1 capsule (75 mg total) by mouth daily with breakfast., Disp: 90 capsule, Rfl: 2 .  VITAMIN E EX, Apply topically. Vaginal suppository, Disp: , Rfl:  .  amoxicillin-clavulanate (AUGMENTIN) 875-125 MG tablet, Take 1 tablet by mouth 2 (two) times daily., Disp: 20 tablet, Rfl: 0 .  benzonatate (TESSALON) 100 MG capsule, Take 1 capsule (100 mg total) by mouth 2 (two) times daily as needed for cough., Disp: 20 capsule, Rfl: 0 No current facility-administered medications for this visit.  Facility-Administered Medications Ordered in Other Visits:  .  prochlorperazine (COMPAZINE) injection 10 mg, 10 mg, Intravenous, Q6H PRN, Minette Headland, NP, 10 mg at 12/26/13 0917  EXAM:  Filed Vitals:   01/05/16 0926  Pulse: 86  Temp: 98.8 F (37.1 C)    Body mass index is 31.22 kg/(m^2).  GENERAL: vitals reviewed and listed above, alert, oriented, appears well hydrated and in no acute distress  HEENT: atraumatic, conjunttiva clear, no obvious abnormalities on inspection of external nose and ears, normal appearance of ear canals and TMs except clear effusion R, clear nasal congestion, mild post oropharyngeal erythema with PND, no tonsillar edema or exudate, no sinus TTP  NECK: no obvious masses on inspection  LUNGS: clear to auscultation bilaterally, no wheezes, rales or rhonchi, good air movement  CV: HRRR,  no peripheral edema  MS: moves all extremities without noticeable abnormality  PSYCH: pleasant and cooperative, no obvious depression or anxiety  ASSESSMENT AND PLAN:  Discussed the following assessment and plan:  Acute upper respiratory infection  -given HPI and exam findings today, a serious infection or illness is unlikely. We discussed potential etiologies, with VURI being most likely, and advised supportive care and  monitoring. Given immunocompromised state, rx for abx and signs symptoms of developing bacterial infection discussed. We discussed treatment side effects, likely course, antibiotic misuse, transmission, and signs of developing a serious illness. -form to assistant to complete -of course, we advised to return or notify a doctor immediately if symptoms worsen or persist or new concerns arise.    Patient Instructions  INSTRUCTIONS FOR UPPER RESPIRATORY INFECTION:  -use the Augmentin, antibiotic if worsening, fevers, not improving as expected  -nasal saline wash 2-3 times daily (use prepackaged nasal saline or bottled/distilled water if making your own)   -can use AFRIN nasal spray for drainage and nasal congestion - but do NOT use longer then 3-4 days  -can use tylenol (in no history of liver disease) o as directed for aches and sorethroat  -if you are taking a cough medication - use only as directed, may also try a teaspoon of honey to coat the throat and throat lozenges.   -for sore throat, salt water gargles can help  -follow up if you have fevers, facial pain, tooth pain, difficulty breathing or are worsening or symptoms persist longer then expected  Upper Respiratory Infection, Adult An upper respiratory infection (URI) is also known as the common cold. It is often caused by a type of germ (virus). Colds are easily spread (contagious). You can pass it to others by kissing, coughing, sneezing, or drinking out of the same glass. Usually, you get better in 1 to 3   weeks.  However, the cough can last for even longer. HOME CARE   Only take medicine as told by your doctor. Follow instructions provided above.  Drink enough water and fluids to keep your pee (urine) clear or pale yellow.  Get plenty of rest.  Return to work when your temperature is < 100 for 24 hours or as told by your doctor. You may use a face mask and wash your hands to stop your cold from spreading. GET HELP RIGHT AWAY IF:   After the first few days, you feel you are getting worse.  You have questions about your medicine.  You have chills, shortness of breath, or red spit (mucus).  You have pain in the face for more then 1-2 days, especially when you bend forward.  You have a fever, puffy (swollen) neck, pain when you swallow, or white spots in the back of your throat.  You have a bad headache, ear pain, sinus pain, or chest pain.  You have a high-pitched whistling sound when you breathe in and out (wheezing).  You cough up blood.  You have sore muscles or a stiff neck. MAKE SURE YOU:   Understand these instructions.  Will watch your condition.  Will get help right away if you are not doing well or get worse. Document Released: 01/02/2008 Document Revised: 10/08/2011 Document Reviewed: 10/21/2013 [facility] Patient Information 2015 [place], [place]. This information is not intended to replace advice given to you by your health care provider. Make sure you discuss any questions you have with your health care provider.      Colin Benton R.

## 2016-01-05 NOTE — Patient Instructions (Signed)
INSTRUCTIONS FOR UPPER RESPIRATORY INFECTION:  -use the Augmentin, antibiotic if worsening, fevers, not improving as expected  -nasal saline wash 2-3 times daily (use prepackaged nasal saline or bottled/distilled water if making your own)   -can use AFRIN nasal spray for drainage and nasal congestion - but do NOT use longer then 3-4 days  -can use tylenol (in no history of liver disease) o as directed for aches and sorethroat  -if you are taking a cough medication - use only as directed, may also try a teaspoon of honey to coat the throat and throat lozenges.   -for sore throat, salt water gargles can help  -follow up if you have fevers, facial pain, tooth pain, difficulty breathing or are worsening or symptoms persist longer then expected  Upper Respiratory Infection, Adult An upper respiratory infection (URI) is also known as the common cold. It is often caused by a type of germ (virus). Colds are easily spread (contagious). You can pass it to others by kissing, coughing, sneezing, or drinking out of the same glass. Usually, you get better in 1 to 3  weeks.  However, the cough can last for even longer. HOME CARE   Only take medicine as told by your doctor. Follow instructions provided above.  Drink enough water and fluids to keep your pee (urine) clear or pale yellow.  Get plenty of rest.  Return to work when your temperature is < 100 for 24 hours or as told by your doctor. You may use a face mask and wash your hands to stop your cold from spreading. GET HELP RIGHT AWAY IF:   After the first few days, you feel you are getting worse.  You have questions about your medicine.  You have chills, shortness of breath, or red spit (mucus).  You have pain in the face for more then 1-2 days, especially when you bend forward.  You have a fever, puffy (swollen) neck, pain when you swallow, or white spots in the back of your throat.  You have a bad headache, ear pain, sinus pain, or chest  pain.  You have a high-pitched whistling sound when you breathe in and out (wheezing).  You cough up blood.  You have sore muscles or a stiff neck. MAKE SURE YOU:   Understand these instructions.  Will watch your condition.  Will get help right away if you are not doing well or get worse. Document Released: 01/02/2008 Document Revised: 10/08/2011 Document Reviewed: 10/21/2013 [facility] Patient Information 2015 [place], [place]. This information is not intended to replace advice given to you by your health care provider. Make sure you discuss any questions you have with your health care provider.

## 2016-01-05 NOTE — Progress Notes (Signed)
Pre visit review using our clinic review tool, if applicable. No additional management support is needed unless otherwise documented below in the visit note. 

## 2016-01-11 DIAGNOSIS — M2550 Pain in unspecified joint: Secondary | ICD-10-CM | POA: Diagnosis not present

## 2016-01-11 DIAGNOSIS — Z79899 Other long term (current) drug therapy: Secondary | ICD-10-CM | POA: Diagnosis not present

## 2016-01-11 DIAGNOSIS — M359 Systemic involvement of connective tissue, unspecified: Secondary | ICD-10-CM | POA: Diagnosis not present

## 2016-01-17 DIAGNOSIS — K5730 Diverticulosis of large intestine without perforation or abscess without bleeding: Secondary | ICD-10-CM | POA: Diagnosis not present

## 2016-01-17 DIAGNOSIS — K219 Gastro-esophageal reflux disease without esophagitis: Secondary | ICD-10-CM | POA: Diagnosis not present

## 2016-01-24 ENCOUNTER — Ambulatory Visit (INDEPENDENT_AMBULATORY_CARE_PROVIDER_SITE_OTHER): Payer: BLUE CROSS/BLUE SHIELD | Admitting: Family Medicine

## 2016-01-24 ENCOUNTER — Encounter: Payer: Self-pay | Admitting: Family Medicine

## 2016-01-24 VITALS — BP 118/74 | HR 83 | Temp 98.3°F | Ht 64.0 in | Wt 181.2 lb

## 2016-01-24 DIAGNOSIS — M5136 Other intervertebral disc degeneration, lumbar region: Secondary | ICD-10-CM | POA: Diagnosis not present

## 2016-01-24 DIAGNOSIS — IMO0002 Reserved for concepts with insufficient information to code with codable children: Secondary | ICD-10-CM

## 2016-01-24 DIAGNOSIS — M545 Low back pain, unspecified: Secondary | ICD-10-CM

## 2016-01-24 DIAGNOSIS — M329 Systemic lupus erythematosus, unspecified: Secondary | ICD-10-CM

## 2016-01-24 DIAGNOSIS — M51369 Other intervertebral disc degeneration, lumbar region without mention of lumbar back pain or lower extremity pain: Secondary | ICD-10-CM

## 2016-01-24 DIAGNOSIS — M5441 Lumbago with sciatica, right side: Secondary | ICD-10-CM | POA: Diagnosis not present

## 2016-01-24 DIAGNOSIS — G8929 Other chronic pain: Secondary | ICD-10-CM

## 2016-01-24 HISTORY — DX: Low back pain, unspecified: M54.50

## 2016-01-24 HISTORY — DX: Other chronic pain: G89.29

## 2016-01-24 MED ORDER — CYCLOBENZAPRINE HCL 5 MG PO TABS: 5.0000 mg | ORAL_TABLET | Freq: Every evening | ORAL | Status: DC | PRN

## 2016-01-24 NOTE — Progress Notes (Signed)
HPI:  Tina [name] is a pleasant 55 year old here for an acute visit for back pain: -History of lupus and degenerative disc disease and chronic low back pain -Pain started over a years ago, intermittent, unchanged  -Flares of right sided low back pain, moderate to severe, worsened by certain movements, sometimes radiating to the right buttock and lower extremity, sometimes pain in the right hip socket region -This flare started 2 weeks ago, not radiating as much as prior episodes -No fever, malaise, weight loss, weakness, numbness or bowel or bladder incontinence -Reports has seen Dr. Lorin Mercy in orthopedics in the past and had injections (made it worse), also has seen an acupuncturist which did not help, physical therapy and trigger technique did help in the past, currently out of physical therapy allowance for the year -She sees rheumatologist for the lupus and is on Actonel, gabapentin, and Effexor -had MRI and hip plain films with Dr. Lorin Mercy and Dr. Leonides Schanz in 2015 for same   ROS: See pertinent positives and negatives per HPI.  Past Medical History  Diagnosis Date  . Lupus [facility])     rheumatologist - Dr. Estanislado Pandy  . Hypertension   . Leukopenia 06/16/2013  . Cancer of right breast ([place]) 09/03/13    Invasive Ductal Carcinoma/Ductal Carcinoma Insitu  biopsies  . Anxiety   . GERD (gastroesophageal reflux disease)     hx esophagitis  . Degenerative joint disease     back neck and shoulder  . 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 S/P radiation therapy 04/27/2014-06/07/2014    1) Right breast, 50 Gy in 25 fractions/ 2) Right breast boost, 10 Gy in 5 fractions  . Fibromyalgia     Dr. Estanislado Pandy  . Migraines   . Hot flashes   . Chronic low back pain 01/24/2016    -and R hip pain -seen by rheumatologist and ortho in the past -MRI with Dr. Lorin Mercy in 2015, R hip plain films in 2015 as well acupuncture, injections, tramadol not helpful [facility] helpful     Past Surgical History  Procedure Laterality Date  . Cesarean section      x 3  . Carpal tunnel release  1998    rt  . Trigger finger release  2004    rt thumb  . Portacath placement  08/2013    still currently active   . Tubal ligation    . Trigger finger release left thumb  06/28/14    at orthopedic surgical center  . Wisdom tooth extraction    . Breast surgery  03/01/2014    hx right breast cancer- 3 lymph nodes removed and lumpectomy  . Robotic assisted bilateral salpingo oopherectomy Bilateral 07/09/2014    Procedure: ROBOTIC ASSISTED BILATERAL SALPINGO OOPHORECTOM; :LYSIS OF ADHESIONS;  Surgeon: Marvene Staff, MD;  Location: [facility];  Service: Gynecology;  Laterality: Bilateral;    Family History  Problem Relation Age of Onset  . Hypertension Mother   . Dementia Mother     small vessel disease  . Cancer Mother 30    cervical  cancer  . Cancer Father 58    prostate ca  . Diabetes Father   . Arthritis Father     Social History   Social History  . Marital Status: Married    Spouse Name: N/A  . Number of Children: 1  . Years of Education: N/A   Occupational History  .  homemaker   Social History Main Topics  . Smoking status: Former Smoker -- 1.00 packs/day for 15 years    Types: Cigars, Cigarettes    Quit date: 02/23/2004  . Smokeless tobacco: Never Used     Comment: remote smoking history  . Alcohol Use: 0.0 oz/week    0 Shots of liquor per week     Comment: occ   . Drug Use: Yes     Comment: Marjuana  . Sexual Activity: Yes   Other Topics Concern  . 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      Home Situation: lives with husband and 53 yo son      Spiritual Beliefs: Christian      Lifestyle: elliptical 3-4 times per week; working on diet as well over last year in 2014              Current outpatient prescriptions:  .  aspirin 81 MG tablet, Take 81 mg by mouth daily.,  Disp: , Rfl:  .  b complex vitamins capsule, Take 1 capsule by mouth daily., Disp: , Rfl:  .  BIOTIN 5000 PO, Take 1 capsule by mouth daily. , Disp: , Rfl:  .  Calcium Carbonate-Vit D-Min (CALTRATE 600+D PLUS MINERALS PO), Take 1 tablet by mouth daily., Disp: , Rfl:  .  DEXILANT 60 MG capsule, , Disp: , Rfl:  .  exemestane (AROMASIN) 25 MG tablet, , Disp: , Rfl:  .  gabapentin (NEURONTIN) 300 MG capsule, Take 1 capsule (300 mg total) by mouth 3 (three) times daily., Disp: 270 capsule, Rfl: 3 .  hydroxychloroquine (PLAQUENIL) 200 MG tablet, Take 200 mg by mouth daily., Disp: , Rfl:  .  lisinopril-hydrochlorothiazide (PRINZIDE,ZESTORETIC) 20-12.5 MG tablet, TAKE 1 TABLET DAILY, Disp: 90 tablet, Rfl: 1 .  loratadine (CLARITIN) 10 MG tablet, Take 10 mg by mouth daily., Disp: , Rfl:  .  MAGNESIUM PO, Take by mouth daily., Disp: , Rfl:  .  SUMAtriptan (IMITREX) 25 MG tablet, Take 1 tablet (25 mg total) by mouth every 2 (two) hours as needed for migraine. May repeat in 2 hours if needed. Do not take more then 2 tablets in 24 hours., Disp: 10 tablet, Rfl: 0 .  tamoxifen (NOLVADEX) 20 MG tablet, Take 1 tablet (20 mg total) by mouth daily., Disp: 90 tablet, Rfl: 3 .  TURMERIC PO, Take by mouth daily., Disp: , Rfl:  .  venlafaxine XR (EFFEXOR-XR) 75 MG 24 hr capsule, Take 1 capsule (75 mg total) by mouth daily with breakfast., Disp: 90 capsule, Rfl: 2 .  VITAMIN E EX, Apply topically. Vaginal suppository, Disp: , Rfl:  .  cyclobenzaprine (FLEXERIL) 5 MG tablet, Take 1 tablet (5 mg total) by mouth at bedtime as needed for muscle spasms. Not more then twice daily as needed., Disp: 30 tablet, Rfl: 0 No current facility-administered medications for this visit.  Facility-Administered Medications Ordered in Other Visits:  .  prochlorperazine (COMPAZINE) injection 10 mg, 10 mg, Intravenous, Q6H PRN, Minette Headland, NP, 10 mg at 12/26/13 0917  EXAM:  Filed Vitals:   01/24/16 1305  BP: 118/74  Pulse: 83   Temp: 98.3 F (36.8 C)    Body mass index is 31.09 kg/(m^2).  GENERAL: vitals reviewed and listed above, alert, oriented, appears well hydrated and in no acute distress  HEENT: atraumatic, conjunttiva clear, no obvious abnormalities on inspection of external nose and ears  NECK: no obvious masses on inspection  LUNGS: clear to auscultation bilaterally, no wheezes, rales or rhonchi, good air movement  CV: HRRR, no peripheral edema  MS: moves all extremities without noticeable abnormality Normal Gait Normal inspection of back, no obvious scoliosis or leg length descrepancy No bony TTP Soft tissue TTP at: R lower lumbar paraspinal muscles -/+ tests: neg trendelenburg,-facet loading, -SLRT, -CLRT, -FABER, -FADIR Normal muscle strength, sensation to light touch and DTRs in LEs bilaterally  PSYCH: pleasant and cooperative, no obvious depression or anxiety  ASSESSMENT AND PLAN:  Discussed the following assessment and plan:  Right-sided low back pain with right-sided sciatica  Lupus (HCC)  DDD (degenerative disc disease), lumbar  Chronic low back pain  -we discussed possible serious and likely etiologies, workup and treatment, treatment risks and return precautions - no neurodeficits on exam, chronic issues. Unfortunate she is out of PT visits as benefited in the past.  -after this discussion, Shantal opted for HEP, flexeril, heat, topical sports creams, tylenol prn pain -can't take nsaids, discussed risks and potential for poor outcomes with opiod use for chronic pain and she does not wish to use which is a wise decision -follow up advised 1 month, consider R hip xray, repeat imaging, PMR referral if persistent or worsening symptoms -of course, we advised [place]  to return or notify a doctor immediately if symptoms worsen or persist or new concerns arise.   Patient Instructions  BEFORE YOU LEAVE: -low back exercises -follow up in 1 month  Do the exercises at least 4 days per  week.  Use caution with any yoga poses that worsen pain.  Muscle relaxer at night with flare and as needed if helps.  Heat, topical sports creams with capsaicin or menthol, tylenol per instructions as needed for pain.         Colin Benton R.

## 2016-01-24 NOTE — Patient Instructions (Signed)
BEFORE YOU LEAVE: -low back exercises -follow up in 1 month  Do the exercises at least 4 days per week.  Use caution with any yoga poses that worsen pain.  Muscle relaxer at night with flare and as needed if helps.  Heat, topical sports creams with capsaicin or menthol, tylenol per instructions as needed for pain.

## 2016-01-25 DIAGNOSIS — M25551 Pain in right hip: Secondary | ICD-10-CM | POA: Diagnosis not present

## 2016-01-25 DIAGNOSIS — M533 Sacrococcygeal disorders, not elsewhere classified: Secondary | ICD-10-CM | POA: Diagnosis not present

## 2016-02-20 DIAGNOSIS — M25551 Pain in right hip: Secondary | ICD-10-CM | POA: Diagnosis not present

## 2016-02-24 ENCOUNTER — Ambulatory Visit: Payer: Self-pay | Admitting: Family Medicine

## 2016-02-25 ENCOUNTER — Other Ambulatory Visit: Payer: Self-pay | Admitting: Orthopaedic Surgery

## 2016-02-25 DIAGNOSIS — M25551 Pain in right hip: Secondary | ICD-10-CM

## 2016-02-27 NOTE — Progress Notes (Signed)
HPI:  HTN: -meds:lisinopril-hctz 20-12.5, asa 81 mg  -she did not take her medication this morning -lifestyle: water aerobics, trying to eat healthy -denies: CP, SOB, DOE  GERD/chronic constipation: -meds: dexilant 60 mg -denies: worsening, dysphagia, stricture in the past -did have esophagitis in the past -take mirilax and colace for the constipation  Chronic low back and hip pain: -ok currently -hx DDD and lupus (sees rheumatologist) -has seen ortho, acupuncturist, Dr. Lorin Mercy and Dr. Leonides Schanz -has done PT  -intermittent flares of pain in R low back, sometimes radiating to R buttock and hip  Cervicogenic headaches and facial pain/Migraines: -seeing neurology, rheum and ortho for this -MRI head and neck with some DDD -meds: venlafaxine, neuronitin, prn triptan, tramadol muscle relaxer -reports: doing a little better  ? Fibromyalgia and Lupus: -seeing Dr. Estanislado Pandy  -meds: plaquenil, neurontin, tizanidine, venlafaxine -reports is feeling better overall but does have some muscle spasm in traps at times  Anxiety/hot flashes: -neurology started venlafaxine for this and her other issues  Hx breast ca: -R breast -sees Oncologist, Dr. Loleta Dicker -s/p lumpectomy, chemo, radiation in 2015, on aromasin in the past, now changing to tamoxifen  ROS: See pertinent positives and negatives per HPI.  Past Medical History:  Diagnosis Date  . Anxiety   . Cancer of right breast ([place]) 09/03/13   Invasive Ductal Carcinoma/Ductal Carcinoma Insitu  biopsies  . Chronic low back pain 01/24/2016   -and R hip pain -seen by rheumatologist and ortho in the past -MRI with Dr. Lorin Mercy in 2015, R hip plain films in 2015 as well acupuncture, injections, tramadol not helpful [facility] helpful   . Degenerative joint disease    back neck and shoulder  . Fibromyalgia    Dr. Estanislado Pandy  . GERD (gastroesophageal reflux disease)    hx esophagitis  . Hot flashes   .  Hypertension   . Leukopenia 06/16/2013  . Lupus [facility])    rheumatologist - Dr. Estanislado Pandy  . Migraines   . 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 S/P radiation therapy 04/27/2014-06/07/2014   1) Right breast, 50 Gy in 25 fractions/ 2) Right breast boost, 10 Gy in 5 fractions    Past Surgical History:  Procedure Laterality Date  . BREAST SURGERY  03/01/2014   hx right breast cancer- 3 lymph nodes removed and lumpectomy  . [place]   rt  . CESAREAN SECTION     x 3  . PORTACATH PLACEMENT  08/2013   still currently active   . ROBOTIC ASSISTED BILATERAL SALPINGO OOPHERECTOMY Bilateral 07/09/2014   Procedure: ROBOTIC ASSISTED BILATERAL SALPINGO OOPHORECTOM; :LYSIS OF ADHESIONS;  Surgeon: Marvene Staff, MD;  Location: [facility];  Service: Gynecology;  Laterality: Bilateral;  . TRIGGER FINGER RELEASE  2004   rt thumb  . trigger finger release left thumb  06/28/14   at [facility] center  . TUBAL LIGATION    . WISDOM TOOTH EXTRACTION      Family History  Problem Relation Age of Onset  . Hypertension Mother   . Dementia Mother     small vessel disease  . Cancer Mother 30    cervical  cancer  . Cancer Father 37    prostate ca  . Diabetes Father   . Arthritis Father     Social History   Social History  . Marital status: Married    Spouse name: N/A  . Number of children: 1  .  Years of education: N/A   Occupational History  .      homemaker   Social History Main Topics  . Smoking status: Former Smoker    Packs/day: 1.00    Years: 15.00    Types: Cigars, Cigarettes    Quit date: 02/23/2004  . Smokeless tobacco: Never Used     Comment: remote smoking history  . Alcohol use 0.0 oz/week     Comment: occ   . Drug use:      Comment: Marjuana  . Sexual activity: Yes   Other Topics Concern  . 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  aspirin 81 MG tablet, Take 81 mg by mouth daily., Disp: , Rfl:  .  b complex vitamins capsule, Take 1 capsule by mouth daily., Disp: , Rfl:  .  BIOTIN 5000 PO, Take 1 capsule by mouth daily. , Disp: , Rfl:  .  Calcium Carbonate-Vit D-Min (CALTRATE 600+D PLUS MINERALS PO), Take 1 tablet by mouth daily., Disp: , Rfl:  .  cyclobenzaprine (FLEXERIL) 5 MG tablet, Take 1 tablet (5 mg total) by mouth at bedtime as needed for muscle spasms. Not more then twice daily as needed., Disp: 30 tablet, Rfl: 0 .  DEXILANT 60 MG capsule, , Disp: , Rfl:  .  Docusate Calcium (STOOL SOFTENER PO), Take by mouth as needed., Disp: , Rfl:  .  exemestane (AROMASIN) 25 MG tablet, , Disp: , Rfl:  .  gabapentin (NEURONTIN) 300 MG capsule, Take 1 capsule (300 mg total) by mouth 3 (three) times daily., Disp: 270 capsule, Rfl: 3 .  hydroxychloroquine (PLAQUENIL) 200 MG tablet, Take 200 mg by mouth daily., Disp: , Rfl:  .  lisinopril-hydrochlorothiazide (PRINZIDE,ZESTORETIC) 20-12.5 MG tablet, TAKE 1 TABLET DAILY, Disp: 90 tablet, Rfl: 1 .  loratadine (CLARITIN) 10 MG tablet, Take 10 mg by mouth daily., Disp: , Rfl:  .  MAGNESIUM PO, Take by mouth daily., Disp: , Rfl:  .  Polyethylene Glycol 3350 (MIRALAX PO), Take by mouth as needed., Disp: , Rfl:  .  SUMAtriptan (IMITREX) 25 MG tablet, Take 1 tablet (25 mg total) by mouth every 2 (two) hours as needed for migraine. May repeat in 2 hours if needed. Do not take more then 2 tablets in 24 hours., Disp: 10 tablet, Rfl: 0 .  tamoxifen (NOLVADEX) 20 MG tablet, Take 1 tablet (20 mg total) by mouth daily., Disp: 90 tablet, Rfl: 3 .  TURMERIC PO, Take by mouth daily., Disp: , Rfl:  .  venlafaxine XR (EFFEXOR-XR) 75 MG 24 hr capsule, Take 1 capsule (75 mg total) by mouth daily with breakfast., Disp: 90  capsule, Rfl: 2 .  VITAMIN E EX, Apply topically. Vaginal suppository, Disp: , Rfl:  No current facility-administered medications for this visit.   Facility-Administered Medications Ordered in Other Visits:  .  prochlorperazine (COMPAZINE) injection 10 mg, 10 mg, Intravenous, Q6H PRN, Minette Headland, NP, 10 mg at 12/26/13 0917  EXAM:  Vitals:   02/28/16 1109  BP: 118/88  Pulse: 88  Temp: 98.8 F (37.1 C)    Body mass index is  31.22 kg/m.  GENERAL: vitals reviewed and listed above, alert, oriented, appears well hydrated and in no acute distress  HEENT: atraumatic, conjunttiva clear, no obvious abnormalities on inspection of external nose and ears  NECK: no obvious masses on inspection  LUNGS: clear to auscultation bilaterally, no wheezes, rales or rhonchi, good air movement  CV: HRRR, no peripheral edema  MS: moves all extremities without noticeable abnormality  PSYCH: pleasant and cooperative, no obvious depression or anxiety  ASSESSMENT AND PLAN:  Discussed the following assessment and plan:  Muscle spasm Fibromyalgia -seeing rheum and neurology -cont current tx, trial topical menthol in addition to other treatments  Constipation, unspecified constipation type -add daily fiber supplement, mirilax prn  Essential hypertension Advised to take bp med in the morning  Lupus ([place]) Managed by rheum  Gastroesophageal reflux disease, esophagitis presence not specified Stable  -Patient advised to return or notify a doctor immediately if symptoms worsen or persist or new concerns arise.  Patient Instructions  BEFORE YOU LEAVE: -follow up: 3 months  Try tiger balm on the shoulders.  Metameucil or other fiber supplement daily before breakfast. Use the mirilax as needed if you become stopped up.  Take you blood pressure medications every morning.    Colin Benton R., DO

## 2016-02-28 ENCOUNTER — Encounter: Payer: Self-pay | Admitting: Family Medicine

## 2016-02-28 ENCOUNTER — Ambulatory Visit (INDEPENDENT_AMBULATORY_CARE_PROVIDER_SITE_OTHER): Payer: BLUE CROSS/BLUE SHIELD | Admitting: Family Medicine

## 2016-02-28 VITALS — BP 118/88 | HR 88 | Temp 98.8°F | Ht 64.0 in | Wt 181.9 lb

## 2016-02-28 DIAGNOSIS — K5900 Constipation, unspecified: Secondary | ICD-10-CM

## 2016-02-28 DIAGNOSIS — I10 Essential (primary) hypertension: Secondary | ICD-10-CM

## 2016-02-28 DIAGNOSIS — M329 Systemic lupus erythematosus, unspecified: Secondary | ICD-10-CM | POA: Diagnosis not present

## 2016-02-28 DIAGNOSIS — M797 Fibromyalgia: Secondary | ICD-10-CM | POA: Insufficient documentation

## 2016-02-28 DIAGNOSIS — M62838 Other muscle spasm: Secondary | ICD-10-CM

## 2016-02-28 DIAGNOSIS — K219 Gastro-esophageal reflux disease without esophagitis: Secondary | ICD-10-CM

## 2016-02-28 NOTE — Patient Instructions (Signed)
BEFORE YOU LEAVE: -follow up: 3 months  Try tiger balm on the shoulders.  Metameucil or other fiber supplement daily before breakfast. Use the mirilax as needed if you become stopped up.  Take you blood pressure medications every morning.

## 2016-02-28 NOTE — Progress Notes (Signed)
Pre visit review using our clinic review tool, if applicable. No additional management support is needed unless otherwise documented below in the visit note. 

## 2016-03-01 ENCOUNTER — Encounter: Payer: Self-pay | Admitting: Family Medicine

## 2016-03-01 ENCOUNTER — Telehealth: Payer: Self-pay | Admitting: Family Medicine

## 2016-03-01 NOTE — Telephone Encounter (Signed)
° °  Pt said the following med is not helping her back spasms  cyclobenzaprine (FLEXERIL) 5 MG tablet   She is asking if she can have a rx for prednizone or anything else that the doctor think may help    Pharmacy [facility] [address]

## 2016-03-02 MED ORDER — TIZANIDINE HCL 2 MG PO TABS: 2.0000 mg | ORAL_TABLET | Freq: Three times a day (TID) | ORAL | 0 refills | Status: DC | PRN

## 2016-03-02 NOTE — Telephone Encounter (Signed)
I called the pt and informed her of the message below and she stated it is not her shoulder and this is her severe low back and right hip pain for the past day.  States she has spasms every time she moves, especially with weight bearing and muscle relaxers do not help.  States she called her ortho doctor, she has an appt on Monday and they called in Tylenol #3.

## 2016-03-02 NOTE — Telephone Encounter (Signed)
I think she is referring to the pain in the L upper shoulder? I am sorry she is not feeling well. I think this was most likely muscular/fibro related and prednisone would not likely help. Could try topical menthol and capsaicin, heat and could try a different muscle relaxer for spasm if the flexeril did not help. I cancelled the flexeril and sent zanaflex to try. If still not helping over the next few weeks would follow up or advise seeing her rheumatologist. Thanks.

## 2016-03-02 NOTE — Telephone Encounter (Signed)
Noted. So sorry she is going through this. I am glad she contacted her specialist and has appt. Would follow their recs.

## 2016-03-05 DIAGNOSIS — R300 Dysuria: Secondary | ICD-10-CM | POA: Diagnosis not present

## 2016-03-05 DIAGNOSIS — M545 Low back pain: Secondary | ICD-10-CM | POA: Diagnosis not present

## 2016-03-05 DIAGNOSIS — M2550 Pain in unspecified joint: Secondary | ICD-10-CM | POA: Diagnosis not present

## 2016-03-07 ENCOUNTER — Ambulatory Visit
Admission: RE | Admit: 2016-03-07 | Discharge: 2016-03-07 | Disposition: A | Discharge status: Home / Self Care | Payer: BLUE CROSS/BLUE SHIELD | Source: Ambulatory Visit | Attending: Orthopaedic Surgery | Admitting: Orthopaedic Surgery

## 2016-03-07 DIAGNOSIS — M25551 Pain in right hip: Secondary | ICD-10-CM

## 2016-03-08 DIAGNOSIS — M797 Fibromyalgia: Secondary | ICD-10-CM | POA: Diagnosis not present

## 2016-03-08 DIAGNOSIS — M359 Systemic involvement of connective tissue, unspecified: Secondary | ICD-10-CM | POA: Diagnosis not present

## 2016-03-08 DIAGNOSIS — M545 Low back pain: Secondary | ICD-10-CM | POA: Diagnosis not present

## 2016-03-08 DIAGNOSIS — Z09 Encounter for follow-up examination after completed treatment for conditions other than malignant neoplasm: Secondary | ICD-10-CM | POA: Diagnosis not present

## 2016-03-12 DIAGNOSIS — M545 Low back pain: Secondary | ICD-10-CM | POA: Diagnosis not present

## 2016-03-12 DIAGNOSIS — M25551 Pain in right hip: Secondary | ICD-10-CM | POA: Diagnosis not present

## 2016-03-13 ENCOUNTER — Other Ambulatory Visit: Payer: Self-pay | Admitting: Orthopaedic Surgery

## 2016-03-13 DIAGNOSIS — M47816 Spondylosis without myelopathy or radiculopathy, lumbar region: Secondary | ICD-10-CM

## 2016-03-13 DIAGNOSIS — M545 Low back pain: Secondary | ICD-10-CM

## 2016-03-16 DIAGNOSIS — R319 Hematuria, unspecified: Secondary | ICD-10-CM | POA: Diagnosis not present

## 2016-03-23 ENCOUNTER — Ambulatory Visit
Admission: RE | Admit: 2016-03-23 | Discharge: 2016-03-23 | Disposition: A | Discharge status: Home / Self Care | Payer: BLUE CROSS/BLUE SHIELD | Source: Ambulatory Visit | Attending: Orthopaedic Surgery | Admitting: Orthopaedic Surgery

## 2016-03-23 DIAGNOSIS — M47816 Spondylosis without myelopathy or radiculopathy, lumbar region: Secondary | ICD-10-CM

## 2016-03-23 DIAGNOSIS — M5127 Other intervertebral disc displacement, lumbosacral region: Secondary | ICD-10-CM | POA: Diagnosis not present

## 2016-03-23 DIAGNOSIS — M545 Low back pain: Secondary | ICD-10-CM

## 2016-03-27 DIAGNOSIS — M5137 Other intervertebral disc degeneration, lumbosacral region: Secondary | ICD-10-CM | POA: Diagnosis not present

## 2016-04-05 ENCOUNTER — Encounter: Payer: Self-pay | Admitting: Family Medicine

## 2016-04-05 DIAGNOSIS — L930 Discoid lupus erythematosus: Secondary | ICD-10-CM | POA: Diagnosis not present

## 2016-04-05 DIAGNOSIS — Z79899 Other long term (current) drug therapy: Secondary | ICD-10-CM | POA: Diagnosis not present

## 2016-04-05 DIAGNOSIS — H2513 Age-related nuclear cataract, bilateral: Secondary | ICD-10-CM | POA: Diagnosis not present

## 2016-04-05 DIAGNOSIS — H04123 Dry eye syndrome of bilateral lacrimal glands: Secondary | ICD-10-CM | POA: Diagnosis not present

## 2016-04-05 DIAGNOSIS — H16223 Keratoconjunctivitis sicca, not specified as Sjogren's, bilateral: Secondary | ICD-10-CM | POA: Diagnosis not present

## 2016-04-13 IMAGING — MR MR CERVICAL SPINE W/O CM
8 of 10 series · 34 of 48 positions shown · non-contrast
Comparison: None.

CLINICAL DATA: 2-3 years of neck pain and right arm pain, worsening
recently. Left arm numbness at night. Personal history of breast
cancer treated with radiation, chemotherapy and surgery in 5012.
Headache, right-sided.

EXAM:
MRI HEAD with and without WITHOUT CONTRAST
MRI CERVICAL SPINE WITHOUT CONTRAST
TECHNIQUE: Multiplanar, multiecho pulse sequences of the brain and surrounding
structures, and cervical spine, to include the craniocervical
junction and cervicothoracic junction, were obtained with then
without intravenous contrast. Cervical study is noncontrast only.

[Series 2: T1 · sagittal · 5.0mm · 0.90mm/px · 3 of 19 slices shown]
[im 1/19]
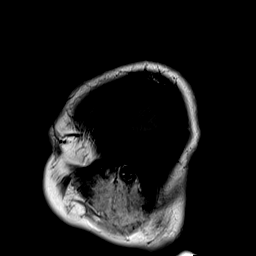
[im 10/19]
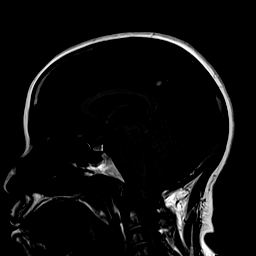
[im 19/19]
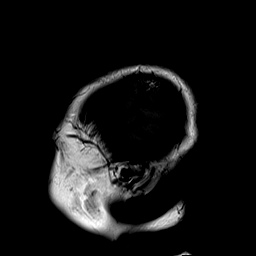

[Series 3: ep2d_diff_(id)_trace · axial · 3.0mm · 1.80mm/px · z∈[-89,+58]mm · 11 of 99 slices shown]
[im 1/99]
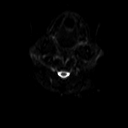
[im 10/99]
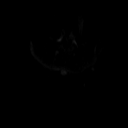
[im 20/99]
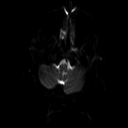
[im 30/99]
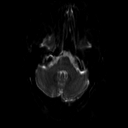
[im 40/99]
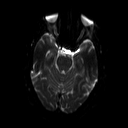
[im 50/99]
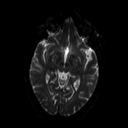
[im 59/99]
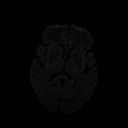
[im 69/99]
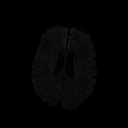
[im 79/99]
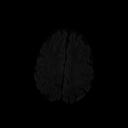
[im 89/99]
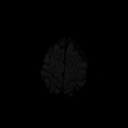
[im 99/99]
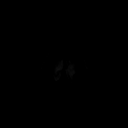

[Series 4: ep2d_diff_(id)_trace_adc · axial · 3.0mm · 1.80mm/px · z∈[-89,+58]mm · 5 of 48 slices shown]
[im 1/48]
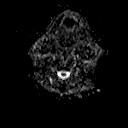
[im 12/48]
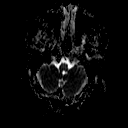
[im 24/48]
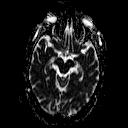
[im 36/48]
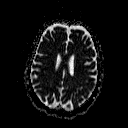
[im 48/48]
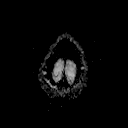

[Series 5: T2 · axial · 5.0mm · 0.60mm/px · z∈[-89,+58]mm · 2 of 22 slices shown (1 of 2)]
[im 1/22]
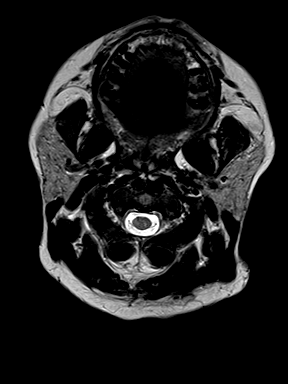
[im 22/22]
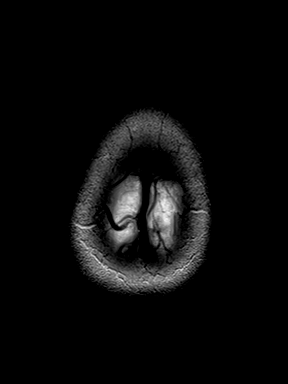

[Series 7: swi_images · axial · 2.0mm · 0.90mm/px · z∈[-87,+55]mm · 7 of 72 slices shown]
[im 1/72]
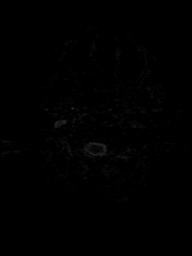
[im 12/72]
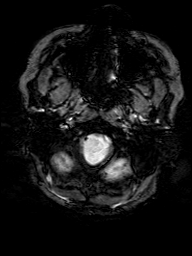
[im 24/72]
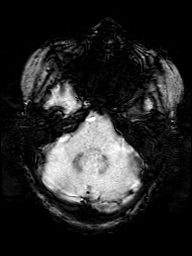
[im 36/72]
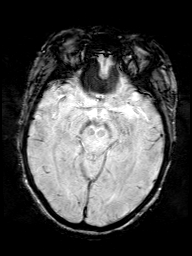
[im 48/72]
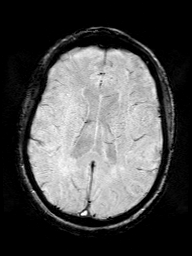
[im 60/72]
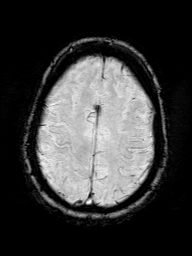
[im 72/72]
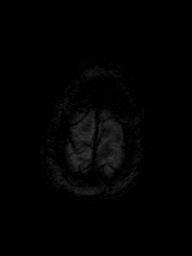

[Series 8: FLAIR · axial · 5.0mm · 0.90mm/px · z∈[-89,+58]mm · 2 of 22 slices shown]
[im 1/22]
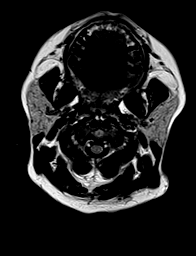
[im 22/22]
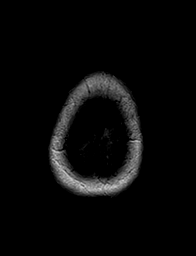

[Series 10: T2 · coronal · 5.0mm · 0.82mm/px · 2 of 22 slices shown (2 of 2)]
[im 1/22]
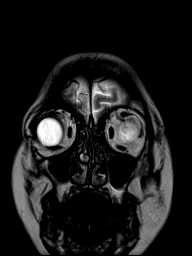
[im 22/22]
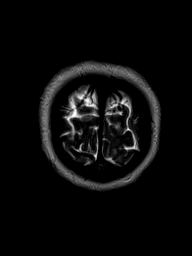

[Series 11: T1 post-contrast · coronal · 5.0mm · 0.82mm/px · 2 of 22 slices shown]
[im 1/22]
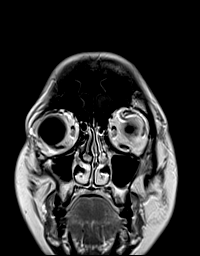
[im 22/22]
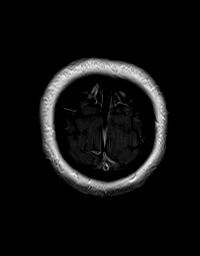

[34 of 48 positions shown; findings below may reference images not displayed]

FINDINGS: MRI HEAD FINDINGS

Diffusion imaging does not show any acute or subacute infarction.
The brainstem and cerebellum are normal. The cerebral hemispheres
show scattered foci of T2 and FLAIR signal within the white matter
consistent with minimal small vessel disease. No cortical or large
vessel territory abnormality. No evidence of mass lesion,
hemorrhage, hydrocephalus or extra-axial collection. No abnormal
enhancement occurs. No pituitary mass. No inflammatory sinus
disease. No skull or skullbase lesion.

MRI CERVICAL SPINE FINDINGS

There is no abnormality at the foramen magnum, C1-2 or C2-3.

C3-4: Small central disc protrusion indents the ventral subarachnoid
space but does not affect the cord or show foraminal extension.

C4-5: Central disc herniation effaces the ventral subarachnoid space
and contacts the ventral cord. No foraminal extension.

C5-6: Endplate osteophytes and shallow protrusion of disc material
cause narrowing of the ventral subarachnoid space. No compression of
the cord. Mild foraminal narrowing on the left.

C6-7: Endplate osteophytes and bulging of the disc indent the
ventral subarachnoid space. No effect upon the cord. Mild foraminal
narrowing bilaterally.

C7-T1: Shallow disc protrusion indents the ventral subarachnoid
space but does not affect the cord. No foraminal encroachment
IMPRESSION: MRI brain: No acute finding or explanation of the presenting
symptoms. Mild chronic appearing small vessel ischemic change of the
cerebral hemispheric white matter. No evidence of metastatic
disease.

Cervical spine MRI: Degenerative change from C3-4 through C7-T1. At
C4-5, there is a central disc herniation that contacts the ventral
aspect of the cord but does not frankly compress the cord or show
any cord edema. There is mild foraminal narrowing on the left at
C5-6 and bilaterally at C6-7, but no definite compression of the
exiting nerve roots. C7-T1 shows a shallow broad-based disc
herniation but without visible nerve compression.

## 2016-04-22 ENCOUNTER — Encounter: Payer: Self-pay | Admitting: *Deleted

## 2016-04-23 ENCOUNTER — Encounter: Payer: Self-pay | Admitting: *Deleted

## 2016-04-23 ENCOUNTER — Other Ambulatory Visit (HOSPITAL_BASED_OUTPATIENT_CLINIC_OR_DEPARTMENT_OTHER): Payer: BLUE CROSS/BLUE SHIELD

## 2016-04-23 ENCOUNTER — Encounter: Payer: Self-pay | Admitting: Hematology and Oncology

## 2016-04-23 ENCOUNTER — Ambulatory Visit (HOSPITAL_BASED_OUTPATIENT_CLINIC_OR_DEPARTMENT_OTHER): Payer: BLUE CROSS/BLUE SHIELD | Admitting: Hematology and Oncology

## 2016-04-23 DIAGNOSIS — D709 Neutropenia, unspecified: Secondary | ICD-10-CM

## 2016-04-23 DIAGNOSIS — C50511 Malignant neoplasm of lower-outer quadrant of right female breast: Secondary | ICD-10-CM

## 2016-04-23 DIAGNOSIS — D72819 Decreased white blood cell count, unspecified: Secondary | ICD-10-CM

## 2016-04-23 LAB — CBC WITH DIFFERENTIAL/PLATELET
BASO%: 1.4 % (ref 0.0–2.0)
BASOS ABS: 0 10*3/uL (ref 0.0–0.1)
EOS ABS: 0 10*3/uL (ref 0.0–0.5)
EOS%: 0.8 % (ref 0.0–7.0)
HEMATOCRIT: 37.2 % (ref 34.8–46.6)
HEMOGLOBIN: 12.7 g/dL (ref 11.6–15.9)
LYMPH#: 1.6 10*3/uL (ref 0.9–3.3)
LYMPH%: 55.3 % — ABNORMAL HIGH (ref 14.0–49.7)
MCH: 31.3 pg (ref 25.1–34.0)
MCHC: 34 g/dL (ref 31.5–36.0)
MCV: 92 fL (ref 79.5–101.0)
MONO#: 0.2 10*3/uL (ref 0.1–0.9)
MONO%: 6.7 % (ref 0.0–14.0)
NEUT#: 1.1 10*3/uL — ABNORMAL LOW (ref 1.5–6.5)
NEUT%: 35.8 % — ABNORMAL LOW (ref 38.4–76.8)
PLATELETS: 219 10*3/uL (ref 145–400)
RBC: 4.04 10*6/uL (ref 3.70–5.45)
RDW: 12.9 % (ref 11.2–14.5)
WBC: 2.9 10*3/uL — ABNORMAL LOW (ref 3.9–10.3)

## 2016-04-23 LAB — COMPREHENSIVE METABOLIC PANEL
ALBUMIN: 3.7 g/dL (ref 3.5–5.0)
ALK PHOS: 56 U/L (ref 40–150)
ALT: 21 U/L (ref 0–55)
AST: 26 U/L (ref 5–34)
Anion Gap: 9 mEq/L (ref 3–11)
BUN: 7.5 mg/dL (ref 7.0–26.0)
CALCIUM: 9 mg/dL (ref 8.4–10.4)
CO2: 25 mEq/L (ref 22–29)
CREATININE: 1 mg/dL (ref 0.6–1.1)
Chloride: 107 mEq/L (ref 98–109)
EGFR: 79 mL/min/{1.73_m2} — ABNORMAL LOW (ref >90)
Glucose: 110 mg/dl (ref 70–140)
Potassium: 3.9 mEq/L (ref 3.5–5.1)
Sodium: 141 mEq/L (ref 136–145)
Total Bilirubin: 0.43 mg/dL (ref 0.20–1.20)
Total Protein: 7.4 g/dL (ref 6.4–8.3)

## 2016-04-23 NOTE — Progress Notes (Signed)
 Patient Care Team: Hannah R Kim, DO as PCP - General (Family Medicine) Vincent K Cheek, MD as Consulting Physician (Emergency Medicine) Ni Gorsuch, MD as Consulting Physician (Hematology and Oncology) Bernard A Marshall, MD as Consulting Physician (Obstetrics and Gynecology) Shaili Deveshwar, MD as Consulting Physician (Rheumatology) Karen M Aquino, MD as Consulting Physician (Neurology)  DIAGNOSIS: Breast cancer of lower-outer quadrant of right female breast (HCC)   Staging form: Breast, AJCC 7th Edition   - Clinical: Stage Unknown (T2, NX, cM0) - Signed by Kalsoom K Khan, MD on 09/20/2013   - Pathologic stage from 03/01/2014: Stage IA (yT1b, N0, cM0) - Unsigned  SUMMARY OF ONCOLOGIC HISTORY:   Breast cancer of lower-outer quadrant of right female breast (HCC)   09/03/2013 Initial Biopsy    Right breast needle core biopsy at 8 o'clock position: IDC with DCIS, grade 3.  ER+ (weak 7%), PR+ (weak 6%), HER2 positive (ratio 2.22, copy #3.55). Ki67 is 70%.       09/14/2013 Breast MRI    lower outer right breast 2.2 x 1.3 x 1.5 cm Irregular mass. In the upper outer left breast there was a 7 x 5 x 8 mm indeterminate enhancing mass. Also another 9 x 7 x 6 mm indeterminant enhancing mass at 9:00 in the left breast was noted      09/15/2013 Clinical Stage    T2Nx; Stage IIA, right breast invasive ductal carcinoma, grade 3, ER positive, HER-2/neu positive       09/16/2013 Procedure    Left breast needle core biopsy at 2 o'clock position: Benign.  Right axillary LN biopsy: Benign       09/29/2013 Echocardiogram    Pre-treatment EF: 65-70%      10/02/2013 - 01/18/2014 Neo-Adjuvant Chemotherapy    Docetaxel, Carboplatin, Trastuzumab, Pertuzumab X 4 cycles; Taxotere stopped for fluid retention replaced with Gemcitabine 800 mg/m2 and completed 2 more cycles of chemo;  (TCH-P x 4 cycles), ([facility]-P x 2 cycles); Zolodex Q 4 weeks      11/16/2013 Procedure    Genetic testing negative.  Genes tested include  BRCA1, BRCA2.       03/01/2014 Definitive Surgery    Right lumpectomy with SLNB ([facility]) revealed high grade invasive carcinosarcoma (IMC w/malignant heterologous differentiation) spanning 0.6cm. Negative margins. 3 right axillary SLN (-). HER2 repeated & negative (ratio 0.71, copy #1.75).      03/01/2014 Pathologic Stage    ypT1b, pN0, pMX ; Stage IA      04/27/2014 - 06/07/2014 Radiation Therapy    Adjuvant radiation therapy completed ([facility]). Right breast: total dose 50 Gy over 25 fractions.  Right breast boost: total dose 10 Gy over 5 fractions.       07/06/2014 Echocardiogram    Most recent EF: 60-65%. Being followed in cardio-onc clinic.       07/09/2014 Surgery    Bilat salpingo-oopherectomy and lysis of adhesions; surgical path benign. Uterus and cervix remain intact.       07/28/2014 - 11/16/2014 Anti-estrogen oral therapy    Anastrozole 1 mg daily started.  Planned duration of treatment: 5 years.  Anastrazole stopped 10/2014 d/t pain in hands and feet. Switched to Tamoxifen.        - 10/20/2014 Chemotherapy    1 year maintenance Herceptin completed       11/16/2014 - 01/24/2015 Anti-estrogen oral therapy    Tamoxifen 20mg daily stopped due to persistent myalgias arthralgias and hot flashes      11/18/2014 Survivorship    Survivorship   Care Plan given and reviewed with pt in-person.       01/24/2015 - 04/23/2016 Anti-estrogen oral therapy    Exemestane 25 mg once daily, switched to tamoxifen 20 mg daily 10/24/2015       CHIEF COMPLIANT: Patient wishes to stop antiestrogen therapy  INTERVAL HISTORY: Tina [name] is a 55 year old with above-mentioned history of right breast cancer receiving neoadjuvant chemotherapy followed by lumpectomy and radiation and is currently on oral antiestrogen therapy. She is tolerated antiestrogen therapy very poorly. She wishes to discontinue tamoxifen therapy. She has major problems with memory.  REVIEW OF SYSTEMS:   Constitutional:  Denies fevers, chills or abnormal weight loss Eyes: Denies blurriness of vision Ears, nose, mouth, throat, and face: Denies mucositis or sore throat Respiratory: Denies cough, dyspnea or wheezes Cardiovascular: Denies palpitation, chest discomfort Gastrointestinal:  Denies nausea, heartburn or change in bowel habits Skin: Denies abnormal skin rashes Lymphatics: Denies new lymphadenopathy or easy bruising Neurological:Denies numbness, tingling or new weaknesses Behavioral/Psych: Mood is stable, no new changes  Extremities: No lower extremity edema Breast:  denies any pain or lumps or nodules in either breasts All other systems were reviewed with the patient and are negative.  I have reviewed the past medical history, past surgical history, social history and family history with the patient and they are unchanged from previous note.  ALLERGIES:  is allergic to adhesive [tape]; other; and nickel.  MEDICATIONS:  Current Outpatient Prescriptions  Medication Sig Dispense Refill  . aspirin 81 MG tablet Take 81 mg by mouth daily.    Marland Kitchen b complex vitamins capsule Take 1 capsule by mouth daily.    Marland Kitchen BIOTIN 5000 PO Take 1 capsule by mouth daily.     . Calcium Carbonate-Vit D-Min (CALTRATE 600+D PLUS MINERALS PO) Take 1 tablet by mouth daily.    Marland Kitchen DEXILANT 60 MG capsule     . Docusate Calcium (STOOL SOFTENER PO) Take by mouth as needed.    Marland Kitchen exemestane (AROMASIN) 25 MG tablet     . gabapentin (NEURONTIN) 300 MG capsule Take 1 capsule (300 mg total) by mouth 3 (three) times daily. 270 capsule 3  . hydroxychloroquine (PLAQUENIL) 200 MG tablet Take 200 mg by mouth daily.    Marland Kitchen lisinopril-hydrochlorothiazide (PRINZIDE,ZESTORETIC) 20-12.5 MG tablet TAKE 1 TABLET DAILY 90 tablet 1  . loratadine (CLARITIN) 10 MG tablet Take 10 mg by mouth daily.    Marland Kitchen MAGNESIUM PO Take by mouth daily.    . Polyethylene Glycol 3350 (MIRALAX PO) Take by mouth as needed.    . SUMAtriptan (IMITREX) 25 MG tablet Take 1 tablet  (25 mg total) by mouth every 2 (two) hours as needed for migraine. May repeat in 2 hours if needed. Do not take more then 2 tablets in 24 hours. 10 tablet 0  . tamoxifen (NOLVADEX) 20 MG tablet Take 1 tablet (20 mg total) by mouth daily. 90 tablet 3  . tiZANidine (ZANAFLEX) 2 MG tablet Take 1 tablet (2 mg total) by mouth every 8 (eight) hours as needed for muscle spasms. 30 tablet 0  . TURMERIC PO Take by mouth daily.    Marland Kitchen venlafaxine XR (EFFEXOR-XR) 75 MG 24 hr capsule Take 1 capsule (75 mg total) by mouth daily with breakfast. 90 capsule 2  . VITAMIN E EX Apply topically. Vaginal suppository     No current facility-administered medications for this visit.    Facility-Administered Medications Ordered in Other Visits  Medication Dose Route Frequency Provider Last Rate Last Dose  .  prochlorperazine (COMPAZINE) injection 10 mg  10 mg Intravenous Q6H PRN Minette Headland, NP   10 mg at 12/26/13 4098    PHYSICAL EXAMINATION: ECOG PERFORMANCE STATUS: 1 - Symptomatic but completely ambulatory  Vitals:   04/23/16 1511  BP: (!) 131/91  Pulse: 89  Resp: 18  Temp: 99.5 F (37.5 C)   Filed Weights   04/23/16 1511  Weight: 179 lb 1.6 oz (81.2 kg)    GENERAL:alert, no distress and comfortable SKIN: skin color, texture, turgor are normal, no rashes or significant lesions EYES: normal, Conjunctiva are pink and non-injected, sclera clear OROPHARYNX:no exudate, no erythema and lips, buccal mucosa, and tongue normal  NECK: supple, thyroid normal size, non-tender, without nodularity LYMPH:  no palpable lymphadenopathy in the cervical, axillary or inguinal LUNGS: clear to auscultation and percussion with normal breathing effort HEART: regular rate & rhythm and no murmurs and no lower extremity edema ABDOMEN:abdomen soft, non-tender and normal bowel sounds MUSCULOSKELETAL:no cyanosis of digits and no clubbing  NEURO: alert & oriented x 3 with fluent speech, no focal motor/sensory  deficits EXTREMITIES: No lower extremity edema BREAST: No palpable masses or nodules in either right or left breasts. No palpable axillary supraclavicular or infraclavicular adenopathy no breast tenderness or nipple discharge. (exam performed in the presence of a chaperone)  LABORATORY DATA:  I have reviewed the data as listed   Chemistry      Component Value Date/Time   NA 141 04/23/2016 1430   K 3.9 04/23/2016 1430   CL 98 09/25/2013 1320   CL 104 06/16/2012 1303   CO2 25 04/23/2016 1430   BUN 7.5 04/23/2016 1430   CREATININE 1.0 04/23/2016 1430      Component Value Date/Time   CALCIUM 9.0 04/23/2016 1430   ALKPHOS 56 04/23/2016 1430   AST 26 04/23/2016 1430   ALT 21 04/23/2016 1430   BILITOT 0.43 04/23/2016 1430       Lab Results  Component Value Date   WBC 2.9 (L) 04/23/2016   HGB 12.7 04/23/2016   HCT 37.2 04/23/2016   MCV 92.0 04/23/2016   PLT 219 04/23/2016   NEUTROABS 1.1 (L) 04/23/2016     ASSESSMENT & PLAN:  Breast cancer of lower-outer quadrant of right female breast Right breast T2N0 IIA invasive ductal carcinoma, grade 3, ER 7% PR 6%, Ki-67 70%, HER-2/neu positive. Status post neoadjuvant [place] for just started 10/02/2013 4 cycles stopped due to neuropathy and fluid retention, gemcitabine carboplatin Herceptin Perjeta 2 with Zoladex to 28 days status post lumpectomy 03/01/2014 residual 0.6 cm tumor grade 3. Completed radiation therapy November 2015  Antiestrogen therapy:  1. Initially given Anastrozole 1 mg daily (Herceptin maintenance completed 10/20/2014) 2. Switch to tamoxifen March 2016 3. Switch to exemestane 01/24/2015 (due to muscle aches, hot flashes) 4. Switched back to tamoxifen 10/24/2015 ( due to less side effect comparatively to AI therapy) Discontinued 04/23/2016 by her preference  Previous Tamoxifen, Exemestane and Anastrozole toxicities: Hot flashes: Improved with Effexor,  Aromatase inhibitor toxicities: Musculoskeletal symptoms could  be due to fibromyalgia Tamoxifen finally discontinued 04/23/2016 by patient preference  Breast cancer surveillance: 1. Mammogram 09/05/2015 is normal 2. Breast exam 04/23/2016 is normal  Survivorship: Patient has changed her diet and eat more fruits and vegetables and is exercising and staying active. I encouraged her to take turmeric in her diet or as a supplement.   Leukopenia/neutropenia: We will recheck her CBC in 6 months. ANC is 1500  Memory impairment: Patient will be evaluated for cognitive  dysfunction clinical trial with Aricept versus placebo.  Return to clinic in 6 months for follow-up  No orders of the defined types were placed in this encounter.  The patient has a good understanding of the overall plan. she agrees with it. she will call with any problems that may develop before the next visit here.   Rulon Eisenmenger, MD 04/23/16

## 2016-04-23 NOTE — Progress Notes (Deleted)
 Patient Care Team: Hannah R Kim, DO as PCP - General (Family Medicine) Vincent K Cheek, MD as Consulting Physician (Emergency Medicine) Ni Gorsuch, MD as Consulting Physician (Hematology and Oncology) Bernard A Marshall, MD as Consulting Physician (Obstetrics and Gynecology) Shaili Deveshwar, MD as Consulting Physician (Rheumatology) Karen M Aquino, MD as Consulting Physician (Neurology)  DIAGNOSIS: Breast cancer of lower-outer quadrant of right female breast (HCC)   Staging form: Breast, AJCC 7th Edition   - Clinical: Stage Unknown (T2, NX, cM0) - Signed by Kalsoom K Khan, MD on 09/20/2013   - Pathologic stage from 03/01/2014: Stage IA (yT1b, N0, cM0) - Unsigned  SUMMARY OF ONCOLOGIC HISTORY:   Breast cancer of lower-outer quadrant of right female breast (HCC)   09/03/2013 Initial Biopsy    Right breast needle core biopsy at 8 o'clock position: IDC with DCIS, grade 3.  ER+ (weak 7%), PR+ (weak 6%), HER2 positive (ratio 2.22, copy #3.55). Ki67 is 70%.       09/14/2013 Breast MRI    lower outer right breast 2.2 x 1.3 x 1.5 cm Irregular mass. In the upper outer left breast there was a 7 x 5 x 8 mm indeterminate enhancing mass. Also another 9 x 7 x 6 mm indeterminant enhancing mass at 9:00 in the left breast was noted      09/15/2013 Clinical Stage    T2Nx; Stage IIA, right breast invasive ductal carcinoma, grade 3, ER positive, HER-2/neu positive       09/16/2013 Procedure    Left breast needle core biopsy at 2 o'clock position: Benign.  Right axillary LN biopsy: Benign       09/29/2013 Echocardiogram    Pre-treatment EF: 65-70%      10/02/2013 - 01/18/2014 Neo-Adjuvant Chemotherapy    Docetaxel, Carboplatin, Trastuzumab, Pertuzumab X 4 cycles; Taxotere stopped for fluid retention replaced with Gemcitabine 800 mg/m2 and completed 2 more cycles of chemo;  ([facility]CH-P x 4 cycles), ([facility]-P x 2 cycles); Zolodex Q 4 weeks      11/16/2013 Procedure    Genetic testing negative.  Genes tested include  BRCA1, BRCA2.       03/01/2014 Definitive Surgery    Right lumpectomy with SLNB ([facility]) revealed high grade invasive carcinosarcoma (IMC w/malignant heterologous differentiation) spanning 0.6cm. Negative margins. 3 right axillary SLN (-). HER2 repeated & negative (ratio 0.71, copy #1.75).      03/01/2014 Pathologic Stage    ypT1b, pN0, pMX ; Stage IA      04/27/2014 - 06/07/2014 Radiation Therapy    Adjuvant radiation therapy completed ([place]). Right breast: total dose 50 Gy over 25 fractions.  Right breast boost: total dose 10 Gy over 5 fractions.       07/06/2014 Echocardiogram    Most recent EF: 60-65%. Being followed in cardio-onc clinic.       07/09/2014 Surgery    Bilat salpingo-oopherectomy and lysis of adhesions; surgical path benign. Uterus and cervix remain intact.       07/28/2014 - 11/16/2014 Anti-estrogen oral therapy    Anastrozole 1 mg daily started.  Planned duration of treatment: 5 years.  Anastrazole stopped 10/2014 d/t pain in hands and feet. Switched to Tamoxifen.        - 10/20/2014 Chemotherapy    1 year maintenance Herceptin completed       11/16/2014 - 01/24/2015 Anti-estrogen oral therapy    Tamoxifen 20mg daily stopped due to persistent myalgias arthralgias and hot flashes      11/18/2014 Survivorship    Survivorship   Care Plan given and reviewed with pt in-person.       01/24/2015 -  Anti-estrogen oral therapy    Exemestane 25 mg once daily, switched to tamoxifen 20 mg daily 10/24/2015       CHIEF COMPLIANT:   INTERVAL HISTORY: Tina [name] is a     REVIEW OF SYSTEMS:   Constitutional: Denies fevers, chills or abnormal weight loss Eyes: Denies blurriness of vision Ears, nose, mouth, throat, and face: Denies mucositis or sore throat Respiratory: Denies cough, dyspnea or wheezes Cardiovascular: Denies palpitation, chest discomfort Gastrointestinal:  Denies nausea, heartburn or change in bowel habits Skin: Denies abnormal skin  rashes Lymphatics: Denies new lymphadenopathy or easy bruising Neurological:Denies numbness, tingling or new weaknesses Behavioral/Psych: Mood is stable, no new changes  Extremities: No lower extremity edema Breast: *** denies any pain or lumps or nodules in either breasts All other systems were reviewed with the patient and are negative.  I have reviewed the past medical history, past surgical history, social history and family history with the patient and they are unchanged from previous note.  ALLERGIES:  is allergic to adhesive [tape]; other; and nickel.  MEDICATIONS:  Current Outpatient Prescriptions  Medication Sig Dispense Refill  . aspirin 81 MG tablet Take 81 mg by mouth daily.    . b complex vitamins capsule Take 1 capsule by mouth daily.    . BIOTIN 5000 PO Take 1 capsule by mouth daily.     . Calcium Carbonate-Vit D-Min (CALTRATE 600+D PLUS MINERALS PO) Take 1 tablet by mouth daily.    . DEXILANT 60 MG capsule     . Docusate Calcium (STOOL SOFTENER PO) Take by mouth as needed.    . exemestane (AROMASIN) 25 MG tablet     . gabapentin (NEURONTIN) 300 MG capsule Take 1 capsule (300 mg total) by mouth 3 (three) times daily. 270 capsule 3  . hydroxychloroquine (PLAQUENIL) 200 MG tablet Take 200 mg by mouth daily.    . lisinopril-hydrochlorothiazide (PRINZIDE,ZESTORETIC) 20-12.5 MG tablet TAKE 1 TABLET DAILY 90 tablet 1  . loratadine (CLARITIN) 10 MG tablet Take 10 mg by mouth daily.    . MAGNESIUM PO Take by mouth daily.    . Polyethylene Glycol 3350 (MIRALAX PO) Take by mouth as needed.    . SUMAtriptan (IMITREX) 25 MG tablet Take 1 tablet (25 mg total) by mouth every 2 (two) hours as needed for migraine. May repeat in 2 hours if needed. Do not take more then 2 tablets in 24 hours. 10 tablet 0  . tamoxifen (NOLVADEX) 20 MG tablet Take 1 tablet (20 mg total) by mouth daily. 90 tablet 3  . tiZANidine (ZANAFLEX) 2 MG tablet Take 1 tablet (2 mg total) by mouth every 8 (eight) hours  as needed for muscle spasms. 30 tablet 0  . TURMERIC PO Take by mouth daily.    . venlafaxine XR (EFFEXOR-XR) 75 MG 24 hr capsule Take 1 capsule (75 mg total) by mouth daily with breakfast. 90 capsule 2  . VITAMIN E EX Apply topically. Vaginal suppository     No current facility-administered medications for this visit.    Facility-Administered Medications Ordered in Other Visits  Medication Dose Route Frequency Provider Last Rate Last Dose  . prochlorperazine (COMPAZINE) injection 10 mg  10 mg Intravenous Q6H PRN Lindsey N Cornetto, NP   10 mg at 12/26/13 0917    PHYSICAL EXAMINATION: ECOG PERFORMANCE STATUS: {CHL ONC ECOG [id number]}  Vitals:   04/23/16 1511  BP: (!) 131/91    Pulse: 89  Resp: 18  Temp: 99.5 F (37.5 C)   Filed Weights   04/23/16 1511  Weight: 179 lb 1.6 oz (81.2 kg)    GENERAL:alert, no distress and comfortable SKIN: skin color, texture, turgor are normal, no rashes or significant lesions EYES: normal, Conjunctiva are pink and non-injected, sclera clear OROPHARYNX:no exudate, no erythema and lips, buccal mucosa, and tongue normal  NECK: supple, thyroid normal size, non-tender, without nodularity LYMPH:  no palpable lymphadenopathy in the cervical, axillary or inguinal LUNGS: clear to auscultation and percussion with normal breathing effort HEART: regular rate & rhythm and no murmurs and no lower extremity edema ABDOMEN:abdomen soft, non-tender and normal bowel sounds MUSCULOSKELETAL:no cyanosis of digits and no clubbing  NEURO: alert & oriented x 3 with fluent speech, no focal motor/sensory deficits EXTREMITIES: No lower extremity edema BREAST:*** No palpable masses or nodules in either right or left breasts. No palpable axillary supraclavicular or infraclavicular adenopathy no breast tenderness or nipple discharge. (exam performed in the presence of a chaperone)  LABORATORY DATA:  I have reviewed the data as listed   Chemistry      Component Value  Date/Time   NA 141 04/23/2016 1430   K 3.9 04/23/2016 1430   CL 98 09/25/2013 1320   CL 104 06/16/2012 1303   CO2 25 04/23/2016 1430   BUN 7.5 04/23/2016 1430   CREATININE 1.0 04/23/2016 1430      Component Value Date/Time   CALCIUM 9.0 04/23/2016 1430   ALKPHOS 56 04/23/2016 1430   AST 26 04/23/2016 1430   ALT 21 04/23/2016 1430   BILITOT 0.43 04/23/2016 1430       Lab Results  Component Value Date   WBC 2.9 (L) 04/23/2016   HGB 12.7 04/23/2016   HCT 37.2 04/23/2016   MCV 92.0 04/23/2016   PLT 219 04/23/2016   NEUTROABS 1.1 (L) 04/23/2016     ASSESSMENT & PLAN:  Breast cancer of lower-outer quadrant of right female breast Right breast T2N0 IIA invasive ductal carcinoma, grade 3, ER 7% PR 6%, Ki-67 70%, HER-2/neu positive. Status post neoadjuvant [place] for just started 10/02/2013 4 cycles stopped due to neuropathy and fluid retention, gemcitabine carboplatin Herceptin Perjeta 2 with Zoladex to 28 days status post lumpectomy 03/01/2014 residual 0.6 cm tumor grade 3. Completed radiation therapy November 2015  Antiestrogen therapy:  1. Initially given Anastrozole 1 mg daily (Herceptin maintenance completed 10/20/2014) 2. Switch to tamoxifen March 2016 3. Switch to exemestane 01/24/2015 (due to muscle aches, hot flashes) 4. Switched back to tamoxifen 10/24/2015 ( due to less side effect comparatively to AI therapy)  Previous Tamoxifen, Exemestane and Anastrozole toxicities: Hot flashes: Improved with Effexor,  Aromatase inhibitor toxicities: Musculoskeletal symptoms could be due to fibromyalgia  Tamoxifen toxicities:  Breast cancer surveillance: 1. Mammogram 09/05/2015 is normal 2. Breast exam 04/23/2016 is normal  Survivorship: Patient has changed her diet and eat more fruits and vegetables and is exercising and staying active. I encouraged her to take turmeric in her diet or as a supplement.   Leukopenia/neutropenia: We will recheck her CBC in 6 months. ANC is  1500   Return to clinic in 6 months for follow-up   No orders of the defined types were placed in this encounter.  The patient has a good understanding of the overall plan. she agrees with it. she will call with any problems that may develop before the next visit here.   Rulon Eisenmenger, MD 04/23/16

## 2016-04-23 NOTE — Assessment & Plan Note (Signed)
Right breast T2N0 IIA invasive ductal carcinoma, grade 3, ER 7% PR 6%, Ki-67 70%, HER-2/neu positive. Status post neoadjuvant [place] for just started 10/02/2013 4 cycles stopped due to neuropathy and fluid retention, gemcitabine carboplatin Herceptin Perjeta 2 with Zoladex to 28 days status post lumpectomy 03/01/2014 residual 0.6 cm tumor grade 3. Completed radiation therapy November 2015  Antiestrogen therapy:  1. Initially given Anastrozole 1 mg daily (Herceptin maintenance completed 10/20/2014) 2. Switch to tamoxifen March 2016 3. Switch to exemestane 01/24/2015 (due to muscle aches, hot flashes) 4. Switched back to tamoxifen 10/24/2015 ( due to less side effect comparatively to AI therapy)  Previous Tamoxifen, Exemestane and Anastrozole toxicities: Hot flashes: Improved with Effexor,  Aromatase inhibitor toxicities: Musculoskeletal symptoms could be due to fibromyalgia  Tamoxifen toxicities:  Breast cancer surveillance: 1. Mammogram 09/05/2015 is normal 2. Breast exam 04/23/2016 is normal  Survivorship: Patient has changed her diet and eat more fruits and vegetables and is exercising and staying active. I encouraged her to take turmeric in her diet or as a supplement.   Leukopenia/neutropenia: We will recheck her CBC in 6 months. ANC is 1500   Return to clinic in 6 months for follow-up

## 2016-05-15 ENCOUNTER — Other Ambulatory Visit: Payer: Self-pay | Admitting: Family Medicine

## 2016-05-30 NOTE — Progress Notes (Signed)
HPI:  Tina [name] is a pleasant 55 yo here for follow up. Her PMH and summary are listed below. She saw her oncologist recent for routine follow up and had a CMP and CBC there with leukopenia and neutropenia which they will be monitoring according to their notes - she reports she'll actually be checking this sooner with her rheumatologist. Reports she decided not to take the Aromasin or tamoxifen any longer and oncologist is aware and ok with this. Does not wish to do labs here today. She reports she feels great, has increased her exercise and has actually decreased her blood pressure medicine to one half. She reports she checks her blood pressure at home and it runs in the 1 teens over 12s. She denies any complaints today. Denies chest pain, shortness of breath, bowel issues, fever or malaise.  HTN: -meds:lisinopril-hctz 20-12.5 (1/2 tablet daily now as has increased exercise - monitors at home), asa 81 mg  -denies: CP, SOB, DOE  GERD/chronic constipation: -meds: dexilant 60 mg -did have esophagitis in the past -take mirilax and colace for the constipation  Chronic low back and hip pain: -ok currently -hx DDD and lupus (sees rheumatologist) -has seen ortho, acupuncturist, Dr. Lorin Mercy and Dr. Leonides Schanz -has done PT  -intermittent flares of pain in R low back, sometimes radiating to R buttock and hip  Cervicogenic headaches and facial pain/Migraines: -seeing neurology, rheum and ortho for this -MRI head and neck with some DDD -meds: venlafaxine, neuronitin, prn triptan, tramadol muscle relaxer  ? Fibromyalgia and Lupus: -seeing Dr. Estanislado Pandy  -meds: plaquenil, neurontin, tizanidine, venlafaxine  Anxiety/hot flashes: -neurology started venlafaxine for this and her other issues  Hx breast ca: -R breast -sees Oncologist, Dr. Loleta Dicker -s/p lumpectomy, chemo, radiation in 2015, on aromasin in the past - declined further  ROS: See pertinent positives and negatives per HPI.  Past  Medical History:  Diagnosis Date  . Anxiety   . Cancer of right breast ([place]) 09/03/13   Invasive Ductal Carcinoma/Ductal Carcinoma [facility]  biopsies  . Chronic low back pain 01/24/2016   -and R hip pain -seen by rheumatologist and ortho in the past -MRI with Dr. Lorin Mercy in 2015, R hip plain films in 2015 as well acupuncture, injections, tramadol not helpful [facility] and PT helpful   . Degenerative joint disease    back neck and shoulder  . Fibromyalgia    Dr. Estanislado Pandy  . GERD (gastroesophageal reflux disease)    hx esophagitis  . Hot flashes   . Hypertension   . Leukopenia 06/16/2013  . Lupus    rheumatologist - Dr. Estanislado Pandy  . Migraines   . Neuromuscular disorder ([place])    neuropathy from chemo in feet/hands  . S/P radiation therapy 04/27/2014-06/07/2014   1) Right breast, 50 Gy in 25 fractions/ 2) Right breast boost, 10 Gy in 5 fractions    Past Surgical History:  Procedure Laterality Date  . BREAST SURGERY  03/01/2014   hx right breast cancer- 3 lymph nodes removed and lumpectomy  . [place]   rt  . CESAREAN SECTION     x 3  . PORTACATH PLACEMENT  08/2013   still currently active   . ROBOTIC ASSISTED BILATERAL SALPINGO OOPHERECTOMY Bilateral 07/09/2014   Procedure: ROBOTIC ASSISTED BILATERAL SALPINGO OOPHORECTOM; :LYSIS OF ADHESIONS;  Surgeon: Marvene Staff, MD;  Location: [facility];  Service: Gynecology;  Laterality: Bilateral;  . TRIGGER FINGER RELEASE  2004   rt thumb  .  trigger finger release left thumb  06/28/14   at [facility] surgical center  . TUBAL LIGATION    . WISDOM TOOTH EXTRACTION      Family History  Problem Relation Age of Onset  . Hypertension Mother   . Dementia Mother     small vessel disease  . Cancer Mother 30    cervical  cancer  . Cancer Father 6    prostate ca  . Diabetes Father   . Arthritis Father     Social History   Social History  . Marital status: Married    Spouse name: N/A  .  Number of children: 1  . Years of education: N/A   Occupational History  .      homemaker   Social History Main Topics  . Smoking status: Former Smoker    Packs/day: 1.00    Years: 15.00    Types: Cigars, Cigarettes    Quit date: 02/23/2004  . Smokeless tobacco: Never Used     Comment: remote smoking history  . Alcohol use 0.0 oz/week     Comment: occ   . Drug use:      Comment: [place]  . Sexual activity: Yes   Other Topics Concern  . 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      Home Situation: lives with husband and 74 yo son      Spiritual Beliefs: Christian      Lifestyle: elliptical 3-4 times per week; working on diet as well over last year in 2014              Current Outpatient Prescriptions:  .  aspirin 81 MG tablet, Take 81 mg by mouth daily., Disp: , Rfl:  .  b complex vitamins capsule, Take 1 capsule by mouth daily., Disp: , Rfl:  .  BIOTIN 5000 PO, Take 1 capsule by mouth daily. , Disp: , Rfl:  .  Calcium Carbonate-Vit D-Min (CALTRATE 600+D PLUS MINERALS PO), Take 1 tablet by mouth daily., Disp: , Rfl:  .  DEXILANT 60 MG capsule, , Disp: , Rfl:  .  Docusate Calcium (STOOL SOFTENER PO), Take by mouth as needed., Disp: , Rfl:  .  gabapentin (NEURONTIN) 300 MG capsule, Take 1 capsule (300 mg total) by mouth 3 (three) times daily., Disp: 270 capsule, Rfl: 3 .  hydroxychloroquine (PLAQUENIL) 200 MG tablet, Take 200 mg by mouth daily., Disp: , Rfl:  .  lisinopril-hydrochlorothiazide (PRINZIDE,ZESTORETIC) 20-12.5 MG tablet, Take 0.5 tablets by mouth daily., Disp: 90 tablet, Rfl: 1 .  loratadine (CLARITIN) 10 MG tablet, Take 10 mg by mouth daily., Disp: , Rfl:  .  MAGNESIUM PO, Take by mouth daily., Disp: , Rfl:  .  Polyethylene Glycol 3350 (MIRALAX PO), Take by mouth as needed., Disp: , Rfl:  .  SUMAtriptan (IMITREX) 25 MG tablet, Take 1 tablet (25 mg total) by mouth every 2 (two) hours as needed for migraine. May repeat in 2 hours if  needed. Do not take more then 2 tablets in 24 hours., Disp: 10 tablet, Rfl: 0 .  tiZANidine (ZANAFLEX) 2 MG tablet, Take 1 tablet (2 mg total) by mouth every 8 (eight) hours as needed for muscle spasms., Disp: 30 tablet, Rfl: 0 .  TURMERIC PO, Take by mouth daily., Disp: , Rfl:  .  venlafaxine XR (EFFEXOR-XR) 75 MG 24 hr capsule, Take 1 capsule (75 mg total) by mouth daily with breakfast., Disp: 90 capsule, Rfl: 2 .  VITAMIN E EX, Apply topically. Vaginal suppository, Disp: , Rfl:  No current facility-administered medications for this visit.   Facility-Administered Medications Ordered in Other Visits:  .  prochlorperazine (COMPAZINE) injection 10 mg, 10 mg, Intravenous, Q6H PRN, Minette Headland, NP, 10 mg at 12/26/13 0917  EXAM:  Vitals:   05/31/16 1109  BP: 132/80  Pulse: 99  Temp: 98.9 F (37.2 C)    Body mass index is 30.78 kg/m.  GENERAL: vitals reviewed and listed above, alert, oriented, appears well hydrated and in no acute distress  HEENT: atraumatic, conjunttiva clear, no obvious abnormalities on inspection of external nose and ears  NECK: no obvious masses on inspection  LUNGS: clear to auscultation bilaterally, no wheezes, rales or rhonchi, good air movement  CV: HRRR, no peripheral edema  MS: moves all extremities without noticeable abnormality  PSYCH: pleasant and cooperative, no obvious depression or anxiety  ASSESSMENT AND PLAN:  Discussed the following assessment and plan:  Essential hypertension  Hyperlipidemia, unspecified hyperlipidemia type  BMI 30.0-30.9,adult  Peripheral neuropathy due to chemotherapy (HCC)  Lupus erythematosus, unspecified form  -seems to be doing well with most issues stable, BP improving -congratulated on healthy lifestyle -she plans to monitor WBCs with onc and rheuma - declined labs here today -updated med list - decreased antihypertensive and she plans to continue to monitor at home -fasting labs next  visit -declined flu shot -Patient advised to return or notify a doctor immediately if symptoms worsen or persist or new concerns arise.  Patient Instructions  BEFORE YOU LEAVE: -follow up: 3-4 months  Keep up the healthy diet and regular exercise!    Colin Benton R., DO

## 2016-05-31 ENCOUNTER — Encounter: Payer: Self-pay | Admitting: Family Medicine

## 2016-05-31 ENCOUNTER — Ambulatory Visit (INDEPENDENT_AMBULATORY_CARE_PROVIDER_SITE_OTHER): Payer: BLUE CROSS/BLUE SHIELD | Admitting: Family Medicine

## 2016-05-31 VITALS — BP 132/80 | HR 99 | Temp 98.9°F | Ht 64.0 in | Wt 179.3 lb

## 2016-05-31 DIAGNOSIS — I10 Essential (primary) hypertension: Secondary | ICD-10-CM

## 2016-05-31 DIAGNOSIS — E785 Hyperlipidemia, unspecified: Secondary | ICD-10-CM | POA: Diagnosis not present

## 2016-05-31 DIAGNOSIS — Z6830 Body mass index (BMI) 30.0-30.9, adult: Secondary | ICD-10-CM

## 2016-05-31 DIAGNOSIS — G620 Drug-induced polyneuropathy: Secondary | ICD-10-CM

## 2016-05-31 DIAGNOSIS — L930 Discoid lupus erythematosus: Secondary | ICD-10-CM

## 2016-05-31 DIAGNOSIS — T451X5A Adverse effect of antineoplastic and immunosuppressive drugs, initial encounter: Secondary | ICD-10-CM

## 2016-05-31 NOTE — Patient Instructions (Signed)
BEFORE YOU LEAVE: -follow up: 3-4 months  Keep up the healthy diet and regular exercise!

## 2016-05-31 NOTE — Progress Notes (Signed)
Pre visit review using our clinic review tool, if applicable. No additional management support is needed unless otherwise documented below in the visit note. 

## 2016-07-03 ENCOUNTER — Other Ambulatory Visit: Payer: Self-pay | Admitting: Hematology and Oncology

## 2016-07-03 DIAGNOSIS — C50511 Malignant neoplasm of lower-outer quadrant of right female breast: Secondary | ICD-10-CM

## 2016-07-06 ENCOUNTER — Other Ambulatory Visit: Payer: Self-pay

## 2016-07-06 DIAGNOSIS — C50511 Malignant neoplasm of lower-outer quadrant of right female breast: Secondary | ICD-10-CM

## 2016-07-06 MED ORDER — OMEPRAZOLE 40 MG PO CPDR: 40.0000 mg | DELAYED_RELEASE_CAPSULE | Freq: Every day | ORAL | 4 refills | Status: DC

## 2016-08-03 ENCOUNTER — Other Ambulatory Visit: Payer: Self-pay | Admitting: Nurse Practitioner

## 2016-08-08 ENCOUNTER — Other Ambulatory Visit: Payer: Self-pay | Admitting: Hematology and Oncology

## 2016-08-08 DIAGNOSIS — Z6831 Body mass index (BMI) 31.0-31.9, adult: Secondary | ICD-10-CM | POA: Diagnosis not present

## 2016-08-08 DIAGNOSIS — Z01419 Encounter for gynecological examination (general) (routine) without abnormal findings: Secondary | ICD-10-CM | POA: Diagnosis not present

## 2016-08-08 DIAGNOSIS — Z853 Personal history of malignant neoplasm of breast: Secondary | ICD-10-CM

## 2016-08-31 ENCOUNTER — Other Ambulatory Visit: Payer: Self-pay | Admitting: Family Medicine

## 2016-09-06 ENCOUNTER — Ambulatory Visit
Admission: RE | Admit: 2016-09-06 | Discharge: 2016-09-06 | Disposition: A | Discharge status: Home / Self Care | Payer: BLUE CROSS/BLUE SHIELD | Source: Ambulatory Visit | Attending: Hematology and Oncology | Admitting: Hematology and Oncology

## 2016-09-06 ENCOUNTER — Encounter: Payer: Self-pay | Admitting: Radiology

## 2016-09-06 DIAGNOSIS — Z853 Personal history of malignant neoplasm of breast: Secondary | ICD-10-CM

## 2016-09-06 DIAGNOSIS — R922 Inconclusive mammogram: Secondary | ICD-10-CM | POA: Diagnosis not present

## 2016-09-06 HISTORY — DX: Personal history of irradiation: Z92.3

## 2016-09-06 HISTORY — DX: Personal history of antineoplastic chemotherapy: Z92.21

## 2016-09-06 HISTORY — DX: Malignant neoplasm of unspecified site of unspecified female breast: C50.919

## 2016-09-24 ENCOUNTER — Other Ambulatory Visit: Payer: Self-pay | Admitting: Hematology and Oncology

## 2016-09-24 DIAGNOSIS — C50511 Malignant neoplasm of lower-outer quadrant of right female breast: Secondary | ICD-10-CM

## 2016-09-28 ENCOUNTER — Ambulatory Visit: Payer: Self-pay | Admitting: Family Medicine

## 2016-09-30 ENCOUNTER — Other Ambulatory Visit: Payer: Self-pay | Admitting: Hematology and Oncology

## 2016-09-30 DIAGNOSIS — C50511 Malignant neoplasm of lower-outer quadrant of right female breast: Secondary | ICD-10-CM

## 2016-10-08 ENCOUNTER — Other Ambulatory Visit: Payer: Self-pay

## 2016-10-08 DIAGNOSIS — C50511 Malignant neoplasm of lower-outer quadrant of right female breast: Secondary | ICD-10-CM

## 2016-10-08 MED ORDER — OMEPRAZOLE 40 MG PO CPDR: 40.0000 mg | DELAYED_RELEASE_CAPSULE | Freq: Every day | ORAL | 1 refills | Status: DC

## 2016-10-09 NOTE — Progress Notes (Signed)
Office Visit Note  Patient: Tina [name]             Date of Birth: 11/21/1960           MRN: [id number]             PCP: [name], DO Referring: [name], DO Visit Date: 10/11/2016 Occupation: _0 @    Subjective:  Follow-up on autoimmune disease.   History of Present Illness: Tina [name] is a 56 y.o. female with history of autoimmune disease and osteoarthritis. Patient reports that she discontinue Plaquenil at the end of December. She states that she was tired of taking many medications and she was doing well. He has some discomfort in her hands but denies swelling. She still has some fatigue but not as bad . she continues to have sicca symptoms. She had one is oral ulcers few weeks ago which resolved by itself. Her joints overall are doing well. She continues to have some discomfort from fibromyalgia but it has not increased.   Activities of Daily Living:  Patient reports morning stiffness for 60 minutes.   Patient Denies nocturnal pain.  Difficulty dressing/grooming: Denies Difficulty climbing stairs: Denies Difficulty getting out of chair: Denies Difficulty using hands for taps, buttons, cutlery, and/or writing: Denies   Review of Systems  Constitutional: Positive for fatigue. Negative for night sweats, weight gain, weight loss and weakness.  HENT: Positive for mouth dryness. Negative for mouth sores, trouble swallowing, trouble swallowing and nose dryness.   Eyes: Positive for dryness. Negative for pain, redness and visual disturbance.  Respiratory: Negative for cough, shortness of breath and difficulty breathing.   Cardiovascular: Negative for chest pain, palpitations, hypertension, irregular heartbeat and swelling in legs/feet.  Gastrointestinal: Negative for blood in stool, constipation and diarrhea.  Endocrine: Negative for increased urination.  Genitourinary: Negative for vaginal dryness.  Musculoskeletal: Positive for arthralgias, joint pain,  myalgias, morning stiffness and myalgias. Negative for joint swelling, muscle weakness and muscle tenderness.  Skin: Negative for color change, rash, hair loss, skin tightness, ulcers and sensitivity to sunlight.  Allergic/Immunologic: Negative for susceptible to infections.  Neurological: Negative for dizziness, memory loss and night sweats.  Hematological: Negative for swollen glands.  Psychiatric/Behavioral: Negative for depressed mood and sleep disturbance. The patient is not nervous/anxious.     PMFS History:  Patient Active Problem List   Diagnosis Date Noted  . Fibromyalgia 02/28/2016  . Chronic low back pain 01/24/2016  . Lupus 04/19/2015  . GERD (gastroesophageal reflux disease) 04/19/2015  . Cervicogenic headache 02/10/2015  . Neck pain 02/10/2015  . Hot flashes 10/26/2014  . GAD (generalized anxiety disorder) 10/26/2014  . Peripheral neuropathy due to chemotherapy ([place]) 03/12/2014  . Breast cancer of lower-outer quadrant of right female breast ([place]) 09/15/2013  . Left shoulder pain - seeing Dr. Lorin Mercy 08/20/2013  . Hypertension     Past Medical History:  Diagnosis Date  . Anxiety   . Breast cancer ([place]) 2015   lumpectomy  . Cancer of right breast ([place]) 09/03/13   Invasive Ductal Carcinoma/Ductal Carcinoma Insitu  biopsies  . Chronic low back pain 01/24/2016   -and R hip pain -seen by rheumatologist and ortho in the past -MRI with Dr. Lorin Mercy in 2015, R hip plain films in 2015 as well acupuncture, injections, tramadol not helpful [facility] and [facility] helpful   . Degenerative joint disease    back neck and shoulder  . Fibromyalgia    Dr. Estanislado Pandy  . GERD (  gastroesophageal reflux disease)    hx esophagitis  . Hot flashes   . Hypertension   . Leukopenia 06/16/2013  . Lupus    rheumatologist - Dr. Estanislado Pandy  . Migraines   . Neuromuscular disorder ([place])    neuropathy from chemo in feet/hands  . Personal history of chemotherapy    2015  .  Personal history of radiation therapy 2015  . S/P radiation therapy 04/27/2014-06/07/2014   1) Right breast, 50 Gy in 25 fractions/ 2) Right breast boost, 10 Gy in 5 fractions    Family History  Problem Relation Age of Onset  . Hypertension Mother   . Dementia Mother     small vessel disease  . Cancer Mother 30    cervical  cancer  . Cancer Father 57    prostate ca  . Diabetes Father   . Arthritis Father    Past Surgical History:  Procedure Laterality Date  . BREAST BIOPSY Left 2015   benign core  . BREAST BIOPSY Right 2015   malignant core   . BREAST SURGERY  03/01/2014   hx right breast cancer- 3 lymph nodes removed and lumpectomy  . [place]   rt  . CESAREAN SECTION     x 3  . PORTACATH PLACEMENT  08/2013   still currently active   . ROBOTIC ASSISTED BILATERAL SALPINGO OOPHERECTOMY Bilateral 07/09/2014   Procedure: ROBOTIC ASSISTED BILATERAL SALPINGO OOPHORECTOM; :LYSIS OF ADHESIONS;  Surgeon: Marvene Staff, MD;  Location: [facility];  Service: Gynecology;  Laterality: Bilateral;  . TRIGGER FINGER RELEASE  2004   rt thumb  . trigger finger release left thumb  06/28/14   [facility]  . TUBAL LIGATION    . 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 She appears well-developed and well-nourished.  HENT:  Head: Normocephalic and atraumatic.  Eyes: Conjunctivae and EOM are normal.  Neck: Normal range of motion.  Cardiovascular: Normal rate, regular rhythm, normal heart sounds and  intact distal pulses.   Pulmonary/Chest: Effort normal and breath sounds normal.  Abdominal: Soft. Bowel sounds are normal.  Lymphadenopathy:    She has no cervical adenopathy.  Neurological: She is alert and oriented to person, place, and time.  Skin: Skin is warm and dry. Capillary refill takes less than 2 seconds.  Psychiatric: She has a normal mood and affect. Her behavior is normal.  Nursing note and vitals reviewed.    Musculoskeletal Exam: C-spine and thoracic lumbar spine good range of motion. Shoulder joints elbow joints wrist joint MCPs PIPs DIPs with good range of motion with no synovitis hip joints knee joints ankles MTPs PIPs DIPs with good range of motion with no synovitis. Fibromyalgia tender points only 6 out of 18 positive.  CDAI Exam: No CDAI exam completed.  Investigation: Findings:  June 2017 WBC 3.2, CMP normal, August 2017 ESR 4, CCP 116, ANA 1:320 speckled C3-C4 normal, DS negative    Imaging: No results found.  Speciality Comments: No specialty comments available.    Procedures:  No procedures performed Allergies: Adhesive [tape]; Other; and Nickel   Assessment / Plan:     Visit Diagnoses: Autoimmune disease ([place]) - Positive ANA, history of positive double-stranded DNA, positive CCP, neutropenia, fatigue, sicca symptoms, parotitis, oral ulcers. Patient has been off Plaquenil since January. She states she's been asymptomatic except for having one oral ulcer and she still feels somewhat fatigued. She continues to have sicca symptoms. At this point she does not want to start on Plaquenil. We had detailed discussion regarding that we'll check her labs today and see how her antibodies and complements are doing. If her labs are stable we'll hold off Plaquenil and repeat her labs and X months.  High risk medication use - Plaquenil was discontinued by patient in December.  Primary osteoarthritis of both hands: Minimal discomfort  Primary osteoarthritis of  both hips: Minimal discomfort. Weight loss diet and exercise was discussed.  Primary osteoarthritis of both feet: Proper fitting shoes were discussed.  DJD (degenerative joint disease), cervical  Myofascial pain syndrome: She continues to have some generalized pain  Osteopenia of other site    Orders: Orders Placed This Encounter  Procedures  . CBC with Differential/Platelet  . COMPLETE METABOLIC PANEL WITH GFR  . Urinalysis, Routine w reflex microscopic  . Sedimentation rate  . Antinuclear Antib (ANA)  . Anti-DNA antibody, double-stranded  . C3 and C4  . Cyclic citrul peptide antibody, IgG   No orders of the defined types were placed in this encounter.   Face-to-face time spent with patient was39 minutes. 50% of time was spent in counseling and coordination of care.  Follow-Up Instructions: Return in about 6 months (around 04/13/2017) for Autoimmune disease.   [name], MD  Note - This record has been created using Editor, commissioning.  Chart creation errors have been sought, but may not always  have been located. Such creation errors do not reflect on  the standard of medical care.

## 2016-10-11 ENCOUNTER — Encounter: Payer: Self-pay | Admitting: Rheumatology

## 2016-10-11 ENCOUNTER — Ambulatory Visit (INDEPENDENT_AMBULATORY_CARE_PROVIDER_SITE_OTHER): Payer: BLUE CROSS/BLUE SHIELD | Admitting: Rheumatology

## 2016-10-11 VITALS — BP 110/74 | HR 80 | Resp 16 | Ht 64.0 in | Wt 183.0 lb

## 2016-10-11 DIAGNOSIS — M160 Bilateral primary osteoarthritis of hip: Secondary | ICD-10-CM | POA: Diagnosis not present

## 2016-10-11 DIAGNOSIS — I10 Essential (primary) hypertension: Secondary | ICD-10-CM

## 2016-10-11 DIAGNOSIS — Z79899 Other long term (current) drug therapy: Secondary | ICD-10-CM | POA: Diagnosis not present

## 2016-10-11 DIAGNOSIS — M19072 Primary osteoarthritis, left ankle and foot: Secondary | ICD-10-CM

## 2016-10-11 DIAGNOSIS — M47812 Spondylosis without myelopathy or radiculopathy, cervical region: Secondary | ICD-10-CM

## 2016-10-11 DIAGNOSIS — M359 Systemic involvement of connective tissue, unspecified: Secondary | ICD-10-CM

## 2016-10-11 DIAGNOSIS — M8588 Other specified disorders of bone density and structure, other site: Secondary | ICD-10-CM

## 2016-10-11 DIAGNOSIS — C50511 Malignant neoplasm of lower-outer quadrant of right female breast: Secondary | ICD-10-CM | POA: Diagnosis not present

## 2016-10-11 DIAGNOSIS — M7918 Myalgia, other site: Secondary | ICD-10-CM

## 2016-10-11 DIAGNOSIS — M5030 Other cervical disc degeneration, unspecified cervical region: Secondary | ICD-10-CM

## 2016-10-11 DIAGNOSIS — M19041 Primary osteoarthritis, right hand: Secondary | ICD-10-CM

## 2016-10-11 DIAGNOSIS — M791 Myalgia: Secondary | ICD-10-CM | POA: Diagnosis not present

## 2016-10-11 DIAGNOSIS — K219 Gastro-esophageal reflux disease without esophagitis: Secondary | ICD-10-CM | POA: Diagnosis not present

## 2016-10-11 DIAGNOSIS — M19042 Primary osteoarthritis, left hand: Secondary | ICD-10-CM

## 2016-10-11 DIAGNOSIS — M19071 Primary osteoarthritis, right ankle and foot: Secondary | ICD-10-CM | POA: Diagnosis not present

## 2016-10-11 DIAGNOSIS — F411 Generalized anxiety disorder: Secondary | ICD-10-CM

## 2016-10-11 LAB — CBC WITH DIFFERENTIAL/PLATELET
BASOS PCT: 1 %
Basophils Absolute: 33 cells/uL (ref 0–200)
Eosinophils Absolute: 33 cells/uL (ref 15–500)
Eosinophils Relative: 1 %
HEMATOCRIT: 39.1 % (ref 35.0–45.0)
HEMOGLOBIN: 13 g/dL (ref 11.7–15.5)
LYMPHS ABS: 1881 {cells}/uL (ref 850–3900)
Lymphocytes Relative: 57 %
MCH: 30.5 pg (ref 27.0–33.0)
MCHC: 33.2 g/dL (ref 32.0–36.0)
MCV: 91.8 fL (ref 80.0–100.0)
MONO ABS: 165 {cells}/uL — AB (ref 200–950)
MPV: 9.1 fL (ref 7.5–12.5)
Monocytes Relative: 5 %
NEUTROS ABS: 1188 {cells}/uL — AB (ref 1500–7800)
Neutrophils Relative %: 36 %
Platelets: 275 10*3/uL (ref 140–400)
RBC: 4.26 MIL/uL (ref 3.80–5.10)
RDW: 14 % (ref 11.0–15.0)
WBC: 3.3 10*3/uL — ABNORMAL LOW (ref 3.8–10.8)

## 2016-10-12 LAB — COMPLETE METABOLIC PANEL WITH GFR
ALT: 15 U/L (ref 6–29)
AST: 21 U/L (ref 10–35)
Albumin: 4.3 g/dL (ref 3.6–5.1)
Alkaline Phosphatase: 54 U/L (ref 33–130)
BILIRUBIN TOTAL: 0.6 mg/dL (ref 0.2–1.2)
BUN: 8 mg/dL (ref 7–25)
CALCIUM: 9.7 mg/dL (ref 8.6–10.4)
CO2: 25 mmol/L (ref 20–31)
CREATININE: 0.94 mg/dL (ref 0.50–1.05)
Chloride: 102 mmol/L (ref 98–110)
GFR, EST AFRICAN AMERICAN: 80 mL/min (ref >=60)
GFR, Est Non African American: 69 mL/min (ref >=60)
Glucose, Bld: 77 mg/dL (ref 65–99)
Potassium: 3.9 mmol/L (ref 3.5–5.3)
Sodium: 140 mmol/L (ref 135–146)
TOTAL PROTEIN: 7.7 g/dL (ref 6.1–8.1)

## 2016-10-12 LAB — ANTI-NUCLEAR AB-TITER (ANA TITER)

## 2016-10-12 LAB — CYCLIC CITRUL PEPTIDE ANTIBODY, IGG: CYCLIC CITRULLIN PEPTIDE AB: 112 U — AB

## 2016-10-12 LAB — URINALYSIS, ROUTINE W REFLEX MICROSCOPIC
BILIRUBIN URINE: NEGATIVE
Glucose, UA: NEGATIVE
Hgb urine dipstick: NEGATIVE
KETONES UR: NEGATIVE
Leukocytes, UA: NEGATIVE
NITRITE: NEGATIVE
PH: 7.5 (ref 5.0–8.0)
Protein, ur: NEGATIVE
Specific Gravity, Urine: 1.013 (ref 1.001–1.035)

## 2016-10-12 LAB — ANTI-DNA ANTIBODY, DOUBLE-STRANDED: ds DNA Ab: 3 IU/mL

## 2016-10-12 LAB — C3 AND C4
C3 Complement: 179 mg/dL (ref 83–193)
C4 COMPLEMENT: 28 mg/dL (ref 15–57)

## 2016-10-12 LAB — ANA: Anti Nuclear Antibody(ANA): POSITIVE — AB

## 2016-10-12 LAB — SEDIMENTATION RATE: Sed Rate: 16 mm/hr (ref 0–30)

## 2016-10-12 NOTE — Progress Notes (Signed)
Labs stable

## 2016-11-06 ENCOUNTER — Encounter: Payer: Self-pay | Admitting: Neurology

## 2016-11-06 ENCOUNTER — Ambulatory Visit (INDEPENDENT_AMBULATORY_CARE_PROVIDER_SITE_OTHER): Payer: BLUE CROSS/BLUE SHIELD | Admitting: Neurology

## 2016-11-06 VITALS — BP 160/92 | HR 94 | Temp 99.0°F | Resp 18 | Ht 64.0 in | Wt 181.2 lb

## 2016-11-06 DIAGNOSIS — G4486 Cervicogenic headache: Secondary | ICD-10-CM

## 2016-11-06 DIAGNOSIS — M542 Cervicalgia: Secondary | ICD-10-CM

## 2016-11-06 DIAGNOSIS — R200 Anesthesia of skin: Secondary | ICD-10-CM | POA: Diagnosis not present

## 2016-11-06 DIAGNOSIS — R51 Headache: Secondary | ICD-10-CM

## 2016-11-06 MED ORDER — GABAPENTIN 300 MG PO CAPS: 300.0000 mg | ORAL_CAPSULE | Freq: Three times a day (TID) | ORAL | 3 refills | Status: DC

## 2016-11-06 NOTE — Progress Notes (Signed)
NEUROLOGY FOLLOW UP OFFICE NOTE  Tina [name] [id number]  HISTORY OF PRESENT ILLNESS: I had the pleasure of seeing Tina [name] in follow-up in the neurology clinic on 11/06/2016.  The patient was last seen 1 year ago for new onset headaches localized over the right hemisphere, suggestive of cervicogenic headaches. She had increased gabapentin back to 300mg  TID and was referred for physical therapy and has had excellent response. She continues to do well, with only a couple of mild headaches. She only needed to take Tylenol once last week, with good effect. She continues to have neck pain and does HEP almost daily. She also does massage. She has had tingling on the left arm, with difficulty closing her hand, feeling weak. Legs are unaffected. She denies any bowel/bladder dysfunction, no falls.   HPI 02/10/2015: This is a pleasant 56 yo RH woman with a history of breast cancer s/p radiation and chemotherapy, hypertension, polyarthralgia, presenting for evaluation of new onset headaches. She states she has had tension or sinus headaches in the past, but a couple of months ago started having a different kind of headache with pain on the right hemisphere radiating down the right side of her face and down her neck. She sometimes feels the gums on her right side feel sore. She describes a pressure-like pain, with occasional brief sharp, stabbing pains. There is occasional tingling in her scalp on the right side. Headache intensity can go up to a 10/10 where she would have nausea and vomiting, one time it appears she got cold and clammy and possibly briefly passed out. Headaches occur every 2 weeks or so, but would last for several days. She reports that she had a headache daily for 2 weeks until last week. She was feeling better for a week, then again has a mild headache today. This is associated with neck pain, and she reports that shoulder and neck pain have been bothering her on and off for years, but  this is the worst it has been. She has seen her rheumatologist and told she may have fibromyalgia.  She denies any vision changes, no visual obscurations, conjunctival injection. She denies any diplopia, dysarthria, dysphagia, focal weakness, bowel/bladder incontinence. She has been told she has fluid in the right ear, and has occasional high-pitched tinnitus. She has intermittent numbness in her left arm on a daily basis. She has chronic back pain and states Flexeril did not help, but she would use marijuana and this relaxes her and relieves the pain. She had been taking gabapentin 300mg  TID for a year for neuropathy secondary to chemo, and had self-reduced it a couple of months ago to 100mg  TID. She reports taking prn Ativan for nausea. She reports sleep is fine, she had a sleep study negative for sleep apnea. She denies any history of head injuries or recent infections, no family history of headaches.   I personally reviewed MRI brain with and without contrast which was normal, no abnormal enhancement seen. Her MRI C-spine showed degenerative change from C3-4 through C7-T1. At C4-5, there is a central disc herniation that contacts the ventral aspect of the cord but does not frankly compress or show any cord edema. There is mild foraminal narrowing on the left at C5-6 and bilaterally at C6-7, but no definite compression of the exiting nerve roots. C7-T1 shows a shallow broad-based disc herniation but without visible nerve compression.  PAST MEDICAL HISTORY: Past Medical History:  Diagnosis Date  . Anxiety   . Breast cancer ([place])  2015   lumpectomy  . Cancer of right breast ([place]) 09/03/13   Invasive Ductal Carcinoma/Ductal Carcinoma Insitu  biopsies  . Chronic low back pain 01/24/2016   -and R hip pain -seen by rheumatologist and ortho in the past -MRI with Dr. Lorin Mercy in 2015, R hip plain films in 2015 as well acupuncture, injections, tramadol not helpful [facility] PT  helpful   . Degenerative joint disease    back neck and shoulder  . Fibromyalgia    Dr. Estanislado Pandy  . GERD (gastroesophageal reflux disease)    hx esophagitis  . Hot flashes   . Hypertension   . Leukopenia 06/16/2013  . Lupus    rheumatologist - Dr. Estanislado Pandy  . Migraines   . Neuromuscular disorder ([place])    neuropathy from chemo in feet/hands  . Personal history of chemotherapy    2015  . Personal history of radiation therapy 2015  . S/P radiation therapy 04/27/2014-06/07/2014   1) Right breast, 50 Gy in 25 fractions/ 2) Right breast boost, 10 Gy in 5 fractions    MEDICATIONS: Current Outpatient Prescriptions on File Prior to Visit  Medication Sig Dispense Refill  . aspirin 81 MG tablet Take 81 mg by mouth daily.    Marland Kitchen b complex vitamins capsule Take 1 capsule by mouth daily.    Marland Kitchen BIOTIN 5000 PO Take 1 capsule by mouth daily.     . Calcium Carbonate-Vit D-Min (CALTRATE 600+D PLUS MINERALS PO) Take 1 tablet by mouth daily.    [name] Calcium (STOOL SOFTENER PO) Take by mouth as needed.    . gabapentin (NEURONTIN) 300 MG capsule Take 1 capsule (300 mg total) by mouth 3 (three) times daily. 270 capsule 3  . hydroxychloroquine (PLAQUENIL) 200 MG tablet Take 200 mg by mouth daily. One in am one half in pm    . lisinopril-hydrochlorothiazide (PRINZIDE,ZESTORETIC) 20-12.5 MG tablet Take 0.5 tablets by mouth daily. 90 tablet 1  . loratadine (CLARITIN) 10 MG tablet Take 10 mg by mouth daily.    Marland Kitchen MAGNESIUM PO Take by mouth daily.    Marland Kitchen omeprazole (PRILOSEC) 40 MG capsule Take 1 capsule (40 mg total) by mouth daily. 90 capsule 1  . Polyethylene Glycol 3350 (MIRALAX PO) Take by mouth as needed.    . SUMAtriptan (IMITREX) 25 MG tablet Take 1 tablet (25 mg total) by mouth every 2 (two) hours as needed for migraine. May repeat in 2 hours if needed. Do not take more then 2 tablets in 24 hours. 10 tablet 0  . tamoxifen (NOLVADEX) 20 MG tablet TAKE 1 TABLET DAILY 90 tablet 3  . tiZANidine  (ZANAFLEX) 2 MG tablet Take 1 tablet (2 mg total) by mouth every 8 (eight) hours as needed for muscle spasms. (Patient not taking: Reported on 10/11/2016) 30 tablet 0  . TURMERIC PO Take by mouth daily.    Marland Kitchen venlafaxine XR (EFFEXOR-XR) 75 MG 24 hr capsule TAKE 1 CAPSULE DAILY WITH BREAKFAST 90 capsule 2  . VITAMIN E EX Apply topically. Vaginal suppository     Current Facility-Administered Medications on File Prior to Visit  Medication Dose Route Frequency Provider Last Rate Last Dose  . prochlorperazine (COMPAZINE) injection 10 mg  10 mg Intravenous Q6H PRN Gardenia Phlegm, NP   10 mg at 12/26/13 5364    ALLERGIES: Allergies  Allergen Reactions  . Adhesive [Tape] Itching    Burned and scarred skinned  . Other     Cholroprep-causes rash  .  Nickel Rash    FAMILY HISTORY: Family History  Problem Relation Age of Onset  . Hypertension Mother   . Dementia Mother     small vessel disease  . Cancer Mother 30    cervical  cancer  . Cancer Father 37    prostate ca  . Diabetes Father   . Arthritis Father     SOCIAL HISTORY: Social History   Social History  . Marital status: Married    Spouse name: N/A  . Number of children: 1  . Years of education: N/A   Occupational History  .      homemaker   Social History Main Topics  . Smoking status: Former Smoker    Packs/day: 1.00    Years: 15.00    Types: Cigars, Cigarettes    Quit date: 02/23/2004  . Smokeless tobacco: Never Used     Comment: remote smoking history  . Alcohol use 0.0 oz/week     Comment: occ   . Drug use: Yes     Comment: Mar[place]ana  . Sexual activity: Yes   Other Topics Concern  . 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      Home Situation: lives with husband and 60 yo son      Spiritual Beliefs: Christian      Lifestyle: elliptical 3-4 times per week; working on diet as well over last year in 2014             REVIEW OF SYSTEMS: Constitutional: No fevers,  chills, or sweats, no generalized fatigue, change in appetite Eyes: No visual changes, double vision, eye pain Ear, nose and throat: No hearing loss, ear pain, nasal congestion, sore throat Cardiovascular: No chest pain, palpitations Respiratory:  No shortness of breath at rest or with exertion, wheezes GastrointestinaI: No nausea, vomiting, diarrhea, abdominal pain, fecal incontinence Genitourinary:  No dysuria, urinary retention or frequency Musculoskeletal:  + neck pain, back pain Integumentary: No rash, pruritus, skin lesions Neurological: as above Psychiatric: No depression, insomnia, anxiety Endocrine: No palpitations, fatigue, diaphoresis, mood swings, change in appetite, change in weight, increased thirst Hematologic/Lymphatic:  No anemia, purpura, petechiae. Allergic/Immunologic: no itchy/runny eyes, nasal congestion, recent allergic reactions, rashes  PHYSICAL EXAM: Vitals:   11/06/16 1604  BP: (!) 160/92  Pulse: 94  Resp: 18  Temp: 99 F (37.2 C)   General: No acute distress Head:  Normocephalic/atraumatic Neck: supple, no paraspinal tenderness, full range of motion Heart:  Regular rate and rhythm Lungs:  Clear to auscultation bilaterally Back: No paraspinal tenderness Skin/Extremities: No rash, no edema Neurological Exam: alert and oriented to person, place, and time. No aphasia or dysarthria. Fund of knowledge is appropriate.  Recent and remote memory are intact.  Attention and concentration are normal.    Able to name objects and repeat phrases. Cranial nerves: Pupils equal, round, reactive to light. Extraocular movements intact with no nystagmus. Visual fields full. Facial sensation intact. No facial asymmetry. Tongue, uvula, palate midline.  Motor: Bulk and tone normal, muscle strength 5/5 throughout with no pronator drift, slight weakness of bilateral APB L>R. Sensation to light touch intact.  No extinction to double simultaneous stimulation.  Deep tendon reflexes 2+  throughout, toes downgoing.  Finger to nose testing intact.  Gait narrow-based and steady, able to tandem walk adequately.  Romberg negative. Negative Tinel and Phalen signs.  IMPRESSION: This is a pleasant 56 yo RH woman with a history of breast cancer s/p  radiation and chemotherapy, hypertension, polyarthralgia, who presented with new onset headaches localized over the right side, with significant neck pain, suggestive of cervicogenic headaches. Her neurological exam and MRI brain are normal. She has had excellent response to PT and increased gabapentin dose 300mg  TID with no side effects. She denies any significant headaches in the past year, continue Gabapentin 300mg  TID and home neck exercises. She is noticing more numbness and tingling in her left arm, with left hand weakness. MRI cervical spine did show degenerative changes with mild foraminal narrowing on the left at C5-6 and bilaterally at C6-7, but no definite compression of exiting nerve roots. There was a shallow broad-based disc herniation at C7-T1. She will be scheduled for an EMG/NCV of the left UE, depending on results, we may consider neurosurgery eval. She will follow-up in 1 year and knows to call our office for any changes.  Thank you for allowing me to participate in her care.  Please do not hesitate to call for any questions or concerns.  The duration of this appointment visit was 25 minutes of face-to-face time with the patient.  Greater than 50% of this time was spent in counseling, explanation of diagnosis, planning of further management, and coordination of care.   Ellouise Newer, M.D.   CC: Dr. Maudie Mercury

## 2016-11-06 NOTE — Patient Instructions (Signed)
1. Schedule EMG/NCV of the left upper extremity with Dr. Posey Pronto 2. Continue gabapentin 300mg  three times a day 3. Continue neck exercises 4. Follow-up in 1 year, call for any changes

## 2016-11-11 ENCOUNTER — Encounter: Payer: Self-pay | Admitting: Neurology

## 2016-11-20 ENCOUNTER — Ambulatory Visit (INDEPENDENT_AMBULATORY_CARE_PROVIDER_SITE_OTHER): Payer: BLUE CROSS/BLUE SHIELD | Admitting: Neurology

## 2016-11-20 DIAGNOSIS — R200 Anesthesia of skin: Secondary | ICD-10-CM

## 2016-11-20 DIAGNOSIS — G5602 Carpal tunnel syndrome, left upper limb: Secondary | ICD-10-CM

## 2016-11-20 NOTE — Procedures (Signed)
[facility]  [address], [address]  [place], [place] [address] Tel: [phone] Fax:  [phone] Test Date:  11/20/2016  Patient: Tina [name] DOB: [birth year] Physician: Narda Amber, DO  Sex: Female Height: 5\' 4"  Ref Phys: Ellouise Newer, M.D.  ID#: [id number] Temp: 37.5C Technician:    Patient Complaints: This is a 56 year-old female referred for evaluation of left arm pain and paresthesias.  NCV & EMG Findings: Extensive electrodiagnostic testing of the left upper extremity shows:  1. Left median sensory response shows prolonged distal peak latency (5.4 ms) and reduced amplitude (7.7 V).  Left ulnar sensory response is within normal limits. 2. Left median motor response shows showed prolonged distal onset latency (4.8 ms).  Left ulnar motor responses within normal limits.  3. Chronic motor axon loss changes are isolated to the left abductor pollicis brevis muscle, without accompanied active denervation.   Impression: Left median neuropathy at or distal to the wrist, consistent with clinical diagnosis of carpal tunnel syndrome. Overall, these findings are moderate-to-severe in degree electrically.   ___________________________ Narda Amber, DO    Nerve Conduction Studies Anti Sensory Summary Table   Stim Site NR Peak (ms) Norm Peak (ms) P-T Amp (V) Norm P-T Amp  Left Median Anti Sensory (2nd Digit)  37.5C  Wrist    5.4 <3.6 7.7 >15  Left Ulnar Anti Sensory (5th Digit)  37.5C  Wrist    2.3 <3.1 19.2 >10   Motor Summary Table   Stim Site NR Onset (ms) Norm Onset (ms) O-P Amp (mV) Norm O-P Amp Site1 Site2 Delta-0 (ms) Dist (cm) Vel (m/s) Norm Vel (m/s)  Left Median Motor (Abd Poll Brev)  37.5C  Wrist    4.8 <4.0 8.3 >6 Elbow Wrist 4.6 27.0 59 >50  Elbow    9.4  7.6         Left Ulnar Motor (Abd Dig Minimi)  37.5C  Wrist    1.9 <3.1 7.6 >7 B Elbow Wrist 3.8 23.0 61 >50  B Elbow    5.7  7.6  A Elbow B Elbow 1.6 10.0 63 >50  A Elbow    7.3  7.1           EMG   Side Muscle Ins Act Fibs Psw Fasc Number Recrt Dur Dur. Amp Amp. Poly Poly. Comment  Left 1stDorInt Nml Nml Nml Nml Nml Nml Nml Nml Nml Nml Nml Nml N/A  Left Ext Indicis Nml Nml Nml Nml Nml Nml Nml Nml Nml Nml Nml Nml N/A  Left PronatorTeres Nml Nml Nml Nml Nml Nml Nml Nml Nml Nml Nml Nml N/A  Left Biceps Nml Nml Nml Nml Nml Nml Nml Nml Nml Nml Nml Nml N/A  Left Triceps Nml Nml Nml Nml Nml Nml Nml Nml Nml Nml Nml Nml N/A  Left Deltoid Nml Nml Nml Nml Nml Nml Nml Nml Nml Nml Nml Nml N/A  Left Abd Poll Brev Nml Nml Nml Nml 1- Rapid Some 1+ Some 1+ Nml Nml N/A      Waveforms:

## 2016-12-07 ENCOUNTER — Other Ambulatory Visit: Payer: Self-pay | Admitting: Family Medicine

## 2016-12-11 ENCOUNTER — Encounter (HOSPITAL_COMMUNITY): Payer: Self-pay

## 2016-12-11 ENCOUNTER — Emergency Department (HOSPITAL_COMMUNITY)
Admission: EM | Admit: 2016-12-11 | Discharge: 2016-12-12 | Disposition: A | Discharge status: Home / Self Care | Payer: BLUE CROSS/BLUE SHIELD | Attending: Emergency Medicine | Admitting: Emergency Medicine

## 2016-12-11 ENCOUNTER — Emergency Department (HOSPITAL_COMMUNITY): Payer: BLUE CROSS/BLUE SHIELD

## 2016-12-11 DIAGNOSIS — Y939 Activity, unspecified: Secondary | ICD-10-CM | POA: Insufficient documentation

## 2016-12-11 DIAGNOSIS — I10 Essential (primary) hypertension: Secondary | ICD-10-CM | POA: Diagnosis not present

## 2016-12-11 DIAGNOSIS — S161XXA Strain of muscle, fascia and tendon at neck level, initial encounter: Secondary | ICD-10-CM | POA: Diagnosis not present

## 2016-12-11 DIAGNOSIS — Z87891 Personal history of nicotine dependence: Secondary | ICD-10-CM | POA: Diagnosis not present

## 2016-12-11 DIAGNOSIS — Y999 Unspecified external cause status: Secondary | ICD-10-CM | POA: Insufficient documentation

## 2016-12-11 DIAGNOSIS — Z79899 Other long term (current) drug therapy: Secondary | ICD-10-CM | POA: Insufficient documentation

## 2016-12-11 DIAGNOSIS — Y92410 Unspecified street and highway as the place of occurrence of the external cause: Secondary | ICD-10-CM | POA: Insufficient documentation

## 2016-12-11 DIAGNOSIS — M542 Cervicalgia: Secondary | ICD-10-CM | POA: Diagnosis not present

## 2016-12-11 DIAGNOSIS — Z7982 Long term (current) use of aspirin: Secondary | ICD-10-CM | POA: Insufficient documentation

## 2016-12-11 DIAGNOSIS — Z853 Personal history of malignant neoplasm of breast: Secondary | ICD-10-CM | POA: Diagnosis not present

## 2016-12-11 DIAGNOSIS — S199XXA Unspecified injury of neck, initial encounter: Secondary | ICD-10-CM | POA: Diagnosis not present

## 2016-12-11 NOTE — ED Notes (Signed)
Bed: WTR5 Expected date:  Expected time:  Means of arrival:  Comments: Pt in room 
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
## 2016-12-12 DIAGNOSIS — M542 Cervicalgia: Secondary | ICD-10-CM | POA: Diagnosis not present

## 2016-12-12 DIAGNOSIS — S199XXA Unspecified injury of neck, initial encounter: Secondary | ICD-10-CM | POA: Diagnosis not present

## 2016-12-12 MED ORDER — IBUPROFEN 800 MG PO TABS: 800.0000 mg | ORAL_TABLET | Freq: Three times a day (TID) | ORAL | 0 refills | Status: DC | PRN

## 2016-12-12 MED ORDER — TRAMADOL HCL 50 MG PO TABS: 50.0000 mg | ORAL_TABLET | Freq: Four times a day (QID) | ORAL | 0 refills | Status: DC | PRN

## 2016-12-12 NOTE — ED Notes (Signed)
Pt ambulatory and independent at discharge.  Verbalized understanding of discharge instructions 

## 2016-12-12 NOTE — Discharge Instructions (Signed)
Return here as needed. Your x-rays did not show any acute abnormality. Use ice and heat on your neck. You will be more sore tomorrow and over the next 10-14 days. Follow up with your doctor for a recheck.
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
## 2016-12-27 ENCOUNTER — Ambulatory Visit: Payer: Self-pay | Admitting: Family Medicine

## 2017-01-01 ENCOUNTER — Ambulatory Visit (INDEPENDENT_AMBULATORY_CARE_PROVIDER_SITE_OTHER): Payer: Self-pay | Admitting: Family Medicine

## 2017-01-01 ENCOUNTER — Encounter: Payer: Self-pay | Admitting: Family Medicine

## 2017-01-01 VITALS — BP 100/70 | HR 88 | Temp 98.9°F | Ht 64.0 in | Wt 180.5 lb

## 2017-01-01 DIAGNOSIS — G8929 Other chronic pain: Secondary | ICD-10-CM

## 2017-01-01 DIAGNOSIS — M545 Low back pain, unspecified: Secondary | ICD-10-CM

## 2017-01-01 DIAGNOSIS — M542 Cervicalgia: Secondary | ICD-10-CM

## 2017-01-01 DIAGNOSIS — M797 Fibromyalgia: Secondary | ICD-10-CM
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
## 2017-01-01 NOTE — Patient Instructions (Signed)
BEFORE YOU LEAVE: -follow up: 2-3 months for CPE, come fasting if possible -neck spasm exercises  -We placed a referral for you as discussed to physical therapy. It usually takes about 1-2 weeks to process and schedule this referral. If you have not heard from [place] regarding this appointment in 2 weeks please contact our office.  I hope you are feeling better soon! Seek care sooner if worsening, new concerns or you are not improving with treatment.

## 2017-01-10 ENCOUNTER — Ambulatory Visit: Payer: BLUE CROSS/BLUE SHIELD | Attending: Family Medicine

## 2017-01-10 DIAGNOSIS — R252 Cramp and spasm: Secondary | ICD-10-CM | POA: Diagnosis not present

## 2017-01-10 DIAGNOSIS — M545 Low back pain, unspecified: Secondary | ICD-10-CM

## 2017-01-10 DIAGNOSIS — M542 Cervicalgia: Secondary | ICD-10-CM | POA: Diagnosis not present

## 2017-01-10 DIAGNOSIS — G8929 Other chronic pain: Secondary | ICD-10-CM | POA: Insufficient documentation

## 2017-01-10 NOTE — Patient Instructions (Addendum)
Piriformis (Supine)  Cross legs, right on top. Gently pull other knee toward chest until stretch is felt in buttock/hip of top leg. Hold 20____ seconds. Repeat _3___ times per set. Do _1___ sets per session. Do _3___ sessions per day.  Hip Stretch  Put right ankle over left knee. Let right knee fall downward, but keep ankle in place. Feel the stretch in hip. May push down gently with hand to feel stretch. Hold _20___ seconds while counting out loud. Repeat with other leg. Repeat _3___ times. Do _3___ sessions per day.     Stretching: Piriformis (Supine)  Pull right knee toward opposite shoulder. Hold _20___ seconds. Relax. Repeat _3___ times per set. Do __1__ sets per session. Do _3___ sessions per day.   Angry Cat Stretch  Tuck chin and tighten stomach, arching back. Repeat __5-10__ times per set.  Do _1-2___ sessions per day.   Side Waist Stretch from Child's Pose  From child's pose, walk hands to left. Reach right hand out on diagonal. Reach hips back toward heels making a C with torso. Breathe into right side waist. Hold for _20___ breaths. Repeat _3___ times each side.  1-2 times a day.  Copyright  VHI. All rights reserved.    Cervico-Thoracic: Extension / Rotation (Sitting)    Reach across body with left arm and grasp back of chair. Gently look over right side shoulder. Hold _20___ seconds. Relax. Repeat _3___ times per set. Do ___1_ sets per session. Do _3___ sessions per day.  http://orth.exer.us/981   Copyright  VHI. All rights reserved.  PERFORM ALL EXERCISES GENTLY AND WITH GOOD POSTURE.    20 SECOND HOLD, 3 REPS TO EACH SIDE. 3 TIMES EACH DAY.   AROM: Neck Rotation   Turn head slowly to look over one shoulder, then the other.   AROM: Neck Flexion   Bend head forward.   AROM: Lateral Neck Flexion   Slowly tilt head toward one shoulder, then the other.   [facility] [address] [place], [place] [address] Phone #  [phone] Fax [phone]
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
## 2017-01-23 ENCOUNTER — Ambulatory Visit: Payer: BLUE CROSS/BLUE SHIELD | Admitting: Physical Therapy

## 2017-01-23 ENCOUNTER — Encounter: Payer: Self-pay | Admitting: Physical Therapy

## 2017-01-23 DIAGNOSIS — G8929 Other chronic pain: Secondary | ICD-10-CM

## 2017-01-23 DIAGNOSIS — M545 Low back pain, unspecified: Secondary | ICD-10-CM

## 2017-01-23 DIAGNOSIS — R252 Cramp and spasm: Secondary | ICD-10-CM

## 2017-01-23 DIAGNOSIS — M542 Cervicalgia: Secondary | ICD-10-CM

## 2017-01-23 NOTE — Therapy (Signed)
[facility] [address] [place], [place], [address] Phone: [phone]   Fax:  [phone]  Physical Therapy Treatment  Patient Details  Name: Tina [name] MRN: [id number] Date of Birth: [birth year] Referring Provider: Colin Benton, MD  Encounter Date: 01/23/2017      PT End of Session - 01/23/17 1537    Visit Number 2   Date for PT Re-Evaluation 03/07/17   PT Start Time 6387   PT Stop Time 1613   PT Time Calculation (min) 39 min   Activity Tolerance Patient tolerated treatment well   Behavior During Therapy [facility] for tasks assessed/performed      Past Medical History:  Diagnosis Date  . Anxiety   . Breast cancer ([place]) 2015   lumpectomy  . Cancer of right breast ([place]) 09/03/13   Invasive Ductal Carcinoma/Ductal Carcinoma Insitu  biopsies  . Chronic low back pain 01/24/2016   -and R hip pain -seen by rheumatologist and ortho in the past -MRI with Dr. Lorin Mercy in 2015, R hip plain films in 2015 as well acupuncture, injections, tramadol not helpful trigger therapy at [facility] and PT helpful   . Degenerative joint disease    back neck and shoulder  . Fibromyalgia    Dr. Estanislado Pandy  . GERD (gastroesophageal reflux disease)    hx esophagitis  . Hot flashes   . Hypertension   . Leukopenia 06/16/2013  . Lupus    rheumatologist - Dr. Estanislado Pandy  . Migraines   . Neuromuscular disorder ([place])    neuropathy from chemo in feet/hands  . Personal history of chemotherapy    2015  . Personal history of radiation therapy 2015  . S/P radiation therapy 04/27/2014-06/07/2014   1) Right breast, 50 Gy in 25 fractions/ 2) Right breast boost, 10 Gy in 5 fractions    Past Surgical History:  Procedure Laterality Date  . BREAST BIOPSY Left 2015   benign core  . BREAST BIOPSY Right 2015   malignant core   . BREAST SURGERY  03/01/2014   hx right breast cancer- 3 lymph nodes removed and lumpectomy  . [place]   rt  . CESAREAN SECTION     x 3  . PORTACATH PLACEMENT  08/2013   still currently active   . ROBOTIC ASSISTED BILATERAL SALPINGO OOPHERECTOMY Bilateral 07/09/2014   Procedure: ROBOTIC ASSISTED BILATERAL SALPINGO OOPHORECTOM; :LYSIS OF ADHESIONS;  Surgeon: Marvene Staff, MD;  Location: [facility];  Service: Gynecology;  Laterality: Bilateral;  . TRIGGER FINGER RELEASE  2004   rt thumb  . trigger finger release left thumb  06/28/14   at orthopedic surgical center  . TUBAL LIGATION    . WISDOM TOOTH EXTRACTION      There were no vitals filed for this visit.      Subjective Assessment - 01/23/17 1540    Subjective Pt reports a little discomfort in the LT upper trap.   Currently in Pain? Yes  Lt upper trap   Pain Score 3    Pain Orientation Left   Aggravating Factors  looking down reprtitively   Pain Relieving Factors streching, heat,                          [facility] Adult PT Treatment/Exercise - 01/23/17 0001      Neck Exercises: Seated   Other Seated Exercise Theracane trigger point release protocol   Including static stretches with overpressure from  theracane     Neck Exercises: Supine   Other Supine Exercise Cervical & Lumbar  Melt Method protocol review: Pt will do at home.                   PT Short Term Goals - 01/23/17 1545      PT SHORT TERM GOAL #3   Title report a 30% reduction in neck pain with computer and phone use   Time 4   Period Weeks     PT SHORT TERM GOAL #4   Title report 30% reduction in LBP with standing and walking   Time 4   Period Weeks   Status On-going           PT Long Term Goals - 01/10/17 1011      PT LONG TERM GOAL #1   Title be independent in advanced HEP   Time 8     PT LONG TERM GOAL #2   Title reduce FOTO to < or = to 39% limitation   Time 8   Period Weeks   Status New     PT LONG TERM GOAL #3   Title report a 60% reduction in neck pain with computer and phone use   Time 8    Period Weeks   Status New     PT LONG TERM GOAL #4   Title report a 60% reduction in LBP with standing and walking   Time 8   Status New     PT LONG TERM GOAL #5   Title **               Plan - 01/23/17 1551    Clinical Impression Statement Pt presents with only LT upper trap discomfort. This was relieved with use of the theracane which pt verbally reports is going to purchases. She currently has soft foam roll at home and will follow the Melt method protocol for cervical and lumbar as part of her HEP. These techniques were reviwed in session today.  Post session pt reporting feeling very good/relaxed.   Rehab Potential Good   PT Frequency 2x / week   PT Duration 8 weeks   PT Treatment/Interventions ADLs/Self Care Home Management;Cryotherapy;Electrical Stimulation;Functional mobility training;Moist Heat;Therapeutic activities;Therapeutic exercise;Neuromuscular re-education;Patient/family education;Manual techniques;Dry needling;Taping   PT Next Visit Plan Followup with her HEP, body mechanics ed next. Manual if time allows   Consulted and Agree with Plan of Care Patient      Patient will benefit from skilled therapeutic intervention in order to improve the following deficits and impairments:  Postural dysfunction, Impaired flexibility, Pain, Decreased activity tolerance, Increased muscle spasms  Visit Diagnosis: Cervicalgia  Chronic left-sided low back pain without sciatica  Cramp and spasm     Problem List Patient Active Problem List   Diagnosis Date Noted  . Fibromyalgia 02/28/2016  . Chronic low back pain 01/24/2016  . Lupus 04/19/2015  . GERD (gastroesophageal reflux disease) 04/19/2015  . Cervicogenic headache 02/10/2015  . Neck pain 02/10/2015  . Hot flashes 10/26/2014  . GAD (generalized anxiety disorder) 10/26/2014  . Peripheral neuropathy due to chemotherapy ([place]) 03/12/2014  . Breast cancer of lower-outer quadrant of right female breast ([place])  09/15/2013  . Left shoulder pain - seeing Dr. Lorin Mercy 08/20/2013  . Hypertension     COCHRAN,JENNIFER, PTA 01/23/2017, 4:09 PM  [facility] [address] [place], [place], [address] Phone: [phone]   Fax:  [phone]  Name: Brandolyn Shortridge  MRN: [id number] Date of Birth: August 27, 1960

## 2017-01-28 ENCOUNTER — Encounter: Payer: Self-pay | Admitting: Physical Therapy

## 2017-01-28 ENCOUNTER — Ambulatory Visit: Payer: BLUE CROSS/BLUE SHIELD | Attending: Family Medicine | Admitting: Physical Therapy

## 2017-01-28 DIAGNOSIS — M542 Cervicalgia: Secondary | ICD-10-CM | POA: Diagnosis not present

## 2017-01-28 DIAGNOSIS — G8929 Other chronic pain: Secondary | ICD-10-CM

## 2017-01-28 DIAGNOSIS — R252 Cramp and spasm: Secondary | ICD-10-CM

## 2017-01-28 DIAGNOSIS — M545 Low back pain: Secondary | ICD-10-CM | POA: Insufficient documentation

## 2017-01-28 NOTE — Therapy (Signed)
[facility] [address], [place], [place], [address] Phone: [phone]   Fax:  [phone]  Physical Therapy Treatment  Patient Details  Name: Tina [name] MRN: [id number] Date of Birth: 1961-03-24 Referring Provider: Colin Benton, MD  Encounter Date: 01/28/2017      PT End of Session - 01/28/17 1317    Visit Number 3   Date for PT Re-Evaluation 03/07/17   PT Start Time 1245  came late   PT Stop Time 1315   PT Time Calculation (min) 30 min   Activity Tolerance Patient tolerated treatment well   Behavior During Therapy [facility] for tasks assessed/performed      Past Medical History:  Diagnosis Date  . Anxiety   . Breast cancer ([place]) 2015   lumpectomy  . Cancer of right breast ([place]) 09/03/13   Invasive Ductal Carcinoma/Ductal Carcinoma Insitu  biopsies  . Chronic low back pain 01/24/2016   -and R hip pain -seen by rheumatologist and ortho in the past -MRI with Dr. Lorin Mercy in 2015, R hip plain films in 2015 as well acupuncture, injections, tramadol not helpful trigger therapy at [facility] center and PT helpful   . Degenerative joint disease    back neck and shoulder  . Fibromyalgia    Dr. Estanislado Pandy  . GERD (gastroesophageal reflux disease)    hx esophagitis  . Hot flashes   . Hypertension   . Leukopenia 06/16/2013  . Lupus    rheumatologist - Dr. Estanislado Pandy  . Migraines   . Neuromuscular disorder ([place])    neuropathy from chemo in feet/hands  . Personal history of chemotherapy    2015  . Personal history of radiation therapy 2015  . S/P radiation therapy 04/27/2014-06/07/2014   1) Right breast, 50 Gy in 25 fractions/ 2) Right breast boost, 10 Gy in 5 fractions    Past Surgical History:  Procedure Laterality Date  . BREAST BIOPSY Left 2015   benign core  . BREAST BIOPSY Right 2015   malignant core   . BREAST SURGERY  03/01/2014   hx right breast cancer- 3 lymph nodes removed and lumpectomy  . [place]   rt  . CESAREAN SECTION     x 3  . PORTACATH PLACEMENT  08/2013   still currently active   . ROBOTIC ASSISTED BILATERAL SALPINGO OOPHERECTOMY Bilateral 07/09/2014   Procedure: ROBOTIC ASSISTED BILATERAL SALPINGO OOPHORECTOM; :LYSIS OF ADHESIONS;  Surgeon: Marvene Staff, MD;  Location: [facility];  Service: Gynecology;  Laterality: Bilateral;  . TRIGGER FINGER RELEASE  2004   rt thumb  . trigger finger release left thumb  06/28/14   at orthopedic surgical center  . TUBAL LIGATION    . WISDOM TOOTH EXTRACTION      There were no vitals filed for this visit.      Subjective Assessment - 01/28/17 1245    Subjective I had a puppy emergency so that is why I am late. The Melt method is helping me.    Pertinent History history of breast cancer, Lt carpal tunnel syndrome, fibromyalgia   Limitations Sitting;Walking   How long can you sit comfortably? 1 hour   How long can you walk comfortably? 1 hour   Diagnostic tests x-ray: negative   Patient Stated Goals reduce pain   Currently in Pain? Yes   Pain Score 3    Pain Location Neck   Pain Orientation Left   Pain Descriptors / Indicators Aching;Tightness;Sore   Pain  Onset 1 to 4 weeks ago   Pain Frequency Constant   Aggravating Factors  looking down repetitively   Pain Relieving Factors stretching, heat   Multiple Pain Sites Yes   Pain Score 6   Pain Location Back   Pain Orientation Left   Pain Descriptors / Indicators Aching;Dull   Pain Type Acute pain   Pain Onset 1 to 4 weeks ago   Pain Frequency Constant   Aggravating Factors  standing/walking too long, being still   Pain Relieving Factors yoga, stretching                         [facility] Adult PT Treatment/Exercise - 01/28/17 0001      Neck Exercises: Supine   Other Supine Exercise lay on foam roll with bil. shoulder abduction 20x, alternate shoulder flexion 20x; diagonals 15x both ways   Other Supine Exercise decompression of back and neck  while laying on foam roll for 2 min, followed by chin tucks, cervical rotation     Manual Therapy   Manual Therapy Soft tissue mobilization;Joint mobilization   Joint Mobilization mobilization of left first rib   Soft tissue mobilization left upper trap, left cervical paraspinals                PT Education - 01/28/17 1316    Education provided Yes   Education Details body mechanics with daily tasks   Person(s) Educated Patient   Methods Explanation;Demonstration;Verbal cues;Handout   Comprehension Verbalized understanding;Returned demonstration          PT Short Term Goals - 01/28/17 1250      PT SHORT TERM GOAL #1   Title independent with initial HEP   Time 4   Period Weeks   Status Achieved     PT SHORT TERM GOAL #2   Title demonstrate correct body mechanics and neutral posture and report modifications with home tasks   Time 4   Period Weeks   Status On-going     PT SHORT TERM GOAL #3   Title report a 30% reduction in neck pain with computer and phone use   Time 4   Period Weeks   Status On-going  25% better     PT SHORT TERM GOAL #4   Title report 30% reduction in LBP with standing and walking   Time 4   Period Weeks   Status On-going  25% better           PT Long Term Goals - 01/10/17 1011      PT LONG TERM GOAL #1   Title be independent in advanced HEP   Time 8     PT LONG TERM GOAL #2   Title reduce FOTO to < or = to 39% limitation   Time 8   Period Weeks   Status New     PT LONG TERM GOAL #3   Title report a 60% reduction in neck pain with computer and phone use   Time 8   Period Weeks   Status New     PT LONG TERM GOAL #4   Title report a 60% reduction in LBP with standing and walking   Time 8   Status New     PT LONG TERM GOAL #5   Title **               Plan - 01/28/17 1317    Clinical Impression Statement Patient reports her pain is 25%  better.  She is doing the MELT method at home and it is helping. Patient  has trigger points in the left upper trap and cervical paraspinals.  She felt better after therapy.  Patient will benefit from skilled therapy to reduce pain and muscle spasms so she is able to do daily tasks with less difficulty.    Rehab Potential Good   PT Frequency 2x / week   PT Duration 8 weeks   PT Treatment/Interventions ADLs/Self Care Home Management;Cryotherapy;Electrical Stimulation;Functional mobility training;Moist Heat;Therapeutic activities;Therapeutic exercise;Neuromuscular re-education;Patient/family education;Manual techniques;Dry needling;Taping   PT Next Visit Plan Dry needling to left upper trap and cervical multifidi; Cervical and upper thoracic joint mobilization; work on back pain   PT Home Exercise Plan progress as needed   Recommended Other Services cert signed on 0/04/711   Consulted and Agree with Plan of Care Patient      Patient will benefit from skilled therapeutic intervention in order to improve the following deficits and impairments:  Postural dysfunction, Impaired flexibility, Pain, Decreased activity tolerance, Increased muscle spasms  Visit Diagnosis: Cervicalgia  Chronic left-sided low back pain without sciatica  Cramp and spasm     Problem List Patient Active Problem List   Diagnosis Date Noted  . Fibromyalgia 02/28/2016  . Chronic low back pain 01/24/2016  . Lupus 04/19/2015  . GERD (gastroesophageal reflux disease) 04/19/2015  . Cervicogenic headache 02/10/2015  . Neck pain 02/10/2015  . Hot flashes 10/26/2014  . GAD (generalized anxiety disorder) 10/26/2014  . Peripheral neuropathy due to chemotherapy ([place]) 03/12/2014  . Breast cancer of lower-outer quadrant of right female breast ([place]) 09/15/2013  . Left shoulder pain - seeing Dr. Lorin Mercy 08/20/2013  . Hypertension     Earlie Counts, PT 01/28/17 1:24 PM    [facility] [address] [place], [place], [address] Phone:  [phone]   Fax:  [phone]  Name: Tina [name] MRN: [id number] Date of Birth: July 18, 1961

## 2017-01-28 NOTE — Patient Instructions (Addendum)
Computer Work    Position work to Programmer, multimedia. Use proper work and seat height. Keep shoulders back and down, wrists straight, and elbows at right angles. Use chair that provides full back support. Add footrest and lumbar roll as needed.   Copyright  VHI. All rights reserved.  Computer Work    Position work to Programmer, multimedia. Use proper work and seat height. Keep shoulders back and down, wrists straight, and elbows at right angles. Use chair that provides full back support. Add footrest and lumbar roll as needed.   Copyright  VHI. All rights reserved.    Posture - Sitting    Sit upright, head facing forward. Try using a roll to support lower back. Keep shoulders relaxed, and avoid rounded back. Keep hips level with knees. Avoid crossing legs for long periods.   Copyright  VHI. All rights reserved.  Computer Work    Dance movement psychotherapist" on edge of chair or use a saddle-type seat. Keep natural arch in lower back. Adjust height of table and stool so that you can look straight ahead or slightly down at monitor. Use back support only as necessary. Consider use of computer glasses as needed.  Copyright  VHI. All rights reserved.  Eating    Sit, protecting natural arch in low back. Bring food to mouth, not mouth to food. Do not lean on elbows or arms.  Copyright  VHI. All rights reserved.  Pocketbooks and Shoulder Bags    Avoid heavy shoulder bags. If you use a shoulder bag, keep it light and alternate shoulders to carry it. Try placing the strap across body. Consider using a waist pack or smaller purse. Carry a waist pack in the back, except when it seems safer to have the pack in front. Find a shoulder pad for purse strap.  Copyright  VHI. All rights reserved.  Reading    Sit, protecting natural arch in low back. Consider use of a tilted reading stand. Avoid reading in bed, curled up on sofa, slouched down in recliner or other chair.  Copyright  VHI. All rights reserved.    Walking the Dog    DO: Keep the dog on a short leash close to your body. Keep back straight and hold leash with two hands. Consider use of a head-halter for hard-to-control dogs. DON'T: Let dog pull out ahead, causing you to bend or rotate your back or be pulled off balance.  Copyright  VHI. All rights reserved.  [facility] [address], [place], [place] [address] Phone # [phone] Fax [phone]

## 2017-02-01 ENCOUNTER — Encounter: Payer: Self-pay | Admitting: Physical Therapy

## 2017-02-01 ENCOUNTER — Ambulatory Visit: Payer: BLUE CROSS/BLUE SHIELD | Admitting: Physical Therapy

## 2017-02-01 DIAGNOSIS — G8929 Other chronic pain: Secondary | ICD-10-CM

## 2017-02-01 DIAGNOSIS — M545 Low back pain, unspecified: Secondary | ICD-10-CM

## 2017-02-01 DIAGNOSIS — R252 Cramp and spasm: Secondary | ICD-10-CM

## 2017-02-01 DIAGNOSIS — M542 Cervicalgia: Secondary | ICD-10-CM | POA: Diagnosis not present

## 2017-02-01 NOTE — Therapy (Signed)
[facility] [address] [place], [place], [address] Phone: [phone]   Fax:  [phone]  Physical Therapy Treatment  Patient Details  Name: Tina [name] MRN: [id number] Date of Birth: 08/24/1960 Referring Provider: Colin Benton, MD  Encounter Date: 02/01/2017      PT End of Session - 02/01/17 1054    Visit Number 4   Date for PT Re-Evaluation 03/07/17   Authorization Type BC[employer] 30 visit limit   Authorization - Visit Number 4   Authorization - Number of Visits 30   PT Start Time 1055   PT Stop Time 1135   PT Time Calculation (min) 40 min   Activity Tolerance Patient tolerated treatment well   Behavior During Therapy [facility] for tasks assessed/performed      Past Medical History:  Diagnosis Date  . Anxiety   . Breast cancer ([place]) 2015   lumpectomy  . Cancer of right breast ([place]) 09/03/13   Invasive Ductal Carcinoma/Ductal Carcinoma Insitu  biopsies  . Chronic low back pain 01/24/2016   -and R hip pain -seen by rheumatologist and ortho in the past -MRI with Dr. Lorin Mercy in 2015, R hip plain films in 2015 as well acupuncture, injections, tramadol not helpful trigger therapy at [facility] and PT helpful   . Degenerative joint disease    back neck and shoulder  . Fibromyalgia    Dr. Estanislado Pandy  . GERD (gastroesophageal reflux disease)    hx esophagitis  . Hot flashes   . Hypertension   . Leukopenia 06/16/2013  . Lupus    rheumatologist - Dr. Estanislado Pandy  . Migraines   . Neuromuscular disorder ([place])    neuropathy from chemo in feet/hands  . Personal history of chemotherapy    2015  . Personal history of radiation therapy 2015  . S/P radiation therapy 04/27/2014-06/07/2014   1) Right breast, 50 Gy in 25 fractions/ 2) Right breast boost, 10 Gy in 5 fractions    Past Surgical History:  Procedure Laterality Date  . BREAST BIOPSY Left 2015   benign core  . BREAST BIOPSY Right 2015   malignant core   .  BREAST SURGERY  03/01/2014   hx right breast cancer- 3 lymph nodes removed and lumpectomy  . [place]   rt  . CESAREAN SECTION     x 3  . PORTACATH PLACEMENT  08/2013   still currently active   . ROBOTIC ASSISTED BILATERAL SALPINGO OOPHERECTOMY Bilateral 07/09/2014   Procedure: ROBOTIC ASSISTED BILATERAL SALPINGO OOPHORECTOM; :LYSIS OF ADHESIONS;  Surgeon: Marvene Staff, MD;  Location: [facility];  Service: Gynecology;  Laterality: Bilateral;  . TRIGGER FINGER RELEASE  2004   rt thumb  . trigger finger release left thumb  06/28/14   at orthopedic surgical center  . TUBAL LIGATION    . WISDOM TOOTH EXTRACTION      There were no vitals filed for this visit.      Subjective Assessment - 02/01/17 1057    Subjective I am feeling okay. My pain feels a little bit better since I have been coming.    Pertinent History history of breast cancer, Lt carpal tunnel syndrome, fibromyalgia   Limitations Sitting;Walking   How long can you sit comfortably? 1 hour   How long can you walk comfortably? 1 hour   Diagnostic tests x-ray: negative   Patient Stated Goals reduce pain   Currently in Pain? Yes   Pain Score 3  Pain Location Neck   Pain Orientation Left   Pain Descriptors / Indicators Aching;Tightness   Pain Type Acute pain   Pain Onset 1 to 4 weeks ago   Pain Frequency Constant   Aggravating Factors  looking down repetitively   Pain Relieving Factors stretching, heat   Multiple Pain Sites Yes   Pain Score 5   Pain Location Back   Pain Orientation Left   Pain Descriptors / Indicators Aching;Dull   Pain Type Acute pain   Pain Onset 1 to 4 weeks ago   Pain Frequency Constant   Aggravating Factors  standing/walking too long, being still   Pain Relieving Factors yoga, stretching                         [facility] Adult PT Treatment/Exercise - 02/01/17 0001      Neck Exercises: Supine   Other Supine Exercise lay on foam roll with bil. shoulder  abduction 20x, alternate shoulder flexion 20x; diagonals 15x both ways, marching with abdominal bracing   Other Supine Exercise decompression of back and neck while laying on foam roll for 2 min, followed by chin tucks, cervical rotation     Lumbar Exercises: Stretches   Quadruped Mid Back Stretch 30 seconds;2 reps   Piriformis Stretch 1 rep;30 seconds   Piriformis Stretch Limitations both sides; supine     Lumbar Exercises: Sidelying   Clam 10 reps   Clam Limitations bil. sides; abdominal bracing     Lumbar Exercises: Quadruped   Madcat/Old Horse 15 reps   Opposite Arm/Leg Raise 20 reps  verbal and tactile cues for keeping spinal nuetral     Modalities   Modalities Moist Heat     Moist Heat Therapy   Number Minutes Moist Heat 10 Minutes   Moist Heat Location Lumbar Spine  prone     Manual Therapy   Manual Therapy Soft tissue mobilization   Soft tissue mobilization lumbar paraspinals          Trigger Point Dry Needling - 02/01/17 1123    Consent Given? Yes   Education Handout Provided Yes   Muscles Treated Upper Body Quadratus Lumborum  lumbar multifidi   Muscles Treated Lower Body --  twitch noted and muscle relaxed after the dry needling                PT Short Term Goals - 01/28/17 1250      PT SHORT TERM GOAL #1   Title independent with initial HEP   Time 4   Period Weeks   Status Achieved     PT SHORT TERM GOAL #2   Title demonstrate correct body mechanics and neutral posture and report modifications with home tasks   Time 4   Period Weeks   Status On-going     PT SHORT TERM GOAL #3   Title report a 30% reduction in neck pain with computer and phone use   Time 4   Period Weeks   Status On-going  25% better     PT SHORT TERM GOAL #4   Title report 30% reduction in LBP with standing and walking   Time 4   Period Weeks   Status On-going  25% better           PT Long Term Goals - 01/10/17 1011      PT LONG TERM GOAL #1   Title be  independent in advanced HEP   Time 8  PT LONG TERM GOAL #2   Title reduce FOTO to < or = to 39% limitation   Time 8   Period Weeks   Status New     PT LONG TERM GOAL #3   Title report a 60% reduction in neck pain with computer and phone use   Time 8   Period Weeks   Status New     PT LONG TERM GOAL #4   Title report a 60% reduction in LBP with standing and walking   Time 8   Status New     PT LONG TERM GOAL #5   Title **               Plan - 02/01/17 1145    Clinical Impression Statement Patient had increased mobility of lumbar paraspinals and patient felt the muscles were more relaxed. Patient did well with her exercises. Patient reports she has less pain with therapy.  Patient is consistent with her HEP. Patient will benefit from skilled therapy to reduce pain and muscle spasms so she is able to do daily tasks with less difficulty.    Rehab Potential Good   PT Frequency 2x / week   PT Duration 8 weeks   PT Treatment/Interventions ADLs/Self Care Home Management;Cryotherapy;Electrical Stimulation;Functional mobility training;Moist Heat;Therapeutic activities;Therapeutic exercise;Neuromuscular re-education;Patient/family education;Manual techniques;Dry needling;Taping   PT Next Visit Plan assess dry needling to lumbar and see if she needs it for left upper trap and cervical multifidi; cervical and upper thoracic joint mobilization; foam roll exercises   Consulted and Agree with Plan of Care Patient      Patient will benefit from skilled therapeutic intervention in order to improve the following deficits and impairments:  Postural dysfunction, Impaired flexibility, Pain, Decreased activity tolerance, Increased muscle spasms  Visit Diagnosis: Cervicalgia  Chronic left-sided low back pain without sciatica  Cramp and spasm     Problem List Patient Active Problem List   Diagnosis Date Noted  . Fibromyalgia 02/28/2016  . Chronic low back pain 01/24/2016  .  Lupus 04/19/2015  . GERD (gastroesophageal reflux disease) 04/19/2015  . Cervicogenic headache 02/10/2015  . Neck pain 02/10/2015  . Hot flashes 10/26/2014  . GAD (generalized anxiety disorder) 10/26/2014  . Peripheral neuropathy due to chemotherapy ([place]) 03/12/2014  . Breast cancer of lower-outer quadrant of right female breast ([place]) 09/15/2013  . Left shoulder pain - seeing Dr. Lorin Mercy 08/20/2013  . Hypertension     Earlie Counts, PT 02/01/17 11:48 AM   [facility] [address] [place], [place], [address] Phone: [phone]   Fax:  [phone]  Name: Drew Herman MRN: [id number] Date of Birth: May 13, 1961

## 2017-02-01 NOTE — Patient Instructions (Signed)

## 2017-02-08 ENCOUNTER — Encounter: Payer: BLUE CROSS/BLUE SHIELD | Admitting: Physical Therapy

## 2017-02-11 ENCOUNTER — Telehealth: Payer: Self-pay | Admitting: Family Medicine

## 2017-02-11 NOTE — Telephone Encounter (Signed)
° °  tiZANidine (ZANAFLEX) 2 MG tablet   Can she get this increased or a different medication for muscle spasms?  Patient would also like to get a higher dosage in venlafaxine XR (EFFEXOR-XR) 75 MG 24 hr capsule  Because she is stil having hot flashes.   Send to: [place], [place] - [address] [phone] (Phone) [phone] (Fax)     Please advise

## 2017-02-12 ENCOUNTER — Ambulatory Visit: Payer: BLUE CROSS/BLUE SHIELD

## 2017-02-12 DIAGNOSIS — R252 Cramp and spasm: Secondary | ICD-10-CM

## 2017-02-12 DIAGNOSIS — M545 Low back pain, unspecified: Secondary | ICD-10-CM

## 2017-02-12 DIAGNOSIS — M542 Cervicalgia: Secondary | ICD-10-CM

## 2017-02-12 DIAGNOSIS — G8929 Other chronic pain: Secondary | ICD-10-CM | POA: Diagnosis not present

## 2017-02-12 NOTE — Telephone Encounter (Signed)
I called the pt and informed her of the message below and an appt was scheduled for 7/19 at 3:15pm.

## 2017-02-12 NOTE — Telephone Encounter (Signed)
appt advised. Thanks.

## 2017-02-12 NOTE — Therapy (Signed)
[facility] [address] [place], [place], [address] Phone: [phone]   Fax:  [phone]  Physical Therapy Treatment  Patient Details  Name: Tina [name] MRN: [id number] Date of Birth: 31-May-1961 Referring Provider: Colin Benton, MD  Encounter Date: 02/12/2017      PT End of Session - 02/12/17 1231    Visit Number 5   Date for PT Re-Evaluation 03/07/17   Authorization Type BCBS 30 visit limit   PT Start Time 1147   PT Stop Time 1241   PT Time Calculation (min) 54 min   Activity Tolerance Patient tolerated treatment well   Behavior During Therapy [facility] for tasks assessed/performed      Past Medical History:  Diagnosis Date  . Anxiety   . Breast cancer ([place]) 2015   lumpectomy  . Cancer of right breast ([place]) 09/03/13   Invasive Ductal Carcinoma/Ductal Carcinoma Insitu  biopsies  . Chronic low back pain 01/24/2016   -and R hip pain -seen by rheumatologist and ortho in the past -MRI with Dr. Lorin Mercy in 2015, R hip plain films in 2015 as well acupuncture, injections, tramadol not helpful trigger therapy at [facility] and PT helpful   . Degenerative joint disease    back neck and shoulder  . Fibromyalgia    Dr. Estanislado Pandy  . GERD (gastroesophageal reflux disease)    hx esophagitis  . Hot flashes   . Hypertension   . Leukopenia 06/16/2013  . Lupus    rheumatologist - Dr. Estanislado Pandy  . Migraines   . Neuromuscular disorder ([place])    neuropathy from chemo in feet/hands  . Personal history of chemotherapy    2015  . Personal history of radiation therapy 2015  . S/P radiation therapy 04/27/2014-06/07/2014   1) Right breast, 50 Gy in 25 fractions/ 2) Right breast boost, 10 Gy in 5 fractions    Past Surgical History:  Procedure Laterality Date  . BREAST BIOPSY Left 2015   benign core  . BREAST BIOPSY Right 2015   malignant core   . BREAST SURGERY  03/01/2014   hx right breast cancer- 3 lymph nodes removed  and lumpectomy  . [place]   rt  . CESAREAN SECTION     x 3  . PORTACATH PLACEMENT  08/2013   still currently active   . ROBOTIC ASSISTED BILATERAL SALPINGO OOPHERECTOMY Bilateral 07/09/2014   Procedure: ROBOTIC ASSISTED BILATERAL SALPINGO OOPHORECTOM; :LYSIS OF ADHESIONS;  Surgeon: Marvene Staff, MD;  Location: [facility];  Service: Gynecology;  Laterality: Bilateral;  . TRIGGER FINGER RELEASE  2004   rt thumb  . trigger finger release left thumb  06/28/14   at orthopedic surgical center  . TUBAL LIGATION    . WISDOM TOOTH EXTRACTION      There were no vitals filed for this visit.      Subjective Assessment - 02/12/17 1152    Subjective I am having low back back spasms today.  Pt   Pertinent History history of breast cancer, Lt carpal tunnel syndrome, fibromyalgia   Currently in Pain? Yes   Pain Score 3    Pain Location Neck   Pain Orientation Left   Pain Descriptors / Indicators Aching;Tightness   Pain Type Acute pain   Pain Onset 1 to 4 weeks ago   Pain Frequency Constant   Aggravating Factors  looking down   Pain Relieving Factors stretching, heat   Pain Score 8   Pain Location Back  Pain Orientation Left   Pain Descriptors / Indicators Spasm   Pain Type Acute pain   Pain Onset 1 to 4 weeks ago   Pain Frequency Constant   Aggravating Factors  standing/walking too long, being still   Pain Relieving Factors yoga, stretching                         [facility] Adult PT Treatment/Exercise - 02/12/17 0001      Exercises   Exercises Knee/Hip     Lumbar Exercises: Stretches   Single Knee to Chest Stretch 3 reps;30 seconds   Lower Trunk Rotation 3 reps;20 seconds   Piriformis Stretch 3 reps;20 seconds  figure 4 and diagonal knee to chest     Knee/Hip Exercises: Aerobic   Nustep Level 2 x 8 minutes  PT present to discuss progress     Moist Heat Therapy   Number Minutes Moist Heat 12 Minutes   Moist Heat Location Lumbar Spine   prone     Manual Therapy   Manual Therapy Soft tissue mobilization;Myofascial release   Soft tissue mobilization lumbar paraspinals and proximal gluteals          Trigger Point Dry Needling - 02/12/17 1203    Consent Given? Yes   Muscles Treated Upper Body --  bil lumbar multifidi   Muscles Treated Lower Body Gluteus minimus;Gluteus maximus   Gluteus Maximus Response Twitch response elicited;Palpable increased muscle length   Gluteus Minimus Response Twitch response elicited;Palpable increased muscle length                PT Short Term Goals - 02/12/17 1151      PT SHORT TERM GOAL #1   Title independent with initial HEP   Status Achieved     PT SHORT TERM GOAL #2   Title demonstrate correct body mechanics and neutral posture and report modifications with home tasks   Status Achieved     PT SHORT TERM GOAL #3   Title report a 30% reduction in neck pain with computer and phone use   Time 4   Period Weeks   Status On-going           PT Long Term Goals - 01/10/17 1011      PT LONG TERM GOAL #1   Title be independent in advanced HEP   Time 8     PT LONG TERM GOAL #2   Title reduce FOTO to < or = to 39% limitation   Time 8   Period Weeks   Status New     PT LONG TERM GOAL #3   Title report a 60% reduction in neck pain with computer and phone use   Time 8   Period Weeks   Status New     PT LONG TERM GOAL #4   Title report a 60% reduction in LBP with standing and walking   Time 8   Status New     PT LONG TERM GOAL #5   Title **               Plan - 02/12/17 1154    Clinical Impression Statement Pt reports lumbar spasms into the hips over the past few days without cause.  PT focused on flexibility and dry needling to the lumbar spine and hips.  Pt with improved mobility and reduced pain at the end of session.  Pt will continue to benefit from skilled PT for core strength, hip and  lumbar flexibility and manual/modalities as needed.      Rehab Potential Good   PT Frequency 2x / week   PT Duration 8 weeks   PT Treatment/Interventions ADLs/Self Care Home Management;Cryotherapy;Electrical Stimulation;Functional mobility training;Moist Heat;Therapeutic activities;Therapeutic exercise;Neuromuscular re-education;Patient/family education;Manual techniques;Dry needling;Taping   PT Next Visit Plan assess dry needling to lumbar, ; cervical and upper thoracic joint mobilization; foam roll exercises if spasms have reduced.   Consulted and Agree with Plan of Care Patient      Patient will benefit from skilled therapeutic intervention in order to improve the following deficits and impairments:  Postural dysfunction, Impaired flexibility, Pain, Decreased activity tolerance, Increased muscle spasms  Visit Diagnosis: Cervicalgia  Chronic left-sided low back pain without sciatica  Cramp and spasm     Problem List Patient Active Problem List   Diagnosis Date Noted  . Fibromyalgia 02/28/2016  . Chronic low back pain 01/24/2016  . Lupus 04/19/2015  . GERD (gastroesophageal reflux disease) 04/19/2015  . Cervicogenic headache 02/10/2015  . Neck pain 02/10/2015  . Hot flashes 10/26/2014  . GAD (generalized anxiety disorder) 10/26/2014  . Peripheral neuropathy due to chemotherapy ([place]) 03/12/2014  . Breast cancer of lower-outer quadrant of right female breast ([place]) 09/15/2013  . Left shoulder pain - seeing Dr. Lorin Mercy 08/20/2013  . Hypertension     Sigurd Sos, PT 02/12/17 12:36 PM  [facility] [address] [place], [place], [address] Phone: [phone]   Fax:  [phone]  Name: Elisheva Fallas MRN: [id number] Date of Birth: [birth year]

## 2017-02-14 ENCOUNTER — Ambulatory Visit: Payer: BLUE CROSS/BLUE SHIELD | Admitting: Family Medicine

## 2017-02-15 ENCOUNTER — Encounter: Payer: Self-pay | Admitting: Physical Therapy

## 2017-02-15 ENCOUNTER — Ambulatory Visit: Payer: BLUE CROSS/BLUE SHIELD | Admitting: Physical Therapy

## 2017-02-15 DIAGNOSIS — M542 Cervicalgia: Secondary | ICD-10-CM

## 2017-02-15 DIAGNOSIS — M545 Low back pain, unspecified: Secondary | ICD-10-CM

## 2017-02-15 DIAGNOSIS — G8929 Other chronic pain: Secondary | ICD-10-CM

## 2017-02-15 DIAGNOSIS — R252 Cramp and spasm: Secondary | ICD-10-CM

## 2017-02-15 NOTE — Patient Instructions (Signed)
   Start by sitting on a foam roll and cross your affected leg on top of your other knee as shown. Lean slightly towards your affected side.   Next, using your arms and unaffected leg, roll forward and back across your buttock area.   Start by sitting on a foam roll under both your thighs.    Next, using your arms, roll forward and back across this area.    Start on your side with a foam roll under your bottom thigh.    Next, using your arms and unaffected leg, roll up and down the foam roll along your lateral thigh.     Start by lying face down so that a foam roll is under the top of your thighs.   Next, using your arms propped on your elbows, roll forward and back across this area.  [facility] [address] [place], [place] [address] Phone # [phone] Fax [phone]

## 2017-02-15 NOTE — Therapy (Signed)
[facility] [address] [place], [place], [address] Phone: [phone]   Fax:  [phone]  Physical Therapy Treatment  Patient Details  Name: Tina [name] MRN: [id number] Date of Birth: 04/19/1961 Referring Provider: Colin Benton, MD  Encounter Date: 02/15/2017      PT End of Session - 02/15/17 1143    Visit Number 6   Date for PT Re-Evaluation 03/07/17   Authorization Type [employer] 30 visit limit   Authorization - Visit Number 6   Authorization - Number of Visits 30   PT Start Time 1104   PT Stop Time 1142   PT Time Calculation (min) 38 min   Activity Tolerance Patient tolerated treatment well   Behavior During Therapy [facility] for tasks assessed/performed      Past Medical History:  Diagnosis Date  . Anxiety   . Breast cancer ([place]) 2015   lumpectomy  . Cancer of right breast ([place]) 09/03/13   Invasive Ductal Carcinoma/Ductal Carcinoma Insitu  biopsies  . Chronic low back pain 01/24/2016   -and R hip pain -seen by rheumatologist and ortho in the past -MRI with Dr. Lorin Mercy in 2015, R hip plain films in 2015 as well acupuncture, injections, tramadol not helpful trigger therapy at [facility] and PT helpful   . Degenerative joint disease    back neck and shoulder  . Fibromyalgia    Dr. Estanislado Pandy  . GERD (gastroesophageal reflux disease)    hx esophagitis  . Hot flashes   . Hypertension   . Leukopenia 06/16/2013  . Lupus    rheumatologist - Dr. Estanislado Pandy  . Migraines   . Neuromuscular disorder ([place])    neuropathy from chemo in feet/hands  . Personal history of chemotherapy    2015  . Personal history of radiation therapy 2015  . S/P radiation therapy 04/27/2014-06/07/2014   1) Right breast, 50 Gy in 25 fractions/ 2) Right breast boost, 10 Gy in 5 fractions    Past Surgical History:  Procedure Laterality Date  . BREAST BIOPSY Left 2015   benign core  . BREAST BIOPSY Right 2015   malignant core   .  BREAST SURGERY  03/01/2014   hx right breast cancer- 3 lymph nodes removed and lumpectomy  . [place]   rt  . CESAREAN SECTION     x 3  . PORTACATH PLACEMENT  08/2013   still currently active   . ROBOTIC ASSISTED BILATERAL SALPINGO OOPHERECTOMY Bilateral 07/09/2014   Procedure: ROBOTIC ASSISTED BILATERAL SALPINGO OOPHORECTOM; :LYSIS OF ADHESIONS;  Surgeon: Marvene Staff, MD;  Location: [facility];  Service: Gynecology;  Laterality: Bilateral;  . TRIGGER FINGER RELEASE  2004   rt thumb  . trigger finger release left thumb  06/28/14   at orthopedic surgical center  . TUBAL LIGATION    . WISDOM TOOTH EXTRACTION      There were no vitals filed for this visit.      Subjective Assessment - 02/15/17 1105    Subjective My back hurts.  I feel better from last visit due to moving better, squat without hesitating. I do not want the dry needling to do.    Pertinent History history of breast cancer, Lt carpal tunnel syndrome, fibromyalgia   Limitations Sitting;Walking   How long can you sit comfortably? 1 hour   How long can you walk comfortably? 1 hour   Diagnostic tests x-ray: negative   Patient Stated Goals reduce pain   Currently in  Pain? Yes   Pain Score 3    Pain Location Neck   Pain Orientation Left   Pain Descriptors / Indicators Aching;Tightness   Pain Type Acute pain   Pain Frequency Constant   Aggravating Factors  looking down   Pain Relieving Factors stretching, heat   Multiple Pain Sites Yes   Pain Score 9   Pain Location Back   Pain Orientation Left   Pain Descriptors / Indicators Throbbing   Pain Type Acute pain   Pain Onset 1 to 4 weeks ago   Pain Frequency Constant   Aggravating Factors  standing and walking too long, being still   Pain Relieving Factors yoga stretching                         [facility] Adult PT Treatment/Exercise - 02/15/17 0001      Neck Exercises: Supine   Other Supine Exercise lay on foam roll with bil.  shoulder abduction 20x, alternate shoulder flexion 20x; diagonals 15x both ways, marching with abdominal bracing   Other Supine Exercise decompression of back and neck while laying on foam roll for 2 min, followed by chin tucks, cervical rotation     Manual Therapy   Manual Therapy Soft tissue mobilization;Myofascial release   Soft tissue mobilization lumbar paraspinals and proximal gluteals   Myofascial Release tissue rolling to lumbar and lower thoracic area                PT Education - 02/15/17 1143    Education provided Yes   Education Details foam roller exercises   Person(s) Educated Patient   Methods Explanation;Demonstration;Verbal cues;Handout   Comprehension Verbalized understanding;Returned demonstration          PT Short Term Goals - 02/12/17 1151      PT SHORT TERM GOAL #1   Title independent with initial HEP   Status Achieved     PT SHORT TERM GOAL #2   Title demonstrate correct body mechanics and neutral posture and report modifications with home tasks   Status Achieved     PT SHORT TERM GOAL #3   Title report a 30% reduction in neck pain with computer and phone use   Time 4   Period Weeks   Status On-going           PT Long Term Goals - 01/10/17 1011      PT LONG TERM GOAL #1   Title be independent in advanced HEP   Time 8     PT LONG TERM GOAL #2   Title reduce FOTO to < or = to 39% limitation   Time 8   Period Weeks   Status New     PT LONG TERM GOAL #3   Title report a 60% reduction in neck pain with computer and phone use   Time 8   Period Weeks   Status New     PT LONG TERM GOAL #4   Title report a 60% reduction in LBP with standing and walking   Time 8   Status New     PT LONG TERM GOAL #5   Title **               Plan - 02/15/17 1144    Clinical Impression Statement Patient did not want the dry needling today.  Patient learned foam roller techniques to massage her muscles. Patient felt better after therapy  and had increased tissue mobility.  Patient  will cotinue to benefit from skilled PT for core strength, hip and lumbar flexibility and manual/modalities as needed.    Rehab Potential Good   PT Frequency 2x / week   PT Duration 8 weeks   PT Treatment/Interventions ADLs/Self Care Home Management;Cryotherapy;Electrical Stimulation;Functional mobility training;Moist Heat;Therapeutic activities;Therapeutic exercise;Neuromuscular re-education;Patient/family education;Manual techniques;Dry needling;Taping   PT Next Visit Plan ; cervical, lumbar and upper thoracic joint mobilization; foam roll exercises if spasms have reduced.   PT Home Exercise Plan progress as needed   Consulted and Agree with Plan of Care Patient      Patient will benefit from skilled therapeutic intervention in order to improve the following deficits and impairments:  Postural dysfunction, Impaired flexibility, Pain, Decreased activity tolerance, Increased muscle spasms  Visit Diagnosis: Cervicalgia  Chronic left-sided low back pain without sciatica  Cramp and spasm     Problem List Patient Active Problem List   Diagnosis Date Noted  . Fibromyalgia 02/28/2016  . Chronic low back pain 01/24/2016  . Lupus 04/19/2015  . GERD (gastroesophageal reflux disease) 04/19/2015  . Cervicogenic headache 02/10/2015  . Neck pain 02/10/2015  . Hot flashes 10/26/2014  . GAD (generalized anxiety disorder) 10/26/2014  . Peripheral neuropathy due to chemotherapy ([place]) 03/12/2014  . Breast cancer of lower-outer quadrant of right female breast ([place]) 09/15/2013  . Left shoulder pain - seeing Dr. Lorin Mercy 08/20/2013  . Hypertension     Earlie Counts, PT 02/15/17 11:48 AM   [facility] [address], [place], [place], [address] Phone: [phone]   Fax:  [phone]  Name: Tina [name] MRN: [id number] Date of Birth: [birth year]

## 2017-02-26 ENCOUNTER — Ambulatory Visit: Payer: BLUE CROSS/BLUE SHIELD | Admitting: Physical Therapy

## 2017-02-26 DIAGNOSIS — R252 Cramp and spasm: Secondary | ICD-10-CM

## 2017-02-26 DIAGNOSIS — M545 Low back pain: Secondary | ICD-10-CM | POA: Diagnosis not present

## 2017-02-26 DIAGNOSIS — G8929 Other chronic pain: Secondary | ICD-10-CM | POA: Diagnosis not present

## 2017-02-26 DIAGNOSIS — M542 Cervicalgia: Secondary | ICD-10-CM | POA: Diagnosis not present

## 2017-02-26 NOTE — Therapy (Signed)
[facility] [address], [place], [place], [address] Phone: [phone]   Fax:  [phone]  Physical Therapy Treatment  Patient Details  Name: Tina [name] MRN: [id number] Date of Birth: June 23, 1961 Referring Provider: Colin Benton, MD  Encounter Date: 02/26/2017      PT End of Session - 02/26/17 1531    Visit Number 7   Date for PT Re-Evaluation 03/07/17   Authorization Type [employer] 30 visit limit   Authorization - Visit Number 7   Authorization - Number of Visits 30   PT Start Time 3704   PT Stop Time 8889   PT Time Calculation (min) 41 min   Activity Tolerance Patient tolerated treatment well      Past Medical History:  Diagnosis Date  . Anxiety   . Breast cancer ([place]) 2015   lumpectomy  . Cancer of right breast ([place]) 09/03/13   Invasive Ductal Carcinoma/Ductal Carcinoma Insitu  biopsies  . Chronic low back pain 01/24/2016   -and R hip pain -seen by rheumatologist and ortho in the past -MRI with Dr. Lorin Mercy in 2015, R hip plain films in 2015 as well acupuncture, injections, tramadol not helpful trigger therapy at [facility] and PT helpful   . Degenerative joint disease    back neck and shoulder  . Fibromyalgia    Dr. Estanislado Pandy  . GERD (gastroesophageal reflux disease)    hx esophagitis  . Hot flashes   . Hypertension   . Leukopenia 06/16/2013  . Lupus    rheumatologist - Dr. Estanislado Pandy  . Migraines   . Neuromuscular disorder ([place])    neuropathy from chemo in feet/hands  . Personal history of chemotherapy    2015  . Personal history of radiation therapy 2015  . S/P radiation therapy 04/27/2014-06/07/2014   1) Right breast, 50 Gy in 25 fractions/ 2) Right breast boost, 10 Gy in 5 fractions    Past Surgical History:  Procedure Laterality Date  . BREAST BIOPSY Left 2015   benign core  . BREAST BIOPSY Right 2015   malignant core   . BREAST SURGERY  03/01/2014   hx right breast cancer- 3 lymph  nodes removed and lumpectomy  . [place]   rt  . CESAREAN SECTION     x 3  . PORTACATH PLACEMENT  08/2013   still currently active   . ROBOTIC ASSISTED BILATERAL SALPINGO OOPHERECTOMY Bilateral 07/09/2014   Procedure: ROBOTIC ASSISTED BILATERAL SALPINGO OOPHORECTOM; :LYSIS OF ADHESIONS;  Surgeon: Marvene Staff, MD;  Location: [facility];  Service: Gynecology;  Laterality: Bilateral;  . TRIGGER FINGER RELEASE  2004   rt thumb  . trigger finger release left thumb  06/28/14   at orthopedic surgical center  . TUBAL LIGATION    . WISDOM TOOTH EXTRACTION      There were no vitals filed for this visit.      Subjective Assessment - 02/26/17 1459    Subjective My back feels better but my neck is more sore today.   Pertinent History history of breast cancer, Lt carpal tunnel syndrome, fibromyalgia   Diagnostic tests x-ray: negative   Patient Stated Goals reduce pain   Currently in Pain? Yes   Pain Score 5    Pain Location Neck   Pain Orientation Left   Pain Descriptors / Indicators Sore   Pain Type Acute pain   Pain Onset 1 to 4 weeks ago   Aggravating Factors  looking left  Pain Relieving Factors stretches   Multiple Pain Sites Yes   Pain Score 5   Pain Location Back   Pain Orientation Left;Right;Lower   Pain Descriptors / Indicators Throbbing   Pain Type Acute pain   Pain Radiating Towards to right buttock   Pain Onset 1 to 4 weeks ago   Pain Frequency Constant   Aggravating Factors  standing and walking, being still gets stiff   Pain Relieving Factors stretching                         [facility] Adult PT Treatment/Exercise - 02/26/17 0001      Neck Exercises: Supine   Other Supine Exercise lay on foam roll with bil. shoulder abduction 20x, flexion 20x; hip add/abd 20x, marching, and alternating UE/LE with abdominal bracing   Other Supine Exercise decompression of back and neck while laying on foam roll for 2 min, followed by chin tucks,  cervical rotation     Manual Therapy   Soft tissue mobilization cervical paraspinals and UT   Myofascial Release cervical paraspinals, suboccipitals                  PT Short Term Goals - 02/26/17 1532      PT SHORT TERM GOAL #3   Title report a 30% reduction in neck pain with computer and phone use   Time 4   Period Weeks   Status On-going     PT SHORT TERM GOAL #4   Title report 30% reduction in LBP with standing and walking   Time 4   Period Weeks   Status On-going           PT Long Term Goals - 01/10/17 1011      PT LONG TERM GOAL #1   Title be independent in advanced HEP   Time 8     PT LONG TERM GOAL #2   Title reduce FOTO to < or = to 39% limitation   Time 8   Period Weeks   Status New     PT LONG TERM GOAL #3   Title report a 60% reduction in neck pain with computer and phone use   Time 8   Period Weeks   Status New     PT LONG TERM GOAL #4   Title report a 60% reduction in LBP with standing and walking   Time 8   Status New     PT LONG TERM GOAL #5   Title **               Plan - 02/26/17 1533    Clinical Impression Statement Pt able to perform foam roll exercise and progressed difficulty without pain.  Pt had release of some trigger point on left cervical and upper trap muscles.  Continues to benefit from skilled PT for improved core strength and stability and increased flexibility.   PT Treatment/Interventions ADLs/Self Care Home Management;Cryotherapy;Electrical Stimulation;Functional mobility training;Moist Heat;Therapeutic activities;Therapeutic exercise;Neuromuscular re-education;Patient/family education;Manual techniques;Dry needling;Taping   PT Next Visit Plan ; cervical, lumbar and upper thoracic joint mobilization; foam roll exercises and progress cervical and core stability   Consulted and Agree with Plan of Care Patient      Patient will benefit from skilled therapeutic intervention in order to improve the following  deficits and impairments:  Postural dysfunction, Impaired flexibility, Pain, Decreased activity tolerance, Increased muscle spasms  Visit Diagnosis: Cervicalgia  Chronic left-sided low back pain without sciatica  Cramp and spasm  Neck pain     Problem List Patient Active Problem List   Diagnosis Date Noted  . Fibromyalgia 02/28/2016  . Chronic low back pain 01/24/2016  . Lupus 04/19/2015  . GERD (gastroesophageal reflux disease) 04/19/2015  . Cervicogenic headache 02/10/2015  . Neck pain 02/10/2015  . Hot flashes 10/26/2014  . GAD (generalized anxiety disorder) 10/26/2014  . Peripheral neuropathy due to chemotherapy ([place]) 03/12/2014  . Breast cancer of lower-outer quadrant of right female breast ([place]) 09/15/2013  . Left shoulder pain - seeing Dr. Lorin Mercy 08/20/2013  . Hypertension     Zannie Cove, PT 02/26/2017, 3:35 PM  [facility] [address], [place], [place], [address] Phone: [phone]   Fax:  [phone]  Name: Tina [name] MRN: [id number] Date of Birth: 07/24/1961

## 2017-03-05 ENCOUNTER — Ambulatory Visit: Payer: BLUE CROSS/BLUE SHIELD | Attending: Family Medicine | Admitting: Physical Therapy

## 2017-03-05 ENCOUNTER — Encounter: Payer: Self-pay | Admitting: Physical Therapy

## 2017-03-05 ENCOUNTER — Telehealth: Payer: Self-pay | Admitting: Family Medicine

## 2017-03-05 DIAGNOSIS — M545 Low back pain, unspecified: Secondary | ICD-10-CM

## 2017-03-05 DIAGNOSIS — G8929 Other chronic pain: Secondary | ICD-10-CM | POA: Insufficient documentation

## 2017-03-05 DIAGNOSIS — M542 Cervicalgia: Secondary | ICD-10-CM | POA: Insufficient documentation

## 2017-03-05 DIAGNOSIS — R252 Cramp and spasm: Secondary | ICD-10-CM | POA: Diagnosis not present

## 2017-03-05 MED ORDER — LISINOPRIL-HYDROCHLOROTHIAZIDE 20-12.5 MG PO TABS: 1.0000 | ORAL_TABLET | Freq: Every day | ORAL | 0 refills | Status: DC

## 2017-03-05 NOTE — Telephone Encounter (Signed)
Rx done. 

## 2017-03-05 NOTE — Telephone Encounter (Signed)
Pt request refill  lisinopril-hydrochlorothiazide (PRINZIDE,ZESTORETIC) 20-12.5 MG tablet  Pt is completely out and needs asap. Pt has scheduled a cpe 8/31   [place], [place] - [address]
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
## 2017-03-13 ENCOUNTER — Other Ambulatory Visit: Payer: Self-pay | Admitting: Family Medicine

## 2017-03-26 NOTE — Progress Notes (Signed)
HPI:  Here for CPE: Reports is doing well for the most part. She does still have some hot flashes, though the Effexor helped a great deal. She wants to try a higher dose of this. She denies any anxiety or depression. She is getting regular exercise and eats a healthy diet. She is up-to-date on her mammograms, Pap smears, colon cancer screening. She declines vaccines today.  -Concerns and/or follow up today:   HTN: -meds:lisinopril-hctz 20-12.5, asa 81 mg  -denies: CP, SOB, DOE  GERD/chronic constipation: -meds: omeprazole  -did have esophagitis in the past -take mirilax and colace for the constipation  Chronic low back and hip pain: -ok currently -hx DDD and lupus (sees rheumatologist) -has seen ortho, acupuncturist, Dr. Lorin Mercy and Dr. Leonides Schanz -has done PT  -intermittent flares of pain in R low back, sometimes radiating to R buttock and hip  Cervicogenic headaches and facial pain/Migraines: -seeing neurology, rheum and ortho for this -MRI head and neck with some DDD -meds: venlafaxine, neuronitin, prn triptan, tramadol muscle relaxer  ? Fibromyalgia and Lupus: -seeing Dr. Estanislado Pandy  -meds:  neurontin, tizanidine, venlafaxine  Anxiety/hot flashes: -neurology started venlafaxine for this and her other issues  Hx breast ca: -R breast -sees Oncologist, Dr. Loleta Dicker -s/p lumpectomy, chemo, radiation in 2015, on aromasin in the past - declined further  -Taking folic acid, vitamin D or calcium:take vit d  -Diabetes and Dyslipidemia Screening: fasting for labs  -Hx of HTN: no  -Vaccines: declined today  -pap history:utd  -sexual activity: yes, female partner, no new partners  -wants STI testing (Hep C if born [birth year]): no  -FH breast, colon or ovarian ca: see FH Last mammogram:utd Last colon cancer screening: utd  -Alcohol, Tobacco, drug use: see social history  Review of Systems - no fevers, unintentional weight loss, vision loss, hearing loss, chest pain, sob,  hemoptysis, melena, hematochezia, hematuria, genital discharge, changing or concerning skin lesions, bleeding, bruising, loc, thoughts of self harm or SI  Past Medical History:  Diagnosis Date  . Anxiety   . Breast cancer ([place]) 2015   lumpectomy  . Cancer of right breast ([place]) 09/03/13   Invasive Ductal Carcinoma/Ductal Carcinoma Ins[facility]  biopsies  . Chronic low back pain 01/24/2016   -and R hip pain -seen by rheumatologist and ortho in the past -MRI with Dr. Lorin Mercy in 2015, R hip plain films in 2015 as well acupuncture, injections, tramadol not helpful [facility] and PT helpful   . Degenerative joint disease    back neck and shoulder  . Fibromyalgia    Dr. Estanislado Pandy  . GERD (gastroesophageal reflux disease)    hx esophagitis  . Hot flashes   . Hypertension   . Leukopenia 06/16/2013  . Lupus    rheumatologist - Dr. Estanislado Pandy  . Migraines   . Neuromuscular disorder ([place])    neuropathy from chemo in feet/hands  . Personal history of chemotherapy    2015  . Personal history of radiation therapy 2015  . S/P radiation therapy 04/27/2014-06/07/2014   1) Right breast, 50 Gy in 25 fractions/ 2) Right breast boost, 10 Gy in 5 fractions    Past Surgical History:  Procedure Laterality Date  . BREAST BIOPSY Left 2015   benign core  . BREAST BIOPSY Right 2015   malignant core   . BREAST SURGERY  03/01/2014   hx right breast cancer- 3 lymph nodes removed and lumpectomy  . [place]   rt  . CESAREAN SECTION  x 3  . PORTACATH PLACEMENT  08/2013   still currently active   . ROBOTIC ASSISTED BILATERAL SALPINGO OOPHERECTOMY Bilateral 07/09/2014   Procedure: ROBOTIC ASSISTED BILATERAL SALPINGO OOPHORECTOM; :LYSIS OF ADHESIONS;  Surgeon: Marvene Staff, MD;  Location: [facility];  Service: Gynecology;  Laterality: Bilateral;  . TRIGGER FINGER RELEASE  2004   rt thumb  . trigger finger release left thumb  06/28/14   at [facility]    . TUBAL LIGATION    . WISDOM TOOTH EXTRACTION      Family History  Problem Relation Age of Onset  . Hypertension Mother   . Dementia Mother        small vessel disease  . Cancer Mother 30       cervical  cancer  . Cancer Father 28       prostate ca  . Diabetes Father   . Arthritis Father     Social History   Social History  . Marital status: Married    Spouse name: N/A  . Number of children: 1  . Years of education: N/A   Occupational History  .      homemaker   Social History Main Topics  . Smoking status: Former Smoker    Packs/day: 1.00    Years: 15.00    Types: Cigars, Cigarettes    Quit date: 02/23/2004  . Smokeless tobacco: Never Used     Comment: remote smoking history  . Alcohol use 0.0 oz/week     Comment: occ   . Drug use: Yes     Comment: [place]  . Sexual activity: Yes   Other Topics Concern  . 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      Home Situation: lives with husband and 68 yo son      Spiritual Beliefs: Christian      Lifestyle: elliptical 3-4 times per week; working on diet as well over last year in 2014              Current Outpatient Prescriptions:  .  aspirin 81 MG tablet, Take 81 mg by mouth daily., Disp: , Rfl:  .  b complex vitamins capsule, Take 1 capsule by mouth daily., Disp: , Rfl:  .  BIOTIN 5000 PO, Take 1 capsule by mouth daily. , Disp: , Rfl:  .  Calcium Carbonate-Vit D-Min (CALTRATE 600+D PLUS MINERALS PO), Take 1 tablet by mouth daily., Disp: , Rfl:  .  Docusate Calcium (STOOL SOFTENER PO), Take by mouth as needed., Disp: , Rfl:  .  gabapentin (NEURONTIN) 300 MG capsule, Take 1 capsule (300 mg total) by mouth 3 (three) times daily., Disp: 270 capsule, Rfl: 3 .  ibuprofen (ADVIL,MOTRIN) 800 MG tablet, Take 1 tablet (800 mg total) by mouth every 8 (eight) hours as needed., Disp: 21 tablet, Rfl: 0 .  lisinopril-hydrochlorothiazide (PRINZIDE,ZESTORETIC) 20-12.5 MG tablet, Take 1 tablet by  mouth daily., Disp: 90 tablet, Rfl: 0 .  lisinopril-hydrochlorothiazide (PRINZIDE,ZESTORETIC) 20-12.5 MG tablet, TAKE 1 TABLET DAILY (NEED AN APPOINTMENT), Disp: 90 tablet, Rfl: 1 .  loratadine (CLARITIN) 10 MG tablet, Take 10 mg by mouth daily., Disp: , Rfl:  .  MAGNESIUM PO, Take by mouth daily., Disp: , Rfl:  .  omeprazole (PRILOSEC) 40 MG capsule, Take 1 capsule (40 mg total) by mouth daily., Disp: 90 capsule, Rfl: 1 .  Polyethylene Glycol 3350 (MIRALAX PO), Take by mouth as needed., Disp: , Rfl:  .  SUMAtriptan (IMITREX) 25 MG tablet, Take 1 tablet (25 mg total) by mouth every 2 (two) hours as needed for migraine. May repeat in 2 hours if needed. Do not take more then 2 tablets in 24 hours., Disp: 10 tablet, Rfl: 0 .  tamoxifen (NOLVADEX) 20 MG tablet, TAKE 1 TABLET DAILY, Disp: 90 tablet, Rfl: 3 .  tiZANidine (ZANAFLEX) 2 MG tablet, Take 1 tablet (2 mg total) by mouth every 8 (eight) hours as needed for muscle spasms., Disp: 30 tablet, Rfl: 0 .  traMADol (ULTRAM) 50 MG tablet, Take 1 tablet (50 mg total) by mouth every 6 (six) hours as needed for severe pain., Disp: 15 tablet, Rfl: 0 .  TURMERIC PO, Take by mouth daily., Disp: , Rfl:  .  venlafaxine XR (EFFEXOR-XR) 75 MG 24 hr capsule, TAKE 1 CAPSULE DAILY WITH BREAKFAST, Disp: 90 capsule, Rfl: 2 .  VITAMIN E EX, Apply topically. Vaginal suppository, Disp: , Rfl:  No current facility-administered medications for this visit.   Facility-Administered Medications Ordered in Other Visits:  .  prochlorperazine (COMPAZINE) injection 10 mg, 10 mg, Intravenous, Q6H PRN, Delice Bison, Charlestine Massed, NP, 10 mg at 12/26/13 0917  EXAM:  Vitals:   03/29/17 1110  BP: 108/80  Pulse: 76  Temp: 98.4 F (36.9 C)    GENERAL: vitals reviewed and listed below, alert, oriented, appears well hydrated and in no acute distress  HEENT: head atraumatic, PERRLA, normal appearance of eyes, ears, nose and mouth. moist mucus membranes.  NECK: supple, no masses or  lymphadenopathy  LUNGS: clear to auscultation bilaterally, no rales, rhonchi or wheeze  CV: HRRR, no peripheral edema or cyanosis, normal pedal pulses  ABDOMEN: bowel sounds normal, soft, non tender to palpation, no masses, no rebound or guarding  SKIN: no rash or abnormal lesions  GU/BREAST: declined  MS: normal gait, moves all extremities normally  NEURO: normal gait, speech and thought processing grossly intact, muscle tone grossly intact throughout  PSYCH: normal affect, pleasant and cooperative  ASSESSMENT AND PLAN:  Discussed the following assessment and plan:  Encounter for preventative adult health care examination - Plan: Hemoglobin A1c, Lipid panel, HIV antibody (with reflex)  Essential hypertension - Plan: Basic metabolic panel, CBC  Gastroesophageal reflux disease, esophagitis presence not specified  Need for hepatitis C screening test - Plan: Hepatitis C antibody  Peripheral neuropathy due to chemotherapy (HCC), Chronic   -Discussed and advised all [place] preventive services health task force level A and B recommendations for age, sex and risks.  -Advised at least 150 minutes of exercise per week and a healthy diet with avoidance of (less then 1 serving per week) processed foods, white starches, red meat, fast foods and sweets and consisting of: * 5-9 servings of fresh fruits and vegetables (not corn or potatoes) *nuts and seeds, beans *olives and olive oil *lean meats such as fish and white chicken  *whole grains  -labs, studies and vaccines per orders this encounter  Orders Placed This Encounter  Procedures  . Basic metabolic panel  . CBC  . Hemoglobin A1c  . Lipid panel  . Hepatitis C antibody  . HIV antibody (with reflex)    Patient advised to return to clinic immediately if symptoms worsen or persist or new concerns.  Patient Instructions  BEFORE YOU LEAVE: -follow up: 3-4 months -labs  We have ordered labs or studies at this visit. It can  take up to 1-2 weeks for results and processing. IF results require follow up or explanation, we will  call you with instructions. Clinically stable results will be released to your [facility] If you have not heard from [place] or cannot find your results in [facility] in 2 weeks please contact our office at [phone].  If you are not yet signed up for [facility], please consider signing up.   Health Maintenance for Postmenopausal Women Menopause is a normal process in which your reproductive ability comes to an end. This process happens gradually over a span of months to years, usually between the ages of 49 and 43. Menopause is complete when you have missed 12 consecutive menstrual periods. It is important to talk with your health care provider about some of the most common conditions that affect postmenopausal women, such as heart disease, cancer, and bone loss (osteoporosis). Adopting a healthy lifestyle and getting preventive care can help to promote your health and wellness. Those actions can also lower your chances of developing some of these common conditions. What should I know about menopause? During menopause, you may experience a number of symptoms, such as:  Moderate-to-severe hot flashes.  Night sweats.  Decrease in sex drive.  Mood swings.  Headaches.  Tiredness.  Irritability.  Memory problems.  Insomnia.  Choosing to treat or not to treat menopausal changes is an individual decision that you make with your health care provider. What should I know about hormone replacement therapy and supplements? Hormone therapy products are effective for treating symptoms that are associated with menopause, such as hot flashes and night sweats. Hormone replacement carries certain risks, especially as you become older. If you are thinking about using estrogen or estrogen with progestin treatments, discuss the benefits and risks with your health care provider. What should I know about heart  disease and stroke? Heart disease, heart attack, and stroke become more likely as you age. This may be due, in part, to the hormonal changes that your body experiences during menopause. These can affect how your body processes dietary fats, triglycerides, and cholesterol. Heart attack and stroke are both medical emergencies. There are many things that you can do to help prevent heart disease and stroke:  Have your blood pressure checked at least every 1-2 years. High blood pressure causes heart disease and increases the risk of stroke.  If you are 40-75 years old, ask your health care provider if you should take aspirin to prevent a heart attack or a stroke.  Do not use any tobacco products, including cigarettes, chewing tobacco, or electronic cigarettes. If you need help quitting, ask your health care provider.  It is important to eat a healthy diet and maintain a healthy weight. ? Be sure to include plenty of vegetables, fruits, low-fat dairy products, and lean protein. ? Avoid eating foods that are high in solid fats, added sugars, or salt (sodium).  Get regular exercise. This is one of the most important things that you can do for your health. ? Try to exercise for at least 150 minutes each week. The type of exercise that you do should increase your heart rate and make you sweat. This is known as moderate-intensity exercise. ? Try to do strengthening exercises at least twice each week. Do these in addition to the moderate-intensity exercise.  Know your numbers.Ask your health care provider to check your cholesterol and your blood glucose. Continue to have your blood tested as directed by your health care provider.  What should I know about cancer screening? There are several types of cancer. Take the following steps to reduce your risk and  to catch any cancer development as early as possible. Breast Cancer  Practice breast self-awareness. ? This means understanding how your breasts  normally appear and feel. ? It also means doing regular breast self-exams. Let your health care provider know about any changes, no matter how small.  If you are 15 or older, have a clinician do a breast exam (clinical breast exam or CBE) every year. Depending on your age, family history, and medical history, it may be recommended that you also have a yearly breast X-ray (mammogram).  If you have a family history of breast cancer, talk with your health care provider about genetic screening.  If you are at high risk for breast cancer, talk with your health care provider about having an MRI and a mammogram every year.  Breast cancer (BRCA) gene test is recommended for women who have family members with BRCA-related cancers. Results of the assessment will determine the need for genetic counseling and BRCA1 and for BRCA2 testing. BRCA-related cancers include these types: ? Breast. This occurs in males or females. ? Ovarian. ? Tubal. This may also be called fallopian tube cancer. ? Cancer of the abdominal or pelvic lining (peritoneal cancer). ? Prostate. ? Pancreatic.  Cervical, Uterine, and Ovarian Cancer Your health care provider may recommend that you be screened regularly for cancer of the pelvic organs. These include your ovaries, uterus, and vagina. This screening involves a pelvic exam, which includes checking for microscopic changes to the surface of your cervix (Pap test).  For women ages 21-65, health care providers may recommend a pelvic exam and a Pap test every three years. For women ages 14-65, they may recommend the Pap test and pelvic exam, combined with testing for human papilloma virus (HPV), every five years. Some types of HPV increase your risk of cervical cancer. Testing for HPV may also be done on women of any age who have unclear Pap test results.  Other health care providers may not recommend any screening for nonpregnant women who are considered low risk for pelvic cancer  and have no symptoms. Ask your health care provider if a screening pelvic exam is right for you.  If you have had past treatment for cervical cancer or a condition that could lead to cancer, you need Pap tests and screening for cancer for at least 20 years after your treatment. If Pap tests have been discontinued for you, your risk factors (such as having a new sexual partner) need to be reassessed to determine if you should start having screenings again. Some women have medical problems that increase the chance of getting cervical cancer. In these cases, your health care provider may recommend that you have screening and Pap tests more often.  If you have a family history of uterine cancer or ovarian cancer, talk with your health care provider about genetic screening.  If you have vaginal bleeding after reaching menopause, tell your health care provider.  There are currently no reliable tests available to screen for ovarian cancer.  Lung Cancer Lung cancer screening is recommended for adults 47-21 years old who are at high risk for lung cancer because of a history of smoking. A yearly low-dose CT scan of the lungs is recommended if you:  Currently smoke.  Have a history of at least 30 pack-years of smoking and you currently smoke or have quit within the past 15 years. A pack-year is smoking an average of one pack of cigarettes per day for one year.  Yearly screening should:  Continue until it has been 15 years since you quit.  Stop if you develop a health problem that would prevent you from having lung cancer treatment.  Colorectal Cancer  This type of cancer can be detected and can often be prevented.  Routine colorectal cancer screening usually begins at age 40 and continues through age 71.  If you have risk factors for colon cancer, your health care provider may recommend that you be screened at an earlier age.  If you have a family history of colorectal cancer, talk with your  health care provider about genetic screening.  Your health care provider may also recommend using home test kits to check for hidden blood in your stool.  A small camera at the end of a tube can be used to examine your colon directly (sigmoidoscopy or colonoscopy). This is done to check for the earliest forms of colorectal cancer.  Direct examination of the colon should be repeated every 5-10 years until age 61. However, if early forms of precancerous polyps or small growths are found or if you have a family history or genetic risk for colorectal cancer, you may need to be screened more often.  Skin Cancer  Check your skin from head to toe regularly.  Monitor any moles. Be sure to tell your health care provider: ? About any new moles or changes in moles, especially if there is a change in a mole's shape or color. ? If you have a mole that is larger than the size of a pencil eraser.  If any of your family members has a history of skin cancer, especially at a young age, talk with your health care provider about genetic screening.  Always use sunscreen. Apply sunscreen liberally and repeatedly throughout the day.  Whenever you are outside, protect yourself by wearing long sleeves, pants, a wide-brimmed hat, and sunglasses.  What should I know about osteoporosis? Osteoporosis is a condition in which bone destruction happens more quickly than new bone creation. After menopause, you may be at an increased risk for osteoporosis. To help prevent osteoporosis or the bone fractures that can happen because of osteoporosis, the following is recommended:  If you are 7-72 years old, get at least 1,000 mg of calcium and at least 600 mg of vitamin D per day.  If you are older than age 2 but younger than age 89, get at least 1,200 mg of calcium and at least 600 mg of vitamin D per day.  If you are older than age 60, get at least 1,200 mg of calcium and at least 800 mg of vitamin D per day.  Smoking  and excessive alcohol intake increase the risk of osteoporosis. Eat foods that are rich in calcium and vitamin D, and do weight-bearing exercises several times each week as directed by your health care provider. What should I know about how menopause affects my mental health? Depression may occur at any age, but it is more common as you become older. Common symptoms of depression include:  Low or sad mood.  Changes in sleep patterns.  Changes in appetite or eating patterns.  Feeling an overall lack of motivation or enjoyment of activities that you previously enjoyed.  Frequent crying spells.  Talk with your health care provider if you think that you are experiencing depression. What should I know about immunizations? It is important that you get and maintain your immunizations. These include:  Tetanus, diphtheria, and pertussis (Tdap) booster vaccine.  Influenza every year before the  flu season begins.  Pneumonia vaccine.  Shingles vaccine.  Your health care provider may also recommend other immunizations. This information is not intended to replace advice given to you by your health care provider. Make sure you discuss any questions you have with your health care provider. Document Released: 09/07/2005 Document Revised: 02/03/2016 Document Reviewed: 04/19/2015 Elsevier Interactive Patient Education  2018 [employer].          No Follow-up on file.  Colin Benton R., DO

## 2017-03-29 ENCOUNTER — Other Ambulatory Visit: Payer: Self-pay | Admitting: Family Medicine

## 2017-03-29 ENCOUNTER — Telehealth: Payer: Self-pay | Admitting: Family Medicine

## 2017-03-29 ENCOUNTER — Ambulatory Visit (INDEPENDENT_AMBULATORY_CARE_PROVIDER_SITE_OTHER): Payer: BLUE CROSS/BLUE SHIELD | Admitting: Family Medicine

## 2017-03-29 ENCOUNTER — Encounter: Payer: Self-pay | Admitting: Family Medicine

## 2017-03-29 VITALS — BP 108/80 | HR 76 | Temp 98.4°F | Ht 64.5 in | Wt 180.6 lb

## 2017-03-29 DIAGNOSIS — I10 Essential (primary) hypertension: Secondary | ICD-10-CM

## 2017-03-29 DIAGNOSIS — T451X5A Adverse effect of antineoplastic and immunosuppressive drugs, initial encounter: Secondary | ICD-10-CM

## 2017-03-29 DIAGNOSIS — K219 Gastro-esophageal reflux disease without esophagitis: Secondary | ICD-10-CM

## 2017-03-29 DIAGNOSIS — Z0000 Encounter for general adult medical examination without abnormal findings: Secondary | ICD-10-CM | POA: Diagnosis not present

## 2017-03-29 DIAGNOSIS — Z1159 Encounter for screening for other viral diseases: Secondary | ICD-10-CM

## 2017-03-29 DIAGNOSIS — G620 Drug-induced polyneuropathy: Secondary | ICD-10-CM | POA: Diagnosis not present

## 2017-03-29 LAB — BASIC METABOLIC PANEL
BUN: 15 mg/dL (ref 6–23)
CO2: 32 mEq/L (ref 19–32)
Calcium: 9.7 mg/dL (ref 8.4–10.5)
Chloride: 99 mEq/L (ref 96–112)
Creatinine, Ser: 1.03 mg/dL (ref 0.40–1.20)
GFR: 71.68 mL/min (ref >60.00)
Glucose, Bld: 90 mg/dL (ref 70–99)
POTASSIUM: 3.9 meq/L (ref 3.5–5.1)
Sodium: 138 mEq/L (ref 135–145)

## 2017-03-29 LAB — LIPID PANEL
CHOLESTEROL: 221 mg/dL — AB (ref 0–200)
HDL: 59.5 mg/dL (ref >39.00)
LDL Cholesterol: 128 mg/dL — ABNORMAL HIGH (ref 0–99)
NonHDL: 161.7
Total CHOL/HDL Ratio: 4
Triglycerides: 168 mg/dL — ABNORMAL HIGH (ref 0.0–149.0)
VLDL: 33.6 mg/dL (ref 0.0–40.0)

## 2017-03-29 LAB — CBC
HCT: 36.8 % (ref 36.0–46.0)
HEMOGLOBIN: 12.5 g/dL (ref 12.0–15.0)
MCHC: 34 g/dL (ref 30.0–36.0)
MCV: 92.6 fl (ref 78.0–100.0)
Platelets: 239 10*3/uL (ref 150.0–400.0)
RBC: 3.98 Mil/uL (ref 3.87–5.11)
RDW: 12.8 % (ref 11.5–15.5)

## 2017-03-29 LAB — HEMOGLOBIN A1C: HEMOGLOBIN A1C: 6 % (ref 4.6–6.5)

## 2017-03-29 MED ORDER — VENLAFAXINE HCL ER 75 MG PO CP24: 150.0000 mg | ORAL_CAPSULE | Freq: Every day | ORAL | 3 refills | Status: DC

## 2017-03-29 NOTE — Progress Notes (Signed)
Discussed with pt. Ok to increase as she is not longer using tramadol. tramdol removed from list. New rx for 150mg  sent.

## 2017-03-29 NOTE — Patient Instructions (Signed)
BEFORE YOU LEAVE: -follow up: 3-4 months -labs  We have ordered labs or studies at this visit. It can take up to 1-2 weeks for results and processing. IF results require follow up or explanation, we will call you with instructions. Clinically stable results will be released to your [facility]. If you have not heard from [place] or cannot find your results in [facility] in 2 weeks please contact our office at [phone].  If you are not yet signed up for [facility], please consider signing up.   Health Maintenance for Postmenopausal Women Menopause is a normal process in which your reproductive ability comes to an end. This process happens gradually over a span of months to years, usually between the ages of 19 and 36. Menopause is complete when you have missed 12 consecutive menstrual periods. It is important to talk with your health care provider about some of the most common conditions that affect postmenopausal women, such as heart disease, cancer, and bone loss (osteoporosis). Adopting a healthy lifestyle and getting preventive care can help to promote your health and wellness. Those actions can also lower your chances of developing some of these common conditions. What should I know about menopause? During menopause, you may experience a number of symptoms, such as:  Moderate-to-severe hot flashes.  Night sweats.  Decrease in sex drive.  Mood swings.  Headaches.  Tiredness.  Irritability.  Memory problems.  Insomnia.  Choosing to treat or not to treat menopausal changes is an individual decision that you make with your health care provider. What should I know about hormone replacement therapy and supplements? Hormone therapy products are effective for treating symptoms that are associated with menopause, such as hot flashes and night sweats. Hormone replacement carries certain risks, especially as you become older. If you are thinking about using estrogen or estrogen with progestin  treatments, discuss the benefits and risks with your health care provider. What should I know about heart disease and stroke? Heart disease, heart attack, and stroke become more likely as you age. This may be due, in part, to the hormonal changes that your body experiences during menopause. These can affect how your body processes dietary fats, triglycerides, and cholesterol. Heart attack and stroke are both medical emergencies. There are many things that you can do to help prevent heart disease and stroke:  Have your blood pressure checked at least every 1-2 years. High blood pressure causes heart disease and increases the risk of stroke.  If you are 69-80 years old, ask your health care provider if you should take aspirin to prevent a heart attack or a stroke.  Do not use any tobacco products, including cigarettes, chewing tobacco, or electronic cigarettes. If you need help quitting, ask your health care provider.  It is important to eat a healthy diet and maintain a healthy weight. ? Be sure to include plenty of vegetables, fruits, low-fat dairy products, and lean protein. ? Avoid eating foods that are high in solid fats, added sugars, or salt (sodium).  Get regular exercise. This is one of the most important things that you can do for your health. ? Try to exercise for at least 150 minutes each week. The type of exercise that you do should increase your heart rate and make you sweat. This is known as moderate-intensity exercise. ? Try to do strengthening exercises at least twice each week. Do these in addition to the moderate-intensity exercise.  Know your numbers.Ask your health care provider to check your cholesterol and your  blood glucose. Continue to have your blood tested as directed by your health care provider.  What should I know about cancer screening? There are several types of cancer. Take the following steps to reduce your risk and to catch any cancer development as early as  possible. Breast Cancer  Practice breast self-awareness. ? This means understanding how your breasts normally appear and feel. ? It also means doing regular breast self-exams. Let your health care provider know about any changes, no matter how small.  If you are 40 or older, have a clinician do a breast exam (clinical breast exam or CBE) every year. Depending on your age, family history, and medical history, it may be recommended that you also have a yearly breast X-ray (mammogram).  If you have a family history of breast cancer, talk with your health care provider about genetic screening.  If you are at high risk for breast cancer, talk with your health care provider about having an MRI and a mammogram every year.  Breast cancer (BRCA) gene test is recommended for women who have family members with BRCA-related cancers. Results of the assessment will determine the need for genetic counseling and BRCA1 and for BRCA2 testing. BRCA-related cancers include these types: ? Breast. This occurs in males or females. ? Ovarian. ? Tubal. This may also be called fallopian tube cancer. ? Cancer of the abdominal or pelvic lining (peritoneal cancer). ? Prostate. ? Pancreatic.  Cervical, Uterine, and Ovarian Cancer Your health care provider may recommend that you be screened regularly for cancer of the pelvic organs. These include your ovaries, uterus, and vagina. This screening involves a pelvic exam, which includes checking for microscopic changes to the surface of your cervix (Pap test).  For women ages 21-65, health care providers may recommend a pelvic exam and a Pap test every three years. For women ages 30-65, they may recommend the Pap test and pelvic exam, combined with testing for human papilloma virus (HPV), every five years. Some types of HPV increase your risk of cervical cancer. Testing for HPV may also be done on women of any age who have unclear Pap test results.  Other health care  providers may not recommend any screening for nonpregnant women who are considered low risk for pelvic cancer and have no symptoms. Ask your health care provider if a screening pelvic exam is right for you.  If you have had past treatment for cervical cancer or a condition that could lead to cancer, you need Pap tests and screening for cancer for at least 20 years after your treatment. If Pap tests have been discontinued for you, your risk factors (such as having a new sexual partner) need to be reassessed to determine if you should start having screenings again. Some women have medical problems that increase the chance of getting cervical cancer. In these cases, your health care provider may recommend that you have screening and Pap tests more often.  If you have a family history of uterine cancer or ovarian cancer, talk with your health care provider about genetic screening.  If you have vaginal bleeding after reaching menopause, tell your health care provider.  There are currently no reliable tests available to screen for ovarian cancer.  Lung Cancer Lung cancer screening is recommended for adults 55-80 years old who are at high risk for lung cancer because of a history of smoking. A yearly low-dose CT scan of the lungs is recommended if you:  Currently smoke.  Have a history of   at least 30 pack-years of smoking and you currently smoke or have quit within the past 15 years. A pack-year is smoking an average of one pack of cigarettes per day for one year.  Yearly screening should:  Continue until it has been 15 years since you quit.  Stop if you develop a health problem that would prevent you from having lung cancer treatment.  Colorectal Cancer  This type of cancer can be detected and can often be prevented.  Routine colorectal cancer screening usually begins at age 58 and continues through age 59.  If you have risk factors for colon cancer, your health care provider may recommend  that you be screened at an earlier age.  If you have a family history of colorectal cancer, talk with your health care provider about genetic screening.  Your health care provider may also recommend using home test kits to check for hidden blood in your stool.  A small camera at the end of a tube can be used to examine your colon directly (sigmoidoscopy or colonoscopy). This is done to check for the earliest forms of colorectal cancer.  Direct examination of the colon should be repeated every 5-10 years until age 40. However, if early forms of precancerous polyps or small growths are found or if you have a family history or genetic risk for colorectal cancer, you may need to be screened more often.  Skin Cancer  Check your skin from head to toe regularly.  Monitor any moles. Be sure to tell your health care provider: ? About any new moles or changes in moles, especially if there is a change in a mole's shape or color. ? If you have a mole that is larger than the size of a pencil eraser.  If any of your family members has a history of skin cancer, especially at a young age, talk with your health care provider about genetic screening.  Always use sunscreen. Apply sunscreen liberally and repeatedly throughout the day.  Whenever you are outside, protect yourself by wearing long sleeves, pants, a wide-brimmed hat, and sunglasses.  What should I know about osteoporosis? Osteoporosis is a condition in which bone destruction happens more quickly than new bone creation. After menopause, you may be at an increased risk for osteoporosis. To help prevent osteoporosis or the bone fractures that can happen because of osteoporosis, the following is recommended:  If you are 88-58 years old, get at least 1,000 mg of calcium and at least 600 mg of vitamin D per day.  If you are older than age 90 but younger than age 79, get at least 1,200 mg of calcium and at least 600 mg of vitamin D per day.  If you  are older than age 63, get at least 1,200 mg of calcium and at least 800 mg of vitamin D per day.  Smoking and excessive alcohol intake increase the risk of osteoporosis. Eat foods that are rich in calcium and vitamin D, and do weight-bearing exercises several times each week as directed by your health care provider. What should I know about how menopause affects my mental health? Depression may occur at any age, but it is more common as you become older. Common symptoms of depression include:  Low or sad mood.  Changes in sleep patterns.  Changes in appetite or eating patterns.  Feeling an overall lack of motivation or enjoyment of activities that you previously enjoyed.  Frequent crying spells.  Talk with your health care provider if  you think that you are experiencing depression. What should I know about immunizations? It is important that you get and maintain your immunizations. These include:  Tetanus, diphtheria, and pertussis (Tdap) booster vaccine.  Influenza every year before the flu season begins.  Pneumonia vaccine.  Shingles vaccine.  Your health care provider may also recommend other immunizations. This information is not intended to replace advice given to you by your health care provider. Make sure you discuss any questions you have with your health care provider. Document Released: 09/07/2005 Document Revised: 02/03/2016 Document Reviewed: 04/19/2015 Elsevier Interactive Patient Education  2018 [employer].

## 2017-03-29 NOTE — Telephone Encounter (Signed)
Pt is calling to see if she could get the Rx venlafaxine with the increase that was discuss at visit today.   Pharm:  [facility] on [address]

## 2017-03-29 NOTE — Telephone Encounter (Signed)
Rx resent.

## 2017-03-30 LAB — HIV ANTIBODY (ROUTINE TESTING W REFLEX): HIV: NONREACTIVE

## 2017-03-30 LAB — HEPATITIS C ANTIBODY: HCV Ab: NONREACTIVE

## 2017-04-18 ENCOUNTER — Encounter: Payer: Self-pay | Admitting: Family Medicine

## 2017-04-23 ENCOUNTER — Encounter: Payer: Self-pay | Admitting: Hematology and Oncology

## 2017-04-23 ENCOUNTER — Ambulatory Visit (HOSPITAL_BASED_OUTPATIENT_CLINIC_OR_DEPARTMENT_OTHER): Payer: BLUE CROSS/BLUE SHIELD | Admitting: Hematology and Oncology

## 2017-04-23 ENCOUNTER — Telehealth: Payer: Self-pay | Admitting: Hematology and Oncology

## 2017-04-23 DIAGNOSIS — C50511 Malignant neoplasm of lower-outer quadrant of right female breast: Secondary | ICD-10-CM

## 2017-04-23 DIAGNOSIS — Z170 Estrogen receptor positive status [ER+]: Secondary | ICD-10-CM | POA: Diagnosis not present

## 2017-04-23 MED ORDER — TAMOXIFEN CITRATE 20 MG PO TABS: 20.0000 mg | ORAL_TABLET | Freq: Every day | ORAL | 3 refills | Status: DC

## 2017-04-23 NOTE — Assessment & Plan Note (Signed)
Right breast T2N0 IIA invasive ductal carcinoma, grade 3, ER 7% PR 6%, Ki-67 70%, HER-2/neu positive. Status post neoadjuvant [place] for just started 10/02/2013 4 cycles stopped due to neuropathy and fluid retention, gemcitabine carboplatin Herceptin Perjeta 2 with Zoladex to 28 days status post lumpectomy 03/01/2014 residual 0.6 cm tumor grade 3. Completed radiation therapy November 2015  Antiestrogen therapy:  1. Initially given Anastrozole 1 mg daily (Herceptin maintenance completed 10/20/2014) 2. Switch to tamoxifen March 2016 3. Switch to exemestane 01/24/2015 (due to muscle aches, hot flashes) 4. Switched back to tamoxifen 10/24/2015 ( due to less side effect comparatively to AI therapy)  Tamoxifen toxicities:  Breast cancer surveillance: 1. Mammogram 09/06/2016 is normal 2. Breast exam 04/23/2017 is normal  Leukopenia/neutropenia: We will recheck her CBC in 6 months. Myton is 1500   Return to clinic in 1 for follow-up

## 2017-04-23 NOTE — Telephone Encounter (Signed)
Scheduled appointments per 9/25 los. Patient did not want avs or calendar.

## 2017-04-23 NOTE — Progress Notes (Signed)
Patient Care Team: [name], DO as PCP - General (Family Medicine) [name], [name], MD as Consulting Physician (Emergency Medicine) [name], MD as Consulting Physician (Hematology and Oncology) [name], MD (Inactive) as Consulting Physician (Obstetrics and Gynecology) [name], MD as Consulting Physician (Rheumatology) [name], MD as Consulting Physician (Neurology) [name], MD as Consulting Physician (Hematology and Oncology)  DIAGNOSIS:  Encounter Diagnosis  Name Primary?  . Malignant neoplasm of lower-outer quadrant of right breast of female, estrogen receptor positive ([place])     SUMMARY OF ONCOLOGIC HISTORY:   Breast cancer of lower-outer quadrant of right female breast ([place])   09/03/2013 Initial Biopsy    Right breast needle core biopsy at 8 o'clock position: IDC with DCIS, grade 3.  ER+ (weak 7%), PR+ (weak 6%), HER2 positive (ratio 2.22, copy #3.55). Ki67 is 70%.       09/14/2013 Breast MRI    lower outer right breast 2.2 x 1.3 x 1.5 cm Irregular mass. In the upper outer left breast there was a 7 x 5 x 8 mm indeterminate enhancing mass. Also another 9 x 7 x 6 mm indeterminant enhancing mass at 9:00 in the left breast was noted      09/15/2013 Clinical Stage    T2Nx; Stage IIA, right breast invasive ductal carcinoma, grade 3, ER positive, HER-2/neu positive       09/16/2013 Procedure    Left breast needle core biopsy at 2 o'clock position: Benign.  Right axillary LN biopsy: Benign       09/29/2013 Echocardiogram    Pre-treatment EF: 65-70%      10/02/2013 - 01/18/2014 Neo-Adjuvant Chemotherapy    Docetaxel, Carboplatin, Trastuzumab, Pertuzumab X 4 cycles; Taxotere stopped for fluid retention replaced with Gemcitabine 800 mg/m2 and completed 2 more cycles of chemo;  (TCH-P x 4 cycles), (GCH-P x 2 cycles); Zolodex Q 4 weeks      11/16/2013 Procedure    Genetic testing negative.  Genes tested include BRCA1, BRCA2.       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 Negative margins. 3 right axillary SLN (-). HER2 repeated & negative (ratio 0.71, copy #1.75).      03/01/2014 Pathologic Stage    ypT1b, pN0, pMX ; Stage IA      04/27/2014 - 06/07/2014 Radiation Therapy    Adjuvant radiation therapy completed [name]). Right breast: total dose 50 Gy over 25 fractions.  Right breast boost: total dose 10 Gy over 5 fractions.       07/06/2014 Echocardiogram    Most recent EF: 60-65%. Being followed in [facility].       07/09/2014 Surgery    Bilat salpingo-oopherectomy and lysis of adhesions; surgical path benign. Uterus and cervix remain intact.       07/28/2014 - 11/16/2014 Anti-estrogen oral therapy    Anastrozole 1 mg daily started.  Planned duration of treatment: 5 years.  Anastrazole stopped 10/2014 d/t pain in hands and feet. Switched to Tamoxifen.        - 10/20/2014 Chemotherapy    1 year maintenance Herceptin completed       11/16/2014 - 01/24/2015 Anti-estrogen oral therapy    Tamoxifen 39m daily stopped due to persistent myalgias arthralgias and hot flashes      11/18/2014 Survivorship    Survivorship Care Plan given and reviewed with pt in-person.       01/24/2015 -  04/23/2016 Anti-estrogen oral therapy    Exemestane 25 mg once daily, switched to tamoxifen 20 mg daily 10/24/2015       CHIEF COMPLIANT: Follow-up on tamoxifen therapy  INTERVAL HISTORY: Tina [name] is a 56 year old with above-mentioned history of right breast cancer treated with neoadjuvant chemotherapy followed by surgery and radiation and is currently on antiestrogen therapy with tamoxifen. She switched from multiple drugs and finally decided tamoxifen is the best tolerated dry. She appears to be tolerating it fairly well. She does have hot flashes but she is able to tolerate fairly well.  REVIEW  OF SYSTEMS:   Constitutional: Denies fevers, chills or abnormal weight loss Eyes: Denies blurriness of vision Ears, nose, mouth, throat, and face: Denies mucositis or sore throat Respiratory: Denies cough, dyspnea or wheezes Cardiovascular: Denies palpitation, chest discomfort Gastrointestinal:  Denies nausea, heartburn or change in bowel habits Skin: Denies abnormal skin rashes Lymphatics: Denies new lymphadenopathy or easy bruising Neurological:Denies numbness, tingling or new weaknesses Behavioral/Psych: Mood is stable, no new changes  Extremities: No lower extremity edema Breast:  denies any pain or lumps or nodules in either breasts All other systems were reviewed with the patient and are negative.  I have reviewed the past medical history, past surgical history, social history and family history with the patient and they are unchanged from previous note.  ALLERGIES:  is allergic to adhesive [tape]; other; and nickel.  MEDICATIONS:  Current Outpatient Prescriptions  Medication Sig Dispense Refill  . aspirin 81 MG tablet Take 81 mg by mouth daily.    Marland Kitchen b complex vitamins capsule Take 1 capsule by mouth daily.    Marland Kitchen BIOTIN 5000 PO Take 1 capsule by mouth daily.     . Calcium Carbonate-Vit D-Min (CALTRATE 600+D PLUS MINERALS PO) Take 1 tablet by mouth daily.    [name] Calcium (STOOL SOFTENER PO) Take by mouth as needed.    . gabapentin (NEURONTIN) 300 MG capsule Take 1 capsule (300 mg total) by mouth 3 (three) times daily. 270 capsule 3  . ibuprofen (ADVIL,MOTRIN) 800 MG tablet Take 1 tablet (800 mg total) by mouth every 8 (eight) hours as needed. 21 tablet 0  . lisinopril-hydrochlorothiazide (PRINZIDE,ZESTORETIC) 20-12.5 MG tablet TAKE 1 TABLET DAILY (NEED AN APPOINTMENT) 90 tablet 1  . loratadine (CLARITIN) 10 MG tablet Take 10 mg by mouth daily.    Marland Kitchen MAGNESIUM PO Take by mouth daily.    Marland Kitchen omeprazole (PRILOSEC) 40 MG capsule Take 1 capsule (40 mg total) by mouth daily. 90  capsule 1  . Polyethylene Glycol 3350 (MIRALAX PO) Take by mouth as needed.    . SUMAtriptan (IMITREX) 25 MG tablet Take 1 tablet (25 mg total) by mouth every 2 (two) hours as needed for migraine. May repeat in 2 hours if needed. Do not take more then 2 tablets in 24 hours. 10 tablet 0  . tamoxifen (NOLVADEX) 20 MG tablet Take 1 tablet (20 mg total) by mouth daily. 90 tablet 3  . tiZANidine (ZANAFLEX) 2 MG tablet Take 1 tablet (2 mg total) by mouth every 8 (eight) hours as needed for muscle spasms. 30 tablet 0  . TURMERIC PO Take by mouth daily.    Marland Kitchen venlafaxine XR (EFFEXOR-XR) 75 MG 24 hr capsule Take 2 capsules (150 mg total) by mouth daily with breakfast. 90 capsule 3  . VITAMIN E EX Apply topically. Vaginal suppository     No current facility-administered medications for this visit.    Facility-Administered Medications Ordered in Other Visits  Medication Dose Route Frequency Provider Last Rate Last Dose  . prochlorperazine (COMPAZINE) injection 10 mg  10 mg Intravenous Q6H PRN Gardenia Phlegm, NP   10 mg at 12/26/13 4098    PHYSICAL EXAMINATION: ECOG PERFORMANCE STATUS: 1 - Symptomatic but completely ambulatory  Vitals:   04/23/17 1440  BP: 138/86  Pulse: 100  Resp: 18  Temp: 98.4 F (36.9 C)  SpO2: 98%   Filed Weights   04/23/17 1440  Weight: 183 lb 9.6 oz (83.3 kg)    GENERAL:alert, no distress and comfortable SKIN: skin color, texture, turgor are normal, no rashes or significant lesions EYES: normal, Conjunctiva are pink and non-injected, sclera clear OROPHARYNX:no exudate, no erythema and lips, buccal mucosa, and tongue normal  NECK: supple, thyroid normal size, non-tender, without nodularity LYMPH:  no palpable lymphadenopathy in the cervical, axillary or inguinal LUNGS: clear to auscultation and percussion with normal breathing effort HEART: regular rate & rhythm and no murmurs and no lower extremity edema ABDOMEN:abdomen soft, non-tender and normal bowel  sounds MUSCULOSKELETAL:no cyanosis of digits and no clubbing  NEURO: alert & oriented x 3 with fluent speech, no focal motor/sensory deficits EXTREMITIES: No lower extremity edema BREAST: No palpable masses or nodules in either right or left breasts. No palpable axillary supraclavicular or infraclavicular adenopathy no breast tenderness or nipple discharge. (exam performed in the presence of a chaperone)  LABORATORY DATA:  I have reviewed the data as listed   Chemistry      Component Value Date/Time   NA 138 03/29/2017 1146   NA 141 04/23/2016 1430   K 3.9 03/29/2017 1146   K 3.9 04/23/2016 1430   CL 99 03/29/2017 1146   CL 104 06/16/2012 1303   CO2 32 03/29/2017 1146   CO2 25 04/23/2016 1430   BUN 15 03/29/2017 1146   BUN 7.5 04/23/2016 1430   CREATININE 1.03 03/29/2017 1146   CREATININE 0.94 10/11/2016 1414   CREATININE 1.0 04/23/2016 1430      Component Value Date/Time   CALCIUM 9.7 03/29/2017 1146   CALCIUM 9.0 04/23/2016 1430   ALKPHOS 54 10/11/2016 1414   ALKPHOS 56 04/23/2016 1430   AST 21 10/11/2016 1414   AST 26 04/23/2016 1430   ALT 15 10/11/2016 1414   ALT 21 04/23/2016 1430   BILITOT 0.6 10/11/2016 1414   BILITOT 0.43 04/23/2016 1430       Lab Results  Component Value Date   WBC 2.4 Repeated and verified X2. (L) 03/29/2017   HGB 12.5 03/29/2017   HCT 36.8 03/29/2017   MCV 92.6 03/29/2017   PLT 239.0 03/29/2017   NEUTROABS 1,188 (L) 10/11/2016    ASSESSMENT & PLAN:  Breast cancer of lower-outer quadrant of right female breast Right breast T2N0 IIA invasive ductal carcinoma, grade 3, ER 7% PR 6%, Ki-67 70%, HER-2/neu positive. Status post neoadjuvant [place] for just started 10/02/2013 4 cycles stopped due to neuropathy and fluid retention, gemcitabine carboplatin Herceptin Perjeta 2 with Zoladex to 28 days status post lumpectomy 03/01/2014 residual 0.6 cm tumor grade 3. Completed radiation therapy November 2015  Antiestrogen therapy:  1. Initially  given Anastrozole 1 mg daily (Herceptin maintenance completed 10/20/2014) 2. Switch to tamoxifen March 2016 3. Switch to exemestane 01/24/2015 (due to muscle aches, hot flashes) 4. Switched back to tamoxifen 10/24/2015 ( due to less side effect comparatively to AI therapy)  Tamoxifen toxicities: Hot flashes  Breast cancer surveillance: 1. Mammogram 09/06/2016 is normal 2. Breast exam 04/23/2017 is normal  Return to  clinic in 1 for follow-up  I spent 25 minutes talking to the patient of which more than half was spent in counseling and coordination of care.  No orders of the defined types were placed in this encounter.  The patient has a good understanding of the overall plan. she agrees with it. she will call with any problems that may develop before the next visit here.   Rulon Eisenmenger, MD 04/23/17

## 2017-05-03 ENCOUNTER — Telehealth: Payer: Self-pay | Admitting: Family Medicine

## 2017-05-03 NOTE — Telephone Encounter (Signed)
[place]  Self [phone]  Jala dropped off a Live Smart Form to be filled out, Would like for you to call when complete and to also fax to number on forms.

## 2017-05-06 NOTE — Telephone Encounter (Signed)
Form received and placed in your folder for completion.

## 2017-05-06 NOTE — Telephone Encounter (Signed)
Completed. Returned to [employer].

## 2017-05-06 NOTE — Telephone Encounter (Signed)
I called the pt and left a detailed message the form was completed and left at the front desk for her to pick up.  A copy was sent to medical records for scanning.

## 2017-05-17 ENCOUNTER — Ambulatory Visit (INDEPENDENT_AMBULATORY_CARE_PROVIDER_SITE_OTHER): Payer: BLUE CROSS/BLUE SHIELD

## 2017-05-17 DIAGNOSIS — Z23 Encounter for immunization: Secondary | ICD-10-CM | POA: Diagnosis not present

## 2017-05-28 ENCOUNTER — Other Ambulatory Visit: Payer: Self-pay | Admitting: Family Medicine

## 2017-06-10 ENCOUNTER — Other Ambulatory Visit: Payer: Self-pay

## 2017-06-10 DIAGNOSIS — Z853 Personal history of malignant neoplasm of breast: Secondary | ICD-10-CM | POA: Diagnosis not present

## 2017-06-10 NOTE — Telephone Encounter (Signed)
Pt is asking for refills on Tizanidine and Tramadol. Advised pt that she may need OV for refills.

## 2017-06-11 ENCOUNTER — Telehealth: Payer: Self-pay | Admitting: Family Medicine

## 2017-06-11 NOTE — Telephone Encounter (Signed)
Copied from [place] [phone]. Topic: General - Other >> Jun 11, 2017 11:19 AM Carolyn Stare wrote: Reason for CRM: pt is following up a request for muscle relaxer and something for pain

## 2017-06-11 NOTE — Telephone Encounter (Signed)
Tina [name], please see prior note I think I addressed last week? Needs appt . I don't usually rx tramadol and opiod and it can interact with some of her medications.

## 2017-06-11 NOTE — Telephone Encounter (Signed)
I called the pt, informed her of the message below and scheduled an appt for 11/15 at 2:45pm.

## 2017-06-13 ENCOUNTER — Encounter: Payer: Self-pay | Admitting: Family Medicine

## 2017-06-13 ENCOUNTER — Ambulatory Visit (INDEPENDENT_AMBULATORY_CARE_PROVIDER_SITE_OTHER): Payer: BLUE CROSS/BLUE SHIELD | Admitting: Family Medicine

## 2017-06-13 VITALS — BP 128/80 | HR 66 | Temp 98.3°F | Ht 64.5 in | Wt 183.2 lb

## 2017-06-13 DIAGNOSIS — M546 Pain in thoracic spine: Secondary | ICD-10-CM | POA: Diagnosis not present

## 2017-06-13 MED ORDER — TIZANIDINE HCL 2 MG PO TABS: 2.0000 mg | ORAL_TABLET | Freq: Three times a day (TID) | ORAL | 0 refills | Status: DC | PRN

## 2017-06-13 NOTE — Patient Instructions (Addendum)
BEFORE YOU LEAVE: -follow up: 1-2 months  Use heat 15 minutes twice daily, tiger balm and or tylenol for pain as needed.  Muscle relaxer at night if needed.  Do the video 5-6 days per week:   "Independence from Pain 7-minute Video" - [email or web address]

## 2017-06-13 NOTE — Progress Notes (Signed)
HPI:  Chronic low back pain: History of degenerative arthritis in the neck, back, shoulders -History of fibromyalgia and saw Dr. Estanislado Pandy in the past -Ongoing for many many years, saw Dr. Lorin Mercy in the past with MRI and hip films -She has had injections, acupuncture, tramadol, trigger therapy and physical therapy -Found trigger therapy and physical therapy most helpful in the past -Was reaching for something and threw back out about 1 week ago -Pain has been in the left mid to lower thoracic region and the muscles -She is improving some now, pain is moderate at this point, worse with certain movements -No weakness, numbness, fevers or malaise  ROS: See pertinent positives and negatives per HPI.  Past Medical History:  Diagnosis Date  . Anxiety   . Breast cancer ([place]) 2015   lumpectomy  . Cancer of right breast ([place]) 09/03/13   Invasive Ductal Carcinoma/Ductal Carcinoma Insitu  biopsies  . Chronic low back pain 01/24/2016   -and R hip pain -seen by rheumatologist and ortho in the past -MRI with Dr. Lorin Mercy in 2015, R hip plain films in 2015 as well acupuncture, injections, tramadol not helpful trigger therapy at [facility] and PT helpful   . Degenerative joint disease    back neck and shoulder  . Fibromyalgia    Dr. Estanislado Pandy  . GERD (gastroesophageal reflux disease)    hx esophagitis  . Hot flashes   . Hypertension   . Leukopenia 06/16/2013  . Lupus    rheumatologist - Dr. Estanislado Pandy  . Migraines   . 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 Personal history of chemotherapy    2015  . Personal history of radiation therapy 2015  . S/P radiation therapy 04/27/2014-06/07/2014   1) Right breast, 50 Gy in 25 fractions/ 2) Right breast boost, 10 Gy in 5 fractions    Past Surgical History:  Procedure Laterality Date  . BREAST BIOPSY Left 2015   benign core  . BREAST BIOPSY Right 2015   malignant core   . BREAST SURGERY  03/01/2014   hx right  breast cancer- 3 lymph nodes removed and lumpectomy  . [place]   rt  . CESAREAN SECTION     x 3  . PORTACATH PLACEMENT  08/2013   still currently active   . ROBOTIC ASSISTED BILATERAL SALPINGO OOPHERECTOMY Bilateral 07/09/2014   Procedure: ROBOTIC ASSISTED BILATERAL SALPINGO OOPHORECTOM; :LYSIS OF ADHESIONS;  Surgeon: Marvene Staff, MD;  Location: [facility];  Service: Gynecology;  Laterality: Bilateral;  . TRIGGER FINGER RELEASE  2004   rt thumb  . trigger finger release left thumb  06/28/14   at orthopedic surgical center  . TUBAL LIGATION    . WISDOM TOOTH EXTRACTION      Family History  Problem Relation Age of Onset  . Hypertension Mother   . Dementia Mother        small vessel disease  . Cancer Mother 30       cervical  cancer  . Cancer Father 8       prostate ca  . Diabetes Father   . Arthritis Father     Social History   Socioeconomic History  . Marital status: Married    Spouse name: None  . Number of children: 1  . Years of education: None  . Highest education level: None  Social Needs  . Financial resource strain: None  . Food insecurity - worry: None  . Food  insecurity - inability: None  . Transportation needs - medical: None  . Transportation needs - non-medical: None  Occupational History    Comment: homemaker  Tobacco Use  . Smoking status: Former Smoker    Packs/day: 1.00    Years: 15.00    Pack years: 15.00    Types: Cigars, Cigarettes    Last attempt to quit: 02/23/2004    Years since quitting: 13.3  . Smokeless tobacco: Never Used  . Tobacco comment: remote smoking history  Substance and Sexual Activity  . Alcohol use: Yes    Alcohol/week: 0.0 oz    Comment: occ   . Drug use: Yes    Comment: Marjuana  . Sexual activity: Yes  Other Topics Concern  . None  Social History Narrative   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      Home Situation: lives with husband and 82 yo son      Spiritual Beliefs: Christian       Lifestyle: elliptical 3-4 times per week; working on diet as well over last year in 2014              Current Outpatient Medications:  .  aspirin 81 MG tablet, Take 81 mg by mouth daily., Disp: , Rfl:  .  b complex vitamins capsule, Take 1 capsule by mouth daily., Disp: , Rfl:  .  BIOTIN 5000 PO, Take 1 capsule by mouth daily. , Disp: , Rfl:  .  Calcium Carbonate-Vit D-Min (CALTRATE 600+D PLUS MINERALS PO), Take 1 tablet by mouth daily., Disp: , Rfl:  .  Docusate Calcium (STOOL SOFTENER PO), Take by mouth as needed., Disp: , Rfl:  .  gabapentin (NEURONTIN) 300 MG capsule, Take 1 capsule (300 mg total) by mouth 3 (three) times daily., Disp: 270 capsule, Rfl: 3 .  ibuprofen (ADVIL,MOTRIN) 800 MG tablet, Take 1 tablet (800 mg total) by mouth every 8 (eight) hours as needed., Disp: 21 tablet, Rfl: 0 .  lisinopril-hydrochlorothiazide (PRINZIDE,ZESTORETIC) 20-12.5 MG tablet, TAKE 1 TABLET DAILY (NEED AN APPOINTMENT), Disp: 90 tablet, Rfl: 1 .  loratadine (CLARITIN) 10 MG tablet, Take 10 mg by mouth daily., Disp: , Rfl:  .  MAGNESIUM PO, Take by mouth daily., Disp: , Rfl:  .  omeprazole (PRILOSEC) 40 MG capsule, Take 1 capsule (40 mg total) by mouth daily., Disp: 90 capsule, Rfl: 1 .  Polyethylene Glycol 3350 (MIRALAX PO), Take by mouth as needed., Disp: , Rfl:  .  SUMAtriptan (IMITREX) 25 MG tablet, Take 1 tablet (25 mg total) by mouth every 2 (two) hours as needed for migraine. May repeat in 2 hours if needed. Do not take more then 2 tablets in 24 hours., Disp: 10 tablet, Rfl: 0 .  tamoxifen (NOLVADEX) 20 MG tablet, Take 1 tablet (20 mg total) by mouth daily., Disp: 90 tablet, Rfl: 3 .  tiZANidine (ZANAFLEX) 2 MG tablet, Take 1 tablet (2 mg total) every 8 (eight) hours as needed by mouth for muscle spasms., Disp: 30 tablet, Rfl: 0 .  TURMERIC PO, Take by mouth daily., Disp: , Rfl:  .  venlafaxine XR (EFFEXOR-XR) 75 MG 24 hr capsule, TAKE 1 CAPSULE DAILY WITH BREAKFAST, Disp: 90 capsule, Rfl: 1 .   VITAMIN E EX, Apply topically. Vaginal suppository, Disp: , Rfl:  No current facility-administered medications for this visit.   Facility-Administered Medications Ordered in Other Visits:  .  prochlorperazine (COMPAZINE) injection 10 mg, 10 mg, Intravenous, Q6H PRN, Gardenia Phlegm, NP, 10 mg at 12/26/13 [phone]  EXAM:  Vitals:   06/13/17 1436  BP: 128/80  Pulse: 66  Temp: 98.3 F (36.8 C)    Body mass index is 30.96 kg/m.  GENERAL: vitals reviewed and listed above, alert, oriented, appears well hydrated and in no acute distress  HEENT: atraumatic, conjunttiva clear, no obvious abnormalities on inspection of external nose and ears  NECK: no obvious masses on inspection  LUNGS: clear to auscultation bilaterally, no wheezes, rales or rhonchi, good air movement  CV: HRRR, no peripheral edema  MS: moves all extremities without noticeable abnormality, reproducible tenderness to palpation in the lower thoracic paraspinal muscles on the left  PSYCH: pleasant and cooperative, no obvious depression or anxiety  ASSESSMENT AND PLAN:  Discussed the following assessment and plan:  Acute left-sided thoracic back pain  -we discussed possible serious and likely etiologies, workup and treatment, treatment risks and return precautions -after this discussion, Shamonique opted for heat, Tiger balm, home exercises -  Video link provided muscle relaxer -She may also consider osteopathic treatments-follow up advised in 1 month -Advised against opioids further manage musculoskeletal pain -of course, we advised Hillari  to return or notify a doctor immediately if symptoms worsen or persist or new concerns arise.   Patient Instructions  BEFORE YOU LEAVE: -follow up: 1-2 months  Use heat 15 minutes twice daily, tiger balm and or tylenol for pain as needed.  Muscle relaxer at night if needed.  Do the video 5-6 days per week:   "Independence from Pain 7-minute Video" -  [email or web address]    Colin Benton R., DO

## 2017-07-12 ENCOUNTER — Ambulatory Visit: Payer: BLUE CROSS/BLUE SHIELD | Admitting: Family Medicine

## 2017-07-12 ENCOUNTER — Other Ambulatory Visit: Payer: Self-pay | Admitting: Hematology and Oncology

## 2017-07-12 DIAGNOSIS — C50511 Malignant neoplasm of lower-outer quadrant of right female breast: Secondary | ICD-10-CM

## 2017-07-31 ENCOUNTER — Encounter: Payer: Self-pay | Admitting: Adult Health

## 2017-07-31 ENCOUNTER — Ambulatory Visit (HOSPITAL_BASED_OUTPATIENT_CLINIC_OR_DEPARTMENT_OTHER): Payer: BLUE CROSS/BLUE SHIELD | Admitting: Adult Health

## 2017-07-31 VITALS — BP 143/103 | HR 79 | Temp 98.6°F | Resp 16 | Ht 64.5 in | Wt 187.7 lb

## 2017-07-31 DIAGNOSIS — Z853 Personal history of malignant neoplasm of breast: Secondary | ICD-10-CM

## 2017-07-31 DIAGNOSIS — C50511 Malignant neoplasm of lower-outer quadrant of right female breast: Secondary | ICD-10-CM

## 2017-07-31 DIAGNOSIS — Z170 Estrogen receptor positive status [ER+]: Secondary | ICD-10-CM

## 2017-07-31 NOTE — Progress Notes (Signed)
CLINIC:  Survivorship   REASON FOR VISIT:  Routine follow-up for history of breast cancer.   BRIEF ONCOLOGIC HISTORY:    Breast cancer of lower-outer quadrant of right female breast ([place])   09/03/2013 Initial Biopsy    Right breast needle core biopsy at 8 o'clock position: IDC with DCIS, grade 3.  ER+ (weak 7%), PR+ (weak 6%), HER2 positive (ratio 2.22, copy #3.55). Ki67 is 70%.       09/14/2013 Breast MRI    lower outer right breast 2.2 x 1.3 x 1.5 cm Irregular mass. In the upper outer left breast there was a 7 x 5 x 8 mm indeterminate enhancing mass. Also another 9 x 7 x 6 mm indeterminant enhancing mass at 9:00 in the left breast was noted      09/15/2013 Clinical Stage    T2Nx; Stage IIA, right breast invasive ductal carcinoma, grade 3, ER positive, HER-2/neu positive       09/16/2013 Procedure    Left breast needle core biopsy at 2 o'clock position: Benign.  Right axillary LN biopsy: Benign       09/29/2013 Echocardiogram    Pre-treatment EF: 65-70%      10/02/2013 - 01/18/2014 Neo-Adjuvant Chemotherapy    Docetaxel, Carboplatin, Trastuzumab, Pertuzumab X 4 cycles; Taxotere stopped for fluid retention replaced with Gemcitabine 800 mg/m2 and completed 2 more cycles of chemo;  (TCH-P x 4 cycles), (GCH-P x 2 cycles); Zolodex Q 4 weeks      11/16/2013 Procedure    Genetic testing negative.  Genes tested include BRCA1, BRCA2.       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 Negative margins. 3 right axillary SLN (-). HER2 repeated & negative (ratio 0.71, copy #1.75).      03/01/2014 Pathologic Stage    ypT1b, pN0, pMX ; Stage IA      04/27/2014 - 06/07/2014 Radiation Therapy    Adjuvant radiation therapy completed [name]). Right breast: total dose 50 Gy over 25 fractions.  Right breast boost: total dose 10 Gy over 5 fractions.       07/06/2014  Echocardiogram    Most recent EF: 60-65%. Being followed in [place] clinic.       07/09/2014 Surgery    Bilat salpingo-oopherectomy and lysis of adhesions; surgical path benign. Uterus and cervix remain intact.       07/28/2014 - 11/16/2014 Anti-estrogen oral therapy    Anastrozole 1 mg daily started.  Planned duration of treatment: 5 years.  Anastrazole stopped 10/2014 d/t pain in hands and feet. Switched to Tamoxifen.        - 10/20/2014 Chemotherapy    1 year maintenance Herceptin completed       11/16/2014 - 01/24/2015 Anti-estrogen oral therapy    Tamoxifen 37m daily stopped due to persistent myalgias arthralgias and hot flashes      11/18/2014 Survivorship    Survivorship Care Plan given and reviewed with pt in-person.       01/24/2015 -  Anti-estrogen oral therapy    Exemestane 25 mg once daily, switched to tamoxifen 20 mg daily 10/24/2015        INTERVAL HISTORY:  [facility] of [facility] cancer.  Overall, she reports feeling quite well. She is taking Tamoxifen daily, and other than occasional hot flashes, she is tolerating it quite well.  REVIEW OF SYSTEMS:  Review of Systems  Constitutional: Negative for appetite change, chills, fatigue, fever and unexpected weight change.  HENT:   Negative for hearing loss, lump/mass and trouble swallowing.   Eyes: Negative for eye problems and icterus.  Respiratory: Negative for chest tightness, cough and shortness of breath.   Cardiovascular: Negative for chest pain, leg swelling and palpitations.  Gastrointestinal: Negative for abdominal distention, abdominal pain, constipation, diarrhea, nausea and vomiting.  Endocrine: Positive for hot flashes.  Musculoskeletal: Negative for arthralgias and back pain.  Skin: Negative for itching and rash.  Neurological: Negative for dizziness, extremity weakness, headaches and numbness.  Hematological:  Negative for adenopathy. Does not bruise/bleed easily.  Psychiatric/Behavioral: Negative for depression. The patient is not nervous/anxious.   Breast: Denies any new nodularity, masses, tenderness, nipple changes, or nipple discharge.       PAST MEDICAL/SURGICAL HISTORY:  Past Medical History:  Diagnosis Date  . Anxiety   . Breast cancer ([place]) 2015   lumpectomy  . Cancer of right breast ([place]) 09/03/13   Invasive Ductal Carcinoma/Ductal Carcinoma Insitu  biopsies  . Chronic low back pain 01/24/2016   -and R hip pain -seen by rheumatologist and ortho in the past -MRI with Dr. Lorin Mercy in 2015, R hip plain films in 2015 as well acupuncture, injections, tramadol not helpful trigger therapy at [facility] center and PT helpful   . Degenerative joint disease    back neck and shoulder  . Fibromyalgia    Dr. Estanislado Pandy  . GERD (gastroesophageal reflux disease)    hx esophagitis  . Hot flashes   . Hypertension   . Leukopenia 06/16/2013  . Lupus    rheumatologist - Dr. Estanislado Pandy  . Migraines   . 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 Personal history of chemotherapy    2015  . Personal history of radiation therapy 2015  . S/P radiation therapy 04/27/2014-06/07/2014   1) Right breast, 50 Gy in 25 fractions/ 2) Right breast boost, 10 Gy in 5 fractions   Past Surgical History:  Procedure Laterality Date  . BREAST BIOPSY Left 2015   benign core  . BREAST BIOPSY Right 2015   malignant core   . BREAST SURGERY  03/01/2014   hx right breast cancer- 3 lymph nodes removed and lumpectomy  . [place]   rt  . CESAREAN SECTION     x 3  . PORTACATH PLACEMENT  08/2013   still currently active   . ROBOTIC ASSISTED BILATERAL SALPINGO OOPHERECTOMY Bilateral 07/09/2014   Procedure: ROBOTIC ASSISTED BILATERAL SALPINGO OOPHORECTOM; :LYSIS OF ADHESIONS;  Surgeon: Marvene Staff, MD;  Location: [facility];  Service: Gynecology;  Laterality: Bilateral;  .  TRIGGER FINGER RELEASE  2004   rt thumb  . trigger finger release left thumb  06/28/14   at orthopedic surgical center  . TUBAL LIGATION    . WISDOM TOOTH EXTRACTION       ALLERGIES:  Allergies  Allergen Reactions  . Adhesive [Tape] Itching    Burned and scarred skinned  . Other     Cholroprep-causes rash  . Nickel Rash     CURRENT MEDICATIONS:  Outpatient Encounter Medications as of 07/31/2017  Medication Sig  . aspirin 81 MG tablet Take 81 mg by mouth daily.  Marland Kitchen b complex vitamins capsule Take 1 capsule by mouth daily.  Marland Kitchen BIOTIN 5000 PO Take 1 capsule by mouth daily.   . Calcium Carbonate-Vit D-Min (CALTRATE 600+D  PLUS MINERALS PO) Take 1 tablet by mouth daily.  [name] Calcium (STOOL SOFTENER PO) Take by mouth as needed.  . gabapentin (NEURONTIN) 300 MG capsule Take 1 capsule (300 mg total) by mouth 3 (three) times daily.  Marland Kitchen ibuprofen (ADVIL,MOTRIN) 800 MG tablet Take 1 tablet (800 mg total) by mouth every 8 (eight) hours as needed.  Marland Kitchen lisinopril-hydrochlorothiazide (PRINZIDE,ZESTORETIC) 20-12.5 MG tablet TAKE 1 TABLET DAILY (NEED AN APPOINTMENT)  . loratadine (CLARITIN) 10 MG tablet Take 10 mg by mouth daily.  Marland Kitchen MAGNESIUM PO Take by mouth daily.  Marland Kitchen omeprazole (PRILOSEC) 40 MG capsule Take 1 capsule (40 mg total) by mouth daily.  Marland Kitchen omeprazole (PRILOSEC) 40 MG capsule TAKE ONE CAPSULE BY MOUTH ONCE DAILY  . Polyethylene Glycol 3350 (MIRALAX PO) Take by mouth as needed.  . SUMAtriptan (IMITREX) 25 MG tablet Take 1 tablet (25 mg total) by mouth every 2 (two) hours as needed for migraine. May repeat in 2 hours if needed. Do not take more then 2 tablets in 24 hours.  . tamoxifen (NOLVADEX) 20 MG tablet Take 1 tablet (20 mg total) by mouth daily.  Marland Kitchen tiZANidine (ZANAFLEX) 2 MG tablet Take 1 tablet (2 mg total) every 8 (eight) hours as needed by mouth for muscle spasms.  . TURMERIC PO Take by mouth daily.  Marland Kitchen venlafaxine XR (EFFEXOR-XR) 75 MG 24 hr capsule TAKE 1 CAPSULE DAILY WITH  BREAKFAST  . VITAMIN E EX Apply topically. Vaginal suppository   Facility-Administered Encounter Medications as of 07/31/2017  Medication  . prochlorperazine (COMPAZINE) injection 10 mg     ONCOLOGIC FAMILY HISTORY:  Family History  Problem Relation Age of Onset  . Hypertension Mother   . Dementia Mother        small vessel disease  . Cancer Mother 30       cervical  cancer  . Cancer Father 1       prostate ca  . Diabetes Father   . Arthritis Father     GENETIC COUNSELING/TESTING: See above  SOCIAL HISTORY:  Estee Yohe is married and lives with her husband in [place], [place].  She has 3 boys, and they live in [place] also.  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  She denies any current or history of tobacco, alcohol, or illicit drug use.     PHYSICAL EXAMINATION:  Vital Signs: Vitals:   07/31/17 0958  BP: (!) 143/103  Pulse: 79  Resp: 16  Temp: 98.6 F (37 C)  SpO2: 99%   Filed Weights   07/31/17 0958  Weight: 187 lb 11.2 oz (85.1 kg)   General: Well-nourished, well-appearing female in no acute distress.  Unaccompanied today.   HEENT: Head is normocephalic.  Pupils equal and reactive to light. Conjunctivae clear without exudate.  Sclerae anicteric. Oral mucosa is pink, moist.  Oropharynx is pink without lesions or erythema.  Lymph: No cervical, supraclavicular, or infraclavicular lymphadenopathy noted on palpation.  Cardiovascular: Regular rate and rhythm.Marland Kitchen Respiratory: Clear to auscultation bilaterally. Chest expansion symmetric; breathing non-labored.  Breast Exam:  -Left breast: No appreciable masses on palpation. No skin redness, thickening, or peau d'orange appearance; no nipple retraction or nipple discharge;  -Right breast: No appreciable masses on palpation. No skin redness, thickening, or peau d'orange appearance; no nipple retraction or nipple discharge; mild distortion in symmetry at previous lumpectomy  site well healed scar without erythema or nodularity. -Axilla: No axillary adenopathy bilaterally.  GI: Abdomen soft and round; non-tender,  non-distended. Bowel sounds normoactive. No hepatosplenomegaly.   GU: Deferred.  Neuro: No focal deficits. Steady gait.  Psych: Mood and affect normal and appropriate for situation.  MSK: No focal spinal tenderness to palpation, full range of motion in bilateral upper extremities Extremities: No edema. Skin: Warm and dry.  LABORATORY DATA:  None for this visit   DIAGNOSTIC IMAGING:  Most recent mammogram:     ASSESSMENT AND PLAN:  Ms.. Ciulla is a pleasant 57 y.o. female with history of Stage IIA right breast invasive ductal carcinoma, ER+/PR+/HER2+, diagnosed in 08/2013, treated with neo-adjuvant chemotherapy, lumpectomy, adjuvant radiation therapy, maintenance trastuzumab x 1 year and anti-estrogen therapy with Tamoxifen (has also taken Exemestane) in 10/2014.  She presents to the [facility] for surveillance and routine follow-up.   1. History of breast cancer:  Ms. Ortlieb is currently clinically and radiographically without evidence of disease or recurrence of breast cancer. She will be due for mammogram in 08/2017; orders placed today.  She will continue her anti-estrogen therapy with Tamoxifen, with plans to continue for 5 years.  She will return to the cancer center to see her medical oncologist, Dr. Lindi Adie, in 03/2018.  I encouraged her to call me with any questions or concerns before her next visit at the cancer center, and I would be happy to see her sooner, if needed.    2. Cancer screening:  Due to Ms. Ohair's history and her age, she should receive screening for skin cancers, colon cancer. She was encouraged to follow-up with her PCP for appropriate cancer screenings.   3. Health maintenance and wellness promotion: Ms. Blyth was encouraged to consume 5-7 servings of fruits and vegetables per day. She was also encouraged to  engage in moderate to vigorous exercise for 30 minutes per day most days of the week. She was instructed to limit her alcohol consumption and continue to abstain from tobacco use.    Dispo:  -Return to cancer center in 03/2018 for f/u with Dr. Lindi Adie   A total of (30) minutes of face-to-face time was spent with this patient with greater than 50% of that time in counseling and care-coordination.   Gardenia Phlegm, Alex [phone]   Note: PRIMARY CARE PROVIDER [name], [place] [phone]

## 2017-08-01 ENCOUNTER — Other Ambulatory Visit: Payer: Self-pay | Admitting: Oncology

## 2017-08-02 ENCOUNTER — Encounter: Payer: BLUE CROSS/BLUE SHIELD | Admitting: Adult Health

## 2017-08-05 ENCOUNTER — Other Ambulatory Visit: Payer: Self-pay | Admitting: Hematology and Oncology

## 2017-08-05 DIAGNOSIS — Z9889 Other specified postprocedural states: Secondary | ICD-10-CM

## 2017-08-08 ENCOUNTER — Encounter: Payer: Self-pay | Admitting: Family Medicine

## 2017-08-13 ENCOUNTER — Ambulatory Visit: Payer: BLUE CROSS/BLUE SHIELD | Admitting: Family Medicine

## 2017-08-16 DIAGNOSIS — Z853 Personal history of malignant neoplasm of breast: Secondary | ICD-10-CM | POA: Diagnosis not present

## 2017-09-03 ENCOUNTER — Other Ambulatory Visit: Payer: Self-pay | Admitting: Family Medicine

## 2017-09-04 NOTE — Progress Notes (Signed)
Office Visit Note  Patient: Tina [name]             Date of Birth: [birth year]           MRN: [id number]             PCP: [name], DO Referring: [name], DO Visit Date: 09/06/2017 Occupation: @GUAROCC @    Subjective:  Generalized pain    History of Present Illness: Teaghan Melrose is a 57 y.o. female with history of autoimmune disease, osteoarthritis, DDD, and fibromyalgia.  Patient states that she is having a fibromyalgia flare.  She is having trapezius muscle tenderness and muscle tension especially on the left side.  She has been using a heating pad and tiger balm.  She has been trying to do yoga on a regular basis. She states that her pain is radiating up her neck and left side of her face.  She denies any numbness or tingling.  No chest pain or shortness of breath.  No shoulder pain.  She states that over the past week she has been having increased headaches and nausea.  She states that she feels lightheaded and has been experiencing some blurry vision.  She states this has happened several times on the right side over the years.  She states that in the past she was given Prednisone, which helped resolve her symptoms.  She was told previously it was due to fluid in her ears.  She denies any sensitivity to light or sound.   She performs neck exercises daily.  She states that she has only been taking Gabapentin 300 mg daily.  She is prescribed Gabapentin 300 mg TID.  She reports she needs a refill of Zanaflex from her PCP.  She denies any hand or feet pain.  She denies any joint swelling.  She states experiences joint stiffness.  States she is also been experiencing right Achilles tendinitis.  She denies any trochanteric bursitis at this time.  She states she has not had a flare of her autoimmune disease.  She does have a lesion behind her right ear.  She denies any oral or nasal ulcers, or lymphadenopathy.  She denies any new rashes.  She has been experiencing intermittent  photosensitivity.  She states that she originally discontinued Plaquenil due to not wanting to take as many medications.  She denies noticing a difference since discontinuing PLQ.    Activities of Daily Living:  Patient reports morning stiffness for 30-60 minutes.   Patient Reports nocturnal pain.  Difficulty dressing/grooming: Denies Difficulty climbing stairs: Denies Difficulty getting out of chair: Denies Difficulty using hands for taps, buttons, cutlery, and/or writing: Denies   Review of Systems  Constitutional: Positive for fatigue. Negative for weakness.  HENT: Positive for mouth dryness. Negative for nose dryness.   Eyes: Positive for dryness. Negative for pain, redness, itching and visual disturbance.  Respiratory: Negative for cough, hemoptysis, shortness of breath and difficulty breathing.   Cardiovascular: Negative for chest pain, palpitations, hypertension, irregular heartbeat and swelling in legs/feet.  Gastrointestinal: Positive for constipation. Negative for blood in stool and diarrhea.  Endocrine: Negative for increased urination.  Genitourinary: Negative for painful urination.  Musculoskeletal: Positive for arthralgias, joint pain, myalgias, morning stiffness, muscle tenderness and myalgias. Negative for joint swelling and muscle weakness.  Skin: Positive for sensitivity to sunlight. Negative for color change, rash, hair loss, nodules/bumps, redness, skin tightness and ulcers.  Neurological: Positive for light-headedness. Negative for dizziness, numbness and headaches.  Hematological: Negative for  swollen glands.  Psychiatric/Behavioral: Positive for sleep disturbance. Negative for depressed mood. The patient is not nervous/anxious.     PMFS History:  Patient Active Problem List   Diagnosis Date Noted  . Fibromyalgia 02/28/2016  . Chronic low back pain 01/24/2016  . Lupus 04/19/2015  . GERD (gastroesophageal reflux disease) 04/19/2015  . Cervicogenic headache  02/10/2015  . Neck pain 02/10/2015  . Hot flashes 10/26/2014  . GAD (generalized anxiety disorder) 10/26/2014  . Peripheral neuropathy due to chemotherapy ([place]) 03/12/2014  . Breast cancer of lower-outer quadrant of right female breast ([place]) 09/15/2013  . Left shoulder pain - seeing Dr. Lorin Mercy 08/20/2013  . Hypertension     Past Medical History:  Diagnosis Date  . Anxiety   . Breast cancer ([place]) 2015   lumpectomy  . Cancer of right breast ([place]) 09/03/13   Invasive Ductal Carcinoma/Ductal Carcinoma Insitu  biopsies  . Chronic low back pain 01/24/2016   -and R hip pain -seen by rheumatologist and ortho in the past -MRI with Dr. Lorin Mercy in 2015, R hip plain films in 2015 as well acupuncture, injections, tramadol not helpful [facility] and PT helpful   . Degenerative joint disease    back neck and shoulder  . Fibromyalgia    Dr. Estanislado Pandy  . GERD (gastroesophageal reflux disease)    hx esophagitis  . Hot flashes   . Hypertension   . Leukopenia 06/16/2013  . Lupus    rheumatologist - Dr. Estanislado Pandy  . Migraines   . Neuromuscular disorder ([place])    neuropathy from chemo in feet/hands  . Personal history of chemotherapy    2015  . Personal history of radiation therapy 2015  . S/P radiation therapy 04/27/2014-06/07/2014   1) Right breast, 50 Gy in 25 fractions/ 2) Right breast boost, 10 Gy in 5 fractions    Family History  Problem Relation Age of Onset  . Hypertension Mother   . Dementia Mother        small vessel disease  . Cancer Mother 30       cervical  cancer  . Cancer Father 79       prostate ca  . Diabetes Father   . Arthritis Father    Past Surgical History:  Procedure Laterality Date  . BREAST BIOPSY Left 2015   benign core  . BREAST BIOPSY Right 2015   malignant core   . BREAST SURGERY  03/01/2014   hx right breast cancer- 3 lymph nodes removed and lumpectomy  . [place]   rt  . CESAREAN SECTION     x 3  . PORTACATH  PLACEMENT  08/2013   still currently active   . ROBOTIC ASSISTED BILATERAL SALPINGO OOPHERECTOMY Bilateral 07/09/2014   Procedure: ROBOTIC ASSISTED BILATERAL SALPINGO OOPHORECTOM; :LYSIS OF ADHESIONS;  Surgeon: Marvene Staff, MD;  Location: [facility];  Service: Gynecology;  Laterality: Bilateral;  . TRIGGER FINGER RELEASE  2004   rt thumb  . trigger finger release left thumb  06/28/14   [facility]  . TUBAL LIGATION    . WISDOM TOOTH EXTRACTION     Social History   Social History Narrative   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      Home Situation: lives with husband and 39 yo son      Spiritual Beliefs: Christian      Lifestyle: elliptical 3-4 times per week; working on diet as well over last year in 2014  Objective: Vital Signs: BP 114/80 (BP Location: Left Arm, Patient Position: Sitting, Cuff Size: Normal)   Pulse 81   Resp 16   Ht 5\' 4"  (1.626 m)   Wt 184 lb (83.5 kg)   BMI 31.58 kg/m    Physical Exam  Constitutional: She is oriented to person, place, and time. She appears well-developed and well-nourished.  HENT:  Head: Normocephalic and atraumatic.  No oral or nasal ulcers noted. She has a small ulceration behind her right ear. No drainage or erythema.    Eyes: Conjunctivae and EOM are normal.  Neck: Normal range of motion.  Cardiovascular: Normal rate, regular rhythm, normal heart sounds and intact distal pulses.  Pulmonary/Chest: Effort normal and breath sounds normal.  Abdominal: Soft. Bowel sounds are normal.  Lymphadenopathy:    She has no cervical adenopathy.  Neurological: She is alert and oriented to person, place, and time.  Skin: Skin is warm and dry. Capillary refill takes less than 2 seconds.  No malar rash noted.  Psychiatric: She has a normal mood and affect. Her behavior is normal.  Nursing note and vitals reviewed.    Musculoskeletal Exam: C-spine, thoracic, and lumbar spine good ROM.  Shoulder joints, elbow joints,  wrist joints, MCPs, PIPs, and DIPs good ROM with no synovitis.  She has DIP synovial thickening consistent with osteoarthritis.  Hip joints, knee joints, ankle joints, MTPs, PIPs, and DIPs good ROM with no synovitis.  She has bilateral knee crepitus.  No warmth or effusion.  She has no trochanteric bursitis. She has trapezius muscle tension and muscle tenderness.    CDAI Exam: No CDAI exam completed.    Investigation: No additional findings. CBC Latest Ref Rng & Units 03/29/2017 10/11/2016 04/23/2016  WBC 4.0 - 10.5 K/uL 2.4 Repeated and verified X2.(L) 3.3(L) 2.9(L)  Hemoglobin 12.0 - 15.0 g/dL 12.5 13.0 12.7  Hematocrit 36.0 - 46.0 % 36.8 39.1 37.2  Platelets 150.0 - 400.0 K/uL 239.0 275 219   CMP Latest Ref Rng & Units 03/29/2017 10/11/2016 04/23/2016  Glucose 70 - 99 mg/dL 90 77 110  BUN 6 - 23 mg/dL 15 8 7.5  Creatinine 0.40 - 1.20 mg/dL 1.03 0.94 1.0  Sodium 135 - 145 mEq/L 138 140 141  Potassium 3.5 - 5.1 mEq/L 3.9 3.9 3.9  Chloride 96 - 112 mEq/L 99 102 -  CO2 19 - 32 mEq/L 32 25 25  Calcium 8.4 - 10.5 mg/dL 9.7 9.7 9.0  Total Protein 6.1 - 8.1 g/dL - 7.7 7.4  Total Bilirubin 0.2 - 1.2 mg/dL - 0.6 0.43  Alkaline Phos 33 - 130 U/L - 54 56  AST 10 - 35 U/L - 21 26  ALT 6 - 29 U/L - 15 21    Imaging: No results found.  Speciality Comments: No specialty comments available.    Procedures:  Trigger Point Inj Date/Time: 09/06/2017 10:48 AM Performed by: Ofilia Neas, PA-C Authorized by: Ofilia Neas, PA-C   Consent Given by:  Patient Site marked: the procedure site was marked   Timeout: prior to procedure the correct patient, procedure, and site was verified   Indications:  Therapeutic, muscle spasm and pain Total # of Trigger Points:  1 Location: neck   Needle Size:  27 G Approach:  Dorsal (Left trapezius trigger point injection ) Medications #1:  0.5 mL lidocaine 1 %; 10 mg triamcinolone acetonide 40 MG/ML Patient tolerance:  Patient tolerated the procedure well  with no immediate complications    Allergies: Adhesive [tape];  Other; and Nickel   Assessment / Plan:     Visit Diagnoses: Autoimmune disease ([place]) - + ANA 1:320 Speckled-Hx of neutropenia, fatigue, oral ulcers, sicca symptoms, parotitis, Previously on PLQ.  She continues ot have mild photosensitivity.  No oral or nasal ulcers.  She has an ulceration behind her right ear, which are recurrent.  The ulcer does not appear to be infected.  She denies any recent fevers.  She has no lymphadenopathy on exam.  No malar rash noted.  No synovitis on exam. Autoimmune labs were ordered today.  She continues to not take Plaquenil.  She denies any recent autoimmune flares.  She is having increased generalized pain and muscle tenderness that is likely due to her fibromyalgia.  - Plan: CBC with Differential/Platelet, COMPLETE METABOLIC PANEL WITH GFR, Urinalysis, Routine w reflex microscopic, ANA, Anti-DNA antibody, double-stranded, C3 and C4, Sedimentation rate  Fibromyalgia - She has been having increased generalized pain.  She has trapeizus muscle tenderness and muscle tension.  She takes Zanaflex PRN.  She has been having an increase in the frequency of headaches.  She was advised to follow up with PCP about headaches and lightheadedness.  She has no tenderness in the temporal region. She needs a refill from her PCP.  She is going to call their office today for a refill.  She has been taking Gabapentin 300 mg daily.  She is going to start taking it as prescribed 300 mg po TID.  She has been having insomnia and fatigue.  Discussed good sleep hygiene.    Trapezius muscle spasm -She has muscle tension and muscle tenderness in the trapezius region, especially on the left side.  A trigger point cortisone injection was performed today.  She tolerated the procedure well.   She is going to call her PCP for a refill of Zanaflex.  Advised her to massage the area and work on neck exercises at home.  Plan: Trigger Point Inj    Cyclic citrullinated peptide (CCP) antibody positive - CCP and 14-3-3 eta ordered today.  She has no synovitis evident on exam.  Plan: 14-3-3 eta Protein, Cyclic citrul peptide antibody, IgG  Primary osteoarthritis of both hands: DIP synovial thickening consistent with osteoarthritis.  She has no synovitis.  She has no discomfort or tenderness.  She has complete fist formation.  Joint protection and muscle strengthening were discussed.  She was given a prescription for Voltaren gel.    Primary osteoarthritis of both hips: Right hip slightly limited ROM with discomfort.  Left hip good ROM. No abnormalities in gait.     Primary osteoarthritis of both feet: She has good ROM with no synovitis.  She has no discomfort at this time.  Discussed proper fitting shoes.    DDD (degenerative disc disease), cervical: She has good ROM of her C-spine.  She has trapezius muscle tensio and muscle tenderness.    Osteopenia, unspecified location -Vitamin D level was checked today.  Plan: VITAMIN D 25 Hydroxy (Vit-D Deficiency, Fractures)    Orders: Orders Placed This Encounter  Procedures  . Trigger Point Inj  . CBC with Differential/Platelet  . COMPLETE METABOLIC PANEL WITH GFR  . Urinalysis, Routine w reflex microscopic  . ANA  . Anti-DNA antibody, double-stranded  . C3 and C4  . Sedimentation rate  . VITAMIN D 25 Hydroxy (Vit-D Deficiency, Fractures)  . 14-3-3 eta Protein  . Cyclic citrul peptide antibody, IgG   Meds ordered this encounter  Medications  . diclofenac sodium (VOLTAREN) 1 %  GEL    Sig: Apply 3 grams to 3 large joint up to 3 times daily    Dispense:  3 Tube    Refill:  3    Face-to-face time spent with patient was 30 minutes. 50% of time was spent in counseling and coordination of care.  Follow-Up Instructions: Return for autoimmune disease, fibromyalgia, osteoarthritis, DDD .   Ofilia Neas, PA-C  Note - This record has been created using Dragon software.  Chart  creation errors have been sought, but may not always  have been located. Such creation errors do not reflect on  the standard of medical care.

## 2017-09-06 ENCOUNTER — Ambulatory Visit (INDEPENDENT_AMBULATORY_CARE_PROVIDER_SITE_OTHER): Payer: BLUE CROSS/BLUE SHIELD | Admitting: Physician Assistant

## 2017-09-06 ENCOUNTER — Encounter: Payer: Self-pay | Admitting: Physician Assistant

## 2017-09-06 VITALS — BP 114/80 | HR 81 | Resp 16 | Ht 64.0 in | Wt 184.0 lb

## 2017-09-06 DIAGNOSIS — D8989 Other specified disorders involving the immune mechanism, not elsewhere classified: Secondary | ICD-10-CM

## 2017-09-06 DIAGNOSIS — M19042 Primary osteoarthritis, left hand: Secondary | ICD-10-CM

## 2017-09-06 DIAGNOSIS — M160 Bilateral primary osteoarthritis of hip: Secondary | ICD-10-CM | POA: Diagnosis not present

## 2017-09-06 DIAGNOSIS — M19071 Primary osteoarthritis, right ankle and foot: Secondary | ICD-10-CM

## 2017-09-06 DIAGNOSIS — R7989 Other specified abnormal findings of blood chemistry: Secondary | ICD-10-CM | POA: Diagnosis not present

## 2017-09-06 DIAGNOSIS — M19072 Primary osteoarthritis, left ankle and foot: Secondary | ICD-10-CM | POA: Diagnosis not present

## 2017-09-06 DIAGNOSIS — M797 Fibromyalgia: Secondary | ICD-10-CM | POA: Diagnosis not present

## 2017-09-06 DIAGNOSIS — M62838 Other muscle spasm: Secondary | ICD-10-CM

## 2017-09-06 DIAGNOSIS — M359 Systemic involvement of connective tissue, unspecified: Secondary | ICD-10-CM

## 2017-09-06 DIAGNOSIS — M8580 Other specified disorders of bone density and structure, unspecified site: Secondary | ICD-10-CM | POA: Diagnosis not present

## 2017-09-06 DIAGNOSIS — M5030 Other cervical disc degeneration, unspecified cervical region: Secondary | ICD-10-CM | POA: Diagnosis not present

## 2017-09-06 DIAGNOSIS — M19041 Primary osteoarthritis, right hand: Secondary | ICD-10-CM | POA: Diagnosis not present

## 2017-09-06 DIAGNOSIS — R768 Other specified abnormal immunological findings in serum: Secondary | ICD-10-CM

## 2017-09-06 MED ORDER — TRIAMCINOLONE ACETONIDE 40 MG/ML IJ SUSP
10.0000 mg | INTRAMUSCULAR | Status: AC | PRN
  Administered 2017-09-06: 10 mg via INTRAMUSCULAR

## 2017-09-06 MED ORDER — LIDOCAINE HCL 1 % IJ SOLN
0.5000 mL | INTRAMUSCULAR | Status: AC | PRN
  Administered 2017-09-06: .5 mL

## 2017-09-06 MED ORDER — DICLOFENAC SODIUM 1 % TD GEL: TRANSDERMAL | 3 refills | Status: DC

## 2017-09-09 ENCOUNTER — Telehealth: Payer: Self-pay | Admitting: Rheumatology

## 2017-09-09 ENCOUNTER — Ambulatory Visit
Admission: RE | Admit: 2017-09-09 | Discharge: 2017-09-09 | Disposition: A | Discharge status: Home / Self Care | Payer: BLUE CROSS/BLUE SHIELD | Source: Ambulatory Visit | Attending: Hematology and Oncology | Admitting: Hematology and Oncology

## 2017-09-09 DIAGNOSIS — Z9889 Other specified postprocedural states: Secondary | ICD-10-CM

## 2017-09-09 DIAGNOSIS — R928 Other abnormal and inconclusive findings on diagnostic imaging of breast: Secondary | ICD-10-CM | POA: Diagnosis not present

## 2017-09-09 NOTE — Telephone Encounter (Signed)
Patient called to get her labwork results.  She would also like to pick up a written report of her results.  Patient states she was in a lot of pain over the weekend and is hoping that the results will give her some answers.

## 2017-09-09 NOTE — Telephone Encounter (Signed)
Returned patient's call and advised patient that all of the labs have not resulted yet. Advised patient we will call with results once they have all results.

## 2017-09-10 ENCOUNTER — Encounter: Payer: Self-pay | Admitting: Family Medicine

## 2017-09-10 ENCOUNTER — Ambulatory Visit (INDEPENDENT_AMBULATORY_CARE_PROVIDER_SITE_OTHER): Payer: BLUE CROSS/BLUE SHIELD | Admitting: Family Medicine

## 2017-09-10 ENCOUNTER — Telehealth: Payer: Self-pay | Admitting: Rheumatology

## 2017-09-10 VITALS — BP 100/80 | HR 85 | Temp 98.9°F | Ht 64.0 in | Wt 185.0 lb

## 2017-09-10 DIAGNOSIS — R51 Headache: Secondary | ICD-10-CM | POA: Diagnosis not present

## 2017-09-10 DIAGNOSIS — L930 Discoid lupus erythematosus: Secondary | ICD-10-CM | POA: Diagnosis not present

## 2017-09-10 DIAGNOSIS — R519 Headache, unspecified: Secondary | ICD-10-CM

## 2017-09-10 DIAGNOSIS — M797 Fibromyalgia: Secondary | ICD-10-CM | POA: Diagnosis not present

## 2017-09-10 DIAGNOSIS — J0100 Acute maxillary sinusitis, unspecified: Secondary | ICD-10-CM

## 2017-09-10 LAB — SEDIMENTATION RATE: Sed Rate: 14 mm/h (ref 0–30)

## 2017-09-10 LAB — URINALYSIS, ROUTINE W REFLEX MICROSCOPIC
BILIRUBIN URINE: NEGATIVE
GLUCOSE, UA: NEGATIVE
Hgb urine dipstick: NEGATIVE
Leukocytes, UA: NEGATIVE
Nitrite: NEGATIVE
Protein, ur: NEGATIVE
SPECIFIC GRAVITY, URINE: 1.023 (ref 1.001–1.03)
pH: 5.5 (ref 5.0–8.0)

## 2017-09-10 LAB — CBC WITH DIFFERENTIAL/PLATELET
BASOS ABS: 22 {cells}/uL (ref 0–200)
Basophils Relative: 0.6 %
EOS ABS: 22 {cells}/uL (ref 15–500)
Eosinophils Relative: 0.6 %
HEMATOCRIT: 37.3 % (ref 35.0–45.0)
HEMOGLOBIN: 12.6 g/dL (ref 11.7–15.5)
LYMPHS ABS: 1836 {cells}/uL (ref 850–3900)
MCH: 30 pg (ref 27.0–33.0)
MCHC: 33.8 g/dL (ref 32.0–36.0)
MCV: 88.8 fL (ref 80.0–100.0)
MPV: 9.5 fL (ref 7.5–12.5)
Monocytes Relative: 5.9 %
NEUTROS ABS: 1508 {cells}/uL (ref 1500–7800)
Neutrophils Relative %: 41.9 %
Platelets: 237 10*3/uL (ref 140–400)
RBC: 4.2 10*6/uL (ref 3.80–5.10)
RDW: 12.9 % (ref 11.0–15.0)
Total Lymphocyte: 51 %
WBC: 3.6 10*3/uL — ABNORMAL LOW (ref 3.8–10.8)
WBCMIX: 212 {cells}/uL (ref 200–950)

## 2017-09-10 LAB — COMPLETE METABOLIC PANEL WITH GFR
AG Ratio: 1.3 (calc) (ref 1.0–2.5)
ALBUMIN MSPROF: 4.3 g/dL (ref 3.6–5.1)
ALKALINE PHOSPHATASE (APISO): 47 U/L (ref 33–130)
ALT: 18 U/L (ref 6–29)
AST: 22 U/L (ref 10–35)
BILIRUBIN TOTAL: 0.6 mg/dL (ref 0.2–1.2)
BUN: 14 mg/dL (ref 7–25)
CO2: 28 mmol/L (ref 20–32)
CREATININE: 0.87 mg/dL (ref 0.50–1.05)
Calcium: 9.7 mg/dL (ref 8.6–10.4)
Chloride: 104 mmol/L (ref 98–110)
GFR, Est African American: 88 mL/min/{1.73_m2} (ref >=60)
GFR, Est Non African American: 76 mL/min/{1.73_m2} (ref >=60)
GLUCOSE: 90 mg/dL (ref 65–99)
Globulin: 3.2 g/dL (calc) (ref 1.9–3.7)
Potassium: 3.7 mmol/L (ref 3.5–5.3)
Sodium: 141 mmol/L (ref 135–146)
TOTAL PROTEIN: 7.5 g/dL (ref 6.1–8.1)

## 2017-09-10 LAB — ANA: Anti Nuclear Antibody(ANA): POSITIVE — AB

## 2017-09-10 LAB — C3 AND C4
C3 COMPLEMENT: 156 mg/dL (ref 83–193)
C4 COMPLEMENT: 22 mg/dL (ref 15–57)

## 2017-09-10 LAB — 14-3-3 ETA PROTEIN: 14-3-3 eta Protein: <0.2 ng/mL (ref <0.2)

## 2017-09-10 LAB — ANTI-NUCLEAR AB-TITER (ANA TITER)

## 2017-09-10 LAB — VITAMIN D 25 HYDROXY (VIT D DEFICIENCY, FRACTURES): Vit D, 25-Hydroxy: 40 ng/mL (ref 30–100)

## 2017-09-10 LAB — ANTI-DNA ANTIBODY, DOUBLE-STRANDED: DS DNA AB: 4 [IU]/mL

## 2017-09-10 LAB — CYCLIC CITRUL PEPTIDE ANTIBODY, IGG: CYCLIC CITRULLIN PEPTIDE AB: 97 U — AB

## 2017-09-10 MED ORDER — DOXYCYCLINE HYCLATE 100 MG PO TABS: 100.0000 mg | ORAL_TABLET | Freq: Two times a day (BID) | ORAL | 0 refills | Status: DC

## 2017-09-10 MED ORDER — PREDNISONE 20 MG PO TABS: 20.0000 mg | ORAL_TABLET | Freq: Every day | ORAL | 0 refills | Status: DC

## 2017-09-10 NOTE — Telephone Encounter (Signed)
Spoke with patient and advised that all of her labs have not been received. Will call once all of her labs are back. Patient advised that if she is in that much pain she needs to seek out her PCP or the emergency room. Patient verbalized understanding.

## 2017-09-10 NOTE — Progress Notes (Signed)
HPI: Acute visit for left-sided facial pain - maxillary: -This started about a week ago -She also has pain in the left trapezius muscle, left facial muscles per her report and left paraspinal muscles in her neck -reports she is seen had this a number of times in the past and the prednisone is the only thing that thinks is it -She sees a rheumatologist, neurologist and an orthopedic for a variety of musculoskeletal and rheumatological issues including lupus -Reports she saw her rheumatologist last week and they did a trigger point injection in the trapezius which did help that, but she feels she needs prednisone to help with the rest of this -He does have some nasal congestion, but has not had fevers, syncope, vision or hearing loss, shortness of breath, flulike symptoms, nausea or vomiting -She reports she cannot take NSAIDs -has tried topical Voltaren which has not helped -He also has tried her muscle relaxer that she uses for her fibromyalgia and it has not helped -Called her rheumatologist back and they recommended coming here  ROS: See pertinent positives and negatives per HPI.  Past Medical History:  Diagnosis Date  . Anxiety   . Breast cancer ([place]) 2015   lumpectomy  . Cancer of right breast ([place]) 09/03/13   Invasive Ductal Carcinoma/Ductal Carcinoma Insitu  biopsies  . Chronic low back pain 01/24/2016   -and R hip pain -seen by rheumatologist and ortho in the past -MRI with Dr. Lorin Mercy in 2015, R hip plain films in 2015 as well acupuncture, injections, tramadol not helpful trigger therapy [facility] and PT helpful   . Degenerative joint disease    back neck and shoulder  . Fibromyalgia    Dr. Estanislado Pandy  . GERD (gastroesophageal reflux disease)    hx esophagitis  . Hot flashes   . Hypertension   . Leukopenia 06/16/2013  . Lupus    rheumatologist - Dr. Estanislado Pandy  . Migraines   . Neuromuscular disorder ([place])    neuropathy from chemo in feet/hands  . Personal history of  chemotherapy    2015  . Personal history of radiation therapy 2015  . S/P radiation therapy 04/27/2014-06/07/2014   1) Right breast, 50 Gy in 25 fractions/ 2) Right breast boost, 10 Gy in 5 fractions    Past Surgical History:  Procedure Laterality Date  . BREAST BIOPSY Left 2015   benign core  . BREAST BIOPSY Right 2015   malignant core   . BREAST LUMPECTOMY Right   . BREAST SURGERY  03/01/2014   hx right breast cancer- 3 lymph nodes removed and lumpectomy  . [place]   rt  . CESAREAN SECTION     x 3  . PORTACATH PLACEMENT  08/2013   still currently active   . ROBOTIC ASSISTED BILATERAL SALPINGO OOPHERECTOMY Bilateral 07/09/2014   Procedure: ROBOTIC ASSISTED BILATERAL SALPINGO OOPHORECTOM; :LYSIS OF ADHESIONS;  Surgeon: Marvene Staff, MD;  Location: [facility];  Service: Gynecology;  Laterality: Bilateral;  . TRIGGER FINGER RELEASE  2004   rt thumb  . trigger finger release left thumb  06/28/14   at [facility]  . TUBAL LIGATION    . WISDOM TOOTH EXTRACTION      Family History  Problem Relation Age of Onset  . Hypertension Mother   . Dementia Mother        small vessel disease  . Cancer Mother 30       cervical  cancer  . Cancer Father 36  prostate ca  . Diabetes Father   . Arthritis Father   . Breast cancer Neg Hx     Social History   Socioeconomic History  . Marital status: Married    Spouse name: None  . Number of children: 1  . Years of education: None  . Highest education level: None  Social Needs  . Financial resource strain: None  . Food insecurity - worry: None  . Food insecurity - inability: None  . Transportation needs - medical: None  . Transportation needs - non-medical: None  Occupational History    Comment: homemaker  Tobacco Use  . Smoking status: Former Smoker    Packs/day: 1.00    Years: 15.00    Pack years: 15.00    Types: Cigars, Cigarettes    Last attempt to quit: 02/23/2004    Years since  quitting: 13.5  . Smokeless tobacco: Never Used  . Tobacco comment: remote smoking history  Substance and Sexual Activity  . Alcohol use: Yes    Alcohol/week: 0.0 oz    Comment: rare  . Drug use: Yes    Comment: Marjuana  . Sexual activity: Yes  Other Topics Concern  . None  Social History Narrative   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      Home Situation: lives with husband and 21 yo son      Spiritual Beliefs: Christian      Lifestyle: elliptical 3-4 times per week; working on diet as well over last year in 2014              Current Outpatient Medications:  .  aspirin 81 MG tablet, Take 81 mg by mouth daily., Disp: , Rfl:  .  b complex vitamins capsule, Take 1 capsule by mouth daily., Disp: , Rfl:  .  BIOTIN 5000 PO, Take 1 capsule by mouth daily. , Disp: , Rfl:  .  Calcium Carbonate-Vit D-Min (CALTRATE 600+D PLUS MINERALS PO), Take 1 tablet by mouth daily., Disp: , Rfl:  .  diclofenac sodium (VOLTAREN) 1 % GEL, Apply 3 grams to 3 large joint up to 3 times daily, Disp: 3 Tube, Rfl: 3 .  Docusate Calcium (STOOL SOFTENER PO), Take by mouth as needed., Disp: , Rfl:  .  gabapentin (NEURONTIN) 300 MG capsule, Take 1 capsule (300 mg total) by mouth 3 (three) times daily., Disp: 270 capsule, Rfl: 3 .  ibuprofen (ADVIL,MOTRIN) 800 MG tablet, Take 1 tablet (800 mg total) by mouth every 8 (eight) hours as needed., Disp: 21 tablet, Rfl: 0 .  lisinopril-hydrochlorothiazide (PRINZIDE,ZESTORETIC) 20-12.5 MG tablet, TAKE 1 TABLET DAILY (NEED AN APPOINTMENT), Disp: 90 tablet, Rfl: 0 .  loratadine (CLARITIN) 10 MG tablet, Take 10 mg by mouth daily., Disp: , Rfl:  .  MAGNESIUM PO, Take by mouth daily., Disp: , Rfl:  .  omeprazole (PRILOSEC) 40 MG capsule, Take 1 capsule (40 mg total) by mouth daily., Disp: 90 capsule, Rfl: 1 .  Polyethylene Glycol 3350 (MIRALAX PO), Take by mouth as needed., Disp: , Rfl:  .  SUMAtriptan (IMITREX) 25 MG tablet, Take 1 tablet (25 mg total) by mouth every 2 (two)  hours as needed for migraine. May repeat in 2 hours if needed. Do not take more then 2 tablets in 24 hours., Disp: 10 tablet, Rfl: 0 .  tiZANidine (ZANAFLEX) 2 MG tablet, Take 1 tablet (2 mg total) every 8 (eight) hours as needed by mouth for muscle spasms., Disp: 30 tablet, Rfl: 0 .  TURMERIC PO,  Take by mouth daily., Disp: , Rfl:  .  venlafaxine XR (EFFEXOR-XR) 75 MG 24 hr capsule, TAKE 1 CAPSULE DAILY WITH BREAKFAST, Disp: 90 capsule, Rfl: 1 .  VITAMIN E EX, Apply topically. Vaginal suppository, Disp: , Rfl:  .  doxycycline (VIBRA-TABS) 100 MG tablet, Take 1 tablet (100 mg total) by mouth 2 (two) times daily., Disp: 14 tablet, Rfl: 0 .  predniSONE (DELTASONE) 20 MG tablet, Take 1 tablet (20 mg total) by mouth daily with breakfast. 20mg  daily, Disp: 5 tablet, Rfl: 0 No current facility-administered medications for this visit.   Facility-Administered Medications Ordered in Other Visits:  .  prochlorperazine (COMPAZINE) injection 10 mg, 10 mg, Intravenous, Q6H PRN, Gardenia Phlegm, NP, 10 mg at 12/26/13 0917  EXAM:  Vitals:   09/10/17 1626  BP: 100/80  Pulse: 85  Temp: 98.9 F (37.2 C)    Body mass index is 31.76 kg/m.  GENERAL: vitals reviewed and listed above, alert, oriented, appears well hydrated and in no acute distress  HEENT: atraumatic, conjunttiva clear, no obvious abnormalities on inspection of external nose and ears, normal appearance of ear canals and TMs, thick white nasal congestion on L, mild post oropharyngeal erythema with PND, no tonsillar edema or exudate, Max sinus TTP, no temp art TTP  NECK: no obvious masses on inspection, TTP in trap muscle region, no LAD or TTP over vessels, normal ROM head and neck, TTP sub occ muscles  LUNGS: clear to auscultation bilaterally, no wheezes, rales or rhonchi, good air movement  CV: HRRR, no peripheral edema  MS: moves all extremities without noticeable abnormality  PSYCH: CN II-XII grossly intact, speech and  thought processing grossly intact, pleasant and cooperative, no obvious depression or anxiety  ASSESSMENT AND PLAN:  Discussed the following assessment and plan:  Acute non-recurrent maxillary sinusitis  Fibromyalgia  Lupus erythematosus, unspecified form  Facial pain  -we discussed possible serious and likely etiologies, workup and treatment, treatment risks and return precautions -could be multifactorial as her exam suggest a sinus infection -after this discussion, Giah opted for antibiotic for the sinus infection, prednisone as she feels she has had the same symptoms in the past with resolution with prednisone, discuss risks of the medications, discussed various options symptomatic care at home, also advised follow-up with her neurologist as she has a history of migraines and cervicogenic headaches -which also could potentially be the cause of her symptoms -Advised if worsening or not improving over the next few days that she seek emergency care, and may need some imaging causes, less likely given she reports extensive evaluation in the past for similar symptoms -follow up advised 1 month -of course, we advised Milady  to return or notify a doctor immediately if symptoms worsen or persist or new concerns arise.    Patient Instructions  BEFORE YOU LEAVE: -follow up: 1 month  Take the antibiotic for the sinus infection  Prednisone per prescription, but notify your rheumatologist you are taking this  Heat, tiger balm (topical menthol), muscle relaxer or tylenol per instructions as needed for pain.   Call right away to schedule appointment with your neurologist about the head pain.  Seek emergency care if worsening or severe pain or not improving with treatment.    [name], DO

## 2017-09-10 NOTE — Patient Instructions (Signed)
BEFORE YOU LEAVE: -follow up: 1 month  Take the antibiotic for the sinus infection  Prednisone per prescription, but notify your rheumatologist you are taking this  Heat, tiger balm (topical menthol), muscle relaxer or tylenol per instructions as needed for pain.   Call right away to schedule appointment with your neurologist about the head pain.  Seek emergency care if worsening or severe pain or not improving with treatment.

## 2017-09-10 NOTE — Telephone Encounter (Signed)
Patient calling for lab results, and request a call back ASAP. Patient is having a lot of pain. Patient was up most of the night due to pain, and would like something to help with the pain.

## 2017-09-11 NOTE — Progress Notes (Signed)
WBC count improved.   ANA titer unchanged. CCP positive. All other autoimmune labs are WNL.   She has trace amounts of ketones in her urine.  Please advise her to follow up with PCP.

## 2017-09-12 ENCOUNTER — Telehealth: Payer: Self-pay | Admitting: *Deleted

## 2017-09-12 DIAGNOSIS — R8290 Unspecified abnormal findings in urine: Secondary | ICD-10-CM

## 2017-09-12 NOTE — Telephone Encounter (Signed)
Okay.  This can be seen in many different cases.  Ensure she is drinking plenty of water.  Okay to order the requested UA in 2 months.  We can set up a lab visit for this if does not have a visit at that time.

## 2017-09-12 NOTE — Addendum Note (Signed)
Addended by: Agnes Lawrence on: 09/12/2017 11:23 AM   Modules accepted: Orders

## 2017-09-12 NOTE — Telephone Encounter (Signed)
I called the pt and she stated Tina [name] office called her and stated there was traces of ketones in her urine and they recommended she follow up with Tina [name] for repeat in approximately 2 months.  Message sent to Tina [name]. Tina [name]      Copied from [place]. Topic: General - Other >> Sep 11, 2017 12:16 PM Tina [name] wrote: Reason for CRM: Would like a call back from Tina [name] cma RE test results she was told to report to Tina. [name].

## 2017-09-12 NOTE — Telephone Encounter (Signed)
I called the pt and informed her of the message below.  Lab appt scheduled for 5/3 per the pts request.

## 2017-11-08 ENCOUNTER — Ambulatory Visit (INDEPENDENT_AMBULATORY_CARE_PROVIDER_SITE_OTHER): Payer: BLUE CROSS/BLUE SHIELD | Admitting: Neurology

## 2017-11-08 ENCOUNTER — Other Ambulatory Visit: Payer: Self-pay

## 2017-11-08 VITALS — BP 138/80 | HR 92 | Ht 60.0 in | Wt 187.0 lb

## 2017-11-08 DIAGNOSIS — R51 Headache: Secondary | ICD-10-CM

## 2017-11-08 DIAGNOSIS — G4486 Cervicogenic headache: Secondary | ICD-10-CM

## 2017-11-08 DIAGNOSIS — G5602 Carpal tunnel syndrome, left upper limb: Secondary | ICD-10-CM | POA: Diagnosis not present

## 2017-11-08 MED ORDER — METHOCARBAMOL 500 MG PO TABS: ORAL_TABLET | ORAL | 11 refills | Status: DC

## 2017-11-08 MED ORDER — GABAPENTIN 300 MG PO CAPS: 300.0000 mg | ORAL_CAPSULE | Freq: Three times a day (TID) | ORAL | 3 refills | Status: DC

## 2017-11-08 NOTE — Patient Instructions (Signed)
1. Continue gabapentin 300mg  three times a day 2. Try a different muscle relaxant Robaxin 500mg : Take 1 cap at bedtime. May take up to three times a day as needed for muscle spasm 3. If left hand symptoms worsen, call our office and we will send referral to Hand Surgery 4. Follow-up in 1 year, call for any changes

## 2017-11-08 NOTE — Progress Notes (Signed)
NEUROLOGY FOLLOW UP OFFICE NOTE  Tina [name] [id number]  DOB: 04-May-1961  HISTORY OF PRESENT ILLNESS: I had the pleasure of seeing Tina [name] in follow-up in the neurology clinic on 11/13/2017.  The patient was last seen 1 year ago after she presented with new onset headaches in 2016 localized over the right hemisphere, suggestive of cervicogenic headaches. She had increased gabapentin back to 300mg  TID and was referred for physical therapy and has had excellent response. She continues to do well with no severe headaches. She occasionally feels lightheaded and feels like there is fluid in her ears. She has tried tapering off gabapentin, but the muscles on the left side of her neck would start getting tense. She was reporting tingling on the left arm with subjective hand weakness, EMG/NCV had shown left carpal tunnel syndrome, moderate to severe. She states it has not been so bad recently, her left hand is numb on and off, no weakness. She has a history of right carpal tunnel surgery. She denies any vision changes, no falls.   HPI 02/10/2015: This is a pleasant 57 yo RH woman with a history of breast cancer s/p radiation and chemotherapy, hypertension, polyarthralgia, presenting for evaluation of new onset headaches. She states she has had tension or sinus headaches in the past, but a couple of months ago started having a different kind of headache with pain on the right hemisphere radiating down the right side of her face and down her neck. She sometimes feels the gums on her right side feel sore. She describes a pressure-like pain, with occasional brief sharp, stabbing pains. There is occasional tingling in her scalp on the right side. Headache intensity can go up to a 10/10 where she would have nausea and vomiting, one time it appears she got cold and clammy and possibly briefly passed out. Headaches occur every 2 weeks or so, but would last for several days. She reports that she had a headache  daily for 2 weeks until last week. She was feeling better for a week, then again has a mild headache today. This is associated with neck pain, and she reports that shoulder and neck pain have been bothering her on and off for years, but this is the worst it has been. She has seen her rheumatologist and told she may have fibromyalgia.  She denies any vision changes, no visual obscurations, conjunctival injection. She denies any diplopia, dysarthria, dysphagia, focal weakness, bowel/bladder incontinence. She has been told she has fluid in the right ear, and has occasional high-pitched tinnitus. She has intermittent numbness in her left arm on a daily basis. She has chronic back pain and states Flexeril did not help, but she would use marijuana and this relaxes her and relieves the pain. She had been taking gabapentin 300mg  TID for a year for neuropathy secondary to chemo, and had self-reduced it a couple of months ago to 100mg  TID. She reports taking prn Ativan for nausea. She reports sleep is fine, she had a sleep study negative for sleep apnea. She denies any history of head injuries or recent infections, no family history of headaches.   I personally reviewed MRI brain with and without contrast which was normal, no abnormal enhancement seen. Her MRI C-spine showed degenerative change from C3-4 through C7-T1. At C4-5, there is a central disc herniation that contacts the ventral aspect of the cord but does not frankly compress or show any cord edema. There is mild foraminal narrowing on the left at C5-6 and  bilaterally at C6-7, but no definite compression of the exiting nerve roots. C7-T1 shows a shallow broad-based disc herniation but without visible nerve compression.  PAST MEDICAL HISTORY: Past Medical History:  Diagnosis Date  . Anxiety   . Breast cancer ([place]) 2015   lumpectomy  . Cancer of right breast ([place]) 09/03/13   Invasive Ductal Carcinoma/Ductal Carcinoma Insitu  biopsies  . Chronic low back  pain 01/24/2016   -and R hip pain -seen by rheumatologist and ortho in the past -MRI with Dr. Lorin Mercy in 2015, R hip plain films in 2015 as well acupuncture, injections, tramadol not helpful [facility] and PT helpful   . Degenerative joint disease    back neck and shoulder  . Fibromyalgia    Dr. Estanislado Pandy  . GERD (gastroesophageal reflux disease)    hx esophagitis  . Hot flashes   . Hypertension   . Leukopenia 06/16/2013  . Lupus    rheumatologist - Dr. Estanislado Pandy  . Migraines   . 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 Personal history of chemotherapy    2015  . Personal history of radiation therapy 2015  . S/P radiation therapy 04/27/2014-06/07/2014   1) Right breast, 50 Gy in 25 fractions/ 2) Right breast boost, 10 Gy in 5 fractions    MEDICATIONS: Current Outpatient Medications on File Prior to Visit  Medication Sig Dispense Refill  . aspirin 81 MG tablet Take 81 mg by mouth daily.    Marland Kitchen b complex vitamins capsule Take 1 capsule by mouth daily.    Marland Kitchen BIOTIN 5000 PO Take 1 capsule by mouth daily.     . Calcium Carbonate-Vit D-Min (CALTRATE 600+D PLUS MINERALS PO) Take 1 tablet by mouth daily.    . diclofenac sodium (VOLTAREN) 1 % GEL Apply 3 grams to 3 large joint up to 3 times daily 3 Tube 3  . Docusate Calcium (STOOL SOFTENER PO) Take by mouth as needed.    . doxycycline (VIBRA-TABS) 100 MG tablet Take 1 tablet (100 mg total) by mouth 2 (two) times daily. 14 tablet 0  . gabapentin (NEURONTIN) 300 MG capsule Take 1 capsule (300 mg total) by mouth 3 (three) times daily. 270 capsule 3  . ibuprofen (ADVIL,MOTRIN) 800 MG tablet Take 1 tablet (800 mg total) by mouth every 8 (eight) hours as needed. 21 tablet 0  . lisinopril-hydrochlorothiazide (PRINZIDE,ZESTORETIC) 20-12.5 MG tablet TAKE 1 TABLET DAILY (NEED AN APPOINTMENT) 90 tablet 0  . loratadine (CLARITIN) 10 MG tablet Take 10 mg by mouth daily.    Marland Kitchen MAGNESIUM PO Take by mouth  daily.    Marland Kitchen omeprazole (PRILOSEC) 40 MG capsule Take 1 capsule (40 mg total) by mouth daily. 90 capsule 1  . Polyethylene Glycol 3350 (MIRALAX PO) Take by mouth as needed.    . predniSONE (DELTASONE) 20 MG tablet Take 1 tablet (20 mg total) by mouth daily with breakfast. 20mg  daily 5 tablet 0  . SUMAtriptan (IMITREX) 25 MG tablet Take 1 tablet (25 mg total) by mouth every 2 (two) hours as needed for migraine. May repeat in 2 hours if needed. Do not take more then 2 tablets in 24 hours. 10 tablet 0  . tiZANidine (ZANAFLEX) 2 MG tablet Take 1 tablet (2 mg total) every 8 (eight) hours as needed by mouth for muscle spasms. 30 tablet 0  . TURMERIC PO Take by mouth daily.    Marland Kitchen venlafaxine XR (EFFEXOR-XR) 75 MG 24 hr capsule TAKE  1 CAPSULE DAILY WITH BREAKFAST 90 capsule 1  . VITAMIN E EX Apply topically. Vaginal suppository     Current Facility-Administered Medications on File Prior to Visit  Medication Dose Route Frequency Provider Last Rate Last Dose  . prochlorperazine (COMPAZINE) injection 10 mg  10 mg Intravenous Q6H PRN Gardenia Phlegm, NP   10 mg at 12/26/13 5400    ALLERGIES: Allergies  Allergen Reactions  . Adhesive [Tape] Itching    Burned and scarred skinned  . Other     Cholroprep-causes rash  . Nickel Rash    FAMILY HISTORY: Family History  Problem Relation Age of Onset  . Hypertension Mother   . Dementia Mother        small vessel disease  . Cancer Mother 30       cervical  cancer  . Cancer Father 64       prostate ca  . Diabetes Father   . Arthritis Father   . Breast cancer Neg Hx     SOCIAL HISTORY: Social History   Socioeconomic History  . Marital status: Married    Spouse name: Not on file  . Number of children: 1  . Years of education: Not on file  . Highest education level: Not on file  Occupational History    Comment: homemaker  Social Needs  . Financial resource strain: Not on file  . Food insecurity:    Worry: Not on file    Inability:  Not on file  . Transportation needs:    Medical: Not on file    Non-medical: Not on file  Tobacco Use  . Smoking status: Former Smoker    Packs/day: 1.00    Years: 15.00    Pack years: 15.00    Types: Cigars, Cigarettes    Last attempt to quit: 02/23/2004    Years since quitting: 13.7  . Smokeless tobacco: Never Used  . Tobacco comment: remote smoking history  Substance and Sexual Activity  . Alcohol use: Yes    Alcohol/week: 0.0 oz    Comment: rare  . Drug use: Yes    Comment: Marjuana  . Sexual activity: Yes  Lifestyle  . Physical activity:    Days per week: Not on file    Minutes per session: Not on file  . Stress: Not on file  Relationships  . Social connections:    Talks on phone: Not on file    Gets together: Not on file    Attends religious service: Not on file    Active member of club or organization: Not on file    Attends meetings of clubs or organizations: Not on file    Relationship status: Not on file  . Intimate partner violence:    Fear of current or ex partner: Not on file    Emotionally abused: Not on file    Physically abused: Not on file    Forced sexual activity: Not on file  Other Topics Concern  . Not on file  Social History Narrative   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      Home Situation: lives with husband and 69 yo son      Spiritual Beliefs: Christian      Lifestyle: elliptical 3-4 times per week; working on diet as well over last year in 2014             REVIEW OF SYSTEMS: Constitutional: No fevers, chills, or sweats, no generalized fatigue, change in appetite Eyes: No visual changes,  double vision, eye pain Ear, nose and throat: No hearing loss, ear pain, nasal congestion, sore throat Cardiovascular: No chest pain, palpitations Respiratory:  No shortness of breath at rest or with exertion, wheezes GastrointestinaI: No nausea, vomiting, diarrhea, abdominal pain, fecal incontinence Genitourinary:  No dysuria, urinary retention or  frequency Musculoskeletal:  + neck pain, back pain Integumentary: No rash, pruritus, skin lesions Neurological: as above Psychiatric: No depression, insomnia, anxiety Endocrine: No palpitations, fatigue, diaphoresis, mood swings, change in appetite, change in weight, increased thirst Hematologic/Lymphatic:  No anemia, purpura, petechiae. Allergic/Immunologic: no itchy/runny eyes, nasal congestion, recent allergic reactions, rashes  PHYSICAL EXAM: Vitals:   11/08/17 1522  BP: 138/80  Pulse: 92  SpO2: 98%   General: No acute distress Head:  Normocephalic/atraumatic Neck: supple, no paraspinal tenderness, full range of motion Heart:  Regular rate and rhythm Lungs:  Clear to auscultation bilaterally Back: No paraspinal tenderness Skin/Extremities: No rash, no edema Neurological Exam: alert and oriented to person, place, and time. No aphasia or dysarthria. Fund of knowledge is appropriate.  Recent and remote memory are intact.  Attention and concentration are normal.    Able to name objects and repeat phrases. Cranial nerves: Pupils equal, round, reactive to light. Extraocular movements intact with no nystagmus. Visual fields full. Facial sensation intact. No facial asymmetry. Tongue, uvula, palate midline.  Motor: Bulk and tone normal, muscle strength 5/5 throughout with no pronator drift, no hand weakness today. Sensation to light touch, pin, cold intact.  No extinction to double simultaneous stimulation.  Deep tendon reflexes 2+ throughout, toes downgoing.  Finger to nose testing intact.  Gait narrow-based and steady, able to tandem walk adequately.  Romberg negative. Negative Tinel and Phalen signs.  IMPRESSION: This is a pleasant 57 yo RH woman with a history of breast cancer s/p radiation and chemotherapy, hypertension, polyarthralgia, who presented with new onset headaches in 2016 that localized over the right side, with significant neck pain, suggestive of cervicogenic headaches. Her  neurological exam and MRI brain were normal. She has had excellent response to PT and gabapentin 300mg  TID with no side effects. She continues to do well with no significant headaches. Neck pain worsens with gabapentin taper, continue Gabapentin 300mg  TID and home neck exercises. She will try a different muscle relaxant, Robaxin for neck pain, side effects were discussed. She had left hand numbness a year ago, EMG showed left carpal tunnel syndrome, moderate to severe. She reports symptoms are not as bothersome, continue to monitor, if symptoms worsen, referral to Hand Surgery will be done. She will follow-up in 1 year and knows to call our office for any changes.  Thank you for allowing me to participate in her care.  Please do not hesitate to call for any questions or concerns.  The duration of this appointment visit was 30 minutes of face-to-face time with the patient.  Greater than 50% of this time was spent in counseling, explanation of diagnosis, planning of further management, and coordination of care.   Ellouise Newer, M.D.   CC: Dr. Maudie Mercury

## 2017-11-13 ENCOUNTER — Encounter: Payer: Self-pay | Admitting: Neurology

## 2017-11-24 ENCOUNTER — Other Ambulatory Visit: Payer: Self-pay | Admitting: Family Medicine

## 2017-11-29 ENCOUNTER — Other Ambulatory Visit: Payer: BLUE CROSS/BLUE SHIELD

## 2017-12-02 ENCOUNTER — Other Ambulatory Visit (INDEPENDENT_AMBULATORY_CARE_PROVIDER_SITE_OTHER): Payer: BLUE CROSS/BLUE SHIELD

## 2017-12-02 DIAGNOSIS — R8290 Unspecified abnormal findings in urine: Secondary | ICD-10-CM | POA: Diagnosis not present

## 2017-12-02 LAB — POC URINALSYSI DIPSTICK (AUTOMATED)
Bilirubin, UA: NEGATIVE
Blood, UA: NEGATIVE
GLUCOSE UA: NEGATIVE
Ketones, UA: NEGATIVE
LEUKOCYTES UA: NEGATIVE
NITRITE UA: NEGATIVE
PH UA: 6 (ref 5.0–8.0)
Protein, UA: NEGATIVE
Spec Grav, UA: 1.025 (ref 1.010–1.025)
UROBILINOGEN UA: 0.2 U/dL

## 2017-12-03 ENCOUNTER — Other Ambulatory Visit: Payer: Self-pay | Admitting: Family Medicine

## 2017-12-19 ENCOUNTER — Ambulatory Visit (INDEPENDENT_AMBULATORY_CARE_PROVIDER_SITE_OTHER): Payer: BLUE CROSS/BLUE SHIELD | Admitting: Rheumatology

## 2017-12-19 ENCOUNTER — Ambulatory Visit: Payer: BLUE CROSS/BLUE SHIELD | Admitting: Podiatry

## 2017-12-19 ENCOUNTER — Encounter: Payer: Self-pay | Admitting: Rheumatology

## 2017-12-19 VITALS — BP 114/81 | HR 76 | Resp 13 | Ht 64.0 in | Wt 185.5 lb

## 2017-12-19 DIAGNOSIS — K219 Gastro-esophageal reflux disease without esophagitis: Secondary | ICD-10-CM | POA: Diagnosis not present

## 2017-12-19 DIAGNOSIS — D8989 Other specified disorders involving the immune mechanism, not elsewhere classified: Secondary | ICD-10-CM

## 2017-12-19 DIAGNOSIS — G620 Drug-induced polyneuropathy: Secondary | ICD-10-CM

## 2017-12-19 DIAGNOSIS — M19041 Primary osteoarthritis, right hand: Secondary | ICD-10-CM

## 2017-12-19 DIAGNOSIS — M160 Bilateral primary osteoarthritis of hip: Secondary | ICD-10-CM | POA: Diagnosis not present

## 2017-12-19 DIAGNOSIS — M19072 Primary osteoarthritis, left ankle and foot: Secondary | ICD-10-CM

## 2017-12-19 DIAGNOSIS — M62838 Other muscle spasm: Secondary | ICD-10-CM

## 2017-12-19 DIAGNOSIS — M19071 Primary osteoarthritis, right ankle and foot: Secondary | ICD-10-CM | POA: Diagnosis not present

## 2017-12-19 DIAGNOSIS — I10 Essential (primary) hypertension: Secondary | ICD-10-CM | POA: Diagnosis not present

## 2017-12-19 DIAGNOSIS — M5030 Other cervical disc degeneration, unspecified cervical region: Secondary | ICD-10-CM | POA: Diagnosis not present

## 2017-12-19 DIAGNOSIS — M8580 Other specified disorders of bone density and structure, unspecified site: Secondary | ICD-10-CM

## 2017-12-19 DIAGNOSIS — M797 Fibromyalgia: Secondary | ICD-10-CM | POA: Diagnosis not present

## 2017-12-19 DIAGNOSIS — R768 Other specified abnormal immunological findings in serum: Secondary | ICD-10-CM

## 2017-12-19 DIAGNOSIS — R7989 Other specified abnormal findings of blood chemistry: Secondary | ICD-10-CM

## 2017-12-19 DIAGNOSIS — M359 Systemic involvement of connective tissue, unspecified: Secondary | ICD-10-CM

## 2017-12-19 DIAGNOSIS — Z170 Estrogen receptor positive status [ER+]: Secondary | ICD-10-CM

## 2017-12-19 DIAGNOSIS — M19042 Primary osteoarthritis, left hand: Secondary | ICD-10-CM

## 2017-12-19 DIAGNOSIS — T451X5A Adverse effect of antineoplastic and immunosuppressive drugs, initial encounter: Secondary | ICD-10-CM

## 2017-12-19 DIAGNOSIS — C50511 Malignant neoplasm of lower-outer quadrant of right female breast: Secondary | ICD-10-CM | POA: Diagnosis not present

## 2017-12-19 MED ORDER — TRIAMCINOLONE ACETONIDE 40 MG/ML IJ SUSP
10.0000 mg | INTRAMUSCULAR | Status: AC | PRN
  Administered 2017-12-19: 10 mg via INTRAMUSCULAR

## 2017-12-19 MED ORDER — LIDOCAINE HCL 1 % IJ SOLN
0.5000 mL | INTRAMUSCULAR | Status: AC | PRN
  Administered 2017-12-19: .5 mL

## 2017-12-19 NOTE — Progress Notes (Signed)
Office Visit Note  Patient: Tina [name]             Date of Birth: 10-Feb-1961           MRN: [id number]             PCP: [name], DO Referring: [name], DO Visit Date: 12/19/2017 Occupation: @GUAROCC @    Subjective:  Pain in multiple joints    History of Present Illness: Kaiulani Sitton is a 57 y.o. female with history of autoimmune disease, fibromyalgia, osteoarthritis, and DDD.  Patient states that she has been having trapezius muscle tenderness bilaterally.  She is also having neck pain and stiffness. She states that she is having numbness and tingling down her left arm.  She does report a history of left wrist carpal tunnel.  She states she is also having some left sided facial pain and headache for the past 3 days  She reports some lightheadedness but no blurry vision or nausea.  She reports she has a neurologist she sees on a regular basis.    She reports she is having tenderness at the right achilles tendon insertion site.  She states the pain is most severe when she is going up and down stairs.  She has some discomfort in bilateral knees when climbing stairs. She denies any sores in mouth or nose, no rashes, no swollen lymph nodes, no symptoms of Raynaud's.  She continues to have sicca symptoms.     Activities of Daily Living:  Patient reports morning stiffness for all day.  Patient Reports nocturnal pain.  Difficulty dressing/grooming: Denies Difficulty climbing stairs: Reports Difficulty getting out of chair: Denies Difficulty using hands for taps, buttons, cutlery, and/or writing: Denies   Review of Systems  Constitutional: Positive for fatigue.  HENT: Positive for mouth dryness. Negative for mouth sores and nose dryness.   Eyes: Positive for dryness. Negative for pain and visual disturbance.  Respiratory: Negative for cough, hemoptysis, shortness of breath and difficulty breathing.   Cardiovascular: Negative for chest pain, palpitations, hypertension and  swelling in legs/feet.  Gastrointestinal: Positive for constipation. Negative for blood in stool and diarrhea.  Endocrine: Negative for increased urination.  Genitourinary: Negative for painful urination and pelvic pain.  Musculoskeletal: Positive for arthralgias, joint pain, joint swelling and morning stiffness. Negative for myalgias, muscle weakness, muscle tenderness and myalgias.  Skin: Negative for color change, pallor, rash, hair loss, nodules/bumps, skin tightness, ulcers and sensitivity to sunlight.  Allergic/Immunologic: Negative for susceptible to infections.  Neurological: Positive for dizziness and headaches. Negative for numbness.  Hematological: Negative for bruising/bleeding tendency and swollen glands.  Psychiatric/Behavioral: Negative for depressed mood, confusion and sleep disturbance. The patient is not nervous/anxious.     PMFS History:  Patient Active Problem List   Diagnosis Date Noted  . Fibromyalgia 02/28/2016  . Chronic low back pain 01/24/2016  . Lupus ([place]) 04/19/2015  . GERD (gastroesophageal reflux disease) 04/19/2015  . Cervicogenic headache 02/10/2015  . Neck pain 02/10/2015  . Hot flashes 10/26/2014  . GAD (generalized anxiety disorder) 10/26/2014  . Peripheral neuropathy due to chemotherapy ([place]) 03/12/2014  . Breast cancer of lower-outer quadrant of right female breast ([place]) 09/15/2013  . Left shoulder pain - seeing Dr. Lorin Mercy 08/20/2013  . Hypertension     Past Medical History:  Diagnosis Date  . Anxiety   . Breast cancer ([place]) 2015   lumpectomy  . Cancer of right breast ([place]) 09/03/13   Invasive Ductal Carcinoma/Ductal Carcinoma Insitu  biopsies  .  Chronic low back pain 01/24/2016   -and R hip pain -seen by rheumatologist and ortho in the past -MRI with Dr. Lorin Mercy in 2015, R hip plain films in 2015 as well acupuncture, injections, tramadol not helpful [facility] and PT helpful   . Degenerative joint disease    back neck  and shoulder  . Fibromyalgia    Dr. Estanislado Pandy  . GERD (gastroesophageal reflux disease)    hx esophagitis  . Hot flashes   . Hypertension   . Leukopenia 06/16/2013  . Lupus [facility])    rheumatologist - Dr. Estanislado Pandy  . Migraines   . Neuromuscular disorder ([place])    neuropathy from chemo in feet/hands  . Personal history of chemotherapy    2015  . Personal history of radiation therapy 2015  . S/P radiation therapy 04/27/2014-06/07/2014   1) Right breast, 50 Gy in 25 fractions/ 2) Right breast boost, 10 Gy in 5 fractions    Family History  Problem Relation Age of Onset  . Hypertension Mother   . Dementia Mother        small vessel disease  . Cancer Mother 30       cervical  cancer  . Cancer Father 48       prostate ca  . Diabetes Father   . Arthritis Father   . Breast cancer Neg Hx    Past Surgical History:  Procedure Laterality Date  . BREAST BIOPSY Left 2015   benign core  . BREAST BIOPSY Right 2015   malignant core   . BREAST LUMPECTOMY Right   . BREAST SURGERY  03/01/2014   hx right breast cancer- 3 lymph nodes removed and lumpectomy  . [place]   rt  . CESAREAN SECTION     x 3  . PORTACATH PLACEMENT  08/2013   still currently active   . ROBOTIC ASSISTED BILATERAL SALPINGO OOPHERECTOMY Bilateral 07/09/2014   Procedure: ROBOTIC ASSISTED BILATERAL SALPINGO OOPHORECTOM; :LYSIS OF ADHESIONS;  Surgeon: Marvene Staff, MD;  Location: [facility];  Service: Gynecology;  Laterality: Bilateral;  . TRIGGER FINGER RELEASE  2004   rt thumb  . trigger finger release left thumb  06/28/14   at [facility]  . TUBAL LIGATION    . WISDOM TOOTH EXTRACTION     Social History   Social History Narrative   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      Home Situation: lives with husband and 41 yo son      Spiritual Beliefs: Christian      Lifestyle: elliptical 3-4 times per week; working on diet as well over last year in 2014               Objective: Vital Signs: BP 114/81 (BP Location: Left Arm, Patient Position: Sitting, Cuff Size: Normal)   Pulse 76   Resp 13   Ht 5\' 4"  (1.626 m)   Wt 185 lb 8 oz (84.1 kg)   LMP 09/28/2013 (Approximate)   BMI 31.84 kg/m    Physical Exam  Constitutional: She is oriented to person, place, and time. She appears well-developed and well-nourished.  HENT:  Head: Normocephalic and atraumatic.  Eyes: Conjunctivae and EOM are normal.  Neck: Normal range of motion.  Cardiovascular: Normal rate, regular rhythm, normal heart sounds and intact distal pulses.  Pulmonary/Chest: Effort normal and breath sounds normal.  Abdominal: Soft. Bowel sounds are normal.  Lymphadenopathy:    She has no cervical adenopathy.  Neurological: She is alert and oriented to person, place, and time.  Skin: Skin is warm and dry. Capillary refill takes less than 2 seconds.  Psychiatric: She has a normal mood and affect. Her behavior is normal.  Nursing note and vitals reviewed.    Musculoskeletal Exam: C-spine limited ROM with discomfort.  Thoracic and lumbar spine good ROM. Tenderness in bilateral trapezius muscle. She has no midline spinal tenderness.  Shoulder joints, elbow joints, wrist joints, MCPs, PIPs, and DIPs good ROM with no synovitis.  Hip joints, knee joints, ankle joints, MTPs, PIPs, and DIPs good ROM with no synovitis.  No warmth or effusion of knee joints.  She has tenderness at the right achilles tendon insertion site. No tenderness of trochanteric bursa bilaterally.        CDAI Exam: No CDAI exam completed.    Investigation: No additional findings.   Imaging: No results found.  Speciality Comments: No specialty comments available.    Procedures:  Trigger Point Inj Date/Time: 12/19/2017 12:28 PM Performed by: Ofilia Neas, PA-C Authorized by: Ofilia Neas, PA-C   Consent Given by:  Patient Site marked: the procedure site was marked   Timeout: prior to procedure the correct  patient, procedure, and site was verified   Indications:  Pain Total # of Trigger Points:  2 Location: neck   Needle Size:  27 G Approach:  Dorsal (Trapezius trigger point injections bilaterally) Medications #1:  0.5 mL lidocaine 1 %; 10 mg triamcinolone acetonide 40 MG/ML Medications #2:  0.5 mL lidocaine 1 %; 10 mg triamcinolone acetonide 40 MG/ML Patient tolerance:  Patient tolerated the procedure well with no immediate complications   Allergies: Adhesive [tape]; Other; and Nickel   Assessment / Plan:     Visit Diagnoses: Autoimmune disease ([place]) - + ANA 1:320 Speckled-Hx of neutropenia, fatigue, oral ulcers, sicca symptoms, parotitis, Previously on PLQ: No recent flares.  No oral or nasal ulcerations noted.  No parotid swelling or tenderness.  No cervical lymphadenopathy noted.  No photosensitivity or rashes.  She continues to have sicca symptoms.  No symptoms of Raynaud's or digital ulcerations.  She has clinically been doing well.   Fibromyalgia: She has generalized muscle aches and muscle tenderness.  She has bilateral trapezius muscle tenderness.  She requested trigger point cortisone injections.  Potential side effects were discussed.  She tolerated the procedure well.  She was encouraged to continue to work on ROM of her neck.  Her fatigue continues to be constant but she sleeps well nightly.  She was encouraged to continue to stay active and practice good sleep hygiene habits.   Cyclic citrullinated peptide (CCP) antibody positive  Primary osteoarthritis of both hands: She has no discomfort in her hands.  She has mild PIP and DIP synovial thickening.    Primary osteoarthritis of both hips:  She has good ROM with no discomfort.  No tenderness of trochanteric bursa.    Primary osteoarthritis of both feet: She has PIP and DIP synovial thickening consistent with osteoarthritis.  She has no discomfort in her feet at this time.  She has tenderness at the right achilles tendon insertion  site.  We discussed treatment options if she continues to have discomfort.  We also discussed proper fitting shoes.   DDD (degenerative disc disease), cervical: She has limited ROM with lateral rotation.  She has been having left sided radiculopathy symptoms.  She was advised to follow up with PCP.  She previously had a nerve conduction study that revealed  left wrist carpal tunnel.  She was advised to discuss possible left sided trigeminal neuralgia to explain her left facial pain.   Other medical conditions are listed as follows:   Osteopenia, unspecified location  Malignant neoplasm of lower-outer quadrant of right breast of female, estrogen receptor positive ([place])  Peripheral neuropathy due to chemotherapy ([place])  Gastroesophageal reflux disease, esophagitis presence not specified  Essential hypertension    Orders: Orders Placed This Encounter  Procedures  . Trigger Point Inj   No orders of the defined types were placed in this encounter.   Face-to-face time spent with patient was 30 minutes. >50% of time was spent in counseling and coordination of care.  Follow-Up Instructions: Return in about 6 months (around 06/21/2018) for Autoimmune Disease, Fibromyalgia, Osteoarthritis.   Ofilia Neas, PA-C   I examined and evaluated the patient with Hazel Sams PA.  Patient had no synovitis on my examination today.  The plan of care was discussed as noted above.  [name], MD Note - This record has been created using Editor, commissioning.  Chart creation errors have been sought, but may not always  have been located. Such creation errors do not reflect on  the standard of medical care.

## 2017-12-30 ENCOUNTER — Other Ambulatory Visit: Payer: Self-pay | Admitting: Podiatry

## 2017-12-30 ENCOUNTER — Encounter: Payer: Self-pay | Admitting: Podiatry

## 2017-12-30 ENCOUNTER — Ambulatory Visit (INDEPENDENT_AMBULATORY_CARE_PROVIDER_SITE_OTHER): Payer: BLUE CROSS/BLUE SHIELD

## 2017-12-30 ENCOUNTER — Ambulatory Visit (INDEPENDENT_AMBULATORY_CARE_PROVIDER_SITE_OTHER): Payer: BLUE CROSS/BLUE SHIELD | Admitting: Podiatry

## 2017-12-30 ENCOUNTER — Telehealth: Payer: Self-pay | Admitting: Rheumatology

## 2017-12-30 ENCOUNTER — Ambulatory Visit: Payer: BLUE CROSS/BLUE SHIELD

## 2017-12-30 ENCOUNTER — Other Ambulatory Visit: Payer: Self-pay | Admitting: Hematology and Oncology

## 2017-12-30 VITALS — BP 113/82 | HR 86 | Resp 16

## 2017-12-30 DIAGNOSIS — M25571 Pain in right ankle and joints of right foot: Secondary | ICD-10-CM

## 2017-12-30 DIAGNOSIS — M7661 Achilles tendinitis, right leg: Secondary | ICD-10-CM | POA: Diagnosis not present

## 2017-12-30 DIAGNOSIS — M7660 Achilles tendinitis, unspecified leg: Secondary | ICD-10-CM

## 2017-12-30 DIAGNOSIS — C50511 Malignant neoplasm of lower-outer quadrant of right female breast: Secondary | ICD-10-CM

## 2017-12-30 DIAGNOSIS — M25572 Pain in left ankle and joints of left foot: Secondary | ICD-10-CM | POA: Diagnosis not present

## 2017-12-30 DIAGNOSIS — M7662 Achilles tendinitis, left leg: Secondary | ICD-10-CM | POA: Diagnosis not present

## 2017-12-30 DIAGNOSIS — M79671 Pain in right foot: Secondary | ICD-10-CM

## 2017-12-30 MED ORDER — MAGIC MOUTHWASH W/LIDOCAINE: 5.0000 mL | Freq: Three times a day (TID) | ORAL | 0 refills | Status: DC | PRN

## 2017-12-30 MED ORDER — TRIAMCINOLONE ACETONIDE 10 MG/ML IJ SUSP
10.0000 mg | Freq: Once | INTRAMUSCULAR | Status: AC
  Administered 2017-12-30: 10 mg

## 2017-12-30 NOTE — Patient Instructions (Signed)

## 2017-12-30 NOTE — Telephone Encounter (Signed)
Patient called stating she is having spasms in her lower back.  Patient states she is also having mouth ulcers, and her tongue feels like she burnt it with coffee.  Patient states when she bent down she wasn't able to straighten back up.  Patient states she is currently taking Gabapentin which doesn't seem to be helping.  Patient is requesting a return call asap

## 2017-12-30 NOTE — Progress Notes (Signed)
   Subjective:    Patient ID: Tina [name], female    DOB: 01-Oct-1960, 57 y.o.   MRN: [id number]  HPI    Review of Systems  Gastrointestinal: Positive for nausea.  All other systems reviewed and are negative.      Objective:   Physical Exam        Assessment & Plan:

## 2017-12-30 NOTE — Telephone Encounter (Signed)
Patient states she is having back spasms.Patient states the pain is in her lower back.  Patient states she is having trouble standing up or walking. Patient states she has also developed mouth ulcers since her appointment on 12/19/17. Patient states that they are on her bottom lip and her tongue is very sore. Patient states when she eats any that isn't cold it irritates her tongue. Patient states when she was in the office she asked for something that would address the discomfort in her whole body.  Please advise.

## 2017-12-30 NOTE — Telephone Encounter (Signed)
The labs from February 2019 do not indicate an autoimmune disease flare.  We can call in a prescription for Magic mouthwash for oral ulcers.  She can be referred to a back specialist for lower back pain.

## 2017-12-31 ENCOUNTER — Telehealth (INDEPENDENT_AMBULATORY_CARE_PROVIDER_SITE_OTHER): Payer: Self-pay | Admitting: Orthopaedic Surgery

## 2017-12-31 NOTE — Telephone Encounter (Signed)
Patient has an appointment scheduled with Dr. Lorin Mercy on 01/21/18 at 1:30 pm.

## 2017-12-31 NOTE — Telephone Encounter (Signed)
Ok to refer to Dr. Lorin Mercy or Dr. Louanne Skye.

## 2017-12-31 NOTE — Telephone Encounter (Signed)
You can go ahead and schedule this one, first available.

## 2017-12-31 NOTE — Telephone Encounter (Signed)
Lower back pain associated with back spasms.Pt also has Ulcers in bottom lip. Dr. Estanislado Pandy recommend pt seeing a back specialist to fix pt back.     Building disc in L-5 Generated Joint disease

## 2017-12-31 NOTE — Telephone Encounter (Signed)
Patient was given prescription for Magic Mouth wash to take to pharmacy. Patient advised that her labs did not indicate an autoimmune disease flare. Patient is requesting a referral to back specialist.

## 2018-01-01 NOTE — Progress Notes (Signed)
Subjective:   Patient ID: Tina [name], female   DOB: 57 y.o.   MRN: [id number]   HPI Patient presents stating she has a lot of pain in the back of the heel that has gotten worse over the last month.  Patient states she does not remember specific injury and has had on and off problems with it for years and patient does not currently smoke and likes to be active   Review of Systems  All other systems reviewed and are negative.       Objective:  Physical Exam  Constitutional: She appears well-developed and well-nourished.  Cardiovascular: Intact distal pulses.  Pulmonary/Chest: Effort normal.  Musculoskeletal: Normal range of motion.  Neurological: She is alert.  Skin: Skin is warm.  Nursing note and vitals reviewed.   Neurovascular status intact muscle strength is adequate range of motion within normal limits with patient noted to have discomfort in the posterior medial aspect of the right heel with inflammation fluid buildup.  The center lateral portion of it is healthy with no indications of pathology and patient does have good digital perfusion and is well oriented x3     Assessment:  Inflammatory tendinitis of the Achilles right medial side posterior heel     Plan:  H&P x-ray reviewed and today I injected the posterior medial aspect of the heel 3 mg Dexasone Kenalog 5 mg Xylocaine after first discussing with her the chances for rupture associated with injection.  I have advised on reduced activity ice therapy and gradual stretching exercises and instructed her on wearing a lift in her shoe  X-ray indicates there is spur formation with no indication of stress fracture or arthritis

## 2018-01-06 ENCOUNTER — Other Ambulatory Visit: Payer: Self-pay | Admitting: Hematology and Oncology

## 2018-01-06 DIAGNOSIS — C50511 Malignant neoplasm of lower-outer quadrant of right female breast: Secondary | ICD-10-CM

## 2018-01-07 ENCOUNTER — Other Ambulatory Visit: Payer: Self-pay

## 2018-01-07 DIAGNOSIS — C50511 Malignant neoplasm of lower-outer quadrant of right female breast: Secondary | ICD-10-CM

## 2018-01-07 MED ORDER — OMEPRAZOLE 40 MG PO CPDR: 40.0000 mg | DELAYED_RELEASE_CAPSULE | Freq: Every day | ORAL | 1 refills | Status: DC

## 2018-01-15 ENCOUNTER — Telehealth: Payer: Self-pay | Admitting: Neurology

## 2018-01-15 NOTE — Telephone Encounter (Signed)
Spoke with pt who states that Dr. Estanislado Pandy suggested she be seen by her Neurologist ASAP.  Scheduled pt for f/u appointment on Tuesday, June 25th at 1:30pm

## 2018-01-15 NOTE — Telephone Encounter (Signed)
Patient called and needed a sooner appt due to her medication not working. She is on a wait list High Priority for August. Should she be seen sooner? Please Advise. Thanks

## 2018-01-20 ENCOUNTER — Encounter: Payer: Self-pay | Admitting: Podiatry

## 2018-01-20 ENCOUNTER — Ambulatory Visit (INDEPENDENT_AMBULATORY_CARE_PROVIDER_SITE_OTHER): Payer: BLUE CROSS/BLUE SHIELD | Admitting: Podiatry

## 2018-01-20 DIAGNOSIS — M25571 Pain in right ankle and joints of right foot: Secondary | ICD-10-CM

## 2018-01-20 DIAGNOSIS — M7661 Achilles tendinitis, right leg: Secondary | ICD-10-CM | POA: Diagnosis not present

## 2018-01-20 DIAGNOSIS — M25572 Pain in left ankle and joints of left foot: Secondary | ICD-10-CM | POA: Diagnosis not present

## 2018-01-20 DIAGNOSIS — C50411 Malignant neoplasm of upper-outer quadrant of right female breast: Secondary | ICD-10-CM | POA: Diagnosis not present

## 2018-01-21 ENCOUNTER — Other Ambulatory Visit: Payer: Self-pay

## 2018-01-21 ENCOUNTER — Encounter: Payer: Self-pay | Admitting: Neurology

## 2018-01-21 ENCOUNTER — Ambulatory Visit (INDEPENDENT_AMBULATORY_CARE_PROVIDER_SITE_OTHER): Payer: BLUE CROSS/BLUE SHIELD | Admitting: Neurology

## 2018-01-21 ENCOUNTER — Ambulatory Visit (INDEPENDENT_AMBULATORY_CARE_PROVIDER_SITE_OTHER): Payer: BLUE CROSS/BLUE SHIELD | Admitting: Orthopaedic Surgery

## 2018-01-21 VITALS — BP 140/88 | HR 92 | Wt 186.0 lb

## 2018-01-21 DIAGNOSIS — R51 Headache: Secondary | ICD-10-CM | POA: Diagnosis not present

## 2018-01-21 DIAGNOSIS — R519 Headache, unspecified: Secondary | ICD-10-CM

## 2018-01-21 DIAGNOSIS — G500 Trigeminal neuralgia: Secondary | ICD-10-CM | POA: Diagnosis not present

## 2018-01-21 MED ORDER — GABAPENTIN 600 MG PO TABS: 600.0000 mg | ORAL_TABLET | Freq: Three times a day (TID) | ORAL | 11 refills | Status: DC

## 2018-01-21 NOTE — Progress Notes (Signed)
NEUROLOGY FOLLOW UP OFFICE NOTE  Tina [name] [id number]  DOB: 1961/03/08  HISTORY OF PRESENT ILLNESS: I had the pleasure of seeing Tina [name] in follow-up in the neurology clinic on 01/21/2018.  The patient was last seen 2 months ago after she presented with new onset headaches in 2016 localized over the right hemisphere, suggestive of cervicogenic headaches. She had a good response to gabapentin 300mg  TID. She presents for an earlier visit due to left-sided facial pain. She states this has been going on for over a year now, but has not discussed this in the past until it started bothering her more. She reports having similar issues on the right side, but now it is on the left side. She reports the pain is "really bad," she feels like there is something moving on her gums. She has a muscle spasm on the left side of her neck feeling like there is a knot, followed by pain under her left eye, around her nose, gums, and left cheek. It feels like there is an earache with pain in front and behind her left ear down her neck. This may or may not be associated with headache and eye pain. Pain can last for days, sometimes with nausea and vomiting. She has noticed sensitivity to blowing air. If she opens her mouth while laughing, she feels air coming in her mouth and pain starts. Sometimes she looks for pressure points to stop the pain. When asked about any triggers such as stress, she states she is not under stress right now but has had a lifetime of stress, "how do you know if body is in flight or fight?"   HPI 02/10/2015: This is a pleasant 57 yo RH woman with a history of breast cancer s/p radiation and chemotherapy, hypertension, polyarthralgia, presenting for evaluation of new onset headaches. She states she has had tension or sinus headaches in the past, but a couple of months ago started having a different kind of headache with pain on the right hemisphere radiating down the right side of her face and  down her neck. She sometimes feels the gums on her right side feel sore. She describes a pressure-like pain, with occasional brief sharp, stabbing pains. There is occasional tingling in her scalp on the right side. Headache intensity can go up to a 10/10 where she would have nausea and vomiting, one time it appears she got cold and clammy and possibly briefly passed out. Headaches occur every 2 weeks or so, but would last for several days. She reports that she had a headache daily for 2 weeks until last week. She was feeling better for a week, then again has a mild headache today. This is associated with neck pain, and she reports that shoulder and neck pain have been bothering her on and off for years, but this is the worst it has been. She has seen her rheumatologist and told she may have fibromyalgia.  She denies any vision changes, no visual obscurations, conjunctival injection. She denies any diplopia, dysarthria, dysphagia, focal weakness, bowel/bladder incontinence. She has been told she has fluid in the right ear, and has occasional high-pitched tinnitus. She has intermittent numbness in her left arm on a daily basis. She has chronic back pain and states Flexeril did not help, but she would use marijuana and this relaxes her and relieves the pain. She had been taking gabapentin 300mg  TID for a year for neuropathy secondary to chemo, and had self-reduced it a couple of months ago  to 100mg  TID. She reports taking prn Ativan for nausea. She reports sleep is fine, she had a sleep study negative for sleep apnea. She denies any history of head injuries or recent infections, no family history of headaches.   I personally reviewed MRI brain with and without contrast which was normal, no abnormal enhancement seen. Her MRI C-spine showed degenerative change from C3-4 through C7-T1. At C4-5, there is a central disc herniation that contacts the ventral aspect of the cord but does not frankly compress or show any  cord edema. There is mild foraminal narrowing on the left at C5-6 and bilaterally at C6-7, but no definite compression of the exiting nerve roots. C7-T1 shows a shallow broad-based disc herniation but without visible nerve compression.  PAST MEDICAL HISTORY: Past Medical History:  Diagnosis Date  . Anxiety   . Breast cancer ([place]) 2015   lumpectomy  . Cancer of right breast ([place]) 09/03/13   Invasive Ductal Carcinoma/Ductal Carcinoma Insitu  biopsies  . Chronic low back pain 01/24/2016   -and R hip pain -seen by rheumatologist and ortho in the past -MRI with Dr. Lorin Mercy in 2015, R hip plain films in 2015 as well acupuncture, injections, tramadol not helpful trigger [facility] and PT helpful   . Degenerative joint disease    back neck and shoulder  . Fibromyalgia    Dr. Estanislado Pandy  . GERD (gastroesophageal reflux disease)    hx esophagitis  . Hot flashes   . Hypertension   . Leukopenia 06/16/2013  . Lupus [facility])    rheumatologist - Dr. Estanislado Pandy  . Migraines   . 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 Personal history of chemotherapy    2015  . Personal history of radiation therapy 2015  . S/P radiation therapy 04/27/2014-06/07/2014   1) Right breast, 50 Gy in 25 fractions/ 2) Right breast boost, 10 Gy in 5 fractions    MEDICATIONS: Current Outpatient Medications on File Prior to Visit  Medication Sig Dispense Refill  . aspirin 81 MG tablet Take 81 mg by mouth daily.    Marland Kitchen b complex vitamins capsule Take 1 capsule by mouth daily.    Marland Kitchen BIOTIN 5000 PO Take 1 capsule by mouth daily.     . Calcium Carbonate-Vit D-Min (CALTRATE 600+D PLUS MINERALS PO) Take 1 tablet by mouth daily.    [name] Calcium (STOOL SOFTENER PO) Take by mouth as needed.    . gabapentin (NEURONTIN) 300 MG capsule Take 1 capsule (300 mg total) by mouth 3 (three) times daily. 270 capsule 3  . ibuprofen (ADVIL,MOTRIN) 800 MG tablet Take 1 tablet (800 mg total) by mouth  every 8 (eight) hours as needed. 21 tablet 0  . lisinopril-hydrochlorothiazide (PRINZIDE,ZESTORETIC) 20-12.5 MG tablet TAKE 1 TABLET DAILY (PATIENT NEEDS AN APPOINTMENT) 90 tablet 0  . loratadine (CLARITIN) 10 MG tablet Take 10 mg by mouth daily.    . magic mouthwash w/lidocaine SOLN Take 5 mLs by mouth 3 (three) times daily as needed for mouth pain. 150 mL 0  . MAGNESIUM PO Take by mouth daily.    . methocarbamol (ROBAXIN) 500 MG tablet Take three times a day as needed for muscle spasms 30 tablet 11  . omeprazole (PRILOSEC) 40 MG capsule Take 1 capsule (40 mg total) by mouth daily. 90 capsule 1  . Polyethylene Glycol 3350 (MIRALAX PO) Take by mouth as needed.    . SUMAtriptan (IMITREX) 25 MG tablet Take 1 tablet (  25 mg total) by mouth every 2 (two) hours as needed for migraine. May repeat in 2 hours if needed. Do not take more then 2 tablets in 24 hours. 10 tablet 0  . TURMERIC PO Take by mouth daily.    Marland Kitchen venlafaxine XR (EFFEXOR-XR) 75 MG 24 hr capsule TAKE 1 CAPSULE DAILY WITH BREAKFAST 90 capsule 0  . VITAMIN E EX Apply topically. Vaginal suppository     Current Facility-Administered Medications on File Prior to Visit  Medication Dose Route Frequency Provider Last Rate Last Dose  . prochlorperazine (COMPAZINE) injection 10 mg  10 mg Intravenous Q6H PRN Gardenia Phlegm, NP   10 mg at 12/26/13 9937    ALLERGIES: Allergies  Allergen Reactions  . Adhesive [Tape] Itching    Burned and scarred skinned  . Other     Cholroprep-causes rash  . Nickel Rash    FAMILY HISTORY: Family History  Problem Relation Age of Onset  . Hypertension Mother   . Dementia Mother        small vessel disease  . Cancer Mother 30       cervical  cancer  . Cancer Father 37       prostate ca  . Diabetes Father   . Arthritis Father   . Breast cancer Neg Hx     SOCIAL HISTORY: Social History   Socioeconomic History  . Marital status: Married    Spouse name: Not on file  . Number of  children: 1  . Years of education: Not on file  . Highest education level: Not on file  Occupational History    Comment: homemaker  Social Needs  . Financial resource strain: Not on file  . Food insecurity:    Worry: Not on file    Inability: Not on file  . Transportation needs:    Medical: Not on file    Non-medical: Not on file  Tobacco Use  . Smoking status: Former Smoker    Packs/day: 1.00    Years: 15.00    Pack years: 15.00    Types: Cigars, Cigarettes    Last attempt to quit: 02/23/2004    Years since quitting: 13.9  . Smokeless tobacco: Never Used  . Tobacco comment: remote smoking history  Substance and Sexual Activity  . Alcohol use: Yes    Alcohol/week: 0.0 oz    Comment: rare  . Drug use: Yes    Comment: Marjuana as needed   . Sexual activity: Yes  Lifestyle  . Physical activity:    Days per week: Not on file    Minutes per session: Not on file  . Stress: Not on file  Relationships  . Social connections:    Talks on phone: Not on file    Gets together: Not on file    Attends religious service: Not on file    Active member of club or organization: Not on file    Attends meetings of clubs or organizations: Not on file    Relationship status: Not on file  . Intimate partner violence:    Fear of current or ex partner: Not on file    Emotionally abused: Not on file    Physically abused: Not on file    Forced sexual activity: Not on file  Other Topics Concern  . Not on file  Social History Narrative   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      Home Situation: lives with husband and 59 yo son  Spiritual Beliefs: Christian      Lifestyle: elliptical 3-4 times per week; working on diet as well over last year in 2014             REVIEW OF SYSTEMS: Constitutional: No fevers, chills, or sweats, no generalized fatigue, change in appetite Eyes: No visual changes, double vision, eye pain Ear, nose and throat: No hearing loss, ear pain, nasal congestion, sore  throat Cardiovascular: No chest pain, palpitations Respiratory:  No shortness of breath at rest or with exertion, wheezes GastrointestinaI: No nausea, vomiting, diarrhea, abdominal pain, fecal incontinence Genitourinary:  No dysuria, urinary retention or frequency Musculoskeletal:  + neck pain, back pain Integumentary: No rash, pruritus, skin lesions Neurological: as above Psychiatric: No depression, insomnia, anxiety Endocrine: No palpitations, fatigue, diaphoresis, mood swings, change in appetite, change in weight, increased thirst Hematologic/Lymphatic:  No anemia, purpura, petechiae. Allergic/Immunologic: no itchy/runny eyes, nasal congestion, recent allergic reactions, rashes  PHYSICAL EXAM: Vitals:   01/21/18 1337  BP: 140/88  Pulse: 92  SpO2: 99%   General: No acute distress Head:  Normocephalic/atraumatic Neck: supple, no paraspinal tenderness, full range of motion Heart:  Regular rate and rhythm Lungs:  Clear to auscultation bilaterally Back: No paraspinal tenderness Skin/Extremities: No rash, no edema Neurological Exam: alert and oriented to person, place, and time. No aphasia or dysarthria. Fund of knowledge is appropriate.  Recent and remote memory are intact.  Attention and concentration are normal.    Able to name objects and repeat phrases. Cranial nerves: Pupils equal, round, reactive to light. Extraocular movements intact with no nystagmus. Visual fields full. Facial sensation intact to cold, pin, light touch. +corneal reflex bilaterally. No facial asymmetry. Tongue, uvula, palate midline.  Motor: Bulk and tone normal, muscle strength 5/5 throughout with no pronator drift, no hand weakness today. Sensation to light touch, pin, cold intact.  No extinction to double simultaneous stimulation.  Deep tendon reflexes 2+ throughout, toes downgoing.  Finger to nose testing intact.  Gait narrow-based and steady, able to tandem walk adequately.  Romberg negative.    IMPRESSION: This is a pleasant 57 yo RH woman with a history of breast cancer s/p radiation and chemotherapy, hypertension, polyarthralgia, who presented with new onset headaches in 2016 that localized over the right side, with significant neck pain, suggestive of cervicogenic headaches. MRI brain in 2016 was normal. She has had excellent response to PT and gabapentin 300mg  TID with no side effects. She presents today with new symptoms of left-sided facial pain radiating to the ear and down her neck. Her neurological exam is non-focal. Symptoms suggestive of trigeminal neuralgia, MRI brain with and without contrast (trigeminal neuralgia protocol) will be ordered to assess for underlying structural abnormality. We discussed how gabapentin can help with her symptoms as well, increase slowly every week up to 600mg  TID, side effects discussed. She will follow-up in 6 months and knows to call our office for any changes.  Thank you for allowing me to participate in her care.  Please do not hesitate to call for any questions or concerns.  The duration of this appointment visit was 30 minutes of face-to-face time with the patient.  Greater than 50% of this time was spent in counseling, explanation of diagnosis, planning of further management, and coordination of care.   Ellouise Newer, M.D.   CC: Dr. Maudie Mercury

## 2018-01-21 NOTE — Patient Instructions (Addendum)
1. Schedule MRI brain with and without contrast (Trigeminal Neuralgia protocol)  We have sent a referral to [facility] for your MRI and they will call you directly to schedule your appt. They are located at [place]. If you need to contact them directly please call [phone].    2. Increase Gabapentin 300mg : Take 1 cap in AM, 1 cap at noon, 2 caps at night for 1 week, then increase to 1 cap in AM, 2 caps at noon, 2 caps at night for 1 week, then increase to 2 caps three times a day. After you finish with current bottle, your new prescription will be for Gabapentin 600mg : take 1 tablet three times a day  3. Follow-up in 6 months, call for any changes. We can make medication changes over the phone.

## 2018-01-22 NOTE — Progress Notes (Signed)
Subjective:   Patient ID: Tina [name], female   DOB: 56 y.o.   MRN: [id number]   HPI Patient states my heel is feeling much better with diminished discomfort   ROS      Objective:  Physical Exam  Neurovascular status intact with posterior right heel improving with pain still present upon deep palpation but significant improvement from previous     Assessment:  Achilles tendinitis right improved     Plan:  Advised on physical therapy supportive shoes heel lift and instructed on ice therapy.  Patient's discharge will be seen back as needed

## 2018-01-29 ENCOUNTER — Encounter: Payer: Self-pay | Admitting: Neurology

## 2018-02-22 ENCOUNTER — Other Ambulatory Visit: Payer: BLUE CROSS/BLUE SHIELD

## 2018-02-24 NOTE — Progress Notes (Signed)
Office Visit Note  Patient: Tina [name]             Date of Birth: 06/25/1961           MRN: [id number]             PCP: [name], DO Referring: [name], DO Visit Date: 03/06/2018 Occupation: '@GUAROCC'$ @  Subjective:  Right ankle swelling.   History of Present Illness: Tina [name] is a 57 y.o. female history of autoimmune disease, fibromyalgia and osteoarthritis.  She states she developed some swelling on her right ankle about 2 months ago.  She was seen by a podiatrist and had cortisone injection which is improved.  None of the other joints are painful.  She is not having much discomfort from osteoarthritis.  He continues to have discomfort in her cervical spine.  Is not having much discomfort from fibromyalgia currently.  Use of gabapentin has helped her neurologist.  Activities of Daily Living:  Patient reports morning stiffness for 0 minutes.   Patient Reports nocturnal pain.  Difficulty dressing/grooming: Denies Difficulty climbing stairs: Denies Difficulty getting out of chair: Denies Difficulty using hands for taps, buttons, cutlery, and/or writing: Denies  Review of Systems  Constitutional: Positive for fatigue.  HENT: Negative for mouth sores, trouble swallowing, trouble swallowing and mouth dryness.   Eyes: Positive for dryness.  Respiratory: Negative for shortness of breath and difficulty breathing.   Cardiovascular: Negative for chest pain and swelling in legs/feet.  Gastrointestinal: Negative for abdominal pain and diarrhea.  Endocrine: Negative for increased urination.  Genitourinary: Negative for pelvic pain.  Musculoskeletal: Positive for arthralgias and joint pain. Negative for joint swelling and morning stiffness.  Skin: Negative for rash.  Allergic/Immunologic: Negative for susceptible to infections.  Neurological: Negative for dizziness, light-headedness, headaches, memory loss and weakness.  Hematological: Negative for bruising/bleeding  tendency.  Psychiatric/Behavioral: Negative for confusion.    PMFS History:  Patient Active Problem List   Diagnosis Date Noted  . Fibromyalgia 02/28/2016  . Chronic low back pain 01/24/2016  . Lupus ([place]) 04/19/2015  . GERD (gastroesophageal reflux disease) 04/19/2015  . Cervicogenic headache 02/10/2015  . Neck pain 02/10/2015  . Hot flashes 10/26/2014  . GAD (generalized anxiety disorder) 10/26/2014  . Peripheral neuropathy due to chemotherapy ([place]) 03/12/2014  . Breast cancer of lower-outer quadrant of right female breast ([place]) 09/15/2013  . Left shoulder pain - seeing Dr. Lorin Mercy 08/20/2013  . Hypertension     Past Medical History:  Diagnosis Date  . Anxiety   . Breast cancer ([place]) 2015   lumpectomy  . Cancer of right breast ([place]) 09/03/13   Invasive Ductal Carcinoma/Ductal Carcinoma Insitu  biopsies  . Chronic low back pain 01/24/2016   -and R hip pain -seen by rheumatologist and ortho in the past -MRI with Dr. Lorin Mercy in 2015, R hip plain films in 2015 as well acupuncture, injections, tramadol not helpful [facility] and PT helpful   . Degenerative joint disease    back neck and shoulder  . Fibromyalgia    Dr. Estanislado Pandy  . GERD (gastroesophageal reflux disease)    hx esophagitis  . Hot flashes   . Hypertension   . Leukopenia 06/16/2013  . Lupus [facility])    rheumatologist - Dr. Estanislado Pandy  . Migraines   . Neuromuscular disorder ([place])    neuropathy from chemo in feet/hands  . Personal history of chemotherapy    2015  . Personal history of radiation therapy 2015  .  S/P radiation therapy 04/27/2014-06/07/2014   1) Right breast, 50 Gy in 25 fractions/ 2) Right breast boost, 10 Gy in 5 fractions    Family History  Problem Relation Age of Onset  . Hypertension Mother   . Dementia Mother        small vessel disease  . Cancer Mother 30       cervical  cancer  . Cancer Father 35       prostate ca  . Diabetes Father   . Arthritis Father   . Breast  cancer Neg Hx    Past Surgical History:  Procedure Laterality Date  . BREAST BIOPSY Left 2015   benign core  . BREAST BIOPSY Right 2015   malignant core   . BREAST LUMPECTOMY Right   . BREAST SURGERY  03/01/2014   hx right breast cancer- 3 lymph nodes removed and lumpectomy  . [place]   rt  . CESAREAN SECTION     x 3  . PORTACATH PLACEMENT  08/2013   still currently active   . ROBOTIC ASSISTED BILATERAL SALPINGO OOPHERECTOMY Bilateral 07/09/2014   Procedure: ROBOTIC ASSISTED BILATERAL SALPINGO OOPHORECTOM; :LYSIS OF ADHESIONS;  Surgeon: Marvene Staff, MD;  Location: [facility];  Service: Gynecology;  Laterality: Bilateral;  . TRIGGER FINGER RELEASE  2004   rt thumb  . trigger finger release left thumb  06/28/14   [facility]  . TUBAL LIGATION    . WISDOM TOOTH EXTRACTION     Social History   Social History Narrative   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      Home Situation: lives with husband and 49 yo son      Spiritual Beliefs: Christian      Lifestyle: elliptical 3-4 times per week; working on diet as well over last year in 2014             Objective: Vital Signs: BP (!) 129/92 (BP Location: Left Arm, Patient Position: Sitting, Cuff Size: Normal)   Pulse 77   Resp 13   Ht _0  (1.626 m)   Wt 184 lb 3.2 oz (83.6 kg)   LMP 09/28/2013 (Approximate)   BMI 31.62 kg/m    Physical Exam  Constitutional: She is oriented to person, place, and time. She appears well-developed and well-nourished.  HENT:  Head: Normocephalic and atraumatic.  Eyes: Conjunctivae and EOM are normal.  Neck: Normal range of motion.  Cardiovascular: Normal rate, regular rhythm, normal heart sounds and intact distal pulses.  Pulmonary/Chest: Effort normal and breath sounds normal.  Abdominal: Soft. Bowel sounds are normal.  Lymphadenopathy:    She has no cervical adenopathy.  Neurological: She is alert and oriented to person, place, and time.  Skin: Skin  is warm and dry. Capillary refill takes less than 2 seconds.  Psychiatric: She has a normal mood and affect. Her behavior is normal.  Nursing note and vitals reviewed.    Musculoskeletal Exam: Spine thoracic lumbar spine good range of motion.  Shoulder joints elbow joints wrist joint MCPs PIPs DIPs were in good range of motion with no synovitis.  Hip joints knee joints ankles MTPs PIPs were in good range of motion with no synovitis.  She had few tender points.  CDAI Exam: No CDAI exam completed.   Investigation: No additional findings.  Imaging: Mr Face/trigeminal Wo/w Cm  Result Date: 02/27/2018 CLINICAL DATA:  Trigeminal neuralgia. LEFT-sided facial pain. Breast cancer. EXAM: MRI FACE TRIGEMINAL WITHOUT AND WITH  CONTRAST TECHNIQUE: Multiplanar, multiecho pulse sequences of the face and surrounding structures, including thin slice imaging of the course of the Trigeminal Nerves, were obtained both before and after administration of intravenous contrast. CONTRAST:  74m MULTIHANCE GADOBENATE DIMEGLUMINE 529 MG/ML IV SOLN COMPARISON:  MRI brain 02/06/2015. FINDINGS: Targeted IAC protocol was employed. Only portions of the brain are included in the examination. Normal brainstem anatomy. Normal course of the fifth nerve on the RIGHT and LEFT into the cavernous sinus. There is no cavernous sinus mass, vascular loop, or trigeminal nerve enlargement or enhancement. No skull base abnormality is seen. In the infratemporal fossa, there is no evidence for perineural tumor spread, or worrisome soft tissue/osseous lesion. Negative orbits and sinuses. Negative visualized adjacent brain, including temporal lobes and middle cranial fossa. Flow voids are maintained and appear normal in the carotid and basilar arteries. Normal post infusion appearance to the pituitary, pituitary stalk, chiasm, and cavernous sinuses. No extra-axial mass such as meningioma or concern for leptomeningeal/pachymeningeal disease. No abnormal  enhancement of the regional cerebral or brainstem structures. IMPRESSION: Negative MRI of the brain with trigeminal protocol. No structural abnormality of the trigeminal nerve is seen. No secondary cause for trigeminal neuralgia is observed. Electronically Signed   By: JStaci RighterM.D.   On: 02/27/2018 14:19    Recent Labs: Lab Results  Component Value Date   WBC 3.6 (L) 09/06/2017   HGB 12.6 09/06/2017   PLT 237 09/06/2017   NA 141 09/06/2017   K 3.7 09/06/2017   CL 104 09/06/2017   CO2 28 09/06/2017   GLUCOSE 90 09/06/2017   BUN 14 09/06/2017   CREATININE 0.87 09/06/2017   BILITOT 0.6 09/06/2017   ALKPHOS 54 10/11/2016   AST 22 09/06/2017   ALT 18 09/06/2017   PROT 7.5 09/06/2017   ALBUMIN 4.3 10/11/2016   CALCIUM 9.7 09/06/2017   GFRAA 88 09/06/2017    Speciality Comments: No specialty comments available.  Procedures:  No procedures performed Allergies: Adhesive [tape]; Other; and Nickel   Assessment / Plan:     Visit Diagnoses: Autoimmune disease ([place] - + ANA 1:320 [place]-patient had past Hx of neutropenia, fatigue, oral ulcers, sicca symptoms, parotitis, Previously on PLQ.  She has been off Plaquenil for a long time.  She is currently asymptomatic.  She states she has mild sicca symptoms but they are tolerable.  Her labs done in February showed positive ANA, double-stranded DNA, ESR, complements were normal.  Fibromyalgia-doing better since she has been doing regular exercise.  Weight loss diet and exercise was encouraged.  Cyclic citrullinated peptide (CCP) antibody positive-association of anti-CCP with rheumatoid arthritis was discussed.  She has no synovitis on examination.  Primary osteoarthritis of both hands-joint protection muscle strengthening was discussed.  Primary osteoarthritis of both hips-she had good range of motion without discomfort.  Primary osteoarthritis of both feet  DDD (degenerative disc disease), cervical-she continues to have some  discomfort in her cervical spine.  She has been doing some exercises.  Osteopenia, unspecified location-use of calcium and vitamin D was discussed.  Essential hypertension-her diastolic blood pressure is slightly elevated she will continue to monitor that.  Peripheral neuropathy due to chemotherapy ([employer]  Malignant neoplasm of lower-outer quadrant of right breast of female, estrogen receptor positive ([place]  Gastroesophageal reflux disease, esophagitis presence not specified   Orders: No orders of the defined types were placed in this encounter.  No orders of the defined types were placed in this encounter.     Follow-Up Instructions:  Return for Autoimmune disease, Osteoarthritis, FMS.   [name], MD  Note - This record has been created using Editor, commissioning.  Chart creation errors have been sought, but may not always  have been located. Such creation errors do not reflect on  the standard of medical care.

## 2018-02-25 ENCOUNTER — Other Ambulatory Visit: Payer: Self-pay

## 2018-02-25 DIAGNOSIS — G500 Trigeminal neuralgia: Secondary | ICD-10-CM

## 2018-02-27 ENCOUNTER — Ambulatory Visit
Admission: RE | Admit: 2018-02-27 | Discharge: 2018-02-27 | Disposition: A | Discharge status: Home / Self Care | Payer: BLUE CROSS/BLUE SHIELD | Source: Ambulatory Visit | Attending: Neurology | Admitting: Neurology

## 2018-02-27 ENCOUNTER — Inpatient Hospital Stay: Admission: RE | Admit: 2018-02-27 | Payer: BLUE CROSS/BLUE SHIELD | Source: Ambulatory Visit

## 2018-02-27 DIAGNOSIS — G500 Trigeminal neuralgia: Secondary | ICD-10-CM | POA: Diagnosis not present

## 2018-02-27 MED ORDER — GADOBENATE DIMEGLUMINE 529 MG/ML IV SOLN
15.0000 mL | Freq: Once | INTRAVENOUS | Status: AC | PRN
  Administered 2018-02-27: 15 mL via INTRAVENOUS

## 2018-03-06 ENCOUNTER — Ambulatory Visit: Payer: BLUE CROSS/BLUE SHIELD | Admitting: Rheumatology

## 2018-03-06 ENCOUNTER — Encounter: Payer: Self-pay | Admitting: Physician Assistant

## 2018-03-06 ENCOUNTER — Other Ambulatory Visit: Payer: Self-pay

## 2018-03-06 ENCOUNTER — Telehealth: Payer: Self-pay

## 2018-03-06 ENCOUNTER — Ambulatory Visit (INDEPENDENT_AMBULATORY_CARE_PROVIDER_SITE_OTHER): Payer: BLUE CROSS/BLUE SHIELD | Admitting: Rheumatology

## 2018-03-06 VITALS — BP 129/92 | HR 77 | Resp 13 | Ht 64.0 in | Wt 184.2 lb

## 2018-03-06 DIAGNOSIS — M19041 Primary osteoarthritis, right hand: Secondary | ICD-10-CM

## 2018-03-06 DIAGNOSIS — R7989 Other specified abnormal findings of blood chemistry: Secondary | ICD-10-CM | POA: Diagnosis not present

## 2018-03-06 DIAGNOSIS — K219 Gastro-esophageal reflux disease without esophagitis: Secondary | ICD-10-CM

## 2018-03-06 DIAGNOSIS — R768 Other specified abnormal immunological findings in serum: Secondary | ICD-10-CM

## 2018-03-06 DIAGNOSIS — D8989 Other specified disorders involving the immune mechanism, not elsewhere classified: Secondary | ICD-10-CM

## 2018-03-06 DIAGNOSIS — G620 Drug-induced polyneuropathy: Secondary | ICD-10-CM

## 2018-03-06 DIAGNOSIS — C50511 Malignant neoplasm of lower-outer quadrant of right female breast: Secondary | ICD-10-CM

## 2018-03-06 DIAGNOSIS — Z170 Estrogen receptor positive status [ER+]: Secondary | ICD-10-CM

## 2018-03-06 DIAGNOSIS — M797 Fibromyalgia: Secondary | ICD-10-CM

## 2018-03-06 DIAGNOSIS — T451X5A Adverse effect of antineoplastic and immunosuppressive drugs, initial encounter: Secondary | ICD-10-CM

## 2018-03-06 DIAGNOSIS — M8580 Other specified disorders of bone density and structure, unspecified site: Secondary | ICD-10-CM

## 2018-03-06 DIAGNOSIS — I10 Essential (primary) hypertension: Secondary | ICD-10-CM

## 2018-03-06 DIAGNOSIS — M19071 Primary osteoarthritis, right ankle and foot: Secondary | ICD-10-CM

## 2018-03-06 DIAGNOSIS — M19072 Primary osteoarthritis, left ankle and foot: Secondary | ICD-10-CM

## 2018-03-06 DIAGNOSIS — M5030 Other cervical disc degeneration, unspecified cervical region: Secondary | ICD-10-CM

## 2018-03-06 DIAGNOSIS — M19042 Primary osteoarthritis, left hand: Secondary | ICD-10-CM

## 2018-03-06 DIAGNOSIS — M160 Bilateral primary osteoarthritis of hip: Secondary | ICD-10-CM

## 2018-03-06 DIAGNOSIS — M359 Systemic involvement of connective tissue, unspecified: Secondary | ICD-10-CM

## 2018-03-06 MED ORDER — OMEPRAZOLE 40 MG PO CPDR: 40.0000 mg | DELAYED_RELEASE_CAPSULE | Freq: Every day | ORAL | 0 refills | Status: DC

## 2018-03-06 NOTE — Telephone Encounter (Signed)
-----   Message from [name], MD sent at 03/04/2018 10:16 AM EDT ----- Pls let her know the MRI looks good, no evidence of tumor, stroke, or bleed. Nothing pressing on the nerve. At this point it is really medication treatment to help with her symptoms, how is she feeling on higher dose gabapentin? Thanks

## 2018-03-06 NOTE — Patient Instructions (Signed)
Cervical Strain and Sprain Rehab Ask your health care provider which exercises are safe for you. Do exercises exactly as told by your health care provider and adjust them as directed. It is normal to feel mild stretching, pulling, tightness, or discomfort as you do these exercises, but you should stop right away if you feel sudden pain or your pain gets worse.Do not begin these exercises until told by your health care provider. Stretching and range of motion exercises These exercises warm up your muscles and joints and improve the movement and flexibility of your neck. These exercises also help to relieve pain, numbness, and tingling. Exercise A: Cervical side bend  1. Using good posture, sit on a stable chair or stand up. 2. Without moving your shoulders, slowly tilt your left / right ear to your shoulder until you feel a stretch in your neck muscles. You should be looking straight ahead. 3. Hold for __________ seconds. 4. Repeat with the other side of your neck. Repeat __________ times. Complete this exercise __________ times a day. Exercise B: Cervical rotation  1. Using good posture, sit on a stable chair or stand up. 2. Slowly turn your head to the side as if you are looking over your left / right shoulder. ? Keep your eyes level with the ground. ? Stop when you feel a stretch along the side and the back of your neck. 3. Hold for __________ seconds. 4. Repeat this by turning to your other side. Repeat __________ times. Complete this exercise __________ times a day. Exercise C: Thoracic extension and pectoral stretch 1. Roll a towel or a small blanket so it is about 4 inches (10 cm) in diameter. 2. Lie down on your back on a firm surface. 3. Put the towel lengthwise, under your spine in the middle of your back. It should not be not under your shoulder blades. The towel should line up with your spine from your middle back to your lower back. 4. Put your hands behind your head and let your  elbows fall out to your sides. 5. Hold for __________ seconds. Repeat __________ times. Complete this exercise __________ times a day. Strengthening exercises These exercises build strength and endurance in your neck. Endurance is the ability to use your muscles for a long time, even after your muscles get tired. Exercise D: Upper cervical flexion, isometric 1. Lie on your back with a thin pillow behind your head and a small rolled-up towel under your neck. 2. Gently tuck your chin toward your chest and nod your head down to look toward your feet. Do not lift your head off the pillow. 3. Hold for __________ seconds. 4. Release the tension slowly. Relax your neck muscles completely before you repeat this exercise. Repeat __________ times. Complete this exercise __________ times a day. Exercise E: Cervical extension, isometric  1. Stand about 6 inches (15 cm) away from a wall, with your back facing the wall. 2. Place a soft object, about 6-8 inches (15-20 cm) in diameter, between the back of your head and the wall. A soft object could be a small pillow, a ball, or a folded towel. 3. Gently tilt your head back and press into the soft object. Keep your jaw and forehead relaxed. 4. Hold for __________ seconds. 5. Release the tension slowly. Relax your neck muscles completely before you repeat this exercise. Repeat __________ times. Complete this exercise __________ times a day. Posture and body mechanics  Body mechanics refers to the movements and positions of   your body while you do your daily activities. Posture is part of body mechanics. Good posture and healthy body mechanics can help to relieve stress in your body's tissues and joints. Good posture means that your spine is in its natural S-curve position (your spine is neutral), your shoulders are pulled back slightly, and your head is not tipped forward. The following are general guidelines for applying improved posture and body mechanics to  your everyday activities. Standing  When standing, keep your spine neutral and keep your feet about hip-width apart. Keep a slight bend in your knees. Your ears, shoulders, and hips should line up.  When you do a task in which you stand in one place for a long time, place one foot up on a stable object that is 2-4 inches (5-10 cm) high, such as a footstool. This helps keep your spine neutral. Sitting   When sitting, keep your spine neutral and your keep feet flat on the floor. Use a footrest, if necessary, and keep your thighs parallel to the floor. Avoid rounding your shoulders, and avoid tilting your head forward.  When working at a desk or a computer, keep your desk at a height where your hands are slightly lower than your elbows. Slide your chair under your desk so you are close enough to maintain good posture.  When working at a computer, place your monitor at a height where you are looking straight ahead and you do not have to tilt your head forward or downward to look at the screen. Resting When lying down and resting, avoid positions that are most painful for you. Try to support your neck in a neutral position. You can use a contour pillow or a small rolled-up towel. Your pillow should support your neck but not push on it. This information is not intended to replace advice given to you by your health care provider. Make sure you discuss any questions you have with your health care provider. Document Released: 07/16/2005 Document Revised: 03/22/2016 Document Reviewed: 06/22/2015 Elsevier Interactive Patient Education  2018 Elsevier Inc.  

## 2018-03-06 NOTE — Telephone Encounter (Signed)
Spoke with pt relaying message below.  She states that she is doing really well on gabapentin increase.

## 2018-03-09 ENCOUNTER — Other Ambulatory Visit: Payer: Self-pay | Admitting: Family Medicine

## 2018-03-18 ENCOUNTER — Encounter

## 2018-03-18 ENCOUNTER — Ambulatory Visit: Payer: BLUE CROSS/BLUE SHIELD | Admitting: Neurology

## 2018-03-27 NOTE — Progress Notes (Deleted)
Office Visit Note  Patient: Tina [name]             Date of Birth: [birth year]           MRN: [id number]             PCP: [name], DO Referring: [name], DO Visit Date: 03/28/2018 Occupation: @GUAROCC @  Subjective:  No chief complaint on file.   History of Present Illness: Tina [name] is a 57 y.o. female ***   Activities of Daily Living:  Patient reports morning stiffness for *** {minute/hour:19697}.   Patient {ACTIONS;DENIES/REPORTS:21021675::"Denies"} nocturnal pain.  Difficulty dressing/grooming: {ACTIONS;DENIES/REPORTS:21021675::"Denies"} Difficulty climbing stairs: {ACTIONS;DENIES/REPORTS:21021675::"Denies"} Difficulty getting out of chair: {ACTIONS;DENIES/REPORTS:21021675::"Denies"} Difficulty using hands for taps, buttons, cutlery, and/or writing: {ACTIONS;DENIES/REPORTS:21021675::"Denies"}  No Rheumatology ROS completed.   PMFS History:  Patient Active Problem List   Diagnosis Date Noted  . Fibromyalgia 02/28/2016  . Chronic low back pain 01/24/2016  . Lupus ([place]) 04/19/2015  . GERD (gastroesophageal reflux disease) 04/19/2015  . Cervicogenic headache 02/10/2015  . Neck pain 02/10/2015  . Hot flashes 10/26/2014  . GAD (generalized anxiety disorder) 10/26/2014  . Peripheral neuropathy due to chemotherapy ([place]) 03/12/2014  . Breast cancer of lower-outer quadrant of right female breast ([place]) 09/15/2013  . Left shoulder pain - seeing Dr. Lorin Mercy 08/20/2013  . Hypertension     Past Medical History:  Diagnosis Date  . Anxiety   . Breast cancer ([place]) 2015   lumpectomy  . Cancer of right breast ([place]) 09/03/13   Invasive Ductal Carcinoma/Ductal Carcinoma Insitu  biopsies  . Chronic low back pain 01/24/2016   -and R hip pain -seen by rheumatologist and ortho in the past -MRI with Dr. Lorin Mercy in 2015, R hip plain films in 2015 as well acupuncture, injections, tramadol not helpful trigger therapy at [facility] and PT helpful   . Degenerative joint  disease    back neck and shoulder  . Fibromyalgia    Dr. Estanislado Pandy  . GERD (gastroesophageal reflux disease)    hx esophagitis  . Hot flashes   . Hypertension   . Leukopenia 06/16/2013  . Lupus [facility])    rheumatologist - Dr. Estanislado Pandy  . Migraines   . Neuromuscular disorder ([place])    neuropathy from chemo in feet/hands  . Personal history of chemotherapy    2015  . Personal history of radiation therapy 2015  . S/P radiation therapy 04/27/2014-06/07/2014   1) Right breast, 50 Gy in 25 fractions/ 2) Right breast boost, 10 Gy in 5 fractions    Family History  Problem Relation Age of Onset  . Hypertension Mother   . Dementia Mother        small vessel disease  . Cancer Mother 30       cervical  cancer  . Cancer Father 30       prostate ca  . Diabetes Father   . Arthritis Father   . Breast cancer Neg Hx    Past Surgical History:  Procedure Laterality Date  . BREAST BIOPSY Left 2015   benign core  . BREAST BIOPSY Right 2015   malignant core   . BREAST LUMPECTOMY Right   . BREAST SURGERY  03/01/2014   hx right breast cancer- 3 lymph nodes removed and lumpectomy  . [place]   rt  . CESAREAN SECTION     x 3  . PORTACATH PLACEMENT  08/2013   still currently active   . ROBOTIC ASSISTED BILATERAL SALPINGO OOPHERECTOMY  Bilateral 07/09/2014   Procedure: ROBOTIC ASSISTED BILATERAL SALPINGO OOPHORECTOM; :LYSIS OF ADHESIONS;  Surgeon: Marvene Staff, MD;  Location: [facility];  Service: Gynecology;  Laterality: Bilateral;  . TRIGGER FINGER RELEASE  2004   rt thumb  . trigger finger release left thumb  06/28/14   at orthopedic surgical center  . TUBAL LIGATION    . WISDOM TOOTH EXTRACTION     Social History   Social History Narrative   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      Home Situation: lives with husband and 27 yo son      Spiritual Beliefs: Christian      Lifestyle: elliptical 3-4 times per week; working on diet as well over last year in 2014               Objective: Vital Signs: LMP 09/28/2013 (Approximate)    Physical Exam   Musculoskeletal Exam: ***  CDAI Exam: CDAI Score: Not documented Patient Global Assessment: Not documented; Provider Global Assessment: Not documented Swollen: Not documented; Tender: Not documented Joint Exam   Not documented   There is currently no information documented on the homunculus. Go to the Rheumatology activity and complete the homunculus joint exam.  Investigation: No additional findings.  Imaging: Mr Face/trigeminal Wo/w Cm  Result Date: 02/27/2018 CLINICAL DATA:  Trigeminal neuralgia. LEFT-sided facial pain. Breast cancer. EXAM: MRI FACE TRIGEMINAL WITHOUT AND WITH CONTRAST TECHNIQUE: Multiplanar, multiecho pulse sequences of the face and surrounding structures, including thin slice imaging of the course of the Trigeminal Nerves, were obtained both before and after administration of intravenous contrast. CONTRAST:  67mL MULTIHANCE GADOBENATE DIMEGLUMINE 529 MG/ML IV SOLN COMPARISON:  MRI brain 02/06/2015. FINDINGS: Targeted IAC protocol was employed. Only portions of the brain are included in the examination. Normal brainstem anatomy. Normal course of the fifth nerve on the RIGHT and LEFT into the cavernous sinus. There is no cavernous sinus mass, vascular loop, or trigeminal nerve enlargement or enhancement. No skull base abnormality is seen. In the infratemporal fossa, there is no evidence for perineural tumor spread, or worrisome soft tissue/osseous lesion. Negative orbits and sinuses. Negative visualized adjacent brain, including temporal lobes and middle cranial fossa. Flow voids are maintained and appear normal in the carotid and basilar arteries. Normal post infusion appearance to the pituitary, pituitary stalk, chiasm, and cavernous sinuses. No extra-axial mass such as meningioma or concern for leptomeningeal/pachymeningeal disease. No abnormal enhancement of the regional cerebral or brainstem  structures. IMPRESSION: Negative MRI of the brain with trigeminal protocol. No structural abnormality of the trigeminal nerve is seen. No secondary cause for trigeminal neuralgia is observed. Electronically Signed   By: Staci Righter M.D.   On: 02/27/2018 14:19    Recent Labs: Lab Results  Component Value Date   WBC 3.6 (L) 09/06/2017   HGB 12.6 09/06/2017   PLT 237 09/06/2017   NA 141 09/06/2017   K 3.7 09/06/2017   CL 104 09/06/2017   CO2 28 09/06/2017   GLUCOSE 90 09/06/2017   BUN 14 09/06/2017   CREATININE 0.87 09/06/2017   BILITOT 0.6 09/06/2017   ALKPHOS 54 10/11/2016   AST 22 09/06/2017   ALT 18 09/06/2017   PROT 7.5 09/06/2017   ALBUMIN 4.3 10/11/2016   CALCIUM 9.7 09/06/2017   GFRAA 88 09/06/2017    Speciality Comments: No specialty comments available.  Procedures:  No procedures performed Allergies: Adhesive [tape]; Other; and Nickel   Assessment / Plan:     Visit Diagnoses: No diagnosis found.  Orders: No orders of the defined types were placed in this encounter.  No orders of the defined types were placed in this encounter.   Face-to-face time spent with patient was *** minutes. Greater than 50% of time was spent in counseling and coordination of care.  Follow-Up Instructions: No follow-ups on file.   Earnestine Mealing, CMA  Note - This record has been created using Editor, commissioning.  Chart creation errors have been sought, but may not always  have been located. Such creation errors do not reflect on  the standard of medical care.

## 2018-03-28 ENCOUNTER — Ambulatory Visit: Payer: BLUE CROSS/BLUE SHIELD | Admitting: Physician Assistant

## 2018-04-14 ENCOUNTER — Telehealth: Payer: Self-pay | Admitting: Hematology and Oncology

## 2018-04-14 NOTE — Telephone Encounter (Signed)
VG call day 9/26 - moved f/u to AM. Spoke with patient.

## 2018-04-24 ENCOUNTER — Telehealth: Payer: Self-pay | Admitting: Hematology and Oncology

## 2018-04-24 ENCOUNTER — Inpatient Hospital Stay: Payer: BLUE CROSS/BLUE SHIELD | Attending: Hematology and Oncology | Admitting: Hematology and Oncology

## 2018-04-24 DIAGNOSIS — Z170 Estrogen receptor positive status [ER+]: Secondary | ICD-10-CM | POA: Diagnosis not present

## 2018-04-24 DIAGNOSIS — Z9221 Personal history of antineoplastic chemotherapy: Secondary | ICD-10-CM

## 2018-04-24 DIAGNOSIS — Z923 Personal history of irradiation: Secondary | ICD-10-CM | POA: Diagnosis not present

## 2018-04-24 DIAGNOSIS — Z79810 Long term (current) use of selective estrogen receptor modulators (SERMs): Secondary | ICD-10-CM | POA: Diagnosis not present

## 2018-04-24 DIAGNOSIS — C50511 Malignant neoplasm of lower-outer quadrant of right female breast: Secondary | ICD-10-CM | POA: Diagnosis not present

## 2018-04-24 NOTE — Progress Notes (Signed)
Patient Care Team: Tina Kern, DO as PCP - General (Family Medicine) Tina [name], Tina Goodpasture, MD as Consulting Physician (Emergency Medicine) Tina Lark, MD as Consulting Physician (Hematology and Oncology) Tina Hamman, MD (Inactive) as Consulting Physician (Obstetrics and Gynecology) Tina Merino, MD as Consulting Physician (Rheumatology) Tina Sprang, MD as Consulting Physician (Neurology) Tina Lose, MD as Consulting Physician (Hematology and Oncology)  DIAGNOSIS:  Encounter Diagnosis  Name Primary?  . Malignant neoplasm of lower-outer quadrant of right breast of female, estrogen receptor positive ([place])     SUMMARY OF ONCOLOGIC HISTORY:   Breast cancer of lower-outer quadrant of right female breast ([place])   09/03/2013 Initial Biopsy    Right breast needle core biopsy at 8 o'clock position: IDC with DCIS, grade 3.  ER+ (weak 7%), PR+ (weak 6%), HER2 positive (ratio 2.22, copy #3.55). Ki67 is 70%.     09/14/2013 Breast MRI    lower outer right breast 2.2 x 1.3 x 1.5 cm Irregular mass. In the upper outer left breast there was a 7 x 5 x 8 mm indeterminate enhancing mass. Also another 9 x 7 x 6 mm indeterminant enhancing mass at 9:00 in the left breast was noted    09/15/2013 Clinical Stage    T2Nx; Stage IIA, right breast invasive ductal carcinoma, grade 3, ER positive, HER-2/neu positive     09/16/2013 Procedure    Left breast needle core biopsy at 2 o'clock position: Benign.  Right axillary LN biopsy: Benign     09/29/2013 Echocardiogram    Pre-treatment EF: 65-70%    10/02/2013 - 01/18/2014 Neo-Adjuvant Chemotherapy    Docetaxel, Carboplatin, Trastuzumab, Pertuzumab X 4 cycles; Taxotere stopped for fluid retention replaced with Gemcitabine 800 mg/m2 and completed 2 more cycles of chemo;  (TCH-P x 4 cycles), (GCH-P x 2 cycles); Zolodex Q 4 weeks    11/16/2013 Procedure    Genetic testing negative.  Genes tested include BRCA1, BRCA2.     03/01/2014 Definitive Surgery   Right lumpectomy with SLNB Tina [name]) revealed high grade invasive carcinosarcoma [facility] w/malignant heterologous differentiation) spanning 0.6cm. Negative margins. 3 right axillary SLN (-). HER2 repeated & negative (ratio 0.71, copy #1.75).    03/01/2014 Pathologic Stage    ypT1b, pN0, pMX ; Stage IA    04/27/2014 - 06/07/2014 Radiation Therapy    Adjuvant radiation therapy completed Tina [name]). Right breast: total dose 50 Gy over 25 fractions.  Right breast boost: total dose 10 Gy over 5 fractions.     07/06/2014 Echocardiogram    Most recent EF: 60-65%. Being followed in [place] clinic.     07/09/2014 Surgery    Bilat salpingo-oopherectomy and lysis of adhesions; surgical path benign. Uterus and cervix remain intact.     07/28/2014 - 11/16/2014 Anti-estrogen oral therapy    Anastrozole 1 mg daily started.  Planned duration of treatment: 5 years.  Anastrazole stopped 10/2014 d/t pain in hands and feet. Switched to Tamoxifen.      - 10/20/2014 Chemotherapy    1 year maintenance Herceptin completed     11/16/2014 - 01/24/2015 Anti-estrogen oral therapy    Tamoxifen 54m daily stopped due to persistent myalgias arthralgias and hot flashes    11/18/2014 Survivorship    Survivorship Care Plan given and reviewed with pt in-person.     01/24/2015 -  Anti-estrogen oral therapy    Exemestane 25 mg once daily, switched to tamoxifen 20 mg daily 10/24/2015     CHIEF COMPLIANT: Follow-up on tamoxifen therapy  INTERVAL HISTORY: Tina [name] is a 57 year old with above-mentioned history of HER-2 positive ER positive breast cancer is currently on oral antiestrogen therapy.  She is currently on tamoxifen but because of joint pain she stopped it recently.  She denies any pain lumps or nodules in the breast.  REVIEW OF SYSTEMS:   Constitutional: Denies fevers, chills or abnormal weight loss Eyes: Denies blurriness of vision Ears, nose, mouth, throat, and face: Denies mucositis or sore throat Respiratory:  Denies cough, dyspnea or wheezes Cardiovascular: Denies palpitation, chest discomfort Gastrointestinal:  Denies nausea, heartburn or change in bowel habits Skin: Denies abnormal skin rashes Lymphatics: Denies new lymphadenopathy or easy bruising Neurological:Denies numbness, tingling or new weaknesses Behavioral/Psych: Mood is stable, no new changes  Extremities: No lower extremity edema Breast:  denies any pain or lumps or nodules in either breasts All other systems were reviewed with the patient and are negative.  I have reviewed the past medical history, past surgical history, social history and family history with the patient and they are unchanged from previous note.  ALLERGIES:  is allergic to adhesive [tape]; other; and nickel.  MEDICATIONS:  Current Outpatient Medications  Medication Sig Dispense Refill  . aspirin 81 MG tablet Take 81 mg by mouth daily.    Marland Kitchen b complex vitamins capsule Take 1 capsule by mouth daily.    Marland Kitchen BIOTIN 5000 PO Take 1 capsule by mouth daily.     . Calcium Carbonate-Vit D-Min (CALTRATE 600+D PLUS MINERALS PO) Take 1 tablet by mouth daily.    Tina [name] Calcium (STOOL SOFTENER PO) Take by mouth as needed.    . gabapentin (NEURONTIN) 600 MG tablet Take 1 tablet (600 mg total) by mouth 3 (three) times daily. 90 tablet 11  . ibuprofen (ADVIL,MOTRIN) 800 MG tablet Take 1 tablet (800 mg total) by mouth every 8 (eight) hours as needed. 21 tablet 0  . lisinopril-hydrochlorothiazide (PRINZIDE,ZESTORETIC) 20-12.5 MG tablet TAKE 1 TABLET DAILY (NEED APPOINTMENT) 90 tablet 0  . loratadine (CLARITIN) 10 MG tablet Take 10 mg by mouth daily.    . magic mouthwash w/lidocaine SOLN Take 5 mLs by mouth 3 (three) times daily as needed for mouth pain. 150 mL 0  . MAGNESIUM PO Take by mouth daily.    . methocarbamol (ROBAXIN) 500 MG tablet Take three times a day as needed for muscle spasms 30 tablet 11  . omeprazole (PRILOSEC) 40 MG capsule Take 1 capsule (40 mg total) by mouth  daily. 90 capsule 0  . Polyethylene Glycol 3350 (MIRALAX PO) Take by mouth as needed.    . SUMAtriptan (IMITREX) 25 MG tablet Take 1 tablet (25 mg total) by mouth every 2 (two) hours as needed for migraine. May repeat in 2 hours if needed. Do not take more then 2 tablets in 24 hours. 10 tablet 0  . TURMERIC PO Take by mouth daily.    Marland Kitchen venlafaxine XR (EFFEXOR-XR) 75 MG 24 hr capsule TAKE 1 CAPSULE DAILY WITH BREAKFAST 90 capsule 0  . VITAMIN E EX Apply topically. Vaginal suppository     No current facility-administered medications for this visit.    Facility-Administered Medications Ordered in Other Visits  Medication Dose Route Frequency Provider Last Rate Last Dose  . prochlorperazine (COMPAZINE) injection 10 mg  10 mg Intravenous Q6H PRN Gardenia Phlegm, NP   10 mg at 12/26/13 0917    PHYSICAL EXAMINATION: ECOG PERFORMANCE STATUS: 0 - Asymptomatic  Vitals:   04/24/18 0954  BP: 121/79  Pulse: 79  Resp: 18  Temp: 98.6 F (37 C)  SpO2: 98%   Filed Weights   04/24/18 0954  Weight: 178 lb 11.2 oz (81.1 kg)    GENERAL:alert, no distress and comfortable SKIN: skin color, texture, turgor are normal, no rashes or significant lesions EYES: normal, Conjunctiva are pink and non-injected, sclera clear OROPHARYNX:no exudate, no erythema and lips, buccal mucosa, and tongue normal  NECK: supple, thyroid normal size, non-tender, without nodularity LYMPH:  no palpable lymphadenopathy in the cervical, axillary or inguinal LUNGS: clear to auscultation and percussion with normal breathing effort HEART: regular rate & rhythm and no murmurs and no lower extremity edema ABDOMEN:abdomen soft, non-tender and normal bowel sounds MUSCULOSKELETAL:no cyanosis of digits and no clubbing  NEURO: alert & oriented x 3 with fluent speech, no focal motor/sensory deficits EXTREMITIES: No lower extremity edema BREAST: No palpable masses or nodules in either right or left breasts. No palpable axillary  supraclavicular or infraclavicular adenopathy no breast tenderness or nipple discharge. (exam performed in the presence of a chaperone)  LABORATORY DATA:  I have reviewed the data as listed CMP Latest Ref Rng & Units 09/06/2017 03/29/2017 10/11/2016  Glucose 65 - 99 mg/dL 90 90 77  BUN 7 - 25 mg/dL '14 15 8  ' Creatinine 0.50 - 1.05 mg/dL 0.87 1.03 0.94  Sodium 135 - 146 mmol/L 141 138 140  Potassium 3.5 - 5.3 mmol/L 3.7 3.9 3.9  Chloride 98 - 110 mmol/L 104 99 102  CO2 20 - 32 mmol/L 28 32 25  Calcium 8.6 - 10.4 mg/dL 9.7 9.7 9.7  Total Protein 6.1 - 8.1 g/dL 7.5 - 7.7  Total Bilirubin 0.2 - 1.2 mg/dL 0.6 - 0.6  Alkaline Phos 33 - 130 U/L - - 54  AST 10 - 35 U/L 22 - 21  ALT 6 - 29 U/L 18 - 15    Lab Results  Component Value Date   WBC 3.6 (L) 09/06/2017   HGB 12.6 09/06/2017   HCT 37.3 09/06/2017   MCV 88.8 09/06/2017   PLT 237 09/06/2017   NEUTROABS 1,508 09/06/2017    ASSESSMENT & PLAN:  Breast cancer of lower-outer quadrant of right female breast Right breast T2N0 IIA invasive ductal carcinoma, grade 3, ER 7% PR 6%, Ki-67 70%, HER-2/neu positive. Status post neoadjuvant [place] for just started 10/02/2013 4 cycles stopped due to neuropathy and fluid retention, gemcitabine carboplatin Herceptin Perjeta 2 with Zoladex to 28 days status post lumpectomy 03/01/2014 residual 0.6 cm tumor grade 3. Completed radiation therapy November 2015  Antiestrogen therapy:  1. Initially given Anastrozole 1 mg daily (Herceptin maintenance completed 10/20/2014) 2. Switch to tamoxifen March 2016 3. Switch to exemestane 01/24/2015 (due to muscle aches, hot flashes) 4. Switched back to tamoxifen 10/24/2015 ( due to less side effect comparatively to AI therapy)  Tamoxifen toxicities: Hot flashes and joint pains She stopped taking tamoxifen recently.  I encouraged her to take at least half a tablet daily.  She is willing to consider that.  Breast cancer surveillance: 1. Mammogram 09/09/2017 no  evidence of malignancy breast density category B 2. Breast exam 04/24/2018: Benign  Return to clinic in 1 for follow-up    No orders of the defined types were placed in this encounter.  The patient has a good understanding of the overall plan. she agrees with it. she will call with any problems that may develop before the next visit here.   Harriette Ohara, MD 04/24/18

## 2018-04-24 NOTE — Assessment & Plan Note (Signed)
Right breast T2N0 IIA invasive ductal carcinoma, grade 3, ER 7% PR 6%, Ki-67 70%, HER-2/neu positive. Status post neoadjuvant [place] for just started 10/02/2013 4 cycles stopped due to neuropathy and fluid retention, gemcitabine carboplatin Herceptin Perjeta 2 with Zoladex to 28 days status post lumpectomy 03/01/2014 residual 0.6 cm tumor grade 3. Completed radiation therapy November 2015  Antiestrogen therapy:  1. Initially given Anastrozole 1 mg daily (Herceptin maintenance completed 10/20/2014) 2. Switch to tamoxifen March 2016 3. Switch to exemestane 01/24/2015 (due to muscle aches, hot flashes) 4. Switched back to tamoxifen 10/24/2015 ( due to less side effect comparatively to AI therapy)  Tamoxifen toxicities: Hot flashes  Breast cancer surveillance: 1. Mammogram 09/09/2017 no evidence of malignancy breast density category B 2. Breast exam 04/24/2018: Benign  Return to clinic in 1 for follow-up

## 2018-04-24 NOTE — Telephone Encounter (Signed)
Gave patient avs and calendar.   °

## 2018-05-17 ENCOUNTER — Other Ambulatory Visit: Payer: Self-pay | Admitting: Hematology and Oncology

## 2018-05-17 ENCOUNTER — Other Ambulatory Visit: Payer: Self-pay | Admitting: Family Medicine

## 2018-05-17 DIAGNOSIS — C50511 Malignant neoplasm of lower-outer quadrant of right female breast: Secondary | ICD-10-CM

## 2018-06-08 ENCOUNTER — Other Ambulatory Visit: Payer: Self-pay | Admitting: Family Medicine

## 2018-08-11 ENCOUNTER — Other Ambulatory Visit: Payer: Self-pay | Admitting: Hematology and Oncology

## 2018-08-11 DIAGNOSIS — Z853 Personal history of malignant neoplasm of breast: Secondary | ICD-10-CM

## 2018-08-26 NOTE — Progress Notes (Deleted)
Office Visit Note  Patient: Tina [name]             Date of Birth: 1960/09/22           MRN: [id number]             PCP: [name], DO Referring: [name], DO Visit Date: 09/09/2018 Occupation: @GUAROCC @  Subjective:  No chief complaint on file.   History of Present Illness: Tina [name] is a 58 y.o. female ***   Activities of Daily Living:  Patient reports morning stiffness for *** {minute/hour:19697}.   Patient {ACTIONS;DENIES/REPORTS:21021675::"Denies"} nocturnal pain.  Difficulty dressing/grooming: {ACTIONS;DENIES/REPORTS:21021675::"Denies"} Difficulty climbing stairs: {ACTIONS;DENIES/REPORTS:21021675::"Denies"} Difficulty getting out of chair: {ACTIONS;DENIES/REPORTS:21021675::"Denies"} Difficulty using hands for taps, buttons, cutlery, and/or writing: {ACTIONS;DENIES/REPORTS:21021675::"Denies"}  No Rheumatology ROS completed.   PMFS History:  Patient Active Problem List   Diagnosis Date Noted  . Fibromyalgia 02/28/2016  . Chronic low back pain 01/24/2016  . Lupus ([place]) 04/19/2015  . GERD (gastroesophageal reflux disease) 04/19/2015  . Cervicogenic headache 02/10/2015  . Neck pain 02/10/2015  . Hot flashes 10/26/2014  . GAD (generalized anxiety disorder) 10/26/2014  . Peripheral neuropathy due to chemotherapy ([place]) 03/12/2014  . Breast cancer of lower-outer quadrant of right female breast ([place]) 09/15/2013  . Left shoulder pain - seeing Dr. Lorin Mercy 08/20/2013  . Hypertension     Past Medical History:  Diagnosis Date  . Anxiety   . Breast cancer ([place]) 2015   lumpectomy  . Cancer of right breast ([place]) 09/03/13   Invasive Ductal Carcinoma/Ductal Carcinoma [facility]  biopsies  . Chronic low back pain 01/24/2016   -and R hip pain -seen by rheumatologist and ortho in the past -MRI with Dr. Lorin Mercy in 2015, R hip plain films in 2015 as well acupuncture, injections, tramadol not helpful trigger therapy [facility] and PT helpful   . Degenerative joint  disease    back neck and shoulder  . Fibromyalgia    Dr. Estanislado Pandy  . GERD (gastroesophageal reflux disease)    hx esophagitis  . Hot flashes   . Hypertension   . Leukopenia 06/16/2013  . Lupus [facility])    rheumatologist - Dr. Estanislado Pandy  . Migraines   . Neuromuscular disorder ([place])    neuropathy from chemo in feet/hands  . Personal history of chemotherapy    2015  . Personal history of radiation therapy 2015  . S/P radiation therapy 04/27/2014-06/07/2014   1) Right breast, 50 Gy in 25 fractions/ 2) Right breast boost, 10 Gy in 5 fractions    Family History  Problem Relation Age of Onset  . Hypertension Mother   . Dementia Mother        small vessel disease  . Cancer Mother 30       cervical  cancer  . Cancer Father 69       prostate ca  . Diabetes Father   . Arthritis Father   . Breast cancer Neg Hx    Past Surgical History:  Procedure Laterality Date  . BREAST BIOPSY Left 2015   benign core  . BREAST BIOPSY Right 2015   malignant core   . BREAST LUMPECTOMY Right   . BREAST SURGERY  03/01/2014   hx right breast cancer- 3 lymph nodes removed and lumpectomy  . [place]   rt  . CESAREAN SECTION     x 3  . PORTACATH PLACEMENT  08/2013   still currently active   . ROBOTIC ASSISTED BILATERAL SALPINGO OOPHERECTOMY  Bilateral 07/09/2014   Procedure: ROBOTIC ASSISTED BILATERAL SALPINGO OOPHORECTOM; :LYSIS OF ADHESIONS;  Surgeon: Marvene Staff, MD;  Location: [facility];  Service: Gynecology;  Laterality: Bilateral;  . TRIGGER FINGER RELEASE  2004   rt thumb  . trigger finger release left thumb  06/28/14   at [facility]  . TUBAL LIGATION    . WISDOM TOOTH EXTRACTION     Social History   Social History Narrative   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      Home Situation: lives with husband and 11 yo son      Spiritual Beliefs: Christian      Lifestyle: elliptical 3-4 times per week; working on diet as well over last year in 2014              Immunization History  Administered Date(s) Administered  . Influenza Split 06/16/2012  . Influenza,inj,Quad PF,6+ Mos 06/17/2013, 04/23/2014, 04/25/2015, 05/17/2017  . Pneumococcal Polysaccharide-23 02/16/2015     Objective: Vital Signs: LMP 09/28/2013 (Approximate)    Physical Exam   Musculoskeletal Exam: ***  CDAI Exam: CDAI Score: Not documented Patient Global Assessment: Not documented; Provider Global Assessment: Not documented Swollen: Not documented; Tender: Not documented Joint Exam   Not documented   There is currently no information documented on the homunculus. Go to the Rheumatology activity and complete the homunculus joint exam.  Investigation: No additional findings.  Imaging: No results found.  Recent Labs: Lab Results  Component Value Date   WBC 3.6 (L) 09/06/2017   HGB 12.6 09/06/2017   PLT 237 09/06/2017   NA 141 09/06/2017   K 3.7 09/06/2017   CL 104 09/06/2017   CO2 28 09/06/2017   GLUCOSE 90 09/06/2017   BUN 14 09/06/2017   CREATININE 0.87 09/06/2017   BILITOT 0.6 09/06/2017   ALKPHOS 54 10/11/2016   AST 22 09/06/2017   ALT 18 09/06/2017   PROT 7.5 09/06/2017   ALBUMIN 4.3 10/11/2016   CALCIUM 9.7 09/06/2017   GFRAA 88 09/06/2017    Speciality Comments: No specialty comments available.  Procedures:  No procedures performed Allergies: Adhesive [tape]; Other; and Nickel   Assessment / Plan:     Visit Diagnoses: No diagnosis found.   Orders: No orders of the defined types were placed in this encounter.  No orders of the defined types were placed in this encounter.   Face-to-face time spent with patient was *** minutes. Greater than 50% of time was spent in counseling and coordination of care.  Follow-Up Instructions: No follow-ups on file.   Earnestine Mealing, CMA  Note - This record has been created using Editor, commissioning.  Chart creation errors have been sought, but may not always  have been located. Such creation  errors do not reflect on  the standard of medical care.

## 2018-09-04 ENCOUNTER — Encounter: Payer: Self-pay | Admitting: Internal Medicine

## 2018-09-04 ENCOUNTER — Ambulatory Visit: Payer: Self-pay

## 2018-09-04 ENCOUNTER — Ambulatory Visit (INDEPENDENT_AMBULATORY_CARE_PROVIDER_SITE_OTHER): Payer: BLUE CROSS/BLUE SHIELD | Admitting: Internal Medicine

## 2018-09-04 VITALS — BP 120/80 | HR 99 | Temp 99.3°F | Wt 188.6 lb

## 2018-09-04 DIAGNOSIS — R6889 Other general symptoms and signs: Secondary | ICD-10-CM

## 2018-09-04 DIAGNOSIS — H66001 Acute suppurative otitis media without spontaneous rupture of ear drum, right ear: Secondary | ICD-10-CM | POA: Diagnosis not present

## 2018-09-04 LAB — POC INFLUENZA A&B (BINAX/QUICKVUE): Influenza A, POC: NEGATIVE

## 2018-09-04 MED ORDER — AMOXICILLIN-POT CLAVULANATE 875-125 MG PO TABS: 1.0000 | ORAL_TABLET | Freq: Two times a day (BID) | ORAL | 0 refills | Status: AC

## 2018-09-04 NOTE — Telephone Encounter (Signed)
Pt c/o dry cough, expiratory wheezing, "feels like equilibrium is "off", chest hurts with coughing, shoulders are sore, legs feel "achy". Pt denies SOB. Pt denies wheezing that can be heard across the room. Symptoms began with a cough yesterday evening. Pt denies fever.   Pt has a h/o wheezing that worsened and was seen at the ED and was given a nebulizer treatment and inhaler, steroids. Pt has not had international travel. Care advice given and pt verbalized understanding.  No appts available with PCP. Appt made with Dr Jerilee Hoh for 4:15 pm.  Reason for Disposition . [1] MILD difficulty breathing (e.g., minimal/no SOB at rest, SOB with walking, pulse <100) AND [2] NEW-onset or WORSE than normal  Additional Information . Commented on: Wheezing can be heard across the room    Having expiratory wheezing only but pt stated that cannot be heard across the room.  Answer Assessment - Initial Assessment Questions 1. RESPIRATORY STATUS: "Describe your breathing?" (e.g., wheezing, shortness of breath, unable to speak, severe coughing)      Wheezing, coughing, chest hurts with coughing 2. ONSET: "When did this breathing problem begin?"      Yesterday evening with a cough 3. PATTERN "Does the difficult breathing come and go, or has it been constant since it started?"      constant 4. SEVERITY: "How bad is your breathing?" (e.g., mild, moderate, severe)    - MILD: No SOB at rest, mild SOB with walking, speaks normally in sentences, can lay down, no retractions, pulse < 100.    - MODERATE: SOB at rest, SOB with minimal exertion and prefers to sit, cannot lie down flat, speaks in phrases, mild retractions, audible wheezing, pulse 100-120.    - SEVERE: Very SOB at rest, speaks in single words, struggling to breathe, sitting hunched forward, retractions, pulse > 120      No SOB 5. RECURRENT SYMPTOM: "Have you had difficulty breathing before?" If so, ask: "When was the last time?" and "What happened that  time?"      Yes-had an asthma attack- went to the ED and nebulizer and inhaler, steroids 6. CARDIAC HISTORY: "Do you have any history of heart disease?" (e.g., heart attack, angina, bypass surgery, angioplasty)     HTN 7. LUNG HISTORY: "Do you have any history of lung disease?"  (e.g., pulmonary embolus, asthma, emphysema)     1 previous episode of wheezing but was not diagnosed with asthma 8. CAUSE: "What do you think is causing the breathing problem?"      Pt does not know 9. OTHER SYMPTOMS: "Do you have any other symptoms? (e.g., dizziness, runny nose, cough, chest pain, fever)     Feels like equilibrium is off, cough, chest hurts with coughing, shoulders are sore and chest and legs are achy and sore 10. PREGNANCY: "Is there any chance you are pregnant?" "When was your last menstrual period?"       n/a 11. TRAVEL: "Have you traveled out of the country in the last month?" (e.g., travel history, exposures)       no  Protocols used: BREATHING DIFFICULTY-A-AH

## 2018-09-04 NOTE — Progress Notes (Signed)
Established Patient Office Visit     CC/Reason for Visit: flu like symptoms, right ear pain  HPI: Tina [name] is a 58 y.o. female who is coming in today for the above mentioned reasons.  The past 3 days she has been having headaches, body aches, no fevers, runny nose, no nasal congestion, she has some chest wall tightness with cough, cough is productive of white sputum.  All of her coworkers have been sick with a "cold".  She has not had any recent travel.  She has also been complaining of significant right ear pain and hearing loss in that ear.   Past Medical/Surgical History: Past Medical History:  Diagnosis Date  . Anxiety   . Breast cancer ([place]) 2015   lumpectomy  . Cancer of right breast ([place]) 09/03/13   Invasive Ductal Carcinoma/Ductal Carcinoma Insitu  biopsies  . Chronic low back pain 01/24/2016   -and R hip pain -seen by rheumatologist and ortho in the past -MRI with Dr. Lorin Mercy in 2015, R hip plain films in 2015 as well acupuncture, injections, tramadol not helpful trigger therapy [facility] and PT helpful   . Degenerative joint disease    back neck and shoulder  . Fibromyalgia    Dr. Estanislado Pandy  . GERD (gastroesophageal reflux disease)    hx esophagitis  . Hot flashes   . Hypertension   . Leukopenia 06/16/2013  . Lupus [facility])    rheumatologist - Dr. Estanislado Pandy  . Migraines   . 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 Personal history of chemotherapy    2015  . Personal history of radiation therapy 2015  . S/P radiation therapy 04/27/2014-06/07/2014   1) Right breast, 50 Gy in 25 fractions/ 2) Right breast boost, 10 Gy in 5 fractions    Past Surgical History:  Procedure Laterality Date  . BREAST BIOPSY Left 2015   benign core  . BREAST BIOPSY Right 2015   malignant core   . BREAST LUMPECTOMY Right   . BREAST SURGERY  03/01/2014   hx right breast cancer- 3 lymph nodes removed and lumpectomy  . [place]   rt  . CESAREAN SECTION     x 3  . PORTACATH PLACEMENT  08/2013   still currently active   . ROBOTIC ASSISTED BILATERAL SALPINGO OOPHERECTOMY Bilateral 07/09/2014   Procedure: ROBOTIC ASSISTED BILATERAL SALPINGO OOPHORECTOM; :LYSIS OF ADHESIONS;  Surgeon: Marvene Staff, MD;  Location: [facility];  Service: Gynecology;  Laterality: Bilateral;  . TRIGGER FINGER RELEASE  2004   rt thumb  . trigger finger release left thumb  06/28/14   at [facility]  . TUBAL LIGATION    . WISDOM TOOTH EXTRACTION      Social History:  reports that she quit smoking about 14 years ago. Her smoking use included cigars and cigarettes. She has a 15.00 pack-year smoking history. She has never used smokeless tobacco. She reports current alcohol use. She reports current drug use.  Allergies: Allergies  Allergen Reactions  . Adhesive [Tape] Itching    Burned and scarred skinned  . Other     Cholroprep-causes rash  . Nickel Rash    Family History:  Family History  Problem Relation Age of Onset  . Hypertension Mother   . Dementia Mother        small vessel disease  . Cancer Mother 30       cervical  cancer  .  Cancer Father 40       prostate ca  . Diabetes Father   . Arthritis Father   . Breast cancer Neg Hx      Current Outpatient Medications:  .  aspirin 81 MG tablet, Take 81 mg by mouth daily., Disp: , Rfl:  .  b complex vitamins capsule, Take 1 capsule by mouth daily., Disp: , Rfl:  .  BIOTIN 5000 PO, Take 1 capsule by mouth daily. , Disp: , Rfl:  .  Calcium Carbonate-Vit D-Min (CALTRATE 600+D PLUS MINERALS PO), Take 1 tablet by mouth daily., Disp: , Rfl:  .  Docusate Calcium (STOOL SOFTENER PO), Take by mouth as needed., Disp: , Rfl:  .  gabapentin (NEURONTIN) 600 MG tablet, Take 1 tablet (600 mg total) by mouth 3 (three) times daily., Disp: 90 tablet, Rfl: 11 .  ibuprofen (ADVIL,MOTRIN) 800 MG tablet, Take 1 tablet (800 mg total) by mouth every 8 (eight) hours as  needed., Disp: 21 tablet, Rfl: 0 .  lisinopril-hydrochlorothiazide (PRINZIDE,ZESTORETIC) 20-12.5 MG tablet, TAKE 1 TABLET DAILY (NEED APPOINTMENT), Disp: 90 tablet, Rfl: 0 .  loratadine (CLARITIN) 10 MG tablet, Take 10 mg by mouth daily., Disp: , Rfl:  .  magic mouthwash w/lidocaine SOLN, Take 5 mLs by mouth 3 (three) times daily as needed for mouth pain., Disp: 150 mL, Rfl: 0 .  MAGNESIUM PO, Take by mouth daily., Disp: , Rfl:  .  methocarbamol (ROBAXIN) 500 MG tablet, Take three times a day as needed for muscle spasms, Disp: 30 tablet, Rfl: 11 .  omeprazole (PRILOSEC) 40 MG capsule, TAKE 1 CAPSULE DAILY, Disp: 90 capsule, Rfl: 0 .  Polyethylene Glycol 3350 (MIRALAX PO), Take by mouth as needed., Disp: , Rfl:  .  SUMAtriptan (IMITREX) 25 MG tablet, Take 1 tablet (25 mg total) by mouth every 2 (two) hours as needed for migraine. May repeat in 2 hours if needed. Do not take more then 2 tablets in 24 hours., Disp: 10 tablet, Rfl: 0 .  TURMERIC PO, Take by mouth daily., Disp: , Rfl:  .  venlafaxine XR (EFFEXOR-XR) 75 MG 24 hr capsule, TAKE 2 CAPSULES BY MOUTH ONCE DAILY WITH  BREAKFAST., Disp: 60 capsule, Rfl: 0 .  VITAMIN E EX, Apply topically. Vaginal suppository, Disp: , Rfl:  .  amoxicillin-clavulanate (AUGMENTIN) 875-125 MG tablet, Take 1 tablet by mouth 2 (two) times daily for 7 days., Disp: 14 tablet, Rfl: 0 No current facility-administered medications for this visit.   Facility-Administered Medications Ordered in Other Visits:  .  prochlorperazine (COMPAZINE) injection 10 mg, 10 mg, Intravenous, Q6H PRN, Gardenia Phlegm, NP, 10 mg at 12/26/13 0923  Review of Systems:  Constitutional: Denies fever, chills, diaphoresis, appetite change and fatigue.  HEENT: Denies photophobia, eye pain, redness,  mouth sores, trouble swallowing, neck pain, neck stiffness and tinnitus.   Respiratory: Denies SOB, DOE, and wheezing.   Cardiovascular: Denies  palpitations and leg swelling.    Gastrointestinal: Denies nausea, vomiting, abdominal pain, diarrhea, constipation, blood in stool and abdominal distention.  Genitourinary: Denies dysuria, urgency, frequency, hematuria, flank pain and difficulty urinating.  Endocrine: Denies: hot or cold intolerance, sweats, changes in hair or nails, polyuria, polydipsia. Musculoskeletal: Denies myalgias, back pain, joint swelling, arthralgias and gait problem.  Skin: Denies pallor, rash and wound.  Neurological: Denies dizziness, seizures, syncope, weakness, light-headedness, numbness and headaches.  Hematological: Denies adenopathy. Easy bruising, personal or family bleeding history  Psychiatric/Behavioral: Denies suicidal ideation, mood changes, confusion, nervousness, sleep disturbance and agitation  Physical Exam: Vitals:   09/04/18 1613  BP: 120/80  Pulse: 99  Temp: 99.3 F (37.4 C)  TempSrc: Oral  SpO2: 97%  Weight: 188 lb 9.6 oz (85.5 kg)    Body mass index is 32.37 kg/m.   Constitutional: NAD, calm, comfortable Eyes: PERRL, lids and conjunctivae normal ENMT: Mucous membranes are moist. Posterior pharynx is erythematous but clear of any exudate or lesions. Normal dentition. Tympanic membrane is pearly white, no erythema or bulging on the left. On the right there is erythema, air fluid levels and bulging of the TM, no signs of perforation. Neck: normal, supple, no masses, no thyromegaly, no palpable lymphadenopathy Respiratory: clear to auscultation bilaterally, no wheezing, no crackles. Normal respiratory effort. No accessory muscle use.  Cardiovascular: Regular rate and rhythm, no murmurs / rubs / gallops. No extremity edema. 2+ pedal pulses. No carotid bruits.  Psychiatric: Normal judgment and insight. Alert and oriented x 3. Normal mood.    Impression and Plan:  Non-recurrent acute suppurative otitis media of right ear without spontaneous rupture of tympanic membrane  Flu-like symptoms   -Patient had a rapid  flu test on arrival that was negative. -Will send home today with a 7-day course of Augmentin for her acute right otitis media.  She has been asked to return if no significant improvement within 10 to 14 days.    Patient Instructions  -Nice seeing you today.  -Start taking Augmentin twice daily for 7 days for your right ear infection.  -Return if no improvement in 10-14 days.   Otitis Media, Adult  Otitis media means that the middle ear is red and swollen (inflamed) and full of fluid. The condition usually goes away on its own. Follow these instructions at home:  Take over-the-counter and prescription medicines only as told by your doctor.  If you were prescribed an antibiotic medicine, take it as told by your doctor. Do not stop taking the antibiotic even if you start to feel better.  Keep all follow-up visits as told by your doctor. This is important. Contact a doctor if:  You have bleeding from your nose.  There is a lump on your neck.  You are not getting better in 5 days.  You feel worse instead of better. Get help right away if:  You have pain that is not helped with medicine.  You have swelling, redness, or pain around your ear.  You get a stiff neck.  You cannot move part of your face (paralyzed).  You notice that the bone behind your ear hurts when you touch it.  You get a very bad headache. Summary  Otitis media means that the middle ear is red, swollen, and full of fluid.  This condition usually goes away on its own. In some cases, treatment may be needed.  If you were prescribed an antibiotic medicine, take it as told by your doctor. This information is not intended to replace advice given to you by your health care provider. Make sure you discuss any questions you have with your health care provider. Document Released: 01/02/2008 Document Revised: 08/06/2016 Document Reviewed: 08/06/2016 Elsevier Interactive Patient Education  2019 [place], MD [facility]

## 2018-09-04 NOTE — Patient Instructions (Signed)
-  Nice seeing you today.  -Start taking Augmentin twice daily for 7 days for your right ear infection.  -Return if no improvement in 10-14 days.   Otitis Media, Adult  Otitis media means that the middle ear is red and swollen (inflamed) and full of fluid. The condition usually goes away on its own. Follow these instructions at home:  Take over-the-counter and prescription medicines only as told by your doctor.  If you were prescribed an antibiotic medicine, take it as told by your doctor. Do not stop taking the antibiotic even if you start to feel better.  Keep all follow-up visits as told by your doctor. This is important. Contact a doctor if:  You have bleeding from your nose.  There is a lump on your neck.  You are not getting better in 5 days.  You feel worse instead of better. Get help right away if:  You have pain that is not helped with medicine.  You have swelling, redness, or pain around your ear.  You get a stiff neck.  You cannot move part of your face (paralyzed).  You notice that the bone behind your ear hurts when you touch it.  You get a very bad headache. Summary  Otitis media means that the middle ear is red, swollen, and full of fluid.  This condition usually goes away on its own. In some cases, treatment may be needed.  If you were prescribed an antibiotic medicine, take it as told by your doctor. This information is not intended to replace advice given to you by your health care provider. Make sure you discuss any questions you have with your health care provider. Document Released: 01/02/2008 Document Revised: 08/06/2016 Document Reviewed: 08/06/2016 Elsevier Interactive Patient Education  2019 [employer].

## 2018-09-05 ENCOUNTER — Ambulatory Visit: Payer: BLUE CROSS/BLUE SHIELD | Admitting: Neurology

## 2018-09-08 ENCOUNTER — Ambulatory Visit: Payer: Self-pay

## 2018-09-08 NOTE — Telephone Encounter (Signed)
Pt c/o fatigue since Friday. Pt stated that she "gets worn out if I go to the bathroom I have to go lay back down." Pt was concerned of her fatigue. Pt stated she has vomited x2 but is now able to tolerate fluids and small amounts of food. Pt had an OV 09/04/17 and was dx with negative flu (pt was having flu like symptoms) and right ear infection. Pt is taking Augmentin. Pt given home care advice and also advised if she is not better by Thursday, to call back and get an appointment for f/u. Care advice given and pt verbalized understanding.   Reason for Disposition . Mild weakness or fatigue with acute minor illness (e.g., colds)  Answer Assessment - Initial Assessment Questions 1. DESCRIPTION: "Describe how you are feeling."     No energy to do anything. Pt stated that she will get up to go to the bathroom then will have to go lay down  2. SEVERITY: "How bad is it?"  "Can you stand and walk?"   - MILD - Feels weak or tired, but does not interfere with work, school or normal activities   - [place] to stand and walk; weakness interferes with work, school, or normal activities   - SEVERE - Unable to stand or walk     moderate 3. ONSET:  "When did the weakness begin?"     Friday 4. CAUSE: "What do you think is causing the weakness?"     Illness and ear infection 5. MEDICINES: "Have you recently started a new medicine or had a change in the amount of a medicine?"     Just started on Augmentin for ear infection 6. OTHER SYMPTOMS: "Do you have any other symptoms?" (e.g., chest pain, fever, cough, SOB, vomiting, diarrhea, bleeding, other areas of pain)    congested cough, vomiting x2 and improving diarrhea, crampy abdominal pain, slight SOB 7. PREGNANCY: "Is there any chance you are pregnant?" "When was your last menstrual period?"     n/a  Protocols used: WEAKNESS (GENERALIZED) AND FATIGUE-A-AH

## 2018-09-08 NOTE — Progress Notes (Signed)
Office Visit Note  Patient: Tina [name]             Date of Birth: 22-Apr-1961           MRN: [id number]             PCP: [name], DO Referring: [name], DO Visit Date: 09/17/2018 Occupation: @GUAROCC @  Subjective:  Muscle tenderness   History of Present Illness: Tina [name] is a 58 y.o. female with history of autoimmune disease, fibromyalgia, osteoarthritis, and DDD.  She denies any recent signs or symptoms of an autoimmune flare.  She denies any recent rashes.  She denies any sores in mouth or nose.  She denies any photosensitivity.   She denies any symptoms of Raynaud's and has no ulcerations.  She denies any swollen lymph nodes or fevers.  She has very mild sicca symptoms.  She denies any hair loss.  She reports her fatigue has been stable.  She states that she has been sleeping 9 to 12 hours per night.  She continues have some generalized muscle aches muscle tenderness due to fibromyalgia.  She denies any joint pain or joint swelling at this time.  She reports that she has been feeling well off of Plaquenil.  She has no concerns at this time.   Activities of Daily Living:  Patient reports morning stiffness all day.   Patient Reports nocturnal pain.  Difficulty dressing/grooming: Denies Difficulty climbing stairs: Denies Difficulty getting out of chair: Denies Difficulty using hands for taps, buttons, cutlery, and/or writing: Denies  Review of Systems  Constitutional: Positive for fatigue.  HENT: Negative for mouth sores, mouth dryness and nose dryness.   Eyes: Positive for dryness. Negative for pain and visual disturbance.  Respiratory: Negative for cough, hemoptysis, shortness of breath and difficulty breathing.   Cardiovascular: Negative for chest pain, palpitations, hypertension and swelling in legs/feet.  Gastrointestinal: Negative for blood in stool, constipation and diarrhea.  Endocrine: Negative for excessive thirst and increased urination.    Genitourinary: Negative for difficulty urinating and painful urination.  Musculoskeletal: Positive for morning stiffness. Negative for arthralgias, joint pain, joint swelling, myalgias, muscle weakness, muscle tenderness and myalgias.  Skin: Negative for color change, pallor, rash, hair loss, nodules/bumps, skin tightness, ulcers and sensitivity to sunlight.  Allergic/Immunologic: Negative for susceptible to infections.  Neurological: Negative for dizziness, numbness, headaches and weakness.  Hematological: Negative for bruising/bleeding tendency and swollen glands.  Psychiatric/Behavioral: Negative for depressed mood and sleep disturbance. The patient is not nervous/anxious.     PMFS History:  Patient Active Problem List   Diagnosis Date Noted  . Fibromyalgia 02/28/2016  . Chronic low back pain 01/24/2016  . Lupus ([place]) 04/19/2015  . GERD (gastroesophageal reflux disease) 04/19/2015  . Cervicogenic headache 02/10/2015  . Neck pain 02/10/2015  . Hot flashes 10/26/2014  . GAD (generalized anxiety disorder) 10/26/2014  . Peripheral neuropathy due to chemotherapy ([place]) 03/12/2014  . Breast cancer of lower-outer quadrant of right female breast ([place]) 09/15/2013  . Left shoulder pain - seeing Dr. Lorin Mercy 08/20/2013  . Hypertension     Past Medical History:  Diagnosis Date  . Anxiety   . Breast cancer ([place]) 2015   Right Breast Cancer  . Cancer of right breast ([place]) 09/03/13   Invasive Ductal Carcinoma/Ductal Carcinoma Insitu  biopsies  . Chronic low back pain 01/24/2016   -and R hip pain -seen by rheumatologist and ortho in the past -MRI with Dr. Lorin Mercy in 2015, R hip plain films in  2015 as well acupuncture, injections, tramadol not helpful [facility] and PT helpful   . Degenerative joint disease    back neck and shoulder  . Fibromyalgia    Dr. Estanislado Pandy  . GERD (gastroesophageal reflux disease)    hx esophagitis  . Hot flashes   . Hypertension   . Leukopenia  06/16/2013  . Lupus [facility])    rheumatologist - Dr. Estanislado Pandy  . Migraines   . Neuromuscular disorder ([place])    neuropathy from chemo in feet/hands  . Personal history of chemotherapy    2015  . Personal history of radiation therapy 2015   Right Breast Cancer  . S/P radiation therapy 04/27/2014-06/07/2014   1) Right breast, 50 Gy in 25 fractions/ 2) Right breast boost, 10 Gy in 5 fractions    Family History  Problem Relation Age of Onset  . Hypertension Mother   . Dementia Mother        small vessel disease  . Cancer Mother 30       cervical  cancer  . Cancer Father 73       prostate ca  . Diabetes Father   . Arthritis Father   . Breast cancer Neg Hx    Past Surgical History:  Procedure Laterality Date  . BREAST BIOPSY Left 2015   benign core  . BREAST BIOPSY Right 2015   malignant core   . BREAST LUMPECTOMY Right 2015  . BREAST SURGERY  03/01/2014   hx right breast cancer- 3 lymph nodes removed and lumpectomy  . [place]   rt  . CESAREAN SECTION     x 3  . PORTACATH PLACEMENT  08/2013   still currently active   . ROBOTIC ASSISTED BILATERAL SALPINGO OOPHERECTOMY Bilateral 07/09/2014   Procedure: ROBOTIC ASSISTED BILATERAL SALPINGO OOPHORECTOM; :LYSIS OF ADHESIONS;  Surgeon: Marvene Staff, MD;  Location: [facility];  Service: Gynecology;  Laterality: Bilateral;  . TRIGGER FINGER RELEASE  2004   rt thumb  . trigger finger release left thumb  06/28/14   [facility]  . TUBAL LIGATION    . WISDOM TOOTH EXTRACTION     Social History   Social History Narrative   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      Home Situation: lives with husband and 28 yo son      Spiritual Beliefs: Christian      Lifestyle: elliptical 3-4 times per week; working on diet as well over last year in 2014            Immunization History  Administered Date(s) Administered  . Influenza Split 06/16/2012  . Influenza,inj,Quad PF,6+ Mos 06/17/2013, 04/23/2014,  04/25/2015, 05/17/2017  . Pneumococcal Polysaccharide-23 02/16/2015     Objective: Vital Signs: BP 127/71 (BP Location: Left Arm, Patient Position: Sitting, Cuff Size: Normal)   Pulse 89   Resp 16   Ht 5\' 4"  (1.626 m)   Wt 183 lb 9.6 oz (83.3 kg)   LMP 09/28/2013 (Approximate)   BMI 31.51 kg/m    Physical Exam Vitals signs and nursing note reviewed.  Constitutional:      Appearance: She is well-developed.  HENT:     Head: Normocephalic and atraumatic.     Comments: No parotid swelling or tenderness    Nose:     Comments: No nasal ulcerations    Mouth/Throat:     Comments: No oral ulcerations Eyes:     Conjunctiva/sclera: Conjunctivae normal.  Neck:  Musculoskeletal: Normal range of motion.  Cardiovascular:     Rate and Rhythm: Normal rate and regular rhythm.     Heart sounds: Normal heart sounds.  Pulmonary:     Effort: Pulmonary effort is normal.     Breath sounds: Normal breath sounds.  Abdominal:     General: Bowel sounds are normal.     Palpations: Abdomen is soft.  Lymphadenopathy:     Cervical: No cervical adenopathy.  Skin:    General: Skin is warm and dry.     Capillary Refill: Capillary refill takes less than 2 seconds.     Comments: No malar rash.  No digital ulcerations or signs of gangrene.   Neurological:     Mental Status: She is alert and oriented to person, place, and time.  Psychiatric:        Behavior: Behavior normal.      Musculoskeletal Exam: C-spine, thoracic spine, lumbar spine good range of motion.  No midline spinal tenderness.  No SI joint tenderness.  Shoulder joints, elbow joints, wrist joints, MCPs, PIPs, DIPs good range of motion no synovitis.  Complete fist formation bilaterally.  Hip joints, knee joints, ankle joints, MTPs, PIPs, DIPs good range of motion no synovitis.  No warmth or effusion of bilateral knee joints.  No tenderness or swelling of ankle joints.  No tenderness over trochanteric bursa bilaterally.   CDAI  Exam: CDAI Score: Not documented Patient Global Assessment: Not documented; Provider Global Assessment: Not documented Swollen: Not documented; Tender: Not documented Joint Exam   Not documented   There is currently no information documented on the homunculus. Go to the Rheumatology activity and complete the homunculus joint exam.  Investigation: No additional findings.  Imaging: Mm Diag Breast Tomo Bilateral  Result Date: 09/10/2018 CLINICAL DATA:  58 year old female status post malignant right lumpectomy with radiation therapy and chemotherapy in 2015. No current symptoms. EXAM: DIGITAL DIAGNOSTIC BILATERAL MAMMOGRAM WITH CAD AND TOMO COMPARISON:  Previous exam(s). ACR Breast Density Category b: There are scattered areas of fibroglandular density. FINDINGS: Stable posttreatment changes are noted in the central right breast. No new or suspicious findings are identified in either breast. The parenchymal pattern is stable. Mammographic images were processed with CAD. IMPRESSION: 1. No mammographic evidence of malignancy in either breast. 2. Stable right breast posttreatment changes. RECOMMENDATION: Screening mammogram in one year.(Code:SM-B-01Y) I have discussed the findings and recommendations with the patient. Results were also provided in writing at the conclusion of the visit. If applicable, a reminder letter will be sent to the patient regarding the next appointment. BI-RADS CATEGORY  2: Benign. Electronically Signed   By: Kristopher Oppenheim M.D.   On: 09/10/2018 15:59    Recent Labs: Lab Results  Component Value Date   WBC 3.6 (L) 09/06/2017   HGB 12.6 09/06/2017   PLT 237 09/06/2017   NA 141 09/06/2017   K 3.7 09/06/2017   CL 104 09/06/2017   CO2 28 09/06/2017   GLUCOSE 90 09/06/2017   BUN 14 09/06/2017   CREATININE 0.87 09/06/2017   BILITOT 0.6 09/06/2017   ALKPHOS 54 10/11/2016   AST 22 09/06/2017   ALT 18 09/06/2017   PROT 7.5 09/06/2017   ALBUMIN 4.3 10/11/2016   CALCIUM  9.7 09/06/2017   GFRAA 88 09/06/2017    Speciality Comments: No specialty comments available.  Procedures:  No procedures performed Allergies: Adhesive [tape]; Other; and Nickel   Assessment / Plan:     Visit Diagnoses: Autoimmune disease ([place]) - + ANA  1:320 Speck[place]-patient had past Hx of neutropenia, fatigue, oral ulcers, sicca symptoms, parotitis, Previously on PLQ. Asymptomatic at this time: She has not had any recent signs or symptoms of a flare.  She is clinically doing well off of Plaquenil.  She has no signs or symptoms of autoimmune disease at this time.  We will check her lab work today.  She was advised to notify us if she develops any new or worsening symptoms.  She will follow-up in the office in 6 months.  We will continue to check autoimmune labs on yearly basis.- Plan: CBC with Differential/Platelet, COMPLETE METABOLIC PANEL WITH GFR, Urinalysis, Routine w reflex microscopic, Anti-DNA antibody, double-stranded, C3 and C4, Sedimentation rate, ANA, VITAMIN D 25 Hydroxy (Vit-D Deficiency, Fractures), Cyclic citrul peptide antibody, IgG  Fibromyalgia: She continues to have mild generalized muscle aches and muscle tenderness.  Overall her fibromyalgia has been well controlled.  Her level of fatigue has been stable.  She has been sleeping well at night.  She typically sleeps 9 to 12 hours per night.  We discussed the importance of good sleep hygiene and regular exercise.  Cyclic citrullinated peptide (CCP) antibody positive -CCP will be rechecked today. Plan: Cyclic citrul peptide antibody, IgG  Primary osteoarthritis of both hands: She has no discomfort at this time.  She has complete fist formation bilaterally.  Joint protection and muscle strengthening were discussed.   Primary osteoarthritis of both hips: She has good ROM with no discomfort.   Primary osteoarthritis of both feet: She has no discomfort in her feet at this time.   DDD (degenerative disc disease), cervical: She has  good ROM with no discomfort.  She has no symptoms of radiculopathy at this time.   Osteopenia, unspecified location - We will check vitamin D level today. Plan: VITAMIN D 25 Hydroxy (Vit-D Deficiency, Fractures)  Other medical conditions are listed as follows:   Essential hypertension  Peripheral neuropathy due to chemotherapy (HCC)  Malignant neoplasm of lower-outer quadrant of right breast of female, estrogen receptor positive (HCC)  Gastroesophageal reflux disease, esophagitis presence not specified  Trapezius muscle spasm   Orders: Orders Placed This Encounter  Procedures  . CBC with Differential/Platelet  . COMPLETE METABOLIC PANEL WITH GFR  . Urinalysis, Routine w reflex microscopic  . Anti-DNA antibody, double-stranded  . C3 and C4  . Sedimentation rate  . ANA  . VITAMIN D 25 Hydroxy (Vit-D Deficiency, Fractures)  . Cyclic citrul peptide antibody, IgG   No orders of the defined types were placed in this encounter.   Follow-Up Instructions: Return in about 6 months (around 03/18/2019) for Autoimmune Disease, Fibromyalgia.   Ofilia Neas, PA-C   I examined and evaluated the patient with Tina Sams PA.  Patient continues to have some generalized pain from fibromyalgia and osteoarthritis.  Autoimmune disease seems to be stable.  She has been off Plaquenil.  We will obtain autoimmune labs today.  Will contact her with the lab results.  The plan of care was discussed as noted above.  [name], MD  Note - This record has been created using Editor, commissioning.  Chart creation errors have been sought, but may not always  have been located. Such creation errors do not reflect on  the standard of medical care.

## 2018-09-09 ENCOUNTER — Ambulatory Visit: Payer: BLUE CROSS/BLUE SHIELD | Admitting: Rheumatology

## 2018-09-10 ENCOUNTER — Ambulatory Visit
Admission: RE | Admit: 2018-09-10 | Discharge: 2018-09-10 | Disposition: A | Discharge status: Home / Self Care | Payer: BLUE CROSS/BLUE SHIELD | Source: Ambulatory Visit | Attending: Hematology and Oncology | Admitting: Hematology and Oncology

## 2018-09-10 DIAGNOSIS — Z853 Personal history of malignant neoplasm of breast: Secondary | ICD-10-CM | POA: Diagnosis not present

## 2018-09-10 DIAGNOSIS — R928 Other abnormal and inconclusive findings on diagnostic imaging of breast: Secondary | ICD-10-CM | POA: Diagnosis not present

## 2018-09-17 ENCOUNTER — Ambulatory Visit (INDEPENDENT_AMBULATORY_CARE_PROVIDER_SITE_OTHER): Payer: BLUE CROSS/BLUE SHIELD | Admitting: Rheumatology

## 2018-09-17 ENCOUNTER — Encounter: Payer: Self-pay | Admitting: Rheumatology

## 2018-09-17 VITALS — BP 127/71 | HR 89 | Resp 16 | Ht 64.0 in | Wt 183.6 lb

## 2018-09-17 DIAGNOSIS — M8580 Other specified disorders of bone density and structure, unspecified site: Secondary | ICD-10-CM

## 2018-09-17 DIAGNOSIS — T451X5A Adverse effect of antineoplastic and immunosuppressive drugs, initial encounter: Secondary | ICD-10-CM

## 2018-09-17 DIAGNOSIS — M19071 Primary osteoarthritis, right ankle and foot: Secondary | ICD-10-CM

## 2018-09-17 DIAGNOSIS — M19041 Primary osteoarthritis, right hand: Secondary | ICD-10-CM | POA: Diagnosis not present

## 2018-09-17 DIAGNOSIS — C50511 Malignant neoplasm of lower-outer quadrant of right female breast: Secondary | ICD-10-CM

## 2018-09-17 DIAGNOSIS — M359 Systemic involvement of connective tissue, unspecified: Secondary | ICD-10-CM

## 2018-09-17 DIAGNOSIS — R7989 Other specified abnormal findings of blood chemistry: Secondary | ICD-10-CM

## 2018-09-17 DIAGNOSIS — Z170 Estrogen receptor positive status [ER+]: Secondary | ICD-10-CM

## 2018-09-17 DIAGNOSIS — M797 Fibromyalgia: Secondary | ICD-10-CM

## 2018-09-17 DIAGNOSIS — M19042 Primary osteoarthritis, left hand: Secondary | ICD-10-CM

## 2018-09-17 DIAGNOSIS — R768 Other specified abnormal immunological findings in serum: Secondary | ICD-10-CM

## 2018-09-17 DIAGNOSIS — M62838 Other muscle spasm: Secondary | ICD-10-CM

## 2018-09-17 DIAGNOSIS — G620 Drug-induced polyneuropathy: Secondary | ICD-10-CM

## 2018-09-17 DIAGNOSIS — M160 Bilateral primary osteoarthritis of hip: Secondary | ICD-10-CM | POA: Diagnosis not present

## 2018-09-17 DIAGNOSIS — K219 Gastro-esophageal reflux disease without esophagitis: Secondary | ICD-10-CM

## 2018-09-17 DIAGNOSIS — I10 Essential (primary) hypertension: Secondary | ICD-10-CM | POA: Diagnosis not present

## 2018-09-17 DIAGNOSIS — M5030 Other cervical disc degeneration, unspecified cervical region: Secondary | ICD-10-CM | POA: Diagnosis not present

## 2018-09-17 DIAGNOSIS — M19072 Primary osteoarthritis, left ankle and foot: Secondary | ICD-10-CM

## 2018-09-25 DIAGNOSIS — M359 Systemic involvement of connective tissue, unspecified: Secondary | ICD-10-CM | POA: Diagnosis not present

## 2018-09-25 DIAGNOSIS — R5383 Other fatigue: Secondary | ICD-10-CM | POA: Diagnosis not present

## 2018-09-25 DIAGNOSIS — R7989 Other specified abnormal findings of blood chemistry: Secondary | ICD-10-CM | POA: Diagnosis not present

## 2018-09-25 DIAGNOSIS — M8580 Other specified disorders of bone density and structure, unspecified site: Secondary | ICD-10-CM | POA: Diagnosis not present

## 2018-09-29 ENCOUNTER — Encounter: Payer: Self-pay | Admitting: Hematology and Oncology

## 2018-09-29 DIAGNOSIS — Z1151 Encounter for screening for human papillomavirus (HPV): Secondary | ICD-10-CM | POA: Diagnosis not present

## 2018-09-29 DIAGNOSIS — Z6831 Body mass index (BMI) 31.0-31.9, adult: Secondary | ICD-10-CM | POA: Diagnosis not present

## 2018-09-29 DIAGNOSIS — Z01419 Encounter for gynecological examination (general) (routine) without abnormal findings: Secondary | ICD-10-CM | POA: Diagnosis not present

## 2018-09-29 LAB — CBC WITH DIFFERENTIAL/PLATELET
Absolute Monocytes: 198 cells/uL — ABNORMAL LOW (ref 200–950)
Basophils Absolute: 19 cells/uL (ref 0–200)
Basophils Relative: 0.6 %
EOS PCT: 0.9 %
Eosinophils Absolute: 29 cells/uL (ref 15–500)
HEMATOCRIT: 35.5 % (ref 35.0–45.0)
Hemoglobin: 12.2 g/dL (ref 11.7–15.5)
LYMPHS ABS: 1482 {cells}/uL (ref 850–3900)
MCH: 30 pg (ref 27.0–33.0)
MCHC: 34.4 g/dL (ref 32.0–36.0)
MCV: 87.4 fL (ref 80.0–100.0)
MPV: 10.2 fL (ref 7.5–12.5)
Monocytes Relative: 6.2 %
NEUTROS PCT: 46 %
Neutro Abs: 1472 cells/uL — ABNORMAL LOW (ref 1500–7800)
Platelets: 309 10*3/uL (ref 140–400)
RBC: 4.06 10*6/uL (ref 3.80–5.10)
RDW: 13 % (ref 11.0–15.0)
Total Lymphocyte: 46.3 %
WBC: 3.2 10*3/uL — AB (ref 3.8–10.8)

## 2018-09-29 LAB — ANTI-NUCLEAR AB-TITER (ANA TITER)
ANA TITER: 1:80 {titer} — ABNORMAL HIGH
ANA Titer 1: 1:320 {titer} — ABNORMAL HIGH

## 2018-09-29 LAB — VITAMIN D 25 HYDROXY (VIT D DEFICIENCY, FRACTURES): VIT D 25 HYDROXY: 54 ng/mL (ref 30–100)

## 2018-09-29 LAB — URINALYSIS, ROUTINE W REFLEX MICROSCOPIC
BILIRUBIN URINE: NEGATIVE
GLUCOSE, UA: NEGATIVE
Hgb urine dipstick: NEGATIVE
Ketones, ur: NEGATIVE
Leukocytes,Ua: NEGATIVE
Nitrite: NEGATIVE
Protein, ur: NEGATIVE
SPECIFIC GRAVITY, URINE: 1.016 (ref 1.001–1.03)
pH: 6 (ref 5.0–8.0)

## 2018-09-29 LAB — COMPLETE METABOLIC PANEL WITH GFR
AG Ratio: 1.1 (calc) (ref 1.0–2.5)
ALT: 17 U/L (ref 6–29)
AST: 25 U/L (ref 10–35)
Albumin: 3.9 g/dL (ref 3.6–5.1)
Alkaline phosphatase (APISO): 67 U/L (ref 37–153)
BUN: 12 mg/dL (ref 7–25)
CALCIUM: 9.5 mg/dL (ref 8.6–10.4)
CO2: 24 mmol/L (ref 20–32)
CREATININE: 0.97 mg/dL (ref 0.50–1.05)
Chloride: 104 mmol/L (ref 98–110)
GFR, EST AFRICAN AMERICAN: 76 mL/min/{1.73_m2} (ref >=60)
GFR, Est Non African American: 66 mL/min/{1.73_m2} (ref >=60)
GLOBULIN: 3.6 g/dL (ref 1.9–3.7)
Glucose, Bld: 141 mg/dL — ABNORMAL HIGH (ref 65–139)
Potassium: 3.7 mmol/L (ref 3.5–5.3)
Sodium: 138 mmol/L (ref 135–146)
Total Bilirubin: 0.4 mg/dL (ref 0.2–1.2)
Total Protein: 7.5 g/dL (ref 6.1–8.1)

## 2018-09-29 LAB — ANTI-DNA ANTIBODY, DOUBLE-STRANDED: DS DNA AB: 6 [IU]/mL — AB

## 2018-09-29 LAB — SEDIMENTATION RATE: Sed Rate: 28 mm/h (ref 0–30)

## 2018-09-29 LAB — CYCLIC CITRUL PEPTIDE ANTIBODY, IGG: CYCLIC CITRULLIN PEPTIDE AB: 55 U — AB

## 2018-09-29 LAB — C3 AND C4
C3 Complement: 152 mg/dL (ref 83–193)
C4 Complement: 24 mg/dL (ref 15–57)

## 2018-09-29 LAB — ANA: ANA: POSITIVE — AB

## 2018-09-29 NOTE — Progress Notes (Signed)
ANA is positive. 1:320 Mitotic, spindle fibers  1:640 NS 1:80 Cytoplasmic

## 2018-09-29 NOTE — Progress Notes (Signed)
She has chronic neutropenia. She is not on immunosuppressive agents at this time.  Glucose is elevated-141. Sed rate WNL.  Complements WNL. UA normal. DsDNA is 6 (indeterminate range). Vitamin D within desirable range. CCP is positive but trending down.   Please advise patient  to call us ASAP if she develops any signs or symptoms of a lupus flare.

## 2018-10-01 ENCOUNTER — Other Ambulatory Visit: Payer: Self-pay | Admitting: Family Medicine

## 2018-10-17 ENCOUNTER — Ambulatory Visit (INDEPENDENT_AMBULATORY_CARE_PROVIDER_SITE_OTHER): Payer: BC Managed Care – PPO | Admitting: *Deleted

## 2018-10-17 DIAGNOSIS — Z711 Person with feared health complaint in whom no diagnosis is made: Secondary | ICD-10-CM

## 2018-10-17 NOTE — Telephone Encounter (Signed)
Pt had questions regarding being immune to the virus after contracting it.  She was not tested for the virus but feels like she had it in February. She had the symptoms of fever, cough, shortness of breath, headache, body aches and chest congestion. Her questions answer. Will call back for other concerns.

## 2018-10-21 NOTE — Telephone Encounter (Signed)
Sent message to pt. No appt schedule 7 days prior to 24 hours after this communication. ~5-6 minutes spent on addressing this message and sending electronic message to the patient.

## 2018-10-30 ENCOUNTER — Telehealth: Payer: BC Managed Care – PPO | Admitting: Family

## 2018-10-30 DIAGNOSIS — J302 Other seasonal allergic rhinitis: Secondary | ICD-10-CM | POA: Diagnosis not present

## 2018-10-30 MED ORDER — LEVOCETIRIZINE DIHYDROCHLORIDE 5 MG PO TABS: 5.0000 mg | ORAL_TABLET | Freq: Every evening | ORAL | 0 refills | Status: DC

## 2018-10-30 MED ORDER — FLUTICASONE PROPIONATE 50 MCG/ACT NA SUSP: 2.0000 | Freq: Every day | NASAL | 0 refills | Status: DC

## 2018-10-30 NOTE — Progress Notes (Signed)
E visit for Allergic Rhinitis We are sorry that you are not feeling well.  Here is how we plan to help!  Based on what you have shared with me it looks like you have Allergic Rhinitis.  Rhinitis is when a reaction occurs that causes nasal congestion, runny nose, sneezing, and itching.  Most types of rhinitis are caused by an inflammation and are associated with symptoms in the eyes ears or throat. There are several types of rhinitis.  The most common are acute rhinitis, which is usually caused by a viral illness, allergic or seasonal rhinitis, and nonallergic or year-round rhinitis.  Nasal allergies occur certain times of the year.  Allergic rhinitis is caused when allergens in the air trigger the release of histamine in the body.  Histamine causes itching, swelling, and fluid to build up in the fragile linings of the nasal passages, sinuses and eyelids.  An itchy nose and clear discharge are common.  I recommend the following over the counter treatments: Xyzal 5 mg take 1 tablet daily  I also would recommend a nasal spray: Flonase 2 sprays into each nostril once daily   HOME CARE:   You can use an over-the-counter saline nasal spray as needed  Avoid areas where there is heavy dust, mites, or molds  Stay indoors on windy days during the pollen season  Keep windows closed in home, at least in bedroom; use air conditioner.  Use high-efficiency house air filter  Keep windows closed in car, turn AC on re-circulate  Avoid playing out with dog during pollen season  GET HELP RIGHT AWAY IF:   If your symptoms do not improve within 10 days  You become short of breath  You develop yellow or green discharge from your nose for over 3 days  You have coughing fits  MAKE SURE YOU:   Understand these instructions  Will watch your condition  Will get help right away if you are not doing well or get worse  Thank you for choosing an e-visit. Your e-visit answers were reviewed by a  board certified advanced clinical practitioner to complete your personal care plan. Depending upon the condition, your plan could have included both over the counter or prescription medications. Please review your pharmacy choice. Be sure that the pharmacy you have chosen is open so that you can pick up your prescription now.  If there is a problem you may message your provider in MyChart to have the prescription routed to another pharmacy. Your safety is important to us. If you have drug allergies check your prescription carefully.  For the next 24 hours, you can use MyChart to ask questions about today's visit, request a non-urgent call back, or ask for a work or school excuse from your e-visit provider. You will get an email in the next two days asking about your experience. I hope that your e-visit has been valuable and will speed your recovery.        

## 2018-11-07 ENCOUNTER — Ambulatory Visit: Payer: BLUE CROSS/BLUE SHIELD | Admitting: Neurology

## 2018-11-28 ENCOUNTER — Other Ambulatory Visit: Payer: Self-pay | Admitting: Family Medicine

## 2018-12-02 ENCOUNTER — Telehealth: Payer: Self-pay | Admitting: *Deleted

## 2018-12-02 ENCOUNTER — Ambulatory Visit (INDEPENDENT_AMBULATORY_CARE_PROVIDER_SITE_OTHER): Payer: BLUE CROSS/BLUE SHIELD | Admitting: Family Medicine

## 2018-12-02 ENCOUNTER — Encounter: Payer: Self-pay | Admitting: Family Medicine

## 2018-12-02 ENCOUNTER — Other Ambulatory Visit: Payer: Self-pay

## 2018-12-02 VITALS — Wt 182.0 lb

## 2018-12-02 DIAGNOSIS — K219 Gastro-esophageal reflux disease without esophagitis: Secondary | ICD-10-CM | POA: Diagnosis not present

## 2018-12-02 DIAGNOSIS — E669 Obesity, unspecified: Secondary | ICD-10-CM

## 2018-12-02 DIAGNOSIS — E785 Hyperlipidemia, unspecified: Secondary | ICD-10-CM

## 2018-12-02 DIAGNOSIS — I10 Essential (primary) hypertension: Secondary | ICD-10-CM | POA: Diagnosis not present

## 2018-12-02 DIAGNOSIS — R739 Hyperglycemia, unspecified: Secondary | ICD-10-CM

## 2018-12-02 DIAGNOSIS — R232 Flushing: Secondary | ICD-10-CM

## 2018-12-02 DIAGNOSIS — M329 Systemic lupus erythematosus, unspecified: Secondary | ICD-10-CM | POA: Diagnosis not present

## 2018-12-02 MED ORDER — VENLAFAXINE HCL ER 150 MG PO CP24
ORAL_CAPSULE | ORAL | 1 refills | Status: DC
Start: 2018-12-02 — End: 2019-05-05

## 2018-12-02 MED ORDER — LISINOPRIL-HYDROCHLOROTHIAZIDE 20-12.5 MG PO TABS: ORAL_TABLET | ORAL | 1 refills | Status: DC

## 2018-12-02 NOTE — Progress Notes (Signed)
Virtual Visit via Video Note  I connected with Tina [name] (Tina [name])  on 12/02/18 at  1:00 PM EDT by a video enabled telemedicine application and verified that I am speaking with the correct person using two identifiers.  Location patient: home Location provider:work or home office Persons participating in the virtual visit: patient, provider  I discussed the limitations of evaluation and management by telemedicine and the availability of in person appointments. The patient expressed understanding and agreed to proceed.   HPI:  Tina [name] is a pleasant 58 yo with a PMH significant for HTN, obesity, hyperglycemia, lupus, hyperlipidemia, seasonal allergies, hot flashes - takes effexor, history of breast ca, migraines, fibromyalgia, neuropathy from radiation and anxiety seen for follow up. She had extensive lab work not long ago in the end of February. It did not include a lipid or hgba1c and she is due for these. She reports currently working from home and trying to get some exercise - walks her dog 2 times per day for about 30 minutes. Tries to eat healthy but carbs are the problem she admits. Requests refill on BP medication and effexor which she finds very effective for hot flashes. Not on any medications for lupus. Has BP cuff, but reports is not working and agrees to get another cuff and send me home readings through [facility].  ROS: See pertinent positives and negatives per HPI.  Past Medical History:  Diagnosis Date  . Anxiety   . Breast cancer ([place]) 2015   Right Breast Cancer  . Cancer of right breast ([place]) 09/03/13   Invasive Ductal Carcinoma/Ductal Carcinoma Insitu  biopsies  . Chronic low back pain 01/24/2016   -and R hip pain -seen by rheumatologist and ortho in the past -MRI with Dr. Lorin Mercy in 2015, R hip plain films in 2015 as well acupuncture, injections, tramadol not helpful [facility] and PT helpful   . Degenerative joint disease    back neck and shoulder   . Fibromyalgia    Dr. Estanislado Pandy  . GERD (gastroesophageal reflux disease)    hx esophagitis  . Hot flashes   . Hypertension   . Leukopenia 06/16/2013  . Lupus [facility])    rheumatologist - Dr. Estanislado Pandy  . Migraines   . Neuromuscular disorder ([place])    neuropathy from chemo in feet/hands  . Personal history of chemotherapy    2015  . Personal history of radiation therapy 2015   Right Breast Cancer  . S/P radiation therapy 04/27/2014-06/07/2014   1) Right breast, 50 Gy in 25 fractions/ 2) Right breast boost, 10 Gy in 5 fractions    Past Surgical History:  Procedure Laterality Date  . BREAST BIOPSY Left 2015   benign core  . BREAST BIOPSY Right 2015   malignant core   . BREAST LUMPECTOMY Right 2015  . BREAST SURGERY  03/01/2014   hx right breast cancer- 3 lymph nodes removed and lumpectomy  . [place]   rt  . CESAREAN SECTION     x 3  . PORTACATH PLACEMENT  08/2013   still currently active   . ROBOTIC ASSISTED BILATERAL SALPINGO OOPHERECTOMY Bilateral 07/09/2014   Procedure: ROBOTIC ASSISTED BILATERAL SALPINGO OOPHORECTOM; :LYSIS OF ADHESIONS;  Surgeon: Marvene Staff, MD;  Location: [facility];  Service: Gynecology;  Laterality: Bilateral;  . TRIGGER FINGER RELEASE  2004   rt thumb  . trigger finger release left thumb  06/28/14   at [facility]  . TUBAL LIGATION    .  WISDOM TOOTH EXTRACTION      Family History  Problem Relation Age of Onset  . Hypertension Mother   . Dementia Mother        small vessel disease  . Cancer Mother 30       cervical  cancer  . Cancer Father 58       prostate ca  . Diabetes Father   . Arthritis Father   . Breast cancer Neg Hx     SOCIAL HX: see hpi   Current Outpatient Medications:  .  aspirin 81 MG tablet, Take 81 mg by mouth daily., Disp: , Rfl:  .  b complex vitamins capsule, Take 1 capsule by mouth daily., Disp: , Rfl:  .  BIOTIN 5000 PO, Take 1 capsule by mouth daily. , Disp: , Rfl:  .   Calcium Carbonate-Vit D-Min (CALTRATE 600+D PLUS MINERALS PO), Take 1 tablet by mouth daily., Disp: , Rfl:  .  gabapentin (NEURONTIN) 600 MG tablet, Take 1 tablet (600 mg total) by mouth 3 (three) times daily., Disp: 90 tablet, Rfl: 11 .  lisinopril-hydrochlorothiazide (PRINZIDE,ZESTORETIC) 20-12.5 MG tablet, TAKE 1 TABLET DAILY (NEED APPOINTMENT), Disp: 90 tablet, Rfl: 0 .  loratadine (CLARITIN) 10 MG tablet, Take 10 mg by mouth daily., Disp: , Rfl:  .  omeprazole (PRILOSEC) 40 MG capsule, TAKE 1 CAPSULE DAILY, Disp: 90 capsule, Rfl: 0 .  SUMAtriptan (IMITREX) 25 MG tablet, Take 1 tablet (25 mg total) by mouth every 2 (two) hours as needed for migraine. May repeat in 2 hours if needed. Do not take more then 2 tablets in 24 hours., Disp: 10 tablet, Rfl: 0 .  TURMERIC PO, Take by mouth daily., Disp: , Rfl:  .  venlafaxine XR (EFFEXOR-XR) 75 MG 24 hr capsule, TAKE 2 CAPSULES BY MOUTH ONCE DAILY WITH BREAKFAST **PATIENT  NEEDS  AN  APPOINTMENT**, Disp: 60 capsule, Rfl: 0 No current facility-administered medications for this visit.   Facility-Administered Medications Ordered in Other Visits:  .  prochlorperazine (COMPAZINE) injection 10 mg, 10 mg, Intravenous, Q6H PRN, Gardenia Phlegm, NP, 10 mg at 12/26/13 0962  EXAM:  VITALS per patient if applicable: Today's Vitals   12/02/18 1311  Weight: 182 lb (82.6 kg)   Body mass index is 31.24 kg/m.   GENERAL: alert, oriented, appears well and in no acute distress  HEENT: atraumatic, conjunttiva clear, no obvious abnormalities on inspection of external nose and ears  NECK: normal movements of the head and neck  LUNGS: on inspection no signs of respiratory distress, breathing rate appears normal, no obvious gross SOB, gasping or wheezing  CV: no obvious cyanosis  MS: moves all visible extremities without noticeable abnormality  PSYCH/NEURO: pleasant and cooperative, no obvious depression or anxiety, speech and thought processing  grossly intact  ASSESSMENT AND PLAN:  Discussed the following assessment and plan:  Essential hypertension  Obesity (BMI 30-39.9)  Hyperglycemia  Hyperlipidemia, unspecified hyperlipidemia type  Hot flashes  Gastroesophageal reflux disease, esophagitis presence not specified  Lupus ([place])  Needs a recheck on fasting hgba1c and lipids on review of other labs. Ordered and will have assistant help her arrange.  Medications refilled.   Lifestyle recommendations.  Advised to get new home BP cuff and monitor BP. She agrees to send me a Pharmacist, community message with these values to monitor her BP during the COVID 19 pandemic.  She will see Dr. Ethlyn Gallery for her next follow up, and then me.   I discussed the assessment and treatment plan with the  patient. The patient was provided an opportunity to ask questions and all were answered. The patient agreed with the plan and demonstrated an understanding of the instructions.   The patient was advised to call back or seek an in-person evaluation if the symptoms worsen or if the condition fails to improve as anticipated.    Follow up instructions: Advised assistant Wendie Simmer to help patient arrange the following: -lab visit in next 1 week for fasting labs -follow up/TOC with Dr. Ethlyn Gallery in 2-3 months -follow up with Dr. Maudie Mercury in 6 months  [name], DO

## 2018-12-02 NOTE — Telephone Encounter (Signed)
Per office notes from 5/5, I called the pt and scheduled a lab appt for 5/13.  Patient stated she will check her schedule and call back for a transfer of care visit and follow up.

## 2018-12-10 ENCOUNTER — Other Ambulatory Visit: Payer: Self-pay

## 2018-12-10 ENCOUNTER — Other Ambulatory Visit (INDEPENDENT_AMBULATORY_CARE_PROVIDER_SITE_OTHER): Payer: BLUE CROSS/BLUE SHIELD

## 2018-12-10 DIAGNOSIS — R739 Hyperglycemia, unspecified: Secondary | ICD-10-CM

## 2018-12-10 DIAGNOSIS — E785 Hyperlipidemia, unspecified: Secondary | ICD-10-CM | POA: Diagnosis not present

## 2018-12-10 LAB — LIPID PANEL
Cholesterol: 271 mg/dL — ABNORMAL HIGH (ref 0–200)
HDL: 67.1 mg/dL (ref >39.00)
LDL Cholesterol: 178 mg/dL — ABNORMAL HIGH (ref 0–99)
NonHDL: 204.27
Total CHOL/HDL Ratio: 4
Triglycerides: 133 mg/dL (ref 0.0–149.0)
VLDL: 26.6 mg/dL (ref 0.0–40.0)

## 2018-12-10 LAB — HEMOGLOBIN A1C: Hgb A1c MFr Bld: 6.5 % (ref 4.6–6.5)

## 2018-12-25 ENCOUNTER — Encounter: Payer: Self-pay | Admitting: Family Medicine

## 2018-12-25 ENCOUNTER — Other Ambulatory Visit: Payer: Self-pay

## 2018-12-25 ENCOUNTER — Ambulatory Visit (INDEPENDENT_AMBULATORY_CARE_PROVIDER_SITE_OTHER): Payer: BLUE CROSS/BLUE SHIELD | Admitting: Family Medicine

## 2018-12-25 VITALS — BP 123/78

## 2018-12-25 DIAGNOSIS — I10 Essential (primary) hypertension: Secondary | ICD-10-CM | POA: Diagnosis not present

## 2018-12-25 DIAGNOSIS — E785 Hyperlipidemia, unspecified: Secondary | ICD-10-CM

## 2018-12-25 DIAGNOSIS — R739 Hyperglycemia, unspecified: Secondary | ICD-10-CM | POA: Diagnosis not present

## 2018-12-25 MED ORDER — ROSUVASTATIN CALCIUM 5 MG PO TABS: 5.0000 mg | ORAL_TABLET | Freq: Every day | ORAL | 3 refills | Status: DC

## 2018-12-25 NOTE — Progress Notes (Signed)
Virtual Visit via Video Note  I connected with  on 12/25/18 at  1:20 PM EDT by a video enabled telemedicine application and verified that I am speaking with the correct person using two identifiers.  Location patient: home Location provider:work or home office Persons participating in the virtual visit: patient, provider  I discussed the limitations of evaluation and management by telemedicine and the availability of in person appointments. The patient expressed understanding and agreed to proceed.   HPI:  Follow up to discuss labs and BP. Labs showed increased hgba1c to mild diabetes. BP 120/70-80s. She walks some and is doing yoga - admits perhaps may not be aerobic. She admits to adding sweet tea on a regular basis the last few months and wonders if that could be causing the issues. No new symptoms.  ROS: See pertinent positives and negatives per HPI.  Past Medical History:  Diagnosis Date  . Anxiety   . Breast cancer ([place]) 2015   Right Breast Cancer  . Cancer of right breast ([place]) 09/03/13   Invasive Ductal Carcinoma/Ductal Carcinoma Insitu  biopsies  . Chronic low back pain 01/24/2016   -and R hip pain -seen by rheumatologist and ortho in the past -MRI with Dr. Lorin Mercy in 2015, R hip plain films in 2015 as well acupuncture, injections, tramadol not helpful trigger therapy at [facility] and PT helpful   . Degenerative joint disease    back neck and shoulder  . Fibromyalgia    Dr. Estanislado Pandy  . GERD (gastroesophageal reflux disease)    hx esophagitis  . Hot flashes   . Hypertension   . Leukopenia 06/16/2013  . Lupus [facility])    rheumatologist - Dr. Estanislado Pandy  . Migraines   . Neuromuscular disorder ([place])    neuropathy from chemo in feet/hands  . Personal history of chemotherapy    2015  . Personal history of radiation therapy 2015   Right Breast Cancer  . S/P radiation therapy 04/27/2014-06/07/2014   1) Right breast, 50 Gy in 25 fractions/ 2) Right breast boost, 10 Gy in  5 fractions    Past Surgical History:  Procedure Laterality Date  . BREAST BIOPSY Left 2015   benign core  . BREAST BIOPSY Right 2015   malignant core   . BREAST LUMPECTOMY Right 2015  . BREAST SURGERY  03/01/2014   hx right breast cancer- 3 lymph nodes removed and lumpectomy  . [place]   rt  . CESAREAN SECTION     x 3  . PORTACATH PLACEMENT  08/2013   still currently active   . ROBOTIC ASSISTED BILATERAL SALPINGO OOPHERECTOMY Bilateral 07/09/2014   Procedure: ROBOTIC ASSISTED BILATERAL SALPINGO OOPHORECTOM; :LYSIS OF ADHESIONS;  Surgeon: Marvene Staff, MD;  Location: [facility];  Service: Gynecology;  Laterality: Bilateral;  . TRIGGER FINGER RELEASE  2004   rt thumb  . trigger finger release left thumb  06/28/14   at orthopedic surgical center  . TUBAL LIGATION    . WISDOM TOOTH EXTRACTION      Family History  Problem Relation Age of Onset  . Hypertension Mother   . Dementia Mother        small vessel disease  . Cancer Mother 30       cervical  cancer  . Cancer Father 70       prostate ca  . Diabetes Father   . Arthritis Father   . Breast cancer Neg Hx     SOCIAL HX: see hpi  Current Outpatient Medications:  .  aspirin 81 MG tablet, Take 81 mg by mouth daily., Disp: , Rfl:  .  b complex vitamins capsule, Take 1 capsule by mouth daily., Disp: , Rfl:  .  BIOTIN 5000 PO, Take 1 capsule by mouth daily. , Disp: , Rfl:  .  Calcium Carbonate-Vit D-Min (CALTRATE 600+D PLUS MINERALS PO), Take 1 tablet by mouth daily., Disp: , Rfl:  .  gabapentin (NEURONTIN) 600 MG tablet, Take 1 tablet (600 mg total) by mouth 3 (three) times daily., Disp: 90 tablet, Rfl: 11 .  lisinopril-hydrochlorothiazide (ZESTORETIC) 20-12.5 MG tablet, TAKE 1 TABLET DAILY, Disp: 90 tablet, Rfl: 1 .  loratadine (CLARITIN) 10 MG tablet, Take 10 mg by mouth daily., Disp: , Rfl:  .  omeprazole (PRILOSEC) 40 MG capsule, TAKE 1 CAPSULE DAILY, Disp: 90 capsule, Rfl: 0 .  rosuvastatin  (CRESTOR) 5 MG tablet, Take 1 tablet (5 mg total) by mouth daily., Disp: 90 tablet, Rfl: 3 .  SUMAtriptan (IMITREX) 25 MG tablet, Take 1 tablet (25 mg total) by mouth every 2 (two) hours as needed for migraine. May repeat in 2 hours if needed. Do not take more then 2 tablets in 24 hours., Disp: 10 tablet, Rfl: 0 .  TURMERIC PO, Take by mouth daily., Disp: , Rfl:  .  venlafaxine XR (EFFEXOR-XR) 150 MG 24 hr capsule, Take one tablet daily., Disp: 90 capsule, Rfl: 1 No current facility-administered medications for this visit.   Facility-Administered Medications Ordered in Other Visits:  .  prochlorperazine (COMPAZINE) injection 10 mg, 10 mg, Intravenous, Q6H PRN, Gardenia Phlegm, NP, 10 mg at 12/26/13 2355  EXAM:  VITALS per patient if applicable: Today's Vitals   12/25/18 1327  BP: 123/78   There is no height or weight on file to calculate BMI.   GENERAL: alert, oriented, appears well and in no acute distress  HEENT: atraumatic, conjunttiva clear, no obvious abnormalities on inspection of external nose and ears  NECK: normal movements of the head and neck  LUNGS: on inspection no signs of respiratory distress, breathing rate appears normal, no obvious gross SOB, gasping or wheezing  CV: no obvious cyanosis  MS: moves all visible extremities without noticeable abnormality  PSYCH/NEURO: pleasant and cooperative, no obvious depression or anxiety, speech and thought processing grossly intact  ASSESSMENT AND PLAN:  Discussed the following assessment and plan:  Essential hypertension  Hyperglycemia  Hyperlipidemia, unspecified hyperlipidemia type  BP is ok - discussed blood pressure goals. Discussed implications of labs and treatment options. This last blood sugar had crept to the low diabetes range. She wants to try reverse this with diet and exercise. Discussed at length aerobic exercise goals and a healthy low sugar whole foods based diet with avoidance of processed  foods and foods with added sugar, sweets and excessive carbs. Opted to add a low dose statin for the cholesterol after discussion risks and benefits. Follow up 3 months.   I discussed the assessment and treatment plan with the patient. The patient was provided an opportunity to ask questions and all were answered. The patient agreed with the plan and demonstrated an understanding of the instructions.   The patient was advised to call back or seek an in-person evaluation if the symptoms worsen or if the condition fails to improve as anticipated.   Follow up instructions: Advised assistant Wendie Simmer to help patient arrange the following: -follow up with Dr. Maudie Mercury in 3 months  [name], DO

## 2018-12-29 ENCOUNTER — Telehealth: Payer: Self-pay | Admitting: *Deleted

## 2018-12-29 NOTE — Telephone Encounter (Signed)
I left a detailed message at the pts cell number to schedule an appt as below.

## 2018-12-29 NOTE — Telephone Encounter (Signed)
-----   Message from [name], DO sent at 12/25/2018  1:49 PM EDT ----- -follow up with Dr. Maudie Mercury in 3 months

## 2019-02-19 ENCOUNTER — Other Ambulatory Visit: Payer: Self-pay | Admitting: Hematology and Oncology

## 2019-02-19 DIAGNOSIS — C50511 Malignant neoplasm of lower-outer quadrant of right female breast: Secondary | ICD-10-CM

## 2019-03-04 NOTE — Progress Notes (Signed)
Office Visit Note  Patient: Tina [name]             Date of Birth: June 01, 1961           MRN: [id number]             PCP: [name], DO Referring: [name], DO Visit Date: 03/18/2019 Occupation: @GUAROCC @  Subjective:  Right wrist pain   History of Present Illness: Conda Wannamaker is a 58 y.o. female with history of autoimmune disease, fibromyalgia, and osteoarthritis.  She was previously taking Plaquenil but was taken off her due to being asymptomatic for a prolonged period of time.  Today she presents with right wrist pain and swelling that started about 1 month ago.  She denies any overuse activities or injuries.  She denies any numbness or tingling in her right hand.  She denies any pain in her fingers at this time.  She denies any other joint pain or joint swelling.  She continues to have trapezius muscle tension and muscle tenderness bilaterally.  She experiences occasional neck pain and stiffness.  She reports overall her fibromyalgia pain has been manageable.  She has been having worsening fatigue over the past several months.  She states that she has been sleeping well at night though.  She denies any sores in her mouth or nose.  She continues to have chronic sicca symptoms and uses over-the-counter products.  She denies any symptoms of Raynaud's.  She denies any photosensitivity or rashes.   Activities of Daily Living:  Patient reports joint stiffness all day Patient Reports nocturnal pain.  Difficulty dressing/grooming: Denies Difficulty climbing stairs: Denies Difficulty getting out of chair: Denies Difficulty using hands for taps, buttons, cutlery, and/or writing: Reports  Review of Systems  Constitutional: Positive for fatigue.  HENT: Positive for mouth dryness. Negative for mouth sores and nose dryness.   Eyes: Positive for dryness. Negative for pain and visual disturbance.  Respiratory: Negative for cough, hemoptysis, shortness of breath and difficulty  breathing.   Cardiovascular: Negative for chest pain, palpitations, hypertension and swelling in legs/feet.  Gastrointestinal: Negative for blood in stool, constipation and diarrhea.  Endocrine: Negative for increased urination.  Genitourinary: Negative for painful urination.  Musculoskeletal: Positive for arthralgias, joint pain, joint swelling, morning stiffness and muscle tenderness. Negative for myalgias, muscle weakness and myalgias.  Skin: Negative for color change, pallor, rash, hair loss, nodules/bumps, skin tightness, ulcers and sensitivity to sunlight.  Allergic/Immunologic: Negative for susceptible to infections.  Neurological: Negative for dizziness, numbness, headaches and weakness.  Hematological: Negative for swollen glands.  Psychiatric/Behavioral: Negative for depressed mood and sleep disturbance. The patient is not nervous/anxious.     PMFS History:  Patient Active Problem List   Diagnosis Date Noted   Fibromyalgia 02/28/2016   Chronic low back pain 01/24/2016   Lupus ([place]) 04/19/2015   GERD (gastroesophageal reflux disease) 04/19/2015   Cervicogenic headache 02/10/2015   Neck pain 02/10/2015   Hot flashes 10/26/2014   GAD (generalized anxiety disorder) 10/26/2014   Peripheral neuropathy due to chemotherapy ([place]) 03/12/2014   Breast cancer of lower-outer quadrant of right female breast ([place]) 09/15/2013   Left shoulder pain - seeing Dr. Lorin Mercy 08/20/2013   Hypertension     Past Medical History:  Diagnosis Date   Anxiety    Breast cancer ([place]) 2015   Right Breast Cancer   Cancer of right breast ([place]) 09/03/13   Invasive Ductal Carcinoma/Ductal Carcinoma Ins[facility]  biopsies   Chronic low back pain 01/24/2016   -  and R hip pain -seen by rheumatologist and ortho in the past -MRI with Dr. Lorin Mercy in 2015, R hip plain films in 2015 as well acupuncture, injections, tramadol not helpful trigger therapy [facility] and PT helpful    Degenerative joint  disease    back neck and shoulder   Fibromyalgia    Dr. Estanislado Pandy   GERD (gastroesophageal reflux disease)    hx esophagitis   Hot flashes    Hypertension    Leukopenia 06/16/2013   Lupus [facility])    rheumatologist - Dr. Estanislado Pandy   Migraines    Neuromuscular disorder [facility])    neuropathy from chemo in feet/hands   Personal history of chemotherapy    2015   Personal history of radiation therapy 2015   Right Breast Cancer   S/P radiation therapy 04/27/2014-06/07/2014   1) Right breast, 50 Gy in 25 fractions/ 2) Right breast boost, 10 Gy in 5 fractions    Family History  Problem Relation Age of Onset   Hypertension Mother    Dementia Mother        small vessel disease   Cancer Mother 72       cervical  cancer   Cancer Father 39       prostate ca   Diabetes Father    Arthritis Father    Breast cancer Neg Hx    Past Surgical History:  Procedure Laterality Date   BREAST BIOPSY Left 2015   benign core   BREAST BIOPSY Right 2015   malignant core    BREAST LUMPECTOMY Right 2015   BREAST SURGERY  03/01/2014   hx right breast cancer- 3 lymph nodes removed and lumpectomy   CARPAL TUNNEL RELEASE  1998   rt   CESAREAN SECTION     x 3   PORTACATH PLACEMENT  08/2013   still currently active    ROBOTIC ASSISTED BILATERAL SALPINGO OOPHERECTOMY Bilateral 07/09/2014   Procedure: ROBOTIC ASSISTED BILATERAL SALPINGO OOPHORECTOM; :LYSIS OF ADHESIONS;  Surgeon: Marvene Staff, MD;  Location: [facility];  Service: Gynecology;  Laterality: Bilateral;   TRIGGER FINGER RELEASE  2004   rt thumb   trigger finger release left thumb  06/28/14   at orthopedic surgical center   [place] EXTRACTION     Social History   Social History Narrative   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      Home Situation: lives with husband and 42 yo son      Spiritual Beliefs: Christian      Lifestyle: elliptical 3-4 times per week; working on diet as well over  last year in 2014            Immunization History  Administered Date(s) Administered   Influenza Split 06/16/2012   Influenza,inj,Quad PF,6+ Mos 06/17/2013, 04/23/2014, 04/25/2015, 05/17/2017   Pneumococcal Polysaccharide-23 02/16/2015     Objective: Vital Signs: BP (!) 150/86 (BP Location: Left Arm, Patient Position: Sitting, Cuff Size: Small)    Pulse 92    Resp 12    Ht 5\' 4"  (1.626 m)    Wt 181 lb 6.4 oz (82.3 kg)    LMP 09/28/2013 (Approximate)    BMI 31.14 kg/m    Physical Exam Vitals signs and nursing note reviewed.  Constitutional:      Appearance: She is well-developed.  HENT:     Head: Normocephalic and atraumatic.  Eyes:     Conjunctiva/sclera: Conjunctivae normal.  Neck:  Musculoskeletal: Normal range of motion.  Cardiovascular:     Rate and Rhythm: Normal rate and regular rhythm.     Heart sounds: Normal heart sounds.  Pulmonary:     Effort: Pulmonary effort is normal.     Breath sounds: Normal breath sounds.  Abdominal:     General: Bowel sounds are normal.     Palpations: Abdomen is soft.  Lymphadenopathy:     Cervical: No cervical adenopathy.  Skin:    General: Skin is warm and dry.     Capillary Refill: Capillary refill takes less than 2 seconds.  Neurological:     Mental Status: She is alert and oriented to person, place, and time.  Psychiatric:        Behavior: Behavior normal.      Musculoskeletal Exam: C-spine limited range of motion with lateral rotation.  Thoracic and lumbar spine good range of motion.  No midline spinal tenderness.  No SI joint tenderness.  Shoulder joints and elbow joints have good range of motion with no tenderness or inflammation.  She has extensor tenosynovitis of the right wrist.  Hip joints have slightly limited range of motion.  Knee joints have good range of motion with no warmth or effusion.  Ankle joints, MTPs and PIPs and DIPs good range of motion no synovitis.  No warmth or effusion of bilateral ankle joints.    CDAI Exam: CDAI Score: -- Patient Global: --; Provider Global: -- Swollen: --; Tender: -- Joint Exam   No joint exam has been documented for this visit   There is currently no information documented on the homunculus. Go to the Rheumatology activity and complete the homunculus joint exam.  Investigation: No additional findings.  Imaging: No results found.  Recent Labs: Lab Results  Component Value Date   WBC 3.2 (L) 09/25/2018   HGB 12.2 09/25/2018   PLT 309 09/25/2018   NA 138 09/25/2018   K 3.7 09/25/2018   CL 104 09/25/2018   CO2 24 09/25/2018   GLUCOSE 141 (H) 09/25/2018   BUN 12 09/25/2018   CREATININE 0.97 09/25/2018   BILITOT 0.4 09/25/2018   ALKPHOS 54 10/11/2016   AST 25 09/25/2018   ALT 17 09/25/2018   PROT 7.5 09/25/2018   ALBUMIN 4.3 10/11/2016   CALCIUM 9.5 09/25/2018   GFRAA 76 09/25/2018    Speciality Comments: No specialty comments available.  Procedures:  No procedures performed Allergies: Adhesive [tape], Other, and Nickel   Assessment / Plan:     Visit Diagnoses: Autoimmune disease ([place]) - + ANA 1:320 Speck[place]-patient had past Hx of neutropenia, fatigue, oral ulcers, sicca symptoms, parotitis: She presents today with extensor tenosynovitis of the right wrist that started 1 month ago.  She has also been experiencing worsening fatigue over the past several months.  She has chronic sicca symptoms and uses over-the-counter products.  She has not had any oral or nasal ulcerations recently.  She has not had any photosensitivity or rashes.  She has not had any fevers or swollen lymph nodes recently.  She is not had any shortness of breath or palpitations.  We will restart her on Plaquenil 200 mg 1 tablet in the morning and half tablet in the evening.  She previously was on Plaquenil but was asymptomatic for prolonged period of time.  She has tolerated Plaquenil in the past.  We will obtain a CBC and CMP today.  We will also check her autoimmune lab work.   She will follow-up in the office in  5 months.- Plan: CBC with Differential/Platelet, COMPLETE METABOLIC PANEL WITH GFR, Urinalysis, Routine w reflex microscopic, Anti-DNA antibody, double-stranded, C3 and C4, Sedimentation rate, Cyclic citrul peptide antibody, IgG  High risk medication use -She will be restarting on Plaquenil 200 mg 1 tablet in the morning and half tablet in the evening daily.  CBC and CMP will be drawn today.  She will require lab work in  5 months.  She will require an updated PLQ eye exam.  Plan: CBC with Differential/Platelet, COMPLETE METABOLIC PANEL WITH GFR  Trapezius muscle spasm - She has trapezius muscle tension and muscle tenderness bilaterally.  Fibromyalgia - Her pain related to fibromyalgia has been manageable recently.  She takes gabapentin and Effexor as prescribed.  She continues to have chronic fatigue but has been sleeping well at night recently.  We discussed importance of regular exercise and good sleep hygiene  Cyclic citrullinated peptide (CCP) antibody positive - She presents today with extensor tenosynovitis of the right wrist.  CCP was 55 on 09/25/2018.  We will check CCP level today.  Plan: Cyclic citrul peptide antibody, IgG  Primary osteoarthritis of both hands - She presents today with right wrist extensor tenosynovitis.  She has PIP and DIP synovial thickening assist with osteoarthritis of bilateral hands.  She has no tenderness or inflammation of her PIP or DIP joints.  She has complete fist formation bilaterally.  Joint protection and muscle strengthening were discussed.  Primary osteoarthritis of both hips - She has limited range of motion of bilateral hip joints.  Primary osteoarthritis of both feet - She has no feet pain at this time.   DDD (degenerative disc disease), cervical - She has chronic neck pain and stiffness.  She has limited range of motion with lateral rotation.  She has good flexion-extension of the C-spine.  She has no symptoms of  radiculopathy at this time.  Other medical conditions are listed as follows:   Osteopenia, unspecified location   Malignant neoplasm of lower-outer quadrant of right breast of female, estrogen receptor positive ([place])  Essential hypertension  Peripheral neuropathy due to chemotherapy ([place])    Orders: Orders Placed This Encounter  Procedures   CBC with Differential/Platelet   COMPLETE METABOLIC PANEL WITH GFR   Urinalysis, Routine w reflex microscopic   Anti-DNA antibody, double-stranded   C3 and C4   Sedimentation rate   Cyclic citrul peptide antibody, IgG   Meds ordered this encounter  Medications   predniSONE (DELTASONE) 5 MG tablet    Sig: Take 4 tablets by mouth daily x2 days, 3 tablets by mouth daily x2 days, 2 tablets by mouth daily x2 days, 1 tablet by mouth daily x2 days    Dispense:  20 tablet    Refill:  0   hydroxychloroquine (PLAQUENIL) 200 MG tablet    Sig: Take 1 tablet (200 mg) by mouth in the morning and 1/2 tablet by mouth in the evening daily.    Dispense:  135 tablet    Refill:  0    Face-to-face time spent with patient was 30 minutes. Greater than 50% of time was spent in counseling and coordination of care.  Follow-Up Instructions: Return in about 5 months (around 08/18/2019) for Autoimmune Disease, Fibromyalgia, Osteoarthritis.   Ofilia Neas, PA-C   I examined and evaluated the patient with Hazel Sams PA.  Patient is having a flareup with right extensor tenosynovitis.  Will obtain autoimmune labs today.  Side effects of Plaquenil were reviewed and she was restarted on  Plaquenil.  A prednisone taper was also given per her request.  Side effects are reviewed.  The plan of care was discussed as noted above.  [name], MD  Note - This record has been created using Editor, commissioning.  Chart creation errors have been sought, but may not always  have been located. Such creation errors do not reflect on  the standard of medical care.

## 2019-03-18 ENCOUNTER — Encounter: Payer: Self-pay | Admitting: Rheumatology

## 2019-03-18 ENCOUNTER — Other Ambulatory Visit: Payer: Self-pay

## 2019-03-18 ENCOUNTER — Ambulatory Visit (INDEPENDENT_AMBULATORY_CARE_PROVIDER_SITE_OTHER): Payer: BC Managed Care – PPO | Admitting: Rheumatology

## 2019-03-18 VITALS — BP 150/86 | HR 92 | Resp 12 | Ht 64.0 in | Wt 181.4 lb

## 2019-03-18 DIAGNOSIS — M160 Bilateral primary osteoarthritis of hip: Secondary | ICD-10-CM

## 2019-03-18 DIAGNOSIS — Z170 Estrogen receptor positive status [ER+]: Secondary | ICD-10-CM

## 2019-03-18 DIAGNOSIS — C50511 Malignant neoplasm of lower-outer quadrant of right female breast: Secondary | ICD-10-CM

## 2019-03-18 DIAGNOSIS — M62838 Other muscle spasm: Secondary | ICD-10-CM

## 2019-03-18 DIAGNOSIS — I10 Essential (primary) hypertension: Secondary | ICD-10-CM

## 2019-03-18 DIAGNOSIS — R7989 Other specified abnormal findings of blood chemistry: Secondary | ICD-10-CM

## 2019-03-18 DIAGNOSIS — Z79899 Other long term (current) drug therapy: Secondary | ICD-10-CM | POA: Diagnosis not present

## 2019-03-18 DIAGNOSIS — M19042 Primary osteoarthritis, left hand: Secondary | ICD-10-CM

## 2019-03-18 DIAGNOSIS — M19041 Primary osteoarthritis, right hand: Secondary | ICD-10-CM

## 2019-03-18 DIAGNOSIS — M359 Systemic involvement of connective tissue, unspecified: Secondary | ICD-10-CM | POA: Diagnosis not present

## 2019-03-18 DIAGNOSIS — M8580 Other specified disorders of bone density and structure, unspecified site: Secondary | ICD-10-CM | POA: Diagnosis not present

## 2019-03-18 DIAGNOSIS — M19071 Primary osteoarthritis, right ankle and foot: Secondary | ICD-10-CM | POA: Diagnosis not present

## 2019-03-18 DIAGNOSIS — M797 Fibromyalgia: Secondary | ICD-10-CM | POA: Diagnosis not present

## 2019-03-18 DIAGNOSIS — G620 Drug-induced polyneuropathy: Secondary | ICD-10-CM

## 2019-03-18 DIAGNOSIS — M19072 Primary osteoarthritis, left ankle and foot: Secondary | ICD-10-CM

## 2019-03-18 DIAGNOSIS — M5030 Other cervical disc degeneration, unspecified cervical region: Secondary | ICD-10-CM | POA: Diagnosis not present

## 2019-03-18 DIAGNOSIS — T451X5A Adverse effect of antineoplastic and immunosuppressive drugs, initial encounter: Secondary | ICD-10-CM

## 2019-03-18 DIAGNOSIS — R7681 Abnormal rheumatoid factor and anti-citrullinated protein antibody without rheumatoid arthritis: Secondary | ICD-10-CM

## 2019-03-18 DIAGNOSIS — R768 Other specified abnormal immunological findings in serum: Secondary | ICD-10-CM

## 2019-03-18 MED ORDER — PREDNISONE 5 MG PO TABS: ORAL_TABLET | ORAL | 0 refills | Status: DC

## 2019-03-18 MED ORDER — HYDROXYCHLOROQUINE SULFATE 200 MG PO TABS: ORAL_TABLET | ORAL | 0 refills | Status: DC

## 2019-03-19 LAB — CBC WITH DIFFERENTIAL/PLATELET
Absolute Monocytes: 252 cells/uL (ref 200–950)
Basophils Absolute: 30 cells/uL (ref 0–200)
Basophils Relative: 0.8 %
Eosinophils Absolute: 30 cells/uL (ref 15–500)
Eosinophils Relative: 0.8 %
HCT: 38.4 % (ref 35.0–45.0)
Hemoglobin: 12.9 g/dL (ref 11.7–15.5)
Lymphs Abs: 2002 cells/uL (ref 850–3900)
MCH: 29.4 pg (ref 27.0–33.0)
MCHC: 33.6 g/dL (ref 32.0–36.0)
MCV: 87.5 fL (ref 80.0–100.0)
MPV: 10.7 fL (ref 7.5–12.5)
Monocytes Relative: 6.8 %
Neutro Abs: 1388 cells/uL — ABNORMAL LOW (ref 1500–7800)
Neutrophils Relative %: 37.5 %
Platelets: 252 10*3/uL (ref 140–400)
RBC: 4.39 10*6/uL (ref 3.80–5.10)
RDW: 14.5 % (ref 11.0–15.0)
Total Lymphocyte: 54.1 %
WBC: 3.7 10*3/uL — ABNORMAL LOW (ref 3.8–10.8)

## 2019-03-19 LAB — SEDIMENTATION RATE: Sed Rate: 22 mm/h (ref 0–30)

## 2019-03-19 LAB — COMPLETE METABOLIC PANEL WITH GFR
AG Ratio: 1.3 (calc) (ref 1.0–2.5)
ALT: 20 U/L (ref 6–29)
AST: 26 U/L (ref 10–35)
Albumin: 4.3 g/dL (ref 3.6–5.1)
Alkaline phosphatase (APISO): 66 U/L (ref 37–153)
BUN: 12 mg/dL (ref 7–25)
CO2: 25 mmol/L (ref 20–32)
Calcium: 9.9 mg/dL (ref 8.6–10.4)
Chloride: 103 mmol/L (ref 98–110)
Creat: 0.95 mg/dL (ref 0.50–1.05)
GFR, Est African American: 78 mL/min/{1.73_m2} (ref >=60)
GFR, Est Non African American: 67 mL/min/{1.73_m2} (ref >=60)
Globulin: 3.4 g/dL (calc) (ref 1.9–3.7)
Glucose, Bld: 150 mg/dL — ABNORMAL HIGH (ref 65–99)
Potassium: 3.6 mmol/L (ref 3.5–5.3)
Sodium: 138 mmol/L (ref 135–146)
Total Bilirubin: 0.6 mg/dL (ref 0.2–1.2)
Total Protein: 7.7 g/dL (ref 6.1–8.1)

## 2019-03-19 LAB — URINALYSIS, ROUTINE W REFLEX MICROSCOPIC
Bilirubin Urine: NEGATIVE
Glucose, UA: NEGATIVE
Hgb urine dipstick: NEGATIVE
Ketones, ur: NEGATIVE
Leukocytes,Ua: NEGATIVE
Nitrite: NEGATIVE
Protein, ur: NEGATIVE
Specific Gravity, Urine: 1.018 (ref 1.001–1.03)
pH: 7 (ref 5.0–8.0)

## 2019-03-19 LAB — ANTI-DNA ANTIBODY, DOUBLE-STRANDED: ds DNA Ab: 6 IU/mL — ABNORMAL HIGH

## 2019-03-19 LAB — CYCLIC CITRUL PEPTIDE ANTIBODY, IGG: Cyclic Citrullin Peptide Ab: 58 UNITS — ABNORMAL HIGH

## 2019-03-19 LAB — C3 AND C4
C3 Complement: 158 mg/dL (ref 83–193)
C4 Complement: 24 mg/dL (ref 15–57)

## 2019-03-19 NOTE — Progress Notes (Signed)
WBC count is borderline low.  We will continue to monitor.  Glucose is elevated-150. Rest of CMP WNL.  Sed rate WNL.  UA WNL.  Complements WNL.

## 2019-03-19 NOTE — Progress Notes (Signed)
DsDNA is 6-stable.  CCP is positive-58.

## 2019-03-20 ENCOUNTER — Telehealth: Payer: Self-pay | Admitting: Rheumatology

## 2019-03-20 ENCOUNTER — Telehealth: Payer: Self-pay | Admitting: Hematology and Oncology

## 2019-03-20 NOTE — Telephone Encounter (Signed)
Attempted to contact the patient and left message for patient to call the office.  

## 2019-03-20 NOTE — Telephone Encounter (Signed)
Patient called stating she was returning  Andrea's call regarding her labwork results. 

## 2019-03-20 NOTE — Telephone Encounter (Signed)
[place] 9/30 moved f/u from AM to PM. Left message for patient. Schedule mailed.

## 2019-03-20 NOTE — Telephone Encounter (Signed)
Advised patient of lab results from 03/18/2019. Patient verbalized understanding.

## 2019-03-25 ENCOUNTER — Other Ambulatory Visit: Payer: Self-pay | Admitting: Hematology and Oncology

## 2019-03-25 DIAGNOSIS — C50511 Malignant neoplasm of lower-outer quadrant of right female breast: Secondary | ICD-10-CM

## 2019-04-09 ENCOUNTER — Other Ambulatory Visit: Payer: Self-pay | Admitting: Family Medicine

## 2019-04-24 ENCOUNTER — Telehealth: Payer: Self-pay | Admitting: Rheumatology

## 2019-04-24 NOTE — Telephone Encounter (Signed)
Patient called stating she has completed her prescription of Prednisone and still has pain in her hands and wrists.  Patient states she has also been taking her Plaquenil as prescribed.  Patient is requesting another prescription of Prednisone "that is a little stronger than the last dose."

## 2019-04-24 NOTE — Telephone Encounter (Signed)
Patient has been schedule for evaluation on 04/27/19.

## 2019-04-26 ENCOUNTER — Other Ambulatory Visit: Payer: Self-pay | Admitting: Hematology and Oncology

## 2019-04-26 DIAGNOSIS — C50511 Malignant neoplasm of lower-outer quadrant of right female breast: Secondary | ICD-10-CM

## 2019-04-27 ENCOUNTER — Other Ambulatory Visit: Payer: Self-pay

## 2019-04-27 ENCOUNTER — Ambulatory Visit (INDEPENDENT_AMBULATORY_CARE_PROVIDER_SITE_OTHER): Payer: BC Managed Care – PPO | Admitting: Physician Assistant

## 2019-04-27 ENCOUNTER — Encounter: Payer: Self-pay | Admitting: Physician Assistant

## 2019-04-27 VITALS — BP 146/96 | HR 78 | Resp 12 | Ht 64.0 in | Wt 181.8 lb

## 2019-04-27 DIAGNOSIS — M8580 Other specified disorders of bone density and structure, unspecified site: Secondary | ICD-10-CM | POA: Diagnosis not present

## 2019-04-27 DIAGNOSIS — M160 Bilateral primary osteoarthritis of hip: Secondary | ICD-10-CM

## 2019-04-27 DIAGNOSIS — G8929 Other chronic pain: Secondary | ICD-10-CM

## 2019-04-27 DIAGNOSIS — M62838 Other muscle spasm: Secondary | ICD-10-CM

## 2019-04-27 DIAGNOSIS — M5030 Other cervical disc degeneration, unspecified cervical region: Secondary | ICD-10-CM | POA: Diagnosis not present

## 2019-04-27 DIAGNOSIS — M797 Fibromyalgia: Secondary | ICD-10-CM | POA: Diagnosis not present

## 2019-04-27 DIAGNOSIS — C50511 Malignant neoplasm of lower-outer quadrant of right female breast: Secondary | ICD-10-CM

## 2019-04-27 DIAGNOSIS — M19072 Primary osteoarthritis, left ankle and foot: Secondary | ICD-10-CM

## 2019-04-27 DIAGNOSIS — M19041 Primary osteoarthritis, right hand: Secondary | ICD-10-CM | POA: Diagnosis not present

## 2019-04-27 DIAGNOSIS — M359 Systemic involvement of connective tissue, unspecified: Secondary | ICD-10-CM | POA: Diagnosis not present

## 2019-04-27 DIAGNOSIS — T451X5A Adverse effect of antineoplastic and immunosuppressive drugs, initial encounter: Secondary | ICD-10-CM

## 2019-04-27 DIAGNOSIS — Z79899 Other long term (current) drug therapy: Secondary | ICD-10-CM | POA: Diagnosis not present

## 2019-04-27 DIAGNOSIS — M19042 Primary osteoarthritis, left hand: Secondary | ICD-10-CM

## 2019-04-27 DIAGNOSIS — R7989 Other specified abnormal findings of blood chemistry: Secondary | ICD-10-CM | POA: Diagnosis not present

## 2019-04-27 DIAGNOSIS — M19071 Primary osteoarthritis, right ankle and foot: Secondary | ICD-10-CM

## 2019-04-27 DIAGNOSIS — I10 Essential (primary) hypertension: Secondary | ICD-10-CM | POA: Diagnosis not present

## 2019-04-27 DIAGNOSIS — R768 Other specified abnormal immunological findings in serum: Secondary | ICD-10-CM

## 2019-04-27 DIAGNOSIS — R7681 Abnormal rheumatoid factor and anti-citrullinated protein antibody without rheumatoid arthritis: Secondary | ICD-10-CM

## 2019-04-27 DIAGNOSIS — M533 Sacrococcygeal disorders, not elsewhere classified: Secondary | ICD-10-CM

## 2019-04-27 DIAGNOSIS — Z170 Estrogen receptor positive status [ER+]: Secondary | ICD-10-CM

## 2019-04-27 DIAGNOSIS — G620 Drug-induced polyneuropathy: Secondary | ICD-10-CM

## 2019-04-27 NOTE — Progress Notes (Signed)
Office Visit Note  Patient: Tina [name]             Date of Birth: 1961/03/04           MRN: [id number]             PCP: [name], DO Referring: [name], DO Visit Date: 04/27/2019 Occupation: @GUAROCC @  Subjective:  Right wrist pain   History of Present Illness: Avriella Rascon is a 58 y.o. female with history of autoimmune disease, osteoarthritis, DDD, and fibromyalgia.  She is taking Plaquenil 200 mg 1 tablet in the morning and 1/2 tablet in the evening.  She restarted on Plaquenil about 1 month ago.  She has been tolerating Plaquenil without any side effects recently.  She took a prednisone taper which provided temporary relief but she continues to have left wrist pain and swelling.  She has limited range of motion the right wrist joint with discomfort.  She is also been having discomfort in her right third and fourth MCP joints.  She states that several weeks ago she was experiencing increased lower back pain.  She states that she was having muscle spasms which have since resolved.  She is been performing stretching exercises several times per day.  She currently rates her pain a 7 out of 10.  She reports that she occasionally has discomfort in both ankle joints but denies any joint swelling.  She is having generalized muscle aches and muscle tenderness due to fibromyalgia.  She states that her fibromyalgia has been flaring recently.  She has been taking Tylenol, tart cherry, and turmeric.  Activities of Daily Living:  Patient reports joint stiffness all day  Patient Reports nocturnal pain.  Difficulty dressing/grooming: Denies Difficulty climbing stairs: Reports Difficulty getting out of chair: Reports Difficulty using hands for taps, buttons, cutlery, and/or writing: Reports  Review of Systems  Constitutional: Positive for fatigue.  HENT: Positive for nose dryness. Negative for mouth sores and mouth dryness.   Eyes: Positive for dryness. Negative for pain and visual  disturbance.  Respiratory: Negative for cough, hemoptysis, shortness of breath and difficulty breathing.   Cardiovascular: Negative for chest pain, palpitations, hypertension and swelling in legs/feet.  Gastrointestinal: Positive for constipation. Negative for abdominal pain, blood in stool and diarrhea.  Endocrine: Negative for increased urination.  Genitourinary: Negative for painful urination.  Musculoskeletal: Positive for arthralgias, joint pain, joint swelling, morning stiffness and muscle tenderness. Negative for myalgias, muscle weakness and myalgias.  Skin: Negative for color change, pallor, rash, hair loss, nodules/bumps, skin tightness, ulcers and sensitivity to sunlight.  Allergic/Immunologic: Negative for susceptible to infections.  Neurological: Positive for headaches. Negative for dizziness.  Hematological: Negative for swollen glands.  Psychiatric/Behavioral: Positive for sleep disturbance. Negative for depressed mood. The patient is not nervous/anxious.     PMFS History:  Patient Active Problem List   Diagnosis Date Noted   Fibromyalgia 02/28/2016   Chronic low back pain 01/24/2016   Lupus ([place]) 04/19/2015   GERD (gastroesophageal reflux disease) 04/19/2015   Cervicogenic headache 02/10/2015   Neck pain 02/10/2015   Hot flashes 10/26/2014   GAD (generalized anxiety disorder) 10/26/2014   Peripheral neuropathy due to chemotherapy ([place]) 03/12/2014   Breast cancer of lower-outer quadrant of right female breast ([place]) 09/15/2013   Left shoulder pain - seeing Dr. Lorin Mercy 08/20/2013   Hypertension     Past Medical History:  Diagnosis Date   Anxiety    Breast cancer ([place]) 2015   Right Breast Cancer  Cancer of right breast ([place]) 09/03/13   Invasive Ductal Carcinoma/Ductal Carcinoma Ins[facility]  biopsies   Chronic low back pain 01/24/2016   -and R hip pain -seen by rheumatologist and ortho in the past -MRI with Dr. Lorin Mercy in 2015, R hip plain films in 2015 as well  acupuncture, injections, tramadol not helpful trigger therapy [facility] and PT helpful    Degenerative joint disease    back neck and shoulder   Fibromyalgia    Dr. Estanislado Pandy   GERD (gastroesophageal reflux disease)    hx esophagitis   Hot flashes    Hypertension    Leukopenia 06/16/2013   Lupus [facility])    rheumatologist - Dr. Estanislado Pandy   Migraines    Neuromuscular disorder [facility])    neuropathy from chemo in feet/hands   Personal history of chemotherapy    2015   Personal history of radiation therapy 2015   Right Breast Cancer   S/P radiation therapy 04/27/2014-06/07/2014   1) Right breast, 50 Gy in 25 fractions/ 2) Right breast boost, 10 Gy in 5 fractions    Family History  Problem Relation Age of Onset   Hypertension Mother    Dementia Mother        small vessel disease   Cancer Mother 72       cervical  cancer   Cancer Father 55       prostate ca   Diabetes Father    Arthritis Father    Breast cancer Neg Hx    Past Surgical History:  Procedure Laterality Date   BREAST BIOPSY Left 2015   benign core   BREAST BIOPSY Right 2015   malignant core    BREAST LUMPECTOMY Right 2015   BREAST SURGERY  03/01/2014   hx right breast cancer- 3 lymph nodes removed and lumpectomy   CARPAL TUNNEL RELEASE  1998   rt   CESAREAN SECTION     x 3   PORTACATH PLACEMENT  08/2013   still currently active    ROBOTIC ASSISTED BILATERAL SALPINGO OOPHERECTOMY Bilateral 07/09/2014   Procedure: ROBOTIC ASSISTED BILATERAL SALPINGO OOPHORECTOM; :LYSIS OF ADHESIONS;  Surgeon: Marvene Staff, MD;  Location: [facility];  Service: Gynecology;  Laterality: Bilateral;   TRIGGER FINGER RELEASE  2004   rt thumb   trigger finger release left thumb  06/28/14   at orthopedic surgical center   [place] EXTRACTION     Social History   Social History Narrative   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      Home Situation: lives with husband and 53  yo son      Spiritual Beliefs: Christian      Lifestyle: elliptical 3-4 times per week; working on diet as well over last year in 2014            Immunization History  Administered Date(s) Administered   Influenza Split 06/16/2012   Influenza,inj,Quad PF,6+ Mos 06/17/2013, 04/23/2014, 04/25/2015, 05/17/2017   Pneumococcal Polysaccharide-23 02/16/2015     Objective: Vital Signs: BP (!) 146/96 (BP Location: Left Arm, Patient Position: Sitting, Cuff Size: Normal)    Pulse 78    Resp 12    Ht 5\' 4"  (1.626 m)    Wt 181 lb 12.8 oz (82.5 kg)    LMP 09/28/2013 (Approximate)    BMI 31.21 kg/m    Physical Exam Vitals signs and nursing note reviewed.  Constitutional:      Appearance: She is well-developed.  HENT:     Head: Normocephalic and atraumatic.  Eyes:     Conjunctiva/sclera: Conjunctivae normal.  Neck:     Musculoskeletal: Normal range of motion.  Cardiovascular:     Rate and Rhythm: Normal rate and regular rhythm.     Heart sounds: Normal heart sounds.  Pulmonary:     Effort: Pulmonary effort is normal.     Breath sounds: Normal breath sounds.  Abdominal:     General: Bowel sounds are normal.     Palpations: Abdomen is soft.  Lymphadenopathy:     Cervical: No cervical adenopathy.  Skin:    General: Skin is warm and dry.     Capillary Refill: Capillary refill takes less than 2 seconds.  Neurological:     Mental Status: She is alert and oriented to person, place, and time.  Psychiatric:        Behavior: Behavior normal.      Musculoskeletal Exam: C-spine, thoracic spine, and lumbar spine good ROM. Trapezius muscle spasms bilaterally. No midline spinal tenderness. Right SI joint tenderness.  Shoulder joints and elbow joints good ROM with no discomfort. Limited ROM of right wrist joint.  Extensor tenosynovitis of right wrist.  MCPs, PIPs, DIPs good range of motion with no synovitis.  She has tenderness of the right third and fourth MCP joints.  Hip joints have good  range of motion with some discomfort on the right side.  Knee joints, ankle joints, MTPs, PIPs and DIPs good range of motion no synovitis.  No warmth or effusion of bilateral knee joints.  No tenderness or swelling of ankle joints.  CDAI Exam: CDAI Score: 5.2  Patient Global: 7 mm; Provider Global: 5 mm Swollen: 1 ; Tender: 3  Joint Exam      Right  Left  Wrist  Swollen Tender     MCP 3   Tender     MCP 4   Tender        Investigation: No additional findings.  Imaging: No results found.  Recent Labs: Lab Results  Component Value Date   WBC 3.7 (L) 03/18/2019   HGB 12.9 03/18/2019   PLT 252 03/18/2019   NA 138 03/18/2019   K 3.6 03/18/2019   CL 103 03/18/2019   CO2 25 03/18/2019   GLUCOSE 150 (H) 03/18/2019   BUN 12 03/18/2019   CREATININE 0.95 03/18/2019   BILITOT 0.6 03/18/2019   ALKPHOS 54 10/11/2016   AST 26 03/18/2019   ALT 20 03/18/2019   PROT 7.7 03/18/2019   ALBUMIN 4.3 10/11/2016   CALCIUM 9.9 03/18/2019   GFRAA 78 03/18/2019    Speciality Comments: No specialty comments available.  Procedures:  Trigger Point Inj  Date/Time: 04/27/2019 11:10 AM Performed by: Ofilia Neas, PA-C Authorized by: Ofilia Neas, PA-C   Consent Given by:  Patient Site marked: the procedure site was marked   Timeout: prior to procedure the correct patient, procedure, and site was verified   Indications:  Pain Total # of Trigger Points:  2 Location: neck   Needle Size:  27 G Approach:  Dorsal Medications #1:  0.5 mL lidocaine 1 %; 10 mg triamcinolone acetonide 40 MG/ML Medications #2:  0.5 mL lidocaine 1 %; 10 mg triamcinolone acetonide 40 MG/ML Patient tolerance:  Patient tolerated the procedure well with no immediate complications   Allergies: Adhesive [tape], Other, and Nickel   Assessment / Plan:     Visit Diagnoses: Autoimmune disease ([place]) - + ANA 1:320 Speck[place]-patient had past  Hx of neutropenia, fatigue, oral ulcers, sicca symptoms, parotitis, positive  anti-CCP: She presents today with ongoing extensor tenosynovitis of the right wrist joint.  She has tenderness of the right third and fourth MCP joints but no synovitis was noted.  She has been experiencing right wrist joint pain for the past 2 months.  She was restarted on Plaquenil 200 mg 1 tablet in the morning and half tablet in the evening and August 2020.  She has not noticed any improvement since starting on Plaquenil yet.  She took a prednisone taper which provided temporary relief but she continues to have ongoing tenderness and inflammation on examination.  We discussed scheduling an ultrasound-guided right wrist injection.  She was advised to get clearance by her oncologist prior to scheduling this injection since she has right upper extremity lymphedema.  She will continue taking Plaquenil as prescribed.  She does not need any refills at this time.  She has no other worsening clinical features of autoimmune disease at this time.  Complements WNL, sed rate WNL, double-stranded DNA was 6 on 03/18/19.  We will continue to monitor lab work on a regular basis.  We will recheck CBC and CMP today to monitor for drug toxicity since she has been taking Plaquenil for 1 month.  She will return to the office in 6 weeks.  High risk medication use - Plaquenil 200 mg 1 tablet in the morning and half tablet in the evening daily-restarted in August 2020.  CBC and CMP will be checked today and then in 3 months and then every 5 months to monitor for drug toxicity.  She was given a Plaquenil eye exam to take with her to the upcoming appointment.- Plan: CBC with Differential/Platelet, COMPLETE METABOLIC PANEL WITH GFR  Cyclic citrullinated peptide (CCP) antibody positive: She has extensor tenosynovitis of the right wrist joint.  We discussed scheduling ultrasound-guided cortisone injection when she is cleared by her oncologist.  Fibromyalgia: She has been having a fibromyalgia flare for the past 2 weeks.  She is having  generalized muscle aches muscle tenderness.  She is also experiencing trapezius muscle spasms bilaterally.  She reports about 2 weeks ago she developed increased lower back pain.  She has right SI joint tenderness on exam today.  She declined x-ray of the pelvis today.  At her follow-up visit we may order a pelvis x-ray and perform a right SI joint cortisone junction if her pain persists.  She was encouraged to continue to perform exercises on a daily basis.  She was encouraged to stay active and exercise on a regular basis.  Trapezius muscle spasm: She is having trapezius muscle tension and muscle tenderness bilaterally.  She requested trigger point injections.  She tolerated the procedure well.  Procedure note was completed above.  She was advised to monitor blood pressure today. According to the patient she has not taken her BP medication today, but she is planning on taking it after this office visit.   Primary osteoarthritis of both hands: She has osteoarthritic changes in both hands.  She has no tenderness or synovitis of PIP or DIP synovial thickening. Joint protection and muscle strengthening were discussed.   Primary osteoarthritis of both hips: She has limited range of motion of the right hip on exam.  Left hip has good range of motion with no discomfort.  Primary osteoarthritis of both feet: She has no feet pain or joint swelling at this time.  Ankle joints have good range of motion with no discomfort.  DDD (degenerative disc disease), cervical: She has good range of motion with some stiffness and discomfort on exam.  She has not had any symptoms of radiculopathy.  She is having trapezius muscle tension and muscle tenderness bilaterally.  She requested trigger point injections today.  Chronic right SI joint pain: She has tenderness of the right SI joint.  She has been having increased pain for the past 2 weeks.  She was expericening muscle spasms which have resolved.  She has been performing  stretching exercises several times per day.  We discussed the importance of changing positions frequently.  If her symptoms persist she will return for x-rays of the pelvis and a cortisone injection.   Other medical conditions are listed as follows:   Osteopenia, unspecified location  Malignant neoplasm of lower-outer quadrant of right breast of female, estrogen receptor positive ([place])  Essential hypertension  Peripheral neuropathy due to chemotherapy ([place])  Orders: Orders Placed This Encounter  Procedures   Trigger Point Inj   CBC with Differential/Platelet   COMPLETE METABOLIC PANEL WITH GFR   No orders of the defined types were placed in this encounter.   Face-to-face time spent with patient was 30 minutes. Greater than 50% of time was spent in counseling and coordination of care.  Follow-Up Instructions: Return in about 6 weeks (around 06/08/2019) for Autoimmune Disease, Fibromyalgia, Osteoarthritis, DDD.   Ofilia Neas, PA-C  Note - This record has been created using Dragon software.  Chart creation errors have been sought, but may not always  have been located. Such creation errors do not reflect on  the standard of medical care.

## 2019-04-28 LAB — COMPLETE METABOLIC PANEL WITH GFR
AG Ratio: 1.3 (calc) (ref 1.0–2.5)
ALT: 17 U/L (ref 6–29)
AST: 27 U/L (ref 10–35)
Albumin: 4.4 g/dL (ref 3.6–5.1)
Alkaline phosphatase (APISO): 68 U/L (ref 37–153)
BUN: 12 mg/dL (ref 7–25)
CO2: 27 mmol/L (ref 20–32)
Calcium: 9.7 mg/dL (ref 8.6–10.4)
Chloride: 104 mmol/L (ref 98–110)
Creat: 1 mg/dL (ref 0.50–1.05)
GFR, Est African American: 73 mL/min/{1.73_m2} (ref >=60)
GFR, Est Non African American: 63 mL/min/{1.73_m2} (ref >=60)
Globulin: 3.4 g/dL (calc) (ref 1.9–3.7)
Glucose, Bld: 90 mg/dL (ref 65–99)
Potassium: 3.9 mmol/L (ref 3.5–5.3)
Sodium: 142 mmol/L (ref 135–146)
Total Bilirubin: 0.6 mg/dL (ref 0.2–1.2)
Total Protein: 7.8 g/dL (ref 6.1–8.1)

## 2019-04-28 LAB — CBC WITH DIFFERENTIAL/PLATELET
Absolute Monocytes: 176 cells/uL — ABNORMAL LOW (ref 200–950)
Basophils Absolute: 11 cells/uL (ref 0–200)
Basophils Relative: 0.4 %
Eosinophils Absolute: 31 cells/uL (ref 15–500)
Eosinophils Relative: 1.1 %
HCT: 36.8 % (ref 35.0–45.0)
Hemoglobin: 12.2 g/dL (ref 11.7–15.5)
Lymphs Abs: 1478 cells/uL (ref 850–3900)
MCH: 29.5 pg (ref 27.0–33.0)
MCHC: 33.2 g/dL (ref 32.0–36.0)
MCV: 89.1 fL (ref 80.0–100.0)
MPV: 10.2 fL (ref 7.5–12.5)
Monocytes Relative: 6.3 %
Neutro Abs: 1103 cells/uL — ABNORMAL LOW (ref 1500–7800)
Neutrophils Relative %: 39.4 %
Platelets: 252 10*3/uL (ref 140–400)
RBC: 4.13 10*6/uL (ref 3.80–5.10)
RDW: 14.6 % (ref 11.0–15.0)
Total Lymphocyte: 52.8 %
WBC: 2.8 10*3/uL — ABNORMAL LOW (ref 3.8–10.8)

## 2019-04-28 NOTE — Progress Notes (Signed)
Patient Care Team: [name], DO as PCP - General (Family Medicine) [name], [name], MD as Consulting Physician (Emergency Medicine) [name], MD as Consulting Physician (Hematology and Oncology) [name], MD (Inactive) as Consulting Physician (Obstetrics and Gynecology) [name], MD as Consulting Physician (Rheumatology) [name], MD as Consulting Physician (Neurology) [name], MD as Consulting Physician (Hematology and Oncology)  DIAGNOSIS:    ICD-10-CM   1. Malignant neoplasm of lower-outer quadrant of right breast of female, estrogen receptor positive ([place])  C50.511    Z17.0     SUMMARY OF ONCOLOGIC HISTORY: Oncology History  Breast cancer of lower-outer quadrant of right female breast ([place])  09/03/2013 Initial Biopsy   Right breast needle core biopsy at 8 o'clock position: IDC with DCIS, grade 3.  ER+ (weak 7%), PR+ (weak 6%), HER2 positive (ratio 2.22, copy #3.55). Ki67 is 70%.    09/14/2013 Breast MRI   lower outer right breast 2.2 x 1.3 x 1.5 cm Irregular mass. In the upper outer left breast there was a 7 x 5 x 8 mm indeterminate enhancing mass. Also another 9 x 7 x 6 mm indeterminant enhancing mass at 9:00 in the left breast was noted   09/15/2013 Clinical Stage   T2Nx; Stage IIA, right breast invasive ductal carcinoma, grade 3, ER positive, HER-2/neu positive    09/16/2013 Procedure   Left breast needle core biopsy at 2 o'clock position: Benign.  Right axillary LN biopsy: Benign    09/29/2013 Echocardiogram   Pre-treatment EF: 65-70%   10/02/2013 - 01/18/2014 Neo-Adjuvant Chemotherapy   Docetaxel, Carboplatin, Trastuzumab, Pertuzumab X 4 cycles; Taxotere stopped for fluid retention replaced with Gemcitabine 800 mg/m2 and completed 2 more cycles of chemo;  (TCH-P x 4 cycles), ([facility]-P x 2 cycles); Zolodex Q 4 weeks   11/16/2013 Procedure   Genetic testing negative.  Genes tested include BRCA1, BRCA2.    03/01/2014 Definitive Surgery   Right lumpectomy with SLNB [name]) revealed high grade invasive carcinosarcoma [facility] w/malignant heterologous differentiation) spanning 0.6cm. Negative margins. 3 right axillary SLN (-). HER2 repeated & negative (ratio 0.71, copy #1.75).   03/01/2014 Pathologic Stage   ypT1b, pN0, pMX ; Stage IA   04/27/2014 - 06/07/2014 Radiation Therapy   Adjuvant radiation therapy completed [name]). Right breast: total dose 50 Gy over 25 fractions.  Right breast boost: total dose 10 Gy over 5 fractions.    07/06/2014 Echocardiogram   Most recent EF: 60-65%. Being followed in [place] clinic.    07/09/2014 Surgery   Bilat salpingo-oopherectomy and lysis of adhesions; surgical path benign. Uterus and cervix remain intact.    07/28/2014 - 11/16/2014 Anti-estrogen oral therapy   Anastrozole 1 mg daily started.  Planned duration of treatment: 5 years.  Anastrazole stopped 10/2014 d/t pain in hands and feet. Switched to Tamoxifen.     - 10/20/2014 Chemotherapy   1 year maintenance Herceptin completed    11/16/2014 - 01/24/2015 Anti-estrogen oral therapy   Tamoxifen 2m daily stopped due to persistent myalgias arthralgias and hot flashes   11/18/2014 Survivorship   Survivorship Care Plan given and reviewed with pt in-person.    01/24/2015 -  Anti-estrogen oral therapy   Exemestane 25 mg once daily, switched to tamoxifen 20 mg daily 10/24/2015     CHIEF COMPLIANT: Follow-up of right breast cancer on tamoxifen therapy  INTERVAL HISTORY: Tina [name] a 58y.o. with above-mentioned history of HER-2 positive right breast cancer treated with neoadjuvant chemotherapy, lumpectomy, radiation, and who is  currently on anti-estrogen therapy with tamoxifen. I last saw her a year ago. Mammogram on 09/10/18 showed no evidence of malignancy bilaterally. She presents to the clinic today for annual follow-up.   REVIEW OF SYSTEMS:   Constitutional: Denies fevers, chills or abnormal weight loss Eyes: Denies blurriness of  vision Ears, nose, mouth, throat, and face: Denies mucositis or sore throat Respiratory: Denies cough, dyspnea or wheezes Cardiovascular: Denies palpitation, chest discomfort Gastrointestinal: Denies nausea, heartburn or change in bowel habits Skin: Denies abnormal skin rashes Lymphatics: Denies new lymphadenopathy or easy bruising Neurological: Denies numbness, tingling or new weaknesses Behavioral/Psych: Mood is stable, no new changes  Extremities: No lower extremity edema Breast: denies any pain or lumps or nodules in either breasts All other systems were reviewed with the patient and are negative.  I have reviewed the past medical history, past surgical history, social history and family history with the patient and they are unchanged from previous note.  ALLERGIES:  is allergic to adhesive [tape]; other; and nickel.  MEDICATIONS:  Current Outpatient Medications  Medication Sig Dispense Refill  . aspirin 81 MG tablet Take 81 mg by mouth daily.    Marland Kitchen b complex vitamins capsule Take 1 capsule by mouth daily.    Marland Kitchen BIOTIN 5000 PO Take 1 capsule by mouth daily.     . Calcium Carbonate-Vit D-Min (CALTRATE 600+D PLUS MINERALS PO) Take 1 tablet by mouth daily.    Marland Kitchen gabapentin (NEURONTIN) 600 MG tablet Take 1 tablet (600 mg total) by mouth 3 (three) times daily. 90 tablet 11  . hydroxychloroquine (PLAQUENIL) 200 MG tablet Take 1 tablet (200 mg) by mouth in the morning and 1/2 tablet by mouth in the evening daily. 135 tablet 0  . lisinopril-hydrochlorothiazide (ZESTORETIC) 20-12.5 MG tablet TAKE 1 TABLET BY MOUTH DAILY ( PATIENT NEEDS AN APPOINTMENT) 90 tablet 0  . loratadine (CLARITIN) 10 MG tablet Take 10 mg by mouth as needed.     Marland Kitchen omeprazole (PRILOSEC) 40 MG capsule Take 1 capsule by mouth once daily 30 capsule 0  . SUMAtriptan (IMITREX) 25 MG tablet Take 1 tablet (25 mg total) by mouth every 2 (two) hours as needed for migraine. May repeat in 2 hours if needed. Do not take more then 2  tablets in 24 hours. 10 tablet 0  . TURMERIC PO Take by mouth daily.    Marland Kitchen venlafaxine XR (EFFEXOR-XR) 150 MG 24 hr capsule Take one tablet daily. 90 capsule 1   No current facility-administered medications for this visit.    Facility-Administered Medications Ordered in Other Visits  Medication Dose Route Frequency Provider Last Rate Last Dose  . prochlorperazine (COMPAZINE) injection 10 mg  10 mg Intravenous Q6H PRN Gardenia Phlegm, NP   10 mg at 12/26/13 0539    PHYSICAL EXAMINATION: ECOG PERFORMANCE STATUS: 1 - Symptomatic but completely ambulatory  Vitals:   04/29/19 1510  BP: (!) 148/110  Pulse: 80  Resp: 18  Temp: 98.3 F (36.8 C)  SpO2: 100%   Filed Weights   04/29/19 1510  Weight: 182 lb 1.6 oz (82.6 kg)    GENERAL: alert, no distress and comfortable SKIN: skin color, texture, turgor are normal, no rashes or significant lesions EYES: normal, Conjunctiva are pink and non-injected, sclera clear OROPHARYNX: no exudate, no erythema and lips, buccal mucosa, and tongue normal  NECK: supple, thyroid normal size, non-tender, without nodularity LYMPH: no palpable lymphadenopathy in the cervical, axillary or inguinal LUNGS: clear to auscultation and percussion with normal breathing effort  HEART: regular rate & rhythm and no murmurs and no lower extremity edema ABDOMEN: abdomen soft, non-tender and normal bowel sounds MUSCULOSKELETAL: no cyanosis of digits and no clubbing  NEURO: alert & oriented x 3 with fluent speech, no focal motor/sensory deficits EXTREMITIES: No lower extremity edema BREAST: No palpable masses or nodules in either right or left breasts. No palpable axillary supraclavicular or infraclavicular adenopathy no breast tenderness or nipple discharge. (exam performed in the presence of a chaperone)  LABORATORY DATA:  I have reviewed the data as listed CMP Latest Ref Rng & Units 04/27/2019 03/18/2019 09/25/2018  Glucose 65 - 99 mg/dL 90 150(H) 141(H)  BUN  7 - 25 mg/dL _0 Creatinine 0.50 - 1.05 mg/dL 1.00 0.95 0.97  Sodium 135 - 146 mmol/L 142 138 138  Potassium 3.5 - 5.3 mmol/L 3.9 3.6 3.7  Chloride 98 - 110 mmol/L 104 103 104  CO2 20 - 32 mmol/L _1 Calcium 8.6 - 10.4 mg/dL 9.7 9.9 9.5  Total Protein 6.1 - 8.1 g/dL 7.8 7.7 7.5  Total Bilirubin 0.2 - 1.2 mg/dL 0.6 0.6 0.4  Alkaline Phos 33 - 130 U/L - - -  AST 10 - 35 U/L _2 ALT 6 - 29 U/L _3 Lab Results  Component Value Date   WBC 2.8 (L) 04/27/2019   HGB 12.2 04/27/2019   HCT 36.8 04/27/2019   MCV 89.1 04/27/2019   PLT 252 04/27/2019   NEUTROABS 1,103 (L) 04/27/2019    ASSESSMENT & PLAN:  Breast cancer of lower-outer quadrant of right female breast Right breast T2N0 IIA invasive ductal carcinoma, grade 3, ER 7% PR 6%, Ki-67 70%, HER-2/neu positive. Status post neoadjuvant [place] for just started 10/02/2013 4 cycles stopped due to neuropathy and fluid retention, gemcitabine carboplatin Herceptin Perjeta 2 with Zoladex to 28 days status post lumpectomy 03/01/2014 residual 0.6 cm tumor grade 3. Completed radiation therapy November 2015  Antiestrogen therapy:  1. Initially given Anastrozole 1 mg daily (Herceptin maintenance completed 10/20/2014) 2. Switch to tamoxifen March 2016 3. Switch to exemestane 01/24/2015 (due to muscle aches, hot flashes) 4. Switched back to tamoxifen 10/24/2015 ( due to less side effect comparatively to AI therapy), discontinue tamoxifen because of personal preference   Breast cancer surveillance: 1. Mammogram 09/10/2018 no evidence of malignancy breast density category B 2. Breast exam  04/29/2019: Benign   Since patient completed 5 years from her diagnosis, she will now follow-up with her primary care physician and her gynecologist for annual checkups. We will see her on an as-needed basis.   No orders of the defined types were placed in this encounter.  The patient has a good understanding of the overall plan. she  agrees with it. she will call with any problems that may develop before the next visit here.  [name], MD 04/29/2019  Julious Oka Dorshimer am acting as scribe for Dr. [name].  I have reviewed the above documentation for accuracy and completeness, and I agree with the above.

## 2019-04-28 NOTE — Progress Notes (Signed)
Please call patient. I noticed she is due for an appointment. There is no appointment scheduled to establish a new in house PCP. As she/he is aware, I am now doing telemedicine for [place]. I am happy to assist in her care when telemedicine is appropriate, but we would need to schedule a visit for her to establish with and meet Dr. Ethlyn Gallery. Happy to see her while waiting on that visit if needed. If she prefers a fulltime in office physician then she could establish with one of our other providers.   Thanks.

## 2019-04-28 NOTE — Progress Notes (Signed)
WBC count is low but trending down.  She has a history of neutropenia. Reviewed with Dr. Estanislado Pandy.  She would like to repeat CBC at her upcoming appointment on 06/08/19.   CMP WNL

## 2019-04-29 ENCOUNTER — Inpatient Hospital Stay: Payer: BC Managed Care – PPO | Attending: Hematology and Oncology | Admitting: Hematology and Oncology

## 2019-04-29 ENCOUNTER — Other Ambulatory Visit: Payer: Self-pay

## 2019-04-29 DIAGNOSIS — Z7982 Long term (current) use of aspirin: Secondary | ICD-10-CM | POA: Insufficient documentation

## 2019-04-29 DIAGNOSIS — Z9221 Personal history of antineoplastic chemotherapy: Secondary | ICD-10-CM | POA: Diagnosis not present

## 2019-04-29 DIAGNOSIS — Z79899 Other long term (current) drug therapy: Secondary | ICD-10-CM | POA: Insufficient documentation

## 2019-04-29 DIAGNOSIS — Z853 Personal history of malignant neoplasm of breast: Secondary | ICD-10-CM | POA: Diagnosis not present

## 2019-04-29 DIAGNOSIS — Z923 Personal history of irradiation: Secondary | ICD-10-CM | POA: Diagnosis not present

## 2019-04-29 DIAGNOSIS — C50511 Malignant neoplasm of lower-outer quadrant of right female breast: Secondary | ICD-10-CM

## 2019-04-29 DIAGNOSIS — Z170 Estrogen receptor positive status [ER+]: Secondary | ICD-10-CM | POA: Diagnosis not present

## 2019-04-29 MED ORDER — AMOXICILLIN 500 MG PO TABS: 1000.0000 mg | ORAL_TABLET | Freq: Once | ORAL | 0 refills | Status: AC

## 2019-04-29 NOTE — Assessment & Plan Note (Signed)
Right breast T2N0 IIA invasive ductal carcinoma, grade 3, ER 7% PR 6%, Ki-67 70%, HER-2/neu positive. Status post neoadjuvant [place] for just started 10/02/2013 4 cycles stopped due to neuropathy and fluid retention, gemcitabine carboplatin Herceptin Perjeta 2 with Zoladex to 28 days status post lumpectomy 03/01/2014 residual 0.6 cm tumor grade 3. Completed radiation therapy November 2015  Antiestrogen therapy:  1. Initially given Anastrozole 1 mg daily (Herceptin maintenance completed 10/20/2014) 2. Switch to tamoxifen March 2016 3. Switch to exemestane 01/24/2015 (due to muscle aches, hot flashes) 4. Switched back to tamoxifen 10/24/2015 ( due to less side effect comparatively to AI therapy)  Tamoxifen toxicities: Hot flashes and joint pains She stopped taking tamoxifen recently.  I encouraged her to take at least half a tablet daily.  She is willing to consider that.  Breast cancer surveillance: 1. Mammogram 09/10/2018 no evidence of malignancy breast density category B 2. Breast exam  04/29/2019: Benign  Return to clinic [id number] follow-up

## 2019-05-05 ENCOUNTER — Other Ambulatory Visit: Payer: Self-pay

## 2019-05-05 ENCOUNTER — Encounter: Payer: Self-pay | Admitting: Family Medicine

## 2019-05-05 ENCOUNTER — Ambulatory Visit (INDEPENDENT_AMBULATORY_CARE_PROVIDER_SITE_OTHER): Payer: BC Managed Care – PPO | Admitting: Family Medicine

## 2019-05-05 VITALS — BP 140/88 | HR 88 | Temp 97.7°F | Resp 12 | Ht 64.0 in | Wt 181.2 lb

## 2019-05-05 DIAGNOSIS — E669 Obesity, unspecified: Secondary | ICD-10-CM | POA: Diagnosis not present

## 2019-05-05 DIAGNOSIS — R232 Flushing: Secondary | ICD-10-CM | POA: Diagnosis not present

## 2019-05-05 DIAGNOSIS — Z23 Encounter for immunization: Secondary | ICD-10-CM | POA: Diagnosis not present

## 2019-05-05 DIAGNOSIS — R739 Hyperglycemia, unspecified: Secondary | ICD-10-CM

## 2019-05-05 DIAGNOSIS — E118 Type 2 diabetes mellitus with unspecified complications: Secondary | ICD-10-CM

## 2019-05-05 DIAGNOSIS — T451X5A Adverse effect of antineoplastic and immunosuppressive drugs, initial encounter: Secondary | ICD-10-CM | POA: Diagnosis not present

## 2019-05-05 DIAGNOSIS — G620 Drug-induced polyneuropathy: Secondary | ICD-10-CM | POA: Diagnosis not present

## 2019-05-05 DIAGNOSIS — E785 Hyperlipidemia, unspecified: Secondary | ICD-10-CM

## 2019-05-05 DIAGNOSIS — I10 Essential (primary) hypertension: Secondary | ICD-10-CM | POA: Diagnosis not present

## 2019-05-05 LAB — LIPID PANEL
Cholesterol: 266 mg/dL — ABNORMAL HIGH (ref 0–200)
HDL: 74.1 mg/dL (ref >39.00)
LDL Cholesterol: 162 mg/dL — ABNORMAL HIGH (ref 0–99)
NonHDL: 192.14
Total CHOL/HDL Ratio: 4
Triglycerides: 151 mg/dL — ABNORMAL HIGH (ref 0.0–149.0)
VLDL: 30.2 mg/dL (ref 0.0–40.0)

## 2019-05-05 LAB — MICROALBUMIN / CREATININE URINE RATIO
Creatinine,U: 138 mg/dL
Microalb Creat Ratio: 0.5 mg/g (ref 0.0–30.0)
Microalb, Ur: 0.7 mg/dL (ref 0.0–1.9)

## 2019-05-05 LAB — HEMOGLOBIN A1C: Hgb A1c MFr Bld: 6.5 % (ref 4.6–6.5)

## 2019-05-05 MED ORDER — VENLAFAXINE HCL ER 150 MG PO CP24: ORAL_CAPSULE | ORAL | 1 refills | Status: DC

## 2019-05-05 NOTE — Assessment & Plan Note (Signed)
Problem is well controlled with gabapentin 600 mg 3 times daily. No changes in current management. She is no longer following with neurologist and recently she had her last visit with Dr. Sonny Dandy, so I would like for me to continue medication. Appropriate foot care.

## 2019-05-05 NOTE — Assessment & Plan Note (Signed)
Continue nonpharmacologic treatment. Further recommendation will be given according to lab results.

## 2019-05-05 NOTE — Patient Instructions (Addendum)
A few things to remember from today's visit:   Hyperlipidemia, unspecified hyperlipidemia type  Hyperglycemia  Hot flashes  Essential hypertension  Peripheral neuropathy due to chemotherapy ([place])  Monitor blood pressure daily with an appropriate technique. Goal blood pressure under or equal 130/80. Please let me know if numbers above 140/90. Low salt diet.  Stop sweet tea.   Please be sure medication list is accurate. If a new problem present, please set up appointment sooner than planned today.

## 2019-05-05 NOTE — Assessment & Plan Note (Signed)
Effexor XR 150 mg has helped with symptoms. No changes in current management. Recommend trying to avoid foods/beverages that aggravate symptoms.

## 2019-05-05 NOTE — Progress Notes (Signed)
HPI:   Tina [name] is a 58 y.o. female, who is here today to establish care.  Former PCP: Dr Maudie Mercury Last preventive routine visit: 02/2017.  Chronic medical problems: HTN,HLD,Lupus,right breast cancer (Dx 2015) s/p chemotherapy, peripheral neuropathy due to chemotherapy agent, anxiety, fibromyalgia, and GERD among home. She follows with rheumatologist.  SLE, currently she is on Plaquenil 200 mg am and 100 mg pm.  Lab Results  Component Value Date   WBC 2.8 (L) 04/27/2019   HGB 12.2 04/27/2019   HCT 36.8 04/27/2019   MCV 89.1 04/27/2019   PLT 252 04/27/2019   Lab Results  Component Value Date   ALT 17 04/27/2019   AST 27 04/27/2019   ALKPHOS 54 10/11/2016   BILITOT 0.6 04/27/2019   Hot flashes: Currently she is on Effexor XR 150 mg daily. She also has history of anxiety, she denies having symptoms. Denies depressed mood.  Concerns today: Cholesterol check. She is not aware of history of DM 2. Reviewing labs she has had elevated glucose and A1c. Last time she took prednisone to treat lupus exacerbation was a month ago, first this year.  Denies abdominal pain, nausea,vomiting, polydipsia,polyuria, or polyphagia.  Lab Results  Component Value Date   HGBA1C 6.5 12/10/2018   Peripheral neuropathy: Currently she is on gabapentin 600 mg 3 times daily, which has helped with symptoms. Mild tingling and numbness of lower extremities. She has not noted focal weakness, edema, or erythema.  Hypertension: Currently she is on lisinopril-HCTZ 20-12.5 mg daily. Dx in 2015. Evaluated by cardiologist and had echo done periodically to monitor LVEF due to chemo.  She denies checking BP at home. Today BP is mildly elevated. During recent visit with her oncologist, BP was 165/99.   Denies severe/frequent headache, visual changes, chest pain, dyspnea, palpitation, claudication, focal weakness, or edema.  Hyperlipidemia: Currently she is not on pharmacologic treatment.  She has not been consistent with following a low fat diet.  She has not been exercising regularly nor following a healthful diet. She drinks sweet tea almost daily.  Review of Systems  Constitutional: Negative for activity change, appetite change, fatigue, fever and unexpected weight change.  HENT: Negative for mouth sores, nosebleeds and sore throat.   Eyes: Negative for pain and redness.  Respiratory: Negative for cough and wheezing.   Gastrointestinal: Negative for blood in stool.       Negative for changes in bowel habits.  Endocrine: Negative for cold intolerance and heat intolerance.  Genitourinary: Negative for decreased urine volume, dysuria and hematuria.  Musculoskeletal: Negative for gait problem and myalgias.  Skin: Negative for rash and wound.  Neurological: Positive for numbness. Negative for syncope and facial asymmetry.  Rest see pertinent positives and negatives per HPI.   Current Outpatient Medications on File Prior to Visit  Medication Sig Dispense Refill  . aspirin 81 MG tablet Take 81 mg by mouth daily.    Marland Kitchen b complex vitamins capsule Take 1 capsule by mouth daily.    Marland Kitchen BIOTIN 5000 PO Take 1 capsule by mouth. Takes sometimes    . Calcium Carbonate-Vit D-Min (CALTRATE 600+D PLUS MINERALS PO) Take 1 tablet by mouth daily.    Marland Kitchen gabapentin (NEURONTIN) 600 MG tablet Take 1 tablet (600 mg total) by mouth 3 (three) times daily. 90 tablet 11  . hydroxychloroquine (PLAQUENIL) 200 MG tablet Take 1 tablet (200 mg) by mouth in the morning and 1/2 tablet by mouth in the evening daily. 135 tablet 0  .  lisinopril-hydrochlorothiazide (ZESTORETIC) 20-12.5 MG tablet TAKE 1 TABLET BY MOUTH DAILY ( PATIENT NEEDS AN APPOINTMENT) 90 tablet 0  . loratadine (CLARITIN) 10 MG tablet Take 10 mg by mouth as needed.     Marland Kitchen omeprazole (PRILOSEC) 40 MG capsule Take 1 capsule by mouth once daily 30 capsule 0  . SUMAtriptan (IMITREX) 25 MG tablet Take 1 tablet (25 mg total) by mouth every 2 (two)  hours as needed for migraine. May repeat in 2 hours if needed. Do not take more then 2 tablets in 24 hours. 10 tablet 0  . TURMERIC PO Take by mouth daily.     Current Facility-Administered Medications on File Prior to Visit  Medication Dose Route Frequency Provider Last Rate Last Dose  . prochlorperazine (COMPAZINE) injection 10 mg  10 mg Intravenous Q6H PRN Gardenia Phlegm, NP   10 mg at 12/26/13 [id number]     Past Medical History:  Diagnosis Date  . Anxiety   . Breast cancer ([place]) 2015   Right Breast Cancer  . Cancer of right breast ([place]) 09/03/13   Invasive Ductal Carcinoma/Ductal Carcinoma Insitu  biopsies  . Chronic low back pain 01/24/2016   -and R hip pain -seen by rheumatologist and ortho in the past -MRI with Dr. Lorin Mercy in 2015, R hip plain films in 2015 as well acupuncture, injections, tramadol not helpful trigger therapy at [facility] and PT helpful   . Degenerative joint disease    back neck and shoulder  . Fibromyalgia    Dr. Estanislado Pandy  . GERD (gastroesophageal reflux disease)    hx esophagitis  . Hot flashes   . Hypertension   . Leukopenia 06/16/2013  . Lupus [facility])    rheumatologist - Dr. Estanislado Pandy  . Migraines   . Neuromuscular disorder ([place])    neuropathy from chemo in feet/hands  . Personal history of chemotherapy    2015  . Personal history of radiation therapy 2015   Right Breast Cancer  . S/P radiation therapy 04/27/2014-06/07/2014   1) Right breast, 50 Gy in 25 fractions/ 2) Right breast boost, 10 Gy in 5 fractions   Allergies  Allergen Reactions  . Adhesive [Tape] Itching    Burned and scarred skinned  . Other     Cholroprep-causes rash  . Nickel Rash    Family History  Problem Relation Age of Onset  . Hypertension Mother   . Dementia Mother        small vessel disease  . Cancer Mother 30       cervical  cancer  . Cancer Father 59       prostate ca  . Diabetes Father   . Arthritis Father   . Breast cancer Neg Hx     Social  History   Socioeconomic History  . Marital status: Married    Spouse name: Not on file  . Number of children: 1  . Years of education: Not on file  . Highest education level: Not on file  Occupational History    Comment: homemaker  Social Needs  . Financial resource strain: Not on file  . Food insecurity    Worry: Not on file    Inability: Not on file  . Transportation needs    Medical: Not on file    Non-medical: Not on file  Tobacco Use  . Smoking status: Former Smoker    Packs/day: 1.00    Years: 15.00    Pack years: 15.00    Types: Cigars, Cigarettes  Quit date: 02/23/2004    Years since quitting: 15.2  . Smokeless tobacco: Never Used  . Tobacco comment: remote smoking history  Substance and Sexual Activity  . Alcohol use: Yes    Alcohol/week: 0.0 standard drinks    Comment: rare  . Drug use: Yes    Comment: Marijuana as needed   . Sexual activity: Yes  Lifestyle  . Physical activity    Days per week: Not on file    Minutes per session: Not on file  . Stress: Not on file  Relationships  . Social Herbalist on phone: Not on file    Gets together: Not on file    Attends religious service: Not on file    Active member of club or organization: Not on file    Attends meetings of clubs or organizations: Not on file    Relationship status: Not on file  Other Topics Concern  . Not on file  Social History Narrative   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      Home Situation: lives with husband and 16 yo son      Spiritual Beliefs: Christian      Lifestyle: elliptical 3-4 times per week; working on diet as well over last year in 2014             Vitals:   05/05/19 1206  BP: 140/88  Pulse: 88  Temp: 97.7 F (36.5 C)  SpO2: 98%    Wt Readings from Last 3 Encounters:  05/05/19 181 lb 4 oz (82.2 kg)  04/29/19 182 lb 1.6 oz (82.6 kg)  04/27/19 181 lb 12.8 oz (82.5 kg)    Body mass index is 31.11 kg/m.    Physical Exam  Nursing note and vitals  reviewed. Constitutional: She is oriented to person, place, and time. She appears well-developed. No distress.  HENT:  Head: Normocephalic and atraumatic.  Mouth/Throat: Oropharynx is clear and moist and mucous membranes are normal.  Eyes: Pupils are equal, round, and reactive to light. Conjunctivae are normal.  Cardiovascular: Normal rate and regular rhythm.  No murmur heard. Pulses:      Dorsalis pedis pulses are 2+ on the right side and 2+ on the left side.  Respiratory: Effort normal and breath sounds normal. No respiratory distress.  GI: Soft. She exhibits no mass. There is no hepatomegaly. There is no abdominal tenderness.  Musculoskeletal:        General: No edema.  Lymphadenopathy:    She has no cervical adenopathy.  Neurological: She is alert and oriented to person, place, and time. She has normal strength. No cranial nerve deficit. Gait normal.  Skin: Skin is warm. No rash noted. No erythema.  Psychiatric: She has a normal mood and affect.  Well groomed, good eye contact.   ASSESSMENT AND PLAN:  Ms. Pearline was seen today for transfer of care.  Diagnoses and all orders for this visit:  Orders Placed This Encounter  Procedures  . Flu Vaccine QUAD 36+ mos IM  . Microalbumin / creatinine urine ratio  . Hemoglobin A1c  . Fructosamine  . Lipid panel   Lab Results  Component Value Date   HGBA1C 6.5 05/05/2019   Lab Results  Component Value Date   MICROALBUR 0.7 05/05/2019   Lab Results  Component Value Date   CHOL 266 (H) 05/05/2019   HDL 74.10 05/05/2019   LDLCALC 162 (H) 05/05/2019   LDLDIRECT 123.0 10/26/2014   TRIG 151.0 (H) 05/05/2019  CHOLHDL 4 05/05/2019     Peripheral neuropathy due to chemotherapy Problem is well controlled with gabapentin 600 mg 3 times daily. No changes in current management. She is no longer following with neurologist and recently she had her last visit with Dr. Sonny Dandy, so I would like for me to continue medication. Appropriate  foot care.  Hypertension BP mildly elevated. For now she does not want to have changes in current management. We discussed possible complications of elevated BP. Low-salt diet. Recommend monitoring BP daily, educated about appropriate technique. Follow-up in 3 to 4 months.  Hot flashes Effexor XR 150 mg has helped with symptoms. No changes in current management. Recommend trying to avoid foods/beverages that aggravate symptoms.  Hyperlipidemia Continue nonpharmacologic treatment. Further recommendation will be given according to lab results.  Obesity (BMI 30-39.9) We discussed benefits of wt loss as well as adverse effects of obesity. Consistency with healthy diet and physical activity recommended. She agrees with stopping sweet tea intake.   Hyperglycemia Further recommendation will be given according to A1c.  Controlled type 2 diabetes mellitus with complication, without long-term current use of insulin ([place]) Educated about diagnosis criteria for DM 2. We will decide about pharmacologic treatment according to A1c. Eye exam is current.  Need for influenza vaccination -     Flu Vaccine QUAD 36+ mos IM   Return in about 4 months (around 09/05/2019) for HTN.   Betty G. Martinique, MD  [facility]. [place] office.

## 2019-05-05 NOTE — Assessment & Plan Note (Signed)
BP mildly elevated. For now she does not want to have changes in current management. We discussed possible complications of elevated BP. Low-salt diet. Recommend monitoring BP daily, educated about appropriate technique. Follow-up in 3 to 4 months.

## 2019-05-05 NOTE — Assessment & Plan Note (Signed)
We discussed benefits of wt loss as well as adverse effects of obesity. Consistency with healthy diet and physical activity recommended. She agrees with stopping sweet tea intake.

## 2019-05-06 ENCOUNTER — Other Ambulatory Visit: Payer: BC Managed Care – PPO | Admitting: Rheumatology

## 2019-05-07 DIAGNOSIS — E118 Type 2 diabetes mellitus with unspecified complications: Secondary | ICD-10-CM | POA: Insufficient documentation

## 2019-05-07 LAB — FRUCTOSAMINE: Fructosamine: 255 umol/L (ref 205–285)

## 2019-05-08 NOTE — Progress Notes (Signed)
Left message to return to return call to office for lab results.

## 2019-05-11 ENCOUNTER — Other Ambulatory Visit: Payer: Self-pay | Admitting: *Deleted

## 2019-05-11 DIAGNOSIS — E118 Type 2 diabetes mellitus with unspecified complications: Secondary | ICD-10-CM

## 2019-05-26 ENCOUNTER — Other Ambulatory Visit: Payer: Self-pay | Admitting: Hematology and Oncology

## 2019-05-26 DIAGNOSIS — C50511 Malignant neoplasm of lower-outer quadrant of right female breast: Secondary | ICD-10-CM

## 2019-05-26 NOTE — Progress Notes (Signed)
Office Visit Note  Patient: Tina [name]             Date of Birth: 1961-05-29           MRN: [id number]             PCP: Martinique, Betty G, MD Referring: [name], DO Visit Date: 06/08/2019 Occupation: @GUAROCC @  Subjective:  Medication monitoring    History of Present Illness: Tina [name] is a 58 y.o. female with history of autoimmune disease, fibromyalgia, and osteoarthritis.  She is taking Plaquenil 200 mg 1 tablet in the morning and 1/2 tablet in the evening.  She has been tolerating PLQ without any side effects since restarting taking it in August 2020.  She denies any signs of an autoimmune flare.  She states she still has some mild inflammation in the right wrist but denies any joint pain. She denies any other joint pain or joint swelling. She continues to have generalized muscle aches and muscle tenderness due to fibromyalgia.  She had trapezius trigger point injections on 04/27/19, which resolved the tension and spasms she was experiencing.  She states her level of fatigue has improved. She denies any fevers or enlarged lymph nodes.  She denies any recent rashes, photosensitivity, Raynaud's, or hair loss.  She denies any sores in mouth or nose.  She continues to have chronic sicca symptoms.     Activities of Daily Living:  Patient reports joint stiffness all day  Patient Reports nocturnal pain.  Difficulty dressing/grooming: Denies Difficulty climbing stairs: Denies Difficulty getting out of chair: Denies Difficulty using hands for taps, buttons, cutlery, and/or writing: Denies  Review of Systems  Constitutional: Positive for fatigue.  HENT: Positive for mouth dryness. Negative for mouth sores and nose dryness.   Eyes: Positive for dryness. Negative for pain and visual disturbance.  Respiratory: Negative for cough, hemoptysis, shortness of breath and difficulty breathing.   Cardiovascular: Negative for chest pain, palpitations, hypertension and swelling in legs/feet.   Gastrointestinal: Negative for blood in stool, constipation and diarrhea.  Endocrine: Negative for increased urination.  Musculoskeletal: Positive for arthralgias, joint pain, joint swelling, morning stiffness and muscle tenderness. Negative for myalgias, muscle weakness and myalgias.  Skin: Negative for color change, pallor, rash, hair loss, nodules/bumps, skin tightness, ulcers and sensitivity to sunlight.  Allergic/Immunologic: Negative for susceptible to infections.  Neurological: Negative for dizziness, headaches and weakness.  Hematological: Negative for bruising/bleeding tendency and swollen glands.  Psychiatric/Behavioral: Negative for depressed mood and sleep disturbance. The patient is not nervous/anxious.     PMFS History:  Patient Active Problem List   Diagnosis Date Noted   Controlled type 2 diabetes mellitus with complication, without long-term current use of insulin ([place]) 05/07/2019   Hyperlipidemia 05/05/2019   Obesity (BMI 30-39.9) 05/05/2019   Fibromyalgia 02/28/2016   Chronic low back pain 01/24/2016   Lupus ([place]) 04/19/2015   GERD (gastroesophageal reflux disease) 04/19/2015   Cervicogenic headache 02/10/2015   Neck pain 02/10/2015   Hot flashes 10/26/2014   GAD (generalized anxiety disorder) 10/26/2014   Peripheral neuropathy due to chemotherapy ([place]) 03/12/2014   Breast cancer of lower-outer quadrant of right female breast ([place]) 09/15/2013   Left shoulder pain - seeing Dr. Lorin Mercy 08/20/2013   Hypertension     Past Medical History:  Diagnosis Date   Anxiety    Breast cancer ([place]) 2015   Right Breast Cancer   Cancer of right breast ([place]) 09/03/13   Invasive Ductal Carcinoma/Ductal Carcinoma Insitu  biopsies  Chronic low back pain 01/24/2016   -and R hip pain -seen by rheumatologist and ortho in the past -MRI with Dr. Lorin Mercy in 2015, R hip plain films in 2015 as well acupuncture, injections, tramadol not helpful trigger therapy at [facility] and PT helpful    Degenerative joint disease    back neck and shoulder   Fibromyalgia    Dr. Estanislado Pandy   GERD (gastroesophageal reflux disease)    hx esophagitis   Hot flashes    Hypertension    Leukopenia 06/16/2013   Lupus [facility])    rheumatologist - Dr. Estanislado Pandy   Migraines    Neuromuscular disorder [facility])    neuropathy from chemo in feet/hands   Personal history of chemotherapy    2015   Personal history of radiation therapy 2015   Right Breast Cancer   S/P radiation therapy 04/27/2014-06/07/2014   1) Right breast, 50 Gy in 25 fractions/ 2) Right breast boost, 10 Gy in 5 fractions    Family History  Problem Relation Age of Onset   Hypertension Mother    Dementia Mother        small vessel disease   Cancer Mother 60       cervical  cancer   Cancer Father 56       prostate ca   Diabetes Father    Arthritis Father    Breast cancer Neg Hx    Past Surgical History:  Procedure Laterality Date   BREAST BIOPSY Left 2015   benign core   BREAST BIOPSY Right 2015   malignant core    BREAST LUMPECTOMY Right 2015   BREAST SURGERY  03/01/2014   hx right breast cancer- 3 lymph nodes removed and lumpectomy   CARPAL TUNNEL RELEASE  1998   rt   CESAREAN SECTION     x 3   PORTACATH PLACEMENT  08/2013   still currently active    ROBOTIC ASSISTED BILATERAL SALPINGO OOPHERECTOMY Bilateral 07/09/2014   Procedure: ROBOTIC ASSISTED BILATERAL SALPINGO OOPHORECTOM; :LYSIS OF ADHESIONS;  Surgeon: Marvene Staff, MD;  Location: [facility];  Service: Gynecology;  Laterality: Bilateral;   TRIGGER FINGER RELEASE  2004   rt thumb   trigger finger release left thumb  06/28/14   at [facility]opedic surgical center   [place] EXTRACTION     Social History   Social History Narrative   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      Home Situation: lives with husband and 6 yo son      Spiritual Beliefs: Christian      Lifestyle: elliptical 3-4  times per week; working on diet as well over last year in 2014            Immunization History  Administered Date(s) Administered   Influenza Split 06/16/2012   Influenza,inj,Quad PF,6+ Mos 06/17/2013, 04/23/2014, 04/25/2015, 05/17/2017, 05/05/2019   Pneumococcal Polysaccharide-23 02/16/2015     Objective: Vital Signs: BP 127/74 (BP Location: Left Arm, Patient Position: Sitting, Cuff Size: Normal)    Pulse 95    Resp 16    Ht 5\' 4"  (1.626 m)    Wt 180 lb 6.4 oz (81.8 kg)    LMP 09/28/2013 (Approximate)    BMI 30.97 kg/m    Physical Exam Vitals signs and nursing note reviewed.  Constitutional:      Appearance: She is well-developed.  HENT:     Head: Normocephalic and atraumatic.  Eyes:     Conjunctiva/sclera: Conjunctivae  normal.  Neck:     Musculoskeletal: Normal range of motion.  Cardiovascular:     Rate and Rhythm: Normal rate and regular rhythm.     Heart sounds: Normal heart sounds.  Pulmonary:     Effort: Pulmonary effort is normal.     Breath sounds: Normal breath sounds.  Abdominal:     General: Bowel sounds are normal.     Palpations: Abdomen is soft.  Lymphadenopathy:     Cervical: No cervical adenopathy.  Skin:    General: Skin is warm and dry.     Capillary Refill: Capillary refill takes less than 2 seconds.  Neurological:     Mental Status: She is alert and oriented to person, place, and time.  Psychiatric:        Behavior: Behavior normal.      Musculoskeletal Exam: C-spine, thoracic spine, and lumbar spine good ROM.  No trapezius muscle tension or tenderness.  No midline spinal tenderness.  No SI joint tenderness.  Shoulder joints, elbow joints, wrist joints, MCPs, PIPs, and DIPs good ROM with no synovitis.  PIP and DIP synovial thickening consistent with osteoarthritis of both hands.  Hip joints, knee joints, ankle joints, MTPs, PIPs and DIPs good ROM with no synovitis.  No warmth or effusion of knee joints.  No tenderness or swelling of ankle joints.     CDAI Exam: CDAI Score: -- Patient Global: --; Provider Global: -- Swollen: --; Tender: -- Joint Exam   No joint exam has been documented for this visit   There is currently no information documented on the homunculus. Go to the Rheumatology activity and complete the homunculus joint exam.  Investigation: No additional findings.  Imaging: No results found.  Recent Labs: Lab Results  Component Value Date   WBC 2.8 (L) 04/27/2019   HGB 12.2 04/27/2019   PLT 252 04/27/2019   NA 142 04/27/2019   K 3.9 04/27/2019   CL 104 04/27/2019   CO2 27 04/27/2019   GLUCOSE 90 04/27/2019   BUN 12 04/27/2019   CREATININE 1.00 04/27/2019   BILITOT 0.6 04/27/2019   ALKPHOS 54 10/11/2016   AST 27 04/27/2019   ALT 17 04/27/2019   PROT 7.8 04/27/2019   ALBUMIN 4.3 10/11/2016   CALCIUM 9.7 04/27/2019   GFRAA 73 04/27/2019    Speciality Comments: No specialty comments available.  Procedures:  No procedures performed Allergies: Adhesive [tape], Other, and Nickel   Assessment / Plan:     Visit Diagnoses: Autoimmune disease ([place]) - + ANA 1:320 Speck[place]-patient had past Hx of neutropenia, fatigue, oral ulcers, sicca symptoms, parotitis, positive anti-CCP: She is clinically doing well on Plaquenil 200 mg 1 tablet in the morning and 1/2 tablet in the evening.  She has noticed improvement since restarting on PLQ in August 2020.  The right wrist extensor tenosynovitis has resolved.  She is not having any joint pain or joint swelling at this time.  Her level of fatigue has improved and she has not had any fevers or enlarged lymph nodes.  She has not had any recent rashes, photosensitivity, hair loss, or symptoms of Raynaud's.  No malar rash noted.  No digital ulcerations or signs of gangrene. She has chronic sicca symptoms, especially mouth dryness but no parotid swelling or tenderness noted.  She will continue taking PLQ as prescribed.  She will return for an office visit and lab work in February  2021.  She was advised to notify us if she develops any new or worsening symptoms.  High risk medication use - Plaquenil 200 mg 1 tablet in the morning and 1/2 tablet in the evening restarted in August 2020. No Plaquenil eye exam on file. She will be scheduling an updated eye exam. Most recent CBC/CMP within normal except for low WBC on 04/27/2019. She will be due to update CBC and CMP in February and every 5 months to monitor for drug toxicity.   Cyclic citrullinated peptide (CCP) antibody positive - She was experiencing extensor tenosynovitis of the right wrist joint previously, and we discussed scheduling ultrasound-guided cortisone injection when she is cleared by her oncologist but her symptoms have resolved.  She has no tenderness or inflammation at this time.   Fibromyalgia: She continues to have generalized muscle aches and muscle tenderness due to fibromyalgia.  Her discomfort has been manageable.  She has trigger point injections on 04/27/19 which resolved the muscle spasms and muscle tension she was experiencing.  She would like a referral to integrative therapy for further management.    Trapezius muscle spasm: Resolved.  She had trigger point injection on 04/27/19.   Primary osteoarthritis of both hands: She has PIP and DIP synovial thickening consistent with osteoarthritis of both hands.  She has no tenderness or synovitis on exam.  She has complete fist formation bilaterally.  Joint protection and muscle strengthening were discussed.   Primary osteoarthritis of both hips: She has slightly limited ROM with no discomfort at this time.   Primary osteoarthritis of both feet: She has no feet pain at this time.   DDD (degenerative disc disease), cervical: She has good ROM with no discomfort at this time. She has no symptoms of radiculopathy.   Chronic right SI joint pain: Resolved   Osteopenia, unspecified location  Malignant neoplasm of lower-outer quadrant of right breast of female,  estrogen receptor positive ([place])  Peripheral neuropathy due to chemotherapy [facility])  Essential hypertension  Orders: Orders Placed This Encounter  Procedures   Ambulatory referral to Physical Therapy   Meds ordered this encounter  Medications   hydroxychloroquine (PLAQUENIL) 200 MG tablet    Sig: Take 1 tablet (200 mg) by mouth in the morning and 1/2 tablet by mouth in the evening daily.    Dispense:  135 tablet    Refill:  0      Follow-Up Instructions: Return in 3 months (on 09/08/2019) for Autoimmune Disease, Osteoarthritis, Fibromyalgia.   Ofilia Neas, PA-C  Note - This record has been created using Dragon software.  Chart creation errors have been sought, but may not always  have been located. Such creation errors do not reflect on  the standard of medical care.

## 2019-05-27 ENCOUNTER — Other Ambulatory Visit: Payer: BC Managed Care – PPO | Admitting: Rheumatology

## 2019-06-08 ENCOUNTER — Encounter: Payer: Self-pay | Admitting: Physician Assistant

## 2019-06-08 ENCOUNTER — Other Ambulatory Visit: Payer: Self-pay

## 2019-06-08 ENCOUNTER — Ambulatory Visit (INDEPENDENT_AMBULATORY_CARE_PROVIDER_SITE_OTHER): Payer: BC Managed Care – PPO | Admitting: Physician Assistant

## 2019-06-08 VITALS — BP 127/74 | HR 95 | Resp 16 | Ht 64.0 in | Wt 180.4 lb

## 2019-06-08 DIAGNOSIS — R7989 Other specified abnormal findings of blood chemistry: Secondary | ICD-10-CM | POA: Diagnosis not present

## 2019-06-08 DIAGNOSIS — M62838 Other muscle spasm: Secondary | ICD-10-CM | POA: Diagnosis not present

## 2019-06-08 DIAGNOSIS — M359 Systemic involvement of connective tissue, unspecified: Secondary | ICD-10-CM | POA: Diagnosis not present

## 2019-06-08 DIAGNOSIS — M19071 Primary osteoarthritis, right ankle and foot: Secondary | ICD-10-CM

## 2019-06-08 DIAGNOSIS — G620 Drug-induced polyneuropathy: Secondary | ICD-10-CM

## 2019-06-08 DIAGNOSIS — Z170 Estrogen receptor positive status [ER+]: Secondary | ICD-10-CM

## 2019-06-08 DIAGNOSIS — M797 Fibromyalgia: Secondary | ICD-10-CM | POA: Diagnosis not present

## 2019-06-08 DIAGNOSIS — C50511 Malignant neoplasm of lower-outer quadrant of right female breast: Secondary | ICD-10-CM | POA: Diagnosis not present

## 2019-06-08 DIAGNOSIS — M19072 Primary osteoarthritis, left ankle and foot: Secondary | ICD-10-CM

## 2019-06-08 DIAGNOSIS — M160 Bilateral primary osteoarthritis of hip: Secondary | ICD-10-CM | POA: Diagnosis not present

## 2019-06-08 DIAGNOSIS — M19041 Primary osteoarthritis, right hand: Secondary | ICD-10-CM

## 2019-06-08 DIAGNOSIS — G8929 Other chronic pain: Secondary | ICD-10-CM

## 2019-06-08 DIAGNOSIS — M533 Sacrococcygeal disorders, not elsewhere classified: Secondary | ICD-10-CM

## 2019-06-08 DIAGNOSIS — M19042 Primary osteoarthritis, left hand: Secondary | ICD-10-CM

## 2019-06-08 DIAGNOSIS — M5030 Other cervical disc degeneration, unspecified cervical region: Secondary | ICD-10-CM | POA: Diagnosis not present

## 2019-06-08 DIAGNOSIS — I10 Essential (primary) hypertension: Secondary | ICD-10-CM

## 2019-06-08 DIAGNOSIS — Z79899 Other long term (current) drug therapy: Secondary | ICD-10-CM | POA: Diagnosis not present

## 2019-06-08 DIAGNOSIS — M8580 Other specified disorders of bone density and structure, unspecified site: Secondary | ICD-10-CM

## 2019-06-08 DIAGNOSIS — T451X5A Adverse effect of antineoplastic and immunosuppressive drugs, initial encounter: Secondary | ICD-10-CM

## 2019-06-08 DIAGNOSIS — R768 Other specified abnormal immunological findings in serum: Secondary | ICD-10-CM

## 2019-06-08 MED ORDER — HYDROXYCHLOROQUINE SULFATE 200 MG PO TABS: ORAL_TABLET | ORAL | 0 refills | Status: DC

## 2019-06-08 NOTE — Patient Instructions (Signed)
Standing Labs We placed an order today for your standing lab work.    Please come back and get your standing labs in February and every 5 months   We have open lab daily Monday through Thursday from 8:30-12:30 PM and 1:30-4:30 PM and Friday from 8:30-12:30 PM and 1:30-4:00 PM at the office of Dr. [name].   You may experience shorter wait times on Monday and Friday afternoons. The office is located at [address] [place], [place], [place] [address] No appointment is necessary.   Labs are drawn by [facility].  You may receive a bill from [facility] for your lab work.  If you wish to have your labs drawn at another location, please call the office 24 hours in advance to send orders.  If you have any questions regarding directions or hours of operation,  please call [phone].   Just as a reminder please drink plenty of water prior to coming for your lab work. Thanks!

## 2019-06-18 ENCOUNTER — Ambulatory Visit: Payer: Medicare Other | Admitting: Dietician

## 2019-06-23 NOTE — Progress Notes (Signed)
Greater than 5 minutes, yet less than 10 minutes of time have been spent researching, coordinating, and implementing care for this patient today.  Thank you for the details you included in the comment boxes. Those details are very helpful in determining the best course of treatment for you and help us to provide the best care.  

## 2019-06-28 ENCOUNTER — Other Ambulatory Visit: Payer: Self-pay | Admitting: Hematology and Oncology

## 2019-06-28 DIAGNOSIS — C50511 Malignant neoplasm of lower-outer quadrant of right female breast: Secondary | ICD-10-CM

## 2019-06-30 ENCOUNTER — Ambulatory Visit: Payer: BC Managed Care – PPO | Admitting: Physical Therapy

## 2019-08-03 ENCOUNTER — Other Ambulatory Visit: Payer: Self-pay | Admitting: Hematology and Oncology

## 2019-08-03 DIAGNOSIS — C50511 Malignant neoplasm of lower-outer quadrant of right female breast: Secondary | ICD-10-CM

## 2019-08-04 ENCOUNTER — Other Ambulatory Visit: Payer: Self-pay | Admitting: Hematology and Oncology

## 2019-08-04 DIAGNOSIS — C50511 Malignant neoplasm of lower-outer quadrant of right female breast: Secondary | ICD-10-CM

## 2019-08-04 NOTE — Telephone Encounter (Signed)
Okay to refill Omeprazole? Last OV was 04/2019, scheduled for CPE in 08/2019.

## 2019-08-04 NOTE — Telephone Encounter (Signed)
Message routed to PCP CMA  

## 2019-08-04 NOTE — Telephone Encounter (Signed)
Pt called in and stated that Cancer Dr released her and would like to know if Dr Martinique could call this in for her ?   Pharmacy - [facility] on file

## 2019-08-05 ENCOUNTER — Other Ambulatory Visit: Payer: Self-pay | Admitting: Family Medicine

## 2019-08-05 DIAGNOSIS — C50511 Malignant neoplasm of lower-outer quadrant of right female breast: Secondary | ICD-10-CM

## 2019-08-05 MED ORDER — OMEPRAZOLE 40 MG PO CPDR: 40.0000 mg | DELAYED_RELEASE_CAPSULE | Freq: Every day | ORAL | 0 refills | Status: DC

## 2019-08-05 NOTE — Telephone Encounter (Signed)
I called and spoke with pt, she is aware Rx was sent into pharmacy.

## 2019-08-05 NOTE — Telephone Encounter (Signed)
Rx sent. Thanks, BJ 

## 2019-08-05 NOTE — Telephone Encounter (Signed)
Awaiting approval from PCP

## 2019-08-05 NOTE — Telephone Encounter (Signed)
Pt called in to follow up on refill request. Pt says that she has been out for 3 days and would like to have Rx as soon as possible .

## 2019-08-19 ENCOUNTER — Ambulatory Visit: Payer: BC Managed Care – PPO | Admitting: Physician Assistant

## 2019-09-07 ENCOUNTER — Other Ambulatory Visit: Payer: Self-pay

## 2019-09-07 NOTE — Progress Notes (Signed)
Office Visit Note  Patient: Tina [name]             Date of Birth: Aug 06, 1960           MRN: [id number]             PCP: Martinique, Betty G, MD Referring: Martinique, Betty G, MD Visit Date: 09/10/2019 Occupation: @GUAROCC @  Subjective:  Muscle tension   History of Present Illness: Tina [name] is a 59 y.o. female with history of autoimmune disease and fibromyalgia.  She is taking plaquenil 200 mg 1 tablet by mouth in the morning and 1/2 tablet in the evening.  She denies any signs or symptoms of an autoimmune disease flare.  She denies any joint pain or joint swelling.  She has not had any recent rashes, symptoms of Raynaud's, oral or nasal ulcerations.  She reports chronic sicca symptoms.  She denies any enlarged lymph nodes.     She has generalized muscle tension and tenderness due to fibromyalgia. She is having trapezius muscle tension and tenderness. She states the trigger point injection on 04/27/19 were helpful.    Activities of Daily Living:  Patient reports morning stiffness for 20  minutes.   Patient Reports nocturnal pain.  Difficulty dressing/grooming: Denies Difficulty climbing stairs: Denies Difficulty getting out of chair: Denies Difficulty using hands for taps, buttons, cutlery, and/or writing: Denies  Review of Systems  Constitutional: Positive for fatigue.  HENT: Positive for mouth dryness. Negative for mouth sores and nose dryness.   Eyes: Positive for dryness. Negative for pain and visual disturbance.  Respiratory: Negative for cough, hemoptysis, shortness of breath and difficulty breathing.   Cardiovascular: Negative for chest pain, palpitations, hypertension and swelling in legs/feet.  Gastrointestinal: Negative for blood in stool, constipation and diarrhea.  Endocrine: Negative for increased urination.  Genitourinary: Negative for painful urination.  Musculoskeletal: Positive for myalgias, morning stiffness, muscle tenderness and myalgias. Negative for  arthralgias, joint pain, joint swelling and muscle weakness.  Skin: Negative for color change, pallor, rash, hair loss, nodules/bumps, skin tightness, ulcers and sensitivity to sunlight.  Allergic/Immunologic: Negative for susceptible to infections.  Neurological: Negative for dizziness, numbness, headaches and weakness.  Hematological: Negative for swollen glands.  Psychiatric/Behavioral: Negative for depressed mood and sleep disturbance. The patient is not nervous/anxious.     PMFS History:  Patient Active Problem List   Diagnosis Date Noted  . Controlled type 2 diabetes mellitus with complication, without long-term current use of insulin ([place]) 05/07/2019  . Hyperlipidemia 05/05/2019  . Obesity (BMI 30-39.9) 05/05/2019  . Fibromyalgia 02/28/2016  . Chronic low back pain 01/24/2016  . Lupus ([place]) 04/19/2015  . GERD (gastroesophageal reflux disease) 04/19/2015  . Cervicogenic headache 02/10/2015  . Neck pain 02/10/2015  . Hot flashes 10/26/2014  . GAD (generalized anxiety disorder) 10/26/2014  . Peripheral neuropathy due to chemotherapy ([place]) 03/12/2014  . Breast cancer of lower-outer quadrant of right female breast ([place]) 09/15/2013  . Left shoulder pain - seeing Dr. Lorin Mercy 08/20/2013  . Hypertension     Past Medical History:  Diagnosis Date  . Anxiety   . Breast cancer ([place]) 2015   Right Breast Cancer  . Cancer of right breast ([place]) 09/03/13   Invasive Ductal Carcinoma/Ductal Carcinoma Insitu  biopsies  . Chronic low back pain 01/24/2016   -and R hip pain -seen by rheumatologist and ortho in the past -MRI with Dr. Lorin Mercy in 2015, R hip plain films in 2015 as well acupuncture, injections, tramadol not helpful trigger therapy at  [facility] and PT helpful   . Degenerative joint disease    back neck and shoulder  . Fibromyalgia    Dr. Estanislado Pandy  . GERD (gastroesophageal reflux disease)    hx esophagitis  . Hot flashes   . Hypertension   . Leukopenia 06/16/2013  . Lupus  [facility])    rheumatologist - Dr. Estanislado Pandy  . Migraines   . Neuromuscular disorder ([place])    neuropathy from chemo in feet/hands  . Personal history of chemotherapy    2015  . Personal history of radiation therapy 2015   Right Breast Cancer  . S/P radiation therapy 04/27/2014-06/07/2014   1) Right breast, 50 Gy in 25 fractions/ 2) Right breast boost, 10 Gy in 5 fractions    Family History  Problem Relation Age of Onset  . Hypertension Mother   . Dementia Mother        small vessel disease  . Cancer Mother 30       cervical  cancer  . Cancer Father 23       prostate ca  . Diabetes Father   . Arthritis Father   . Cancer Half-Sister   . Breast cancer Neg Hx    Past Surgical History:  Procedure Laterality Date  . BREAST BIOPSY Left 2015   benign core  . BREAST BIOPSY Right 2015   malignant core   . BREAST LUMPECTOMY Right 2015  . BREAST SURGERY  03/01/2014   hx right breast cancer- 3 lymph nodes removed and lumpectomy  . [place]   rt  . CESAREAN SECTION     x 3  . PORTACATH PLACEMENT  08/2013   still currently active   . ROBOTIC ASSISTED BILATERAL SALPINGO OOPHERECTOMY Bilateral 07/09/2014   Procedure: ROBOTIC ASSISTED BILATERAL SALPINGO OOPHORECTOM; :LYSIS OF ADHESIONS;  Surgeon: Marvene Staff, MD;  Location: [facility];  Service: Gynecology;  Laterality: Bilateral;  . TRIGGER FINGER RELEASE  2004   rt thumb  . trigger finger release left thumb  06/28/14   at orthopedic surgical center  . TUBAL LIGATION    . WISDOM TOOTH EXTRACTION     Social History   Social History Narrative   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      Home Situation: lives with husband and 71 yo son      Spiritual Beliefs: Christian      Lifestyle: elliptical 3-4 times per week; working on diet as well over last year in 2014            Immunization History  Administered Date(s) Administered  . Influenza Split 06/16/2012  . Influenza,inj,Quad PF,6+ Mos 06/17/2013, 04/23/2014,  04/25/2015, 05/17/2017, 05/05/2019  . Pneumococcal Polysaccharide-23 02/16/2015     Objective: Vital Signs: BP (!) 144/83 (BP Location: Left Arm, Patient Position: Sitting, Cuff Size: Small)   Pulse 95   Resp 12   Ht 5\' 4"  (1.626 m)   Wt 181 lb 9.6 oz (82.4 kg)   LMP 09/28/2013 (Approximate)   BMI 31.17 kg/m    Physical Exam Vitals and nursing note reviewed.  Constitutional:      Appearance: She is well-developed.  HENT:     Head: Normocephalic and atraumatic.  Eyes:     Conjunctiva/sclera: Conjunctivae normal.  Cardiovascular:     Rate and Rhythm: Normal rate and regular rhythm.     Heart sounds: Normal heart sounds.  Pulmonary:     Effort: Pulmonary effort is normal.     Breath sounds: Normal  breath sounds.  Abdominal:     General: Bowel sounds are normal.     Palpations: Abdomen is soft.  Musculoskeletal:     Cervical back: Normal range of motion.  Lymphadenopathy:     Cervical: No cervical adenopathy.  Skin:    General: Skin is warm and dry.     Capillary Refill: Capillary refill takes less than 2 seconds.  Neurological:     Mental Status: She is alert and oriented to person, place, and time.  Psychiatric:        Behavior: Behavior normal.      Musculoskeletal Exam: C-spine, thoracic spine, lumbar spine good range of motion.  No midline spinal tenderness.  No SI joint tenderness.  Shoulder joints, elbow joints, wrist joints, MCPs, PIPs, and DIPs good ROM with no synovitis. Complete fist formation bilaterally. Hip joints, knee joints, ankle joints, MTPs, PIPs, and DIPs good ROM with no synovitis.  No warmth or effusion of knee joints noted.  No tenderness or swelling of ankle joints.    CDAI Exam: CDAI Score: -- Patient Global: --; Provider Global: -- Swollen: --; Tender: -- Joint Exam 09/10/2019   No joint exam has been documented for this visit   There is currently no information documented on the homunculus. Go to the Rheumatology activity and complete the  homunculus joint exam.  Investigation: No additional findings.  Imaging: No results found.  Recent Labs: Lab Results  Component Value Date   WBC 2.8 (L) 04/27/2019   HGB 12.2 04/27/2019   PLT 252 04/27/2019   NA 140 09/08/2019   K 3.9 09/08/2019   CL 103 09/08/2019   CO2 32 09/08/2019   GLUCOSE 100 (H) 09/08/2019   BUN 11 09/08/2019   CREATININE 1.07 09/08/2019   BILITOT 0.6 04/27/2019   ALKPHOS 54 10/11/2016   AST 27 04/27/2019   ALT 17 04/27/2019   PROT 7.8 04/27/2019   ALBUMIN 4.3 10/11/2016   CALCIUM 9.3 09/08/2019   GFRAA 73 04/27/2019    Speciality Comments: No specialty comments available.  Procedures:  No procedures performed Allergies: Adhesive [tape], Other, and Nickel   Assessment / Plan:     Visit Diagnoses: Autoimmune disease ([place]) -  + ANA 1:320 [place]-patient had past Hx of neutropenia, fatigue, oral ulcers, sicca symptoms, parotitis, positive anti-CCP: She has not had any signs or symptoms of a flare recently.  She is clinically doing well on Plaquenil 200 mg 1 tablet in the morning and half tablet in the evening.  She has not missed any doses recently.  She has no synovitis on exam.  She has not had any recent rashes, hair loss, or photosensitivity.  She experiences intermittent symptoms of Raynaud's and chronic sicca symptoms.  She has not had any oral nasal ulcerations recently.  No enlarged lymph nodes were noted on exam.  She will continue taking Plaquenil as prescribed.  A refill was sent to the pharmacy today.  We will obtain autoimmune lab work today.  She was advised to notify us if develops any new or worsening symptoms.  She will follow-up in the office in 5 months.- Plan: Anti-DNA antibody, double-stranded, C3 and C4, Sedimentation rate, Urinalysis, Routine w reflex microscopic  High risk medication use - Plaquenil 200 mg 1 tablet in the morning and 1/2 tablet in the evening restarted in August 2020.  She was advised to schedule a Plaquenil eye  exam.  She was given a Plaquenil eye exam form to take with her to her upcoming appointment.  CBC and hepatic panel will be checked today.- Plan: CBC with Differential/Platelet, Hepatic function panel  Cyclic citrullinated peptide (CCP) antibody positive: She has no synovitis on exam.   Fibromyalgia: She has generalized muscle aches muscle tenderness due to fibromyalgia.  She presents today with trapezius muscle tension and muscle tenderness bilaterally.  She had trigger point injections on 04/27/2019 which provided significant relief.  We discussed massage therapy and trying dry needling in the future.  Discussed importance of regular exercise and good sleep hygiene.   Trapezius muscle spasm: She has trapezius muscle tension and muscle tenderness bilaterally.  She had trigger point injections in the past which were effective.  She is planning on scheduling a massage.  Primary osteoarthritis of both hands: She has no tenderness or synovitis on exam.    Primary osteoarthritis of both hips: She has good range of motion with no discomfort.  Primary osteoarthritis of both feet: She is not experiencing any feet pain or inflammation at this time.  DDD (degenerative disc disease), cervical: She has chronic neck pain and stiffness.  She has no symptoms of radiculopathy at this time.  Chronic right SI joint pain: She experiences intermittent right SI joint pain.  Osteopenia, unspecified location: According to the patient her gynecologist has been ordering her bone densities.  She will discuss repeat DEXA at her upcoming office visit.  Other medical conditions are listed as follows:   Malignant neoplasm of lower-outer quadrant of right breast of female, estrogen receptor positive ([place])  Essential hypertension  Peripheral neuropathy due to chemotherapy [facility])  Orders: Orders Placed This Encounter  Procedures  . CBC with Differential/Platelet  . Hepatic function panel  . Anti-DNA antibody,  double-stranded  . C3 and C4  . Sedimentation rate  . Urinalysis, Routine w reflex microscopic   Meds ordered this encounter  Medications  . hydroxychloroquine (PLAQUENIL) 200 MG tablet    Sig: Take 1 tablet (200 mg) by mouth in the morning and 1/2 tablet by mouth in the evening daily.    Dispense:  135 tablet    Refill:  0      Follow-Up Instructions: Return in about 5 months (around 02/07/2020) for Autoimmune Disease.   Ofilia Neas, PA-C  Note - This record has been created using Dragon software.  Chart creation errors have been sought, but may not always  have been located. Such creation errors do not reflect on  the standard of medical care.

## 2019-09-08 ENCOUNTER — Ambulatory Visit (INDEPENDENT_AMBULATORY_CARE_PROVIDER_SITE_OTHER): Payer: BC Managed Care – PPO | Admitting: Family Medicine

## 2019-09-08 ENCOUNTER — Encounter: Payer: Self-pay | Admitting: Family Medicine

## 2019-09-08 VITALS — BP 136/80 | HR 97 | Resp 12 | Ht 64.0 in | Wt 181.0 lb

## 2019-09-08 DIAGNOSIS — Z0000 Encounter for general adult medical examination without abnormal findings: Secondary | ICD-10-CM

## 2019-09-08 DIAGNOSIS — Z170 Estrogen receptor positive status [ER+]: Secondary | ICD-10-CM

## 2019-09-08 DIAGNOSIS — I10 Essential (primary) hypertension: Secondary | ICD-10-CM

## 2019-09-08 DIAGNOSIS — T451X5A Adverse effect of antineoplastic and immunosuppressive drugs, initial encounter: Secondary | ICD-10-CM

## 2019-09-08 DIAGNOSIS — M62838 Other muscle spasm: Secondary | ICD-10-CM

## 2019-09-08 DIAGNOSIS — C50511 Malignant neoplasm of lower-outer quadrant of right female breast: Secondary | ICD-10-CM

## 2019-09-08 DIAGNOSIS — G620 Drug-induced polyneuropathy: Secondary | ICD-10-CM | POA: Diagnosis not present

## 2019-09-08 DIAGNOSIS — M359 Systemic involvement of connective tissue, unspecified: Secondary | ICD-10-CM

## 2019-09-08 DIAGNOSIS — R232 Flushing: Secondary | ICD-10-CM

## 2019-09-08 DIAGNOSIS — E785 Hyperlipidemia, unspecified: Secondary | ICD-10-CM

## 2019-09-08 DIAGNOSIS — E118 Type 2 diabetes mellitus with unspecified complications: Secondary | ICD-10-CM | POA: Diagnosis not present

## 2019-09-08 LAB — LIPID PANEL
Cholesterol: 262 mg/dL — ABNORMAL HIGH (ref 0–200)
HDL: 63.8 mg/dL (ref >39.00)
LDL Cholesterol: 159 mg/dL — ABNORMAL HIGH (ref 0–99)
NonHDL: 198.35
Total CHOL/HDL Ratio: 4
Triglycerides: 199 mg/dL — ABNORMAL HIGH (ref 0.0–149.0)
VLDL: 39.8 mg/dL (ref 0.0–40.0)

## 2019-09-08 LAB — HEMOGLOBIN A1C: Hgb A1c MFr Bld: 6.2 % (ref 4.6–6.5)

## 2019-09-08 LAB — BASIC METABOLIC PANEL
BUN: 11 mg/dL (ref 6–23)
CO2: 32 mEq/L (ref 19–32)
Calcium: 9.3 mg/dL (ref 8.4–10.5)
Chloride: 103 mEq/L (ref 96–112)
Creatinine, Ser: 1.07 mg/dL (ref 0.40–1.20)
GFR: 63.97 mL/min (ref >60.00)
Glucose, Bld: 100 mg/dL — ABNORMAL HIGH (ref 70–99)
Potassium: 3.9 mEq/L (ref 3.5–5.1)
Sodium: 140 mEq/L (ref 135–145)

## 2019-09-08 MED ORDER — LISINOPRIL-HYDROCHLOROTHIAZIDE 20-12.5 MG PO TABS: ORAL_TABLET | ORAL | 2 refills | Status: DC

## 2019-09-08 MED ORDER — VENLAFAXINE HCL ER 150 MG PO CP24: ORAL_CAPSULE | ORAL | 2 refills | Status: DC

## 2019-09-08 NOTE — Progress Notes (Signed)
HPI:   [name] is a 59 y.o. female, who is here today for her routine physical. Last CPE: 09/29/18 She was last seen on 05/05/19.  Regular exercise 3 or more time per week: Walking for about 45 min daily. Following a healthy diet: Yes. She loves [place] fries and fried fish. She lives with son. Her husband moved out.  Chronic medical problems: Lupus, hypertension, DM 2, anxiety, GERD, and hyperlipidemia among some. She follows with rheumatologist every 3 to 4 months. History of malignant neoplasm of lower-outer quadrant of right breast, estrogen receptor positive.Dx'ed in 2015. According to pt,she had her last f/u visit in 03/2019.  She has followed with Dr. Lindi Adie. Pap smear: 09/29/18. Follows with Dr. Maudry Diego, gynecologist.  Immunization History  Administered Date(s) Administered  . Influenza Split 06/16/2012  . Influenza,inj,Quad PF,6+ Mos 06/17/2013, 04/23/2014, 04/25/2015, 05/17/2017, 05/05/2019  . Pneumococcal Polysaccharide-23 02/16/2015   Mammogram: 09/10/2018. Colonoscopy: 01/02/2016. DEXA: N/A  Hep C screening : 03/29/17 NR  Hyperlipidemia: She declined statin medication. She is trying to do better with following low-fat diet.  Lab Results  Component Value Date   CHOL 266 (H) 05/05/2019   HDL 74.10 05/05/2019   LDLCALC 162 (H) 05/05/2019   LDLDIRECT 123.0 10/26/2014   TRIG 151.0 (H) 05/05/2019   CHOLHDL 4 05/05/2019   DM2: She is on nonpharmacologic treatment. She is not checking BS. Denies polydipsia,polyuria, or polyphagia. Stable numbness and tingling LE's.  + Peripheral neuropathy s/p chemotherapy.  Currently she is on gabapentin 300 mg 3 times daily.  She follows with Dr. Delice Lesch.   HGBA1C 6.5 05/05/2019    C/O Muscle tension,spasms, and achiness. She does not feel like gabapentin is helping with this problem.  She has tried muscle relaxant in the past, they do not help, just caused drowsiness. Pain is severe, exacerbated by  movement.  Negative for recent trauma. She has not noted erythema or edema on affected areas.  Autoimmune disease, fibromyalgia, and OA.  She follows with rheumatologist, Dr.Deveshwar.  She is also complaining about constipation, this is a chronic problem. She has tried OTC "detox tea."  Requesting refills on Effexor XR 150 mg , which she takes for anxiety and hot flashes. Medication is helping.   Review of Systems  Constitutional: Positive for fatigue. Negative for activity change, appetite change and fever.  HENT: Negative for mouth sores, nosebleeds, sore throat and trouble swallowing.   Eyes: Negative for redness and visual disturbance.  Respiratory: Negative for cough, shortness of breath and wheezing.   Cardiovascular: Negative for chest pain, palpitations and leg swelling.  Gastrointestinal: Negative for abdominal pain, nausea and vomiting.       Negative for changes in bowel habits.  Endocrine: Negative for cold intolerance and heat intolerance.  Genitourinary: Negative for decreased urine volume and hematuria.  Musculoskeletal: Positive for arthralgias, back pain and myalgias. Negative for gait problem.  Skin: Negative for rash and wound.  Neurological: Negative for syncope, weakness and headaches.  Hematological: Negative for adenopathy. Does not bruise/bleed easily.  Psychiatric/Behavioral: Negative for confusion. The patient is nervous/anxious.   All other systems reviewed and are negative.     Current Outpatient Medications on File Prior to Visit  Medication Sig Dispense Refill  . aspirin 81 MG tablet Take 81 mg by mouth daily.    Marland Kitchen b complex vitamins capsule Take 1 capsule by mouth daily.    Marland Kitchen BIOTIN 5000 PO Take 1 capsule by mouth. Takes sometimes    . Calcium  Carbonate-Vit D-Min (CALTRATE 600+D PLUS MINERALS PO) Take 1 tablet by mouth daily.    Marland Kitchen gabapentin (NEURONTIN) 600 MG tablet Take 1 tablet (600 mg total) by mouth 3 (three) times daily. 90 tablet 11  .  hydroxychloroquine (PLAQUENIL) 200 MG tablet Take 1 tablet (200 mg) by mouth in the morning and 1/2 tablet by mouth in the evening daily. 135 tablet 0  . loratadine (CLARITIN) 10 MG tablet Take 10 mg by mouth as needed.     Marland Kitchen omeprazole (PRILOSEC) 40 MG capsule Take 1 capsule (40 mg total) by mouth daily before breakfast. 90 capsule 0  . SUMAtriptan (IMITREX) 25 MG tablet Take 1 tablet (25 mg total) by mouth every 2 (two) hours as needed for migraine. May repeat in 2 hours if needed. Do not take more then 2 tablets in 24 hours. 10 tablet 0  . TURMERIC PO Take by mouth daily.     Current Facility-Administered Medications on File Prior to Visit  Medication Dose Route Frequency Provider Last Rate Last Admin  . prochlorperazine (COMPAZINE) injection 10 mg  10 mg Intravenous Q6H PRN Gardenia Phlegm, NP   10 mg at 12/26/13 [id number]     Past Medical History:  Diagnosis Date  . Anxiety   . Breast cancer ([place]) 2015   Right Breast Cancer  . Cancer of right breast ([place]) 09/03/13   Invasive Ductal Carcinoma/Ductal Carcinoma Insitu  biopsies  . Chronic low back pain 01/24/2016   -and R hip pain -seen by rheumatologist and ortho in the past -MRI with Dr. Lorin Mercy in 2015, R hip plain films in 2015 as well acupuncture, injections, tramadol not helpful trigger therapy at [facility] and PT helpful   . Degenerative joint disease    back neck and shoulder  . Fibromyalgia    Dr. Estanislado Pandy  . GERD (gastroesophageal reflux disease)    hx esophagitis  . Hot flashes   . Hypertension   . Leukopenia 06/16/2013  . Lupus [facility])    rheumatologist - Dr. Estanislado Pandy  . Migraines   . Neuromuscular disorder ([place])    neuropathy from chemo in feet/hands  . Personal history of chemotherapy    2015  . Personal history of radiation therapy 2015   Right Breast Cancer  . S/P radiation therapy 04/27/2014-06/07/2014   1) Right breast, 50 Gy in 25 fractions/ 2) Right breast boost, 10 Gy in 5 fractions    Past  Surgical History:  Procedure Laterality Date  . BREAST BIOPSY Left 2015   benign core  . BREAST BIOPSY Right 2015   malignant core   . BREAST LUMPECTOMY Right 2015  . BREAST SURGERY  03/01/2014   hx right breast cancer- 3 lymph nodes removed and lumpectomy  . [place]   rt  . CESAREAN SECTION     x 3  . PORTACATH PLACEMENT  08/2013   still currently active   . ROBOTIC ASSISTED BILATERAL SALPINGO OOPHERECTOMY Bilateral 07/09/2014   Procedure: ROBOTIC ASSISTED BILATERAL SALPINGO OOPHORECTOM; :LYSIS OF ADHESIONS;  Surgeon: Marvene Staff, MD;  Location: [facility];  Service: Gynecology;  Laterality: Bilateral;  . TRIGGER FINGER RELEASE  2004   rt thumb  . trigger finger release left thumb  06/28/14   at orthopedic surgical center  . TUBAL LIGATION    . WISDOM TOOTH EXTRACTION      Allergies  Allergen Reactions  . Adhesive [Tape] Itching    Burned and scarred skinned  . Other  Cholroprep-causes rash  . Nickel Rash    Family History  Problem Relation Age of Onset  . Hypertension Mother   . Dementia Mother        small vessel disease  . Cancer Mother 30       cervical  cancer  . Cancer Father 26       prostate ca  . Diabetes Father   . Arthritis Father   . Breast cancer Neg Hx     Social History   Socioeconomic History  . Marital status: Married    Spouse name: Not on file  . Number of children: 1  . Years of education: Not on file  . Highest education level: Not on file  Occupational History    Comment: homemaker  Tobacco Use  . Smoking status: Former Smoker    Packs/day: 1.00    Years: 15.00    Pack years: 15.00    Types: Cigars, Cigarettes    Quit date: 02/23/2004    Years since quitting: 15.5  . Smokeless tobacco: Never Used  . Tobacco comment: remote smoking history  Substance and Sexual Activity  . Alcohol use: Yes    Alcohol/week: 0.0 standard drinks    Comment: rare  . Drug use: Yes    Comment: Marijuana as needed   .  Sexual activity: Yes  Other Topics Concern  . Not on file  Social History Narrative   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      Home Situation: lives with husband and 44 yo son      Spiritual Beliefs: Christian      Lifestyle: elliptical 3-4 times per week; working on diet as well over last year in 2014            Social Determinants of Health   Financial Resource Strain:   . Difficulty of Paying Living Expenses: Not on file  Food Insecurity:   . Worried About Charity fundraiser in the Last Year: Not on file  . Ran Out of Food in the Last Year: Not on file  Transportation Needs:   . Lack of Transportation (Medical): Not on file  . Lack of Transportation (Non-Medical): Not on file  Physical Activity:   . Days of Exercise per Week: Not on file  . Minutes of Exercise per Session: Not on file  Stress:   . Feeling of Stress : Not on file  Social Connections:   . Frequency of Communication with Friends and Family: Not on file  . Frequency of Social Gatherings with Friends and Family: Not on file  . Attends Religious Services: Not on file  . Active Member of Clubs or Organizations: Not on file  . Attends Archivist Meetings: Not on file  . Marital Status: Not on file     Vitals:   09/08/19 0814  BP: 136/80  Pulse: 97  Resp: 12  SpO2: 97%   Body mass index is 31.07 kg/m.   Wt Readings from Last 3 Encounters:  09/08/19 181 lb (82.1 kg)  06/08/19 180 lb 6.4 oz (81.8 kg)  05/05/19 181 lb 4 oz (82.2 kg)    Physical Exam  Nursing note and vitals reviewed. Constitutional: She is oriented to person, place, and time. She appears well-developed. No distress.  HENT:  Head: Normocephalic and atraumatic.  Right Ear: Hearing, tympanic membrane, external ear and ear canal normal.  Left Ear: Hearing, tympanic membrane, external ear and ear canal normal.  Mouth/Throat: Uvula is midline,  oropharynx is clear and moist and mucous membranes are normal.  Eyes: Pupils are equal,  round, and reactive to light. Conjunctivae and EOM are normal.  Neck: No tracheal deviation present. No thyromegaly present.  Cardiovascular: Normal rate and regular rhythm.  No murmur heard. Pulses:      Dorsalis pedis pulses are 2+ on the right side and 2+ on the left side.  Respiratory: Effort normal and breath sounds normal. No respiratory distress.  GI: Soft. She exhibits no mass. There is no hepatomegaly. There is no abdominal tenderness.  Genitourinary:    Genitourinary Comments: Deferred to gyn.   Musculoskeletal:        General: No edema.     Comments: No major deformity or signs of synovitis appreciated.  Lymphadenopathy:    She has no cervical adenopathy.       Right: No supraclavicular adenopathy present.       Left: No supraclavicular adenopathy present.  Neurological: She is alert and oriented to person, place, and time. She has normal strength. No cranial nerve deficit. Coordination and gait normal.  Reflex Scores:      Bicep reflexes are 2+ on the right side and 2+ on the left side.      Patellar reflexes are 2+ on the right side and 2+ on the left side. Skin: Skin is warm. No rash noted. No erythema.  Psychiatric: She has a normal mood and affect.  Well groomed, good eye contact.   ASSESSMENT AND PLAN:  [name] was here today annual physical examination.  Orders Placed This Encounter  Procedures  . Hemoglobin A1c  . Lipid panel  . Basic metabolic panel   Lab Results  Component Value Date   HGBA1C 6.2 09/08/2019   Lab Results  Component Value Date   CREATININE 1.07 09/08/2019   BUN 11 09/08/2019   NA 140 09/08/2019   K 3.9 09/08/2019   CL 103 09/08/2019   CO2 32 09/08/2019   Lab Results  Component Value Date   CHOL 262 (H) 09/08/2019   HDL 63.80 09/08/2019   LDLCALC 159 (H) 09/08/2019   LDLDIRECT 123.0 10/26/2014   TRIG 199.0 (H) 09/08/2019   CHOLHDL 4 09/08/2019    Routine general medical examination at a health care facility We  discussed the importance of regular physical activity and healthy diet for prevention of chronic illness and/or complications. Preventive guidelines reviewed. Vaccination up to date. Continue following with gyn for her female preventive care. Ca++ and vit D supplementation to continue. Next CPE in a year.  Muscle spasm She is not interested in more medications. Low impact regular exercise may help. She is interested in integrated medicine,she will let me know if she needs a referral.  Controlled type 2 diabetes mellitus with complication, without long-term current use of insulin ([place]) For now continue nonpharmacologic treatment. Possible complications of not well controlled glucose discussed. Regular exercise and healthy diet with avoidance of added sugar food intake is an important part of treatment and recommended. Annual eye exam, periodic dental and foot care recommended. F/U in 5-6 months   Hyperlipidemia She did not start pharmacologic treatment. Low-fat diet recommended.  We discussed benefits of statins in regard to CVD prevention. Recommendations in regard to statin dose will be given according to lipid panel results.  Hot flashes Problem is well controlled with Effexor XR 150 mg.  Peripheral neuropathy due to chemotherapy Continue gabapentin 300 mg 3 times daily. Appropriate foot care discussed. Following with neurologist, Dr. Delice Lesch.  Breast cancer of lower-outer quadrant of right female breast According to patient, she is no longer following with oncologist. Continue annual mammograms.  Hypertension BP adequately controlled. No changes in current management. Continue low salt diet.  Autoimmune disease [facility]) Following with rheumatologist. She needs to schedule her eye exam.   Return in 6 months (on 03/07/2020) for DM,HLD,HTN.    Amrom Ore G. [place], MD  [facility]. [place] office.

## 2019-09-08 NOTE — Assessment & Plan Note (Signed)
She did not start pharmacologic treatment. Low-fat diet recommended.  We discussed benefits of statins in regard to CVD prevention. Recommendations in regard to statin dose will be given according to lipid panel results.

## 2019-09-08 NOTE — Assessment & Plan Note (Signed)
For now continue nonpharmacologic treatment. Possible complications of not well controlled glucose discussed. Regular exercise and healthy diet with avoidance of added sugar food intake is an important part of treatment and recommended. Annual eye exam, periodic dental and foot care recommended. F/U in 5-6 months

## 2019-09-08 NOTE — Assessment & Plan Note (Signed)
BP adequately controlled. No changes in current management. Continue low salt diet. 

## 2019-09-08 NOTE — Assessment & Plan Note (Signed)
Continue gabapentin 300 mg 3 times daily. Appropriate foot care discussed. Following with neurologist, Dr. Delice Lesch.

## 2019-09-08 NOTE — Progress Notes (Deleted)
HPI:   [name] is a 59 y.o. female, who is here today for her routine physical.  Last CPE: ***  Regular exercise 3 or more time per week: Walking for about 45 min daily. Following a healthy diet: Yes. She She loves french fries and fried fish. She lives with son. Her husband moved out.  Chronic medical problems: Lupus, hypertension, DM 2, anxiety, GERD, and hyperlipidemia among some. She follows with rheumatologist every 3 to 4 months. History of malignant neoplasm of lower-outer quadrant of right breast, estrogen receptor positive.Dx'ed in 2015. According to pt,she had her last f/u visit in 03/2019.  She has followed with Dr. Lindi [name].  Pap smear: 09/29/18. Follows with Dr. Maudry [name], gynecologist.  Immunization History  Administered Date(s) Administered  . Influenza Split 06/16/2012  . Influenza,inj,Quad PF,6+ Mos 06/17/2013, 04/23/2014, 04/25/2015, 05/17/2017, 05/05/2019  . Pneumococcal Polysaccharide-23 02/16/2015   Mammogram: 09/10/2018. Colonoscopy: 01/02/2016. DEXA: N/A  Hep C screening : 03/29/17 NR  Chronic disease management:  Hyperlipidemia: She declined pharmacologic treatment. She is trying to do better with following low-fat diet.  Lab Results  Component Value Date   CHOL 266 (H) 05/05/2019   HDL 74.10 05/05/2019   LDLCALC 162 (H) 05/05/2019   LDLDIRECT 123.0 10/26/2014   TRIG 151.0 (H) 05/05/2019   CHOLHDL 4 05/05/2019   DM2: She is on nonpharmacologic treatment. She is not checking BS. Denies polydipsia,polyuria, or polyphagia.  + Peripheral neuropathy s/p chemotherapy.  Currently she is on gabapentin 300 mg 3 times daily.  She follows with Dr. Delice [name].  Lab Results  Component Value Date   HGBA1C 6.5 05/05/2019    C/O Muscle tension,spasms, and achiness. She does not feel like gabapentin is helping with this problem.  She has tried muscle relaxant in the past, they do not help, just caused drowsiness. Negative for recent trauma.  She has not noted erythema or edema on affected areas.  Autoimmune disease, fibromyalgia, and OA.  She follows with rheumatologist, Dr.Deveshwar.  She is also complaining about constipation, this is a chronic problem. She has tried OTC "detox tea."   Review of Systems  Constitutional: Negative for appetite change, fatigue and fever.  HENT: Negative for dental problem, hearing loss, mouth sores, sore throat, trouble swallowing and voice change.   Eyes: Negative for redness and visual disturbance.  Respiratory: Negative for cough, shortness of breath and wheezing.   Cardiovascular: Negative for chest pain and leg swelling.  Gastrointestinal: Negative for abdominal pain, nausea and vomiting.       No changes in bowel habits.  Endocrine: Negative for cold intolerance and heat intolerance.  Genitourinary: Negative for decreased urine volume, dysuria, hematuria, vaginal bleeding and vaginal discharge.  Musculoskeletal: Positive for arthralgias and myalgias. Negative for gait problem and joint swelling.  Skin: Negative for color change and rash.  Allergic/Immunologic: Negative for environmental allergies.  Neurological: Negative for syncope, weakness and headaches.  Hematological: Negative for adenopathy. Does not bruise/bleed easily.  Psychiatric/Behavioral: Negative for confusion and sleep disturbance. The patient is not nervous/anxious.   All other systems reviewed and are negative.  Current Outpatient Medications on File Prior to Visit  Medication Sig Dispense Refill  . aspirin 81 MG tablet Take 81 mg by mouth daily.    Marland Kitchen b complex vitamins capsule Take 1 capsule by mouth daily.    Marland Kitchen BIOTIN 5000 PO Take 1 capsule by mouth. Takes sometimes    . Calcium Carbonate-Vit D-Min (CALTRATE 600+D PLUS MINERALS PO) Take 1 tablet by  mouth daily.    Marland Kitchen gabapentin (NEURONTIN) 600 MG tablet Take 1 tablet (600 mg total) by mouth 3 (three) times daily. 90 tablet 11  . hydroxychloroquine (PLAQUENIL) 200  MG tablet Take 1 tablet (200 mg) by mouth in the morning and 1/2 tablet by mouth in the evening daily. 135 tablet 0  . lisinopril-hydrochlorothiazide (ZESTORETIC) 20-12.5 MG tablet TAKE 1 TABLET BY MOUTH DAILY ( PATIENT NEEDS AN APPOINTMENT) 90 tablet 0  . loratadine (CLARITIN) 10 MG tablet Take 10 mg by mouth as needed.     Marland Kitchen omeprazole (PRILOSEC) 40 MG capsule Take 1 capsule (40 mg total) by mouth daily before breakfast. 90 capsule 0  . SUMAtriptan (IMITREX) 25 MG tablet Take 1 tablet (25 mg total) by mouth every 2 (two) hours as needed for migraine. May repeat in 2 hours if needed. Do not take more then 2 tablets in 24 hours. 10 tablet 0  . TURMERIC PO Take by mouth daily.    Marland Kitchen venlafaxine XR (EFFEXOR-XR) 150 MG 24 hr capsule Take one tablet daily. 90 capsule 1   Current Facility-Administered Medications on File Prior to Visit  Medication Dose Route Frequency Provider Last Rate Last Admin  . prochlorperazine (COMPAZINE) injection 10 mg  10 mg Intravenous Q6H PRN Gardenia Phlegm, NP   10 mg at 12/26/13 [id number]     Past Medical History:  Diagnosis Date  . Anxiety   . Breast cancer ([place]) 2015   Right Breast Cancer  . Cancer of right breast ([place]) 09/03/13   Invasive Ductal Carcinoma/Ductal Carcinoma Insitu  biopsies  . Chronic low back pain 01/24/2016   -and R hip pain -seen by rheumatologist and ortho in the past -MRI with Dr. Lorin Mercy in 2015, R hip plain films in 2015 as well acupuncture, injections, tramadol not helpful trigger [facility] and PT helpful   . Degenerative joint disease    back neck and shoulder  . Fibromyalgia    Dr. Estanislado Pandy  . GERD (gastroesophageal reflux disease)    hx esophagitis  . Hot flashes   . Hypertension   . Leukopenia 06/16/2013  . Lupus [facility])    rheumatologist - Dr. Estanislado Pandy  . Migraines   . Neuromuscular disorder ([place])    neuropathy from chemo in feet/hands  . Personal history of chemotherapy    2015  . Personal history of  radiation therapy 2015   Right Breast Cancer  . S/P radiation therapy 04/27/2014-06/07/2014   1) Right breast, 50 Gy in 25 fractions/ 2) Right breast boost, 10 Gy in 5 fractions    Past Surgical History:  Procedure Laterality Date  . BREAST BIOPSY Left 2015   benign core  . BREAST BIOPSY Right 2015   malignant core   . BREAST LUMPECTOMY Right 2015  . BREAST SURGERY  03/01/2014   hx right breast cancer- 3 lymph nodes removed and lumpectomy  . [place]   rt  . CESAREAN SECTION     x 3  . PORTACATH PLACEMENT  08/2013   still currently active   . ROBOTIC ASSISTED BILATERAL SALPINGO OOPHERECTOMY Bilateral 07/09/2014   Procedure: ROBOTIC ASSISTED BILATERAL SALPINGO OOPHORECTOM; :LYSIS OF ADHESIONS;  Surgeon: Marvene Staff, MD;  Location: [facility];  Service: Gynecology;  Laterality: Bilateral;  . TRIGGER FINGER RELEASE  2004   rt thumb  . trigger finger release left thumb  06/28/14   at [facility]opedic surgical center  . TUBAL LIGATION    . WISDOM TOOTH  EXTRACTION      Allergies  Allergen Reactions  . Adhesive [Tape] Itching    Burned and scarred skinned  . Other     Cholroprep-causes rash  . Nickel Rash    Family History  Problem Relation Age of Onset  . Hypertension Mother   . Dementia Mother        small vessel disease  . Cancer Mother 30       cervical  cancer  . Cancer Father 56       prostate ca  . Diabetes Father   . Arthritis Father   . Breast cancer Neg Hx     Social History   Socioeconomic History  . Marital status: Married    Spouse name: Not on file  . Number of children: 1  . Years of education: Not on file  . Highest education level: Not on file  Occupational History    Comment: homemaker  Tobacco Use  . Smoking status: Former Smoker    Packs/day: 1.00    Years: 15.00    Pack years: 15.00    Types: Cigars, Cigarettes    Quit date: 02/23/2004    Years since quitting: 15.5  . Smokeless tobacco: Never Used  . Tobacco comment:  remote smoking history  Substance and Sexual Activity  . Alcohol use: Yes    Alcohol/week: 0.0 standard drinks    Comment: rare  . Drug use: Yes    Comment: Marijuana as needed   . Sexual activity: Yes  Other Topics Concern  . Not on file  Social History Narrative   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      Home Situation: lives with husband and 53 yo son      Spiritual Beliefs: Christian      Lifestyle: elliptical 3-4 times per week; working on diet as well over last year in 2014            Social Determinants of Health   Financial Resource Strain:   . Difficulty of Paying Living Expenses: Not on file  Food Insecurity:   . Worried About Charity fundraiser in the Last Year: Not on file  . Ran Out of Food in the Last Year: Not on file  Transportation Needs:   . Lack of Transportation (Medical): Not on file  . Lack of Transportation (Non-Medical): Not on file  Physical Activity:   . Days of Exercise per Week: Not on file  . Minutes of Exercise per Session: Not on file  Stress:   . Feeling of Stress : Not on file  Social Connections:   . Frequency of Communication with Friends and Family: Not on file  . Frequency of Social Gatherings with Friends and Family: Not on file  . Attends Religious Services: Not on file  . Active Member of Clubs or Organizations: Not on file  . Attends Archivist Meetings: Not on file  . Marital Status: Not on file     Vitals:   09/08/19 0814  BP: 136/80  Pulse: 97  SpO2: 97%   Body mass index is 31.07 kg/m.   Wt Readings from Last 3 Encounters:  09/08/19 181 lb (82.1 kg)  06/08/19 180 lb 6.4 oz (81.8 kg)  05/05/19 181 lb 4 oz (82.2 kg)    Physical Exam  Nursing note and vitals reviewed. Constitutional: She is oriented to person, place, and time. She appears well-developed. No distress.  HENT:  Head: Normocephalic and atraumatic.  Right Ear:  Hearing, tympanic membrane, external ear and ear canal normal.  Left Ear: Hearing,  tympanic membrane, external ear and ear canal normal.  Mouth/Throat: Uvula is midline, oropharynx is clear and moist and mucous membranes are normal.  Eyes: Pupils are equal, round, and reactive to light. Conjunctivae and EOM are normal.  Neck: No tracheal deviation present.  Cardiovascular: Normal rate and regular rhythm.  No murmur heard. Pulses:      Dorsalis pedis pulses are 2+ on the right side and 2+ on the left side.  Respiratory: Effort normal and breath sounds normal. No respiratory distress.  GI: Soft. She exhibits no mass. There is no hepatomegaly. There is no abdominal tenderness.  Genitourinary:    Genitourinary Comments: Deferred to gyn.   Musculoskeletal:        General: No edema.     Comments: No major deformity or signs of synovitis appreciated.  Lymphadenopathy:    She has no cervical adenopathy.       Right: No supraclavicular adenopathy present.       Left: No supraclavicular adenopathy present.  Neurological: She is alert and oriented to person, place, and time. She has normal strength. No cranial nerve deficit. Coordination and gait normal.  Reflex Scores:      Bicep reflexes are 2+ on the right side and 2+ on the left side.      Patellar reflexes are 2+ on the right side and 2+ on the left side. Skin: Skin is warm. No rash noted. No erythema.  Psychiatric: She has a normal mood and affect.  Well groomed, good eye contact.      ASSESSMENT AND PLAN:  [name] was here today annual physical examination.     No orders of the defined types were placed in this encounter.   There are no diagnoses linked to this encounter.    No problem-specific Assessment & Plan notes found for this encounter.            No follow-ups on file.           G. Martinique, MD  [facility]. [place] office.

## 2019-09-08 NOTE — Assessment & Plan Note (Signed)
Problem is well controlled with Effexor XR 150 mg.

## 2019-09-08 NOTE — Patient Instructions (Addendum)
Today you have you routine preventive visit.  Controlled type 2 diabetes mellitus with complication, without long-term current use of insulin (HCC) - Plan: Hemoglobin [id number], Basic metabolic panel  Hyperlipidemia, unspecified hyperlipidemia type - Plan: Lipid panel  Muscle spasm  Routine general medical examination at a health care facility   At least 150 minutes of moderate exercise per week, daily brisk walking for 15-30 min is a good exercise option. Healthy diet low in saturated (animal) fats and sweets and consisting of fresh fruits and vegetables, lean meats such as fish and white chicken and whole grains.  These are some of recommendations for screening depending of age and risk factors:   - Vaccines:  Tdap vaccine every 10 years.  Shingles vaccine recommended at age 17, could be given after 57 years of age but not sure about insurance coverage.   Pneumonia vaccines:  Pneumovax at 5. Sometimes Pneumovax is giving earlier if history of smoking, lung disease,diabetes,kidney disease among some.    Screening for diabetes: N/A.  Cervical cancer prevention:  Pap smear starts at 58 years of age and continues periodically until 58 years old in low risk women. Pap smear every 3 years between 76 and 42 years old. Pap smear every 3-5 years between women 5 and older if pap smear negative and HPV screening negative.   -Breast cancer: Mammogram: There is disagreement between experts about when to start screening in low risk asymptomatic female but recent recommendations are to start screening at 58 and not later than 57 years old , every 1-2 years and after 57 yo q 2 years. Screening is recommended until 57 years old but some women can continue screening depending of healthy issues.   Colon cancer screening: starts at 57 years old until 57 years old.  Cholesterol disorder screening: N/A.  Also recommended:  1. Dental visit- Brush and floss your teeth twice daily; visit your  dentist twice a year. 2. Eye doctor- Get an eye exam at least every 2 years. 3. Helmet use- Always wear a helmet when riding a bicycle, motorcycle, rollerblading or skateboarding. 4. Safe sex- If you may be exposed to sexually transmitted infections, use a condom. 5. Seat belts- Seat belts can save your live; always wear one. 6. Smoke/Carbon Monoxide detectors- These detectors need to be installed on the appropriate level of your home. Replace batteries at least once a year. 7. Skin cancer- When out in the sun please cover up and use sunscreen 15 SPF or higher. 8. Violence- If anyone is threatening or hurting you, please tell your healthcare provider.  9. Drink alcohol in moderation- Limit alcohol intake to one drink or less per day. Never drink and drive.

## 2019-09-08 NOTE — Assessment & Plan Note (Signed)
According to patient, she is no longer following with oncologist. Continue annual mammograms.

## 2019-09-10 ENCOUNTER — Other Ambulatory Visit: Payer: Self-pay

## 2019-09-10 ENCOUNTER — Ambulatory Visit (INDEPENDENT_AMBULATORY_CARE_PROVIDER_SITE_OTHER): Payer: BC Managed Care – PPO | Admitting: Physician Assistant

## 2019-09-10 ENCOUNTER — Encounter: Payer: Self-pay | Admitting: Physician Assistant

## 2019-09-10 VITALS — BP 144/83 | HR 95 | Resp 12 | Ht 64.0 in | Wt 181.6 lb

## 2019-09-10 DIAGNOSIS — I10 Essential (primary) hypertension: Secondary | ICD-10-CM

## 2019-09-10 DIAGNOSIS — M62838 Other muscle spasm: Secondary | ICD-10-CM

## 2019-09-10 DIAGNOSIS — R7989 Other specified abnormal findings of blood chemistry: Secondary | ICD-10-CM | POA: Diagnosis not present

## 2019-09-10 DIAGNOSIS — G620 Drug-induced polyneuropathy: Secondary | ICD-10-CM

## 2019-09-10 DIAGNOSIS — M533 Sacrococcygeal disorders, not elsewhere classified: Secondary | ICD-10-CM | POA: Diagnosis not present

## 2019-09-10 DIAGNOSIS — M19072 Primary osteoarthritis, left ankle and foot: Secondary | ICD-10-CM

## 2019-09-10 DIAGNOSIS — M19042 Primary osteoarthritis, left hand: Secondary | ICD-10-CM

## 2019-09-10 DIAGNOSIS — G8929 Other chronic pain: Secondary | ICD-10-CM

## 2019-09-10 DIAGNOSIS — Z79899 Other long term (current) drug therapy: Secondary | ICD-10-CM | POA: Diagnosis not present

## 2019-09-10 DIAGNOSIS — M359 Systemic involvement of connective tissue, unspecified: Secondary | ICD-10-CM | POA: Diagnosis not present

## 2019-09-10 DIAGNOSIS — M19071 Primary osteoarthritis, right ankle and foot: Secondary | ICD-10-CM | POA: Diagnosis not present

## 2019-09-10 DIAGNOSIS — M797 Fibromyalgia: Secondary | ICD-10-CM | POA: Diagnosis not present

## 2019-09-10 DIAGNOSIS — M8580 Other specified disorders of bone density and structure, unspecified site: Secondary | ICD-10-CM

## 2019-09-10 DIAGNOSIS — M5030 Other cervical disc degeneration, unspecified cervical region: Secondary | ICD-10-CM

## 2019-09-10 DIAGNOSIS — M19041 Primary osteoarthritis, right hand: Secondary | ICD-10-CM

## 2019-09-10 DIAGNOSIS — Z170 Estrogen receptor positive status [ER+]: Secondary | ICD-10-CM

## 2019-09-10 DIAGNOSIS — M160 Bilateral primary osteoarthritis of hip: Secondary | ICD-10-CM

## 2019-09-10 DIAGNOSIS — C50511 Malignant neoplasm of lower-outer quadrant of right female breast: Secondary | ICD-10-CM | POA: Diagnosis not present

## 2019-09-10 DIAGNOSIS — T451X5A Adverse effect of antineoplastic and immunosuppressive drugs, initial encounter: Secondary | ICD-10-CM

## 2019-09-10 DIAGNOSIS — R768 Other specified abnormal immunological findings in serum: Secondary | ICD-10-CM

## 2019-09-10 MED ORDER — HYDROXYCHLOROQUINE SULFATE 200 MG PO TABS: ORAL_TABLET | ORAL | 0 refills | Status: DC

## 2019-09-11 LAB — HEPATIC FUNCTION PANEL
AG Ratio: 1.3 (calc) (ref 1.0–2.5)
ALT: 21 U/L (ref 6–29)
AST: 28 U/L (ref 10–35)
Albumin: 4.4 g/dL (ref 3.6–5.1)
Alkaline phosphatase (APISO): 61 U/L (ref 37–153)
Bilirubin, Direct: 0.1 mg/dL (ref 0.0–0.2)
Globulin: 3.4 g/dL (calc) (ref 1.9–3.7)
Indirect Bilirubin: 0.5 mg/dL (calc) (ref 0.2–1.2)
Total Bilirubin: 0.6 mg/dL (ref 0.2–1.2)
Total Protein: 7.8 g/dL (ref 6.1–8.1)

## 2019-09-11 LAB — CBC WITH DIFFERENTIAL/PLATELET
Absolute Monocytes: 173 cells/uL — ABNORMAL LOW (ref 200–950)
Basophils Absolute: 19 cells/uL (ref 0–200)
Basophils Relative: 0.6 %
Eosinophils Absolute: 32 cells/uL (ref 15–500)
Eosinophils Relative: 1 %
HCT: 37.7 % (ref 35.0–45.0)
Hemoglobin: 12.7 g/dL (ref 11.7–15.5)
Lymphs Abs: 1606 cells/uL (ref 850–3900)
MCH: 29.6 pg (ref 27.0–33.0)
MCHC: 33.7 g/dL (ref 32.0–36.0)
MCV: 87.9 fL (ref 80.0–100.0)
MPV: 10.5 fL (ref 7.5–12.5)
Monocytes Relative: 5.4 %
Neutro Abs: 1370 cells/uL — ABNORMAL LOW (ref 1500–7800)
Neutrophils Relative %: 42.8 %
Platelets: 256 10*3/uL (ref 140–400)
RBC: 4.29 10*6/uL (ref 3.80–5.10)
RDW: 13.3 % (ref 11.0–15.0)
Total Lymphocyte: 50.2 %
WBC: 3.2 10*3/uL — ABNORMAL LOW (ref 3.8–10.8)

## 2019-09-11 LAB — C3 AND C4
C3 Complement: 145 mg/dL (ref 83–193)
C4 Complement: 24 mg/dL (ref 15–57)

## 2019-09-11 LAB — URINALYSIS, ROUTINE W REFLEX MICROSCOPIC
Bacteria, UA: NONE SEEN /HPF
Bilirubin Urine: NEGATIVE
Glucose, UA: NEGATIVE
Hyaline Cast: NONE SEEN /LPF
Ketones, ur: NEGATIVE
Nitrite: NEGATIVE
Protein, ur: NEGATIVE
RBC / HPF: NONE SEEN /HPF (ref 0–2)
Specific Gravity, Urine: 1.015 (ref 1.001–1.03)
pH: 6.5 (ref 5.0–8.0)

## 2019-09-11 LAB — SEDIMENTATION RATE: Sed Rate: 22 mm/h (ref 0–30)

## 2019-09-11 LAB — ANTI-DNA ANTIBODY, DOUBLE-STRANDED: ds DNA Ab: 4 [IU]/mL

## 2019-09-14 ENCOUNTER — Other Ambulatory Visit: Payer: Self-pay | Admitting: Hematology and Oncology

## 2019-09-14 DIAGNOSIS — Z1231 Encounter for screening mammogram for malignant neoplasm of breast: Secondary | ICD-10-CM

## 2019-09-15 ENCOUNTER — Encounter: Payer: Self-pay | Admitting: Physician Assistant

## 2019-09-15 DIAGNOSIS — Z79899 Other long term (current) drug therapy: Secondary | ICD-10-CM | POA: Diagnosis not present

## 2019-09-15 DIAGNOSIS — H25013 Cortical age-related cataract, bilateral: Secondary | ICD-10-CM | POA: Diagnosis not present

## 2019-09-15 DIAGNOSIS — M3210 Systemic lupus erythematosus, organ or system involvement unspecified: Secondary | ICD-10-CM | POA: Diagnosis not present

## 2019-09-15 DIAGNOSIS — H04123 Dry eye syndrome of bilateral lacrimal glands: Secondary | ICD-10-CM | POA: Diagnosis not present

## 2019-09-15 LAB — HM DIABETES EYE EXAM

## 2019-09-16 ENCOUNTER — Other Ambulatory Visit: Payer: Self-pay | Admitting: Obstetrics and Gynecology

## 2019-09-16 DIAGNOSIS — E2839 Other primary ovarian failure: Secondary | ICD-10-CM

## 2019-09-29 ENCOUNTER — Other Ambulatory Visit: Payer: Self-pay | Admitting: Obstetrics and Gynecology

## 2019-09-29 DIAGNOSIS — Z1151 Encounter for screening for human papillomavirus (HPV): Secondary | ICD-10-CM | POA: Diagnosis not present

## 2019-09-29 DIAGNOSIS — N6320 Unspecified lump in the left breast, unspecified quadrant: Secondary | ICD-10-CM

## 2019-09-29 DIAGNOSIS — Z01419 Encounter for gynecological examination (general) (routine) without abnormal findings: Secondary | ICD-10-CM | POA: Diagnosis not present

## 2019-09-29 DIAGNOSIS — Z01411 Encounter for gynecological examination (general) (routine) with abnormal findings: Secondary | ICD-10-CM | POA: Diagnosis not present

## 2019-10-09 ENCOUNTER — Ambulatory Visit
Admission: RE | Admit: 2019-10-09 | Discharge: 2019-10-09 | Disposition: A | Discharge status: Home / Self Care | Payer: BC Managed Care – PPO | Source: Ambulatory Visit | Attending: Obstetrics and Gynecology | Admitting: Obstetrics and Gynecology

## 2019-10-09 ENCOUNTER — Other Ambulatory Visit: Payer: Self-pay

## 2019-10-09 DIAGNOSIS — R928 Other abnormal and inconclusive findings on diagnostic imaging of breast: Secondary | ICD-10-CM | POA: Diagnosis not present

## 2019-10-09 DIAGNOSIS — N6320 Unspecified lump in the left breast, unspecified quadrant: Secondary | ICD-10-CM

## 2019-10-09 DIAGNOSIS — N6489 Other specified disorders of breast: Secondary | ICD-10-CM | POA: Diagnosis not present

## 2019-10-15 DIAGNOSIS — F411 Generalized anxiety disorder: Secondary | ICD-10-CM | POA: Diagnosis not present

## 2019-10-19 ENCOUNTER — Ambulatory Visit: Payer: BC Managed Care – PPO

## 2019-11-03 ENCOUNTER — Other Ambulatory Visit: Payer: Self-pay | Admitting: Family Medicine

## 2019-11-03 ENCOUNTER — Other Ambulatory Visit: Payer: Self-pay | Admitting: Neurology

## 2019-11-03 DIAGNOSIS — C50511 Malignant neoplasm of lower-outer quadrant of right female breast: Secondary | ICD-10-CM

## 2019-11-26 ENCOUNTER — Other Ambulatory Visit: Payer: Self-pay | Admitting: Neurology

## 2019-11-27 ENCOUNTER — Other Ambulatory Visit: Payer: Self-pay

## 2019-11-27 ENCOUNTER — Inpatient Hospital Stay: Admission: RE | Admit: 2019-11-27 | Payer: BC Managed Care – PPO | Source: Ambulatory Visit

## 2019-12-20 ENCOUNTER — Other Ambulatory Visit: Payer: Self-pay | Admitting: Physician Assistant

## 2019-12-21 NOTE — Telephone Encounter (Signed)
Last Visit: 09/10/2019 Next Visit: 02/11/2020 Labs: 09/10/2019 WBC 3.2, neutro abs 1,370, absolute monocytes 173. Hepatic function panel WNL.  Eye exam: 09/15/2019  Okay to refill per Dr. Estanislado Pandy.

## 2019-12-22 ENCOUNTER — Other Ambulatory Visit: Payer: Self-pay | Admitting: Neurology

## 2019-12-25 ENCOUNTER — Telehealth: Payer: Self-pay | Admitting: Neurology

## 2019-12-25 NOTE — Telephone Encounter (Signed)
Received call patient asking for refills for gabapentin for neuropathy. Patient was last seen in the office in 2019, called [facility], she last refilled gabapentin in April 2020. Gave verbal for gabapentin 600mg  TID #90 with no refills. Patient needs to call on Tues for follow-up for further refills.

## 2019-12-29 ENCOUNTER — Telehealth: Payer: Self-pay | Admitting: Neurology

## 2019-12-29 MED ORDER — GABAPENTIN 600 MG PO TABS: 600.0000 mg | ORAL_TABLET | Freq: Three times a day (TID) | ORAL | 6 refills | Status: DC

## 2019-12-29 NOTE — Telephone Encounter (Signed)
Pt is schedule to see Dr Delice Lesch on 07-21-20

## 2019-12-29 NOTE — Telephone Encounter (Signed)
Refills sent until 06/2020 f/u

## 2019-12-29 NOTE — Telephone Encounter (Signed)
Left message for the after hour service on 12-25-19 @ 5:56 pm    Caller states that her pharmacy told her the office keeps denying her RX medication is Gabapentin and she needs a refill.  Caller states that she has been taking Gabapentin for pain since 2015 and the pharmacy told her the PCP denies to fill it patient has been out for 3 days.   Medication is scheduled in [place] but not in [place] where pharmacy is [facility] [phone]  Gabapentin 600 ,g TID for neuropathy   Spoke with on call provider

## 2019-12-29 NOTE — Telephone Encounter (Signed)
Front staff is calling the pt to set up an appointment

## 2019-12-29 NOTE — Telephone Encounter (Signed)
Pt needs an appointment before she can get anymore refills last seen in 2019

## 2020-02-02 NOTE — Progress Notes (Signed)
Office Visit Note  Patient: Tina [name]             Date of Birth: 01-16-1961           MRN: [id number]             PCP: Martinique, Betty G, MD Referring: Martinique, Betty G, MD Visit Date: 02/11/2020 Occupation: @GUAROCC @  Subjective:  Other (wrist pain and bilateral ankle pain )   History of Present Illness: Tina [name] is a 59 y.o. female with history of autoimmune disease, osteoarthritis and degenerative disc disease.  She states she is doing quite well as regards to autoimmune disease.  She describes pain and discomfort in her wrists where she points to her bilateral CMC joints.  She has been experiencing some discomfort in her ankle joints as well.  She denies any history of oral ulcers or nasal ulcers.  She continues to have dry mouth and dry eyes.  She denies any history of Raynaud's phenomenon lymphadenopathy.  She has noted some white spots but no rash.  Activities of Daily Living:  Patient reports morning stiffness for 24 hours.   Patient Reports nocturnal pain.  Difficulty dressing/grooming: Denies Difficulty climbing stairs: Denies Difficulty getting out of chair: Denies Difficulty using hands for taps, buttons, cutlery, and/or writing: Denies  Review of Systems  Constitutional: Positive for fatigue. Negative for night sweats, weight gain and weight loss.  HENT: Positive for mouth dryness and nose dryness. Negative for mouth sores, trouble swallowing and trouble swallowing.   Eyes: Positive for dryness. Negative for pain, redness, itching and visual disturbance.  Respiratory: Negative for cough, shortness of breath and difficulty breathing.   Cardiovascular: Negative for chest pain, palpitations, hypertension, irregular heartbeat and swelling in legs/feet.  Gastrointestinal: Negative for blood in stool, constipation and diarrhea.  Endocrine: Negative for increased urination.  Genitourinary: Negative for difficulty urinating and vaginal dryness.  Musculoskeletal:  Positive for arthralgias, joint pain, joint swelling, myalgias, morning stiffness, muscle tenderness and myalgias. Negative for muscle weakness.  Skin: Negative for color change, rash, hair loss, redness, skin tightness, ulcers and sensitivity to sunlight.  Allergic/Immunologic: Negative for susceptible to infections.  Neurological: Positive for numbness. Negative for dizziness, headaches, memory loss, night sweats and weakness.  Hematological: Negative for bruising/bleeding tendency and swollen glands.  Psychiatric/Behavioral: Negative for depressed mood, confusion and sleep disturbance. The patient is not nervous/anxious.     PMFS History:  Patient Active Problem List   Diagnosis Date Noted  . Controlled type 2 diabetes mellitus with complication, without long-term current use of insulin ([place]) 05/07/2019  . Hyperlipidemia 05/05/2019  . Obesity (BMI 30-39.9) 05/05/2019  . Fibromyalgia 02/28/2016  . Chronic low back pain 01/24/2016  . Lupus ([place]) 04/19/2015  . GERD (gastroesophageal reflux disease) 04/19/2015  . Cervicogenic headache 02/10/2015  . Neck pain 02/10/2015  . Hot flashes 10/26/2014  . GAD (generalized anxiety disorder) 10/26/2014  . Peripheral neuropathy due to chemotherapy ([place]) 03/12/2014  . Breast cancer of lower-outer quadrant of right female breast ([place]) 09/15/2013  . Left shoulder pain - seeing Dr. Lorin Mercy 08/20/2013  . Hypertension     Past Medical History:  Diagnosis Date  . Anxiety   . Breast cancer ([place]) 2015   Right Breast Cancer  . Cancer of right breast ([place]) 09/03/13   Invasive Ductal Carcinoma/Ductal Carcinoma Insitu  biopsies  . Chronic low back pain 01/24/2016   -and R hip pain -seen by rheumatologist and ortho in the past -MRI with Dr. Lorin Mercy in 2015,  R hip plain films in 2015 as well acupuncture, injections, tramadol not helpful trigger [facility] and PT helpful   . Degenerative joint disease    back neck and shoulder  . Fibromyalgia      Dr. Estanislado Pandy  . GERD (gastroesophageal reflux disease)    hx esophagitis  . Hot flashes   . Hypertension   . Leukopenia 06/16/2013  . Lupus [facility])    rheumatologist - Dr. Estanislado Pandy  . Migraines   . Neuromuscular disorder ([place])    neuropathy from chemo in feet/hands  . Personal history of chemotherapy    2015  . Personal history of radiation therapy 2015   Right Breast Cancer  . S/P radiation therapy 04/27/2014-06/07/2014   1) Right breast, 50 Gy in 25 fractions/ 2) Right breast boost, 10 Gy in 5 fractions    Family History  Problem Relation Age of Onset  . Hypertension Mother   . Dementia Mother        small vessel disease  . Cancer Mother 30       cervical  cancer  . Cancer Father 37       prostate ca  . Diabetes Father   . Arthritis Father   . Cancer Half-Sister   . Breast cancer Neg Hx    Past Surgical History:  Procedure Laterality Date  . BREAST BIOPSY Left 2015   benign core  . BREAST BIOPSY Right 2015   malignant core   . BREAST LUMPECTOMY Right 2015  . BREAST SURGERY  03/01/2014   hx right breast cancer- 3 lymph nodes removed and lumpectomy  . [place]   rt  . CESAREAN SECTION     x 3  . PORTACATH PLACEMENT  08/2013   still currently active   . ROBOTIC ASSISTED BILATERAL SALPINGO OOPHERECTOMY Bilateral 07/09/2014   Procedure: ROBOTIC ASSISTED BILATERAL SALPINGO OOPHORECTOM; :LYSIS OF ADHESIONS;  Surgeon: Marvene Staff, MD;  Location: [facility];  Service: Gynecology;  Laterality: Bilateral;  . TRIGGER FINGER RELEASE  2004   rt thumb  . trigger finger release left thumb  06/28/14   at [facility]  . TUBAL LIGATION    . WISDOM TOOTH EXTRACTION     Social History   Social History Narrative   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      Home Situation: lives with husband and 60 yo son      Spiritual Beliefs: Christian      Lifestyle: elliptical 3-4 times per week; working on diet as well over last year in 2014             Immunization History  Administered Date(s) Administered  . Influenza Split 06/16/2012  . Influenza,inj,Quad PF,6+ Mos 06/17/2013, 04/23/2014, 04/25/2015, 05/17/2017, 05/05/2019  . Pneumococcal Polysaccharide-23 02/16/2015     Objective: Vital Signs: BP (!) 152/99 (BP Location: Left Arm, Patient Position: Sitting, Cuff Size: Normal)   Pulse 89   Resp 16   Ht 5\' 4"  (1.626 m)   Wt 180 lb 9.6 oz (81.9 kg)   LMP 09/28/2013 (Approximate)   BMI 31.00 kg/m    Physical Exam Vitals and nursing note reviewed.  Constitutional:      Appearance: She is well-developed.  HENT:     Head: Normocephalic and atraumatic.  Eyes:     Conjunctiva/sclera: Conjunctivae normal.  Cardiovascular:     Rate and Rhythm: Normal rate and regular rhythm.     Heart sounds: Normal heart sounds.  Pulmonary:     Effort: Pulmonary effort is normal.     Breath sounds: Normal breath sounds.  Abdominal:     General: Bowel sounds are normal.     Palpations: Abdomen is soft.  Musculoskeletal:     Cervical back: Normal range of motion.  Lymphadenopathy:     Cervical: No cervical adenopathy.  Skin:    General: Skin is warm and dry.     Capillary Refill: Capillary refill takes less than 2 seconds.  Neurological:     Mental Status: She is alert and oriented to person, place, and time.  Psychiatric:        Behavior: Behavior normal.      Musculoskeletal Exam: C-spine thoracic and lumbar spine with good range of motion.  Shoulder joints, elbow joints, wrist joints with good range of motion.  She has bilateral CMC thickening and tenderness on the right side.  No synovitis was noted.  Hip joints, knee joints, ankles and MTPs and PIPs with good range of motion with no synovitis.  CDAI Exam: CDAI Score: -- Patient Global: --; Provider Global: -- Swollen: --; Tender: -- Joint Exam 02/11/2020   No joint exam has been documented for this visit   There is currently no information documented on the homunculus. Go  to the Rheumatology activity and complete the homunculus joint exam.  Investigation: No additional findings.  Imaging: DG BONE DENSITY (DXA)  Result Date: 02/09/2020 EXAM: DUAL X-RAY ABSORPTIOMETRY (DXA) FOR BONE MINERAL DENSITY IMPRESSION: Referring Physician:  COUSINS, [place] Your patient completed a BMD test using Lunar IDXA DXA system ( analysis version: 16 ) manufactured by [employer]. Technologist: KT PATIENT: Name: MESHAWN, OCONNOR Patient ID: [id number] Birth Date: [birth year] Height: 64.0 in. Sex: Female Measured: 02/08/2020 Weight: 180.4 lbs. Indications: Bilateral Ovariectomy (65.51), Breast Cancer History, Effexor, Estrogen Deficient, Gabapentin, History of Osteopenia, Lupus, Postmenopausal, Prilosec, Secondary Osteoporosis Fractures: Mandible Treatments: Calcium (E943.0), Vitamin D (E933.5) ASSESSMENT: The BMD measured at AP Spine L1-L4 (L3) is 0.979 g/cm2 with a T-score of -1.6. This patient is considered osteopenic according to [facility]) criteria. The scan quality is good. L-3 was excluded due to degenerative changes. Spine was not compared to prior study due to exclusion of vertebral bodies on current exam. Prior DXA exam performed on Hologic device measures only unilateral hip (not Total Mean Hip). Therefore, current Total Mean Hip cannot be compared to prior exam. Site Region Measured Date Measured Age YA BMD Significant CHANGE T-score AP Spine  L1-L4 (L3)  02[age]08/2020    57.3         -1.6    0.979 g/cm2 DualFemur Total Right 02/08/2020    57.3         -0.7    0.919 g/cm2 DualFemur Total Mean  02/08/2020    57.3         -0.2    0.979 g/cm2 World Health Organization [facility]) criteria for post-menopausal, Caucasian Women: Normal       T-score at or above -1 SD Osteopenia   T-score between -1 and -2.5 SD Osteoporosis T-score at or below -2.5 SD RECOMMENDATION: 1. All patients should optimize calcium and vitamin D intake. 2. Consider FDA approved medical therapies in  postmenopausal women and men aged 59 years and older, based on the following: a. A hip or vertebral (clinical or morphometric) fracture b. T- score < or = -2.5 at the femoral neck or spine after appropriate evaluation to exclude secondary causes c. Low bone mass (T-score between -1.0  and -2.5 at the femoral neck or spine) and a 10 year probability of a hip fracture > or = 3% or a 10 year probability of a major osteoporosis-related fracture > or = 20% based on the [place]-adapted WHO algorithm d. Clinician judgment and/or patient preferences may indicate treatment for people with 10-year fracture probabilities above or below these levels FOLLOW-UP: Patients with diagnosis of osteoporosis or at high risk for fracture should have regular bone mineral density tests. For patients eligible for Medicare, routine testing is allowed once every 2 years. The testing frequency can be increased to one year for patients who have rapidly progressing disease, those who are receiving or discontinuing medical therapy to restore bone mass, or have additional risk factors. I have reviewed this report and agree with the above findings. [facility] FRAX* 10-year Probability of Fracture Based on femoral neck BMD: DualFemur (Right) Major Osteoporotic Fracture: 2.5% Hip Fracture:                0.1% Population:                  [place] ([place]) Risk Factors:                Secondary Osteoporosis *FRAX is a [place] [employer] for Metabolic Bone Disease, a [place] (WHO) [facility]. The probability of a major osteoporotic fracture is 2.5% within the next ten years. The probability of a hip fracture is 0.1 % within the next ten years. Electronically Signed   By: Lowella Grip III M.D.   On: 02/09/2020 07:43    Recent Labs: Lab Results  Component Value Date   WBC 3.2 (L) 09/10/2019   HGB 12.7 09/10/2019   PLT 256 09/10/2019   NA 140 09/08/2019   K 3.9  09/08/2019   CL 103 09/08/2019   CO2 32 09/08/2019   GLUCOSE 100 (H) 09/08/2019   BUN 11 09/08/2019   CREATININE 1.07 09/08/2019   BILITOT 0.6 09/10/2019   ALKPHOS 54 10/11/2016   AST 28 09/10/2019   ALT 21 09/10/2019   PROT 7.8 09/10/2019   ALBUMIN 4.3 10/11/2016   CALCIUM 9.3 09/08/2019   GFRAA 73 04/27/2019    Speciality Comments: PLQ eye exam: 09/15/2019 normal. [facility]. Follow up in 1 year.  Procedures:  No procedures performed Allergies: Adhesive [tape], Other, and Nickel   Assessment / Plan:     Visit Diagnoses: Autoimmune disease ([place]) - + ANA 1:320 Speckled-patient had past Hx of neutropenia, fatigue, oral ulcers, sicca symptoms, parotitis, positive anti-CCP.  She is clinically doing well with no synovitis on examination.  She denies any recent episodes of oral ulcers.  She has sicca symptoms.  She continues to have neutropenia.  Her autoimmune labs from February 2021 were normal.  High risk medication use - Plaquenil 200 mg 1 tablet in the morning and 1/2 tablet in the evening restarted in August 2020. PLQ eye exam: 09/15/2019  - Plan: CBC with Differential/Platelet, COMPLETE METABOLIC PANEL WITH GFR today.  Cyclic citrullinated peptide (CCP) antibody positive-she had no synovitis on examination.  Fibromyalgia-she still continues to have some generalized pain.  Trapezius muscle spasm-secondary to stress.  Primary osteoarthritis of both hands-she had mostly discomfort over CMC joints.  Her right CMC joint was quite prominent.  Right CMC brace was given.  Joint protection was discussed.  Primary osteoarthritis of both hips-she is currently not having much discomfort.  Primary osteoarthritis of both  feet-proper fitting shoes were discussed.  DDD (degenerative disc disease), cervical-she had good range of motion.  Chronic right SI joint pain-resolved.  Osteopenia, unspecified location - DXA done by her GYN on 02/08/2020 showed a T score of -1.6, BMD  0.979 in the lumbar region.  Use of calcium vitamin D and resistive exercise was discussed.  Essential hypertension-her blood pressure is elevated.  She has been advised to monitor blood pressure closely.  Malignant neoplasm of lower-outer quadrant of right breast of female, estrogen receptor positive ([place])  Peripheral neuropathy due to chemotherapy [facility])  Orders: Orders Placed This Encounter  Procedures  . CBC with Differential/Platelet  . COMPLETE METABOLIC PANEL WITH GFR   No orders of the defined types were placed in this encounter.     Follow-Up Instructions: Return for Autoimmune disease, Osteoarthritis.   [name], MD  Note - This record has been created using Editor, commissioning.  Chart creation errors have been sought, but may not always  have been located. Such creation errors do not reflect on  the standard of medical care.

## 2020-02-08 ENCOUNTER — Ambulatory Visit
Admission: RE | Admit: 2020-02-08 | Discharge: 2020-02-08 | Disposition: A | Discharge status: Home / Self Care | Payer: BC Managed Care – PPO | Source: Ambulatory Visit | Attending: Obstetrics and Gynecology | Admitting: Obstetrics and Gynecology

## 2020-02-08 ENCOUNTER — Other Ambulatory Visit: Payer: Self-pay

## 2020-02-08 DIAGNOSIS — M8588 Other specified disorders of bone density and structure, other site: Secondary | ICD-10-CM | POA: Diagnosis not present

## 2020-02-08 DIAGNOSIS — Z780 Asymptomatic menopausal state: Secondary | ICD-10-CM | POA: Diagnosis not present

## 2020-02-08 DIAGNOSIS — E2839 Other primary ovarian failure: Secondary | ICD-10-CM

## 2020-02-09 ENCOUNTER — Other Ambulatory Visit: Payer: Self-pay | Admitting: Family Medicine

## 2020-02-09 DIAGNOSIS — C50511 Malignant neoplasm of lower-outer quadrant of right female breast: Secondary | ICD-10-CM

## 2020-02-11 ENCOUNTER — Other Ambulatory Visit: Payer: Self-pay

## 2020-02-11 ENCOUNTER — Ambulatory Visit (INDEPENDENT_AMBULATORY_CARE_PROVIDER_SITE_OTHER): Payer: BC Managed Care – PPO | Admitting: Rheumatology

## 2020-02-11 ENCOUNTER — Encounter: Payer: Self-pay | Admitting: Rheumatology

## 2020-02-11 VITALS — BP 152/99 | HR 89 | Resp 16 | Ht 64.0 in | Wt 180.6 lb

## 2020-02-11 DIAGNOSIS — G8929 Other chronic pain: Secondary | ICD-10-CM

## 2020-02-11 DIAGNOSIS — M19042 Primary osteoarthritis, left hand: Secondary | ICD-10-CM

## 2020-02-11 DIAGNOSIS — Z79899 Other long term (current) drug therapy: Secondary | ICD-10-CM | POA: Diagnosis not present

## 2020-02-11 DIAGNOSIS — M359 Systemic involvement of connective tissue, unspecified: Secondary | ICD-10-CM | POA: Diagnosis not present

## 2020-02-11 DIAGNOSIS — R7989 Other specified abnormal findings of blood chemistry: Secondary | ICD-10-CM | POA: Diagnosis not present

## 2020-02-11 DIAGNOSIS — M19071 Primary osteoarthritis, right ankle and foot: Secondary | ICD-10-CM | POA: Diagnosis not present

## 2020-02-11 DIAGNOSIS — M62838 Other muscle spasm: Secondary | ICD-10-CM

## 2020-02-11 DIAGNOSIS — I10 Essential (primary) hypertension: Secondary | ICD-10-CM | POA: Diagnosis not present

## 2020-02-11 DIAGNOSIS — M797 Fibromyalgia: Secondary | ICD-10-CM

## 2020-02-11 DIAGNOSIS — C50511 Malignant neoplasm of lower-outer quadrant of right female breast: Secondary | ICD-10-CM

## 2020-02-11 DIAGNOSIS — M19072 Primary osteoarthritis, left ankle and foot: Secondary | ICD-10-CM

## 2020-02-11 DIAGNOSIS — M160 Bilateral primary osteoarthritis of hip: Secondary | ICD-10-CM | POA: Diagnosis not present

## 2020-02-11 DIAGNOSIS — Z170 Estrogen receptor positive status [ER+]: Secondary | ICD-10-CM

## 2020-02-11 DIAGNOSIS — M5030 Other cervical disc degeneration, unspecified cervical region: Secondary | ICD-10-CM

## 2020-02-11 DIAGNOSIS — G620 Drug-induced polyneuropathy: Secondary | ICD-10-CM

## 2020-02-11 DIAGNOSIS — M8580 Other specified disorders of bone density and structure, unspecified site: Secondary | ICD-10-CM

## 2020-02-11 DIAGNOSIS — M533 Sacrococcygeal disorders, not elsewhere classified: Secondary | ICD-10-CM | POA: Diagnosis not present

## 2020-02-11 DIAGNOSIS — R768 Other specified abnormal immunological findings in serum: Secondary | ICD-10-CM

## 2020-02-11 DIAGNOSIS — M19041 Primary osteoarthritis, right hand: Secondary | ICD-10-CM

## 2020-02-11 DIAGNOSIS — T451X5A Adverse effect of antineoplastic and immunosuppressive drugs, initial encounter: Secondary | ICD-10-CM

## 2020-02-12 DIAGNOSIS — H04123 Dry eye syndrome of bilateral lacrimal glands: Secondary | ICD-10-CM | POA: Diagnosis not present

## 2020-02-12 DIAGNOSIS — H1089 Other conjunctivitis: Secondary | ICD-10-CM | POA: Diagnosis not present

## 2020-02-12 LAB — CBC WITH DIFFERENTIAL/PLATELET
Absolute Monocytes: 229 cells/uL (ref 200–950)
Basophils Absolute: 29 cells/uL (ref 0–200)
Basophils Relative: 1 %
Eosinophils Absolute: 29 cells/uL (ref 15–500)
Eosinophils Relative: 1 %
HCT: 38.2 % (ref 35.0–45.0)
Hemoglobin: 12.6 g/dL (ref 11.7–15.5)
Lymphs Abs: 1441 cells/uL (ref 850–3900)
MCH: 29.9 pg (ref 27.0–33.0)
MCHC: 33 g/dL (ref 32.0–36.0)
MCV: 90.5 fL (ref 80.0–100.0)
MPV: 10.5 fL (ref 7.5–12.5)
Monocytes Relative: 7.9 %
Neutro Abs: 1172 cells/uL — ABNORMAL LOW (ref 1500–7800)
Neutrophils Relative %: 40.4 %
Platelets: 217 10*3/uL (ref 140–400)
RBC: 4.22 10*6/uL (ref 3.80–5.10)
RDW: 13.6 % (ref 11.0–15.0)
Total Lymphocyte: 49.7 %
WBC: 2.9 10*3/uL — ABNORMAL LOW (ref 3.8–10.8)

## 2020-02-12 LAB — COMPLETE METABOLIC PANEL WITH GFR
AG Ratio: 1.3 (calc) (ref 1.0–2.5)
ALT: 20 U/L (ref 6–29)
AST: 26 U/L (ref 10–35)
Albumin: 4.3 g/dL (ref 3.6–5.1)
Alkaline phosphatase (APISO): 67 U/L (ref 37–153)
BUN: 13 mg/dL (ref 7–25)
CO2: 30 mmol/L (ref 20–32)
Calcium: 9.8 mg/dL (ref 8.6–10.4)
Chloride: 103 mmol/L (ref 98–110)
Creat: 1.01 mg/dL (ref 0.50–1.05)
GFR, Est African American: 72 mL/min/{1.73_m2} (ref >=60)
GFR, Est Non African American: 62 mL/min/{1.73_m2} (ref >=60)
Globulin: 3.2 g/dL (calc) (ref 1.9–3.7)
Glucose, Bld: 125 mg/dL — ABNORMAL HIGH (ref 65–99)
Potassium: 3.5 mmol/L (ref 3.5–5.3)
Sodium: 140 mmol/L (ref 135–146)
Total Bilirubin: 0.7 mg/dL (ref 0.2–1.2)
Total Protein: 7.5 g/dL (ref 6.1–8.1)

## 2020-02-12 NOTE — Progress Notes (Signed)
WBC count is low.  Patient was clinically doing well.  Please advise patient to decrease Plaquenil to 1 tablet a day.  Repeat CBC in 1 month.

## 2020-02-15 ENCOUNTER — Other Ambulatory Visit: Payer: Self-pay | Admitting: *Deleted

## 2020-02-15 DIAGNOSIS — Z79899 Other long term (current) drug therapy: Secondary | ICD-10-CM

## 2020-02-15 NOTE — Telephone Encounter (Signed)
-----   Message from [name], MD sent at 02/12/2020  8:08 AM EDT ----- WBC count is low.  Patient was clinically doing well.  Please advise patient to decrease Plaquenil to 1 tablet a day.  Repeat CBC in 1 month.

## 2020-02-16 MED ORDER — HYDROXYCHLOROQUINE SULFATE 200 MG PO TABS
200.0000 mg | ORAL_TABLET | Freq: Every day | ORAL | 0 refills | Status: DC
Start: 2020-02-16 — End: 2020-06-29

## 2020-03-03 DIAGNOSIS — H1089 Other conjunctivitis: Secondary | ICD-10-CM | POA: Diagnosis not present

## 2020-03-03 DIAGNOSIS — H04123 Dry eye syndrome of bilateral lacrimal glands: Secondary | ICD-10-CM | POA: Diagnosis not present

## 2020-04-29 ENCOUNTER — Telehealth: Payer: Self-pay | Admitting: *Deleted

## 2020-04-29 NOTE — Telephone Encounter (Signed)
Received VM from pt.  Attempt x1 to return call.  No answer, LVM to return call to the office.  

## 2020-05-10 ENCOUNTER — Other Ambulatory Visit: Payer: Self-pay | Admitting: Family Medicine

## 2020-05-10 DIAGNOSIS — I10 Essential (primary) hypertension: Secondary | ICD-10-CM

## 2020-06-29 ENCOUNTER — Other Ambulatory Visit: Payer: Self-pay | Admitting: Rheumatology

## 2020-06-29 ENCOUNTER — Other Ambulatory Visit: Payer: Self-pay | Admitting: Family Medicine

## 2020-06-29 DIAGNOSIS — C50511 Malignant neoplasm of lower-outer quadrant of right female breast: Secondary | ICD-10-CM

## 2020-06-29 NOTE — Telephone Encounter (Signed)
Last Visit: 02/11/2020 Next Visit: 07/14/2020 Labs: 02/11/2020 WBC count is low Eye exam: 09/15/2019 normal  Current Dose per lab note 02/11/2020: patient to decrease Plaquenil to 1 tablet a day. DX: Autoimmune disease   Okay to refill Plaquenil?

## 2020-06-30 NOTE — Progress Notes (Signed)
Office Visit Note  Patient: Tina [name]             Date of Birth: 1961/04/06           MRN: [id number]             PCP: Martinique, Betty G, MD Referring: Martinique, Betty G, MD Visit Date: 07/14/2020 Occupation: @GUAROCC @  Subjective:  Right hip pain.   History of Present Illness: Tina [name] is a 59 y.o. female with history of autoimmune disease, osteoarthritis and fibromyalgia.  She denies any history of oral ulcers, nasal ulcers, malar rash, photosensitivity or sicca symptoms currently.  She states dry mouth and dry eye symptoms are mild.  She has been experiencing increased pain in her right hip joint.  None of the other joints are as painful.  She has some underlying discomfort due to osteoarthritis in her hands and her feet.  Her fibromyalgia is not very active currently.  She has some discomfort in her cervical region.  She continues to have some lower back discomfort.  Activities of Daily Living:  Patient reports morning stiffness for 24 hours.   Patient Reports nocturnal pain.  Difficulty dressing/grooming: Denies Difficulty climbing stairs: Reports Difficulty getting out of chair: Reports Difficulty using hands for taps, buttons, cutlery, and/or writing: Denies  Review of Systems  Constitutional: Positive for fatigue.  HENT: Positive for mouth dryness and nose dryness. Negative for mouth sores.   Eyes: Positive for dryness. Negative for pain, itching and visual disturbance.  Respiratory: Negative for cough, hemoptysis, shortness of breath and difficulty breathing.   Cardiovascular: Negative for chest pain, palpitations and swelling in legs/feet.  Gastrointestinal: Negative for abdominal pain, blood in stool, constipation and diarrhea.  Endocrine: Negative for increased urination.  Genitourinary: Negative for painful urination.  Musculoskeletal: Positive for arthralgias, joint pain, myalgias, morning stiffness and myalgias. Negative for joint swelling, muscle weakness and  muscle tenderness.  Skin: Negative for color change, rash and redness.  Allergic/Immunologic: Negative for susceptible to infections.  Neurological: Positive for numbness. Negative for dizziness, headaches, memory loss and weakness.  Hematological: Negative for swollen glands.  Psychiatric/Behavioral: Positive for sleep disturbance. Negative for confusion.    PMFS History:  Patient Active Problem List   Diagnosis Date Noted  . Controlled type 2 diabetes mellitus with complication, without long-term current use of insulin ([place]) 05/07/2019  . Hyperlipidemia 05/05/2019  . Obesity (BMI 30-39.9) 05/05/2019  . Fibromyalgia 02/28/2016  . Chronic low back pain 01/24/2016  . Lupus ([place]) 04/19/2015  . GERD (gastroesophageal reflux disease) 04/19/2015  . Cervicogenic headache 02/10/2015  . Neck pain 02/10/2015  . Hot flashes 10/26/2014  . GAD (generalized anxiety disorder) 10/26/2014  . Peripheral neuropathy due to chemotherapy ([place]) 03/12/2014  . Breast cancer of lower-outer quadrant of right female breast ([place]) 09/15/2013  . Left shoulder pain - seeing Dr. Lorin Mercy 08/20/2013  . Hypertension     Past Medical History:  Diagnosis Date  . Anxiety   . Breast cancer ([place]) 2015   Right Breast Cancer  . Cancer of right breast ([place]) 09/03/13   Invasive Ductal Carcinoma/Ductal Carcinoma Insitu  biopsies  . Chronic low back pain 01/24/2016   -and R hip pain -seen by rheumatologist and ortho in the past -MRI with Dr. Lorin Mercy in 2015, R hip plain films in 2015 as well acupuncture, injections, tramadol not helpful trigger therapy [facility] and PT helpful   . Degenerative joint disease    back neck and shoulder  . Fibromyalgia  Dr. Estanislado Pandy  . GERD (gastroesophageal reflux disease)    hx esophagitis  . Hot flashes   . Hypertension   . Leukopenia 06/16/2013  . Lupus [facility])    rheumatologist - Dr. Estanislado Pandy  . Migraines   . Neuromuscular disorder ([place])    neuropathy from chemo in  feet/hands  . Personal history of chemotherapy    2015  . Personal history of radiation therapy 2015   Right Breast Cancer  . S/P radiation therapy 04/27/2014-06/07/2014   1) Right breast, 50 Gy in 25 fractions/ 2) Right breast boost, 10 Gy in 5 fractions    Family History  Problem Relation Age of Onset  . Hypertension Mother   . Dementia Mother        small vessel disease  . Cancer Mother 30       cervical  cancer  . Cancer Father 43       prostate ca  . Diabetes Father   . Arthritis Father   . Cancer Half-Sister   . Breast cancer Neg Hx    Past Surgical History:  Procedure Laterality Date  . BREAST BIOPSY Left 2015   benign core  . BREAST BIOPSY Right 2015   malignant core   . BREAST LUMPECTOMY Right 2015  . BREAST SURGERY  03/01/2014   hx right breast cancer- 3 lymph nodes removed and lumpectomy  . [place]   rt  . CESAREAN SECTION     x 3  . PORTACATH PLACEMENT  08/2013   still currently active   . ROBOTIC ASSISTED BILATERAL SALPINGO OOPHERECTOMY Bilateral 07/09/2014   Procedure: ROBOTIC ASSISTED BILATERAL SALPINGO OOPHORECTOM; :LYSIS OF ADHESIONS;  Surgeon: Marvene Staff, MD;  Location: [facility];  Service: Gynecology;  Laterality: Bilateral;  . TRIGGER FINGER RELEASE  2004   rt thumb  . trigger finger release left thumb  06/28/14   at [facility] center  . TUBAL LIGATION    . WISDOM TOOTH EXTRACTION     Social History   Social History Narrative   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      Home Situation: lives with husband and 27 yo son      Spiritual Beliefs: Christian      Lifestyle: elliptical 3-4 times per week; working on diet as well over last year in 2014            Immunization History  Administered Date(s) Administered  . Influenza Split 06/16/2012  . Influenza,inj,Quad PF,6+ Mos 06/17/2013, 04/23/2014, 04/25/2015, 05/17/2017, 05/05/2019  . PFIZER SARS-COV-2 Vaccination 10/09/2019, 11/02/2019  . Pneumococcal  Polysaccharide-23 02/16/2015     Objective: Vital Signs: BP (!) 154/94 (BP Location: Left Arm, Patient Position: Sitting, Cuff Size: Normal)   Pulse 90   Ht 5\' 4"  (1.626 m)   Wt 184 lb 9.6 oz (83.7 kg)   LMP 09/28/2013 (Approximate)   BMI 31.69 kg/m    Physical Exam Vitals and nursing note reviewed.  Constitutional:      Appearance: She is well-developed and well-nourished.  HENT:     Head: Normocephalic and atraumatic.  Eyes:     Extraocular Movements: EOM normal.     Conjunctiva/sclera: Conjunctivae normal.  Cardiovascular:     Rate and Rhythm: Normal rate and regular rhythm.     Pulses: Intact distal pulses.     Heart sounds: Normal heart sounds.  Pulmonary:     Effort: Pulmonary effort is normal.     Breath sounds: Normal breath sounds.  Abdominal:     General: Bowel sounds are normal.     Palpations: Abdomen is soft.  Musculoskeletal:     Cervical back: Normal range of motion.  Lymphadenopathy:     Cervical: No cervical adenopathy.  Skin:    General: Skin is warm and dry.     Capillary Refill: Capillary refill takes less than 2 seconds.  Neurological:     Mental Status: She is alert and oriented to person, place, and time.  Psychiatric:        Mood and Affect: Mood and affect normal.        Behavior: Behavior normal.      Musculoskeletal Exam: C-spine was in good range of motion.  Shoulder joints, elbow joints, wrist joints, MCPs PIPs and DIPs with good range of motion with no synovitis.  Hip joints with good range of motion.  She had discomfort range of motion of her right hip joint.  She also had tenderness on right trochanteric bursa.  Knee joints, ankles with good range of motion.  She had no tenderness or synovitis in her knees or ankles.  No tenderness across MTPs was noted.  CDAI Exam: CDAI Score: -- Patient Global: --; Provider Global: -- Swollen: --; Tender: -- Joint Exam 07/14/2020   No joint exam has been documented for this visit   There is  currently no information documented on the homunculus. Go to the Rheumatology activity and complete the homunculus joint exam.  Investigation: No additional findings.  Imaging: No results found.  Recent Labs: Lab Results  Component Value Date   WBC 2.9 (L) 02/11/2020   HGB 12.6 02/11/2020   PLT 217 02/11/2020   NA 140 02/11/2020   K 3.5 02/11/2020   CL 103 02/11/2020   CO2 30 02/11/2020   GLUCOSE 125 (H) 02/11/2020   BUN 13 02/11/2020   CREATININE 1.01 02/11/2020   BILITOT 0.7 02/11/2020   ALKPHOS 54 10/11/2016   AST 26 02/11/2020   ALT 20 02/11/2020   PROT 7.5 02/11/2020   ALBUMIN 4.3 10/11/2016   CALCIUM 9.8 02/11/2020   GFRAA 72 02/11/2020    Speciality Comments: PLQ eye exam: 09/15/2019 normal. [facility]. Follow up in 1 year.  Procedures:  Large Joint Inj: R greater trochanter on 07/14/2020 2:02 PM Indications: pain Details: 27 G 1.5 in needle, lateral approach  Arthrogram: No  Medications: 40 mg triamcinolone acetonide 40 MG/ML; 1.5 mL lidocaine 1 % Aspirate: 0 mL Outcome: tolerated well, no immediate complications Procedure, treatment alternatives, risks and benefits explained, specific risks discussed. Consent was given by the patient. Immediately prior to procedure a time out was called to verify the correct patient, procedure, equipment, support staff and site/side marked as required. Patient was prepped and draped in the usual sterile fashion.     Allergies: Adhesive [tape], Other, and Nickel   Assessment / Plan:     Visit Diagnoses: Autoimmune disease ([place]) -  + ANA 1:320 Speckled-patient had past Hx of neutropenia, fatigue, oral ulcers, sicca symptoms, parotitis, positive anti-CCP.  She had no synovitis on examination.  She continues to have neutropenia and sicca symptoms.- Plan: Urinalysis, Routine w reflex microscopic, Anti-DNA antibody, double-stranded, C3 and C4, Sedimentation rate  High risk medication use - Plaquenil 200 mg 1  tablet by mouth daily. PLQ eye exam: 09/15/2019  - Plan: CBC with Differential/Platelet, COMPLETE METABOLIC PANEL WITH GFR today.  Cyclic citrullinated peptide (CCP) antibody positive-she had no synovitis on examination.  Pain in right hip -she  has been experiencing pain and discomfort in her right hip.  She had painful range of motion.  She also tenderness over right trochanteric bursa.  Plan: XR HIP UNILAT W OR W/O PELVIS 2-3 VIEWS RIGHT.  X-ray showed only mild osteoarthritic changes.  Believe most of the discomfort is coming from trochanteric bursa.  After informed consent was obtained right trochanteric bursa was injected with cortisone as described above.  She tolerated the procedure well.  Have given her a handout on stretching exercises.  Primary osteoarthritis of both hands-no synovitis was noted.  Primary osteoarthritis of both hips-she gives history of osteoarthritis of the bilateral hips.  Primary osteoarthritis of both feet-she had no tenderness on examination.  DDD (degenerative disc disease), cervical-chronic pain.  She has good range of motion of her cervical spine.  Trapezius muscle spasm-she had bilateral trapezius spasm.  Chronic right SI joint pain-she had no tenderness on examination today.  Fibromyalgia-she continues to have generalized pain and positive tender points.  Osteopenia, unspecified location - DXA done by her GYN on 02/08/2020 showed a T score of -1.6, BMD 0.979 in the lumbar region  Malignant neoplasm of lower-outer quadrant of right breast of female, estrogen receptor positive ([place]) - 2015  Essential hypertension-blood pressure is elevated today.  Have advised her to monitor blood pressure closely.  Peripheral neuropathy due to chemotherapy [facility])    Orders: Orders Placed This Encounter  Procedures  . XR HIP UNILAT W OR W/O PELVIS 2-3 VIEWS RIGHT  . CBC with Differential/Platelet  . COMPLETE METABOLIC PANEL WITH GFR  . Urinalysis, Routine w reflex  microscopic  . Anti-DNA antibody, double-stranded  . C3 and C4  . Sedimentation rate   No orders of the defined types were placed in this encounter.     Follow-Up Instructions: Return in about 5 months (around 12/12/2020) for Autoimmune disease, Osteoarthritis.   [name], MD  Note - This record has been created using Editor, commissioning.  Chart creation errors have been sought, but may not always  have been located. Such creation errors do not reflect on  the standard of medical care.

## 2020-07-14 ENCOUNTER — Ambulatory Visit: Payer: Self-pay

## 2020-07-14 ENCOUNTER — Other Ambulatory Visit: Payer: Self-pay

## 2020-07-14 ENCOUNTER — Encounter: Payer: Self-pay | Admitting: Rheumatology

## 2020-07-14 ENCOUNTER — Ambulatory Visit (INDEPENDENT_AMBULATORY_CARE_PROVIDER_SITE_OTHER): Payer: BC Managed Care – PPO | Admitting: Rheumatology

## 2020-07-14 VITALS — BP 154/94 | HR 90 | Ht 64.0 in | Wt 184.6 lb

## 2020-07-14 DIAGNOSIS — T451X5A Adverse effect of antineoplastic and immunosuppressive drugs, initial encounter: Secondary | ICD-10-CM

## 2020-07-14 DIAGNOSIS — M5030 Other cervical disc degeneration, unspecified cervical region: Secondary | ICD-10-CM

## 2020-07-14 DIAGNOSIS — M25551 Pain in right hip: Secondary | ICD-10-CM

## 2020-07-14 DIAGNOSIS — M359 Systemic involvement of connective tissue, unspecified: Secondary | ICD-10-CM

## 2020-07-14 DIAGNOSIS — Z79899 Other long term (current) drug therapy: Secondary | ICD-10-CM | POA: Diagnosis not present

## 2020-07-14 DIAGNOSIS — M19071 Primary osteoarthritis, right ankle and foot: Secondary | ICD-10-CM | POA: Diagnosis not present

## 2020-07-14 DIAGNOSIS — R7989 Other specified abnormal findings of blood chemistry: Secondary | ICD-10-CM | POA: Diagnosis not present

## 2020-07-14 DIAGNOSIS — M797 Fibromyalgia: Secondary | ICD-10-CM | POA: Diagnosis not present

## 2020-07-14 DIAGNOSIS — M533 Sacrococcygeal disorders, not elsewhere classified: Secondary | ICD-10-CM | POA: Diagnosis not present

## 2020-07-14 DIAGNOSIS — M8580 Other specified disorders of bone density and structure, unspecified site: Secondary | ICD-10-CM | POA: Diagnosis not present

## 2020-07-14 DIAGNOSIS — M160 Bilateral primary osteoarthritis of hip: Secondary | ICD-10-CM | POA: Diagnosis not present

## 2020-07-14 DIAGNOSIS — M19042 Primary osteoarthritis, left hand: Secondary | ICD-10-CM

## 2020-07-14 DIAGNOSIS — M62838 Other muscle spasm: Secondary | ICD-10-CM | POA: Diagnosis not present

## 2020-07-14 DIAGNOSIS — G8929 Other chronic pain: Secondary | ICD-10-CM

## 2020-07-14 DIAGNOSIS — M19072 Primary osteoarthritis, left ankle and foot: Secondary | ICD-10-CM

## 2020-07-14 DIAGNOSIS — R768 Other specified abnormal immunological findings in serum: Secondary | ICD-10-CM

## 2020-07-14 DIAGNOSIS — M19041 Primary osteoarthritis, right hand: Secondary | ICD-10-CM | POA: Diagnosis not present

## 2020-07-14 DIAGNOSIS — Z170 Estrogen receptor positive status [ER+]: Secondary | ICD-10-CM

## 2020-07-14 DIAGNOSIS — G620 Drug-induced polyneuropathy: Secondary | ICD-10-CM

## 2020-07-14 DIAGNOSIS — C50511 Malignant neoplasm of lower-outer quadrant of right female breast: Secondary | ICD-10-CM

## 2020-07-14 DIAGNOSIS — I10 Essential (primary) hypertension: Secondary | ICD-10-CM

## 2020-07-14 MED ORDER — LIDOCAINE HCL 1 % IJ SOLN
1.5000 mL | INTRAMUSCULAR | Status: AC | PRN
  Administered 2020-07-14: 1.5 mL

## 2020-07-14 MED ORDER — TRIAMCINOLONE ACETONIDE 40 MG/ML IJ SUSP
40.0000 mg | INTRAMUSCULAR | Status: AC | PRN
  Administered 2020-07-14: 40 mg via INTRA_ARTICULAR

## 2020-07-14 NOTE — Patient Instructions (Addendum)
Iliotibial Band Syndrome Rehab Ask your health care provider which exercises are safe for you. Do exercises exactly as told by your health care provider and adjust them as directed. It is normal to feel mild stretching, pulling, tightness, or discomfort as you do these exercises. Stop right away if you feel sudden pain or your pain gets significantly worse. Do not begin these exercises until told by your health care provider. Stretching and range-of-motion exercises These exercises warm up your muscles and joints and improve the movement and flexibility of your hip and pelvis. Quadriceps stretch, prone  1. Lie on your abdomen on a firm surface, such as a bed or padded floor (prone position). 2. Bend your left / right knee and reach back to hold your ankle or pant leg. If you cannot reach your ankle or pant leg, loop a belt around your foot and grab the belt instead. 3. Gently pull your heel toward your buttocks. Your knee should not slide out to the side. You should feel a stretch in the front of your thigh and knee (quadriceps). 4. Hold this position for __________ seconds. Repeat __________ times. Complete this exercise __________ times a day. Iliotibial band stretch An iliotibial band is a strong band of muscle tissue that runs from the outer side of your hip to the outer side of your thigh and knee. 1. Lie on your side with your left / right leg in the top position. 2. Bend both of your knees and grab your left / right ankle. Stretch out your bottom arm to help you balance. 3. Slowly bring your top knee back so your thigh goes behind your trunk. 4. Slowly lower your top leg toward the floor until you feel a gentle stretch on the outside of your left / right hip and thigh. If you do not feel a stretch and your knee will not fall farther, place the heel of your other foot on top of your knee and pull your knee down toward the floor with your foot. 5. Hold this position for __________  seconds. Repeat __________ times. Complete this exercise __________ times a day. Strengthening exercises These exercises build strength and endurance in your hip and pelvis. Endurance is the ability to use your muscles for a long time, even after they get tired. Straight leg raises, side-lying This exercise strengthens the muscles that rotate the leg at the hip and move it away from your body (hip abductors). 1. Lie on your side with your left / right leg in the top position. Lie so your head, shoulder, hip, and knee line up. You may bend your bottom knee to help you balance. 2. Roll your hips slightly forward so your hips are stacked directly over each other and your left / right knee is facing forward. 3. Tense the muscles in your outer thigh and lift your top leg 4-6 inches (10-15 cm). 4. Hold this position for __________ seconds. 5. Slowly return to the starting position. Let your muscles relax completely before doing another repetition. Repeat __________ times. Complete this exercise __________ times a day. Leg raises, prone This exercise strengthens the muscles that move the hips (hip extensors). 1. Lie on your abdomen on your bed or a firm surface. You can put a pillow under your hips if that is more comfortable for your lower back. 2. Bend your left / right knee so your foot is straight up in the air. 3. Squeeze your buttocks muscles and lift your left / right thigh   off the bed. Do not let your back arch. 4. Tense your thigh muscle as hard as you can without increasing any knee pain. 5. Hold this position for __________ seconds. 6. Slowly lower your leg to the starting position and allow it to relax completely. Repeat __________ times. Complete this exercise __________ times a day. Hip hike 1. Stand sideways on a bottom step. Stand on your left / right leg with your other foot unsupported next to the step. You can hold on to the railing or wall for balance if needed. 2. Keep your knees  straight and your torso square. Then lift your left / right hip up toward the ceiling. 3. Slowly let your left / right hip lower toward the floor, past the starting position. Your foot should get closer to the floor. Do not lean or bend your knees. Repeat __________ times. Complete this exercise __________ times a day. This information is not intended to replace advice given to you by your health care provider. Make sure you discuss any questions you have with your health care provider. Document Revised: 11/06/2018 Document Reviewed: 05/07/2018 Elsevier Patient Education  2020 [employer].   COVID-19 vaccine recommendations:   COVID-19 vaccine is recommended for everyone (unless you are allergic to a vaccine component), even if you are on a medication that suppresses your immune system.   Do not take Tylenol or any anti-inflammatory medications (NSAIDs) 24 hours prior to the COVID-19 vaccination.   There is no direct evidence about the efficacy of the COVID-19 vaccine in individuals who are on medications that suppress the immune system.   Even if you are fully vaccinated, and you are on any medications that suppress your immune system, please continue to wear a mask, maintain at least six feet social distance and practice hand hygiene.   If you develop a COVID-19 infection, please contact your PCP or our office to determine if you need monoclonal antibody infusion.  The booster vaccine is now available for immunocompromised patients.   Please see the following web sites for updated information.   [email or web address]

## 2020-07-15 LAB — COMPLETE METABOLIC PANEL WITH GFR
AG Ratio: 1.3 (calc) (ref 1.0–2.5)
ALT: 21 U/L (ref 6–29)
AST: 25 U/L (ref 10–35)
Albumin: 4.1 g/dL (ref 3.6–5.1)
Alkaline phosphatase (APISO): 66 U/L (ref 37–153)
BUN: 15 mg/dL (ref 7–25)
CO2: 32 mmol/L (ref 20–32)
Calcium: 9.5 mg/dL (ref 8.6–10.4)
Chloride: 101 mmol/L (ref 98–110)
Creat: 0.98 mg/dL (ref 0.50–1.05)
GFR, Est African American: 74 mL/min/{1.73_m2} (ref >=60)
GFR, Est Non African American: 64 mL/min/{1.73_m2} (ref >=60)
Globulin: 3.2 g/dL (calc) (ref 1.9–3.7)
Glucose, Bld: 106 mg/dL — ABNORMAL HIGH (ref 65–99)
Potassium: 3.5 mmol/L (ref 3.5–5.3)
Sodium: 139 mmol/L (ref 135–146)
Total Bilirubin: 0.6 mg/dL (ref 0.2–1.2)
Total Protein: 7.3 g/dL (ref 6.1–8.1)

## 2020-07-15 LAB — CBC WITH DIFFERENTIAL/PLATELET
Absolute Monocytes: 258 cells/uL (ref 200–950)
Basophils Absolute: 31 cells/uL (ref 0–200)
Basophils Relative: 0.9 %
Eosinophils Absolute: 51 cells/uL (ref 15–500)
Eosinophils Relative: 1.5 %
HCT: 36.2 % (ref 35.0–45.0)
Hemoglobin: 12.5 g/dL (ref 11.7–15.5)
Lymphs Abs: 1809 cells/uL (ref 850–3900)
MCH: 31.3 pg (ref 27.0–33.0)
MCHC: 34.5 g/dL (ref 32.0–36.0)
MCV: 90.5 fL (ref 80.0–100.0)
MPV: 10 fL (ref 7.5–12.5)
Monocytes Relative: 7.6 %
Neutro Abs: 1251 cells/uL — ABNORMAL LOW (ref 1500–7800)
Neutrophils Relative %: 36.8 %
Platelets: 271 10*3/uL (ref 140–400)
RBC: 4 10*6/uL (ref 3.80–5.10)
RDW: 13.1 % (ref 11.0–15.0)
Total Lymphocyte: 53.2 %
WBC: 3.4 10*3/uL — ABNORMAL LOW (ref 3.8–10.8)

## 2020-07-15 LAB — URINALYSIS, ROUTINE W REFLEX MICROSCOPIC
Bilirubin Urine: NEGATIVE
Glucose, UA: NEGATIVE
Hgb urine dipstick: NEGATIVE
Ketones, ur: NEGATIVE
Leukocytes,Ua: NEGATIVE
Nitrite: NEGATIVE
Protein, ur: NEGATIVE
Specific Gravity, Urine: 1.015 (ref 1.001–1.03)
pH: 7 (ref 5.0–8.0)

## 2020-07-15 LAB — C3 AND C4
C3 Complement: 145 mg/dL (ref 83–193)
C4 Complement: 25 mg/dL (ref 15–57)

## 2020-07-15 LAB — ANTI-DNA ANTIBODY, DOUBLE-STRANDED: ds DNA Ab: 4 IU/mL

## 2020-07-15 LAB — SEDIMENTATION RATE: Sed Rate: 14 mm/h (ref 0–30)

## 2020-07-21 ENCOUNTER — Ambulatory Visit: Payer: BC Managed Care – PPO | Admitting: Neurology

## 2020-07-21 ENCOUNTER — Encounter: Payer: Self-pay | Admitting: Neurology

## 2020-07-21 DIAGNOSIS — Z029 Encounter for administrative examinations, unspecified: Secondary | ICD-10-CM

## 2020-08-03 ENCOUNTER — Telehealth: Payer: Self-pay | Admitting: Family Medicine

## 2020-08-03 NOTE — Telephone Encounter (Signed)
Left message for patient to call back and schedule Medicare Annual Wellness Visit (AWV) either virtually or in office.   Last AWV no information please schedule at anytime with [facility] Nurse Health Advisor 1 or 2   This should be a 45 minute visit. 

## 2020-09-14 ENCOUNTER — Other Ambulatory Visit: Payer: Self-pay | Admitting: Family Medicine

## 2020-09-14 DIAGNOSIS — I10 Essential (primary) hypertension: Secondary | ICD-10-CM

## 2020-09-15 DIAGNOSIS — M3210 Systemic lupus erythematosus, organ or system involvement unspecified: Secondary | ICD-10-CM | POA: Diagnosis not present

## 2020-09-15 DIAGNOSIS — H04123 Dry eye syndrome of bilateral lacrimal glands: Secondary | ICD-10-CM | POA: Diagnosis not present

## 2020-09-15 DIAGNOSIS — H2513 Age-related nuclear cataract, bilateral: Secondary | ICD-10-CM | POA: Diagnosis not present

## 2020-09-15 DIAGNOSIS — Z79899 Other long term (current) drug therapy: Secondary | ICD-10-CM | POA: Diagnosis not present

## 2020-09-15 LAB — HM DIABETES EYE EXAM

## 2020-10-13 ENCOUNTER — Other Ambulatory Visit: Payer: Self-pay | Admitting: Family Medicine

## 2020-10-13 ENCOUNTER — Other Ambulatory Visit: Payer: Self-pay | Admitting: Physician Assistant

## 2020-10-13 DIAGNOSIS — R232 Flushing: Secondary | ICD-10-CM

## 2020-10-13 DIAGNOSIS — C50511 Malignant neoplasm of lower-outer quadrant of right female breast: Secondary | ICD-10-CM

## 2020-10-13 NOTE — Telephone Encounter (Signed)
Last Visit: 07/14/2020 Next Visit: 12/14/2020 Labs: 07/14/2020, WBC count is mildly low but has improved from 2.9 on 02/11/20 to 3.4. She has long standing history of neutropenia. Glucose is 106. Rest of CMP WNL. ESR and complements WNL. UA normal.dsDNA negative. Labs are not consistent with a flare.    Eye exam: 09/15/2020  Current Dose per office note 07/14/2020, Plaquenil 200 mg 1 tablet by mouth daily. DX: Autoimmune disease   Last Fill: 06/29/2020  Okay to refill Plaquenil?

## 2020-10-21 ENCOUNTER — Telehealth: Payer: Self-pay | Admitting: Family Medicine

## 2020-10-21 NOTE — Telephone Encounter (Signed)
Left message for patient to call back and schedule Medicare Annual Wellness Visit (AWV) either virtually or in office. No detailed message   AWV-I per [place] 04/29/18  please schedule at anytime with [facility] Nurse Health Advisor 1 or 2   This should be a 45 minute visit.

## 2020-10-27 ENCOUNTER — Other Ambulatory Visit: Payer: Self-pay | Admitting: Obstetrics and Gynecology

## 2020-10-27 DIAGNOSIS — Z1231 Encounter for screening mammogram for malignant neoplasm of breast: Secondary | ICD-10-CM

## 2020-10-31 ENCOUNTER — Telehealth: Payer: Self-pay | Admitting: Neurology

## 2020-10-31 NOTE — Telephone Encounter (Signed)
Pt face pain and headache has been going on for 2 weeks, she has been taken tylenol 3 to 4 time a day daily pain. At 1st it starts as a muscle spasm and then the pain and headache starts,

## 2020-10-31 NOTE — Telephone Encounter (Signed)
Patient wants to speak to someone about Face pain on the rt side, she is having headache and the pain is running down to the neck

## 2020-10-31 NOTE — Telephone Encounter (Signed)
Pt called and informed that She has not been seen since 2019, will need an appt, and that she will be added for waitlist. And that she needs to  Discuss with PCP on her upcoming appt as well, pt verbalized understanding, and transferred to the front to get scheduled

## 2020-10-31 NOTE — Telephone Encounter (Signed)
She has not been seen since 2019, will need an appt, ok for waitlist. Discuss with PCP on her upcoming appt as well, thanks

## 2020-11-03 ENCOUNTER — Encounter: Payer: Self-pay | Admitting: Rheumatology

## 2020-11-03 ENCOUNTER — Ambulatory Visit (INDEPENDENT_AMBULATORY_CARE_PROVIDER_SITE_OTHER): Payer: BC Managed Care – PPO | Admitting: Rheumatology

## 2020-11-03 ENCOUNTER — Other Ambulatory Visit: Payer: Self-pay

## 2020-11-03 ENCOUNTER — Telehealth: Payer: Self-pay

## 2020-11-03 VITALS — BP 153/105 | HR 87 | Resp 15 | Ht 64.0 in | Wt 183.2 lb

## 2020-11-03 DIAGNOSIS — G4486 Cervicogenic headache: Secondary | ICD-10-CM

## 2020-11-03 DIAGNOSIS — Z79899 Other long term (current) drug therapy: Secondary | ICD-10-CM

## 2020-11-03 DIAGNOSIS — I10 Essential (primary) hypertension: Secondary | ICD-10-CM

## 2020-11-03 DIAGNOSIS — R7989 Other specified abnormal findings of blood chemistry: Secondary | ICD-10-CM

## 2020-11-03 DIAGNOSIS — M5030 Other cervical disc degeneration, unspecified cervical region: Secondary | ICD-10-CM | POA: Diagnosis not present

## 2020-11-03 DIAGNOSIS — M797 Fibromyalgia: Secondary | ICD-10-CM

## 2020-11-03 DIAGNOSIS — M19072 Primary osteoarthritis, left ankle and foot: Secondary | ICD-10-CM

## 2020-11-03 DIAGNOSIS — Z170 Estrogen receptor positive status [ER+]: Secondary | ICD-10-CM

## 2020-11-03 DIAGNOSIS — M62838 Other muscle spasm: Secondary | ICD-10-CM | POA: Diagnosis not present

## 2020-11-03 DIAGNOSIS — M19041 Primary osteoarthritis, right hand: Secondary | ICD-10-CM

## 2020-11-03 DIAGNOSIS — R768 Other specified abnormal immunological findings in serum: Secondary | ICD-10-CM

## 2020-11-03 DIAGNOSIS — M533 Sacrococcygeal disorders, not elsewhere classified: Secondary | ICD-10-CM

## 2020-11-03 DIAGNOSIS — R7681 Abnormal rheumatoid factor and anti-citrullinated protein antibody without rheumatoid arthritis: Secondary | ICD-10-CM

## 2020-11-03 DIAGNOSIS — M160 Bilateral primary osteoarthritis of hip: Secondary | ICD-10-CM | POA: Diagnosis not present

## 2020-11-03 DIAGNOSIS — M8580 Other specified disorders of bone density and structure, unspecified site: Secondary | ICD-10-CM

## 2020-11-03 DIAGNOSIS — G8929 Other chronic pain: Secondary | ICD-10-CM

## 2020-11-03 DIAGNOSIS — C50511 Malignant neoplasm of lower-outer quadrant of right female breast: Secondary | ICD-10-CM

## 2020-11-03 DIAGNOSIS — M19071 Primary osteoarthritis, right ankle and foot: Secondary | ICD-10-CM

## 2020-11-03 DIAGNOSIS — M359 Systemic involvement of connective tissue, unspecified: Secondary | ICD-10-CM | POA: Diagnosis not present

## 2020-11-03 DIAGNOSIS — M19042 Primary osteoarthritis, left hand: Secondary | ICD-10-CM

## 2020-11-03 DIAGNOSIS — G620 Drug-induced polyneuropathy: Secondary | ICD-10-CM

## 2020-11-03 DIAGNOSIS — T451X5A Adverse effect of antineoplastic and immunosuppressive drugs, initial encounter: Secondary | ICD-10-CM

## 2020-11-03 MED ORDER — LIDOCAINE HCL 1 % IJ SOLN
0.5000 mL | INTRAMUSCULAR | Status: AC | PRN
  Administered 2020-11-03: .5 mL

## 2020-11-03 MED ORDER — TRIAMCINOLONE ACETONIDE 40 MG/ML IJ SUSP
10.0000 mg | INTRAMUSCULAR | Status: AC | PRN
  Administered 2020-11-03: 10 mg via INTRAMUSCULAR

## 2020-11-03 NOTE — Telephone Encounter (Signed)
Pt called asking to be seen sooner than the appointment she has scheduled pt advised that we have her on a waiting list if something was to come open we will call her, pt stated that this morning she almost went to the ER pt advised that if her headache keeps getting worse than she may need to go to the ER to be evaluated and treated. Pt verbalized understanding. Pt then asked about recommendations for a different neurologist pt advised that I can not giver a referral for a neurologist that if she was wanting to go somewhere new she would have to talk to her PCP. Again pt was reminded that we have her on a wait list for a sooner appt, pt then asked for pain medication and a refill on a script on medication pt advised that we do not give out pain medication and that we can not refill her medication because she was last seen in 2019.

## 2020-11-03 NOTE — Progress Notes (Signed)
Office Visit Note  Patient: Tina [name]             Date of Birth: [birth year]           MRN: [id number]             PCP: Martinique, Betty G, MD Referring: Martinique, Betty G, MD Visit Date: 11/03/2020 Occupation: @GUAROCC @  Subjective:  Right-sided neck pain   History of Present Illness: Katonya Blecher is a 60 y.o. female with a history of autoimmune disease and osteoarthritis.  She states for the last month she has been having neck pain and headaches.  She also had some facial pain.  She had an appointment with the neurologist in December but she missed it and now that she does not have an appointment till July.  She denies any visual changes.  She continues to have dry mouth and dry eyes.  She wants to have a trigger point injection in the right trapezius area.  She states she has injection in the past which was very helpful.  She denies any history of oral ulcers or nasal ulcers.  There is no history of malar rash photosensitivity or inflammatory arthritis.  Activities of Daily Living:  Patient reports morning stiffness for all day. Patient Reports nocturnal pain.  Difficulty dressing/grooming: Denies Difficulty climbing stairs: Denies Difficulty getting out of chair: Denies Difficulty using hands for taps, buttons, cutlery, and/or writing: Denies  Review of Systems  Constitutional: Positive for fatigue.  HENT: Positive for mouth dryness and nose dryness. Negative for mouth sores.   Eyes: Positive for dryness. Negative for pain and itching.  Respiratory: Negative for shortness of breath and difficulty breathing.   Cardiovascular: Negative for chest pain and palpitations.  Gastrointestinal: Negative for blood in stool, constipation and diarrhea.  Endocrine: Negative for increased urination.  Genitourinary: Negative for difficulty urinating.  Musculoskeletal: Positive for arthralgias, joint pain, myalgias, muscle tenderness and myalgias. Negative for joint swelling and morning  stiffness.  Skin: Negative for color change, rash and redness.  Allergic/Immunologic: Negative for susceptible to infections.  Neurological: Positive for headaches, parasthesias and weakness. Negative for dizziness, numbness and memory loss.  Hematological: Negative for bruising/bleeding tendency.  Psychiatric/Behavioral: Negative for confusion.    PMFS History:  Patient Active Problem List   Diagnosis Date Noted  . Controlled type 2 diabetes mellitus with complication, without long-term current use of insulin ([place]) 05/07/2019  . Hyperlipidemia 05/05/2019  . Obesity (BMI 30-39.9) 05/05/2019  . Fibromyalgia 02/28/2016  . Chronic low back pain 01/24/2016  . Lupus ([place]) 04/19/2015  . GERD (gastroesophageal reflux disease) 04/19/2015  . Cervicogenic headache 02/10/2015  . Neck pain 02/10/2015  . Hot flashes 10/26/2014  . GAD (generalized anxiety disorder) 10/26/2014  . Peripheral neuropathy due to chemotherapy ([place]) 03/12/2014  . Breast cancer of lower-outer quadrant of right female breast ([place]) 09/15/2013  . Left shoulder pain - seeing Dr. Lorin Mercy 08/20/2013  . Hypertension     Past Medical History:  Diagnosis Date  . Anxiety   . Breast cancer ([place]) 2015   Right Breast Cancer  . Cancer of right breast ([place]) 09/03/13   Invasive Ductal Carcinoma/Ductal Carcinoma Insitu  biopsies  . Chronic low back pain 01/24/2016   -and R hip pain -seen by rheumatologist and ortho in the past -MRI with Dr. Lorin Mercy in 2015, R hip plain films in 2015 as well acupuncture, injections, tramadol not helpful trigger [facility] and PT helpful   . Degenerative joint disease  back neck and shoulder  . Fibromyalgia    Dr. Estanislado Pandy  . GERD (gastroesophageal reflux disease)    hx esophagitis  . Hot flashes   . Hypertension   . Leukopenia 06/16/2013  . Lupus [facility])    rheumatologist - Dr. Estanislado Pandy  . Migraines   . Neuromuscular disorder ([place])    neuropathy from chemo in feet/hands  .  Personal history of chemotherapy    2015  . Personal history of radiation therapy 2015   Right Breast Cancer  . S/P radiation therapy 04/27/2014-06/07/2014   1) Right breast, 50 Gy in 25 fractions/ 2) Right breast boost, 10 Gy in 5 fractions    Family History  Problem Relation Age of Onset  . Hypertension Mother   . Dementia Mother        small vessel disease  . Cancer Mother 30       cervical  cancer  . Cancer Father 70       prostate ca  . Diabetes Father   . Arthritis Father   . Cancer Half-Sister   . Breast cancer Neg Hx    Past Surgical History:  Procedure Laterality Date  . BREAST BIOPSY Left 2015   benign core  . BREAST BIOPSY Right 2015   malignant core   . BREAST LUMPECTOMY Right 2015  . BREAST SURGERY  03/01/2014   hx right breast cancer- 3 lymph nodes removed and lumpectomy  . [place]   rt  . CESAREAN SECTION     x 3  . PORTACATH PLACEMENT  08/2013   still currently active   . ROBOTIC ASSISTED BILATERAL SALPINGO OOPHERECTOMY Bilateral 07/09/2014   Procedure: ROBOTIC ASSISTED BILATERAL SALPINGO OOPHORECTOM; :LYSIS OF ADHESIONS;  Surgeon: Marvene Staff, MD;  Location: [facility];  Service: Gynecology;  Laterality: Bilateral;  . TRIGGER FINGER RELEASE  2004   rt thumb  . trigger finger release left thumb  06/28/14   at [facility]  . TUBAL LIGATION    . WISDOM TOOTH EXTRACTION     Social History   Social History Narrative   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      Home Situation: lives with husband and 7 yo son      Spiritual Beliefs: Christian      Lifestyle: elliptical 3-4 times per week; working on diet as well over last year in 2014            Immunization History  Administered Date(s) Administered  . Influenza Split 06/16/2012  . Influenza,inj,Quad PF,6+ Mos 06/17/2013, 04/23/2014, 04/25/2015, 05/17/2017, 05/05/2019  . PFIZER(Purple Top)SARS-COV-2 Vaccination 10/09/2019, 11/02/2019  . Pneumococcal  Polysaccharide-23 02/16/2015     Objective: Vital Signs: BP (!) 153/105 (BP Location: Left Arm, Patient Position: Sitting, Cuff Size: Normal)   Pulse 87   Resp 15   Ht 5\' 4"  (1.626 m)   Wt 183 lb 3.2 oz (83.1 kg)   LMP 09/28/2013 (Approximate)   BMI 31.45 kg/m    Physical Exam Vitals and nursing note reviewed.  Constitutional:      Appearance: She is well-developed.  HENT:     Head: Normocephalic and atraumatic.  Eyes:     Conjunctiva/sclera: Conjunctivae normal.  Cardiovascular:     Rate and Rhythm: Normal rate and regular rhythm.     Heart sounds: Normal heart sounds.  Pulmonary:     Effort: Pulmonary effort is normal.     Breath sounds: Normal breath sounds.  Abdominal:  General: Bowel sounds are normal.     Palpations: Abdomen is soft.  Musculoskeletal:     Cervical back: Normal range of motion.  Lymphadenopathy:     Cervical: No cervical adenopathy.  Skin:    General: Skin is warm and dry.     Capillary Refill: Capillary refill takes less than 2 seconds.  Neurological:     Mental Status: She is alert and oriented to person, place, and time.  Psychiatric:        Behavior: Behavior normal.      Musculoskeletal Exam: C-spine was in good range of motion.  She had tenderness over right trapezius region.  She also had discomfort with lateral rotation of her cervical spine.  Shoulder joints, elbow joints, wrist joints, MCPs PIPs and DIPs with good range of motion with no synovitis.  Hip joints, knee joints, ankles, MTPs and PIPs with good range of motion with no synovitis.  CDAI Exam: CDAI Score: -- Patient Global: --; Provider Global: -- Swollen: --; Tender: -- Joint Exam 11/03/2020   No joint exam has been documented for this visit   There is currently no information documented on the homunculus. Go to the Rheumatology activity and complete the homunculus joint exam.  Investigation: No additional findings.  Imaging: No results found.  Recent Labs: Lab  Results  Component Value Date   WBC 3.4 (L) 07/14/2020   HGB 12.5 07/14/2020   PLT 271 07/14/2020   NA 139 07/14/2020   K 3.5 07/14/2020   CL 101 07/14/2020   CO2 32 07/14/2020   GLUCOSE 106 (H) 07/14/2020   BUN 15 07/14/2020   CREATININE 0.98 07/14/2020   BILITOT 0.6 07/14/2020   ALKPHOS 54 10/11/2016   AST 25 07/14/2020   ALT 21 07/14/2020   PROT 7.3 07/14/2020   ALBUMIN 4.3 10/11/2016   CALCIUM 9.5 07/14/2020   GFRAA 74 07/14/2020    Speciality Comments: PLQ eye exam: 09/15/2020 normal. [facility]. Follow up in 6 months.  Procedures:  Trigger Point Inj  Date/Time: 11/03/2020 12:45 PM Performed by: [name], MD Authorized by: [name], MD   Consent Given by:  Patient Site marked: the procedure site was marked   Timeout: prior to procedure the correct patient, procedure, and site was verified   Indications:  Muscle spasm, pain and therapeutic Total # of Trigger Points:  1 Location: neck   Needle Size:  27 G Approach:  Dorsal Medications #1:  0.5 mL lidocaine 1 %; 10 mg triamcinolone acetonide 40 MG/ML Patient tolerance:  Patient tolerated the procedure well with no immediate complications   Allergies: Adhesive [tape], Other, and Nickel   Assessment / Plan:     Visit Diagnoses: Autoimmune disease ([place]) - + ANA 1:320 Speckled-patient had past Hx of neutropenia, fatigue, oral ulcers, sicca symptoms, parotitis, positive anti-CCP.  She continues to have sicca symptoms.  There is no history of recent episodes of oral ulcers.  She had no synovitis on examination.  I will obtain following labs today.- Plan: Anti-DNA antibody, double-stranded, C3 and C4, Sedimentation rate, Urinalysis, Routine w reflex microscopic  High risk medication use - Plaquenil 200 mg 1 tablet by mouth daily. PLQ eye exam: 09/15/2020  - Plan: CBC with Differential/Platelet, COMPLETE METABOLIC PANEL WITH GFR today and then every 5 months to monitor for drug  toxicity.  Cyclic citrullinated peptide (CCP) antibody positive-she had no synovitis on examination.  Primary osteoarthritis of both hands-she has bilateral PIP and DIP thickening.  Joint protection was  discussed.  Primary osteoarthritis of both hips-she had good range of motion of bilateral hip joints without discomfort.  Primary osteoarthritis of both feet-proper fitting shoes were discussed.  DDD (degenerative disc disease), cervical-she continues to have neck pain.  Trapezius muscle spasm-she had a spasm in the right trapezius region.  Per her request right trapezius area was injected with cortisone as described above.  She tolerated the procedure well.  Cervicogenic headache-she complains of ongoing headaches for the last 1 month.  She has an appointment with her neurologist in July.  She will contact for an earlier appointment.  Chronic right SI joint pain-improved.  Fibromyalgia-she continues to have generalized pain and discomfort from fibromyalgia.  Need for stretching exercises and good sleep hygiene was discussed.  Osteopenia, unspecified location - DXA done by her GYN on 02/08/2020 showed a T score of -1.6, BMD 0.979 in the lumbar region.  Use of calcium, vitamin D and resistive exercises was discussed.  Other medical problems are listed as follows:  Malignant neoplasm of lower-outer quadrant of right breast of female, estrogen receptor positive ([place])  Peripheral neuropathy due to chemotherapy ([place]) - Followed by Dr.Aquino  Essential hypertension-her blood pressure was elevated today.  She has been advised to monitor blood pressure closely and follow-up with the PCP for the management of hypertension.  High blood pressure can also contribute to headaches.  Dietary modifications were discussed.  Increased risk of heart disease in people with autoimmune disease was discussed.  Need for regular exercise was discussed.  Instructions were placed in the AVS.  Orders: Orders Placed  This Encounter  Procedures  . CBC with Differential/Platelet  . COMPLETE METABOLIC PANEL WITH GFR  . Anti-DNA antibody, double-stranded  . C3 and C4  . Sedimentation rate  . Urinalysis, Routine w reflex microscopic   No orders of the defined types were placed in this encounter.   Follow-Up Instructions: Return in about 5 months (around 04/05/2021) for Autoimmune disease, Osteoarthritis.   [name], MD  Note - This record has been created using Editor, commissioning.  Chart creation errors have been sought, but may not always  have been located. Such creation errors do not reflect on  the standard of medical care.

## 2020-11-03 NOTE — Patient Instructions (Addendum)
Standing Labs We placed an order today for your standing lab work.   Please have your standing labs drawn in September  If possible, please have your labs drawn 2 weeks prior to your appointment so that the provider can discuss your results at your appointment.  We have open lab daily Monday through Thursday from 1:30-4:30 PM and Friday from 1:30-4:00 PM at the office of Dr. [name], [facility].   Please be advised, all patients with office appointments requiring lab work will take precedents over walk-in lab work.  If possible, please come for your lab work on Monday and Friday afternoons, as you may experience shorter wait times. The office is located at [address], [place], [place], [place] [address] No appointment is necessary.   Labs are drawn by [facility]. Please bring your co-pay at the time of your lab draw.  You may receive a bill from [facility] for your lab work.  If you wish to have your labs drawn at another location, please call the office 24 hours in advance to send orders.  If you have any questions regarding directions or hours of operation,  please call [phone].   As a reminder, please drink plenty of water prior to coming for your lab work. Thanks!  COVID-19 vaccine recommendations:   COVID-19 vaccine is recommended for everyone (unless you are allergic to a vaccine component), even if you are on a medication that suppresses your immune system.   The recommendations are that the individuals on immunosuppressive agents should get the first 3 COVID-19 vaccine 1 month apart and then a fourth dose (booster) 3 months after the third dose.  Do not take Tylenol or any anti-inflammatory medications (NSAIDs) 24 hours prior to the COVID-19 vaccination.   There is no direct evidence about the efficacy of the COVID-19 vaccine in individuals who are on medications that suppress the immune system.   Even if you are fully vaccinated, and you are on any  medications that suppress your immune system, please continue to wear a mask, maintain at least six feet social distance and practice hand hygiene.   If you develop a COVID-19 infection, please contact your PCP or our office to determine if you need monoclonal antibody infusion.  The booster vaccine is now available for immunocompromised patients.   Please see the following web sites for updated information.   [email or web address]   Vaccines You are taking a medication(s) that can suppress your immune system.  The following immunizations are recommended: . Flu annually . Covid-19  . Pneumonia (Pneumovax 23 and Prevnar 13 spaced at least 1 year apart) . Shingrix (after age 53)  Please check with your PCP to make sure you are up to date.   Heart Disease Prevention   Your inflammatory disease increases your risk of heart disease which includes heart attack, stroke, atrial fibrillation (irregular heartbeats), high blood pressure, heart failure and atherosclerosis (plaque in the arteries).  It is important to reduce your risk by:   . Keep blood pressure, cholesterol, and blood sugar at healthy levels   . Smoking Cessation   . Maintain a healthy weight  o BMI 20-25   . Eat a healthy diet  o Plenty of fresh fruit, vegetables, and whole grains  o Limit saturated fats, foods high in sodium, and added sugars  o DASH and Mediterranean diet   . Increase physical activity  o Recommend moderate physically activity for 150 minutes per week/ 30 minutes a day for five  days a week These can be broken up into three separate ten-minute sessions during the day.   . Reduce Stress  . Meditation, slow breathing exercises, yoga, coloring books  . Dental visits twice a year    Cervical Strain and Sprain Rehab Ask your health care provider which exercises are safe for you. Do exercises exactly as told by your health care provider  and adjust them as directed. It is normal to feel mild stretching, pulling, tightness, or discomfort as you do these exercises. Stop right away if you feel sudden pain or your pain gets worse. Do not begin these exercises until told by your health care provider. Stretching and range-of-motion exercises Cervical side bending 1. Using good posture, sit on a stable chair or stand up. 2. Without moving your shoulders, slowly tilt your left / right ear to your shoulder until you feel a stretch in the opposite side neck muscles. You should be looking straight ahead. 3. Hold for __________ seconds. 4. Repeat with the other side of your neck. Repeat __________ times. Complete this exercise __________ times a day.   Cervical rotation 1. Using good posture, sit on a stable chair or stand up. 2. Slowly turn your head to the side as if you are looking over your left / right shoulder. ? Keep your eyes level with the ground. ? Stop when you feel a stretch along the side and the back of your neck. 3. Hold for __________ seconds. 4. Repeat this by turning to your other side. Repeat __________ times. Complete this exercise __________ times a day.   Thoracic extension and pectoral stretch 1. Roll a towel or a small blanket so it is about 4 inches (10 cm) in diameter. 2. Lie down on your back on a firm surface. 3. Put the towel lengthwise, under your spine in the middle of your back. It should not be under your shoulder blades. The towel should line up with your spine from your middle back to your lower back. 4. Put your hands behind your head and let your elbows fall out to your sides. 5. Hold for __________ seconds. Repeat __________ times. Complete this exercise __________ times a day. Strengthening exercises Isometric upper cervical flexion 1. Lie on your back with a thin pillow behind your head and a small rolled-up towel under your neck. 2. Gently tuck your chin toward your chest and nod your head down  to look toward your feet. Do not lift your head off the pillow. 3. Hold for __________ seconds. 4. Release the tension slowly. Relax your neck muscles completely before you repeat this exercise. Repeat __________ times. Complete this exercise __________ times a day. Isometric cervical extension 1. Stand about 6 inches (15 cm) away from a wall, with your back facing the wall. 2. Place a soft object, about 6-8 inches (15-20 cm) in diameter, between the back of your head and the wall. A soft object could be a small pillow, a ball, or a folded towel. 3. Gently tilt your head back and press into the soft object. Keep your jaw and forehead relaxed. 4. Hold for __________ seconds. 5. Release the tension slowly. Relax your neck muscles completely before you repeat this exercise. Repeat __________ times. Complete this exercise __________ times a day.   Posture and body mechanics Body mechanics refers to the movements and positions of your body while you do your daily activities. Posture is part of body mechanics. Good posture and healthy body mechanics can help to relieve  stress in your body's tissues and joints. Good posture means that your spine is in its natural S-curve position (your spine is neutral), your shoulders are pulled back slightly, and your head is not tipped forward. The following are general guidelines for applying improved posture and body mechanics to your everyday activities. Sitting 1. When sitting, keep your spine neutral and keep your feet flat on the floor. Use a footrest, if necessary, and keep your thighs parallel to the floor. Avoid rounding your shoulders, and avoid tilting your head forward. 2. When working at a desk or a computer, keep your desk at a height where your hands are slightly lower than your elbows. Slide your chair under your desk so you are close enough to maintain good posture. 3. When working at a computer, place your monitor at a height where you are looking  straight ahead and you do not have to tilt your head forward or downward to look at the screen.   Standing  When standing, keep your spine neutral and keep your feet about hip-width apart. Keep a slight bend in your knees. Your ears, shoulders, and hips should line up.  When you do a task in which you stand in one place for a long time, place one foot up on a stable object that is 2-4 inches (5-10 cm) high, such as a footstool. This helps keep your spine neutral.   Resting When lying down and resting, avoid positions that are most painful for you. Try to support your neck in a neutral position. You can use a contour pillow or a small rolled-up towel. Your pillow should support your neck but not push on it. This information is not intended to replace advice given to you by your health care provider. Make sure you discuss any questions you have with your health care provider. Document Revised: 11/05/2018 Document Reviewed: 04/16/2018 Elsevier Patient Education  2021 [place] blood pressure is elevated.  Please schedule appointment with your PCP for the treatment of hypertension.
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
## 2020-11-04 NOTE — Telephone Encounter (Signed)
Agree with putting on waitlist, thanks

## 2020-11-06 LAB — URINALYSIS, ROUTINE W REFLEX MICROSCOPIC
Bacteria, UA: NONE SEEN /HPF
Bilirubin Urine: NEGATIVE
Glucose, UA: NEGATIVE
Hgb urine dipstick: NEGATIVE
Ketones, ur: NEGATIVE
Leukocytes,Ua: NEGATIVE
Nitrite: NEGATIVE
Specific Gravity, Urine: 1.027 (ref 1.001–1.03)
pH: 7 (ref 5.0–8.0)

## 2020-11-06 LAB — CBC WITH DIFFERENTIAL/PLATELET
Absolute Monocytes: 259 cells/uL (ref 200–950)
Basophils Absolute: 32 cells/uL (ref 0–200)
Basophils Relative: 0.9 %
Eosinophils Absolute: 32 cells/uL (ref 15–500)
Eosinophils Relative: 0.9 %
HCT: 40.3 % (ref 35.0–45.0)
Hemoglobin: 13.3 g/dL (ref 11.7–15.5)
Lymphs Abs: 1908 cells/uL (ref 850–3900)
MCH: 30.2 pg (ref 27.0–33.0)
MCHC: 33 g/dL (ref 32.0–36.0)
MCV: 91.4 fL (ref 80.0–100.0)
MPV: 9.6 fL (ref 7.5–12.5)
Monocytes Relative: 7.4 %
Neutro Abs: 1271 cells/uL — ABNORMAL LOW (ref 1500–7800)
Neutrophils Relative %: 36.3 %
Platelets: 274 10*3/uL (ref 140–400)
RBC: 4.41 10*6/uL (ref 3.80–5.10)
RDW: 13.4 % (ref 11.0–15.0)
Total Lymphocyte: 54.5 %
WBC: 3.5 10*3/uL — ABNORMAL LOW (ref 3.8–10.8)

## 2020-11-06 LAB — COMPLETE METABOLIC PANEL WITH GFR
AG Ratio: 1.2 (calc) (ref 1.0–2.5)
ALT: 21 U/L (ref 6–29)
AST: 26 U/L (ref 10–35)
Albumin: 4.4 g/dL (ref 3.6–5.1)
Alkaline phosphatase (APISO): 60 U/L (ref 37–153)
BUN/Creatinine Ratio: 14 (calc) (ref 6–22)
BUN: 15 mg/dL (ref 7–25)
CO2: 32 mmol/L (ref 20–32)
Calcium: 9.8 mg/dL (ref 8.6–10.4)
Chloride: 100 mmol/L (ref 98–110)
Creat: 1.1 mg/dL — ABNORMAL HIGH (ref 0.50–1.05)
GFR, Est African American: 64 mL/min/{1.73_m2} (ref >=60)
GFR, Est Non African American: 55 mL/min/{1.73_m2} — ABNORMAL LOW (ref >=60)
Globulin: 3.6 g/dL (calc) (ref 1.9–3.7)
Glucose, Bld: 78 mg/dL (ref 65–99)
Potassium: 4 mmol/L (ref 3.5–5.3)
Sodium: 139 mmol/L (ref 135–146)
Total Bilirubin: 0.4 mg/dL (ref 0.2–1.2)
Total Protein: 8 g/dL (ref 6.1–8.1)

## 2020-11-06 LAB — TEST AUTHORIZATION

## 2020-11-06 LAB — PROTEIN / CREATININE RATIO, URINE
Creatinine, Urine: 229 mg/dL (ref 20–275)
Protein/Creat Ratio: 79 mg/g creat (ref 21–161)
Protein/Creatinine Ratio: 0.079 mg/mg creat (ref 0.021–0.16)
Total Protein, Urine: 18 mg/dL (ref 5–24)

## 2020-11-06 LAB — ANTI-DNA ANTIBODY, DOUBLE-STRANDED: ds DNA Ab: 4 IU/mL

## 2020-11-06 LAB — C3 AND C4
C3 Complement: 147 mg/dL (ref 83–193)
C4 Complement: 23 mg/dL (ref 15–57)

## 2020-11-06 LAB — SEDIMENTATION RATE: Sed Rate: 17 mm/h (ref 0–30)

## 2020-11-06 LAB — MICROSCOPIC MESSAGE

## 2020-11-07 ENCOUNTER — Other Ambulatory Visit: Payer: Self-pay

## 2020-11-07 ENCOUNTER — Ambulatory Visit (INDEPENDENT_AMBULATORY_CARE_PROVIDER_SITE_OTHER): Payer: BC Managed Care – PPO

## 2020-11-07 ENCOUNTER — Ambulatory Visit (INDEPENDENT_AMBULATORY_CARE_PROVIDER_SITE_OTHER): Payer: BC Managed Care – PPO | Admitting: Family Medicine

## 2020-11-07 ENCOUNTER — Encounter: Payer: Self-pay | Admitting: Family Medicine

## 2020-11-07 VITALS — BP 182/108 | HR 89 | Temp 98.6°F | Wt 179.0 lb

## 2020-11-07 VITALS — BP 162/98 | HR 98 | Temp 98.6°F | Wt 189.0 lb

## 2020-11-07 DIAGNOSIS — Z0000 Encounter for general adult medical examination without abnormal findings: Secondary | ICD-10-CM

## 2020-11-07 DIAGNOSIS — R519 Headache, unspecified: Secondary | ICD-10-CM

## 2020-11-07 DIAGNOSIS — I10 Essential (primary) hypertension: Secondary | ICD-10-CM

## 2020-11-07 DIAGNOSIS — G4486 Cervicogenic headache: Secondary | ICD-10-CM

## 2020-11-07 DIAGNOSIS — Z9189 Other specified personal risk factors, not elsewhere classified: Secondary | ICD-10-CM | POA: Diagnosis not present

## 2020-11-07 MED ORDER — AMLODIPINE BESYLATE 5 MG PO TABS: 5.0000 mg | ORAL_TABLET | Freq: Every day | ORAL | 5 refills | Status: DC

## 2020-11-07 MED ORDER — SUMATRIPTAN SUCCINATE 25 MG PO TABS: 25.0000 mg | ORAL_TABLET | ORAL | 0 refills | Status: DC | PRN

## 2020-11-07 NOTE — Patient Instructions (Signed)

## 2020-11-07 NOTE — Patient Instructions (Addendum)
[name] , Thank you for taking time to come for your Medicare Wellness Visit. I appreciate your ongoing commitment to your health goals. Please review the following plan we discussed and let me know if I can assist you in the future.   Screening recommendations/referrals: Colonoscopy:01/02/2016  Due 01/01/2026 Mammogram: currently scheduled 11/10/2020 Bone Density: to be determined  Recommended yearly ophthalmology/optometry visit for glaucoma screening and checkup Recommended yearly dental visit for hygiene and checkup  Vaccinations: Influenza vaccine: current due fall 2022 Pneumococcal vaccine: completed series  Tdap vaccine: current 10/30/2014 due 10/29/2024 Shingles vaccine: will obtain local pharmacy    Advanced directives: will provide copies   Conditions/risks identified: Chronic headache with elevated BP will be evaluated by Dr. Elease Hashimoto today.   Next appointment: 11/07/2020  @330  Dr. Elease Hashimoto    Preventive Care 58 Years and Older, Female Preventive care refers to lifestyle choices and visits with your health care provider that can promote health and wellness. What does preventive care include?  A yearly physical exam. This is also called an annual well check.  Dental exams once or twice a year.  Routine eye exams. Ask your health care provider how often you should have your eyes checked.  Personal lifestyle choices, including:  Daily care of your teeth and gums.  Regular physical activity.  Eating a healthy diet.  Avoiding tobacco and drug use.  Limiting alcohol use.  Practicing safe sex.  Taking low-dose aspirin every day.  Taking vitamin and mineral supplements as recommended by your health care provider. What happens during an annual well check? The services and screenings done by your health care provider during your annual well check will depend on your age, overall health, lifestyle risk factors, and family history of disease. Counseling   Your health care provider may ask you questions about your:  Alcohol use.  Tobacco use.  Drug use.  Emotional well-being.  Home and relationship well-being.  Sexual activity.  Eating habits.  History of falls.  Memory and ability to understand (cognition).  Work and work Statistician.  Reproductive health. Screening  You may have the following tests or measurements:  Height, weight, and BMI.  Blood pressure.  Lipid and cholesterol levels. These may be checked every 5 years, or more frequently if you are over 65 years old.  Skin check.  Lung cancer screening. You may have this screening every year starting at age 56 if you have a 30-pack-year history of smoking and currently smoke or have quit within the past 15 years.  Fecal occult blood test (FOBT) of the stool. You may have this test every year starting at age 23.  Flexible sigmoidoscopy or colonoscopy. You may have a sigmoidoscopy every 5 years or a colonoscopy every 10 years starting at age 40.  Hepatitis C blood test.  Hepatitis B blood test.  Sexually transmitted disease (STD) testing.  Diabetes screening. This is done by checking your blood sugar (glucose) after you have not eaten for a while (fasting). You may have this done every 1-3 years.  Bone density scan. This is done to screen for osteoporosis. You may have this done starting at age 105.  Mammogram. This may be done every 1-2 years. Talk to your health care provider about how often you should have regular mammograms. Talk with your health care provider about your test results, treatment options, and if necessary, the need for more tests. Vaccines  Your health care provider may recommend certain vaccines, such as:  Influenza vaccine. This is  recommended every year.  Tetanus, diphtheria, and acellular pertussis (Tdap, Td) vaccine. You may need a Td booster every 10 years.  Zoster vaccine. You may need this after age 55.  Pneumococcal 13-valent  conjugate (PCV13) vaccine. One dose is recommended after age 58.  Pneumococcal polysaccharide (PPSV23) vaccine. One dose is recommended after age 59. Talk to your health care provider about which screenings and vaccines you need and how often you need them. This information is not intended to replace advice given to you by your health care provider. Make sure you discuss any questions you have with your health care provider. Document Released: 08/12/2015 Document Revised: 04/04/2016 Document Reviewed: 05/17/2015 Elsevier Interactive Patient Education  2017 [place] Prevention in the Home Falls can cause injuries. They can happen to people of all ages. There are many things you can do to make your home safe and to help prevent falls. What can I do on the outside of my home?  Regularly fix the edges of walkways and driveways and fix any cracks.  Remove anything that might make you trip as you walk through a door, such as a raised step or threshold.  Trim any bushes or trees on the path to your home.  Use bright outdoor lighting.  Clear any walking paths of anything that might make someone trip, such as rocks or tools.  Regularly check to see if handrails are loose or broken. Make sure that both sides of any steps have handrails.  Any raised decks and porches should have guardrails on the edges.  Have any leaves, snow, or ice cleared regularly.  Use sand or salt on walking paths during winter.  Clean up any spills in your garage right away. This includes oil or grease spills. What can I do in the bathroom?  Use night lights.  Install grab bars by the toilet and in the tub and shower. Do not use towel bars as grab bars.  Use non-skid mats or decals in the tub or shower.  If you need to sit down in the shower, use a plastic, non-slip stool.  Keep the floor dry. Clean up any water that spills on the floor as soon as it happens.  Remove soap buildup in the tub or  shower regularly.  Attach bath mats securely with double-sided non-slip rug tape.  Do not have throw rugs and other things on the floor that can make you trip. What can I do in the bedroom?  Use night lights.  Make sure that you have a light by your bed that is easy to reach.  Do not use any sheets or blankets that are too big for your bed. They should not hang down onto the floor.  Have a firm chair that has side arms. You can use this for support while you get dressed.  Do not have throw rugs and other things on the floor that can make you trip. What can I do in the kitchen?  Clean up any spills right away.  Avoid walking on wet floors.  Keep items that you use a lot in easy-to-reach places.  If you need to reach something above you, use a strong step stool that has a grab bar.  Keep electrical cords out of the way.  Do not use floor polish or wax that makes floors slippery. If you must use wax, use non-skid floor wax.  Do not have throw rugs and other things on the floor that can make you trip.  What can I do with my stairs?  Do not leave any items on the stairs.  Make sure that there are handrails on both sides of the stairs and use them. Fix handrails that are broken or loose. Make sure that handrails are as long as the stairways.  Check any carpeting to make sure that it is firmly attached to the stairs. Fix any carpet that is loose or worn.  Avoid having throw rugs at the top or bottom of the stairs. If you do have throw rugs, attach them to the floor with carpet tape.  Make sure that you have a light switch at the top of the stairs and the bottom of the stairs. If you do not have them, ask someone to add them for you. What else can I do to help prevent falls?  Wear shoes that:  Do not have high heels.  Have rubber bottoms.  Are comfortable and fit you well.  Are closed at the toe. Do not wear sandals.  If you use a stepladder:  Make sure that it is fully  opened. Do not climb a closed stepladder.  Make sure that both sides of the stepladder are locked into place.  Ask someone to hold it for you, if possible.  Clearly mark and make sure that you can see:  Any grab bars or handrails.  First and last steps.  Where the edge of each step is.  Use tools that help you move around (mobility aids) if they are needed. These include:  Canes.  Walkers.  Scooters.  Crutches.  Turn on the lights when you go into a dark area. Replace any light bulbs as soon as they burn out.  Set up your furniture so you have a clear path. Avoid moving your furniture around.  If any of your floors are uneven, fix them.  If there are any pets around you, be aware of where they are.  Review your medicines with your doctor. Some medicines can make you feel dizzy. This can increase your chance of falling. Ask your doctor what other things that you can do to help prevent falls. This information is not intended to replace advice given to you by your health care provider. Make sure you discuss any questions you have with your health care provider. Document Released: 05/12/2009 Document Revised: 12/22/2015 Document Reviewed: 08/20/2014 Elsevier Interactive Patient Education  2017 [employer].

## 2020-11-07 NOTE — Progress Notes (Signed)
Established Patient Office Visit  Subjective:  Patient ID: Tina [name], female    DOB: 04/23/1961  Age: 60 y.o. MRN: [id number]  CC: No chief complaint on file.   HPI [name] presents for uncontrolled hypertension and persistent headaches.  She was actually visiting with our health coach today for wellness visit and had significantly elevated blood pressure of 162/98.  She had recent elevated blood pressure last Thursday at her rheumatologist 153/105.  She has known hypertension and takes lisinopril HCTZ 20/12.5 mg 1 daily.  Compliant with therapy.  Her other medical problems include history of GERD, controlled type 2 diabetes, history of breast cancer, history of peripheral neuropathy secondary to chemotherapy, chronic intermittent cervicogenic headache, and lupus.  Followed closely by rheumatology.  She had called neurology regarding her headache but cannot get in for several months.  She apparently had several elevated blood pressure readings recently.  Does not add a lot of salt.  Rare alcohol.  At one point took amlodipine and this was taken off because at one point her blood pressures again too low.  She denied any side effects at that time.  She does have fairly frequent chronic headaches.  She has seen neurology for these in the past.  She takes high-dose gabapentin 600 mg 3 times daily.  Her headaches have been classified as "cervicogenic "in the past.  She has right-sided headaches for the past 2 weeks with some sharpness shooting pains and occasional nausea without vomiting.  Occasional throbbing quality.  Tylenol helps temporarily.  She has had similar headaches in the past.  She has benefited from Imitrex in the past and requesting refill.  Denies any exertional headache.  No focal neurologic symptoms.  No confusion.  No stiff neck.  Past Medical History:  Diagnosis Date  . Anxiety   . Breast cancer ([place]) 2015   Right Breast Cancer  . Cancer of right breast ([place]) 09/03/13    Invasive Ductal Carcinoma/Ductal Carcinoma Insitu  biopsies  . Chronic low back pain 01/24/2016   -and R hip pain -seen by rheumatologist and ortho in the past -MRI with Dr. Lorin Mercy in 2015, R hip plain films in 2015 as well acupuncture, injections, tramadol not helpful trigger therapy [facility] and PT helpful   . Degenerative joint disease    back neck and shoulder  . Fibromyalgia    Dr. Estanislado Pandy  . GERD (gastroesophageal reflux disease)    hx esophagitis  . Hot flashes   . Hypertension   . Leukopenia 06/16/2013  . Lupus [facility])    rheumatologist - Dr. Estanislado Pandy  . Migraines   . Neuromuscular disorder ([place])    neuropathy from chemo in feet/hands  . Personal history of chemotherapy    2015  . Personal history of radiation therapy 2015   Right Breast Cancer  . S/P radiation therapy 04/27/2014-06/07/2014   1) Right breast, 50 Gy in 25 fractions/ 2) Right breast boost, 10 Gy in 5 fractions    Past Surgical History:  Procedure Laterality Date  . BREAST BIOPSY Left 2015   benign core  . BREAST BIOPSY Right 2015   malignant core   . BREAST LUMPECTOMY Right 2015  . BREAST SURGERY  03/01/2014   hx right breast cancer- 3 lymph nodes removed and lumpectomy  . [place]   rt  . CESAREAN SECTION     x 3  . PORTACATH PLACEMENT  08/2013   still currently active   . ROBOTIC ASSISTED  BILATERAL SALPINGO OOPHERECTOMY Bilateral 07/09/2014   Procedure: ROBOTIC ASSISTED BILATERAL SALPINGO OOPHORECTOM; :LYSIS OF ADHESIONS;  Surgeon: Marvene Staff, MD;  Location: [facility];  Service: Gynecology;  Laterality: Bilateral;  . TRIGGER FINGER RELEASE  2004   rt thumb  . trigger finger release left thumb  06/28/14   at [facility] surgical center  . TUBAL LIGATION    . WISDOM TOOTH EXTRACTION      Family History  Problem Relation Age of Onset  . Hypertension Mother   . Dementia Mother        small vessel disease  . Cancer Mother 30       cervical  cancer  .  Cancer Father 21       prostate ca  . Diabetes Father   . Arthritis Father   . Cancer Half-Sister   . Breast cancer Neg Hx     Social History   Socioeconomic History  . Marital status: Married    Spouse name: Not on file  . Number of children: 1  . Years of education: Not on file  . Highest education level: Not on file  Occupational History    Comment: homemaker  Tobacco Use  . Smoking status: Former Smoker    Packs/day: 1.00    Years: 15.00    Pack years: 15.00    Types: Cigars, Cigarettes    Quit date: 02/23/2004    Years since quitting: 16.7  . Smokeless tobacco: Never Used  . Tobacco comment: remote smoking history  Vaping Use  . Vaping Use: Never used  Substance and Sexual Activity  . Alcohol use: Yes    Alcohol/week: 0.0 standard drinks    Comment: rare  . Drug use: Yes    Types: Marijuana    Comment: Marijuana as needed   . Sexual activity: Yes  Other Topics Concern  . Not on file  Social History Narrative   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      Home Situation: lives with husband and 72 yo son      Spiritual Beliefs: Christian      Lifestyle: elliptical 3-4 times per week; working on diet as well over last year in 2014            Social Determinants of Health   Financial Resource Strain: Low Risk   . Difficulty of Paying Living Expenses: Not hard at all  Food Insecurity: No Food Insecurity  . Worried About Charity fundraiser in the Last Year: Never true  . Ran Out of Food in the Last Year: Never true  Transportation Needs: No Transportation Needs  . Lack of Transportation (Medical): No  . Lack of Transportation (Non-Medical): No  Physical Activity: Sufficiently Active  . Days of Exercise per Week: 5 days  . Minutes of Exercise per Session: 30 min  Stress: Stress Concern Present  . Feeling of Stress : To some extent  Social Connections: Socially Integrated  . Frequency of Communication with Friends and Family: More than three times a week  .  Frequency of Social Gatherings with Friends and Family: More than three times a week  . Attends Religious Services: More than 4 times per year  . Active Member of Clubs or Organizations: Yes  . Attends Archivist Meetings: 1 to 4 times per year  . Marital Status: Married  Human resources officer Violence: Not At Risk  . Fear of Current or Ex-Partner: No  . Emotionally Abused: No  . Physically Abused:  No  . Sexually Abused: No    Outpatient Medications Prior to Visit  Medication Sig Dispense Refill  . aspirin 81 MG tablet Take 81 mg by mouth daily.    Marland Kitchen b complex vitamins capsule Take 1 capsule by mouth daily.    Marland Kitchen BIOTIN 5000 PO Take 1 capsule by mouth. Takes sometimes    . Calcium Carbonate-Vit D-Min (CALTRATE 600+D PLUS MINERALS PO) Take 1 tablet by mouth daily.    Marland Kitchen gabapentin (NEURONTIN) 600 MG tablet Take 1 tablet (600 mg total) by mouth 3 (three) times daily. 90 tablet 6  . hydroxychloroquine (PLAQUENIL) 200 MG tablet Take 1 tablet by mouth once daily 90 tablet 0  . lisinopril-hydrochlorothiazide (ZESTORETIC) 20-12.5 MG tablet TAKE 1 TABLET BY MOUTH ONCE DAILY . APPOINTMENT REQUIRED FOR FUTURE REFILLS 30 tablet 0  . loratadine (CLARITIN) 10 MG tablet Take 10 mg by mouth as needed.    Marland Kitchen MAGNESIUM PO Take by mouth daily.    Marland Kitchen omeprazole (PRILOSEC) 40 MG capsule TAKE 1 CAPSULE BY MOUTH ONCE DAILY BEFORE BREAKFAST 90 capsule 0  . Propylene Glycol (SYSTANE COMPLETE OP) Apply to eye.    . TURMERIC PO Take by mouth daily.    Marland Kitchen venlafaxine XR (EFFEXOR-XR) 150 MG 24 hr capsule Take 1 capsule by mouth once daily 90 capsule 0  . SUMAtriptan (IMITREX) 25 MG tablet Take 1 tablet (25 mg total) by mouth every 2 (two) hours as needed for migraine. May repeat in 2 hours if needed. Do not take more then 2 tablets in 24 hours. 10 tablet 0   Facility-Administered Medications Prior to Visit  Medication Dose Route Frequency Provider Last Rate Last Admin  . prochlorperazine (COMPAZINE) injection 10  mg  10 mg Intravenous Q6H PRN Gardenia Phlegm, NP   10 mg at 12/26/13 7416    Allergies  Allergen Reactions  . Adhesive [Tape] Itching    Burned and scarred skinned  . Other     Cholroprep-causes rash  . Nickel Rash    ROS Review of Systems  Constitutional: Negative for chills, fatigue, fever and unexpected weight change.  Eyes: Negative for visual disturbance.  Respiratory: Negative for cough, chest tightness, shortness of breath and wheezing.   Cardiovascular: Negative for chest pain, palpitations and leg swelling.  Genitourinary: Negative for dysuria.  Neurological: Positive for headaches. Negative for dizziness, seizures, syncope, weakness and light-headedness.      Objective:    Physical Exam Vitals reviewed.  Constitutional:      General: She is not in acute distress.    Appearance: Normal appearance. She is not ill-appearing or toxic-appearing.  Eyes:     Extraocular Movements: Extraocular movements intact.     Pupils: Pupils are equal, round, and reactive to light.  Cardiovascular:     Rate and Rhythm: Normal rate and regular rhythm.  Pulmonary:     Effort: Pulmonary effort is normal.     Breath sounds: Normal breath sounds.  Musculoskeletal:     Cervical back: Neck supple.  Neurological:     Mental Status: She is alert.     BP (!) 162/98 (BP Location: Left Arm, Patient Position: Sitting, Cuff Size: Normal)   Pulse 89   Temp 98.6 F (37 C)   Wt 179 lb (81.2 kg)   LMP 09/28/2013 (Approximate)   SpO2 97%   BMI 30.73 kg/m  Wt Readings from Last 3 Encounters:  11/07/20 179 lb (81.2 kg)  11/07/20 189 lb (85.7 kg)  11/03/20 183 lb  3.2 oz (83.1 kg)     Health Maintenance Due  Topic Date Due  . FOOT EXAM  Never done  . COVID-19 Vaccine (3 - Pfizer risk 4-dose series) 11/30/2019  . HEMOGLOBIN A1C  03/07/2020    There are no preventive care reminders to display for this patient.  Lab Results  Component Value Date   TSH 1.189 10/07/2008    Lab Results  Component Value Date   WBC 3.5 (L) 11/03/2020   HGB 13.3 11/03/2020   HCT 40.3 11/03/2020   MCV 91.4 11/03/2020   PLT 274 11/03/2020   Lab Results  Component Value Date   NA 139 11/03/2020   K 4.0 11/03/2020   CHLORIDE 107 04/23/2016   CO2 32 11/03/2020   GLUCOSE 78 11/03/2020   BUN 15 11/03/2020   CREATININE 1.10 (H) 11/03/2020   BILITOT 0.4 11/03/2020   ALKPHOS 54 10/11/2016   AST 26 11/03/2020   ALT 21 11/03/2020   PROT 8.0 11/03/2020   ALBUMIN 4.3 10/11/2016   CALCIUM 9.8 11/03/2020   ANIONGAP 9 04/23/2016   EGFR 79 (L) 04/23/2016   GFR 63.97 09/08/2019   Lab Results  Component Value Date   CHOL 262 (H) 09/08/2019   Lab Results  Component Value Date   HDL 63.80 09/08/2019   Lab Results  Component Value Date   LDLCALC 159 (H) 09/08/2019   Lab Results  Component Value Date   TRIG 199.0 (H) 09/08/2019   Lab Results  Component Value Date   CHOLHDL 4 09/08/2019   Lab Results  Component Value Date   HGBA1C 6.2 09/08/2019      Assessment & Plan:   #1 severe hypertension.  Patient has had multiple elevated readings recently including today 182/108.   -Discussed DASH diet with handout given -Try to keep dietary sodium less than 2400 mg daily -Start back amlodipine 5 mg daily in addition to her lisinopril HCTZ -Already has office follow-up scheduled next week with her primary reassess  #2 predominately right-sided headaches past couple weeks.  Diagnosed previously as cervicogenic headaches.  Similar pattern.  Patient already on gabapentin.  Needs to avoid nonsteroidals with her lupus. -We also discussed potential for analgesic withdrawal headache and try and avoid daily regular analgesics -Refilled her Imitrex -If headache not improving with the above and with better blood pressure control consider possible short course of prednisone  Meds ordered this encounter  Medications  . DISCONTD: SUMAtriptan (IMITREX) 25 MG tablet    Sig: Take 1  tablet (25 mg total) by mouth every 2 (two) hours as needed for migraine. May repeat in 2 hours if needed. Do not take more then 2 tablets in 24 hours.    Dispense:  10 tablet    Refill:  0  . amLODipine (NORVASC) 5 MG tablet    Sig: Take 1 tablet (5 mg total) by mouth daily.    Dispense:  30 tablet    Refill:  5  . SUMAtriptan (IMITREX) 25 MG tablet    Sig: Take 1 tablet (25 mg total) by mouth every 2 (two) hours as needed for migraine. May repeat in 2 hours if needed. Do not take more then 2 tablets in 24 hours.    Dispense:  10 tablet    Refill:  0    Follow-up: No follow-ups on file.    Carolann Littler, MD

## 2020-11-08 ENCOUNTER — Telehealth: Payer: Self-pay | Admitting: *Deleted

## 2020-11-08 NOTE — Telephone Encounter (Signed)
CSW attempted to reach pt today for initial outreach call and was unable to reach. CSW was able to leave a HIPPA compliant voice mail message and will await callback or try again in 3-4 business days.   Eduard Clos, MSW, [place] Worker  [place] [phone]

## 2020-11-09 ENCOUNTER — Telehealth: Payer: Self-pay | Admitting: *Deleted

## 2020-11-09 NOTE — Telephone Encounter (Signed)
  Chronic Care Management   Outreach Note  11/09/2020 Name: Tina [name] MRN: [id number] DOB: 04/01/1961  Referred by: [place], Betty G, MD Reason for referral : No chief complaint on file.  A second unsuccessful telephone outreach was attempted today. The patient was referred to the case management team for assistance with care management and care coordination.   Follow Up Plan: The care management team will reach out to the patient again over the next 3-4 business days days.   Eduard Clos MSW, LCSW Licensed Clinical Social Worker [phone]

## 2020-11-10 ENCOUNTER — Ambulatory Visit: Payer: BC Managed Care – PPO

## 2020-11-10 ENCOUNTER — Telehealth: Payer: Self-pay | Admitting: Family Medicine

## 2020-11-10 NOTE — Telephone Encounter (Signed)
The patient seen Dr. Elease Hashimoto on 11/07/2020 and he prescribed her medication and was told to let Dr. Elease Hashimoto know how the medication was working in a couple days. She wanted to let Dr. Elease Hashimoto know that her BP has came down to normal range and the headache is completely gone.  She wanted to thank Dr. Elease Hashimoto so much for helping her.

## 2020-11-11 ENCOUNTER — Telehealth: Payer: Self-pay | Admitting: *Deleted

## 2020-11-11 ENCOUNTER — Telehealth: Payer: BC Managed Care – PPO

## 2020-11-11 NOTE — Telephone Encounter (Signed)
  Chronic Care Management   Outreach Note  11/11/2020 Name: Tina [name] MRN: [id number] DOB: 02/05/1961  Referred by: Martinique, Betty G, MD Reason for referral : Chronic Care Management (3rd Unsuccessful Outreach Call Attempt - Generalized Anxiety Disorder; Type II Diabetes Mellitus)  A third unsuccessful telephone outreach was attempted today.  A HIPAA compliant message was left on voicemail for patient, as LCSW continues to await a return call.  The patient was referred to the case management team for assistance with care management and care coordination. The patient's primary care provider has been notified of our unsuccessful attempts to make or maintain contact with the patient. The care management team is pleased to engage with this patient at any time in the future should he/she be interested in assistance from the care management team.   Follow Up Plan: No further follow up required.  Nat Christen LCSW Licensed Clinical Social Worker [place] [phone]

## 2020-11-13 ENCOUNTER — Other Ambulatory Visit: Payer: Self-pay | Admitting: Family Medicine

## 2020-11-13 DIAGNOSIS — I10 Essential (primary) hypertension: Secondary | ICD-10-CM

## 2020-11-13 NOTE — Telephone Encounter (Signed)
Let her know we really appreciate the feedback and glad she is feeling better.

## 2020-11-14 NOTE — Telephone Encounter (Signed)
Patient informed of the message below.

## 2020-11-15 ENCOUNTER — Ambulatory Visit: Payer: BC Managed Care – PPO | Admitting: Rheumatology

## 2020-11-16 ENCOUNTER — Emergency Department (HOSPITAL_COMMUNITY): Payer: BC Managed Care – PPO

## 2020-11-16 ENCOUNTER — Emergency Department (HOSPITAL_COMMUNITY)
Admission: EM | Admit: 2020-11-16 | Discharge: 2020-11-16 | Disposition: A | Discharge status: Home / Self Care | Payer: BC Managed Care – PPO | Attending: Emergency Medicine | Admitting: Emergency Medicine

## 2020-11-16 ENCOUNTER — Other Ambulatory Visit: Payer: Self-pay

## 2020-11-16 ENCOUNTER — Encounter: Payer: BC Managed Care – PPO | Admitting: Family Medicine

## 2020-11-16 ENCOUNTER — Encounter (HOSPITAL_COMMUNITY): Payer: Self-pay | Admitting: Emergency Medicine

## 2020-11-16 DIAGNOSIS — Z7982 Long term (current) use of aspirin: Secondary | ICD-10-CM | POA: Insufficient documentation

## 2020-11-16 DIAGNOSIS — R519 Headache, unspecified: Secondary | ICD-10-CM | POA: Diagnosis not present

## 2020-11-16 DIAGNOSIS — Z853 Personal history of malignant neoplasm of breast: Secondary | ICD-10-CM | POA: Insufficient documentation

## 2020-11-16 DIAGNOSIS — E119 Type 2 diabetes mellitus without complications: Secondary | ICD-10-CM | POA: Diagnosis not present

## 2020-11-16 DIAGNOSIS — Z79899 Other long term (current) drug therapy: Secondary | ICD-10-CM | POA: Diagnosis not present

## 2020-11-16 DIAGNOSIS — Z87891 Personal history of nicotine dependence: Secondary | ICD-10-CM | POA: Insufficient documentation

## 2020-11-16 DIAGNOSIS — I10 Essential (primary) hypertension: Secondary | ICD-10-CM | POA: Diagnosis not present

## 2020-11-16 DIAGNOSIS — G43809 Other migraine, not intractable, without status migrainosus: Secondary | ICD-10-CM

## 2020-11-16 MED ORDER — ONDANSETRON 4 MG PO TBDP: 4.0000 mg | ORAL_TABLET | Freq: Three times a day (TID) | ORAL | 0 refills | Status: DC | PRN

## 2020-11-16 MED ORDER — KETOROLAC TROMETHAMINE 15 MG/ML IJ SOLN
15.0000 mg | Freq: Once | INTRAMUSCULAR | Status: AC
  Administered 2020-11-16: 15 mg via INTRAVENOUS
  Filled 2020-11-16: qty 1

## 2020-11-16 MED ORDER — PROCHLORPERAZINE EDISYLATE 10 MG/2ML IJ SOLN
10.0000 mg | Freq: Once | INTRAMUSCULAR | Status: AC
  Administered 2020-11-16: 10 mg via INTRAVENOUS
  Filled 2020-11-16: qty 2

## 2020-11-16 MED ORDER — DIPHENHYDRAMINE HCL 50 MG/ML IJ SOLN
25.0000 mg | Freq: Once | INTRAMUSCULAR | Status: AC
  Administered 2020-11-16: 25 mg via INTRAVENOUS
  Filled 2020-11-16: qty 1

## 2020-11-16 MED ORDER — SODIUM CHLORIDE 0.9 % IV BOLUS
1000.0000 mL | Freq: Once | INTRAVENOUS | Status: AC
  Administered 2020-11-16: 1000 mL via INTRAVENOUS

## 2020-11-16 NOTE — ED Triage Notes (Signed)
Patient complains of headache x1 month, is following this w/ neurology. Complains of N/V since last night, has not been able to take any of her daily medications, has not tried anything for nausea. Unable to get in w/ neurology for a F/U.

## 2020-11-16 NOTE — ED Provider Notes (Signed)
[facility] DEPT Provider Note   CSN: [id number] Arrival date & time: 11/16/20  1779     History Chief Complaint  Patient presents with  . Headache  . Emesis    Tina [name] is a 60 y.o. female past ministry of anxiety, breast cancer, fibromyalgia, migraines, neuropathy who presents for evaluation of headache.  She states over the last month, she has had intermittent right-sided headaches.  She she has pretty much had them every day and feels like they are progressive worsening.  She will take Tylenol as well as sumatriptan and states it will ease off and improve but then they returned.  She had school called her neurologist to schedule an appointment with her but was unable to get anything soon so she came to the emergency department.  She reports that several years ago, she was having pain to the right side of her head and she saw a neurologist.  She was prescribed sumatriptan but states she has rarely ever had to use it she does not frequently get headaches.  She states that the headaches that she has been having for the last month feels similar to her previous and are mainly on the right side.  She sometimes it is a throbbing and sometimes it is a dull ache.  Currently rates it as a 4/10.  Last night, she started experiencing nausea/vomiting which is what caused her to come to the emergency department today.  She has not had any fever, blurry vision, vision changes.  She denies any numbness/weakness of her arms or legs, photophobia, shortness of breath.  The history is provided by the patient.       Past Medical History:  Diagnosis Date  . Anxiety   . Breast cancer ([place]) 2015   Right Breast Cancer  . Cancer of right breast ([place]) 09/03/13   Invasive Ductal Carcinoma/Ductal Carcinoma Insitu  biopsies  . Chronic low back pain 01/24/2016   -and R hip pain -seen by rheumatologist and ortho in the past -MRI with Dr. Lorin Mercy in 2015, R hip plain films in 2015 as  well acupuncture, injections, tramadol not helpful [facility] and PT helpful   . Degenerative joint disease    back neck and shoulder  . Fibromyalgia    Dr. Estanislado Pandy  . GERD (gastroesophageal reflux disease)    hx esophagitis  . Hot flashes   . Hypertension   . Leukopenia 06/16/2013  . Lupus [facility])    rheumatologist - Dr. Estanislado Pandy  . Migraines   . Neuromuscular disorder ([place])    neuropathy from chemo in feet/hands  . Personal history of chemotherapy    2015  . Personal history of radiation therapy 2015   Right Breast Cancer  . S/P radiation therapy 04/27/2014-06/07/2014   1) Right breast, 50 Gy in 25 fractions/ 2) Right breast boost, 10 Gy in 5 fractions    Patient Active Problem List   Diagnosis Date Noted  . Controlled type 2 diabetes mellitus with complication, without long-term current use of insulin ([place]) 05/07/2019  . Hyperlipidemia 05/05/2019  . Obesity (BMI 30-39.9) 05/05/2019  . Fibromyalgia 02/28/2016  . Chronic low back pain 01/24/2016  . Lupus ([place]) 04/19/2015  . GERD (gastroesophageal reflux disease) 04/19/2015  . Cervicogenic headache 02/10/2015  . Neck pain 02/10/2015  . Hot flashes 10/26/2014  . GAD (generalized anxiety disorder) 10/26/2014  . Peripheral neuropathy due to chemotherapy ([place]) 03/12/2014  . Breast cancer of lower-outer quadrant of right female breast (  [place]) 09/15/2013  . Left shoulder pain - seeing Dr. Lorin Mercy 08/20/2013  . Hypertension     Past Surgical History:  Procedure Laterality Date  . BREAST BIOPSY Left 2015   benign core  . BREAST BIOPSY Right 2015   malignant core   . BREAST LUMPECTOMY Right 2015  . BREAST SURGERY  03/01/2014   hx right breast cancer- 3 lymph nodes removed and lumpectomy  . [place]   rt  . CESAREAN SECTION     x 3  . PORTACATH PLACEMENT  08/2013   still currently active   . ROBOTIC ASSISTED BILATERAL SALPINGO OOPHERECTOMY Bilateral 07/09/2014   Procedure: ROBOTIC  ASSISTED BILATERAL SALPINGO OOPHORECTOM; :LYSIS OF ADHESIONS;  Surgeon: Marvene Staff, MD;  Location: [facility];  Service: Gynecology;  Laterality: Bilateral;  . TRIGGER FINGER RELEASE  2004   rt thumb  . trigger finger release left thumb  06/28/14   at [facility]opedic surgical center  . TUBAL LIGATION    . WISDOM TOOTH EXTRACTION       OB History    Gravida  3   Para  3   Term      Preterm      AB      Living        SAB      IAB      Ectopic      Multiple      Live Births           Obstetric Comments   Menses age 28, Parity age 58, G65,P3, BC x 5 years, No HRT        Family History  Problem Relation Age of Onset  . Hypertension Mother   . Dementia Mother        small vessel disease  . Cancer Mother 30       cervical  cancer  . Cancer Father 24       prostate ca  . Diabetes Father   . Arthritis Father   . Cancer Half-Sister   . Breast cancer Neg Hx     Social History   Tobacco Use  . Smoking status: Former Smoker    Packs/day: 1.00    Years: 15.00    Pack years: 15.00    Types: Cigars, Cigarettes    Quit date: 02/23/2004    Years since quitting: 16.7  . Smokeless tobacco: Never Used  . Tobacco comment: remote smoking history  Vaping Use  . Vaping Use: Never used  Substance Use Topics  . Alcohol use: Yes    Alcohol/week: 0.0 standard drinks    Comment: rare  . Drug use: Yes    Types: Marijuana    Comment: Marijuana as needed     Home Medications Prior to Admission medications   Medication Sig Start Date End Date Taking? Authorizing Provider  ondansetron (ZOFRAN ODT) 4 MG disintegrating tablet Take 1 tablet (4 mg total) by mouth every 8 (eight) hours as needed for nausea or vomiting. 11/16/20  Yes Providence Lanius A, PA-C  amLODipine (NORVASC) 5 MG tablet Take 1 tablet (5 mg total) by mouth daily. 11/07/20   Burchette, Alinda Sierras, MD  aspirin 81 MG tablet Take 81 mg by mouth daily.    [provider]  b complex vitamins capsule Take 1  capsule by mouth daily.    [provider]  BIOTIN 5000 PO Take 1 capsule by mouth. Takes sometimes    [provider]  Calcium  Carbonate-Vit D-Min (CALTRATE 600+D PLUS MINERALS PO) Take 1 tablet by mouth daily.    [provider]  gabapentin (NEURONTIN) 600 MG tablet Take 1 tablet (600 mg total) by mouth 3 (three) times daily. 12/29/19   [name], MD  hydroxychloroquine (PLAQUENIL) 200 MG tablet Take 1 tablet by mouth once daily 10/13/20   Ofilia Neas, PA-C  lisinopril-hydrochlorothiazide (ZESTORETIC) 20-12.5 MG tablet TAKE 1 TABLET BY MOUTH ONCE DAILY . APPOINTMENT REQUIRED FOR FUTURE REFILLS 11/14/20   [place]ique, Betty G, MD  loratadine (CLARITIN) 10 MG tablet Take 10 mg by mouth as needed.    [provider]  MAGNESIUM PO Take by mouth daily.    [provider]  omeprazole (PRILOSEC) 40 MG capsule TAKE 1 CAPSULE BY MOUTH ONCE DAILY BEFORE BREAKFAST 10/14/20   [place], Betty G, MD  Propylene Glycol (SYSTANE COMPLETE OP) Apply to eye.    [provider]  SUMAtriptan (IMITREX) 25 MG tablet Take 1 tablet (25 mg total) by mouth every 2 (two) hours as needed for migraine. May repeat in 2 hours if needed. Do not take more then 2 tablets in 24 hours. 11/07/20   Burchette, Alinda Sierras, MD  TURMERIC PO Take by mouth daily.    [provider]  venlafaxine XR (EFFEXOR-XR) 150 MG 24 hr capsule Take 1 capsule by mouth once daily 10/14/20   Martinique, Betty G, MD    Allergies    Adhesive [tape], Other, and Nickel  Review of Systems   Review of Systems  Constitutional: Negative for fever.  Eyes: Negative for photophobia and visual disturbance.  Gastrointestinal: Positive for nausea and vomiting.  Neurological: Positive for headaches. Negative for weakness and numbness.  All other systems reviewed and are negative.   Physical Exam Updated Vital Signs BP 120/78   Pulse 91   Temp 98.4 F (36.9 C) (Oral)   Resp 15   Ht 5\' 4"  (1.626 m)   Wt 82  kg   LMP 09/28/2013 (Approximate)   SpO2 98%   BMI 31.03 kg/m   Physical Exam Vitals and nursing note reviewed.  Constitutional:      Appearance: Normal appearance. She is well-developed.  HENT:     Head: Normocephalic and atraumatic.  Eyes:     General: Lids are normal.     Conjunctiva/sclera: Conjunctivae normal.     Pupils: Pupils are equal, round, and reactive to light.     Comments: PERRL. EOMs intact. No nystagmus. No neglect.   Neck:     Comments: Neck is supple and without rigidity.  Cardiovascular:     Rate and Rhythm: Normal rate and regular rhythm.     Pulses: Normal pulses.     Heart sounds: Normal heart sounds. No murmur heard. No friction rub. No gallop.   Pulmonary:     Effort: Pulmonary effort is normal.     Breath sounds: Normal breath sounds.  Abdominal:     Palpations: Abdomen is soft. Abdomen is not rigid.     Tenderness: There is no abdominal tenderness. There is no guarding.  Musculoskeletal:        General: Normal range of motion.     Cervical back: Full passive range of motion without pain.  Skin:    General: Skin is warm and dry.     Capillary Refill: Capillary refill takes less than 2 seconds.  Neurological:     Mental Status: She is alert and oriented to person, place, and time.  Comments: Cranial nerves III-XII intact Follows commands, Moves all extremities  5/5 strength to BUE and BLE  Sensation intact throughout all major nerve distributions No pronator drift.  No slurred speech. No facial droop.   Psychiatric:        Speech: Speech normal.     ED Results / Procedures / Treatments   Labs (all labs ordered are listed, but only abnormal results are displayed) Labs Reviewed - No data to display  EKG None  Radiology CT Head Wo Contrast  Result Date: 11/16/2020 CLINICAL DATA:  Headache, new or worsening. Additional history provided: Patient reports headache for 1 month, history of hypertension, nausea/vomiting since last night.  EXAM: CT HEAD WITHOUT CONTRAST TECHNIQUE: Contiguous axial images were obtained from the base of the skull through the vertex without intravenous contrast. COMPARISON:  MRI trigeminal nerve protocol 02/27/2018. MRI brain 02/06/2015. FINDINGS: Brain: Mild generalized cerebral atrophy. There is no acute intracranial hemorrhage. No demarcated cortical infarct. No extra-axial fluid collection. No evidence of intracranial mass. No midline shift. Vascular: No hyperdense vessel.  Atherosclerotic calcifications. Skull: Normal. Negative for fracture or focal lesion. Sinuses/Orbits: Visualized orbits show no acute finding. No significant paranasal sinus disease at the imaged levels IMPRESSION: No evidence of acute intracranial abnormality. Mild generalized cerebral atrophy, stable as compared to the MRI of 02/27/2018. Electronically Signed   By: Kellie Simmering DO   On: 11/16/2020 11:02    Procedures Procedures   Medications Ordered in ED Medications  sodium chloride 0.9 % bolus 1,000 mL (0 mLs Intravenous Stopped 11/16/20 1433)  prochlorperazine (COMPAZINE) injection 10 mg (10 mg Intravenous Given 11/16/20 1037)  diphenhydrAMINE (BENADRYL) injection 25 mg (25 mg Intravenous Given 11/16/20 1037)  ketorolac (TORADOL) 15 MG/ML injection 15 mg (15 mg Intravenous Given 11/16/20 1309)    ED Course  I have reviewed the triage vital signs and the nursing notes.  Pertinent labs & imaging results that were available during my care of the patient were reviewed by me and considered in my medical decision making (see chart for details).    MDM Rules/Calculators/A&P                          60 y.o. F with PMH/o anxiety, fibromyalgia, h/o breast cancer, migraines who presents for evaluation of headaches that have been ongoing for 1 month. Started having nausea/vomiting last night. No fevers, weakness, vision changes. H/o right sided migraines several years ago and had sumatriptan, which has not been helping.  On initial  arrival, she is afebrile, nontoxic-appearing.  Vital signs are stable.  On my exam, her neck is supple and without rigidity.  History/physical exam not concerning for meningitis.  She has no neurodeficits noted.  History/physical exam is not concerning for CVA.  Suspect this is migraine.  She reports a history of right-sided migraine and states that this feels similar.  We will plan to give her a migraine cocktail here in the ED.  Additionally, given her history of breast cancer as well as the fact that she states this been continuous for a month, will obtain imaging of her head to ensure no intracranial abnormality causing her symptoms.  CT head shows no acute intracranial abnormality.   Reevaluation.  Patient reports improvement after migraine cocktail.  States headache is now 2/10.  We will plan to give additional analgesics and p.o. challenge.  Patient able tolerate p.o. without any difficulty.  She reports headache is completely resolved.  Vital signs are  stable.  At this time, I suspect her symptoms are likely related to migraines.  I discussed with her regarding following up with neurology.  She has called them and has an appointment in the next several weeks. At this time, patient exhibits no emergent life-threatening condition that require further evaluation in ED. Patient had ample opportunity for questions and discussion. All patient's questions were answered with full understanding. Strict return precautions discussed. Patient expresses understanding and agreement to plan.   Portions of this note were generated with [employer]. Dictation errors may occur despite best attempts at proofreading.  Final Clinical Impression(s) / ED Diagnoses Final diagnoses:  Other migraine without status migrainosus, not intractable    Rx / DC Orders ED Discharge Orders         Ordered    Ambulatory referral to Neurology       Comments: An appointment is requested in approximately: 2 weeks    11/16/20 1347    ondansetron (ZOFRAN ODT) 4 MG disintegrating tablet  Every 8 hours PRN        11/16/20 1347           Desma Mcgregor 11/16/20 1603    Luna Fuse, MD 11/18/20 1610

## 2020-11-16 NOTE — Discharge Instructions (Signed)
Take Zofran as needed for nausea/vomiting.  Follow-up with your neurologist.  I have put in a referral.  Call their office to arrange an appointment.  Return to the emergency department for any worsening headache, nausea/vomiting, difficulty walking, numbness/weakness of your arms or legs or any other worsening or concerning symptoms.

## 2020-11-18 ENCOUNTER — Encounter: Payer: Self-pay | Admitting: Neurology

## 2020-11-21 ENCOUNTER — Telehealth: Payer: Self-pay | Admitting: Family Medicine

## 2020-11-21 NOTE — Telephone Encounter (Signed)
Patient stated the hospital prescribed her ondansetron (ZOFRAN ODT) 4 MG disintegrating tablet but she is out and wanted to see if the provider can refill medication and send it to  [facility] ([place] [place] - [address] [facility]) [place] [address]  Phone:  [phone] Fax:  [phone]  CB is [phone]

## 2020-11-21 NOTE — Telephone Encounter (Signed)
Okay to refill x 1? Pt has rescheduled her physical.

## 2020-11-21 NOTE — Telephone Encounter (Signed)
Patient has appointment tomorrow morning with pcp.

## 2020-11-22 ENCOUNTER — Other Ambulatory Visit (INDEPENDENT_AMBULATORY_CARE_PROVIDER_SITE_OTHER): Payer: BC Managed Care – PPO

## 2020-11-22 ENCOUNTER — Telehealth (INDEPENDENT_AMBULATORY_CARE_PROVIDER_SITE_OTHER): Payer: BC Managed Care – PPO | Admitting: Family Medicine

## 2020-11-22 ENCOUNTER — Encounter: Payer: Self-pay | Admitting: Family Medicine

## 2020-11-22 ENCOUNTER — Other Ambulatory Visit: Payer: Self-pay

## 2020-11-22 ENCOUNTER — Other Ambulatory Visit: Payer: Self-pay | Admitting: Family Medicine

## 2020-11-22 ENCOUNTER — Ambulatory Visit (INDEPENDENT_AMBULATORY_CARE_PROVIDER_SITE_OTHER)
Admission: RE | Admit: 2020-11-22 | Discharge: 2020-11-22 | Disposition: A | Discharge status: Home / Self Care | Payer: BC Managed Care – PPO | Source: Ambulatory Visit | Attending: Family Medicine | Admitting: Family Medicine

## 2020-11-22 VITALS — BP 99/77 | HR 90 | Ht 64.0 in | Wt 168.1 lb

## 2020-11-22 DIAGNOSIS — R062 Wheezing: Secondary | ICD-10-CM | POA: Diagnosis not present

## 2020-11-22 DIAGNOSIS — J989 Respiratory disorder, unspecified: Secondary | ICD-10-CM | POA: Diagnosis not present

## 2020-11-22 DIAGNOSIS — E876 Hypokalemia: Secondary | ICD-10-CM

## 2020-11-22 DIAGNOSIS — R059 Cough, unspecified: Secondary | ICD-10-CM | POA: Diagnosis not present

## 2020-11-22 DIAGNOSIS — K921 Melena: Secondary | ICD-10-CM

## 2020-11-22 DIAGNOSIS — I959 Hypotension, unspecified: Secondary | ICD-10-CM | POA: Diagnosis not present

## 2020-11-22 DIAGNOSIS — R112 Nausea with vomiting, unspecified: Secondary | ICD-10-CM | POA: Diagnosis not present

## 2020-11-22 LAB — CBC WITH DIFFERENTIAL/PLATELET
Basophils Absolute: 0 10*3/uL (ref 0.0–0.1)
Basophils Relative: 0.9 % (ref 0.0–3.0)
Eosinophils Absolute: 0 10*3/uL (ref 0.0–0.7)
Eosinophils Relative: 0.3 % (ref 0.0–5.0)
HCT: 42.6 % (ref 36.0–46.0)
Hemoglobin: 14.6 g/dL (ref 12.0–15.0)
Lymphocytes Relative: 45.3 % (ref 12.0–46.0)
Lymphs Abs: 1.4 10*3/uL (ref 0.7–4.0)
MCHC: 34.4 g/dL (ref 30.0–36.0)
MCV: 89.1 fl (ref 78.0–100.0)
Monocytes Absolute: 0.2 10*3/uL (ref 0.1–1.0)
Monocytes Relative: 7.9 % (ref 3.0–12.0)
Neutro Abs: 1.4 10*3/uL (ref 1.4–7.7)
Neutrophils Relative %: 45.6 % (ref 43.0–77.0)
Platelets: 221 10*3/uL (ref 150.0–400.0)
RBC: 4.78 Mil/uL (ref 3.87–5.11)
RDW: 13.6 % (ref 11.5–15.5)
WBC: 3.1 10*3/uL — ABNORMAL LOW (ref 4.0–10.5)

## 2020-11-22 LAB — COMPREHENSIVE METABOLIC PANEL
ALT: 22 U/L (ref 0–35)
AST: 24 U/L (ref 0–37)
Albumin: 4.4 g/dL (ref 3.5–5.2)
Alkaline Phosphatase: 50 U/L (ref 39–117)
BUN: 21 mg/dL (ref 6–23)
CO2: 30 mEq/L (ref 19–32)
Calcium: 9.9 mg/dL (ref 8.4–10.5)
Chloride: 99 mEq/L (ref 96–112)
Creatinine, Ser: 1.18 mg/dL (ref 0.40–1.20)
GFR: 51.04 mL/min — ABNORMAL LOW (ref >60.00)
Glucose, Bld: 108 mg/dL — ABNORMAL HIGH (ref 70–99)
Potassium: 2.9 mEq/L — ABNORMAL LOW (ref 3.5–5.1)
Sodium: 138 mEq/L (ref 135–145)
Total Bilirubin: 0.9 mg/dL (ref 0.2–1.2)
Total Protein: 8.6 g/dL — ABNORMAL HIGH (ref 6.0–8.3)

## 2020-11-22 MED ORDER — POTASSIUM CHLORIDE CRYS ER 20 MEQ PO TBCR: EXTENDED_RELEASE_TABLET | ORAL | 0 refills | Status: DC

## 2020-11-22 MED ORDER — ONDANSETRON 4 MG PO TBDP: 4.0000 mg | ORAL_TABLET | Freq: Three times a day (TID) | ORAL | 0 refills | Status: AC | PRN

## 2020-11-22 MED ORDER — ALBUTEROL SULFATE HFA 108 (90 BASE) MCG/ACT IN AERS: 2.0000 | INHALATION_SPRAY | Freq: Four times a day (QID) | RESPIRATORY_TRACT | 0 refills | Status: AC | PRN

## 2020-11-22 NOTE — Progress Notes (Signed)
Scheduled for 11/29/2020 at 8:30 AM

## 2020-11-22 NOTE — Progress Notes (Addendum)
Virtual Visit via Video Note I connected with Tina [name] on 11/22/20 by a video enabled telemedicine application and verified that I am speaking with the correct person using two identifiers.  Location patient: home Location provider:work office Persons participating in the virtual visit: patient, provider  I discussed the limitations of evaluation and management by telemedicine and the availability of in person appointments. The patient expressed understanding and agreed to proceed.  Chief Complaint  Patient presents with  . stomach bug    X 7 days, seen in the ED on 4/20   HPI: Tina [name] is a 60 yo female with hx of DM 2, hyperlipidemia, hypertension, fibromyalgia, past history of breast cancer, and anxiety following on recent ER visit. She was evaluated in the ER on 11/16/20 because of headache. Head CT on 11/16/20:No evidence of acute intracranial abnormality. Mild generalized cerebral atrophy, stable as compared to the MRI of 02/27/2018. Headache has resolved.  -7 to 8 days of LUQ mild abdominal cramps, nausea, and vomiting. No prior Hx. Abdominal pain is intermittent, 4/10. No radiated. Nausea and vomiting exacerbated by oral intake. Today she has vomited once, yesterday 3 times. Zofran given in the ER helped.  Decreased urine output, she voided x2 yesterday, once today. Orange/dark urine, negative for hematuria, dysuria, or foam in urine.  Lab Results  Component Value Date   CREATININE 1.10 (H) 11/03/2020   BUN 15 11/03/2020   NA 139 11/03/2020   K 4.0 11/03/2020   CL 100 11/03/2020   CO2 32 11/03/2020   Reporting black heart stools that is started 3 days ago. She has not noted with bright blood in the stool. She has not noted heartburn. Burping when she tries to drink fluids.  GERD: She has not been on omeprazole since she been sick, tried yesterday and it seemed to worsen symptoms. Last colonoscopy on 01/02/2016, 10-year follow-up was recommended.  Lab  Results  Component Value Date   WBC 3.5 (L) 11/03/2020   HGB 13.3 11/03/2020   HCT 40.3 11/03/2020   MCV 91.4 11/03/2020   PLT 274 11/03/2020  BP has been low for the past couple days, 90s/70s. Currently she is on lisinopril-HCTZ 20-12.5 mg daily and amlodipine 5 mg. Last time she took BP medication was yesterday. Negative for fever.  "Very bad" productive cough with thick sputum and associated wheezing that is started about a week ago. Postnasal drainage.  Denies history of COPD. Reporting asthma-like symptoms with viral infections. + Former smoker.  Negative for sick contact or recent travel. COVID-19 reported as negative Monday. COVID-19 vaccination completed. Negative for anosmia or ageusia.  ROS: See pertinent positives and negatives per HPI.  Past Medical History:  Diagnosis Date  . Anxiety   . Breast cancer ([place]) 2015   Right Breast Cancer  . Cancer of right breast ([place]) 09/03/13   Invasive Ductal Carcinoma/Ductal Carcinoma Insitu  biopsies  . Chronic low back pain 01/24/2016   -and R hip pain -seen by rheumatologist and ortho in the past -MRI with Dr. Lorin Mercy in 2015, R hip plain films in 2015 as well acupuncture, injections, tramadol not helpful [facility] and [facility] helpful   . Degenerative joint disease    back neck and shoulder  . Fibromyalgia    Dr. Estanislado Pandy  . GERD (gastroesophageal reflux disease)    hx esophagitis  . Hot flashes   . Hypertension   . Leukopenia 06/16/2013  . Lupus [facility])    rheumatologist - Dr. Estanislado Pandy  .  Migraines   . 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 Personal history of chemotherapy    2015  . Personal history of radiation therapy 2015   Right Breast Cancer  . S/P radiation therapy 04/27/2014-06/07/2014   1) Right breast, 50 Gy in 25 fractions/ 2) Right breast boost, 10 Gy in 5 fractions    Past Surgical History:  Procedure Laterality Date  . BREAST BIOPSY Left 2015    benign core  . BREAST BIOPSY Right 2015   malignant core   . BREAST LUMPECTOMY Right 2015  . BREAST SURGERY  03/01/2014   hx right breast cancer- 3 lymph nodes removed and lumpectomy  . [place]   rt  . CESAREAN SECTION     x 3  . PORTACATH PLACEMENT  08/2013   still currently active   . ROBOTIC ASSISTED BILATERAL SALPINGO OOPHERECTOMY Bilateral 07/09/2014   Procedure: ROBOTIC ASSISTED BILATERAL SALPINGO OOPHORECTOM; :LYSIS OF ADHESIONS;  Surgeon: Marvene Staff, MD;  Location: [facility];  Service: Gynecology;  Laterality: Bilateral;  . TRIGGER FINGER RELEASE  2004   rt thumb  . trigger finger release left thumb  06/28/14   [facility]  . TUBAL LIGATION    . WISDOM TOOTH EXTRACTION      Family History  Problem Relation Age of Onset  . Hypertension Mother   . Dementia Mother        small vessel disease  . Cancer Mother 30       cervical  cancer  . Cancer Father 77       prostate ca  . Diabetes Father   . Arthritis Father   . Cancer Half-Sister   . Breast cancer Neg Hx     Social History   Socioeconomic History  . Marital status: Married    Spouse name: Not on file  . Number of children: 1  . Years of education: Not on file  . Highest education level: Not on file  Occupational History    Comment: homemaker  Tobacco Use  . Smoking status: Former Smoker    Packs/day: 1.00    Years: 15.00    Pack years: 15.00    Types: Cigars, Cigarettes    Quit date: 02/23/2004    Years since quitting: 16.7  . Smokeless tobacco: Never Used  . Tobacco comment: remote smoking history  Vaping Use  . Vaping Use: Never used  Substance and Sexual Activity  . Alcohol use: Yes    Alcohol/week: 0.0 standard drinks    Comment: rare  . Drug use: Yes    Types: Marijuana    Comment: Marijuana as needed   . Sexual activity: Yes  Other Topics Concern  . Not on file  Social History Narrative   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      Home Situation:  lives with husband and 58 yo son      Spiritual Beliefs: Christian      Lifestyle: elliptical 3-4 times per week; working on diet as well over last year in 2014            Social Determinants of Health   Financial Resource Strain: Low Risk   . Difficulty of Paying Living Expenses: Not hard at all  Food Insecurity: No Food Insecurity  . Worried About Charity fundraiser in the Last Year: Never true  . Ran Out of Food in the Last Year: Never true  Transportation Needs: No Transportation Needs  . Lack of Transportation (Medical): No  . Lack of Transportation (Non-Medical): No  Physical Activity: Sufficiently Active  . Days of Exercise per Week: 5 days  . Minutes of Exercise per Session: 30 min  Stress: Stress Concern Present  . Feeling of Stress : To some extent  Social Connections: Socially Integrated  . Frequency of Communication with Friends and Family: More than three times a week  . Frequency of Social Gatherings with Friends and Family: More than three times a week  . Attends Religious Services: More than 4 times per year  . Active Member of Clubs or Organizations: Yes  . Attends Archivist Meetings: 1 to 4 times per year  . Marital Status: Married  Human resources officer Violence: Not At Risk  . Fear of Current or Ex-Partner: No  . Emotionally Abused: No  . Physically Abused: No  . Sexually Abused: No   Current Outpatient Medications:  .  amLODipine (NORVASC) 5 MG tablet, Take 1 tablet (5 mg total) by mouth daily., Disp: 30 tablet, Rfl: 5 .  aspirin 81 MG tablet, Take 81 mg by mouth daily., Disp: , Rfl:  .  b complex vitamins capsule, Take 1 capsule by mouth daily., Disp: , Rfl:  .  BIOTIN 5000 PO, Take 1 capsule by mouth. Takes sometimes, Disp: , Rfl:  .  Calcium Carbonate-Vit D-Min (CALTRATE 600+D PLUS MINERALS PO), Take 1 tablet by mouth daily., Disp: , Rfl:  .  gabapentin (NEURONTIN) 600 MG tablet, Take 1 tablet (600 mg total) by mouth 3 (three) times daily.,  Disp: 90 tablet, Rfl: 6 .  hydroxychloroquine (PLAQUENIL) 200 MG tablet, Take 1 tablet by mouth once daily, Disp: 90 tablet, Rfl: 0 .  lisinopril-hydrochlorothiazide (ZESTORETIC) 20-12.5 MG tablet, TAKE 1 TABLET BY MOUTH ONCE DAILY . APPOINTMENT REQUIRED FOR FUTURE REFILLS, Disp: 30 tablet, Rfl: 0 .  loratadine (CLARITIN) 10 MG tablet, Take 10 mg by mouth as needed., Disp: , Rfl:  .  MAGNESIUM PO, Take by mouth daily., Disp: , Rfl:  .  omeprazole (PRILOSEC) 40 MG capsule, TAKE 1 CAPSULE BY MOUTH ONCE DAILY BEFORE BREAKFAST, Disp: 90 capsule, Rfl: 0 .  ondansetron (ZOFRAN ODT) 4 MG disintegrating tablet, Take 1 tablet (4 mg total) by mouth every 8 (eight) hours as needed for nausea or vomiting., Disp: 6 tablet, Rfl: 0 .  Propylene Glycol (SYSTANE COMPLETE OP), Apply to eye., Disp: , Rfl:  .  SUMAtriptan (IMITREX) 25 MG tablet, Take 1 tablet (25 mg total) by mouth every 2 (two) hours as needed for migraine. May repeat in 2 hours if needed. Do not take more then 2 tablets in 24 hours., Disp: 10 tablet, Rfl: 0 .  TURMERIC PO, Take by mouth daily., Disp: , Rfl:  .  venlafaxine XR (EFFEXOR-XR) 150 MG 24 hr capsule, Take 1 capsule by mouth once daily, Disp: 90 capsule, Rfl: 0 No current facility-administered medications for this visit.  Facility-Administered Medications Ordered in Other Visits:  .  prochlorperazine (COMPAZINE) injection 10 mg, 10 mg, Intravenous, Q6H PRN, Delice Bison, Charlestine Massed, NP, 10 mg at 12/26/13 0917  EXAM:  VITALS per patient if applicable:BP 78/29   Pulse 90   Ht 5\' 4"  (1.626 m)   Wt 168 lb 2 oz (76.3 kg)   LMP 09/28/2013 (Approximate)   BMI 28.86 kg/m   GENERAL: alert, oriented, appears well and in no acute distress  HEENT: atraumatic, conjunctiva clear, no obvious abnormalities on inspection.  NECK: normal  movements of the head and neck  LUNGS: on inspection no signs of respiratory distress, breathing rate appears normal, no obvious gross SOB, gasping or wheezing.  Productive cough x 1.  CV: no obvious cyanosis  MS: moves all visible extremities without noticeable abnormality  PSYCH/NEURO: pleasant and cooperative, no obvious depression or anxiety, speech and thought processing grossly intact  ASSESSMENT AND PLAN:  Discussed the following assessment and plan:  Nausea and vomiting in adult - Plan: Comprehensive metabolic panel We discussed possible etiologies. Adequate hydration, a small sips frequently throughout the day. Continue monitoring urine output. Zofran ODT 4 mg 3 times daily as needed.  For now we will hold on imaging. Gall bladder disease to be considered.  Clearly instructed about warning signs.  Black stools - Plan: Ambulatory referral to Gastroenterology, CBC with Differential/Platelet ? PUD. No changes in Aspirin 81 mg for now, will make recommendations after CBC result is back. Recommend trying to resume omeprazole 40 mg daily. GI referral placed. Further recommendations according to lab results. Clearly instructed about warning signs.  Hypotension, unspecified hypotension type Instructed to hold antihypertensive for now. Continue monitoring BP regularly. Adequate hydration.  Reactive airway disease without asthma - Plan: DG Chest 2 View ?  COPD. Albuterol inh 2 puff every 6 hours for a week then as needed for wheezing or shortness of breath.  She will go to [facility] for CXR and blood work today.  We discussed possible serious and likely etiologies, options for evaluation and workup, limitations of telemedicine visit vs in person visit, treatment, treatment risks and precautions.  I discussed the assessment and treatment plan with the patient. Ms. Constantino was provided an opportunity to ask questions and all were answered. She agreed with the plan and demonstrated an understanding of the instructions.  Return in about 1 week (around 11/29/2020).   Tina Franey Martinique, MD

## 2020-11-25 ENCOUNTER — Other Ambulatory Visit: Payer: BC Managed Care – PPO

## 2020-11-29 ENCOUNTER — Telehealth (INDEPENDENT_AMBULATORY_CARE_PROVIDER_SITE_OTHER): Payer: BC Managed Care – PPO | Admitting: Family Medicine

## 2020-11-29 ENCOUNTER — Telehealth: Payer: Self-pay | Admitting: Family Medicine

## 2020-11-29 ENCOUNTER — Ambulatory Visit: Payer: BC Managed Care – PPO | Admitting: Family Medicine

## 2020-11-29 ENCOUNTER — Encounter: Payer: Self-pay | Admitting: Family Medicine

## 2020-11-29 VITALS — BP 120/78 | Ht 64.0 in

## 2020-11-29 DIAGNOSIS — M359 Systemic involvement of connective tissue, unspecified: Secondary | ICD-10-CM

## 2020-11-29 DIAGNOSIS — U071 COVID-19: Secondary | ICD-10-CM | POA: Diagnosis not present

## 2020-11-29 DIAGNOSIS — T451X5A Adverse effect of antineoplastic and immunosuppressive drugs, initial encounter: Secondary | ICD-10-CM

## 2020-11-29 DIAGNOSIS — G620 Drug-induced polyneuropathy: Secondary | ICD-10-CM | POA: Diagnosis not present

## 2020-11-29 DIAGNOSIS — I10 Essential (primary) hypertension: Secondary | ICD-10-CM | POA: Diagnosis not present

## 2020-11-29 DIAGNOSIS — R232 Flushing: Secondary | ICD-10-CM

## 2020-11-29 NOTE — Telephone Encounter (Addendum)
I tried contacting patient, unable to leave a voicemail due to mailbox being full.

## 2020-11-29 NOTE — Telephone Encounter (Signed)
I spoke with patient. She tested positive for covid & wanted to know about the antiviral medication. Spoke with Dr. Martinique, she would like a VV to discuss medication with patient. Patient is scheduled for this afternoon at 4:30.

## 2020-11-29 NOTE — Telephone Encounter (Signed)
Patient is calling back and is requesting a call back regarding covid results and medication, please advise. CB is [phone]

## 2020-11-29 NOTE — Progress Notes (Addendum)
Virtual Visit via Video Note I connected with Tina [name] on 11/29/20 by a video enabled telemedicine application and verified that I am speaking with the correct person using two identifiers.  Location patient: home Location provider:work office Persons participating in the virtual visit: patient, provider  I discussed the limitations of evaluation and management by telemedicine and the availability of in person appointments. The patient expressed understanding and agreed to proceed.  Chief Complaint  Patient presents with  . Covid Positive   HPI: Tina [name] is a 60 yo female with hx of lupus,peripheral neuropathy,anxiety,GERD,and DM II following on last visit. Virtual visit on 11/22/20 for nausea,vomiting,and respiratory symptoms.  She repeated COVID 19 home test on 11/24/20 and this time it was positive. She went to have COVID 19 test repeated at [facility] and positive. Still having a "lingering" cough, all other symptoms have resolved. She is feeling "amaizing."  According to pt, she was recommended to have Evusheld infusion once all symptoms resolved. During acute illness period she stopped all her medications.  Lupus: She has not resumed Plaquenil 200 mg once daily. Follows with Dr. Kathi Ludwig. Peripheral neuropathy: She was on Gabapentin 600 mg tid, stopped 11/14/20. She has not noted symptoms so far. Hot flashes: For the same amount of time she has not taken Effexor XR 150 mg, initially she felt emotional but now she is back to her baseline. Denies depressed mood or anxiety. Wonders if she needs to resume these med.  HTN: BP started going up , so she resumed her antihypertensive medications. Currently she is on Lisinopril-HCTZ 20-12.5 mg and Amlodipine 5 mg daily. Negative for severe/frequent headache,visual changes,CP,SOB,palpitations,neurologic focal deficit.or unusual edema.  BP's 120's70's.  Lab Results  Component Value Date   CREATININE 1.18 11/22/2020   BUN 21  11/22/2020   NA 138 11/22/2020   K 2.9 (L) 11/22/2020   CL 99 11/22/2020   CO2 30 11/22/2020   HypoK+: Nausea and vomiting have resolved. She completed KLOR x 3 days and now she is on potassium rich diet. Planning on arranging appointment to recheck K+.  She started exercising , walked 2 miles today, and planning on going back to [employer].  ROS: See pertinent positives and negatives per HPI.  Past Medical History:  Diagnosis Date  . Anxiety   . Breast cancer ([place]) 2015   Right Breast Cancer  . Cancer of right breast ([place]) 09/03/13   Invasive Ductal Carcinoma/Ductal Carcinoma Insitu  biopsies  . Chronic low back pain 01/24/2016   -and R hip pain -seen by rheumatologist and ortho in the past -MRI with Dr. Lorin Mercy in 2015, R hip plain films in 2015 as well acupuncture, injections, tramadol not helpful trigger [facility] and PT helpful   . Degenerative joint disease    back neck and shoulder  . Fibromyalgia    Dr. Estanislado Pandy  . GERD (gastroesophageal reflux disease)    hx esophagitis  . Hot flashes   . Hypertension   . Leukopenia 06/16/2013  . Lupus [facility])    rheumatologist - Dr. Estanislado Pandy  . Migraines   . Neuromuscular disorder ([place])    neuropathy from chemo in feet/hands  . Personal history of chemotherapy    2015  . Personal history of radiation therapy 2015   Right Breast Cancer  . S/P radiation therapy 04/27/2014-06/07/2014   1) Right breast, 50 Gy in 25 fractions/ 2) Right breast boost, 10 Gy in 5 fractions    Past Surgical History:  Procedure Laterality  Date  . BREAST BIOPSY Left 2015   benign core  . BREAST BIOPSY Right 2015   malignant core   . BREAST LUMPECTOMY Right 2015  . BREAST SURGERY  03/01/2014   hx right breast cancer- 3 lymph nodes removed and lumpectomy  . CARPAL TUNNEL RELEASE  1998   rt  . CESAREAN SECTION     x 3  . PORTACATH PLACEMENT  08/2013   still currently active   . ROBOTIC ASSISTED BILATERAL SALPINGO OOPHERECTOMY  Bilateral 07/09/2014   Procedure: ROBOTIC ASSISTED BILATERAL SALPINGO OOPHORECTOM; :LYSIS OF ADHESIONS;  Surgeon: Serita Kyle, MD;  Location: [facility];  Service: Gynecology;  Laterality: Bilateral;  . TRIGGER FINGER RELEASE  2004   rt thumb  . trigger finger release left thumb  06/28/14   [facility]  . TUBAL LIGATION    . WISDOM TOOTH EXTRACTION      Family History  Problem Relation Age of Onset  . Hypertension Mother   . Dementia Mother        small vessel disease  . Cancer Mother 30       cervical  cancer  . Cancer Father 25       prostate ca  . Diabetes Father   . Arthritis Father   . Cancer Half-Sister   . Breast cancer Neg Hx     Social History   Socioeconomic History  . Marital status: Married    Spouse name: Not on file  . Number of children: 1  . Years of education: Not on file  . Highest education level: Not on file  Occupational History    Comment: homemaker  Tobacco Use  . Smoking status: Former Smoker    Packs/day: 1.00    Years: 15.00    Pack years: 15.00    Types: Cigars, Cigarettes    Quit date: 02/23/2004    Years since quitting: 16.7  . Smokeless tobacco: Never Used  . Tobacco comment: remote smoking history  Vaping Use  . Vaping Use: Never used  Substance and Sexual Activity  . Alcohol use: Yes    Alcohol/week: 0.0 standard drinks    Comment: rare  . Drug use: Yes    Types: Marijuana    Comment: Marijuana as needed   . Sexual activity: Yes  Other Topics Concern  . 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 Difficulty of Paying Living Expenses: Not hard at all  Food Insecurity: No Food Insecurity  . Worried About  Programme researcher, broadcasting/film/video in the Last Year: Never true  . Ran Out of Food in the Last Year: Never true  Transportation Needs: No Transportation Needs  . Lack of Transportation (Medical): No  . Lack of Transportation (Non-Medical): No  Physical Activity: Sufficiently Active  . Days of Exercise per Week: 5 days  . Minutes of Exercise per Session: 30 min  Stress: Stress Concern Present  . Feeling of Stress : To some extent  Social Connections: Socially Integrated  . Frequency of Communication with Friends and Family: More than  three times a week  . Frequency of Social Gatherings with Friends and Family: More than three times a week  . Attends Religious Services: More than 4 times per year  . Active Member of Clubs or Organizations: Yes  . Attends Archivist Meetings: 1 to 4 times per year  . Marital Status: Married  Human resources officer Violence: Not At Risk  . Fear of Current or Ex-Partner: No  . Emotionally Abused: No  . Physically Abused: No  . Sexually Abused: No    Current Outpatient Medications:  .  albuterol (VENTOLIN HFA) 108 (90 Base) MCG/ACT inhaler, Inhale 2 puffs into the lungs every 6 (six) hours as needed for wheezing or shortness of breath., Disp: 8 g, Rfl: 0 .  amLODipine (NORVASC) 5 MG tablet, Take 1 tablet (5 mg total) by mouth daily., Disp: 30 tablet, Rfl: 5 .  aspirin 81 MG tablet, Take 81 mg by mouth daily., Disp: , Rfl:  .  b complex vitamins capsule, Take 1 capsule by mouth daily., Disp: , Rfl:  .  BIOTIN 5000 PO, Take 1 capsule by mouth. Takes sometimes, Disp: , Rfl:  .  Calcium Carbonate-Vit D-Min (CALTRATE 600+D PLUS MINERALS PO), Take 1 tablet by mouth daily., Disp: , Rfl:  .  gabapentin (NEURONTIN) 600 MG tablet, Take 1 tablet (600 mg total) by mouth 3 (three) times daily., Disp: 90 tablet, Rfl: 6 .  hydroxychloroquine (PLAQUENIL) 200 MG tablet, Take 1 tablet by mouth once daily, Disp: 90 tablet, Rfl: 0 .  lisinopril-hydrochlorothiazide (ZESTORETIC) 20-12.5  MG tablet, TAKE 1 TABLET BY MOUTH ONCE DAILY . APPOINTMENT REQUIRED FOR FUTURE REFILLS, Disp: 30 tablet, Rfl: 0 .  loratadine (CLARITIN) 10 MG tablet, Take 10 mg by mouth as needed., Disp: , Rfl:  .  MAGNESIUM PO, Take by mouth daily., Disp: , Rfl:  .  omeprazole (PRILOSEC) 40 MG capsule, TAKE 1 CAPSULE BY MOUTH ONCE DAILY BEFORE BREAKFAST, Disp: 90 capsule, Rfl: 0 .  potassium chloride SA (KLOR-CON) 20 MEQ tablet, 2 tabs daily x 3 days then 1 daily., Disp: 30 tablet, Rfl: 0 .  Propylene Glycol (SYSTANE COMPLETE OP), Apply to eye., Disp: , Rfl:  .  SUMAtriptan (IMITREX) 25 MG tablet, Take 1 tablet (25 mg total) by mouth every 2 (two) hours as needed for migraine. May repeat in 2 hours if needed. Do not take more then 2 tablets in 24 hours., Disp: 10 tablet, Rfl: 0 .  TURMERIC PO, Take by mouth daily., Disp: , Rfl:  .  venlafaxine XR (EFFEXOR-XR) 150 MG 24 hr capsule, Take 1 capsule by mouth once daily, Disp: 90 capsule, Rfl: 0 No current facility-administered medications for this visit.  Facility-Administered Medications Ordered in Other Visits:  .  prochlorperazine (COMPAZINE) injection 10 mg, 10 mg, Intravenous, Q6H PRN, Delice Bison, Charlestine Massed, NP, 10 mg at 12/26/13 3244  EXAM:  VITALS per patient if applicable:BP 010/27   Ht 5\' 4"  (1.626 m)   LMP 09/28/2013 (Approximate)   BMI 28.86 kg/m   GENERAL: alert, oriented, appears well and in no acute distress  HEENT: atraumatic, conjunctiva clear, no obvious abnormalities on inspection.  NECK: normal movements of the head and neck  LUNGS: on inspection no signs of respiratory distress, breathing rate appears normal, no obvious gross SOB, gasping or wheezing  CV: no obvious cyanosis  MS: moves all visible extremities without noticeable abnormality  PSYCH/NEURO: pleasant and cooperative, no obvious depression or anxiety, speech and thought processing grossly intact  ASSESSMENT AND PLAN:  Discussed the following assessment and  plan:  Peripheral neuropathy due to chemotherapy ([place]) Symptoms have not reoccured after stopping Gabapentin, so I do not think she needs to resume medication at this time.She can do so if symptoms start back.  Autoimmune disease ([place]) We discussed indications for Evusheld, I do not think it is indicated at this time, no hx of new exposure and even thought she has a rheumatologic disorder, she is most likely have an appropriate ab response to vaccination. We could check ab in a few weeks to reassure her.  I promised I will ask about Evusheld option, so I will try to contact ID provider and let her know. I am going to ask her rheumatologist as well. I still recommend COVID 19 booster vaccine.  COVID-19 virus infection Symptoms resolved.  Primary hypertension BP adequately controlled. No changes in dose of Lisinopril-HCTZ or amlodipine. Continue low salt diet and regular physical activity. Continue monitoring BP regularly.  Hot flashes Problem is mild now, so for now she does not need to resume Effexor.  We discussed possible serious and likely etiologies, options for evaluation and workup, limitations of telemedicine visit vs in person visit, treatment, treatment risks and precautions.  I discussed the assessment and treatment plan with the patient. Tina [name] was provided an opportunity to ask questions and all were answered. She agreed with the plan and demonstrated an understanding of the instructions.   Return if symptoms worsen or fail to improve, for keep next appt..    Sava Proby Martinique, MD

## 2020-11-30 ENCOUNTER — Other Ambulatory Visit (INDEPENDENT_AMBULATORY_CARE_PROVIDER_SITE_OTHER): Payer: BC Managed Care – PPO

## 2020-11-30 ENCOUNTER — Other Ambulatory Visit: Payer: Self-pay

## 2020-11-30 DIAGNOSIS — E876 Hypokalemia: Secondary | ICD-10-CM

## 2020-12-01 LAB — POTASSIUM: Potassium: 3.6 mEq/L (ref 3.5–5.1)

## 2020-12-01 NOTE — Telephone Encounter (Signed)
Patient is calling back and stated that she was supposed to get referred to get the infusion for covid but haven't heard back yet, please advise. CB is [phone]

## 2020-12-05 NOTE — Telephone Encounter (Signed)
I called and spoke with patient. Pt was suppose to hear back from pcp in regards to the Evusheld. I sent a message to pcp to see if she ever heard back & advised pt I would contact her once I heard back.

## 2020-12-05 NOTE — Telephone Encounter (Signed)
It looks like the only ones [facility] carries are these:  The only monoclonal antibody available for treatment of COVID-19 is sotrovimab. Its supply is extremely low, with only about 7 treatments available per day at this time. The oral agents molnupiravir and paxlovid will be available for use in outpatients with COVID-19, with both also in limited supply. The COVID-19 [place] will start giving IV remdesivir in a 3-day course to outpatients with COVID-19.

## 2020-12-05 NOTE — Telephone Encounter (Signed)
I contacted one of the ID providers and asked about indication for Evusheld and at this time she does not need infusion. She has already recovered for COVID 19 infection, so I do not think she needs further treatment. I still recommend COVID 19 booster given her Hx of lupus. Thanks, BJ

## 2020-12-05 NOTE — Telephone Encounter (Signed)
I spoke with patient. She is going to do additional research on Evusheld and make a decision; she will let us know what she decides to do.

## 2020-12-14 ENCOUNTER — Ambulatory Visit: Payer: BC Managed Care – PPO | Admitting: Physician Assistant

## 2020-12-19 NOTE — Progress Notes (Deleted)
HPI: Tina [name] is a 60 y.o. female, who is here today for her routine physical.  Last CPE: 09/08/19  Regular exercise 3 or more time per week: *** Following a healthy diet: *** She lives with her son.***  Chronic medical problems: Lupus,HLD,HTN,DM II,fibromyalgia,GAD,GERD,peripheral neuropathy,and hx of right breast cancer s/p chemo.  Pap smear: *** She is established with gyn, Dr Maudry Diego  Immunization History  Administered Date(s) Administered  . Influenza Split 06/16/2012  . Influenza,inj,Quad PF,6+ Mos 06/17/2013, 04/23/2014, 04/25/2015, 05/17/2017, 05/05/2019  . PFIZER(Purple Top)SARS-COV-2 Vaccination 10/09/2019, 11/02/2019  . Pneumococcal Polysaccharide-23 02/16/2015   Mammogram: 10/09/19 Colonoscopy: 01/02/16, 10 years f/u was recommended. DEXA: 7/13/21osteopenia. ***  Hep C screening: 02/2017 NR.  She has *** concerns today.  Lab Results  Component Value Date   CHOL 262 (H) 09/08/2019   HDL 63.80 09/08/2019   LDLCALC 159 (H) 09/08/2019   LDLDIRECT 123.0 10/26/2014   TRIG 199.0 (H) 09/08/2019   CHOLHDL 4 09/08/2019   Lab Results  Component Value Date   HGBA1C 6.2 09/08/2019     Review of Systems  Current Outpatient Medications on File Prior to Visit  Medication Sig Dispense Refill  . albuterol (VENTOLIN HFA) 108 (90 Base) MCG/ACT inhaler Inhale 2 puffs into the lungs every 6 (six) hours as needed for wheezing or shortness of breath. 8 g 0  . amLODipine (NORVASC) 5 MG tablet Take 1 tablet (5 mg total) by mouth daily. 30 tablet 5  . aspirin 81 MG tablet Take 81 mg by mouth daily.    Marland Kitchen b complex vitamins capsule Take 1 capsule by mouth daily.    Marland Kitchen BIOTIN 5000 PO Take 1 capsule by mouth. Takes sometimes    . Calcium Carbonate-Vit D-Min (CALTRATE 600+D PLUS MINERALS PO) Take 1 tablet by mouth daily.    Marland Kitchen gabapentin (NEURONTIN) 600 MG tablet Take 1 tablet (600 mg total) by mouth 3 (three) times daily. 90 tablet 6  . hydroxychloroquine (PLAQUENIL) 200 MG  tablet Take 1 tablet by mouth once daily 90 tablet 0  . lisinopril-hydrochlorothiazide (ZESTORETIC) 20-12.5 MG tablet TAKE 1 TABLET BY MOUTH ONCE DAILY . APPOINTMENT REQUIRED FOR FUTURE REFILLS 30 tablet 0  . loratadine (CLARITIN) 10 MG tablet Take 10 mg by mouth as needed.    Marland Kitchen MAGNESIUM PO Take by mouth daily.    Marland Kitchen omeprazole (PRILOSEC) 40 MG capsule TAKE 1 CAPSULE BY MOUTH ONCE DAILY BEFORE BREAKFAST 90 capsule 0  . potassium chloride SA (KLOR-CON) 20 MEQ tablet 2 tabs daily x 3 days then 1 daily. 30 tablet 0  . Propylene Glycol (SYSTANE COMPLETE OP) Apply to eye.    . SUMAtriptan (IMITREX) 25 MG tablet Take 1 tablet (25 mg total) by mouth every 2 (two) hours as needed for migraine. May repeat in 2 hours if needed. Do not take more then 2 tablets in 24 hours. 10 tablet 0  . TURMERIC PO Take by mouth daily.    Marland Kitchen venlafaxine XR (EFFEXOR-XR) 150 MG 24 hr capsule Take 1 capsule by mouth once daily 90 capsule 0   Current Facility-Administered Medications on File Prior to Visit  Medication Dose Route Frequency Provider Last Rate Last Admin  . prochlorperazine (COMPAZINE) injection 10 mg  10 mg Intravenous Q6H PRN Gardenia Phlegm, NP   10 mg at 12/26/13 2458     Past Medical History:  Diagnosis Date  . Anxiety   . Breast cancer ([place]) 2015   Right Breast Cancer  . Cancer of right breast (  [place]) 09/03/13   Invasive Ductal Carcinoma/Ductal Carcinoma Insitu  biopsies  . Chronic low back pain 01/24/2016   -and R hip pain -seen by rheumatologist and ortho in the past -MRI with Dr. Lorin Mercy in 2015, R hip plain films in 2015 as well acupuncture, injections, tramadol not helpful trigger therapy [facility] and PT helpful   . Degenerative joint disease    back neck and shoulder  . Fibromyalgia    Dr. Estanislado Pandy  . GERD (gastroesophageal reflux disease)    hx esophagitis  . Hot flashes   . Hypertension   . Leukopenia 06/16/2013  . Lupus [facility])    rheumatologist - Dr. Estanislado Pandy  .  Migraines   . Neuromuscular disorder ([place])    neuropathy from chemo in feet/hands  . Personal history of chemotherapy    2015  . Personal history of radiation therapy 2015   Right Breast Cancer  . S/P radiation therapy 04/27/2014-06/07/2014   1) Right breast, 50 Gy in 25 fractions/ 2) Right breast boost, 10 Gy in 5 fractions    Past Surgical History:  Procedure Laterality Date  . BREAST BIOPSY Left 2015   benign core  . BREAST BIOPSY Right 2015   malignant core   . BREAST LUMPECTOMY Right 2015  . BREAST SURGERY  03/01/2014   hx right breast cancer- 3 lymph nodes removed and lumpectomy  . [place]   rt  . CESAREAN SECTION     x 3  . PORTACATH PLACEMENT  08/2013   still currently active   . ROBOTIC ASSISTED BILATERAL SALPINGO OOPHERECTOMY Bilateral 07/09/2014   Procedure: ROBOTIC ASSISTED BILATERAL SALPINGO OOPHORECTOM; :LYSIS OF ADHESIONS;  Surgeon: Marvene Staff, MD;  Location: [facility];  Service: Gynecology;  Laterality: Bilateral;  . TRIGGER FINGER RELEASE  2004   rt thumb  . trigger finger release left thumb  06/28/14   at orthopedic surgical center  . TUBAL LIGATION    . WISDOM TOOTH EXTRACTION      Allergies  Allergen Reactions  . Adhesive [Tape] Itching    Burned and scarred skinned  . Other     Cholroprep-causes rash  . Nickel Rash    Family History  Problem Relation Age of Onset  . Hypertension Mother   . Dementia Mother        small vessel disease  . Cancer Mother 30       cervical  cancer  . Cancer Father 60       prostate ca  . Diabetes Father   . Arthritis Father   . Cancer Half-Sister   . Breast cancer Neg Hx     Social History   Socioeconomic History  . Marital status: Married    Spouse name: Not on file  . Number of children: 1  . Years of education: Not on file  . Highest education level: Not on file  Occupational History    Comment: homemaker  Tobacco Use  . Smoking status: Former Smoker    Packs/day: 1.00     Years: 15.00    Pack years: 15.00    Types: Cigars, Cigarettes    Quit date: 02/23/2004    Years since quitting: 16.8  . Smokeless tobacco: Never Used  . Tobacco comment: remote smoking history  Vaping Use  . Vaping Use: Never used  Substance and Sexual Activity  . Alcohol use: Yes    Alcohol/week: 0.0 standard drinks    Comment: rare  . Drug use: Yes  Types: Marijuana    Comment: Marijuana as needed   . Sexual activity: Yes  Other Topics Concern  . Not on file  Social History Narrative   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      Home Situation: lives with husband and 51 yo son      Spiritual Beliefs: Christian      Lifestyle: elliptical 3-4 times per week; working on diet as well over last year in 2014            Social Determinants of Health   Financial Resource Strain: Low Risk   . Difficulty of Paying Living Expenses: Not hard at all  Food Insecurity: No Food Insecurity  . Worried About Charity fundraiser in the Last Year: Never true  . Ran Out of Food in the Last Year: Never true  Transportation Needs: No Transportation Needs  . Lack of Transportation (Medical): No  . Lack of Transportation (Non-Medical): No  Physical Activity: Sufficiently Active  . Days of Exercise per Week: 5 days  . Minutes of Exercise per Session: 30 min  Stress: Stress Concern Present  . Feeling of Stress : To some extent  Social Connections: Socially Integrated  . Frequency of Communication with Friends and Family: More than three times a week  . Frequency of Social Gatherings with Friends and Family: More than three times a week  . Attends Religious Services: More than 4 times per year  . Active Member of Clubs or Organizations: Yes  . Attends Archivist Meetings: 1 to 4 times per year  . Marital Status: Married     There were no vitals filed for this visit. There is no height or weight on file to calculate BMI.   Wt Readings from Last 3 Encounters:  11/22/20 168 lb 2 oz  (76.3 kg)  11/16/20 180 lb 12.4 oz (82 kg)  11/07/20 179 lb (81.2 kg)     Physical Exam    ASSESSMENT AND PLAN:  Tina [name] was here today annual physical examination.     No orders of the defined types were placed in this encounter.   Diagnoses and all orders for this visit:  Hyperlipidemia, unspecified hyperlipidemia type    No problem-specific Assessment & Plan notes found for this encounter.            No follow-ups on file.          Tina Sossamon G. Martinique, MD  [facility]. [place] office.

## 2020-12-20 ENCOUNTER — Other Ambulatory Visit (INDEPENDENT_AMBULATORY_CARE_PROVIDER_SITE_OTHER): Payer: BC Managed Care – PPO

## 2020-12-20 ENCOUNTER — Other Ambulatory Visit: Payer: Self-pay

## 2020-12-20 ENCOUNTER — Encounter: Payer: BC Managed Care – PPO | Admitting: Family Medicine

## 2020-12-20 DIAGNOSIS — E785 Hyperlipidemia, unspecified: Secondary | ICD-10-CM | POA: Diagnosis not present

## 2020-12-20 DIAGNOSIS — I10 Essential (primary) hypertension: Secondary | ICD-10-CM

## 2020-12-20 DIAGNOSIS — E118 Type 2 diabetes mellitus with unspecified complications: Secondary | ICD-10-CM | POA: Diagnosis not present

## 2020-12-20 DIAGNOSIS — Z79899 Other long term (current) drug therapy: Secondary | ICD-10-CM

## 2020-12-20 LAB — LIPID PANEL
Cholesterol: 262 mg/dL — ABNORMAL HIGH (ref 0–200)
HDL: 76.2 mg/dL (ref >39.00)
LDL Cholesterol: 153 mg/dL — ABNORMAL HIGH (ref 0–99)
NonHDL: 186.27
Total CHOL/HDL Ratio: 3
Triglycerides: 165 mg/dL — ABNORMAL HIGH (ref 0.0–149.0)
VLDL: 33 mg/dL (ref 0.0–40.0)

## 2020-12-20 LAB — MICROALBUMIN / CREATININE URINE RATIO
Creatinine,U: 407.2 mg/dL
Microalb Creat Ratio: 1 mg/g (ref 0.0–30.0)
Microalb, Ur: 4.1 mg/dL — ABNORMAL HIGH (ref 0.0–1.9)

## 2020-12-20 LAB — HEMOGLOBIN A1C: Hgb A1c MFr Bld: 6 % (ref 4.6–6.5)

## 2020-12-20 NOTE — Telephone Encounter (Signed)
I spoke with patient while she was here in the office. Both Dr. Martinique and pt's rheumatologist did not recommend getting the infusion, they recommend the booster vaccine. Pt is aware.

## 2020-12-20 NOTE — Telephone Encounter (Signed)
Patient states that she is still waiting to hear from Dr. Martinique to see if she recommends her getting this booster.  She goes to [place] tomorrow to get this done so needs an answer by today.

## 2020-12-27 NOTE — Progress Notes (Signed)
HPI: [name] is a 60 y.o. female, who is here today for her routine physical.  Last CPE: 09/08/19  Regular exercise 3 or more time per week: She is walking about 2 miles daily. Following a healthy diet: She stopped sweet tea,read meat,potatoe chips,and fast food a month ago. Decreased fried food intake, got an air fryer.  Chronic medical problems: Lupus,HLD,HTN,DM II,fibromyalgia,GAD,GERD,peripheral neuropathy,and hx of right breast cancer s/p chemo.  Pap smear: 09/29/18. She is established with gyn, Dr Tina [name]  Immunization History  Administered Date(s) Administered  . Influenza Split 06/16/2012  . Influenza,inj,Quad PF,6+ Mos 06/17/2013, 04/23/2014, 04/25/2015, 05/17/2017, 05/05/2019  . PFIZER(Purple Top)SARS-COV-2 Vaccination 10/09/2019, 11/02/2019  . Pneumococcal Polysaccharide-23 02/16/2015   Mammogram: 10/09/19 Colonoscopy: 01/02/16, 10 years f/u was recommended. DEXA: 02/09/20 osteopenia.  Hep C screening: 02/2017 NR.  She has no concerns today.  HLD: She is not on pharmacologic treatment.  Lab Results  Component Value Date   CHOL 262 (H) 12/20/2020   HDL 76.20 12/20/2020   LDLCALC 153 (H) 12/20/2020   LDLDIRECT 123.0 10/26/2014   TRIG 165.0 (H) 12/20/2020   CHOLHDL 3 12/20/2020   DM II: Dx'ed in 11/2018. She is on non pharmacologic treatment.  Lab Results  Component Value Date   HGBA1C 6.0 12/20/2020  Peripheral neuropathy: Since her last visit she resumed Gabapentin 600 mg tid because burning feet sensation was getting worse again. She follows with neurologist.  Review of Systems  Constitutional: Negative for appetite change and fever.  HENT: Negative for hearing loss, mouth sores, sore throat and trouble swallowing.   Eyes: Negative for redness and visual disturbance.  Respiratory: Negative for cough, shortness of breath and wheezing.   Cardiovascular: Negative for chest pain and leg swelling.  Gastrointestinal: Negative for abdominal pain, nausea  and vomiting.       No changes in bowel habits.  Endocrine: Negative for cold intolerance, heat intolerance, polydipsia, polyphagia and polyuria.  Genitourinary: Negative for decreased urine volume, dysuria, hematuria, vaginal bleeding and vaginal discharge.  Musculoskeletal: Positive for arthralgias. Negative for gait problem and myalgias.  Skin: Negative for color change and rash.  Allergic/Immunologic: Positive for environmental allergies.  Neurological: Negative for syncope, weakness and headaches.  Hematological: Negative for adenopathy. Does not bruise/bleed easily.  Psychiatric/Behavioral: Negative for confusion and sleep disturbance. The patient is not nervous/anxious.   All other systems reviewed and are negative.  Current Outpatient Medications on File Prior to Visit  Medication Sig Dispense Refill  . albuterol (VENTOLIN HFA) 108 (90 Base) MCG/ACT inhaler Inhale 2 puffs into the lungs every 6 (six) hours as needed for wheezing or shortness of breath. 8 g 0  . aspirin 81 MG tablet Take 81 mg by mouth daily.    Marland Kitchen b complex vitamins capsule Take 1 capsule by mouth daily.    Marland Kitchen BIOTIN 5000 PO Take 1 capsule by mouth. Takes sometimes    . Calcium Carbonate-Vit D-Min (CALTRATE 600+D PLUS MINERALS PO) Take 1 tablet by mouth daily.    Marland Kitchen gabapentin (NEURONTIN) 600 MG tablet Take 1 tablet (600 mg total) by mouth 3 (three) times daily. 90 tablet 6  . hydroxychloroquine (PLAQUENIL) 200 MG tablet Take 1 tablet by mouth once daily 90 tablet 0  . loratadine (CLARITIN) 10 MG tablet Take 10 mg by mouth as needed.    Marland Kitchen MAGNESIUM PO Take by mouth daily.    Marland Kitchen omeprazole (PRILOSEC) 40 MG capsule TAKE 1 CAPSULE BY MOUTH ONCE DAILY BEFORE BREAKFAST 90 capsule 0  .  Propylene Glycol (SYSTANE COMPLETE OP) Apply to eye.    . SUMAtriptan (IMITREX) 25 MG tablet Take 1 tablet (25 mg total) by mouth every 2 (two) hours as needed for migraine. May repeat in 2 hours if needed. Do not take more then 2 tablets in 24  hours. 10 tablet 0  . TURMERIC PO Take by mouth daily.    Marland Kitchen venlafaxine XR (EFFEXOR-XR) 150 MG 24 hr capsule Take 1 capsule by mouth once daily 90 capsule 0   Current Facility-Administered Medications on File Prior to Visit  Medication Dose Route Frequency Provider Last Rate Last Admin  . prochlorperazine (COMPAZINE) injection 10 mg  10 mg Intravenous Q6H PRN Gardenia Phlegm, NP   10 mg at 12/26/13 9326   Past Medical History:  Diagnosis Date  . Anxiety   . Breast cancer ([place]) 2015   Right Breast Cancer  . Cancer of right breast ([place]) 09/03/13   Invasive Ductal Carcinoma/Ductal Carcinoma Insitu  biopsies  . Chronic low back pain 01/24/2016   -and R hip pain -seen by rheumatologist and ortho in the past -MRI with Dr. Lorin Mercy in 2015, R hip plain films in 2015 as well acupuncture, injections, tramadol not helpful trigger therapy [facility] and PT helpful   . Degenerative joint disease    back neck and shoulder  . Fibromyalgia    Dr. Estanislado Pandy  . GERD (gastroesophageal reflux disease)    hx esophagitis  . Hot flashes   . Hypertension   . Leukopenia 06/16/2013  . Lupus [facility])    rheumatologist - Dr. Estanislado Pandy  . Migraines   . Neuromuscular disorder ([place])    neuropathy from chemo in feet/hands  . Personal history of chemotherapy    2015  . Personal history of radiation therapy 2015   Right Breast Cancer  . S/P radiation therapy 04/27/2014-06/07/2014   1) Right breast, 50 Gy in 25 fractions/ 2) Right breast boost, 10 Gy in 5 fractions    Past Surgical History:  Procedure Laterality Date  . BREAST BIOPSY Left 2015   benign core  . BREAST BIOPSY Right 2015   malignant core   . BREAST LUMPECTOMY Right 2015  . BREAST SURGERY  03/01/2014   hx right breast cancer- 3 lymph nodes removed and lumpectomy  . [facility]   rt  . CESAREAN SECTION     x 3  . PORTACATH PLACEMENT  08/2013   still currently active   . ROBOTIC ASSISTED BILATERAL SALPINGO  OOPHERECTOMY Bilateral 07/09/2014   Procedure: ROBOTIC ASSISTED BILATERAL SALPINGO OOPHORECTOM; :LYSIS OF ADHESIONS;  Surgeon: Tina Staff, MD;  Location: [facility];  Service: Gynecology;  Laterality: Bilateral;  . TRIGGER FINGER RELEASE  2004   rt thumb  . trigger finger release left thumb  06/28/14   at [facility]  . TUBAL LIGATION    . WISDOM TOOTH EXTRACTION      Allergies  Allergen Reactions  . Adhesive [Tape] Itching    Burned and scarred skinned  . Other     Cholroprep-causes rash  . Nickel Rash    Family History  Problem Relation Age of Onset  . Hypertension Mother   . Dementia Mother        small vessel disease  . Cancer Mother 30       cervical  cancer  . Cancer Father 71       prostate ca  . Diabetes Father   . Arthritis Father   .  Cancer Half-Sister   . Breast cancer Neg Hx     Social History   Socioeconomic History  . Marital status: Married    Spouse name: Not on file  . Number of children: 1  . Years of education: Not on file  . Highest education level: Not on file  Occupational History    Comment: homemaker  Tobacco Use  . Smoking status: Former Smoker    Packs/day: 1.00    Years: 15.00    Pack years: 15.00    Types: Cigars, Cigarettes    Quit date: 02/23/2004    Years since quitting: 16.8  . Smokeless tobacco: Never Used  . Tobacco comment: remote smoking history  Vaping Use  . Vaping Use: Never used  Substance and Sexual Activity  . Alcohol use: Yes    Alcohol/week: 0.0 standard drinks    Comment: rare  . Drug use: Yes    Types: Marijuana    Comment: Marijuana as needed   . Sexual activity: Yes  Other Topics Concern  . Not on file  Social History Narrative   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      Home Situation: lives with husband and 65 yo son      Spiritual Beliefs: Christian      Lifestyle: elliptical 3-4 times per week; working on diet as well over last year in 2014            Social Determinants of  Health   Financial Resource Strain: Low Risk   . Difficulty of Paying Living Expenses: Not hard at all  Food Insecurity: No Food Insecurity  . Worried About Charity fundraiser in the Last Year: Never true  . Ran Out of Food in the Last Year: Never true  Transportation Needs: No Transportation Needs  . Lack of Transportation (Medical): No  . Lack of Transportation (Non-Medical): No  Physical Activity: Sufficiently Active  . Days of Exercise per Week: 5 days  . Minutes of Exercise per Session: 30 min  Stress: Stress Concern Present  . Feeling of Stress : To some extent  Social Connections: Socially Integrated  . Frequency of Communication with Friends and Family: More than three times a week  . Frequency of Social Gatherings with Friends and Family: More than three times a week  . Attends Religious Services: More than 4 times per year  . Active Member of Clubs or Organizations: Yes  . Attends Archivist Meetings: 1 to 4 times per year  . Marital Status: Married   Vitals:   12/28/20 1538  BP: 128/80  Pulse: 100  Resp: 12  SpO2: 99%   Body mass index is 29.87 kg/m.  Wt Readings from Last 3 Encounters:  12/28/20 174 lb (78.9 kg)  11/22/20 168 lb 2 oz (76.3 kg)  11/16/20 180 lb 12.4 oz (82 kg)   Physical Exam Vitals and nursing note reviewed.  Constitutional:      General: She is not in acute distress.    Appearance: She is well-developed.  HENT:     Head: Normocephalic and atraumatic.     Right Ear: Hearing, tympanic membrane, ear canal and external ear normal.     Left Ear: Hearing, tympanic membrane, ear canal and external ear normal.     Mouth/Throat:     Mouth: Mucous membranes are moist.     Pharynx: Oropharynx is clear. Uvula midline.  Eyes:     Conjunctiva/sclera: Conjunctivae normal.     Pupils: Pupils  are equal, round, and reactive to light.  Neck:     Thyroid: No thyromegaly.     Trachea: No tracheal deviation.  Cardiovascular:     Rate and  Rhythm: Normal rate and regular rhythm.     Pulses:          Dorsalis pedis pulses are 2+ on the right side and 2+ on the left side.     Heart sounds: No murmur heard.   Pulmonary:     Effort: Pulmonary effort is normal. No respiratory distress.     Breath sounds: Normal breath sounds.  Chest:  Breasts:     Right: No supraclavicular adenopathy.     Left: No supraclavicular adenopathy.    Abdominal:     Palpations: Abdomen is soft. There is no hepatomegaly or mass.     Tenderness: There is no abdominal tenderness.  Genitourinary:    Comments: Deferred to gyn. Musculoskeletal:     Comments: No signs of synovitis appreciated.  Lymphadenopathy:     Cervical: No cervical adenopathy.     Upper Body:     Right upper body: No supraclavicular adenopathy.     Left upper body: No supraclavicular adenopathy.  Skin:    General: Skin is warm.     Findings: No erythema or rash.  Neurological:     General: No focal deficit present.     Mental Status: She is alert and oriented to person, place, and time.     Cranial Nerves: No cranial nerve deficit.     Gait: Gait normal.     Deep Tendon Reflexes:     Reflex Scores:      Bicep reflexes are 2+ on the right side and 2+ on the left side.      Patellar reflexes are 2+ on the right side and 2+ on the left side. Psychiatric:        Speech: Speech normal.     Comments: Well groomed, good eye contact.   ASSESSMENT AND PLAN:  [name] was here today annual physical examination.  Diagnoses and all orders for this visit:  Routine general medical examination at a health care facility We discussed the importance of regular physical activity and healthy diet for prevention of chronic illness and/or complications. Preventive guidelines reviewed. Vaccination up to date. Continue her female preventive care with Dr Tina [name]. Ca++ and vit D supplementation recommended. Next CPE in a year.  The 10-year ASCVD risk score Tina Bussing DC Brooke Bonito., et  al., 2013) is: 13.9%   Values used to calculate the score:     Age: 48 years     Sex: Female     Is Non-Hispanic African American: Yes     Diabetic: Yes     Tobacco smoker: No     Systolic Blood Pressure: 185 mmHg     Is BP treated: Yes     HDL Cholesterol: 76.2 mg/dL     Total Cholesterol: 262 mg/dL  Essential hypertension BP adequately controlled. Continue Lisinopril-HCTZ 20-12.5 mg daily. Continue monitoring BP and low salt diet.  -     lisinopril-hydrochlorothiazide (ZESTORETIC) 20-12.5 MG tablet; Take 1 tablet by mouth daily.  Hyperlipidemia, unspecified hyperlipidemia type We discussed CV benefits of stains and current guidelines. She is not interested in medication, would like continue working on low fat diet. F/U in 6 months.  Peripheral neuropathy due to chemotherapy Tina [name]) Improved after resuming Gabapentin, so continue 600 mg tid. Continue appropriate foot care. Following with neurologist.   Controlled  type 2 diabetes mellitus with complication, without long-term current use of insulin (Tina [name]) HgA1C at goal. Continue non pharmacologic treatment. Regular exercise and healthy diet with avoidance of added sugar food intake is an important part of treatment and recommended. Annual eye exam, periodic dental and foot care recommended. F/U in 5-6 months  Return in 6 months (on 06/29/2021) for HTN,DM II<HLD.   Mayson Sterbenz G. Martinique, MD  [facility]. [place] office.  Today you have you routine preventive visit. A few things to remember from today's visit:  Routine general medical examination at a health care facility  Essential hypertension - Plan: lisinopril-hydrochlorothiazide (ZESTORETIC) 20-12.5 MG tablet  Hyperlipidemia, unspecified hyperlipidemia type  Peripheral neuropathy due to chemotherapy [facility])  If you need refills please call your pharmacy. Do not use My Chart to request refills or for acute issues that need immediate attention.    Please be  sure medication list is accurate. If a new problem present, please set up appointment sooner than planned today.  At least 150 minutes of moderate exercise per week, daily brisk walking for 15-30 min is a good exercise option. Healthy diet low in saturated (animal) fats and sweets and consisting of fresh fruits and vegetables, lean meats such as fish and white chicken and whole grains.  These are some of recommendations for screening depending of age and risk factors:  - Vaccines:  Tdap vaccine every 10 years.  Shingles vaccine recommended at age 75, could be given after 58 years of age but not sure about insurance coverage.   Pneumonia vaccines: Pneumovax at 26. Sometimes Pneumovax is giving earlier if history of smoking, lung disease,diabetes,kidney disease among some.  Screening for diabetes at age 67 and every 3 years.N/A  Cervical cancer prevention:  Pap smear starts at 58 years of age and continues periodically until 58 years old in low risk women. Pap smear every 3 years between 73 and 89 years old. Pap smear every 3-5 years between women 47 and older if pap smear negative and HPV screening negative. Please arrange appt with gyn.  -Breast cancer: Mammogram: There is disagreement between experts about when to start screening in low risk asymptomatic female but recent recommendations are to start screening at 23 and not later than 58 years old , every 1-2 years and after 58 yo q 2 years. Screening is recommended until 58 years old but some women can continue screening depending of healthy issues.  Colon cancer screening: Has been recently changed to 58 yo. Insurance may not cover until you are 58 years old. Screening is recommended until 59 years old.  Cholesterol disorder screening at age 63 and every 3 years.N/A  Also recommended:  1. Dental visit- Brush and floss your teeth twice daily; visit your dentist twice a year. 2. Eye doctor- Get an eye exam at least every 2  years. 3. Helmet use- Always wear a helmet when riding a bicycle, motorcycle, rollerblading or skateboarding. 4. Safe sex- If you may be exposed to sexually transmitted infections, use a condom. 5. Seat belts- Seat belts can save your live; always wear one. 6. Smoke/Carbon Monoxide detectors- These detectors need to be installed on the appropriate level of your home. Replace batteries at least once a year. 7. Skin cancer- When out in the sun please cover up and use sunscreen 15 SPF or higher. 8. Violence- If anyone is threatening or hurting you, please tell your healthcare provider.  9. Drink alcohol in moderation- Limit alcohol intake to one drink or  less per day. Never drink and drive. 10. Calcium supplementation 1000 to 1200 mg daily, ideally through your diet.  Vitamin D supplementation 800 units daily.

## 2020-12-28 ENCOUNTER — Encounter: Payer: Self-pay | Admitting: Family Medicine

## 2020-12-28 ENCOUNTER — Ambulatory Visit (INDEPENDENT_AMBULATORY_CARE_PROVIDER_SITE_OTHER): Payer: BC Managed Care – PPO | Admitting: Family Medicine

## 2020-12-28 ENCOUNTER — Other Ambulatory Visit: Payer: Self-pay

## 2020-12-28 VITALS — BP 128/80 | HR 100 | Resp 12 | Ht 64.0 in | Wt 174.0 lb

## 2020-12-28 DIAGNOSIS — G620 Drug-induced polyneuropathy: Secondary | ICD-10-CM | POA: Diagnosis not present

## 2020-12-28 DIAGNOSIS — E118 Type 2 diabetes mellitus with unspecified complications: Secondary | ICD-10-CM

## 2020-12-28 DIAGNOSIS — Z0000 Encounter for general adult medical examination without abnormal findings: Secondary | ICD-10-CM

## 2020-12-28 DIAGNOSIS — E785 Hyperlipidemia, unspecified: Secondary | ICD-10-CM | POA: Diagnosis not present

## 2020-12-28 DIAGNOSIS — T451X5A Adverse effect of antineoplastic and immunosuppressive drugs, initial encounter: Secondary | ICD-10-CM

## 2020-12-28 DIAGNOSIS — I10 Essential (primary) hypertension: Secondary | ICD-10-CM | POA: Diagnosis not present

## 2020-12-28 MED ORDER — LISINOPRIL-HYDROCHLOROTHIAZIDE 20-12.5 MG PO TABS: 1.0000 | ORAL_TABLET | Freq: Every day | ORAL | 3 refills | Status: DC

## 2020-12-28 NOTE — Patient Instructions (Addendum)
Today you have you routine preventive visit. A few things to remember from today's visit:  Routine general medical examination at a health care facility  Essential hypertension - Plan: lisinopril-hydrochlorothiazide (ZESTORETIC) 20-12.5 MG tablet  Hyperlipidemia, unspecified hyperlipidemia type  Peripheral neuropathy due to chemotherapy [facility])  If you need refills please call your pharmacy. Do not use My Chart to request refills or for acute issues that need immediate attention.    Please be sure medication list is accurate. If a new problem present, please set up appointment sooner than planned today.  At least 150 minutes of moderate exercise per week, daily brisk walking for 15-30 min is a good exercise option. Healthy diet low in saturated (animal) fats and sweets and consisting of fresh fruits and vegetables, lean meats such as fish and white chicken and whole grains.  These are some of recommendations for screening depending of age and risk factors:  - Vaccines:  Tdap vaccine every 10 years.  Shingles vaccine recommended at age 46, could be given after 58 years of age but not sure about insurance coverage.   Pneumonia vaccines: Pneumovax at 51. Sometimes Pneumovax is giving earlier if history of smoking, lung disease,diabetes,kidney disease among some.  Screening for diabetes at age 79 and every 3 years.N/A  Cervical cancer prevention:  Pap smear starts at 58 years of age and continues periodically until 58 years old in low risk women. Pap smear every 3 years between 51 and 27 years old. Pap smear every 3-5 years between women 81 and older if pap smear negative and HPV screening negative. Please arrange appt with gyn.  -Breast cancer: Mammogram: There is disagreement between experts about when to start screening in low risk asymptomatic female but recent recommendations are to start screening at 58 and not later than 58 years old , every 1-2 years and after 58 yo q 2  years. Screening is recommended until 58 years old but some women can continue screening depending of healthy issues.  Colon cancer screening: Has been recently changed to 58 yo. Insurance may not cover until you are 58 years old. Screening is recommended until 58 years old.  Cholesterol disorder screening at age 7 and every 3 years.N/A  Also recommended:  1. Dental visit- Brush and floss your teeth twice daily; visit your dentist twice a year. 2. Eye doctor- Get an eye exam at least every 2 years. 3. Helmet use- Always wear a helmet when riding a bicycle, motorcycle, rollerblading or skateboarding. 4. Safe sex- If you may be exposed to sexually transmitted infections, use a condom. 5. Seat belts- Seat belts can save your live; always wear one. 6. Smoke/Carbon Monoxide detectors- These detectors need to be installed on the appropriate level of your home. Replace batteries at least once a year. 7. Skin cancer- When out in the sun please cover up and use sunscreen 15 SPF or higher. 8. Violence- If anyone is threatening or hurting you, please tell your healthcare provider.  9. Drink alcohol in moderation- Limit alcohol intake to one drink or less per day. Never drink and drive. 10. Calcium supplementation 1000 to 1200 mg daily, ideally through your diet.  Vitamin D supplementation 800 units daily.

## 2020-12-29 ENCOUNTER — Ambulatory Visit: Payer: BC Managed Care – PPO

## 2021-01-10 ENCOUNTER — Other Ambulatory Visit: Payer: Self-pay | Admitting: Neurology

## 2021-01-10 ENCOUNTER — Other Ambulatory Visit: Payer: Self-pay | Admitting: Family Medicine

## 2021-01-10 DIAGNOSIS — C50511 Malignant neoplasm of lower-outer quadrant of right female breast: Secondary | ICD-10-CM

## 2021-01-16 ENCOUNTER — Encounter: Payer: Self-pay | Admitting: Family Medicine

## 2021-01-16 ENCOUNTER — Ambulatory Visit (INDEPENDENT_AMBULATORY_CARE_PROVIDER_SITE_OTHER): Payer: BC Managed Care – PPO | Admitting: Family Medicine

## 2021-01-16 ENCOUNTER — Other Ambulatory Visit: Payer: Self-pay

## 2021-01-16 VITALS — BP 132/84 | HR 93 | Temp 99.2°F | Wt 174.8 lb

## 2021-01-16 DIAGNOSIS — K047 Periapical abscess without sinus: Secondary | ICD-10-CM | POA: Diagnosis not present

## 2021-01-16 DIAGNOSIS — K029 Dental caries, unspecified: Secondary | ICD-10-CM | POA: Diagnosis not present

## 2021-01-16 DIAGNOSIS — R600 Localized edema: Secondary | ICD-10-CM | POA: Diagnosis not present

## 2021-01-16 MED ORDER — AMOXICILLIN 500 MG PO CAPS: 500.0000 mg | ORAL_CAPSULE | Freq: Two times a day (BID) | ORAL | 0 refills | Status: AC

## 2021-01-16 NOTE — Progress Notes (Signed)
Subjective:    Patient ID: Tina [name], female    DOB: Nov 04, 1960, 60 y.o.   MRN: [id number]  Chief Complaint  Patient presents with   Facial Swelling    Swelling on Left side of face, started Saturday and is getting worse. States it throbs, a lot when she leans forward or bends over. Has been taking tylenol.    HPI Patient was seen today for acute concern.  Patient endorses left-sided facial edema which started on Saturday (2 days ago).  Patient notes increased warmth, mild erythema, and tenderness of left cheek/face.  Endorses subjective fever.  Tried Tylenol.  Left side of face throbs if he leans forward.  Patient endorses needing dental work/teeth extracted.  Last seen by a Dentist in September 2021, has an appointment coming up this wk.   Past Medical History:  Diagnosis Date   Anxiety    Breast cancer ([place]) 2015   Right Breast Cancer   Cancer of right breast ([place]) 09/03/13   Invasive Ductal Carcinoma/Ductal Carcinoma Insitu  biopsies   Chronic low back pain 01/24/2016   -and R hip pain -seen by rheumatologist and ortho in the past -MRI with Dr. Lorin Mercy in 2015, R hip plain films in 2015 as well acupuncture, injections, tramadol not helpful trigger therapy [facility] and PT helpful    Degenerative joint disease    back neck and shoulder   Fibromyalgia    Dr. Estanislado Pandy   GERD (gastroesophageal reflux disease)    hx esophagitis   Hot flashes    Hypertension    Leukopenia 06/16/2013   Lupus [facility])    rheumatologist - Dr. 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   Personal history of chemotherapy    2015   Personal history of radiation therapy 2015   Right Breast Cancer   S/P radiation therapy 04/27/2014-06/07/2014   1) Right breast, 50 Gy in 25 fractions/ 2) Right breast boost, 10 Gy in 5 fractions    Allergies  Allergen Reactions   Adhesive [Tape] Itching    Burned and scarred skinned   Other      Cholroprep-causes rash   Nickel Rash    ROS General: Denies fever, chills, night sweats, changes in weight, changes in appetite HEENT: Denies headaches, ear pain, changes in vision, rhinorrhea, sore throat  + left-sided facial edema, tenderness, increased warmth, and mild erythema CV: Denies CP, palpitations, SOB, orthopnea Pulm: Denies SOB, cough, wheezing GI: Denies abdominal pain, nausea, vomiting, diarrhea, constipation GU: Denies dysuria, hematuria, frequency, vaginal discharge Msk: Denies muscle cramps, joint pains Neuro: Denies weakness, numbness, tingling Skin: Denies rashes, bruising Psych: Denies depression, anxiety, hallucinations     Objective:    Blood pressure 132/84, pulse 93, temperature 99.2 F (37.3 C), temperature source Oral, weight 174 lb 12.8 oz (79.3 kg), last menstrual period 09/28/2013, SpO2 99 %.  Gen. Pleasant, well-nourished, in no distress, normal affect   HEENT: [place]/AT, face asymmetric with edema L>R, conjunctiva clear, no scleral icterus, PERRLA, EOMI, nares patent without drainage, a partial in place with broken upper left molars and dental caries throughout.  pharynx without erythema or exudate.  Bilateral TMs full.  Mild cervical lymphadenopathy Lungs: no accessory muscle use Cardiovascular: RRR, no peripheral edema Musculoskeletal: Left-sided facial edema with tenderness and increased warmth.  no deformities, no cyanosis or clubbing, normal tone Neuro:  A&Ox3, CN II-XII intact, normal gait Skin:  Warm, no lesions/ rash.  TTP of  left mandible and maxilla proximal to L nares with edema and increased warmth.   Wt Readings from Last 3 Encounters:  01/16/21 174 lb 12.8 oz (79.3 kg)  12/28/20 174 lb (78.9 kg)  11/22/20 168 lb 2 oz (76.3 kg)    Lab Results  Component Value Date   WBC 3.1 (L) 11/22/2020   HGB 14.6 11/22/2020   HCT 42.6 11/22/2020   PLT 221.0 11/22/2020   GLUCOSE 108 (H) 11/22/2020   CHOL 262 (H) 12/20/2020   TRIG 165.0 (H)  12/20/2020   HDL 76.20 12/20/2020   LDLDIRECT 123.0 10/26/2014   LDLCALC 153 (H) 12/20/2020   ALT 22 11/22/2020   AST 24 11/22/2020   NA 138 11/22/2020   K 3.6 11/30/2020   CL 99 11/22/2020   CREATININE 1.18 11/22/2020   BUN 21 11/22/2020   CO2 30 11/22/2020   TSH 1.189 10/07/2008   INR 0.97 09/25/2013   HGBA1C 6.0 12/20/2020   MICROALBUR 4.1 (H) 12/20/2020    Assessment/Plan:  Dental abscess  - Plan: amoxicillin (AMOXIL) 500 MG capsule  Facial edema  Dental caries  Facial abscess causing edema and facial pain 2/2 dental caries.  Temperature this visit 99.110F with slight elevation in BP.  Does not currently meet criteria for sepsis.  Will start amoxicillin twice daily.  Discussed supportive care including Tylenol or NSAIDs, gargling with warm salt water as needed.  Patient encouraged to keep upcoming dental appointment as extractions needed.  Given strict precautions for increased or worsening symptoms.  Given handout.  F/u prn  Grier Mitts, MD

## 2021-02-08 ENCOUNTER — Ambulatory Visit (INDEPENDENT_AMBULATORY_CARE_PROVIDER_SITE_OTHER): Payer: BC Managed Care – PPO | Admitting: Neurology

## 2021-02-08 ENCOUNTER — Encounter: Payer: Self-pay | Admitting: Neurology

## 2021-02-08 ENCOUNTER — Other Ambulatory Visit: Payer: Self-pay

## 2021-02-08 VITALS — BP 138/86 | HR 87 | Ht 64.0 in | Wt 170.1 lb

## 2021-02-08 DIAGNOSIS — T451X5A Adverse effect of antineoplastic and immunosuppressive drugs, initial encounter: Secondary | ICD-10-CM

## 2021-02-08 DIAGNOSIS — G43009 Migraine without aura, not intractable, without status migrainosus: Secondary | ICD-10-CM

## 2021-02-08 DIAGNOSIS — G620 Drug-induced polyneuropathy: Secondary | ICD-10-CM

## 2021-02-08 MED ORDER — SUMATRIPTAN SUCCINATE 25 MG PO TABS: 25.0000 mg | ORAL_TABLET | ORAL | 11 refills | Status: DC | PRN

## 2021-02-08 MED ORDER — GABAPENTIN 600 MG PO TABS: 600.0000 mg | ORAL_TABLET | Freq: Three times a day (TID) | ORAL | 3 refills | Status: DC

## 2021-02-08 NOTE — Progress Notes (Signed)
NEUROLOGY CONSULTATION NOTE  Tina [name] MRN: [id number] DOB: [birth year]  Referring provider:Lindsay Etheleen [name] Primary care provider: Dr. Betty Martinique  Reason for consult:  headaches  Thank you for your kind referral of Tina [name] for consultation of the above symptoms. Although her history is well known to you, please allow me to reiterate it for the purpose of our medical record. She is alone in the office today. Records and images were personally reviewed where available.   HISTORY OF PRESENT ILLNESS: This is a very pleasant 60 year old right-handed woman with a history of breast cancer s/p radiation and chemotherapy with neuropathy due to chemotherapy, hypertension, previously seen in our office from 2016 to 2019 for headaches. In 2016 she presented with a different kind of headache with pain on the right hemisphere radiating down the right side of her face and neck. There was occasional tingling in her scalp. There would be nausea/vomiting. Brain MRI in 2016 was normal. Cervical MRI showed multilevel degenerative changes with central disc herniation, no cord edema. Headaches were felt due to be cervicogenic in nature, improved with physical therapy and gabapentin. She started having left-sided facial pain in June 2019 reporting stabbing pain on the left facial area and ear/neck. Brain MRI/trigeminal protocol in 02/2018 was normal. Gabapentin was increased to 600mg  TID. She was lost to follow-up for 3 years and reports that the symptoms overall quieted down and she was taking the Gabapentin 600mg  TID more for the neuropathy, noticing that when she misses a dose, she feels symptoms in her feet. When she takes it regularly, feet and hands feel good with no symptoms. She denies any further neck pain. She has had a couple of episodes of left-sided facial pain since her last visit lasting for a week. She feels her teeth may also have contributed, she had teeth pulled recently. She called our  office last April 2022 to report facial pain and headache for 2 weeks, taking 3-4 Tylenol daily. It would start as a muscle spasm then pain and headache starts. She went to the ER where head CT did not show any acute changes. She was given a migraine cocktail with significant relief in symptoms. She reports that she then found out she had COVID and that when she got better from [place], the headaches resolved with no recurrence in the past 3 months. She has prn sumatriptan and has not needed it since April. She has a nagging headache every now and then. She reports an episode of brief loss of consciousness 2 weeks ago, she was exhausted, doing a lot of running around and a little stressed out. She was sitting on the porch then stood up and everything got bright. Her husband tried to catch her, she was out very briefly and when she woke up, BP was extremely low. She exercises regularly. She denies any dizziness, diplopia, dysarthria/dysphagia. Sleep is good.    PAST MEDICAL HISTORY: Past Medical History:  Diagnosis Date   Anxiety    Breast cancer ([place]) 2015   Right Breast Cancer   Cancer of right breast ([place]) 09/03/13   Invasive Ductal Carcinoma/Ductal Carcinoma Insitu  biopsies   Chronic low back pain 01/24/2016   -and R hip pain -seen by rheumatologist and ortho in the past -MRI with Dr. Lorin Mercy in 2015, R hip plain films in 2015 as well acupuncture, injections, tramadol not helpful trigger therapy at [facility] and PT helpful    Degenerative joint disease    back neck and  shoulder   Fibromyalgia    Dr. Estanislado Pandy   GERD (gastroesophageal reflux disease)    hx esophagitis   Hot flashes    Hypertension    Leukopenia 06/16/2013   Lupus [facility]    rheumatologist - Dr. Estanislado Pandy   Migraines    Neuromuscular disorder 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   Personal history of chemotherapy    2015   Personal history of radiation therapy 2015   Right Breast Cancer   S/P radiation  therapy 04/27/2014-06/07/2014   1) Right breast, 50 Gy in 25 fractions/ 2) Right breast boost, 10 Gy in 5 fractions    PAST SURGICAL HISTORY: Past Surgical History:  Procedure Laterality Date   BREAST BIOPSY Left 2015   benign core   BREAST BIOPSY Right 2015   malignant core    BREAST LUMPECTOMY Right 2015   BREAST SURGERY  03/01/2014   hx right breast cancer- 3 lymph nodes removed and lumpectomy   CARPAL TUNNEL RELEASE  1998   rt   CESAREAN SECTION     x 3   PORTACATH PLACEMENT  08/2013   still currently active    ROBOTIC ASSISTED BILATERAL SALPINGO OOPHERECTOMY Bilateral 07/09/2014   Procedure: ROBOTIC ASSISTED BILATERAL SALPINGO OOPHORECTOM; :LYSIS OF ADHESIONS;  Surgeon: Marvene Staff, MD;  Location: [facility];  Service: Gynecology;  Laterality: Bilateral;   TRIGGER FINGER RELEASE  2004   rt thumb   trigger finger release left thumb  06/28/14   at orthopedic surgical center   [place]: Current Outpatient Medications on File Prior to Visit  Medication Sig Dispense Refill   albuterol (VENTOLIN HFA) 108 (90 Base) MCG/ACT inhaler Inhale 2 puffs into the lungs every 6 (six) hours as needed for wheezing or shortness of breath. 8 g 0   aspirin 81 MG tablet Take 81 mg by mouth daily.     b complex vitamins capsule Take 1 capsule by mouth daily.     BIOTIN 5000 PO Take 1 capsule by mouth. Takes sometimes     Calcium Carbonate-Vit D-Min (CALTRATE 600+D PLUS MINERALS PO) Take 1 tablet by mouth daily.     gabapentin (NEURONTIN) 600 MG tablet TAKE 1 TABLET BY MOUTH THREE TIMES DAILY 90 tablet 0   lisinopril-hydrochlorothiazide (ZESTORETIC) 20-12.5 MG tablet Take 1 tablet by mouth daily. 90 tablet 3   loratadine (CLARITIN) 10 MG tablet Take 10 mg by mouth as needed.     MAGNESIUM PO Take by mouth daily.     omeprazole (PRILOSEC) 40 MG capsule TAKE 1 CAPSULE BY MOUTH ONCE DAILY BEFORE BREAKFAST 90 capsule 2   Propylene Glycol (SYSTANE  COMPLETE OP) Apply to eye.     SUMAtriptan (IMITREX) 25 MG tablet Take 1 tablet (25 mg total) by mouth every 2 (two) hours as needed for migraine. May repeat in 2 hours if needed. Do not take more then 2 tablets in 24 hours. 10 tablet 0   TURMERIC PO Take by mouth daily.     hydroxychloroquine (PLAQUENIL) 200 MG tablet Take 1 tablet by mouth once daily (Patient not taking: Reported on 02/08/2021) 90 tablet 0   venlafaxine XR (EFFEXOR-XR) 150 MG 24 hr capsule Take 1 capsule by mouth once daily (Patient not taking: Reported on 02/08/2021) 90 capsule 0   Current Facility-Administered Medications on File Prior to Visit  Medication Dose Route Frequency Provider Last Rate Last Admin   prochlorperazine (  COMPAZINE) injection 10 mg  10 mg Intravenous Q6H PRN Gardenia Phlegm, NP   10 mg at 12/26/13 2423    ALLERGIES: Allergies  Allergen Reactions   Adhesive [Tape] Itching    Burned and scarred skinned   Other     Cholroprep-causes rash   Nickel Rash    FAMILY HISTORY: Family History  Problem Relation Age of Onset   Hypertension Mother    Dementia Mother        small vessel disease   Cancer Mother 59       cervical  cancer   Cancer Father 42       prostate ca   Diabetes Father    Arthritis Father    Cancer Half-Sister    Breast cancer Neg Hx     SOCIAL HISTORY: Social History   Socioeconomic History   Marital status: Married    Spouse name: Not on file   Number of children: 1   Years of education: Not on file   Highest education level: Not on file  Occupational History    Comment: homemaker  Tobacco Use   Smoking status: Former    Packs/day: 1.00    Years: 15.00    Pack years: 15.00    Types: Cigars, Cigarettes    Quit date: 02/23/2004    Years since quitting: 16.9   Smokeless tobacco: Never   Tobacco comments:    remote smoking history  Vaping Use   Vaping Use: Never used  Substance and Sexual Activity   Alcohol use: Yes    Alcohol/week: 0.0 standard drinks     Comment: rare   Drug use: Yes    Types: Marijuana    Comment: Marijuana as needed    Sexual activity: Yes  Other Topics Concern   Not on file  Social History Narrative   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      Home Situation: lives with husband and 84 yo son      Spiritual Beliefs: Christian      Lifestyle: elliptical 3-4 times per week; working on diet as well over last year in 2014            Social Determinants of Health   Financial Resource Strain: Low Risk    Difficulty of Paying Living Expenses: Not hard at all  Food Insecurity: No Food Insecurity   Worried About Charity fundraiser in the Last Year: Never true   Arboriculturist in the Last Year: Never true  Transportation Needs: No Transportation Needs   Lack of Transportation (Medical): No   Lack of Transportation (Non-Medical): No  Physical Activity: Sufficiently Active   Days of Exercise per Week: 5 days   Minutes of Exercise per Session: 30 min  Stress: Stress Concern Present   Feeling of Stress : To some extent  Social Connections: Engineer, building services of Communication with Friends and Family: More than three times a week   Frequency of Social Gatherings with Friends and Family: More than three times a week   Attends Religious Services: More than 4 times per year   Active Member of [employer] or Organizations: Yes   Attends Archivist Meetings: 1 to 4 times per year   Marital Status: Married  Human resources officer Violence: Not At Risk   Fear of Current or Ex-Partner: No   Emotionally Abused: No   Physically Abused: No   Sexually Abused: No     PHYSICAL EXAM: Vitals:  02/08/21 0908  BP: 138/86  Pulse: 87  SpO2: 98%   General: No acute distress Head:  Normocephalic/atraumatic Skin/Extremities: No rash, no edema Neurological Exam: Mental status: alert and oriented to person, place, and time, no dysarthria or aphasia, Fund of knowledge is appropriate.  Recent and remote memory are intact.   Attention and concentration are normal.    Cranial nerves: CN I: not tested CN II: pupils equal, round and reactive to light, visual fields intact CN III, IV, VI:  full range of motion, no nystagmus, no ptosis CN V: facial sensation intact CN VII: upper and lower face symmetric CN VIII: hearing intact to conversation CN IX, X: gag intact, uvula midline CN XI: sternocleidomastoid and trapezius muscles intact CN XII: tongue midline Bulk & Tone: normal, no fasciculations. Motor: 5/5 throughout with no pronator drift. Sensation: intact to light touch, cold, pin on both UE, intact cold, pin on both LE, decreased vibration sense to left ankle. Romberg test negative Deep Tendon Reflexes: +2 throughout except for absent ankle jerks bilaterally Cerebellar: no incoordination on finger to nose testing Gait: narrow-based and steady, able to tandem walk adequately Tremor: none   IMPRESSION: This is a very pleasant 60 year old right-handed woman with a history of breast cancer s/p radiation and chemotherapy with neuropathy due to chemotherapy, hypertension, previously seen in our office from 2016 to 2019 for headaches. She presents today after a bout of significant headaches last April in the setting of COVID. Headaches have improved, she has occasional stabbing headaches and prn sumatriptan. She also takes the gabapentin 600mg  TID for neuropathy. Refills sent. Follow-up in 6-8 months, call for any changes.    Thank you for allowing me to participate in the care of this patient. Please do not hesitate to call for any questions or concerns.   Ellouise Newer, M.D.  CC: Dr. Martinique

## 2021-02-08 NOTE — Patient Instructions (Signed)
Good to see you again!  Refills for Gabapentin 60mg  three times a day and as needed sumatriptan have been sent to your pharmacy  2. Follow-up in 6-8 months, call for any changes

## 2021-02-16 ENCOUNTER — Ambulatory Visit: Payer: BC Managed Care – PPO | Admitting: Neurology

## 2021-02-22 ENCOUNTER — Ambulatory Visit
Admission: RE | Admit: 2021-02-22 | Discharge: 2021-02-22 | Disposition: A | Discharge status: Home / Self Care | Payer: BC Managed Care – PPO | Source: Ambulatory Visit | Attending: Obstetrics and Gynecology | Admitting: Obstetrics and Gynecology

## 2021-02-22 ENCOUNTER — Other Ambulatory Visit: Payer: Self-pay

## 2021-02-22 DIAGNOSIS — Z1231 Encounter for screening mammogram for malignant neoplasm of breast: Secondary | ICD-10-CM | POA: Diagnosis not present

## 2021-03-09 ENCOUNTER — Telehealth: Payer: Self-pay | Admitting: Family Medicine

## 2021-03-09 NOTE — Telephone Encounter (Signed)
Patient called today asking if Dr. Martinique would take her son Tina [name] on as a new patient.  She states that her son is having some issues with his stomach that has been going on for some time. She states that she wants him to be a patient of a good doctor that will get him the care that he needs for his stomach.  Patient's contact number is [phone] and would like a call to schedule if Dr. Martinique agrees to take Tina [name] on as a new patient, or if she cannot due to a full patient panel.  Please advise.

## 2021-03-10 NOTE — Telephone Encounter (Signed)
Okay to schedule pt's son.

## 2021-03-14 NOTE — Telephone Encounter (Signed)
Called patient to get her son scheduled as new patient with Dr. Martinique.  No answer, left voicemail message.  Please schedule her son for a new patient appointment with Dr. Martinique when she calls back.

## 2021-03-20 DIAGNOSIS — M3210 Systemic lupus erythematosus, organ or system involvement unspecified: Secondary | ICD-10-CM | POA: Diagnosis not present

## 2021-03-20 DIAGNOSIS — Z79899 Other long term (current) drug therapy: Secondary | ICD-10-CM | POA: Diagnosis not present

## 2021-03-20 LAB — HM DIABETES EYE EXAM

## 2021-03-30 NOTE — Progress Notes (Signed)
Office Visit Note  Patient: Tina [name]             Date of Birth: [birth year]           MRN: [id number]             PCP: Martinique, Betty G, MD Referring: Martinique, Betty G, MD Visit Date: 04/13/2021 Occupation: _0 @  Subjective:  Medication monitoring    History of Present Illness: Tina [name] is a 60 y.o. female with history of autoimmune disease and osteoarthritis.  According to the patient she was diagnosed with COVID-19 in April 2022.  She states that during that time she was having a lot of GI symptoms so she stopped a lot of her medications.  She states that she stopped Plaquenil in April and has not restarted yet.  She was previously tolerating Plaquenil without any side effects.  She denies any signs or symptoms of a flare.  She has not had any recent rashes, hair loss, oral or nasal ulcerations, sicca symptoms, increased fatigue, swollen lymph nodes, shortness of breath, palpitations, or pleuritic chest pain.  She has not had any symptoms of Raynaud's recently.  She continues to experience joint stiffness particularly in the evenings.  She has not had any nocturnal pain.  She denies any joint swelling.  She states that overall she has been sleeping well at night.  She is also been walking 2-2.5 miles daily for exercise.   She denies any other new medical conditions. She has been practicing meditation and other self-care techniques as well which have been beneficial.        Activities of Daily Living:  Patient reports joint stiffness all day  Patient Denies nocturnal pain.  Difficulty dressing/grooming: Denies Difficulty climbing stairs: Reports Difficulty getting out of chair: Reports Difficulty using hands for taps, buttons, cutlery, and/or writing: Denies  Review of Systems  Constitutional:  Negative for fatigue.  HENT:  Negative for mouth sores, mouth dryness and nose dryness.   Eyes:  Negative for pain, visual disturbance and dryness.  Respiratory:  Negative for  cough, hemoptysis, shortness of breath and difficulty breathing.   Cardiovascular:  Negative for chest pain, palpitations, hypertension and swelling in legs/feet.  Gastrointestinal:  Negative for blood in stool, constipation and diarrhea.  Endocrine: Negative for increased urination.  Genitourinary:  Negative for painful urination.  Musculoskeletal:  Positive for morning stiffness. Negative for joint pain, joint pain, joint swelling, myalgias, muscle weakness, muscle tenderness and myalgias.  Skin:  Negative for color change, pallor, rash, hair loss, nodules/bumps, skin tightness, ulcers and sensitivity to sunlight.  Allergic/Immunologic: Negative for susceptible to infections.  Neurological:  Negative for dizziness, numbness, headaches and weakness.  Hematological:  Negative for swollen glands.  Psychiatric/Behavioral:  Negative for depressed mood and sleep disturbance. The patient is nervous/anxious.    PMFS History:  Patient Active Problem List   Diagnosis Date Noted   Controlled type 2 diabetes mellitus with complication, without long-term current use of insulin ([place]) 05/07/2019   Hyperlipidemia 05/05/2019   Obesity (BMI 30-39.9) 05/05/2019   Fibromyalgia 02/28/2016   Chronic low back pain 01/24/2016   Lupus ([place]) 04/19/2015   GERD (gastroesophageal reflux disease) 04/19/2015   Cervicogenic headache 02/10/2015   Neck pain 02/10/2015   Hot flashes 10/26/2014   GAD (generalized anxiety disorder) 10/26/2014   Peripheral neuropathy due to chemotherapy ([place]) 03/12/2014   Breast cancer of lower-outer quadrant of right female breast ([place]) 09/15/2013   Left shoulder pain - seeing Dr. Lorin Mercy 08/20/2013  Hypertension     Past Medical History:  Diagnosis Date   Anxiety    Breast cancer ([place]) 2015   Right Breast Cancer   Cancer of right breast ([place]) 09/03/13   Invasive Ductal Carcinoma/Ductal Carcinoma Insitu  biopsies   Chronic low back pain 01/24/2016   -and R hip pain -seen by  rheumatologist and ortho in the past -MRI with Dr. Lorin Mercy in 2015, R hip plain films in 2015 as well acupuncture, injections, tramadol not helpful trigger [facility] and PT helpful    Degenerative joint disease    back neck and shoulder   Fibromyalgia    Dr. Estanislado Pandy   GERD (gastroesophageal reflux disease)    hx esophagitis   Hot flashes    Hypertension    Leukopenia 06/16/2013   Lupus [facility])    rheumatologist - Dr. Estanislado Pandy   Migraines    Neuromuscular disorder [facility])    neuropathy from chemo in feet/hands   Personal history of chemotherapy    2015   Personal history of radiation therapy 2015   Right Breast Cancer   S/P radiation therapy 04/27/2014-06/07/2014   1) Right breast, 50 Gy in 25 fractions/ 2) Right breast boost, 10 Gy in 5 fractions    Family History  Problem Relation Age of Onset   Hypertension Mother    Dementia Mother        small vessel disease   Cancer Mother 25       cervical  cancer   Cancer Father 69       prostate ca   Diabetes Father    Arthritis Father    Cancer Half-Sister    Breast cancer Neg Hx    Past Surgical History:  Procedure Laterality Date   BREAST BIOPSY Left 2015   benign core   BREAST BIOPSY Right 2015   malignant core    BREAST LUMPECTOMY Right 2015   BREAST SURGERY  03/01/2014   hx right breast cancer- 3 lymph nodes removed and lumpectomy   CARPAL TUNNEL RELEASE  1998   rt   CESAREAN SECTION     x 3   PORTACATH PLACEMENT  08/2013   still currently active    ROBOTIC ASSISTED BILATERAL SALPINGO OOPHERECTOMY Bilateral 07/09/2014   Procedure: ROBOTIC ASSISTED BILATERAL SALPINGO OOPHORECTOM; :LYSIS OF ADHESIONS;  Surgeon: Marvene Staff, MD;  Location: [facility];  Service: Gynecology;  Laterality: Bilateral;   TRIGGER FINGER RELEASE  2004   rt thumb   trigger finger release left thumb  06/28/14   at orthopedic surgical center   [place] EXTRACTION     Social History   Social History  Narrative   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      Home Situation: lives with husband and 58 yo son      Spiritual Beliefs: Christian      Lifestyle: elliptical 3-4 times per week; working on diet as well over last year in 2014            Immunization History  Administered Date(s) Administered   Influenza Split 06/16/2012   Influenza,inj,Quad PF,6+ Mos 06/17/2013, 04/23/2014, 04/25/2015, 05/17/2017, 05/05/2019   PFIZER(Purple Top)SARS-COV-2 Vaccination 10/09/2019, 11/02/2019   Pneumococcal Polysaccharide-23 02/16/2015     Objective: Vital Signs: BP 135/83 (BP Location: Left Arm, Patient Position: Sitting, Cuff Size: Normal)   Pulse 87   Ht _0  (1.626 m)   Wt 172 lb (78 kg)   LMP 09/28/2013 (Approximate)  BMI 29.52 kg/m    Physical Exam Vitals and nursing note reviewed.  Constitutional:      Appearance: She is well-developed.  HENT:     Head: Normocephalic and atraumatic.  Eyes:     Conjunctiva/sclera: Conjunctivae normal.  Cardiovascular:     Rate and Rhythm: Normal rate and regular rhythm.     Heart sounds: Normal heart sounds.  Pulmonary:     Effort: Pulmonary effort is normal.     Breath sounds: Normal breath sounds.  Abdominal:     General: Bowel sounds are normal.     Palpations: Abdomen is soft.  Musculoskeletal:     Cervical back: Normal range of motion.  Lymphadenopathy:     Cervical: No cervical adenopathy.  Skin:    General: Skin is warm and dry.     Capillary Refill: Capillary refill takes less than 2 seconds.     Comments: No malar rash No digital ulcerations or signs of gangrene  Neurological:     Mental Status: She is alert and oriented to person, place, and time.  Psychiatric:        Behavior: Behavior normal.     Musculoskeletal Exam: C-spine, thoracic spine, and lumbar spine good ROM.  Shoulder joints, elbow joints, wrist joints, MCPs, PIPs, DIPs have good range of motion with no synovitis.  Complete fist formation bilaterally.  Hip joints  have good range of motion with no discomfort.  No tenderness over trochanteric bursa bilaterally.  Knee joints have good range of motion with no warmth or effusion.  Ankle joints have good range of motion with no tenderness or inflammation.  No tenderness over MTP joints.  CDAI Exam: CDAI Score: -- Patient Global: --; Provider Global: -- Swollen: --; Tender: -- Joint Exam 04/13/2021   No joint exam has been documented for this visit   There is currently no information documented on the homunculus. Go to the Rheumatology activity and complete the homunculus joint exam.  Investigation: No additional findings.  Imaging: No results found.  Recent Labs: Lab Results  Component Value Date   WBC 3.1 (L) 11/22/2020   HGB 14.6 11/22/2020   PLT 221.0 11/22/2020   NA 138 11/22/2020   K 3.6 11/30/2020   CL 99 11/22/2020   CO2 30 11/22/2020   GLUCOSE 108 (H) 11/22/2020   BUN 21 11/22/2020   CREATININE 1.18 11/22/2020   BILITOT 0.9 11/22/2020   ALKPHOS 50 11/22/2020   AST 24 11/22/2020   ALT 22 11/22/2020   PROT 8.6 (H) 11/22/2020   ALBUMIN 4.4 11/22/2020   CALCIUM 9.9 11/22/2020   GFRAA 64 11/03/2020    Speciality Comments: PLQ eye exam: 03/20/2021 WNL [facility]. Follow up in 6 months.  Procedures:  No procedures performed Allergies: Adhesive [tape], Other, and Nickel   Assessment / Plan:     Visit Diagnoses: Autoimmune disease ([place]) - + ANA 1:320 Speckled-patient had past Hx of neutropenia, fatigue, oral ulcers, sicca symptoms, parotitis, positive anti-CCP: She has not had any signs or symptoms of an autoimmune disease flare recently.  She has been off of Plaquenil since April 2022 since being diagnosed with COVID-19.  At that time she was having GI symptoms which was making it difficult for her to take her daily medications but she has not resumed PLQ yet.  Discussed the importance of remaining compliant taking Plaquenil as prescribed.  She has not been  experiencing any increased joint pain but has joint stiffness on a daily basis.  No  synovitis was noted on examination today.  She has not had any recent rashes, Raynaud's, hair loss, oral or nasal ulcerations, sicca symptoms, cervical lymphadenopathy, shortness of breath, pleuritic chest pain, or palpitations.   No malar rash or signs of alopecia were noted.  Her lungs were clear to auscultation on examination today.  Lab work from 11/03/2020 was reviewed today in the office: ESR within normal limits, complements within normal limits, double-stranded DNA negative, and protein creatinine ratio WNL.  She was advised to restart on Plaquenil 200 mg 1 tablet by mouth daily.  She was advised to notify us if she develops signs or symptoms of a flare.  She will follow-up in the office in 5 months.  High risk medication use - Plaquenil 200 mg 1 tablet by mouth daily. PLQ eye exam: 03/20/2021.  CBC and CMP updated on 11/22/20.  CBC and CMP will be drawn today to monitor for drug toxicity.  Cyclic citrullinated peptide (CCP) antibody positive: She has no clinical features of rheumatoid arthritis.  No joint tenderness or synovitis was noted.  Primary osteoarthritis of both hands: She has PIP and DIP thickening consistent with osteoarthritis of both hands.  Discussed the importance of joint protection and muscle strengthening.  Primary osteoarthritis of both hips: She has good range of motion of both hip joints with no discomfort at this time.  She has not been experiencing any groin pain recently.  Primary osteoarthritis of both feet: She is not experiencing a discomfort in her feet at this time.  She is good range of motion of both ankle joints with no tenderness or inflammation.  She has been walking 2-2.5 miles daily for exercise.   DDD (degenerative disc disease), cervical: She has good range of motion with no discomfort at this time.  Trapezius muscle spasm: Resolved  Cervicogenic headache: Resolved  Chronic  right SI joint pain: She is not having SI joint pain at this time.   Fibromyalgia: She has not had any recent febrile flares.  She is not experiencing any tender points or generalized hyperalgesia at this time.  She has not had any increased fatigue and has been sleeping well at night.  Osteopenia, unspecified location - DXA done by her GYN on 02/08/2020 showed a T score of -1.6, BMD 0.979 in the lumbar region.    Other medical conditions are listed as follows:   Essential hypertension: BP was 135/83 today in the office.   Malignant neoplasm of lower-outer quadrant of right breast of female, estrogen receptor positive ([place])  Peripheral neuropathy due to chemotherapy ([place]) - Followed by Dr.Aquino  Orders: Orders Placed This Encounter  Procedures   Protein / creatinine ratio, urine   CBC with Differential/Platelet   COMPLETE METABOLIC PANEL WITH GFR   Anti-DNA antibody, double-stranded   C3 and C4   Sedimentation rate   ANA    No orders of the defined types were placed in this encounter.    Follow-Up Instructions: Return in about 5 months (around 09/13/2021) for Autoimmune Disease, Osteoarthritis.   Ofilia Neas, PA-C  Note - This record has been created using Dragon software.  Chart creation errors have been sought, but may not always  have been located. Such creation errors do not reflect on  the standard of medical care.

## 2021-04-13 ENCOUNTER — Other Ambulatory Visit: Payer: Self-pay

## 2021-04-13 ENCOUNTER — Encounter: Payer: Self-pay | Admitting: Physician Assistant

## 2021-04-13 ENCOUNTER — Ambulatory Visit (INDEPENDENT_AMBULATORY_CARE_PROVIDER_SITE_OTHER): Payer: BC Managed Care – PPO | Admitting: Physician Assistant

## 2021-04-13 VITALS — BP 135/83 | HR 87 | Ht 64.0 in | Wt 172.0 lb

## 2021-04-13 DIAGNOSIS — Z79899 Other long term (current) drug therapy: Secondary | ICD-10-CM

## 2021-04-13 DIAGNOSIS — M160 Bilateral primary osteoarthritis of hip: Secondary | ICD-10-CM | POA: Diagnosis not present

## 2021-04-13 DIAGNOSIS — M19072 Primary osteoarthritis, left ankle and foot: Secondary | ICD-10-CM

## 2021-04-13 DIAGNOSIS — M8580 Other specified disorders of bone density and structure, unspecified site: Secondary | ICD-10-CM | POA: Diagnosis not present

## 2021-04-13 DIAGNOSIS — R7989 Other specified abnormal findings of blood chemistry: Secondary | ICD-10-CM

## 2021-04-13 DIAGNOSIS — M19041 Primary osteoarthritis, right hand: Secondary | ICD-10-CM

## 2021-04-13 DIAGNOSIS — M19042 Primary osteoarthritis, left hand: Secondary | ICD-10-CM

## 2021-04-13 DIAGNOSIS — M19071 Primary osteoarthritis, right ankle and foot: Secondary | ICD-10-CM | POA: Diagnosis not present

## 2021-04-13 DIAGNOSIS — M797 Fibromyalgia: Secondary | ICD-10-CM | POA: Diagnosis not present

## 2021-04-13 DIAGNOSIS — M62838 Other muscle spasm: Secondary | ICD-10-CM | POA: Diagnosis not present

## 2021-04-13 DIAGNOSIS — I10 Essential (primary) hypertension: Secondary | ICD-10-CM

## 2021-04-13 DIAGNOSIS — M5030 Other cervical disc degeneration, unspecified cervical region: Secondary | ICD-10-CM

## 2021-04-13 DIAGNOSIS — R768 Other specified abnormal immunological findings in serum: Secondary | ICD-10-CM

## 2021-04-13 DIAGNOSIS — C50511 Malignant neoplasm of lower-outer quadrant of right female breast: Secondary | ICD-10-CM

## 2021-04-13 DIAGNOSIS — G8929 Other chronic pain: Secondary | ICD-10-CM

## 2021-04-13 DIAGNOSIS — G4486 Cervicogenic headache: Secondary | ICD-10-CM

## 2021-04-13 DIAGNOSIS — T451X5A Adverse effect of antineoplastic and immunosuppressive drugs, initial encounter: Secondary | ICD-10-CM

## 2021-04-13 DIAGNOSIS — M359 Systemic involvement of connective tissue, unspecified: Secondary | ICD-10-CM | POA: Diagnosis not present

## 2021-04-13 DIAGNOSIS — G620 Drug-induced polyneuropathy: Secondary | ICD-10-CM

## 2021-04-13 DIAGNOSIS — M533 Sacrococcygeal disorders, not elsewhere classified: Secondary | ICD-10-CM | POA: Diagnosis not present

## 2021-04-13 DIAGNOSIS — Z170 Estrogen receptor positive status [ER+]: Secondary | ICD-10-CM

## 2021-04-14 NOTE — Progress Notes (Signed)
CBC and CMP WNL.  ESR and complements WNL.

## 2021-04-18 ENCOUNTER — Other Ambulatory Visit: Payer: Self-pay | Admitting: *Deleted

## 2021-04-18 LAB — CBC WITH DIFFERENTIAL/PLATELET
Absolute Monocytes: 234 {cells}/uL (ref 200–950)
Basophils Absolute: 29 {cells}/uL (ref 0–200)
Basophils Relative: 0.7 %
Eosinophils Absolute: 29 {cells}/uL (ref 15–500)
Eosinophils Relative: 0.7 %
HCT: 41.9 % (ref 35.0–45.0)
Hemoglobin: 13.8 g/dL (ref 11.7–15.5)
Lymphs Abs: 2042 {cells}/uL (ref 850–3900)
MCH: 30.4 pg (ref 27.0–33.0)
MCHC: 32.9 g/dL (ref 32.0–36.0)
MCV: 92.3 fL (ref 80.0–100.0)
MPV: 10.2 fL (ref 7.5–12.5)
Monocytes Relative: 5.7 %
Neutro Abs: 1767 {cells}/uL (ref 1500–7800)
Neutrophils Relative %: 43.1 %
Platelets: 248 Thousand/uL (ref 140–400)
RBC: 4.54 Million/uL (ref 3.80–5.10)
RDW: 12.5 % (ref 11.0–15.0)
Total Lymphocyte: 49.8 %
WBC: 4.1 Thousand/uL (ref 3.8–10.8)

## 2021-04-18 LAB — COMPLETE METABOLIC PANEL WITH GFR
AG Ratio: 1.3 (calc) (ref 1.0–2.5)
ALT: 19 U/L (ref 6–29)
AST: 23 U/L (ref 10–35)
Albumin: 4.4 g/dL (ref 3.6–5.1)
Alkaline phosphatase (APISO): 77 U/L (ref 37–153)
BUN: 13 mg/dL (ref 7–25)
CO2: 31 mmol/L (ref 20–32)
Calcium: 10 mg/dL (ref 8.6–10.4)
Chloride: 101 mmol/L (ref 98–110)
Creat: 0.97 mg/dL (ref 0.50–1.03)
Globulin: 3.4 g/dL (calc) (ref 1.9–3.7)
Glucose, Bld: 102 mg/dL — ABNORMAL HIGH (ref 65–99)
Potassium: 3.7 mmol/L (ref 3.5–5.3)
Sodium: 140 mmol/L (ref 135–146)
Total Bilirubin: 0.9 mg/dL (ref 0.2–1.2)
Total Protein: 7.8 g/dL (ref 6.1–8.1)
eGFR: 68 mL/min/{1.73_m2} (ref >=60)

## 2021-04-18 LAB — SEDIMENTATION RATE: Sed Rate: 17 mm/h (ref 0–30)

## 2021-04-18 LAB — ANTI-NUCLEAR AB-TITER (ANA TITER)
ANA TITER: 1:40 {titer} — ABNORMAL HIGH
ANA Titer 1: 1:40 {titer} — ABNORMAL HIGH

## 2021-04-18 LAB — C3 AND C4
C3 Complement: 153 mg/dL (ref 83–193)
C4 Complement: 22 mg/dL (ref 15–57)

## 2021-04-18 LAB — ANA: Anti Nuclear Antibody (ANA): POSITIVE — AB

## 2021-04-18 LAB — ANTI-DNA ANTIBODY, DOUBLE-STRANDED: ds DNA Ab: 9 [IU]/mL — ABNORMAL HIGH

## 2021-04-18 LAB — PROTEIN / CREATININE RATIO, URINE
Creatinine, Urine: 37 mg/dL (ref 20–275)
Protein/Creat Ratio: 81 mg/g creat (ref 24–184)
Protein/Creatinine Ratio: 0.081 mg/mg creat (ref 0.024–0.184)
Total Protein, Urine: 3 mg/dL — ABNORMAL LOW (ref 5–24)

## 2021-04-18 MED ORDER — HYDROXYCHLOROQUINE SULFATE 200 MG PO TABS: 200.0000 mg | ORAL_TABLET | Freq: Every day | ORAL | 0 refills | Status: DC

## 2021-04-18 NOTE — Telephone Encounter (Signed)
Next Visit: 09/14/2021  Last Visit: 04/13/2021  Labs: 04/13/2021, CBC and CMP WNL.  ESR and complements WNL, dsDNA is 9.  ANA remains positive but is low titer.   No proteinuria.   Eye exam: 03/20/2021 WNL    Current Dose per office note 04/13/2021: restart on Plaquenil 200 mg 1 tablet by mouth daily  TM:[id number] disease   Last Fill: 10/13/2020  Okay to refill Plaquenil?

## 2021-04-18 NOTE — Progress Notes (Signed)
dsDNA is 9.  ANA remains positive but is low titer.   No proteinuria.  The patient was advised at her last appointment to restart taking plaquenil.

## 2021-06-07 ENCOUNTER — Telehealth: Payer: Self-pay | Admitting: Rheumatology

## 2021-06-07 NOTE — Telephone Encounter (Signed)
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 Patient has a question. Please call to discuss.

## 2021-06-07 NOTE — Telephone Encounter (Signed)
It is not documented in our records that she was using CBD oil or hemp oil.  She may contact her PCP to write the letter.

## 2021-06-07 NOTE — Telephone Encounter (Signed)
Patient advised it is not documented in our records that she was using CBD oil or hemp oil.  Patient advised she may contact her PCP to write the letter.

## 2021-06-07 NOTE — Telephone Encounter (Signed)
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 Patient has a question. Please call to discuss.  Volunteers at [employer]. Patient states she has been using CBD oil and hemp oil. Patient denies using the CBD gummies. Patient states she did not pass the drug test due to using this. Patient states she has spoken with the head of the volunteer program and is requesting a letter stating that you are aware she is using these products to help with her pain due to the Autoimmune disease, Fibromyalgia, DDD (degenerative disc disease), cervical. Please advise.

## 2021-06-30 ENCOUNTER — Telehealth: Payer: Self-pay | Admitting: Family Medicine

## 2021-06-30 ENCOUNTER — Encounter: Payer: Self-pay | Admitting: Family Medicine

## 2021-06-30 ENCOUNTER — Ambulatory Visit (INDEPENDENT_AMBULATORY_CARE_PROVIDER_SITE_OTHER): Payer: BC Managed Care – PPO | Admitting: Family Medicine

## 2021-06-30 VITALS — BP 132/84 | HR 78 | Temp 98.9°F | Resp 16 | Ht 64.0 in | Wt 176.6 lb

## 2021-06-30 DIAGNOSIS — E785 Hyperlipidemia, unspecified: Secondary | ICD-10-CM

## 2021-06-30 DIAGNOSIS — Z23 Encounter for immunization: Secondary | ICD-10-CM

## 2021-06-30 DIAGNOSIS — I10 Essential (primary) hypertension: Secondary | ICD-10-CM

## 2021-06-30 DIAGNOSIS — E118 Type 2 diabetes mellitus with unspecified complications: Secondary | ICD-10-CM

## 2021-06-30 LAB — POCT GLYCOSYLATED HEMOGLOBIN (HGB A1C): Hemoglobin A1C: 5.7 % — AB (ref 4.0–5.6)

## 2021-06-30 MED ORDER — ACCU-CHEK GUIDE W/DEVICE KIT: PACK | 0 refills | Status: AC

## 2021-06-30 MED ORDER — ACCU-CHEK AVIVA PLUS W/DEVICE KIT: PACK | 0 refills | Status: DC

## 2021-06-30 MED ORDER — AMLODIPINE BESYLATE 5 MG PO TABS: 5.0000 mg | ORAL_TABLET | Freq: Every day | ORAL | 1 refills | Status: DC

## 2021-06-30 MED ORDER — ACCU-CHEK SOFTCLIX LANCETS MISC: 12 refills | Status: AC

## 2021-06-30 MED ORDER — ACCU-CHEK GUIDE VI STRP: ORAL_STRIP | 12 refills | Status: AC

## 2021-06-30 NOTE — Assessment & Plan Note (Signed)
We discussed CV benefits of statins. Recommendations about type os statin and dose will be given according to lipid panel result.

## 2021-06-30 NOTE — Patient Instructions (Addendum)
A few things to remember from today's visit:  Controlled type 2 diabetes mellitus with complication, without long-term current use of insulin ([place]) - Plan: POC HgB A1c  Essential hypertension - Plan: amLODipine (NORVASC) 5 MG tablet  If you need refills please call your pharmacy. Do not use My Chart to request refills or for acute issues that need immediate attention.   Today blood pressure medication changed to Amlodipine 5 mg at bedtime. Please let me know about blood pressure readings in 3-4 weeks. Goal is under 140/90.  Please be sure medication list is accurate. If a new problem present, please set up appointment sooner than planned today.  DASH Eating Plan DASH stands for Dietary Approaches to Stop Hypertension. The DASH eating plan is a healthy eating plan that has been shown to: Reduce high blood pressure (hypertension). Reduce your risk for type 2 diabetes, heart disease, and stroke. Help with weight loss. What are tips for following this plan? Reading food labels Check food labels for the amount of salt (sodium) per serving. Choose foods with less than 5 percent of the Daily Value of sodium. Generally, foods with less than 300 milligrams (mg) of sodium per serving fit into this eating plan. To find whole grains, look for the word "whole" as the first word in the ingredient list. Shopping Buy products labeled as "low-sodium" or "no salt added." Buy fresh foods. Avoid canned foods and pre-made or frozen meals. Cooking Avoid adding salt when cooking. Use salt-free seasonings or herbs instead of table salt or sea salt. Check with your health care provider or pharmacist before using salt substitutes. Do not fry foods. Cook foods using healthy methods such as baking, boiling, grilling, roasting, and broiling instead. Cook with heart-healthy oils, such as olive, canola, avocado, soybean, or sunflower oil. Meal planning  Eat a balanced diet that includes: 4 or more servings of  fruits and 4 or more servings of vegetables each day. Try to fill one-half of your plate with fruits and vegetables. 6-8 servings of whole grains each day. Less than 6 oz (170 g) of lean meat, poultry, or fish each day. A 3-oz (85-g) serving of meat is about the same size as a deck of cards. One egg equals 1 oz (28 g). 2-3 servings of low-fat dairy each day. One serving is 1 cup (237 mL). 1 serving of nuts, seeds, or beans 5 times each week. 2-3 servings of heart-healthy fats. Healthy fats called omega-3 fatty acids are found in foods such as walnuts, flaxseeds, fortified milks, and eggs. These fats are also found in cold-water fish, such as sardines, salmon, and mackerel. Limit how much you eat of: Canned or prepackaged foods. Food that is high in trans fat, such as some fried foods. Food that is high in saturated fat, such as fatty meat. Desserts and other sweets, sugary drinks, and other foods with added sugar. Full-fat dairy products. Do not salt foods before eating. Do not eat more than 4 egg yolks a week. Try to eat at least 2 vegetarian meals a week. Eat more home-cooked food and less restaurant, buffet, and fast food. Lifestyle When eating at a restaurant, ask that your food be prepared with less salt or no salt, if possible. If you drink alcohol: Limit how much you use to: 0-1 drink a day for women who are not pregnant. 0-2 drinks a day for men. Be aware of how much alcohol is in your drink. In the [place] one drink equals one 12 oz  bottle of beer (355 mL), one 5 oz glass of wine (148 mL), or one 1 oz glass of hard liquor (44 mL). General information Avoid eating more than 2,300 mg of salt a day. If you have hypertension, you may need to reduce your sodium intake to 1,500 mg a day. Work with your health care provider to maintain a healthy body weight or to lose weight. Ask what an ideal weight is for you. Get at least 30 minutes of exercise that causes your heart to beat faster  (aerobic exercise) most days of the week. Activities may include walking, swimming, or biking. Work with your health care provider or dietitian to adjust your eating plan to your individual calorie needs. What foods should I eat? Fruits All fresh, dried, or frozen fruit. Canned fruit in natural juice (without added sugar). Vegetables Fresh or frozen vegetables (raw, steamed, roasted, or grilled). Low-sodium or reduced-sodium tomato and vegetable juice. Low-sodium or reduced-sodium tomato sauce and tomato paste. Low-sodium or reduced-sodium canned vegetables. Grains Whole-grain or whole-wheat bread. Whole-grain or whole-wheat pasta. Brown rice. [place]. Bulgur. Whole-grain and low-sodium cereals. Pita bread. Low-fat, low-sodium crackers. Whole-wheat flour tortillas. Meats and other proteins Skinless chicken or [place]. Ground chicken or [place]. Pork with fat trimmed off. Fish and seafood. Egg whites. Dried beans, peas, or lentils. Unsalted nuts, nut butters, and seeds. Unsalted canned beans. Lean cuts of beef with fat trimmed off. Low-sodium, lean precooked or cured meat, such as sausages or meat loaves. Dairy Low-fat (1%) or fat-free (skim) milk. Reduced-fat, low-fat, or fat-free cheeses. Nonfat, low-sodium ricotta or cottage cheese. Low-fat or nonfat yogurt. Low-fat, low-sodium cheese. Fats and oils Soft margarine without trans fats. Vegetable oil. Reduced-fat, low-fat, or light mayonnaise and salad dressings (reduced-sodium). Canola, safflower, olive, avocado, soybean, and sunflower oils. Avocado. Seasonings and condiments Herbs. Spices. Seasoning mixes without salt. Other foods Unsalted popcorn and pretzels. Fat-free sweets. The items listed above may not be a complete list of foods and beverages you can eat. Contact a dietitian for more information. What foods should I avoid? Fruits Canned fruit in a light or heavy syrup. Fried fruit. Fruit in cream or butter  sauce. Vegetables Creamed or fried vegetables. Vegetables in a cheese sauce. Regular canned vegetables (not low-sodium or reduced-sodium). Regular canned tomato sauce and paste (not low-sodium or reduced-sodium). Regular tomato and vegetable juice (not low-sodium or reduced-sodium). Angie Fava. Olives. Grains Baked goods made with fat, such as croissants, muffins, or some breads. Dry pasta or rice meal packs. Meats and other proteins Fatty cuts of meat. Ribs. Fried meat. Berniece Salines. Bologna, salami, and other precooked or cured meats, such as sausages or meat loaves. Fat from the back of a pig (fatback). Bratwurst. Salted nuts and seeds. Canned beans with added salt. Canned or smoked fish. Whole eggs or egg yolks. Chicken or [place] with skin. Dairy Whole or 2% milk, cream, and half-and-half. Whole or full-fat cream cheese. Whole-fat or sweetened yogurt. Full-fat cheese. Nondairy creamers. Whipped toppings. Processed cheese and cheese spreads. Fats and oils Butter. Stick margarine. Lard. Shortening. Ghee. Bacon fat. Tropical oils, such as coconut, palm kernel, or palm oil. Seasonings and condiments Onion salt, garlic salt, seasoned salt, table salt, and sea salt. Worcestershire sauce. Tartar sauce. Barbecue sauce. Teriyaki sauce. Soy sauce, including reduced-sodium. Steak sauce. Canned and packaged gravies. Fish sauce. Oyster sauce. Cocktail sauce. Store-bought horseradish. Ketchup. Mustard. Meat flavorings and tenderizers. Bouillon cubes. Hot sauces. Pre-made or packaged marinades. Pre-made or packaged taco seasonings. Relishes. Regular salad dressings. Other foods  Salted popcorn and pretzels. The items listed above may not be a complete list of foods and beverages you should avoid. Contact a dietitian for more information. Where to find more information National Heart, Lung, and Blood Institute: https://wilson-eaton.com/ American Heart Association: www.heart.org Academy of Nutrition and Dietetics:  www.eatright.[place]: www.kidney.org Summary The DASH eating plan is a healthy eating plan that has been shown to reduce high blood pressure (hypertension). It may also reduce your risk for type 2 diabetes, heart disease, and stroke. When on the DASH eating plan, aim to eat more fresh fruits and vegetables, whole grains, lean proteins, low-fat dairy, and heart-healthy fats. With the DASH eating plan, you should limit salt (sodium) intake to 2,300 mg a day. If you have hypertension, you may need to reduce your sodium intake to 1,500 mg a day. Work with your health care provider or dietitian to adjust your eating plan to your individual calorie needs. This information is not intended to replace advice given to you by your health care provider. Make sure you discuss any questions you have with your health care provider. Document Revised: 06/19/2019 Document Reviewed: 06/19/2019 Elsevier Patient Education  2022 [employer].

## 2021-06-30 NOTE — Telephone Encounter (Signed)
[employer] tech is calling the [employer] plus has been discontinued and they need new rx for accu chek guide or accu  chek guide me  meter also with soft click lancets and testing stripes send to  [place] ([place]), [place] - [address] Phone:  [phone]  Fax:  [phone]

## 2021-06-30 NOTE — Assessment & Plan Note (Signed)
BP adequately controlled. She would like to change to a different medication. We discussed benefits and side effects of ACEI's. She agrees with trying Amlodipine 5 mg at bedtime. Monitor BP daily and let me know about BP readings in 3-4 weeks. Low salt/DASH diet also recommended.

## 2021-06-30 NOTE — Assessment & Plan Note (Signed)
HgA1C at goal. We discussed Dx criteria.  Continue non pharmacologic treatment. Regular exercise and healthy diet with avoidance of added sugar food intake to continue. Annual eye exam, periodic dental and foot care recommended. F/U in 5-6 months

## 2021-06-30 NOTE — Progress Notes (Signed)
HPI: Ms.Niyla Porco is a 60 y.o. female, who is here today for chronic disease management.  Last seen on 12/28/20.  Since her last visit she has seen Dr. Delice Lesch, neurologist. She was also evaluated here in the office for acute visit, dental abscess.  Diabetes Mellitus II: Dx'ed in 11/2018 with HgA1C 6.5 x 2. - Checking BG at home: Not checking. - Medications: Nonpharmacologic treatment. - Diet: Decreased sugar intake. - Exercise: 2 miles 4 times per week. - eye exam: 2 months ago, q 6 months. - foot exam: 12/2020.  - Negative for symptoms of hypoglycemia, polyuria, polydipsia, foot ulcers/trauma Peripheral neuropathy, chemo related, she follows with neurologist and on Gabapentin.  Lab Results  Component Value Date   HGBA1C 6.0 12/20/2020   Lab Results  Component Value Date   MICROALBUR 4.1 (H) 12/20/2020   Hypertension:  Medications: Lisinopril-HCTZ 20-12.5 mg daily.She would like to stop Lisinopril, states that she was told by her son, she should stop it. She also heard Lisinopril "is not good for black people." BP readings at home:She checks BP occasionally and lower than today's readings. Side effects:None  Negative for unusual or severe headache, visual changes, exertional chest pain, dyspnea,  focal weakness, or edema.  Lab Results  Component Value Date   CREATININE 0.97 04/13/2021   BUN 13 04/13/2021   NA 140 04/13/2021   K 3.7 04/13/2021   CL 101 04/13/2021   CO2 31 04/13/2021   HLD: She is not on pharmacologic treatment. She has not make major changes since her last visit.  Lab Results  Component Value Date   CHOL 262 (H) 12/20/2020   HDL 76.20 12/20/2020   LDLCALC 153 (H) 12/20/2020   LDLDIRECT 123.0 10/26/2014   TRIG 165.0 (H) 12/20/2020   CHOLHDL 3 12/20/2020   Review of Systems  Constitutional:  Negative for activity change, appetite change and fever.  HENT:  Negative for mouth sores, nosebleeds and sore throat.   Respiratory:  Negative for  cough and wheezing.   Gastrointestinal:  Negative for abdominal pain, nausea and vomiting.       Negative for changes in bowel habits.  Genitourinary:  Negative for decreased urine volume, difficulty urinating, dysuria and hematuria.  Musculoskeletal:  Negative for gait problem and myalgias.  Skin:  Negative for rash and wound.  Neurological:  Negative for syncope, facial asymmetry and weakness.  Rest of ROS see pertinent positives and negatives in HPI.  Current Outpatient Medications on File Prior to Visit  Medication Sig Dispense Refill   albuterol (VENTOLIN HFA) 108 (90 Base) MCG/ACT inhaler Inhale 2 puffs into the lungs every 6 (six) hours as needed for wheezing or shortness of breath. 8 g 0   aspirin 81 MG tablet Take 81 mg by mouth daily.     b complex vitamins capsule Take 1 capsule by mouth daily.     BIOTIN 5000 PO Take 1 capsule by mouth. Takes sometimes     Calcium Carbonate-Vit D-Min (CALTRATE 600+D PLUS MINERALS PO) Take 1 tablet by mouth daily.     gabapentin (NEURONTIN) 600 MG tablet Take 1 tablet (600 mg total) by mouth 3 (three) times daily. 270 tablet 3   HEMP OIL-VANILLYL BUTYL ETHER EX Apply topically as needed.     hydroxychloroquine (PLAQUENIL) 200 MG tablet Take 1 tablet (200 mg total) by mouth daily. 90 tablet 0   loratadine (CLARITIN) 10 MG tablet Take 10 mg by mouth as needed.     MAGNESIUM PO Take by  mouth daily.     omeprazole (PRILOSEC) 40 MG capsule TAKE 1 CAPSULE BY MOUTH ONCE DAILY BEFORE BREAKFAST 90 capsule 2   Propylene Glycol (SYSTANE COMPLETE OP) Apply to eye.     SUMAtriptan (IMITREX) 25 MG tablet Take 1 tablet (25 mg total) by mouth every 2 (two) hours as needed for migraine. May repeat in 2 hours if needed. Do not take more then 2 tablets in 24 hours. 10 tablet 11   TURMERIC PO Take by mouth daily.     UNABLE TO FIND Apply topically as needed. THC oil     No current facility-administered medications on file prior to visit.    Past Medical History:   Diagnosis Date   Anxiety    Breast cancer ([place]) 2015   Right Breast Cancer   Cancer of right breast ([place]) 09/03/13   Invasive Ductal Carcinoma/Ductal Carcinoma Insitu  biopsies   Chronic low back pain 01/24/2016   -and R hip pain -seen by rheumatologist and ortho in the past -MRI with Dr. Lorin Mercy in 2015, R hip plain films in 2015 as well acupuncture, injections, tramadol not helpful trigger therapy [facility] and PT helpful    Degenerative joint disease    back neck and shoulder   Fibromyalgia    Dr. Estanislado Pandy   GERD (gastroesophageal reflux disease)    hx esophagitis   Hot flashes    Hypertension    Leukopenia 06/16/2013   Lupus [facility])    rheumatologist - Dr. Estanislado Pandy   Migraines    Neuromuscular disorder [facility])    neuropathy from chemo in feet/hands   Personal history of chemotherapy    2015   Personal history of radiation therapy 2015   Right Breast Cancer   S/P radiation therapy 04/27/2014-06/07/2014   1) Right breast, 50 Gy in 25 fractions/ 2) Right breast boost, 10 Gy in 5 fractions   Allergies  Allergen Reactions   Adhesive [Tape] Itching    Burned and scarred skinned   Other     Cholroprep-causes rash   Nickel Rash    Social History   Socioeconomic History   Marital status: Married    Spouse name: Not on file   Number of children: 1   Years of education: Not on file   Highest education level: Not on file  Occupational History    Comment: homemaker  Tobacco Use   Smoking status: Former    Packs/day: 1.00    Years: 15.00    Pack years: 15.00    Types: Cigars, Cigarettes    Quit date: 02/23/2004    Years since quitting: 17.3   Smokeless tobacco: Never   Tobacco comments:    remote smoking history  Vaping Use   Vaping Use: Never used  Substance and Sexual Activity   Alcohol use: Yes    Alcohol/week: 0.0 standard drinks    Comment: rare   Drug use: Yes    Types: Marijuana    Comment: Marijuana as needed    Sexual activity: Yes  Other Topics  Concern   Not on file  Social History Narrative   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      Home Situation: lives with husband and 26 yo son      Spiritual Beliefs: Christian      Lifestyle: elliptical 3-4 times per week; working on diet as well over last year in 2014            Social Determinants of Health  Financial Resource Strain: Low Risk    Difficulty of Paying Living Expenses: Not hard at all  Food Insecurity: No Food Insecurity   Worried About Charity fundraiser in the Last Year: Never true   Ran Out of Food in the Last Year: Never true  Transportation Needs: No Transportation Needs   Lack of Transportation (Medical): No   Lack of Transportation (Non-Medical): No  Physical Activity: Sufficiently Active   Days of Exercise per Week: 5 days   Minutes of Exercise per Session: 30 min  Stress: Stress Concern Present   Feeling of Stress : To some extent  Social Connections: Engineer, building services of Communication with Friends and Family: More than three times a week   Frequency of Social Gatherings with Friends and Family: More than three times a week   Attends Religious Services: More than 4 times per year   Active Member of [employer] or Organizations: Yes   Attends Archivist Meetings: 1 to 4 times per year   Marital Status: Married   Vitals:   06/30/21 1410  BP: 132/84  Pulse: 78  Resp: 16  Temp: 98.9 F (37.2 C)  SpO2: 99%   Body mass index is 30.31 kg/m.  Physical Exam Vitals and nursing note reviewed.  Constitutional:      General: She is not in acute distress.    Appearance: She is well-developed.  HENT:     Head: Normocephalic and atraumatic.     Mouth/Throat:     Mouth: Mucous membranes are moist.     Pharynx: Oropharynx is clear.  Eyes:     Conjunctiva/sclera: Conjunctivae normal.  Cardiovascular:     Rate and Rhythm: Normal rate and regular rhythm.     Pulses:          Dorsalis pedis pulses are 2+ on the right side and 2+ on the  left side.     Heart sounds: No murmur heard. Pulmonary:     Effort: Pulmonary effort is normal. No respiratory distress.     Breath sounds: Normal breath sounds.  Abdominal:     Palpations: Abdomen is soft. There is no hepatomegaly or mass.     Tenderness: There is no abdominal tenderness.  Lymphadenopathy:     Cervical: No cervical adenopathy.  Skin:    General: Skin is warm.     Findings: No erythema or rash.  Neurological:     General: No focal deficit present.     Mental Status: She is alert and oriented to person, place, and time.     Cranial Nerves: No cranial nerve deficit.     Gait: Gait normal.  Psychiatric:     Comments: Well groomed, good eye contact.   ASSESSMENT AND PLAN:  Ms.Myrl was seen today for follow-up.  Diagnoses and all orders for this visit: Orders Placed This Encounter  Procedures   Flu Vaccine QUAD 23mo+IM (Fluarix, Fluzone & Alfiuria Quad PF)   Microalbumin/Creatinine Ratio, Urine   Lipid Panel   POC HgB A1c   Lab Results  Component Value Date   HGBA1C 5.7 (A) 06/30/2021   Lab Results  Component Value Date   MICROALBUR 0.3 06/30/2021        Lab Results  Component Value Date   CHOL 262 (H) 12/20/2020   HDL 76.20 12/20/2020   LDLCALC 153 (H) 12/20/2020   LDLDIRECT 123.0 10/26/2014   TRIG 165.0 (H) 12/20/2020   CHOLHDL 3 12/20/2020   Controlled type 2 diabetes mellitus  with complication, without long-term current use of insulin (HCC) HgA1C at goal. We discussed Dx criteria.  Continue non pharmacologic treatment. Regular exercise and healthy diet with avoidance of added sugar food intake to continue. Annual eye exam, periodic dental and foot care recommended. F/U in 5-6 months   Hyperlipidemia We discussed CV benefits of statins. Recommendations about type os statin and dose will be given according to lipid panel result.  Hypertension BP adequately controlled. She would like to change to a different medication. We discussed  benefits and side effects of ACEI's. She agrees with trying Amlodipine 5 mg at bedtime. Monitor BP daily and let me know about BP readings in 3-4 weeks. Low salt/DASH diet also recommended.  Need for influenza vaccination -     Flu Vaccine QUAD 36mo+IM (Fluarix, Fluzone & Alfiuria Quad PF)  Return in about 6 months (around 12/29/2021) for cpe.  Patric Vanpelt G. [place], MD  [facility] [place] office.

## 2021-06-30 NOTE — Telephone Encounter (Signed)
Rxs sent

## 2021-07-01 LAB — MICROALBUMIN / CREATININE URINE RATIO
Creatinine, Urine: 75 mg/dL (ref 20–275)
Microalb Creat Ratio: 4 mcg/mg creat (ref <30)
Microalb, Ur: 0.3 mg/dL

## 2021-07-02 ENCOUNTER — Encounter: Payer: Self-pay | Admitting: Family Medicine

## 2021-07-28 ENCOUNTER — Ambulatory Visit: Payer: BC Managed Care – PPO | Admitting: Neurology

## 2021-08-14 ENCOUNTER — Ambulatory Visit: Payer: BC Managed Care – PPO | Admitting: Physician Assistant

## 2021-08-14 NOTE — Progress Notes (Incomplete)
NEUROLOGY CONSULTATION NOTE  Tina [name] MRN: [id number] DOB: 1960/08/10  Referring provider:Lindsay Etheleen Nicks Primary care provider: Dr. Betty Martinique  Reason for consult:  headaches  Thank you for your kind referral of Breasia Karges for consultation of the above symptoms. Although her history is well known to you, please allow me to reiterate it for the purpose of our medical record. She is alone in the office today. Records and images were personally reviewed where available.This is a very pleasant 61 year old right-handed woman with a history of breast cancer s/p radiation and chemotherapy with neuropathy due to chemotherapy, hypertension, previously seen in our office from 2016 to 2019 for headaches. She presents today after a bout of significant headaches last April in the setting of COVID. Headaches have improved, she has occasional stabbing headaches and prn sumatriptan. She also takes the gabapentin 673m TID for neuropathy. Refills sent. Follow-up in 6-8 months, call for any changes.  Headaches were felt due to be cervicogenic in nature, improved with physical therapy and gabapentin. She started having left-sided facial pain in June 2019 reporting stabbing pain on the left facial area and ear/neck. Brain MRI/trigeminal protocol in 02/2018 was normal. Gabapentin was increased to 6030mTID. She was lost to follow-up for 3 years and reports that the symptoms overall quieted down and she was taking the Gabapentin 60042mID more for the neuropathy, noticing that when she misses a dose, she feels symptoms in her feet. When she takes it regularly, feet and hands feel good with no symptoms.  She denies any further neck pain. She has had a couple of episodes of left-sided facial pain since her last visit lasting for a week. She feels her teeth may also have contributed, she had teeth pulled recently. She called our office last April 2022 to report facial pain and headache for 2 weeks, taking  3-4 Tylenol daily. It would start as a muscle spasm then pain and headache starts. She went to the ER where head CT did not show any acute changes. She was given a migraine cocktail with significant relief in symptoms. She reports that she then found out she had COVID and that [place] with no recurrence in the past 3 months. She has prn sumatriptan and has not needed it since April. She has a nagging headache every now and then. She reports an episode of brief loss of consciousness 2 weeks ago, she was exhausted, doing a lot of running around and a little stressed out. She was sitting on the porch then stood up and everything got bright. Her husband tried to catch her, she was out very briefly and when she woke up, BP was extremely low. She exercises regularly. She denies any dizziness, diplopia, dysarthria/dysphagia. Sleep is good.    HISTORY OF PRESENT ILLNESS 02/08/21: This is a very pleasant 61 68ar old right-handed woman with a history of breast cancer s/p radiation and chemotherapy with neuropathy due to chemotherapy, hypertension, previously seen in our office from 2016 to 2019 for headaches. In 2016 she presented with a different kind of headache with pain on the right hemisphere radiating down the right side of her face and neck. There was occasional tingling in her scalp. There would be nausea/vomiting. Brain MRI in 2016 was normal. Cervical MRI showed multilevel degenerative changes with central disc herniation, no cord edema. Headaches were felt due to be cervicogenic in nature, improved with physical therapy and gabapentin. She started having  left-sided facial pain in June 2019 reporting stabbing pain on the left facial area and ear/neck. Brain MRI/trigeminal protocol in 02/2018 was normal. Gabapentin was increased to $RemoveBefo'600mg'AGqbSeGqBNi$  TID. She was lost to follow-up for 3 years and reports that the symptoms overall quieted down and she was taking the Gabapentin $RemoveBefore'600mg'zihFbpzgjiGLn$   TID more for the neuropathy, noticing that when she misses a dose, she feels symptoms in her feet. When she takes it regularly, feet and hands feel good with no symptoms. She denies any further neck pain. She has had a couple of episodes of left-sided facial pain since her last visit lasting for a week. She feels her teeth may also have contributed, she had teeth pulled recently. She called our office last April 2022 to report facial pain and headache for 2 weeks, taking 3-4 Tylenol daily. It would start as a muscle spasm then pain and headache starts. She went to the ER where head CT did not show any acute changes. She was given a migraine cocktail with significant relief in symptoms. She reports that she then found out she had COVID and that when she got better from [employer], the headaches resolved with no recurrence in the past 3 months. She has prn sumatriptan and has not needed it since April. She has a nagging headache every now and then. She reports an episode of brief loss of consciousness 2 weeks ago, she was exhausted, doing a lot of running around and a little stressed out. She was sitting on the porch then stood up and everything got bright. Her husband tried to catch her, she was out very briefly and when she woke up, BP was extremely low. She exercises regularly. She denies any dizziness, diplopia, dysarthria/dysphagia. Sleep is good.    PAST MEDICAL HISTORY: Past Medical History:  Diagnosis Date   Anxiety    Breast cancer ([place]) 2015   Right Breast Cancer   Cancer of right breast ([place]) 09/03/13   Invasive Ductal Carcinoma/Ductal Carcinoma Insitu  biopsies   Chronic low back pain 01/24/2016   -and R hip pain -seen by rheumatologist and ortho in the past -MRI with Dr. Lorin Mercy in 2015, R hip plain films in 2015 as well acupuncture, injections, tramadol not helpful trigger therapy [facility] and PT helpful    Degenerative joint disease    back neck and shoulder   Fibromyalgia    Dr.  Estanislado Pandy   GERD (gastroesophageal reflux disease)    hx esophagitis   Hot flashes    Hypertension    Leukopenia 06/16/2013   Lupus [facility])    rheumatologist - Dr. Estanislado Pandy   Migraines    Neuromuscular disorder [facility])    neuropathy from chemo in feet/hands   Personal history of chemotherapy    2015   Personal history of radiation therapy 2015   Right Breast Cancer   S/P radiation therapy 04/27/2014-06/07/2014   1) Right breast, 50 Gy in 25 fractions/ 2) Right breast boost, 10 Gy in 5 fractions    PAST SURGICAL HISTORY: Past Surgical History:  Procedure Laterality Date   BREAST BIOPSY Left 2015   benign core   BREAST BIOPSY Right 2015   malignant core    BREAST LUMPECTOMY Right 2015   BREAST SURGERY  03/01/2014   hx right breast cancer- 3 lymph nodes removed and lumpectomy   CARPAL TUNNEL RELEASE  1998   rt   CESAREAN SECTION     x 3   PORTACATH PLACEMENT  08/2013   still  currently active    ROBOTIC ASSISTED BILATERAL SALPINGO OOPHERECTOMY Bilateral 07/09/2014   Procedure: ROBOTIC ASSISTED BILATERAL SALPINGO OOPHORECTOM; :LYSIS OF ADHESIONS;  Surgeon: Marvene Staff, MD;  Location: [facility];  Service: Gynecology;  Laterality: Bilateral;   TRIGGER FINGER RELEASE  2004   rt thumb   trigger finger release left thumb  06/28/14   at orthopedic surgical center   [place]: Current Outpatient Medications on File Prior to Visit  Medication Sig Dispense Refill   Accu-Chek Softclix Lancets lancets Use to check blood sugars 1-2 times daily. 100 each 12   albuterol (VENTOLIN HFA) 108 (90 Base) MCG/ACT inhaler Inhale 2 puffs into the lungs every 6 (six) hours as needed for wheezing or shortness of breath. 8 g 0   amLODipine (NORVASC) 5 MG tablet Take 1 tablet (5 mg total) by mouth daily. 90 tablet 1   aspirin 81 MG tablet Take 81 mg by mouth daily.     b complex vitamins capsule Take 1 capsule by mouth daily.     BIOTIN 5000 PO  Take 1 capsule by mouth. Takes sometimes     Blood Glucose Monitoring Suppl (ACCU-CHEK GUIDE) w/Device KIT Use to check blood sugars 1-2 times daily. 1 kit 0   Calcium Carbonate-Vit D-Min (CALTRATE 600+D PLUS MINERALS PO) Take 1 tablet by mouth daily.     gabapentin (NEURONTIN) 600 MG tablet Take 1 tablet (600 mg total) by mouth 3 (three) times daily. 270 tablet 3   glucose blood (ACCU-CHEK GUIDE) test strip Use to check blood sugars 1-2 times daily. 100 each 12   HEMP OIL-VANILLYL BUTYL ETHER EX Apply topically as needed.     hydroxychloroquine (PLAQUENIL) 200 MG tablet Take 1 tablet (200 mg total) by mouth daily. 90 tablet 0   loratadine (CLARITIN) 10 MG tablet Take 10 mg by mouth as needed.     MAGNESIUM PO Take by mouth daily.     omeprazole (PRILOSEC) 40 MG capsule TAKE 1 CAPSULE BY MOUTH ONCE DAILY BEFORE BREAKFAST 90 capsule 2   Propylene Glycol (SYSTANE COMPLETE OP) Apply to eye.     SUMAtriptan (IMITREX) 25 MG tablet Take 1 tablet (25 mg total) by mouth every 2 (two) hours as needed for migraine. May repeat in 2 hours if needed. Do not take more then 2 tablets in 24 hours. 10 tablet 11   TURMERIC PO Take by mouth daily.     UNABLE TO FIND Apply topically as needed. THC oil     No current facility-administered medications on file prior to visit.    ALLERGIES: Allergies  Allergen Reactions   Adhesive [Tape] Itching    Burned and scarred skinned   Other     Cholroprep-causes rash   Nickel Rash    FAMILY HISTORY: Family History  Problem Relation Age of Onset   Hypertension Mother    Dementia Mother        small vessel disease   Cancer Mother 34       cervical  cancer   Cancer Father 24       prostate ca   Diabetes Father    Arthritis Father    Cancer Half-Sister    Breast cancer Neg Hx     SOCIAL HISTORY: Social History   Socioeconomic History   Marital status: Married    Spouse name: Not on file   Number of children: 1   Years of education:  Not on file    Highest education level: Not on file  Occupational History    Comment: homemaker  Tobacco Use   Smoking status: Former    Packs/day: 1.00    Years: 15.00    Pack years: 15.00    Types: Cigars, Cigarettes    Quit date: 02/23/2004    Years since quitting: 17.4   Smokeless tobacco: Never   Tobacco comments:    remote smoking history  Vaping Use   Vaping Use: Never used  Substance and Sexual Activity   Alcohol use: Yes    Alcohol/week: 0.0 standard drinks    Comment: rare   Drug use: Yes    Types: Marijuana    Comment: Marijuana as needed    Sexual activity: Yes  Other Topics Concern   Not on file  Social History Narrative   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      Home Situation: lives with husband and 75 yo son      Spiritual Beliefs: Christian      Lifestyle: elliptical 3-4 times per week; working on diet as well over last year in 2014            Social Determinants of Health   Financial Resource Strain: Low Risk    Difficulty of Paying Living Expenses: Not hard at all  Food Insecurity: No Food Insecurity   Worried About Charity fundraiser in the Last Year: Never true   Arboriculturist in the Last Year: Never true  Transportation Needs: No Transportation Needs   Lack of Transportation (Medical): No   Lack of Transportation (Non-Medical): No  Physical Activity: Sufficiently Active   Days of Exercise per Week: 5 days   Minutes of Exercise per Session: 30 min  Stress: Stress Concern Present   Feeling of Stress : To some extent  Social Connections: Engineer, building services of Communication with Friends and Family: More than three times a week   Frequency of Social Gatherings with Friends and Family: More than three times a week   Attends Religious Services: More than 4 times per year   Active Member of [employer] or Organizations: Yes   Attends Archivist Meetings: 1 to 4 times per year   Marital Status: Married  Human resources officer Violence: Not At Risk    Fear of Current or Ex-Partner: No   Emotionally Abused: No   Physically Abused: No   Sexually Abused: No     PHYSICAL EXAM: There were no vitals filed for this visit.  General: No acute distress Head:  Normocephalic/atraumatic Skin/Extremities: No rash, no edema Neurological Exam: Mental status: alert and oriented to person, place, and time, no dysarthria or aphasia, Fund of knowledge is appropriate.  Recent and remote memory are intact.  Attention and concentration are normal.    Cranial nerves: CN I: not tested CN II: pupils equal, round and reactive to light, visual fields intact CN III, IV, VI:  full range of motion, no nystagmus, no ptosis CN V: facial sensation intact CN VII: upper and lower face symmetric CN VIII: hearing intact to conversation CN IX, X: gag intact, uvula midline CN XI: sternocleidomastoid and trapezius muscles intact CN XII: tongue midline Bulk & Tone: normal, no fasciculations. Motor: 5/5 throughout with no pronator drift. Sensation: intact to light touch, cold, pin on both UE, intact cold, pin on both LE, decreased vibration sense to left ankle. Romberg test negative Deep Tendon Reflexes: +2 throughout except for  absent ankle jerks bilaterally Cerebellar: no incoordination on finger to nose testing Gait: narrow-based and steady, able to tandem walk adequately Tremor: none   IMPRESSION: This is a very pleasant 61 year old right-handed woman with a history of breast cancer s/p radiation and chemotherapy with neuropathy due to chemotherapy, hypertension, previously seen in our office from 2016 to 2019 for headaches. She presents today after a bout of significant headaches last April in the setting of COVID. Headaches have improved, she has occasional stabbing headaches and prn sumatriptan. She also takes the gabapentin 635m TID for neuropathy. Refills sent. Follow-up in 6-8 months, call for any changes.    Thank you for allowing me to participate in the  care of this patient. Please do not hesitate to call for any questions or concerns.   KEllouise Newer M.D.  CC: Dr. JMartinique

## 2021-08-18 ENCOUNTER — Encounter: Payer: Self-pay | Admitting: Physician Assistant

## 2021-08-18 ENCOUNTER — Ambulatory Visit (INDEPENDENT_AMBULATORY_CARE_PROVIDER_SITE_OTHER): Payer: BC Managed Care – PPO | Admitting: Physician Assistant

## 2021-08-18 ENCOUNTER — Other Ambulatory Visit: Payer: Self-pay

## 2021-08-18 DIAGNOSIS — G620 Drug-induced polyneuropathy: Secondary | ICD-10-CM

## 2021-08-18 DIAGNOSIS — T451X5A Adverse effect of antineoplastic and immunosuppressive drugs, initial encounter: Secondary | ICD-10-CM

## 2021-08-18 DIAGNOSIS — G43009 Migraine without aura, not intractable, without status migrainosus: Secondary | ICD-10-CM

## 2021-08-18 MED ORDER — GABAPENTIN 600 MG PO TABS: 600.0000 mg | ORAL_TABLET | Freq: Three times a day (TID) | ORAL | 3 refills | Status: DC

## 2021-08-18 MED ORDER — SUMATRIPTAN SUCCINATE 25 MG PO TABS: 25.0000 mg | ORAL_TABLET | ORAL | 11 refills | Status: DC | PRN

## 2021-08-18 NOTE — Patient Instructions (Addendum)
Good to see you again!  Refills for Gabapentin 600mg  three times a day and as needed sumatriptan have been sent to your pharmacy  2. Consider Cognitive Behavioral Therapy or [facility]   2. Follow-up in 1 year  call for any changes

## 2021-08-18 NOTE — Progress Notes (Signed)
NEUROLOGYPROGRESS NOTE  Tina [name] MRN: [id number] DOB: 06-06-1961  Referring provider:Lindsay Etheleen Nicks Primary care provider: Dr. Betty Martinique  Reason for consult:  headaches  This is a very pleasant 61 year old right-handed woman with a history of breast cancer s/p radiation and chemotherapy with neuropathy due to chemotherapy, hypertension, initially seen in our office from 2016 to 2019 for headaches with worse symptoms during COVID infection in April 2022, which improved, requiring as needed sumatriptan with good results.  Since that time, headaches have improved, having only 2 headaches since her last visit, which were not severe, and did not require sumatriptan or even Tylenol.  Last headache was on 08-13-21, after the death of her best friend of 7 years.  She states that she cried a lot during the period of time.  She is feeling better now.  She also takes the gabapentin 643m TID for neuropathy  noticing that when she misses a dose, she feels symptoms in her feet. When she takes it regularly, feet and hands feel good with no symptoms. She denies any further neck pain after starting yoga and "grounding feet on the earth for 30 minutes ".  She has occasional tingling in the left greater than right upper extremities, but is minimal, and is intermittent, and once she takes gabapentin is resolved.  Left-sided facial pain has resolved with gabapentin.  She is exercising, walking her dog, and does not skip meals.  She also has decreased her sugar intake.  She denies any dizziness, diplopia, dysarthria, dysphagia.  Her sleep is good, denies nightmares, or sleepwalking.    HISTORY OF PRESENT ILLNESS 02/08/21: This is a very pleasant 61year old right-handed woman with a history of breast cancer s/p radiation and chemotherapy with neuropathy due to chemotherapy, hypertension, previously seen in our office from 2016 to 2019 for headaches. In 2016 she presented with a different kind of  headache with pain on the right hemisphere radiating down the right side of her face and neck. There was occasional tingling in her scalp. There would be nausea/vomiting. Brain MRI in 2016 was normal. Cervical MRI showed multilevel degenerative changes with central disc herniation, no cord edema. Headaches were felt due to be cervicogenic in nature, improved with physical therapy and gabapentin. She started having left-sided facial pain in June 2019 reporting stabbing pain on the left facial area and ear/neck. Brain MRI/trigeminal protocol in 02/2018 was normal. Gabapentin was increased to 6042mTID. She was lost to follow-up for 3 years and reports that the symptoms overall quieted down and she was taking the Gabapentin 60043mID more for the neuropathy, noticing that when she misses a dose, she feels symptoms in her feet. When she takes it regularly, feet and hands feel good with no symptoms. She denies any further neck pain. She has had a couple of episodes of left-sided facial pain since her last visit lasting for a week. She feels her teeth may also have contributed, she had teeth pulled recently. She called our office last April 2022 to report facial pain and headache for 2 weeks, taking 3-4 Tylenol daily. It would start as a muscle spasm then pain and headache starts. She went to the ER where head CT did not show any acute changes. She was given a migraine cocktail with significant relief in symptoms. She reports that she then found out she had COVID and that [place] with no recurrence in the past 3  months. She has prn sumatriptan and has not needed it since April. She has a nagging headache every now and then. She reports an episode of brief loss of consciousness 2 weeks ago, she was exhausted, doing a lot of running around and a little stressed out. She was sitting on the porch then stood up and everything got bright. Her husband tried to catch her, she was out  very briefly and when she woke up, BP was extremely low. She exercises regularly. She denies any dizziness, diplopia, dysarthria/dysphagia. Sleep is good.    PAST MEDICAL HISTORY: Past Medical History:  Diagnosis Date   Anxiety    Breast cancer ([place]) 2015   Right Breast Cancer   Cancer of right breast ([place]) 09/03/13   Invasive Ductal Carcinoma/Ductal Carcinoma [facility]  biopsies   Chronic low back pain 01/24/2016   -and R hip pain -seen by rheumatologist and ortho in the past -MRI with Dr. Lorin Mercy in 2015, R hip plain films in 2015 as well acupuncture, injections, tramadol not helpful trigger therapy [facility] and PT helpful    Degenerative joint disease    back neck and shoulder   Fibromyalgia    Dr. Estanislado Pandy   GERD (gastroesophageal reflux disease)    hx esophagitis   Hot flashes    Hypertension    Leukopenia 06/16/2013   Lupus [facility])    rheumatologist - Dr. Estanislado Pandy   Migraines    Neuromuscular disorder [facility])    neuropathy from chemo in feet/hands   Personal history of chemotherapy    2015   Personal history of radiation therapy 2015   Right Breast Cancer   S/P radiation therapy 04/27/2014-06/07/2014   1) Right breast, 50 Gy in 25 fractions/ 2) Right breast boost, 10 Gy in 5 fractions    PAST SURGICAL HISTORY: Past Surgical History:  Procedure Laterality Date   BREAST BIOPSY Left 2015   benign core   BREAST BIOPSY Right 2015   malignant core    BREAST LUMPECTOMY Right 2015   BREAST SURGERY  03/01/2014   hx right breast cancer- 3 lymph nodes removed and lumpectomy   CARPAL TUNNEL RELEASE  1998   rt   CESAREAN SECTION     x 3   PORTACATH PLACEMENT  08/2013   still currently active    ROBOTIC ASSISTED BILATERAL SALPINGO OOPHERECTOMY Bilateral 07/09/2014   Procedure: ROBOTIC ASSISTED BILATERAL SALPINGO OOPHORECTOM; :LYSIS OF ADHESIONS;  Surgeon: Marvene Staff, MD;  Location: [facility];  Service: Gynecology;  Laterality: Bilateral;   TRIGGER FINGER RELEASE   2004   rt thumb   trigger finger release left thumb  06/28/14   at orthopedic surgical center   [place]: Current Outpatient Medications on File Prior to Visit  Medication Sig Dispense Refill   Accu-Chek Softclix Lancets lancets Use to check blood sugars 1-2 times daily. 100 each 12   albuterol (VENTOLIN HFA) 108 (90 Base) MCG/ACT inhaler Inhale 2 puffs into the lungs every 6 (six) hours as needed for wheezing or shortness of breath. 8 g 0   amLODipine (NORVASC) 5 MG tablet Take 1 tablet (5 mg total) by mouth daily. 90 tablet 1   aspirin 81 MG tablet Take 81 mg by mouth daily.     b complex vitamins capsule Take 1 capsule by mouth daily.     BIOTIN 5000 PO Take 1 capsule by mouth. Takes sometimes  Blood Glucose Monitoring Suppl (ACCU-CHEK GUIDE) w/Device KIT Use to check blood sugars 1-2 times daily. 1 kit 0   Calcium Carbonate-Vit D-Min (CALTRATE 600+D PLUS MINERALS PO) Take 1 tablet by mouth daily.     gabapentin (NEURONTIN) 600 MG tablet Take 1 tablet (600 mg total) by mouth 3 (three) times daily. 270 tablet 3   glucose blood (ACCU-CHEK GUIDE) test strip Use to check blood sugars 1-2 times daily. 100 each 12   HEMP OIL-VANILLYL BUTYL ETHER EX Apply topically as needed.     hydroxychloroquine (PLAQUENIL) 200 MG tablet Take 1 tablet (200 mg total) by mouth daily. 90 tablet 0   loratadine (CLARITIN) 10 MG tablet Take 10 mg by mouth as needed.     MAGNESIUM PO Take by mouth daily.     omeprazole (PRILOSEC) 40 MG capsule TAKE 1 CAPSULE BY MOUTH ONCE DAILY BEFORE BREAKFAST 90 capsule 2   Propylene Glycol (SYSTANE COMPLETE OP) Apply to eye.     SUMAtriptan (IMITREX) 25 MG tablet Take 1 tablet (25 mg total) by mouth every 2 (two) hours as needed for migraine. May repeat in 2 hours if needed. Do not take more then 2 tablets in 24 hours. 10 tablet 11   TURMERIC PO Take by mouth daily.     UNABLE TO FIND Apply topically as needed. THC oil      No current facility-administered medications on file prior to visit.    ALLERGIES: Allergies  Allergen Reactions   Adhesive [Tape] Itching    Burned and scarred skinned   Other     Cholroprep-causes rash   Nickel Rash    FAMILY HISTORY: Family History  Problem Relation Age of Onset   Hypertension Mother    Dementia Mother        small vessel disease   Cancer Mother 1       cervical  cancer   Cancer Father 29       prostate ca   Diabetes Father    Arthritis Father    Cancer Half-Sister    Breast cancer Neg Hx     SOCIAL HISTORY: Social History   Socioeconomic History   Marital status: Married    Spouse name: Not on file   Number of children: 1   Years of education: Not on file   Highest education level: Not on file  Occupational History    Comment: homemaker  Tobacco Use   Smoking status: Former    Packs/day: 1.00    Years: 15.00    Pack years: 15.00    Types: Cigars, Cigarettes    Quit date: 02/23/2004    Years since quitting: 17.4   Smokeless tobacco: Never   Tobacco comments:    remote smoking history  Vaping Use   Vaping Use: Never used  Substance and Sexual Activity   Alcohol use: Yes    Alcohol/week: 0.0 standard drinks    Comment: rare   Drug use: Yes    Types: Marijuana    Comment: Marijuana as needed    Sexual activity: Yes  Other Topics Concern   Not on file  Social History Narrative   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      Home Situation: lives with husband and 42 yo son      Spiritual Beliefs: Christian      Lifestyle: elliptical 3-4 times per week; working on diet as well over last year in 2014  Social Determinants of Health   Financial Resource Strain: Low Risk    Difficulty of Paying Living Expenses: Not hard at all  Food Insecurity: No Food Insecurity   Worried About Charity fundraiser in the Last Year: Never true   [place] in the Last Year: Never true  Transportation Needs: No Transportation Needs    Lack of Transportation (Medical): No   Lack of Transportation (Non-Medical): No  Physical Activity: Sufficiently Active   Days of Exercise per Week: 5 days   Minutes of Exercise per Session: 30 min  Stress: Stress Concern Present   Feeling of Stress : To some extent  Social Connections: Engineer, building services of Communication with Friends and Family: More than three times a week   Frequency of Social Gatherings with Friends and Family: More than three times a week   Attends Religious Services: More than 4 times per year   Active Member of [employer] or Organizations: Yes   Attends Archivist Meetings: 1 to 4 times per year   Marital Status: Married  Human resources officer Violence: Not At Risk   Fear of Current or Ex-Partner: No   Emotionally Abused: No   Physically Abused: No   Sexually Abused: No     PHYSICAL EXAM: Vitals:   08/18/21 1055  BP: 138/82  Pulse: 98  Resp: 18  SpO2: 99%    General: No acute distress Head:  Normocephalic/atraumatic Skin/Extremities: No rash, no edema Neurological Exam: Mental status: alert and oriented to person, place, and time, no dysarthria or aphasia, Fund of knowledge is appropriate.  Recent and remote memory are intact.  Attention and concentration are normal.    Cranial nerves: CN I: not tested CN II: pupils equal, round and reactive to light, visual fields intact CN III, IV, VI:  full range of motion, no nystagmus, no ptosis CN V: facial sensation intact CN VII: upper and lower face symmetric CN VIII: hearing intact to conversation CN IX, X: gag intact, uvula midline CN XI: sternocleidomastoid and trapezius muscles intact CN XII: tongue midline Bulk & Tone: normal, no fasciculations. Motor: 5/5 throughout with no pronator drift. Sensation: intact to light touch, cold, pin on both UE, intact cold, pin on both LE, decreased vibration sense to left ankle. Romberg test negative Deep Tendon Reflexes: +2 throughout except for  absent ankle jerks bilaterally Cerebellar: no incoordination on finger to nose testing Gait: narrow-based and steady, able to tandem walk adequately Tremor: none   IMPRESSION: This is a very pleasant 61 year old right-handed woman with a history of breast cancer s/p radiation and chemotherapy with neuropathy due to chemotherapy, hypertension, previously seen in our office from 2016 to 2019 for headaches. She presents today in follow up. Headaches have improved, she has occasional stabbing headaches but has not been needing sumatriptan. She also takes the gabapentin 68m TID for neuropathy. Refills sent. Follow-up in 1 year  call for any changes.    Thank you for allowing me to participate in the care of this patient. Please do not hesitate to call for any questions or concerns.  SSharene Butters PA-C   CC: Dr. JMartinique

## 2021-08-31 NOTE — Progress Notes (Signed)
Office Visit Note  Patient: Tina [name]             Date of Birth: 12-20-1960           MRN: [id number]             PCP: Martinique, Betty G, MD Referring: Martinique, Betty G, MD Visit Date: 09/13/2021 Occupation: _0 @  Subjective:  Medication monitoring  History of Present Illness: Tina [name] is a 61 y.o. female with history of autoimmune disease, fibromyalgia, and osteoarthritis.  Patient is currently taking Plaquenil 200 mg 1 tablet by mouth daily.  She is tolerating Plaquenil without any side effects.  She denies any signs or symptoms of an autoimmune disease flare since her last office visit.  She has not had any recent rashes, photosensitivity, Raynaud's phenomenon, hair loss, oral or nasal ulcerations, sicca symptoms, shortness of breath, pleuritic chest pain, palpitations, or swollen lymph nodes.  She has started to increase her exercise regimen and experiences intermittent pain in both ankle joints and both knee joints.  She denies any joint swelling at this time. She denies any new medical conditions since her last office visit.    Activities of Daily Living:  Patient reports morning stiffness for few minutes  Patient Denies nocturnal pain.  Difficulty dressing/grooming: Denies Difficulty climbing stairs: Reports Difficulty getting out of chair: Denies Difficulty using hands for taps, buttons, cutlery, and/or writing: Denies  Review of Systems  Constitutional:  Negative for fatigue.  HENT:  Negative for mouth sores, mouth dryness and nose dryness.   Eyes:  Negative for pain, visual disturbance and dryness.  Respiratory:  Negative for cough, hemoptysis, shortness of breath and difficulty breathing.   Cardiovascular:  Negative for chest pain, palpitations, hypertension and swelling in legs/feet.  Gastrointestinal:  Negative for blood in stool, constipation and diarrhea.  Endocrine: Negative for increased urination.  Genitourinary:  Negative for painful urination.   Musculoskeletal:  Negative for joint pain, joint pain, joint swelling, myalgias, muscle weakness, morning stiffness, muscle tenderness and myalgias.  Skin:  Negative for color change, pallor, rash, hair loss, nodules/bumps, skin tightness, ulcers and sensitivity to sunlight.  Allergic/Immunologic: Negative for susceptible to infections.  Neurological:  Negative for dizziness, numbness, headaches and weakness.  Hematological:  Negative for swollen glands.  Psychiatric/Behavioral:  Negative for depressed mood and sleep disturbance. The patient is not nervous/anxious.    PMFS History:  Patient Active Problem List   Diagnosis Date Noted   Controlled type 2 diabetes mellitus with complication, without long-term current use of insulin ([place]) 05/07/2019   Hyperlipidemia 05/05/2019   Obesity (BMI 30-39.9) 05/05/2019   Fibromyalgia 02/28/2016   Chronic low back pain 01/24/2016   Lupus ([place]) 04/19/2015   GERD (gastroesophageal reflux disease) 04/19/2015   Cervicogenic headache 02/10/2015   Neck pain 02/10/2015   Hot flashes 10/26/2014   GAD (generalized anxiety disorder) 10/26/2014   Peripheral neuropathy due to chemotherapy ([place]) 03/12/2014   Breast cancer of lower-outer quadrant of right female breast ([place]) 09/15/2013   Left shoulder pain - seeing Dr. Lorin Mercy 08/20/2013   Hypertension     Past Medical History:  Diagnosis Date   Anxiety    Breast cancer ([place]) 2015   Right Breast Cancer   Cancer of right breast ([place]) 09/03/13   Invasive Ductal Carcinoma/Ductal Carcinoma Insitu  biopsies   Chronic low back pain 01/24/2016   -and R hip pain -seen by rheumatologist and ortho in the past -MRI with Dr. Lorin Mercy in 2015, R hip plain films  in 2015 as well acupuncture, injections, tramadol not helpful trigger [facility] and PT helpful    Degenerative joint disease    back neck and shoulder   Fibromyalgia    Dr. Estanislado Pandy   GERD (gastroesophageal reflux disease)    hx esophagitis    Hot flashes    Hypertension    Leukopenia 06/16/2013   Lupus [facility])    rheumatologist - Dr. Estanislado Pandy   Migraines    Neuromuscular disorder [facility])    neuropathy from chemo in feet/hands   Personal history of chemotherapy    2015   Personal history of radiation therapy 2015   Right Breast Cancer   S/P radiation therapy 04/27/2014-06/07/2014   1) Right breast, 50 Gy in 25 fractions/ 2) Right breast boost, 10 Gy in 5 fractions    Family History  Problem Relation Age of Onset   Hypertension Mother    Dementia Mother        small vessel disease   Cancer Mother 41       cervical  cancer   Cancer Father 37       prostate ca   Diabetes Father    Arthritis Father    Cancer Half-Sister    Breast cancer Neg Hx    Past Surgical History:  Procedure Laterality Date   BREAST BIOPSY Left 2015   benign core   BREAST BIOPSY Right 2015   malignant core    BREAST LUMPECTOMY Right 2015   BREAST SURGERY  03/01/2014   hx right breast cancer- 3 lymph nodes removed and lumpectomy   CARPAL TUNNEL RELEASE  1998   rt   CESAREAN SECTION     x 3   PORTACATH PLACEMENT  08/2013   still currently active    ROBOTIC ASSISTED BILATERAL SALPINGO OOPHERECTOMY Bilateral 07/09/2014   Procedure: ROBOTIC ASSISTED BILATERAL SALPINGO OOPHORECTOM; :LYSIS OF ADHESIONS;  Surgeon: Marvene Staff, MD;  Location: [facility];  Service: Gynecology;  Laterality: Bilateral;   TRIGGER FINGER RELEASE  2004   rt thumb   trigger finger release left thumb  06/28/14   at orthopedic surgical center   [place] EXTRACTION     Social History   Social History Narrative   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      Home Situation: lives with husband and 53 yo son      Spiritual Beliefs: Christian      Lifestyle: elliptical 3-4 times per week; working on diet as well over last year in 2014            Immunization History  Administered Date(s) Administered   Influenza Split 06/16/2012   Influenza,inj,Quad  PF,6+ Mos 06/17/2013, 04/23/2014, 04/25/2015, 05/17/2017, 05/05/2019, 06/30/2021   PFIZER(Purple Top)SARS-COV-2 Vaccination 10/09/2019, 11/02/2019   Pneumococcal Polysaccharide-23 02/16/2015     Objective: Vital Signs: BP (!) 133/91 (BP Location: Left Arm, Patient Position: Sitting, Cuff Size: Large)    Pulse 82    Ht _0  (1.626 m)    Wt 179 lb (81.2 kg)    LMP 09/28/2013 (Approximate)    BMI 30.73 kg/m    Physical Exam Vitals and nursing note reviewed.  Constitutional:      Appearance: She is well-developed.  HENT:     Head: Normocephalic and atraumatic.  Eyes:     Conjunctiva/sclera: Conjunctivae normal.  Cardiovascular:     Rate and Rhythm: Normal rate and regular rhythm.     Heart sounds: Normal heart  sounds.  Pulmonary:     Effort: Pulmonary effort is normal.     Breath sounds: Normal breath sounds.  Abdominal:     General: Bowel sounds are normal.     Palpations: Abdomen is soft.  Musculoskeletal:     Cervical back: Normal range of motion.  Skin:    General: Skin is warm and dry.     Capillary Refill: Capillary refill takes less than 2 seconds.  Neurological:     Mental Status: She is alert and oriented to person, place, and time.  Psychiatric:        Behavior: Behavior normal.     Musculoskeletal Exam: C-spine, thoracic spine, and lumbar spine good ROM.  Shoulder joints, elbow joints, wrist joints, MCPs, PIPs, DIPs have good range of motion with no synovitis.  PIP and DIP thickening consistent with osteoarthritis of both hands.  Complete fist formation bilaterally.  Hip joints have slightly limited range of motion especially on the right side.  No groin pain currently.  Knee joints have good range of motion with no warmth or effusion.  Ankle joints have good range of motion with no tenderness or joint swelling.  CDAI Exam: CDAI Score: -- Patient Global: --; Provider Global: -- Swollen: --; Tender: -- Joint Exam 09/13/2021   No joint exam has been documented for  this visit   There is currently no information documented on the homunculus. Go to the Rheumatology activity and complete the homunculus joint exam.  Investigation: No additional findings.  Imaging: No results found.  Recent Labs: Lab Results  Component Value Date   WBC 4.1 04/13/2021   HGB 13.8 04/13/2021   PLT 248 04/13/2021   NA 140 04/13/2021   K 3.7 04/13/2021   CL 101 04/13/2021   CO2 31 04/13/2021   GLUCOSE 102 (H) 04/13/2021   BUN 13 04/13/2021   CREATININE 0.97 04/13/2021   BILITOT 0.9 04/13/2021   ALKPHOS 50 11/22/2020   AST 23 04/13/2021   ALT 19 04/13/2021   PROT 7.8 04/13/2021   ALBUMIN 4.4 11/22/2020   CALCIUM 10.0 04/13/2021   GFRAA 64 11/03/2020    Speciality Comments: PLQ eye exam: 03/20/2021 WNL [facility]. Follow up in 6 months.  Procedures:  No procedures performed Allergies: Adhesive [tape], Other, and Nickel     Assessment / Plan:     Visit Diagnoses: Autoimmune disease ([place]) - + ANA 1:320 Speckled-patient had past Hx of neutropenia, fatigue, oral ulcers, sicca symptoms, parotitis, positive anti-CCP: She has not had any signs or symptoms of a flare since her last office visit.  She has clinically been doing well taking Plaquenil 200 mg 1 tablet by mouth daily.  She is tolerating Plaquenil without any side effects and has not missed any doses recently.  She has no synovitis on examination today.  She has not had any recent rashes, photosensitivity, Raynaud's phenomenon, signs of alopecia, oral or nasal ulcerations, or sicca symptoms.  She has not had any shortness of breath, pleuritic chest pain, or palpitations.  Her lungs were clear to auscultation on examination today.  She has been increasing her exercise regimen to work on muscle strengthening as well as weight loss.  Overall her energy level has improved and she has been sleeping well at night. Lab work from 04/13/2021 was reviewed today in the office: Double-stranded DNA was 9, ESR  within normal limits, complements within normal limits.  The following lab work will be updated today. She will remain on Plaquenil as  prescribed.  A refill was sent to the pharmacy today.  She was advised to notify us if she develops any new or worsening symptoms.  She will follow-up in the office in 5 months. - Plan: CBC with Differential/Platelet, COMPLETE METABOLIC PANEL WITH GFR, Urinalysis, Routine w reflex microscopic, Anti-DNA antibody, double-stranded, C3 and C4, Sedimentation rate, hydroxychloroquine (PLAQUENIL) 200 MG tablet  High risk medication use - Plaquenil 200 mg 1 tablet by mouth daily. PLQ eye exam: 03/20/2021.  CBC and CMP updated on 04/13/2021 and results were discussed with the patient today in the office.  CBC and CMP were updated today to monitor for drug toxicity.- Plan: CBC with Differential/Platelet, COMPLETE METABOLIC PANEL WITH GFR  Cyclic citrullinated peptide (CCP) antibody positive - She has no clinical features of rheumatoid arthritis.  No joint tenderness or synovitis was noted on examination today.  Primary osteoarthritis of both hands: She has PIP and DIP thickening consistent with osteoarthritis of both hands.  No tenderness or inflammation was noted on examination today.  She was able to make a complete fist bilaterally.  Discussed the importance of joint protection and muscle strengthening.  Primary osteoarthritis of both hips: She has limited range of motion of both hip joints especially in the right hip.  No groin pain currently.  Primary osteoarthritis of both feet: She is not experiencing any discomfort in her feet at this time.  She has good range of motion of both ankle joints with no tenderness or joint swelling.  She has been increasing her exercise regimen at the gym and experiences occasional discomfort in her ankle joints.  DDD (degenerative disc disease), cervical: She has good range of motion of the C-spine with no discomfort at this time.  No symptoms  of radiculopathy.  Trapezius muscle spasm - Resolved  Cervicogenic headache - Resolved  Chronic right SI joint pain: Resolved   Fibromyalgia: She is not experiencing any signs or symptoms of a fibromyalgia flare at this time.  She is not having any myalgias or muscle tenderness.  Her energy level has improved.  She has been increasing her exercise level.  She has been sleeping well at night.  Osteopenia, unspecified location - DXA done by her GYN on 02/08/2020 showed a T score of -1.6, BMD 0.979 in the lumbar region.  Due to update DEXA in July 2023.   Other medical conditions are listed as follows:   Essential hypertension  Peripheral neuropathy due to chemotherapy ([place]) - Followed by Dr.Aquino  Malignant neoplasm of lower-outer quadrant of right breast of female, estrogen receptor positive ([place])  Orders: Orders Placed This Encounter  Procedures   CBC with Differential/Platelet   COMPLETE METABOLIC PANEL WITH GFR   Urinalysis, Routine w reflex microscopic   Anti-DNA antibody, double-stranded   C3 and C4   Sedimentation rate   Meds ordered this encounter  Medications   hydroxychloroquine (PLAQUENIL) 200 MG tablet    Sig: Take 1 tablet (200 mg total) by mouth daily.    Dispense:  90 tablet    Refill:  0   Follow-Up Instructions: Return in about 5 months (around 02/10/2022) for Autoimmune Disease.   Ofilia Neas, PA-C  Note - This record has been created using Dragon software.  Chart creation errors have been sought, but may not always  have been located. Such creation errors do not reflect on  the standard of medical care.

## 2021-09-13 ENCOUNTER — Ambulatory Visit (INDEPENDENT_AMBULATORY_CARE_PROVIDER_SITE_OTHER): Payer: BC Managed Care – PPO | Admitting: Physician Assistant

## 2021-09-13 ENCOUNTER — Other Ambulatory Visit: Payer: Self-pay

## 2021-09-13 ENCOUNTER — Encounter: Payer: Self-pay | Admitting: Physician Assistant

## 2021-09-13 VITALS — BP 133/91 | HR 82 | Ht 64.0 in | Wt 179.0 lb

## 2021-09-13 DIAGNOSIS — M160 Bilateral primary osteoarthritis of hip: Secondary | ICD-10-CM

## 2021-09-13 DIAGNOSIS — Z170 Estrogen receptor positive status [ER+]: Secondary | ICD-10-CM

## 2021-09-13 DIAGNOSIS — M8580 Other specified disorders of bone density and structure, unspecified site: Secondary | ICD-10-CM | POA: Diagnosis not present

## 2021-09-13 DIAGNOSIS — M19072 Primary osteoarthritis, left ankle and foot: Secondary | ICD-10-CM

## 2021-09-13 DIAGNOSIS — M5030 Other cervical disc degeneration, unspecified cervical region: Secondary | ICD-10-CM | POA: Diagnosis not present

## 2021-09-13 DIAGNOSIS — G4486 Cervicogenic headache: Secondary | ICD-10-CM

## 2021-09-13 DIAGNOSIS — G620 Drug-induced polyneuropathy: Secondary | ICD-10-CM

## 2021-09-13 DIAGNOSIS — M19041 Primary osteoarthritis, right hand: Secondary | ICD-10-CM | POA: Diagnosis not present

## 2021-09-13 DIAGNOSIS — R768 Other specified abnormal immunological findings in serum: Secondary | ICD-10-CM | POA: Diagnosis not present

## 2021-09-13 DIAGNOSIS — T451X5A Adverse effect of antineoplastic and immunosuppressive drugs, initial encounter: Secondary | ICD-10-CM

## 2021-09-13 DIAGNOSIS — M797 Fibromyalgia: Secondary | ICD-10-CM

## 2021-09-13 DIAGNOSIS — M359 Systemic involvement of connective tissue, unspecified: Secondary | ICD-10-CM

## 2021-09-13 DIAGNOSIS — M19071 Primary osteoarthritis, right ankle and foot: Secondary | ICD-10-CM | POA: Diagnosis not present

## 2021-09-13 DIAGNOSIS — M533 Sacrococcygeal disorders, not elsewhere classified: Secondary | ICD-10-CM

## 2021-09-13 DIAGNOSIS — I10 Essential (primary) hypertension: Secondary | ICD-10-CM

## 2021-09-13 DIAGNOSIS — G8929 Other chronic pain: Secondary | ICD-10-CM

## 2021-09-13 DIAGNOSIS — Z79899 Other long term (current) drug therapy: Secondary | ICD-10-CM | POA: Diagnosis not present

## 2021-09-13 DIAGNOSIS — C50511 Malignant neoplasm of lower-outer quadrant of right female breast: Secondary | ICD-10-CM

## 2021-09-13 DIAGNOSIS — M62838 Other muscle spasm: Secondary | ICD-10-CM | POA: Diagnosis not present

## 2021-09-13 DIAGNOSIS — M19042 Primary osteoarthritis, left hand: Secondary | ICD-10-CM

## 2021-09-13 MED ORDER — HYDROXYCHLOROQUINE SULFATE 200 MG PO TABS: 200.0000 mg | ORAL_TABLET | Freq: Every day | ORAL | 0 refills | Status: DC

## 2021-09-14 ENCOUNTER — Ambulatory Visit: Payer: BC Managed Care – PPO | Admitting: Rheumatology

## 2021-09-14 ENCOUNTER — Telehealth: Payer: Self-pay | Admitting: *Deleted

## 2021-09-14 DIAGNOSIS — M359 Systemic involvement of connective tissue, unspecified: Secondary | ICD-10-CM

## 2021-09-14 DIAGNOSIS — Z79899 Other long term (current) drug therapy: Secondary | ICD-10-CM

## 2021-09-14 LAB — CBC WITH DIFFERENTIAL/PLATELET
Absolute Monocytes: 210 cells/uL (ref 200–950)
Basophils Absolute: 32 cells/uL (ref 0–200)
Basophils Relative: 0.9 %
Eosinophils Absolute: 60 cells/uL (ref 15–500)
Eosinophils Relative: 1.7 %
HCT: 39.7 % (ref 35.0–45.0)
Hemoglobin: 13.5 g/dL (ref 11.7–15.5)
Lymphs Abs: 2023 cells/uL (ref 850–3900)
MCH: 30.8 pg (ref 27.0–33.0)
MCHC: 34 g/dL (ref 32.0–36.0)
MCV: 90.4 fL (ref 80.0–100.0)
MPV: 10.3 fL (ref 7.5–12.5)
Monocytes Relative: 6 %
Neutro Abs: 1176 cells/uL — ABNORMAL LOW (ref 1500–7800)
Neutrophils Relative %: 33.6 %
Platelets: 216 10*3/uL (ref 140–400)
RBC: 4.39 10*6/uL (ref 3.80–5.10)
RDW: 13.1 % (ref 11.0–15.0)
Total Lymphocyte: 57.8 %
WBC: 3.5 10*3/uL — ABNORMAL LOW (ref 3.8–10.8)

## 2021-09-14 LAB — COMPLETE METABOLIC PANEL WITH GFR
AG Ratio: 1.3 (calc) (ref 1.0–2.5)
ALT: 22 U/L (ref 6–29)
AST: 24 U/L (ref 10–35)
Albumin: 4.6 g/dL (ref 3.6–5.1)
Alkaline phosphatase (APISO): 83 U/L (ref 37–153)
BUN/Creatinine Ratio: 15 (calc) (ref 6–22)
BUN: 16 mg/dL (ref 7–25)
CO2: 27 mmol/L (ref 20–32)
Calcium: 9.8 mg/dL (ref 8.6–10.4)
Chloride: 102 mmol/L (ref 98–110)
Creat: 1.08 mg/dL — ABNORMAL HIGH (ref 0.50–1.03)
Globulin: 3.6 g/dL (calc) (ref 1.9–3.7)
Glucose, Bld: 83 mg/dL (ref 65–99)
Potassium: 3.3 mmol/L — ABNORMAL LOW (ref 3.5–5.3)
Sodium: 138 mmol/L (ref 135–146)
Total Bilirubin: 0.9 mg/dL (ref 0.2–1.2)
Total Protein: 8.2 g/dL — ABNORMAL HIGH (ref 6.1–8.1)
eGFR: 60 mL/min/{1.73_m2} (ref >=60)

## 2021-09-14 LAB — URINALYSIS, ROUTINE W REFLEX MICROSCOPIC
Bilirubin Urine: NEGATIVE
Glucose, UA: NEGATIVE
Hgb urine dipstick: NEGATIVE
Ketones, ur: NEGATIVE
Leukocytes,Ua: NEGATIVE
Nitrite: NEGATIVE
Protein, ur: NEGATIVE
Specific Gravity, Urine: 1.011 (ref 1.001–1.035)
pH: 5.5 (ref 5.0–8.0)

## 2021-09-14 LAB — SEDIMENTATION RATE: Sed Rate: 28 mm/h (ref 0–30)

## 2021-09-14 LAB — ANTI-DNA ANTIBODY, DOUBLE-STRANDED: ds DNA Ab: 11 IU/mL — ABNORMAL HIGH

## 2021-09-14 LAB — C3 AND C4
C3 Complement: 157 mg/dL (ref 83–193)
C4 Complement: 25 mg/dL (ref 15–57)

## 2021-09-14 NOTE — Telephone Encounter (Signed)
-----   Message from Ofilia Neas, PA-C sent at 09/14/2021  2:33 PM EST ----- dsDNA is borderline positive but has trended up slightly.  Recommend repeating lab work in 3 months including hydroxychloroquine level.

## 2021-09-14 NOTE — Progress Notes (Signed)
WBC count is slightly low.  Absolute neutrophils are low.  Rest of CBC  WNL.  We will continue to monitor.   Creatinine is borderline elevated-1.08.  GFR is WNL-60.  Please encourage the patient to remain hydrated.   Potassium is low-3.3.  please clarify if she has had any diarrhea/vomiting?   UA normal.  ESR and complements WNL.

## 2021-09-14 NOTE — Progress Notes (Signed)
dsDNA is borderline positive but has trended up slightly.  Recommend repeating lab work in 3 months including hydroxychloroquine level.

## 2021-09-18 ENCOUNTER — Encounter: Payer: Self-pay | Admitting: Family Medicine

## 2021-09-18 DIAGNOSIS — H524 Presbyopia: Secondary | ICD-10-CM | POA: Diagnosis not present

## 2021-09-18 DIAGNOSIS — Z79899 Other long term (current) drug therapy: Secondary | ICD-10-CM | POA: Diagnosis not present

## 2021-09-18 DIAGNOSIS — M3210 Systemic lupus erythematosus, organ or system involvement unspecified: Secondary | ICD-10-CM | POA: Diagnosis not present

## 2021-09-18 DIAGNOSIS — H04123 Dry eye syndrome of bilateral lacrimal glands: Secondary | ICD-10-CM | POA: Diagnosis not present

## 2021-09-18 DIAGNOSIS — H25813 Combined forms of age-related cataract, bilateral: Secondary | ICD-10-CM | POA: Diagnosis not present

## 2021-09-18 LAB — HM DIABETES EYE EXAM

## 2021-09-22 ENCOUNTER — Other Ambulatory Visit: Payer: Self-pay | Admitting: Family Medicine

## 2021-09-22 DIAGNOSIS — C50511 Malignant neoplasm of lower-outer quadrant of right female breast: Secondary | ICD-10-CM

## 2021-10-18 NOTE — Progress Notes (Signed)
? ?[name] is a 61 y.o.female, who is here today for follow up. ? ?Last follow up visit: 06/30/21 ?Hypertension:  ?Medications:She stopped Amlodipine after a week or so and resumed Lisinopril-HCTZ 20-12.5 mg daily in 06/2021. She did not have side effects from Lisinopril but was concerned about increasing risk of CKD. ?BP readings at home:120's/80 ?Side effects:none. She has had occasional non productive cough, she does not think it is caused by Lisinopril. ? ?Negative for unusual or severe headache, visual changes, exertional chest pain, dyspnea,  focal weakness, or edema. ?Mild hypoK+, she was having diarrhea the day before labs. ? ?Lab Results  ?Component Value Date  ? CREATININE 1.08 (H) 09/13/2021  ? BUN 16 09/13/2021  ? NA 138 09/13/2021  ? K 3.3 (L) 09/13/2021  ? CL 102 09/13/2021  ? CO2 27 09/13/2021  ? ?Hyperlipidemia: Currently she is on nonpharmacologic treatment. ? ?Lab Results  ?Component Value Date  ? CHOL 262 (H) 12/20/2020  ? HDL 76.20 12/20/2020  ? [place] 153 (H) 12/20/2020  ? LDLDIRECT 123.0 10/26/2014  ? TRIG 165.0 (H) 12/20/2020  ? CHOLHDL 3 12/20/2020  ? ?Review of Systems  ?Constitutional:  Negative for activity change, appetite change and fever.  ?HENT:  Negative for mouth sores, nosebleeds and sore throat.   ?Eyes:  Negative for redness and visual disturbance.  ?Respiratory:  Negative for wheezing.   ?Gastrointestinal:  Negative for abdominal pain, nausea and vomiting.  ?     Negative for changes in bowel habits.  ?Genitourinary:  Negative for decreased urine volume and hematuria.  ?Musculoskeletal:  Negative for gait problem and myalgias.  ?Skin:  Negative for rash.  ?Neurological:  Negative for syncope and facial asymmetry.  ?Rest see pertinent positives and negatives per HPI. ? ?Current Outpatient Medications on File Prior to Visit  ?Medication Sig Dispense Refill  ? Accu-Chek Softclix Lancets lancets Use to check blood sugars 1-2 times daily. 100 each 12  ? albuterol (VENTOLIN  HFA) 108 (90 Base) MCG/ACT inhaler Inhale 2 puffs into the lungs every 6 (six) hours as needed for wheezing or shortness of breath. 8 g 0  ? aspirin 81 MG tablet Take 81 mg by mouth daily.    ? b complex vitamins capsule Take 1 capsule by mouth daily.    ? BIOTIN 5000 PO Take 1 capsule by mouth. Takes sometimes    ? Blood Glucose Monitoring Suppl (ACCU-CHEK GUIDE) w/Device KIT Use to check blood sugars 1-2 times daily. 1 kit 0  ? Calcium Carbonate-Vit D-Min (CALTRATE 600+D PLUS MINERALS PO) Take 1 tablet by mouth daily.    ? Cholecalciferol (VITAMIN D3 PO) Take by mouth.    ? gabapentin (NEURONTIN) 600 MG tablet Take 1 tablet (600 mg total) by mouth 3 (three) times daily. 270 tablet 3  ? glucose blood (ACCU-CHEK GUIDE) test strip Use to check blood sugars 1-2 times daily. 100 each 12  ? HEMP OIL-VANILLYL BUTYL ETHER EX Apply topically as needed.    ? hydroxychloroquine (PLAQUENIL) 200 MG tablet Take 1 tablet (200 mg total) by mouth daily. 90 tablet 0  ? loratadine (CLARITIN) 10 MG tablet Take 10 mg by mouth as needed.    ? MAGNESIUM PO Take by mouth daily.    ? omeprazole (PRILOSEC) 40 MG capsule TAKE 1 CAPSULE BY MOUTH ONCE DAILY BEFORE BREAKFAST 90 capsule 2  ? Propylene Glycol (SYSTANE COMPLETE OP) Apply to eye.    ? SUMAtriptan (IMITREX) 25 MG tablet Take 1 tablet (25 mg total)  by mouth every 2 (two) hours as needed for migraine. May repeat in 2 hours if needed. Do not take more then 2 tablets in 24 hours. 10 tablet 11  ? TURMERIC PO Take by mouth daily.    ? UNABLE TO FIND Apply topically as needed. THC oil    ? ?No current facility-administered medications on file prior to visit.  ? ?Past Medical History:  ?Diagnosis Date  ? Anxiety   ? Breast cancer ([place]) 2015  ? Right Breast Cancer  ? Cancer of right breast ([place]) 09/03/13  ? Invasive Ductal Carcinoma/Ductal Carcinoma Insitu  biopsies  ? Chronic low back pain 01/24/2016  ? -and R hip pain -seen by rheumatologist and ortho in the past -MRI with Dr. Lorin Mercy in 2015,  R hip plain films in 2015 as well acupuncture, injections, tramadol not helpful trigger therapy at [facility] and PT helpful   ? Degenerative joint disease   ? back neck and shoulder  ? Fibromyalgia   ? Dr. Estanislado Pandy  ? GERD (gastroesophageal reflux disease)   ? hx esophagitis  ? Hot flashes   ? Hypertension   ? Leukopenia 06/16/2013  ? Lupus ([place])   ? rheumatologist - Dr. Estanislado Pandy  ? Migraines   ? Neuromuscular disorder ([place])   ? 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 Personal history of chemotherapy   ? 2015  ? Personal history of radiation therapy 2015  ? Right Breast Cancer  ? S/P radiation therapy 04/27/2014-06/07/2014  ? 1) Right breast, 50 Gy in 25 fractions/ 2) Right breast boost, 10 Gy in 5 fractions  ? ? ?Allergies  ?Allergen Reactions  ? Adhesive [Tape] Itching  ?  Burned and scarred skinned  ? Other   ?  Cholroprep-causes rash  ? Nickel Rash  ? ? ?Social History  ? ?Socioeconomic History  ? Marital status: Married  ?  Spouse name: Not on file  ? Number of children: 1  ? Years of education: Not on file  ? Highest education level: Not on file  ?Occupational History  ?  Comment: homemaker  ?Tobacco Use  ? Smoking status: Former  ?  Packs/day: 1.00  ?  Years: 15.00  ?  Pack years: 15.00  ?  Types: Cigars, Cigarettes  ?  Quit date: 02/23/2004  ?  Years since quitting: 17.6  ? Smokeless tobacco: Never  ? Tobacco comments:  ?  remote smoking history  ?Vaping Use  ? Vaping Use: Never used  ?Substance and Sexual Activity  ? Alcohol use: Yes  ?  Alcohol/week: 0.0 standard drinks  ?  Comment: rare  ? Drug use: Yes  ?  Types: Marijuana  ?  Comment: Marijuana as needed   ? Sexual activity: Yes  ?Other Topics Concern  ? Not on file  ?Social History Narrative  ? 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  ?   ? Home Situation: lives with husband and 86 yo son  ?   ? Spiritual Beliefs: Christian  ?   ? Lifestyle: elliptical 3-4 times per week; working on diet as well over last year in 2014  ?   ?   ?   ? ?Social Determinants of  Health  ? ?Financial Resource Strain: Low Risk   ? Difficulty of Paying Living Expenses: Not hard at all  ?Food Insecurity: No Food Insecurity  ? Worried About Charity fundraiser in the Last Year: Never true  ? Ran Out of Food in the Last Year: Never  true  ?Transportation Needs: No Transportation Needs  ? Lack of Transportation (Medical): No  ? Lack of Transportation (Non-Medical): No  ?Physical Activity: Sufficiently Active  ? Days of Exercise per Week: 5 days  ? Minutes of Exercise per Session: 30 min  ?Stress: Stress Concern Present  ? Feeling of Stress : To some extent  ?Social Connections: Socially Integrated  ? Frequency of Communication with Friends and Family: More than three times a week  ? Frequency of Social Gatherings with Friends and Family: More than three times a week  ? Attends Religious Services: More than 4 times per year  ? Active Member of Clubs or Organizations: Yes  ? Attends Archivist Meetings: 1 to 4 times per year  ? Marital Status: Married  ? ?Vitals:  ? 10/20/21 1022  ?BP: 120/82  ?Pulse: 90  ?Resp: 16  ?Temp: 99.5 ?F (37.5 ?C)  ?SpO2: 98%  ? ?Body mass index is 29.87 kg/m?. ? ?Physical Exam ?Vitals and nursing note reviewed.  ?Constitutional:   ?   General: She is not in acute distress. ?   Appearance: She is well-developed.  ?HENT:  ?   Head: Normocephalic and atraumatic.  ?   Mouth/Throat:  ?   Mouth: Mucous membranes are moist.  ?   Pharynx: Oropharynx is clear.  ?Eyes:  ?   Conjunctiva/sclera: Conjunctivae normal.  ?Cardiovascular:  ?   Rate and Rhythm: Normal rate and regular rhythm.  ?   Pulses:     ?     Dorsalis pedis pulses are 2+ on the right side and 2+ on the left side.  ?   Heart sounds: No murmur heard. ?Pulmonary:  ?   Effort: Pulmonary effort is normal. No respiratory distress.  ?   Breath sounds: Normal breath sounds.  ?Abdominal:  ?   Palpations: Abdomen is soft. There is no hepatomegaly or mass.  ?   Tenderness: There is no abdominal tenderness.   ?Lymphadenopathy:  ?   Cervical: No cervical adenopathy.  ?Skin: ?   General: Skin is warm.  ?   Findings: No erythema or rash.  ?Neurological:  ?   General: No focal deficit present.  ?   Mental Status: She

## 2021-10-20 ENCOUNTER — Encounter: Payer: Self-pay | Admitting: Family Medicine

## 2021-10-20 ENCOUNTER — Ambulatory Visit (INDEPENDENT_AMBULATORY_CARE_PROVIDER_SITE_OTHER): Payer: BC Managed Care – PPO | Admitting: Family Medicine

## 2021-10-20 VITALS — BP 120/82 | HR 90 | Temp 99.5°F | Resp 16 | Ht 64.0 in | Wt 174.0 lb

## 2021-10-20 DIAGNOSIS — I10 Essential (primary) hypertension: Secondary | ICD-10-CM | POA: Diagnosis not present

## 2021-10-20 DIAGNOSIS — E785 Hyperlipidemia, unspecified: Secondary | ICD-10-CM

## 2021-10-20 DIAGNOSIS — E876 Hypokalemia: Secondary | ICD-10-CM | POA: Diagnosis not present

## 2021-10-20 MED ORDER — LISINOPRIL-HYDROCHLOROTHIAZIDE 20-12.5 MG PO TABS: 1.0000 | ORAL_TABLET | Freq: Every day | ORAL | 2 refills | Status: DC

## 2021-10-20 NOTE — Patient Instructions (Addendum)
A few things to remember from today's visit: ? ?Essential hypertension ? ?Hypokalemia ? ?Hyperlipidemia, unspecified hyperlipidemia type ? ?If you need refills please call your pharmacy. ?Do not use My Chart to request refills or for acute issues that need immediate attention. ?  ?Please be sure medication list is accurate. ?If a new problem present, please set up appointment sooner than planned today. ? ?No changes today. ?Will check fasting labs next visit. ? ? ? ? ? ?

## 2021-10-20 NOTE — Assessment & Plan Note (Signed)
She is on nonpharmacologic treatment, she is not interested in starting medication. ?We discussed some CV benefits of statins. ?Today she is not fasting, so we will plan on checking lipid panel at next visit. ?

## 2021-10-20 NOTE — Assessment & Plan Note (Addendum)
BP is adequately controlled. ?Continue lisinopril-HCTZ 20-12.5 mg daily, we discussed some side effects. If cough is persistent, we may need to consider changing to a different antihypertensive medication. ?Continue monitoring BP regularly and following low-salt diet. ?She is having the lab work regularly at her rheumatologist's office. ?

## 2021-11-02 ENCOUNTER — Telehealth: Payer: Self-pay | Admitting: Family Medicine

## 2021-11-02 NOTE — Telephone Encounter (Signed)
Left message for patient to call back and schedule Medicare Annual Wellness Visit (AWV) either virtually or in office. Left  my jabber number [phone]   Last AWV ;11/07/20  please schedule at anytime with [facility] Nurse Health Advisor 1 or 2    

## 2021-11-06 ENCOUNTER — Telehealth: Payer: Self-pay | Admitting: Family Medicine

## 2021-11-06 NOTE — Telephone Encounter (Signed)
Left message for patient to call back and schedule Medicare Annual Wellness Visit (AWV) either virtually or in office. Left  my jabber number [phone]   Last AWV ;11/07/20  please schedule at anytime with [facility] Nurse Health Advisor 1 or 2    

## 2021-11-08 ENCOUNTER — Ambulatory Visit (INDEPENDENT_AMBULATORY_CARE_PROVIDER_SITE_OTHER): Payer: BC Managed Care – PPO

## 2021-11-08 VITALS — Ht 64.0 in | Wt 174.0 lb

## 2021-11-08 DIAGNOSIS — Z0000 Encounter for general adult medical examination without abnormal findings: Secondary | ICD-10-CM | POA: Diagnosis not present

## 2021-11-08 NOTE — Patient Instructions (Addendum)
?Ms. Spadoni , ?Thank you for taking time to come for your Medicare Wellness Visit. I appreciate your ongoing commitment to your health goals. Please review the following plan we discussed and let me know if I can assist you in the future.  ? ?These are the goals we discussed: ? Goals   ? ?   Weight (lb) < 200 lb (90.7 kg) (pt-stated)   ?   Lose weight and be the best me I can be. Maintain good health. ?  ? ?  ?  ?This is a list of the screening recommended for you and due dates:  ?Health Maintenance  ?Topic Date Due  ? Pap Smear  09/28/2021  ? COVID-19 Vaccine (3 - Pfizer risk series) 11/24/2021*  ? Zoster (Shingles) Vaccine (1 of 2) 02/07/2022*  ? Complete foot exam   12/28/2021  ? Tetanus Vaccine  12/28/2021  ? Hemoglobin A1C  12/29/2021  ? Flu Shot  02/27/2022  ? Eye exam for diabetics  09/18/2022  ? Mammogram  02/23/2023  ? Colon Cancer Screening  01/01/2026  ? Hepatitis C Screening: USPSTF Recommendation to screen - Ages 20-79 yo.  Completed  ? HIV Screening  Completed  ? HPV Vaccine  Aged Out  ?*Topic was postponed. The date shown is not the original due date.  ? ?Advanced directives: No ? ?Conditions/risks identified: None ? ?Next appointment: Follow up in one year for your annual wellness visit.  ? ?Preventive Care 40-64 Years, Female ?Preventive care refers to lifestyle choices and visits with your health care provider that can promote health and wellness. ?What does preventive care include? ?A yearly physical exam. This is also called an annual well check. ?Dental exams once or twice a year. ?Routine eye exams. Ask your health care provider how often you should have your eyes checked. ?Personal lifestyle choices, including: ?Daily care of your teeth and gums. ?Regular physical activity. ?Eating a healthy diet. ?Avoiding tobacco and drug use. ?Limiting alcohol use. ?Practicing safe sex. ?Taking low-dose aspirin daily starting at age 87. ?Taking vitamin and mineral supplements as recommended by your  health care provider. ?What happens during an annual well check? ?The services and screenings done by your health care provider during your annual well check will depend on your age, overall health, lifestyle risk factors, and family history of disease. ?Counseling  ?Your health care provider may ask you questions about your: ?Alcohol use. ?Tobacco use. ?Drug use. ?Emotional well-being. ?Home and relationship well-being. ?Sexual activity. ?Eating habits. ?Work and work Statistician. ?Method of birth control. ?Menstrual cycle. ?Pregnancy history. ?Screening  ?You may have the following tests or measurements: ?Height, weight, and BMI. ?Blood pressure. ?Lipid and cholesterol levels. These may be checked every 5 years, or more frequently if you are over 32 years old. ?Skin check. ?Lung cancer screening. You may have this screening every year starting at age 52 if you have a 30-pack-year history of smoking and currently smoke or have quit within the past 15 years. ?Fecal occult blood test (FOBT) of the stool. You may have this test every year starting at age 30. ?Flexible sigmoidoscopy or colonoscopy. You may have a sigmoidoscopy every 5 years or a colonoscopy every 10 years starting at age 68. ?Hepatitis C blood test. ?Hepatitis B blood test. ?Sexually transmitted disease (STD) testing. ?Diabetes screening. This is done by checking your blood sugar (glucose) after you have not eaten for a while (fasting). You may have this done every 1-3 years. ?Mammogram. This may be done every 1-2  years. Talk to your health care provider about when you should start having regular mammograms. This may depend on whether you have a family history of breast cancer. ?BRCA-related cancer screening. This may be done if you have a family history of breast, ovarian, tubal, or peritoneal cancers. ?Pelvic exam and Pap test. This may be done every 3 years starting at age 8. Starting at age 41, this may be done every 5 years if you have a Pap test  in combination with an HPV test. ?Bone density scan. This is done to screen for osteoporosis. You may have this scan if you are at high risk for osteoporosis. ?Discuss your test results, treatment options, and if necessary, the need for more tests with your health care provider. ?Vaccines  ?Your health care provider may recommend certain vaccines, such as: ?Influenza vaccine. This is recommended every year. ?Tetanus, diphtheria, and acellular pertussis (Tdap, Td) vaccine. You may need a Td booster every 10 years. ?Zoster vaccine. You may need this after age 43. ?Pneumococcal 13-valent conjugate (PCV13) vaccine. You may need this if you have certain conditions and were not previously vaccinated. ?Pneumococcal polysaccharide (PPSV23) vaccine. You may need one or two doses if you smoke cigarettes or if you have certain conditions. ?Talk to your health care provider about which screenings and vaccines you need and how often you need them. ?This information is not intended to replace advice given to you by your health care provider. Make sure you discuss any questions you have with your health care provider. ?Document Released: 08/12/2015 Document Revised: 04/04/2016 Document Reviewed: 05/17/2015 ?Elsevier Interactive Patient Education ? 2017 [employer] ? ? ? ?Fall Prevention in the Home ?Falls can cause injuries. They can happen to people of all ages. There are many things you can do to make your home safe and to help prevent falls. ?What can I do on the outside of my home? ?Regularly fix the edges of walkways and driveways and fix any cracks. ?Remove anything that might make you trip as you walk through a door, such as a raised step or threshold. ?Trim any bushes or trees on the path to your home. ?Use bright outdoor lighting. ?Clear any walking paths of anything that might make someone trip, such as rocks or tools. ?Regularly check to see if handrails are loose or broken. Make sure that both sides of any steps have  handrails. ?Any raised decks and porches should have guardrails on the edges. ?Have any leaves, snow, or ice cleared regularly. ?Use sand or salt on walking paths during winter. ?Clean up any spills in your garage right away. This includes oil or grease spills. ?What can I do in the bathroom? ?Use night lights. ?Install grab bars by the toilet and in the tub and shower. Do not use towel bars as grab bars. ?Use non-skid mats or decals in the tub or shower. ?If you need to sit down in the shower, use a plastic, non-slip stool. ?Keep the floor dry. Clean up any water that spills on the floor as soon as it happens. ?Remove soap buildup in the tub or shower regularly. ?Attach bath mats securely with double-sided non-slip rug tape. ?Do not have throw rugs and other things on the floor that can make you trip. ?What can I do in the bedroom? ?Use night lights. ?Make sure that you have a light by your bed that is easy to reach. ?Do not use any sheets or blankets that are too big for your bed. They  should not hang down onto the floor. ?Have a firm chair that has side arms. You can use this for support while you get dressed. ?Do not have throw rugs and other things on the floor that can make you trip. ?What can I do in the kitchen? ?Clean up any spills right away. ?Avoid walking on wet floors. ?Keep items that you use a lot in easy-to-reach places. ?If you need to reach something above you, use a strong step stool that has a grab bar. ?Keep electrical cords out of the way. ?Do not use floor polish or wax that makes floors slippery. If you must use wax, use non-skid floor wax. ?Do not have throw rugs and other things on the floor that can make you trip. ?What can I do with my stairs? ?Do not leave any items on the stairs. ?Make sure that there are handrails on both sides of the stairs and use them. Fix handrails that are broken or loose. Make sure that handrails are as long as the stairways. ?Check any carpeting to make sure  that it is firmly attached to the stairs. Fix any carpet that is loose or worn. ?Avoid having throw rugs at the top or bottom of the stairs. If you do have throw rugs, attach them to the floor with carpet t

## 2021-11-08 NOTE — Progress Notes (Signed)
? ?Subjective:  ? Tina [name] is a 61 y.o. female who presents for Medicare Annual (Subsequent) preventive examination. ? ?Review of Systems    ?Virtual Visit via Telephone Note ? ?I connected with  [name] on 11/08/21 at  9:15 AM EDT by telephone and verified that I am speaking with the correct person using two identifiers. ? ?Location: ?Patient: Home ?Provider: Office ?Persons participating in the virtual visit: patient/Nurse Health Advisor ?  ?I discussed the limitations, risks, security and privacy concerns of performing an evaluation and management service by telephone and the availability of in person appointments. The patient expressed understanding and agreed to proceed. ? ?Interactive audio and video telecommunications were attempted between this nurse and patient, however failed, due to patient having technical difficulties OR patient did not have access to video capability.  We continued and completed visit with audio only. ? ?Some vital signs may be absent or patient reported.  ? ?Tina Peaches, LPN  ?Cardiac Risk Factors include: advanced age (>73mn, >>34women);diabetes mellitus;hypertension ? ?   ?Objective:  ?  ?Today's Vitals  ? 11/08/21 0912  ?Weight: 174 lb (78.9 kg)  ?Height: '5\' 4"'  (1.626 m)  ? ?Body mass index is 29.87 kg/m?. ? ? ?  11/08/2021  ?  9:27 AM 08/18/2021  ? 10:55 AM 11/16/2020  ?  9:21 AM 11/07/2020  ?  3:14 PM 04/23/2017  ?  4:29 PM 01/10/2017  ? 10:19 AM 12/11/2016  ? 10:00 PM  ?Advanced Directives  ?Does Patient Have a Medical Advance Directive? No No No No Yes No No  ?Would patient like information on creating a medical advance directive? No - Patient declined  No - Patient declined No - Patient declined  No - Patient declined No - Patient declined  ? ? ?Current Medications (verified) ?Outpatient Encounter Medications as of 11/08/2021  ?Medication Sig  ? Accu-Chek Softclix Lancets lancets Use to check blood sugars 1-2 times daily.  ? albuterol (VENTOLIN HFA) 108 (90 Base)  MCG/ACT inhaler Inhale 2 puffs into the lungs every 6 (six) hours as needed for wheezing or shortness of breath.  ? aspirin 81 MG tablet Take 81 mg by mouth daily.  ? b complex vitamins capsule Take 1 capsule by mouth daily.  ? BIOTIN 5000 PO Take 1 capsule by mouth. Takes sometimes  ? Blood Glucose Monitoring Suppl (ACCU-CHEK GUIDE) w/Device KIT Use to check blood sugars 1-2 times daily.  ? Calcium Carbonate-Vit D-Min (CALTRATE 600+D PLUS MINERALS PO) Take 1 tablet by mouth daily.  ? Cholecalciferol (VITAMIN D3 PO) Take by mouth.  ? gabapentin (NEURONTIN) 600 MG tablet Take 1 tablet (600 mg total) by mouth 3 (three) times daily.  ? glucose blood (ACCU-CHEK GUIDE) test strip Use to check blood sugars 1-2 times daily.  ? HEMP OIL-VANILLYL BUTYL ETHER EX Apply topically as needed.  ? hydroxychloroquine (PLAQUENIL) 200 MG tablet Take 1 tablet (200 mg total) by mouth daily.  ? lisinopril-hydrochlorothiazide (ZESTORETIC) 20-12.5 MG tablet Take 1 tablet by mouth daily.  ? loratadine (CLARITIN) 10 MG tablet Take 10 mg by mouth as needed.  ? MAGNESIUM PO Take by mouth daily.  ? omeprazole (PRILOSEC) 40 MG capsule TAKE 1 CAPSULE BY MOUTH ONCE DAILY BEFORE BREAKFAST  ? Propylene Glycol (SYSTANE COMPLETE OP) Apply to eye.  ? SUMAtriptan (IMITREX) 25 MG tablet Take 1 tablet (25 mg total) by mouth every 2 (two) hours as needed for migraine. May repeat in 2 hours if needed. Do not take more then 2  tablets in 24 hours.  ? TURMERIC PO Take by mouth daily.  ? UNABLE TO FIND Apply topically as needed. THC oil  ? ?No facility-administered encounter medications on file as of 11/08/2021.  ? ? ?Allergies (verified) ?Adhesive [tape], Other, and Nickel  ? ?History: ?Past Medical History:  ?Diagnosis Date  ? Anxiety   ? Breast cancer ([place]) 2015  ? Right Breast Cancer  ? Cancer of right breast ([place]) 09/03/13  ? Invasive Ductal Carcinoma/Ductal Carcinoma Insitu  biopsies  ? Chronic low back pain 01/24/2016  ? -and R hip pain -seen by  rheumatologist and ortho in the past -MRI with Dr. Lorin Mercy in 2015, R hip plain films in 2015 as well acupuncture, injections, tramadol not helpful trigger therapy [facility] and PT helpful   ? Degenerative joint disease   ? back neck and shoulder  ? Fibromyalgia   ? Dr. Estanislado Pandy  ? GERD (gastroesophageal reflux disease)   ? hx esophagitis  ? Hot flashes   ? Hypertension   ? Leukopenia 06/16/2013  ? Lupus ([place])   ? rheumatologist - Dr. Estanislado Pandy  ? Migraines   ? Neuromuscular disorder ([place])   ? 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 Personal history of chemotherapy   ? 2015  ? Personal history of radiation therapy 2015  ? Right Breast Cancer  ? S/P radiation therapy 04/27/2014-06/07/2014  ? 1) Right breast, 50 Gy in 25 fractions/ 2) Right breast boost, 10 Gy in 5 fractions  ? ?Past Surgical History:  ?Procedure Laterality Date  ? BREAST BIOPSY Left 2015  ? benign core  ? BREAST BIOPSY Right 2015  ? malignant core   ? BREAST LUMPECTOMY Right 2015  ? BREAST SURGERY  03/01/2014  ? hx right breast cancer- 3 lymph nodes removed and lumpectomy  ? [name]  ? rt  ? CESAREAN SECTION    ? x 3  ? PORTACATH PLACEMENT  08/2013  ? still currently active   ? ROBOTIC ASSISTED BILATERAL SALPINGO OOPHERECTOMY Bilateral 07/09/2014  ? Procedure: ROBOTIC ASSISTED BILATERAL SALPINGO OOPHORECTOM; :LYSIS OF ADHESIONS;  Surgeon: Marvene Staff, MD;  Location: [facility];  Service: Gynecology;  Laterality: Bilateral;  ? TRIGGER FINGER RELEASE  2004  ? rt thumb  ? trigger finger release left thumb  06/28/14  ? at orthopedic surgical center  ? TUBAL LIGATION    ? WISDOM TOOTH EXTRACTION    ? ?Family History  ?Problem Relation Age of Onset  ? Hypertension Mother   ? Dementia Mother   ?     small vessel disease  ? Cancer Mother 75  ?     cervical  cancer  ? Cancer Father 29  ?     prostate ca  ? Diabetes Father   ? Arthritis Father   ? Cancer Half-Sister   ? Breast cancer Neg Hx   ? ?Social History  ? ?Socioeconomic  History  ? Marital status: Married  ?  Spouse name: Not on file  ? Number of children: 1  ? Years of education: Not on file  ? Highest education level: Not on file  ?Occupational History  ?  Comment: homemaker  ?Tobacco Use  ? Smoking status: Former  ?  Packs/day: 1.00  ?  Years: 15.00  ?  Pack years: 15.00  ?  Types: Cigars, Cigarettes  ?  Quit date: 02/23/2004  ?  Years since quitting: 17.7  ? Smokeless tobacco: Never  ? Tobacco comments:  ?  remote smoking history  ?Vaping Use  ? Vaping Use: Never used  ?Substance and Sexual Activity  ? Alcohol use: Yes  ?  Alcohol/week: 0.0 standard drinks  ?  Comment: rare  ? Drug use: Yes  ?  Types: Marijuana  ?  Comment: Marijuana as needed   ? Sexual activity: Yes  ?Other Topics Concern  ? Not on file  ?Social History Narrative  ? 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  ?   ? Home Situation: lives with husband and 78 yo son  ?   ? Spiritual Beliefs: Christian  ?   ? Lifestyle: elliptical 3-4 times per week; working on diet as well over last year in 2014  ?   ?   ?   ? ?Social Determinants of Health  ? ?Financial Resource Strain: Low Risk   ? Difficulty of Paying Living Expenses: Not hard at all  ?Food Insecurity: No Food Insecurity  ? Worried About Charity fundraiser in the Last Year: Never true  ? Ran Out of Food in the Last Year: Never true  ?Transportation Needs: No Transportation Needs  ? Lack of Transportation (Medical): No  ? Lack of Transportation (Non-Medical): No  ?Physical Activity: Sufficiently Active  ? Days of Exercise per Week: 5 days  ? Minutes of Exercise per Session: 60 min  ?Stress: No Stress Concern Present  ? Feeling of Stress : Not at all  ?Social Connections: Moderately Isolated  ? Frequency of Communication with Friends and Family: More than three times a week  ? Frequency of Social Gatherings with Friends and Family: More than three times a week  ? Attends Religious Services: Never  ? Active Member of Clubs or Organizations: No  ? Attends Archivist  Meetings: Never  ? Marital Status: Married  ? ? ? ?Clinical Intake: ?Nutrition Risk Assessment: ? ?Has the patient had any N/V/D within the last 2 months?  No  ?Does the patient have any non-healing wounds?  No  ?Has

## 2022-02-13 NOTE — Progress Notes (Signed)
Office Visit Note  Patient: Tina [name]             Date of Birth: 01-02-1961           MRN: [id number]             PCP: Martinique, Betty G, MD Referring: Martinique, Betty G, MD Visit Date: 02/27/2022 Occupation: '@GUAROCC'$ @  Subjective:  Pain in right thumb  History of Present Illness: Lenox Ladouceur is a 61 y.o. female history of autoimmune disease, osteoarthritis and fibromyalgia syndrome.  She states she has generalized muscle pain.  Overall she is doing better.  She continues to have some neck stiffness and lower back pain.  She has been experiencing pain and discomfort in her right thumb which she describes over the right first MCP joint over the last few weeks.  She states she has surgery on her right thumb in the past.  None of the other joints are swollen or painful.  She has dry mouth and dry eye symptoms which are manageable with over-the-counter products.  She denies any history of inflammatory arthritis, oral ulcers, nasal ulcers, malar rash, photosensitivity, Raynaud's phenomenon or lymphadenopathy.  She has been taking hydroxychloroquine 1 tablet p.o. daily without any side effects.  Activities of Daily Living:  Patient reports morning stiffness for 5 minutes.   Patient Denies nocturnal pain.  Difficulty dressing/grooming: Denies Difficulty climbing stairs: Denies Difficulty getting out of chair: Denies Difficulty using hands for taps, buttons, cutlery, and/or writing: Denies  Review of Systems  Constitutional:  Negative for fatigue.  HENT:  Negative for mouth sores and mouth dryness.   Eyes:  Negative for dryness.  Respiratory:  Negative for shortness of breath and difficulty breathing.   Cardiovascular:  Negative for chest pain and palpitations.  Gastrointestinal:  Negative for abdominal pain, blood in stool, constipation and diarrhea.  Endocrine: Negative for increased urination.  Genitourinary:  Negative for difficulty urinating.  Musculoskeletal:  Positive for joint  pain and joint pain. Negative for gait problem, joint swelling, myalgias, morning stiffness and myalgias.  Skin:  Negative for color change, rash and sensitivity to sunlight.  Allergic/Immunologic: Negative for susceptible to infections.  Neurological:  Negative for dizziness and headaches.  Hematological:  Negative for swollen glands.  Psychiatric/Behavioral:  Negative for depressed mood and sleep disturbance. The patient is not nervous/anxious.     PMFS History:  Patient Active Problem List   Diagnosis Date Noted   Controlled type 2 diabetes mellitus with complication, without long-term current use of insulin ([place]) 05/07/2019   Hyperlipidemia 05/05/2019   Obesity (BMI 30-39.9) 05/05/2019   Fibromyalgia 02/28/2016   Chronic low back pain 01/24/2016   Lupus ([place]) 04/19/2015   GERD (gastroesophageal reflux disease) 04/19/2015   Cervicogenic headache 02/10/2015   Neck pain 02/10/2015   Hot flashes 10/26/2014   GAD (generalized anxiety disorder) 10/26/2014   Peripheral neuropathy due to chemotherapy ([place]) 03/12/2014   Breast cancer of lower-outer quadrant of right female breast ([place]) 09/15/2013   Left shoulder pain - seeing Dr. Lorin Mercy 08/20/2013   Essential (primary) hypertension     Past Medical History:  Diagnosis Date   Anxiety    Breast cancer ([place]) 2015   Right Breast Cancer   Cancer of right breast ([place]) 09/03/13   Invasive Ductal Carcinoma/Ductal Carcinoma Insitu  biopsies   Chronic low back pain 01/24/2016   -and R hip pain -seen by rheumatologist and ortho in the past -MRI with Dr. Lorin Mercy in 2015, R hip plain films in  2015 as well acupuncture, injections, tramadol not helpful trigger therapy [facility] and PT helpful    Degenerative joint disease    back neck and shoulder   Fibromyalgia    Dr. Estanislado Pandy   GERD (gastroesophageal reflux disease)    hx esophagitis   Hot flashes    Hypertension    Leukopenia 06/16/2013   Lupus [facility])    rheumatologist - Dr.  Estanislado Pandy   Migraines    Neuromuscular disorder [facility])    neuropathy from chemo in feet/hands   Personal history of chemotherapy    2015   Personal history of radiation therapy 2015   Right Breast Cancer   S/P radiation therapy 04/27/2014-06/07/2014   1) Right breast, 50 Gy in 25 fractions/ 2) Right breast boost, 10 Gy in 5 fractions    Family History  Problem Relation Age of Onset   Hypertension Mother    Dementia Mother        small vessel disease   Cancer Mother 58       cervical  cancer   Cancer Father 35       prostate ca   Diabetes Father    Arthritis Father    Cancer Half-Sister    Breast cancer Neg Hx    Past Surgical History:  Procedure Laterality Date   BREAST BIOPSY Left 2015   benign core   BREAST BIOPSY Right 2015   malignant core    BREAST LUMPECTOMY Right 2015   BREAST SURGERY  03/01/2014   hx right breast cancer- 3 lymph nodes removed and lumpectomy   CARPAL TUNNEL RELEASE  1998   rt   CESAREAN SECTION     x 3   PORTACATH PLACEMENT  08/2013   still currently active    ROBOTIC ASSISTED BILATERAL SALPINGO OOPHERECTOMY Bilateral 07/09/2014   Procedure: ROBOTIC ASSISTED BILATERAL SALPINGO OOPHORECTOM; :LYSIS OF ADHESIONS;  Surgeon: Marvene Staff, MD;  Location: [facility];  Service: Gynecology;  Laterality: Bilateral;   TRIGGER FINGER RELEASE  2004   rt thumb   trigger finger release left thumb  06/28/14   at orthopedic surgical center   [place] EXTRACTION     Social History   Social History Narrative   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      Home Situation: lives with husband and 79 yo son      Spiritual Beliefs: Christian      Lifestyle: elliptical 3-4 times per week; working on diet as well over last year in 2014            Immunization History  Administered Date(s) Administered   Influenza Split 06/16/2012   Influenza,inj,Quad PF,6+ Mos 06/17/2013, 04/23/2014, 04/25/2015, 05/17/2017, 05/05/2019, 06/30/2021    PFIZER(Purple Top)SARS-COV-2 Vaccination 10/09/2019, 11/02/2019   Pneumococcal Polysaccharide-23 02/16/2015     Objective: Vital Signs: BP (!) 147/95 (BP Location: Left Arm, Patient Position: Sitting, Cuff Size: Normal)   Pulse 78   Resp 16   Ht '5\' 5"'$  (1.651 m)   Wt 136 lb (61.7 kg)   LMP 09/28/2013 (Approximate)   BMI 22.63 kg/m    Physical Exam Vitals and nursing note reviewed.  Constitutional:      Appearance: She is well-developed.  HENT:     Head: Normocephalic and atraumatic.  Eyes:     Conjunctiva/sclera: Conjunctivae normal.  Cardiovascular:     Rate and Rhythm: Normal rate and regular rhythm.     Heart sounds: Normal heart sounds.  Pulmonary:     Effort: Pulmonary effort is normal.     Breath sounds: Normal breath sounds.  Abdominal:     General: Bowel sounds are normal.     Palpations: Abdomen is soft.  Musculoskeletal:     Cervical back: Normal range of motion.  Lymphadenopathy:     Cervical: No cervical adenopathy.  Skin:    General: Skin is warm and dry.     Capillary Refill: Capillary refill takes less than 2 seconds.  Neurological:     Mental Status: She is alert and oriented to person, place, and time.  Psychiatric:        Behavior: Behavior normal.      Musculoskeletal Exam: Cervical spine was in good range of motion.  There was no tenderness over thoracic or lumbar spine.  She has some discomfort over the SI joint region.  Shoulder joints, elbow joints, wrist joints, MCPs PIPs and DIPs with good range of motion.  She had tenderness over right first MCP joint with no synovitis.  Hip joints, knee joints, ankles, MTPs and PIPs with good range of motion with no synovitis.  CDAI Exam: CDAI Score: -- Patient Global: --; Provider Global: -- Swollen: 0 ; Tender: 0  Joint Exam 02/27/2022   No joint exam has been documented for this visit   There is currently no information documented on the homunculus. Go to the Rheumatology activity and complete the  homunculus joint exam.  Investigation: No additional findings.  Imaging: No results found.  Recent Labs: Lab Results  Component Value Date   WBC 3.5 (L) 09/13/2021   HGB 13.5 09/13/2021   PLT 216 09/13/2021   NA 138 09/13/2021   K 3.3 (L) 09/13/2021   CL 102 09/13/2021   CO2 27 09/13/2021   GLUCOSE 90 02/26/2022   BUN 16 09/13/2021   CREATININE 1.08 (H) 09/13/2021   BILITOT 0.9 09/13/2021   ALKPHOS 50 11/22/2020   AST 24 09/13/2021   ALT 22 09/13/2021   PROT 8.2 (H) 09/13/2021   ALBUMIN 4.4 11/22/2020   CALCIUM 9.8 09/13/2021   GFRAA 64 11/03/2020    Speciality Comments: PLQ eye exam: 09/18/2021 [facility]. Follow up in 6 months.  Procedures:  No procedures performed Allergies: Adhesive [tape], Other, and Nickel   Assessment / Plan:     Visit Diagnoses: Autoimmune disease ([place]) - + ANA 1:320 Speckled-patient had past Hx of neutropenia, fatigue, oral ulcers, sicca symptoms, parotitis, positive anti-CCP:  -She gives history of sicca symptoms and joint pain.  She denies any history of oral ulcers, nasal ulcers, malar rash, photosensitivity, Raynaud's phenomenon or lymphadenopathy.  She complains of discomfort in her right first MCP joint.  No synovitis was noted.  She has been taking Plaquenil 200 mg p.o. daily without any side effects.  Plan: Protein / creatinine ratio, urine, Anti-DNA antibody, double-stranded, C3 and C4, Sedimentation rate  High risk medication use - Plaquenil 200 mg 1 tablet by mouth daily. PLQ eye exam: 09/18/2021  -Labs obtained on September 13, 2021 were reviewed.  Neutropenia was noted.  Creatinine was mildly elevated.  Double-stranded DNA was elevated at 11.  We will check autoimmune labs today.  Plan: CBC with Differential/Platelet, COMPLETE METABOLIC PANEL WITH GFR.  Information reembolization was placed in the AVS.  Cyclic citrullinated peptide (CCP) antibody positive - She has no clinical features of rheumatoid arthritis.  She had no  synovitis on examination.  Primary osteoarthritis of both hands-she complains of discomfort in her hands.  No synovitis was noted.  Primary osteoarthritis of both hips-she has intermittent discomfort.  Primary osteoarthritis of both feet-peppering shoes were advised.  DDD (degenerative disc disease), cervical-she continues to have neck pain and stiffness.  Trapezius muscle spasm - Resolved  Chronic right SI joint pain -history of intermittent pain.  Fibromyalgia-she continues to have generalized pain and discomfort.  She had positive tender points.  She gives history of myalgias.  I will refer her to physical therapy.  Osteopenia, unspecified location - DXA done by her GYN on 02/08/2020 showed a T score of -1.6, BMD 0.979 in the lumbar region.  Due to update DEXA in July 2023.  Use of calcium rich diet and vitamin D was advised.  Peripheral neuropathy due to chemotherapy ([place]) - Followed by Dr.Aquino  Essential hypertension  Malignant neoplasm of lower-outer quadrant of right breast of female, estrogen receptor positive ([place]) - 2015, lumpectomy, oopherectomy, CTX, RTX  Orders: Orders Placed This Encounter  Procedures   Protein / creatinine ratio, urine   CBC with Differential/Platelet   COMPLETE METABOLIC PANEL WITH GFR   Anti-DNA antibody, double-stranded   C3 and C4   Sedimentation rate   Ambulatory referral to Physical Therapy   No orders of the defined types were placed in this encounter.    Follow-Up Instructions: Return in about 5 months (around 07/30/2022) for Autoimmune disease.   [name], MD  Note - This record has been created using Editor, commissioning.  Chart creation errors have been sought, but may not always  have been located. Such creation errors do not reflect on  the standard of medical care.

## 2022-02-19 ENCOUNTER — Ambulatory Visit: Payer: BC Managed Care – PPO | Admitting: Family Medicine

## 2022-02-23 NOTE — Progress Notes (Unsigned)
HPI: [name] is a 61 y.o. female, who is here today for her routine physical.  Last CPE: 12/28/20  She is exercising regularly: Walks 4 miles daily and pool exercises 4 times per week. She tries to follow a healthful diet, she does not cook.  Chronic medical problems: Lupus,HLD,HTN,DM II,fibromyalgia,GAD,GERD,peripheral neuropathy,and hx of right breast cancer s/p chemo with residual neuropathy among some.  She follows with rheumatology, she has an appointment today.  Immunization History  Administered Date(s) Administered   Influenza Split 06/16/2012   Influenza,inj,Quad PF,6+ Mos 06/17/2013, 04/23/2014, 04/25/2015, 05/17/2017, 05/05/2019, 06/30/2021   PFIZER(Purple Top)SARS-COV-2 Vaccination 10/09/2019, 11/02/2019   Pneumococcal Polysaccharide-23 02/16/2015   Health Maintenance  Topic Date Due   FOOT EXAM  12/28/2021   INFLUENZA VACCINE  02/27/2022   COVID-19 Vaccine (3 - Pfizer risk series) 03/14/2022 (Originally 11/30/2019)   Zoster Vaccines- Shingrix (1 of 2) 05/30/2022 (Originally 09/26/1979)   TETANUS/TDAP  02/28/2023 (Originally 12/28/2021)   HEMOGLOBIN A1C  08/29/2022   OPHTHALMOLOGY EXAM  09/18/2022   MAMMOGRAM  02/23/2023   PAP SMEAR-Modifier  09/29/2023   COLONOSCOPY (Pts 45-26yr Insurance coverage will need to be confirmed)  01/01/2026   Hepatitis C Screening  Completed   HIV Screening  Completed   HPV VACCINES  Aged Out   She is established with gyn, Dr CMaudry [name] Diabetes Mellitus II: Dx'ed in 11/2018 with HgA1C 6.5 x 2.  She is on non pharmacologic treatment. Negative for polydipsia,polyuria, or polyphagia.  Lab Results  Component Value Date   HGBA1C 5.7 (A) 06/30/2021   Lab Results  Component Value Date   MICROALBUR 0.3 06/30/2021   MICROALBUR 4.1 (H) 12/20/2020   Hyperlipidemia: She is not on pharmacologic treatment, which has been recommended in the past.  Lab Results  Component Value Date   CHOL 262 (H) 12/20/2020   HDL 76.20 12/20/2020    LDLCALC 153 (H) 12/20/2020   LDLDIRECT 123.0 10/26/2014   TRIG 165.0 (H) 12/20/2020   CHOLHDL 3 12/20/2020   HTN: She has had some low BP's at home, so 2 weeks ago she decreased dose of lisinopril-HCTZ 20-12.5 mg to 1/2 tablet  Lab Results  Component Value Date   CREATININE 1.08 (H) 09/13/2021   BUN 16 09/13/2021   NA 138 09/13/2021   K 3.3 (L) 09/13/2021   CL 102 09/13/2021   CO2 27 09/13/2021   She would like "hormones" check. She feels like it she would have had this done before, maybe breast cancer could have been prevented. She needs form completed with biometric labs. She has lab appt with her rheumatologist today.  Review of Systems  Constitutional:  Negative for activity change, appetite change and fever.  HENT:  Negative for hearing loss, mouth sores, sore throat and trouble swallowing.   Eyes:  Negative for redness and visual disturbance.  Respiratory:  Negative for cough, shortness of breath and wheezing.   Cardiovascular:  Negative for chest pain and leg swelling.  Gastrointestinal:  Negative for abdominal pain, nausea and vomiting.       No changes in bowel habits.  Endocrine: Negative for cold intolerance and heat intolerance.  Genitourinary:  Negative for decreased urine volume, dysuria, hematuria, vaginal bleeding and vaginal discharge.  Musculoskeletal:  Negative for gait problem and joint swelling.  Skin:  Negative for color change and rash.  Allergic/Immunologic: Positive for environmental allergies.  Neurological:  Negative for syncope, weakness and headaches.  Hematological:  Negative for adenopathy. Does not bruise/bleed easily.  Psychiatric/Behavioral:  Negative for  behavioral problems and confusion.   All other systems reviewed and are negative.  Current Outpatient Medications on File Prior to Visit  Medication Sig Dispense Refill   Accu-Chek Softclix Lancets lancets Use to check blood sugars 1-2 times daily. 100 each 12   albuterol (VENTOLIN HFA)  108 (90 Base) MCG/ACT inhaler Inhale 2 puffs into the lungs every 6 (six) hours as needed for wheezing or shortness of breath. 8 g 0   aspirin 81 MG tablet Take 81 mg by mouth daily.     b complex vitamins capsule Take 1 capsule by mouth daily.     BIOTIN 5000 PO Take 1 capsule by mouth. Takes sometimes     Blood Glucose Monitoring Suppl (ACCU-CHEK GUIDE) w/Device KIT Use to check blood sugars 1-2 times daily. 1 kit 0   Calcium Carbonate-Vit D-Min (CALTRATE 600+D PLUS MINERALS PO) Take 1 tablet by mouth daily.     Cholecalciferol (VITAMIN D3 PO) Take by mouth.     gabapentin (NEURONTIN) 600 MG tablet Take 1 tablet (600 mg total) by mouth 3 (three) times daily. 270 tablet 3   glucose blood (ACCU-CHEK GUIDE) test strip Use to check blood sugars 1-2 times daily. 100 each 12   HEMP OIL-VANILLYL BUTYL ETHER EX Apply topically as needed.     hydroxychloroquine (PLAQUENIL) 200 MG tablet Take 1 tablet (200 mg total) by mouth daily. 90 tablet 0   loratadine (CLARITIN) 10 MG tablet Take 10 mg by mouth as needed.     MAGNESIUM PO Take by mouth daily.     omeprazole (PRILOSEC) 40 MG capsule TAKE 1 CAPSULE BY MOUTH ONCE DAILY BEFORE BREAKFAST 90 capsule 2   Propylene Glycol (SYSTANE COMPLETE OP) Apply to eye.     SUMAtriptan (IMITREX) 25 MG tablet Take 1 tablet (25 mg total) by mouth every 2 (two) hours as needed for migraine. May repeat in 2 hours if needed. Do not take more then 2 tablets in 24 hours. 10 tablet 11   TURMERIC PO Take by mouth daily.     UNABLE TO FIND Apply topically as needed. THC oil     No current facility-administered medications on file prior to visit.   Past Medical History:  Diagnosis Date   Anxiety    Breast cancer ([place]) 2015   Right Breast Cancer   Cancer of right breast ([place]) 09/03/13   Invasive Ductal Carcinoma/Ductal Carcinoma [facility]  biopsies   Chronic low back pain 01/24/2016   -and R hip pain -seen by rheumatologist and ortho in the past -MRI with Dr. Lorin Mercy in 2015, R  hip plain films in 2015 as well acupuncture, injections, tramadol not helpful trigger therapy at [facility] and PT helpful    Degenerative joint disease    back neck and shoulder   Fibromyalgia    Dr. Estanislado Pandy   GERD (gastroesophageal reflux disease)    hx esophagitis   Hot flashes    Hypertension    Leukopenia 06/16/2013   Lupus [facility])    rheumatologist - Dr. Estanislado Pandy   Migraines    Neuromuscular disorder [facility])    neuropathy from chemo in feet/hands   Personal history of chemotherapy    2015   Personal history of radiation therapy 2015   Right Breast Cancer   S/P radiation therapy 04/27/2014-06/07/2014   1) Right breast, 50 Gy in 25 fractions/ 2) Right breast boost, 10 Gy in 5 fractions    Past Surgical History:  Procedure Laterality Date   BREAST BIOPSY Left  2015   benign core   BREAST BIOPSY Right 2015   malignant core    BREAST LUMPECTOMY Right 2015   BREAST SURGERY  03/01/2014   hx right breast cancer- 3 lymph nodes removed and lumpectomy   CARPAL TUNNEL RELEASE  1998   rt   CESAREAN SECTION     x 3   PORTACATH PLACEMENT  08/2013   still currently active    ROBOTIC ASSISTED BILATERAL SALPINGO OOPHERECTOMY Bilateral 07/09/2014   Procedure: ROBOTIC ASSISTED BILATERAL SALPINGO OOPHORECTOM; :LYSIS OF ADHESIONS;  Surgeon: Marvene Staff, MD;  Location: [facility];  Service: Gynecology;  Laterality: Bilateral;   TRIGGER FINGER RELEASE  2004   rt thumb   trigger finger release left thumb  06/28/14   at orthopedic surgical center   [place] EXTRACTION      Allergies  Allergen Reactions   Adhesive [Tape] Itching    Burned and scarred skinned   Other     Cholroprep-causes rash   Nickel Rash    Family History  Problem Relation Age of Onset   Hypertension Mother    Dementia Mother        small vessel disease   Cancer Mother 20       cervical  cancer   Cancer Father 10       prostate ca   Diabetes Father    Arthritis Father     Cancer Half-Sister    Breast cancer Neg Hx     Social History   Socioeconomic History   Marital status: Married    Spouse name: Not on file   Number of children: 1   Years of education: Not on file   Highest education level: Not on file  Occupational History    Comment: homemaker  Tobacco Use   Smoking status: Former    Packs/day: 1.00    Years: 15.00    Total pack years: 15.00    Types: Cigars, Cigarettes    Quit date: 02/23/2004    Years since quitting: 18.0    Passive exposure: Current (minimal)   Smokeless tobacco: Never   Tobacco comments:    remote smoking history  Vaping Use   Vaping Use: Never used  Substance and Sexual Activity   Alcohol use: Yes    Alcohol/week: 0.0 standard drinks of alcohol    Comment: rare   Drug use: Yes    Types: Marijuana    Comment: Marijuana as needed    Sexual activity: Yes  Other Topics Concern   Not on file  Social History Narrative   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      Home Situation: lives with husband and 53 yo son      Spiritual Beliefs: Christian      Lifestyle: elliptical 3-4 times per week; working on diet as well over last year in 2014            Social Determinants of Health   Financial Resource Strain: Low Risk  (11/08/2021)   Overall Financial Resource Strain (CARDIA)    Difficulty of Paying Living Expenses: Not hard at all  Food Insecurity: No Food Insecurity (11/08/2021)   Hunger Vital Sign    Worried About Running Out of Food in the Last Year: Never true    [place] in the Last Year: Never true  Transportation Needs: No Transportation Needs (11/08/2021)   PRAPARE - Hydrologist (Medical): No  Lack of Transportation (Non-Medical): No  Physical Activity: Sufficiently Active (11/08/2021)   Exercise Vital Sign    Days of Exercise per Week: 5 days    Minutes of Exercise per Session: 60 min  Stress: No Stress Concern Present (11/08/2021)   [place]    Feeling of Stress : Not at all  Social Connections: Moderately Isolated (11/08/2021)   Social Connection and Isolation Panel [NHANES]    Frequency of Communication with Friends and Family: More than three times a week    Frequency of Social Gatherings with Friends and Family: More than three times a week    Attends Religious Services: Never    Marine scientist or Organizations: No    Attends Archivist Meetings: Never    Marital Status: Married    Vitals:   02/26/22 0951  BP: 128/80  Pulse: 82  Resp: 12  SpO2: 99%  Body mass index is 29.03 kg/m. Wt Readings from Last 3 Encounters: 02/26/22 169 lb 2 oz (76.7 kg)    11/08/21 174 lb (78.9 kg)  10/20/21 174 lb (78.9 kg)  09/13/21 179 lb (81.2 kg)   Physical Exam Vitals and nursing note reviewed.  Constitutional:      General: She is not in acute distress.    Appearance: She is well-developed.  HENT:     Head: Normocephalic and atraumatic.     Right Ear: Hearing, tympanic membrane, ear canal and external ear normal.     Left Ear: Hearing, tympanic membrane, ear canal and external ear normal.     Mouth/Throat:     Mouth: Mucous membranes are moist.     Dentition: Has dentures.     Pharynx: Oropharynx is clear. Uvula midline.  Eyes:     Extraocular Movements: Extraocular movements intact.     Conjunctiva/sclera: Conjunctivae normal.     Pupils: Pupils are equal, round, and reactive to light.  Neck:     Thyroid: No thyromegaly.     Trachea: No tracheal deviation.  Cardiovascular:     Rate and Rhythm: Normal rate and regular rhythm.     Pulses:          Dorsalis pedis pulses are 2+ on the right side and 2+ on the left side.     Heart sounds: No murmur heard. Pulmonary:     Effort: Pulmonary effort is normal. No respiratory distress.     Breath sounds: Normal breath sounds.  Abdominal:     Palpations: Abdomen is soft. There is no hepatomegaly or mass.      Tenderness: There is no abdominal tenderness.  Genitourinary:    Comments: Deferred to gyn. Musculoskeletal:     Comments: No signs of synovitis appreciated.  Lymphadenopathy:     Cervical: No cervical adenopathy.     Upper Body:     Right upper body: No supraclavicular adenopathy.     Left upper body: No supraclavicular adenopathy.  Skin:    General: Skin is warm.     Findings: No erythema or rash.  Neurological:     General: No focal deficit present.     Mental Status: She is alert and oriented to person, place, and time.     Cranial Nerves: No cranial nerve deficit.     Coordination: Coordination normal.     Gait: Gait normal.     Deep Tendon Reflexes:     Reflex Scores:      Bicep reflexes are  2+ on the right side and 2+ on the left side.      Patellar reflexes are 2+ on the right side and 2+ on the left side. Psychiatric:     Comments: Well groomed, good eye contact.   ASSESSMENT AND PLAN:  [name] was here today annual physical examination.  Orders Placed This Encounter  Procedures   TSH   Glucose, random   Lipid panel   Hemoglobin A1c   AMB Referral to [place]   Lab Results  Component Value Date   CHOL 278 (H) 02/26/2022   HDL 73.70 02/26/2022   LDLCALC 176 (H) 02/26/2022   LDLDIRECT 123.0 10/26/2014   TRIG 142.0 02/26/2022   CHOLHDL 4 02/26/2022   Lab Results  Component Value Date   HGBA1C 5.8 02/26/2022   Lab Results  Component Value Date   TSH 1.11 02/26/2022   Routine general medical examination at a health care facility We discussed the importance of regular physical activity and healthy diet for prevention of chronic illness and/or complications. Preventive guidelines reviewed. Vaccination: Declined Tdap and shingrix. Continue her female care with gyn. Ca++ and vit D supplementation to continue. Next CPE in a year.  Controlled type 2 diabetes mellitus with complication, without long-term current use of insulin  ([place]) HgA1C has been at goal. Continue non pharmacologic treatment. Annual eye exam, periodic dental and foot care recommended. F/U in 5-6 months.  Hyperlipidemia, unspecified hyperlipidemia type We discussed guideline in regard to pharmacologics treatment and CV benefits of statin. She is not interested in pharmacologic treatment for now.  Essential (primary) hypertension BP adequately controlled today. She has had a few episodes of hypotension, 103/70 and 95/71. She has decreased  Lisinopril-HCTZ to 1/2 tab for 2 weeks now, instructed to continue. We discussed possible complications of elevated BP. Continue monitoring BP regularly.  -     lisinopril-hydrochlorothiazide (ZESTORETIC) 20-12.5 MG tablet; Take 0.5 tablets by mouth daily. -     TSH  I could order hormonal testing as requested, explained that most likely will not be covered by her health insurance,she prefers to hold on it. I ordered TSH under HTN. We had a long discussion about hormones check, explained that there is no evidence that this would have helped to prevent breast cancer or future illness. She could also have this discussion with her gynecologist.  Return in 6 months (on 08/29/2022).  Olar Santini G. [place], MD  [facility]. [place] office.

## 2022-02-26 ENCOUNTER — Ambulatory Visit (INDEPENDENT_AMBULATORY_CARE_PROVIDER_SITE_OTHER): Payer: BC Managed Care – PPO | Admitting: Family Medicine

## 2022-02-26 ENCOUNTER — Encounter: Payer: Self-pay | Admitting: Family Medicine

## 2022-02-26 VITALS — BP 128/80 | HR 82 | Resp 12 | Ht 64.0 in | Wt 169.1 lb

## 2022-02-26 DIAGNOSIS — Z0001 Encounter for general adult medical examination with abnormal findings: Secondary | ICD-10-CM

## 2022-02-26 DIAGNOSIS — E118 Type 2 diabetes mellitus with unspecified complications: Secondary | ICD-10-CM | POA: Diagnosis not present

## 2022-02-26 DIAGNOSIS — E785 Hyperlipidemia, unspecified: Secondary | ICD-10-CM

## 2022-02-26 DIAGNOSIS — I10 Essential (primary) hypertension: Secondary | ICD-10-CM

## 2022-02-26 DIAGNOSIS — Z0000 Encounter for general adult medical examination without abnormal findings: Secondary | ICD-10-CM

## 2022-02-26 LAB — HEMOGLOBIN A1C: Hgb A1c MFr Bld: 5.8 % (ref 4.6–6.5)

## 2022-02-26 LAB — LIPID PANEL
Cholesterol: 278 mg/dL — ABNORMAL HIGH (ref 0–200)
HDL: 73.7 mg/dL
LDL Cholesterol: 176 mg/dL — ABNORMAL HIGH (ref 0–99)
NonHDL: 204.33
Total CHOL/HDL Ratio: 4
Triglycerides: 142 mg/dL (ref 0.0–149.0)
VLDL: 28.4 mg/dL (ref 0.0–40.0)

## 2022-02-26 LAB — GLUCOSE, RANDOM: Glucose, Bld: 90 mg/dL (ref 70–99)

## 2022-02-26 LAB — TSH: TSH: 1.11 u[IU]/mL (ref 0.35–5.50)

## 2022-02-26 MED ORDER — LISINOPRIL-HYDROCHLOROTHIAZIDE 20-12.5 MG PO TABS: 0.5000 | ORAL_TABLET | Freq: Every day | ORAL | 2 refills | Status: AC

## 2022-02-26 NOTE — Patient Instructions (Addendum)
A few things to remember from today's visit:  Routine general medical examination at a health care facility  Essential hypertension - Plan: TSH, AMB Referral to [place]  Controlled type 2 diabetes mellitus with complication, without long-term current use of insulin ([place]) - Plan: Glucose, random, Hemoglobin A1c, [facility]B [facility]  Hyperlipidemia, unspecified hyperlipidemia type - Plan: Lipid panel, AMB Referral to [place]  If you need refills please call your pharmacy. Do not use My Chart to request refills or for acute issues that need immediate attention.   Please be sure medication list is accurate. If a new problem present, please set up appointment sooner than planned today.  Health Maintenance, Female Adopting a healthy lifestyle and getting preventive care are important in promoting health and wellness. Ask your health care provider about: The right schedule for you to have regular tests and exams. Things you can do on your own to prevent diseases and keep yourself healthy. What should I know about diet, weight, and exercise? Eat a healthy diet  Eat a diet that includes plenty of vegetables, fruits, low-fat dairy products, and lean protein. Do not eat a lot of foods that are high in solid fats, added sugars, or sodium. Maintain a healthy weight Body mass index (BMI) is used to identify weight problems. It estimates body fat based on height and weight. Your health care provider can help determine your BMI and help you achieve or maintain a healthy weight. Get regular exercise Get regular exercise. This is one of the most important things you can do for your health. Most adults should: Exercise for at least 150 minutes each week. The exercise should increase your heart rate and make you sweat (moderate-intensity exercise). Do strengthening exercises at least twice a week. This is in addition to the moderate-intensity  exercise. Spend less time sitting. Even light physical activity can be beneficial. Watch cholesterol and blood lipids Have your blood tested for lipids and cholesterol at 59 years of age, then have this test every 5 years. Have your cholesterol levels checked more often if: Your lipid or cholesterol levels are high. You are older than 59 years of age. You are at high risk for heart disease. What should I know about cancer screening? Depending on your health history and family history, you may need to have cancer screening at various ages. This may include screening for: Breast cancer. Cervical cancer. Colorectal cancer. Skin cancer. Lung cancer. What should I know about heart disease, diabetes, and high blood pressure? Blood pressure and heart disease High blood pressure causes heart disease and increases the risk of stroke. This is more likely to develop in people who have high blood pressure readings or are overweight. Have your blood pressure checked: Every 3-5 years if you are 76-71 years of age. Every year if you are 11 years old or older. Diabetes Have regular diabetes screenings. This checks your fasting blood sugar level. Have the screening done: Once every three years after age 32 if you are at a normal weight and have a low risk for diabetes. More often and at a younger age if you are overweight or have a high risk for diabetes. What should I know about preventing infection? Hepatitis B If you have a higher risk for hepatitis B, you should be screened for this virus. Talk with your health care provider to find out if you are at risk for hepatitis B infection. Hepatitis C Testing is recommended for: Everyone  born from [birth year] through 1963. Anyone with known risk factors for hepatitis C. Sexually transmitted infections (STIs) Get screened for STIs, including gonorrhea and chlamydia, if: You are sexually active and are younger than 59 years of age. You are older than 59 years  of age and your health care provider tells you that you are at risk for this type of infection. Your sexual activity has changed since you were last screened, and you are at increased risk for chlamydia or gonorrhea. Ask your health care provider if you are at risk. Ask your health care provider about whether you are at high risk for HIV. Your health care provider may recommend a prescription medicine to help prevent HIV infection. If you choose to take medicine to prevent HIV, you should first get tested for HIV. You should then be tested every 3 months for as long as you are taking the medicine. Pregnancy If you are about to stop having your period (premenopausal) and you may become pregnant, seek counseling before you get pregnant. Take 400 to 800 micrograms (mcg) of folic acid every day if you become pregnant. Ask for birth control (contraception) if you want to prevent pregnancy. Osteoporosis and menopause Osteoporosis is a disease in which the bones lose minerals and strength with aging. This can result in bone fractures. If you are 13 years old or older, or if you are at risk for osteoporosis and fractures, ask your health care provider if you should: Be screened for bone loss. Take a calcium or vitamin D supplement to lower your risk of fractures. Be given hormone replacement therapy (HRT) to treat symptoms of menopause. Follow these instructions at home: Alcohol use Do not drink alcohol if: Your health care provider tells you not to drink. You are pregnant, may be pregnant, or are planning to become pregnant. If you drink alcohol: Limit how much you have to: 0-1 drink a day. Know how much alcohol is in your drink. In the [place] one drink equals one 12 oz bottle of beer (355 mL), one 5 oz glass of wine (148 mL), or one 1 oz glass of hard liquor (44 mL). Lifestyle Do not use any products that contain nicotine or tobacco. These products include cigarettes, chewing tobacco, and vaping  devices, such as e-cigarettes. If you need help quitting, ask your health care provider. Do not use street drugs. Do not share needles. Ask your health care provider for help if you need support or information about quitting drugs. General instructions Schedule regular health, dental, and eye exams. Stay current with your vaccines. Tell your health care provider if: You often feel depressed. You have ever been abused or do not feel safe at home. Summary Adopting a healthy lifestyle and getting preventive care are important in promoting health and wellness. Follow your health care provider's instructions about healthy diet, exercising, and getting tested or screened for diseases. Follow your health care provider's instructions on monitoring your cholesterol and blood pressure. This information is not intended to replace advice given to you by your health care provider. Make sure you discuss any questions you have with your health care provider. Document Revised: 12/05/2020 Document Reviewed: 12/05/2020 Elsevier Patient Education  [place].

## 2022-02-27 ENCOUNTER — Encounter: Payer: Self-pay | Admitting: Rheumatology

## 2022-02-27 ENCOUNTER — Ambulatory Visit: Payer: BC Managed Care – PPO | Attending: Rheumatology | Admitting: Rheumatology

## 2022-02-27 VITALS — BP 147/95 | HR 78 | Resp 16 | Ht 65.0 in | Wt 136.0 lb

## 2022-02-27 DIAGNOSIS — M62838 Other muscle spasm: Secondary | ICD-10-CM

## 2022-02-27 DIAGNOSIS — T451X5A Adverse effect of antineoplastic and immunosuppressive drugs, initial encounter: Secondary | ICD-10-CM

## 2022-02-27 DIAGNOSIS — M533 Sacrococcygeal disorders, not elsewhere classified: Secondary | ICD-10-CM

## 2022-02-27 DIAGNOSIS — M19041 Primary osteoarthritis, right hand: Secondary | ICD-10-CM | POA: Diagnosis not present

## 2022-02-27 DIAGNOSIS — Z79899 Other long term (current) drug therapy: Secondary | ICD-10-CM | POA: Diagnosis not present

## 2022-02-27 DIAGNOSIS — G8929 Other chronic pain: Secondary | ICD-10-CM

## 2022-02-27 DIAGNOSIS — M359 Systemic involvement of connective tissue, unspecified: Secondary | ICD-10-CM | POA: Diagnosis not present

## 2022-02-27 DIAGNOSIS — C50511 Malignant neoplasm of lower-outer quadrant of right female breast: Secondary | ICD-10-CM

## 2022-02-27 DIAGNOSIS — M8580 Other specified disorders of bone density and structure, unspecified site: Secondary | ICD-10-CM

## 2022-02-27 DIAGNOSIS — M5030 Other cervical disc degeneration, unspecified cervical region: Secondary | ICD-10-CM | POA: Diagnosis not present

## 2022-02-27 DIAGNOSIS — M797 Fibromyalgia: Secondary | ICD-10-CM

## 2022-02-27 DIAGNOSIS — R768 Other specified abnormal immunological findings in serum: Secondary | ICD-10-CM

## 2022-02-27 DIAGNOSIS — G620 Drug-induced polyneuropathy: Secondary | ICD-10-CM

## 2022-02-27 DIAGNOSIS — M160 Bilateral primary osteoarthritis of hip: Secondary | ICD-10-CM | POA: Diagnosis not present

## 2022-02-27 DIAGNOSIS — I10 Essential (primary) hypertension: Secondary | ICD-10-CM

## 2022-02-27 DIAGNOSIS — M19071 Primary osteoarthritis, right ankle and foot: Secondary | ICD-10-CM

## 2022-02-27 DIAGNOSIS — Z170 Estrogen receptor positive status [ER+]: Secondary | ICD-10-CM

## 2022-02-27 DIAGNOSIS — M19072 Primary osteoarthritis, left ankle and foot: Secondary | ICD-10-CM

## 2022-02-27 DIAGNOSIS — G4486 Cervicogenic headache: Secondary | ICD-10-CM

## 2022-02-27 DIAGNOSIS — M19042 Primary osteoarthritis, left hand: Secondary | ICD-10-CM

## 2022-02-27 NOTE — Patient Instructions (Signed)
Vaccines You are taking a medication(s) that can suppress your immune system.  The following immunizations are recommended: Flu annually Covid-19  Td/Tdap (tetanus, diphtheria, pertussis) every 10 years Pneumonia (Prevnar 15 then Pneumovax 23 at least 1 year apart.  Alternatively, can take Prevnar 20 without needing additional dose) Shingrix: 2 doses from 4 weeks to 6 months apart  Please check with your PCP to make sure you are up to date.  

## 2022-02-28 LAB — CBC WITH DIFFERENTIAL/PLATELET
Absolute Monocytes: 252 cells/uL (ref 200–950)
Basophils Absolute: 32 cells/uL (ref 0–200)
Basophils Relative: 0.9 %
Eosinophils Absolute: 32 cells/uL (ref 15–500)
Eosinophils Relative: 0.9 %
HCT: 39.6 % (ref 35.0–45.0)
Hemoglobin: 13.4 g/dL (ref 11.7–15.5)
Lymphs Abs: 1645 cells/uL (ref 850–3900)
MCH: 31.1 pg (ref 27.0–33.0)
MCHC: 33.8 g/dL (ref 32.0–36.0)
MCV: 91.9 fL (ref 80.0–100.0)
MPV: 10 fL (ref 7.5–12.5)
Monocytes Relative: 7.2 %
Neutro Abs: 1540 cells/uL (ref 1500–7800)
Neutrophils Relative %: 44 %
Platelets: 198 10*3/uL (ref 140–400)
RBC: 4.31 10*6/uL (ref 3.80–5.10)
RDW: 12.6 % (ref 11.0–15.0)
Total Lymphocyte: 47 %
WBC: 3.5 10*3/uL — ABNORMAL LOW (ref 3.8–10.8)

## 2022-02-28 LAB — PROTEIN / CREATININE RATIO, URINE
Creatinine, Urine: 142 mg/dL (ref 20–275)
Protein/Creat Ratio: 56 mg/g creat (ref 24–184)
Protein/Creatinine Ratio: 0.056 mg/mg creat (ref 0.024–0.184)
Total Protein, Urine: 8 mg/dL (ref 5–24)

## 2022-02-28 LAB — SEDIMENTATION RATE: Sed Rate: 11 mm/h (ref 0–30)

## 2022-02-28 LAB — COMPLETE METABOLIC PANEL WITH GFR
AG Ratio: 1.3 (calc) (ref 1.0–2.5)
ALT: 16 U/L (ref 6–29)
AST: 24 U/L (ref 10–35)
Albumin: 4.3 g/dL (ref 3.6–5.1)
Alkaline phosphatase (APISO): 71 U/L (ref 37–153)
BUN/Creatinine Ratio: 10 (calc) (ref 6–22)
BUN: 11 mg/dL (ref 7–25)
CO2: 30 mmol/L (ref 20–32)
Calcium: 10.1 mg/dL (ref 8.6–10.4)
Chloride: 105 mmol/L (ref 98–110)
Creat: 1.12 mg/dL — ABNORMAL HIGH (ref 0.50–1.03)
Globulin: 3.2 g/dL (calc) (ref 1.9–3.7)
Glucose, Bld: 64 mg/dL — ABNORMAL LOW (ref 65–99)
Potassium: 3.5 mmol/L (ref 3.5–5.3)
Sodium: 143 mmol/L (ref 135–146)
Total Bilirubin: 0.9 mg/dL (ref 0.2–1.2)
Total Protein: 7.5 g/dL (ref 6.1–8.1)
eGFR: 57 mL/min/{1.73_m2} — ABNORMAL LOW (ref >=60)

## 2022-02-28 LAB — ANTI-DNA ANTIBODY, DOUBLE-STRANDED: ds DNA Ab: 5 IU/mL — ABNORMAL HIGH

## 2022-02-28 LAB — C3 AND C4
C3 Complement: 137 mg/dL (ref 83–193)
C4 Complement: 20 mg/dL (ref 15–57)

## 2022-02-28 NOTE — Progress Notes (Signed)
White cell count is low and stable, creatinine is elevated and stable, double-stranded DNA is low titer complements normal, sed rate normal, urine protein negative.  Labs are stable.  No change in treatment advised.

## 2022-03-05 ENCOUNTER — Other Ambulatory Visit: Payer: Self-pay

## 2022-03-05 MED ORDER — ROSUVASTATIN CALCIUM 20 MG PO TABS: 20.0000 mg | ORAL_TABLET | Freq: Every day | ORAL | 3 refills | Status: AC

## 2022-03-08 DIAGNOSIS — M5459 Other low back pain: Secondary | ICD-10-CM | POA: Diagnosis not present

## 2022-03-08 DIAGNOSIS — M542 Cervicalgia: Secondary | ICD-10-CM | POA: Diagnosis not present

## 2022-03-08 DIAGNOSIS — R293 Abnormal posture: Secondary | ICD-10-CM | POA: Diagnosis not present

## 2022-03-08 DIAGNOSIS — R202 Paresthesia of skin: Secondary | ICD-10-CM | POA: Diagnosis not present

## 2022-03-12 DIAGNOSIS — M5459 Other low back pain: Secondary | ICD-10-CM | POA: Diagnosis not present

## 2022-03-12 DIAGNOSIS — R293 Abnormal posture: Secondary | ICD-10-CM | POA: Diagnosis not present

## 2022-03-12 DIAGNOSIS — R202 Paresthesia of skin: Secondary | ICD-10-CM | POA: Diagnosis not present

## 2022-03-12 DIAGNOSIS — M542 Cervicalgia: Secondary | ICD-10-CM | POA: Diagnosis not present

## 2022-03-19 DIAGNOSIS — M542 Cervicalgia: Secondary | ICD-10-CM | POA: Diagnosis not present

## 2022-03-19 DIAGNOSIS — R293 Abnormal posture: Secondary | ICD-10-CM | POA: Diagnosis not present

## 2022-03-19 DIAGNOSIS — R202 Paresthesia of skin: Secondary | ICD-10-CM | POA: Diagnosis not present

## 2022-03-19 DIAGNOSIS — M5459 Other low back pain: Secondary | ICD-10-CM | POA: Diagnosis not present

## 2022-03-21 ENCOUNTER — Telehealth: Payer: Self-pay | Admitting: *Deleted

## 2022-03-21 ENCOUNTER — Ambulatory Visit (INDEPENDENT_AMBULATORY_CARE_PROVIDER_SITE_OTHER): Payer: Medicare Other | Admitting: Family Medicine

## 2022-03-21 ENCOUNTER — Encounter: Payer: Self-pay | Admitting: Family Medicine

## 2022-03-21 VITALS — BP 140/96 | Temp 98.4°F

## 2022-03-21 DIAGNOSIS — K047 Periapical abscess without sinus: Secondary | ICD-10-CM

## 2022-03-21 MED ORDER — HYDROCODONE-ACETAMINOPHEN 5-325 MG PO TABS: 1.0000 | ORAL_TABLET | Freq: Four times a day (QID) | ORAL | 0 refills | Status: AC | PRN

## 2022-03-21 MED ORDER — AMOXICILLIN 500 MG PO CAPS: 500.0000 mg | ORAL_CAPSULE | Freq: Three times a day (TID) | ORAL | 0 refills | Status: AC

## 2022-03-21 NOTE — Progress Notes (Signed)
Established Patient Office Visit  Subjective   Patient ID: Tina [name], female    DOB: 02-14-1961  Age: 61 y.o. MRN: [id number]  Chief Complaint  Patient presents with   Dental Pain    Patient complains of tooth pain, x1 day    HPI   Seen with right lower gum pain onset yesterday.  She has longstanding history of dental caries.  Has some visible facial swelling extending down to the lower mandible region.  She has had contact with dentist and is waiting to get something more definitive done.  She has several teeth that are broken off that need to be pulled.  Has had significant pain especially at night.  Requesting something for pain.  No relief with plain over-the-counter medications  Past Medical History:  Diagnosis Date   Anxiety    Breast cancer ([place]) 2015   Right Breast Cancer   Cancer of right breast ([place]) 09/03/13   Invasive Ductal Carcinoma/Ductal Carcinoma Insitu  biopsies   Chronic low back pain 01/24/2016   -and R hip pain -seen by rheumatologist and ortho in the past -MRI with Dr. Lorin Mercy in 2015, R hip plain films in 2015 as well acupuncture, injections, tramadol not helpful trigger therapy at [facility] and PT helpful    Degenerative joint disease    back neck and shoulder   Fibromyalgia    Dr. Estanislado Pandy   GERD (gastroesophageal reflux disease)    hx esophagitis   Hot flashes    Hypertension    Leukopenia 06/16/2013   Lupus [facility])    rheumatologist - Dr. Estanislado Pandy   Migraines    Neuromuscular disorder [facility])    neuropathy from chemo in feet/hands   Personal history of chemotherapy    2015   Personal history of radiation therapy 2015   Right Breast Cancer   S/P radiation therapy 04/27/2014-06/07/2014   1) Right breast, 50 Gy in 25 fractions/ 2) Right breast boost, 10 Gy in 5 fractions   Past Surgical History:  Procedure Laterality Date   BREAST BIOPSY Left 2015   benign core   BREAST BIOPSY Right 2015   malignant core    BREAST LUMPECTOMY Right  2015   BREAST SURGERY  03/01/2014   hx right breast cancer- 3 lymph nodes removed and lumpectomy   CARPAL TUNNEL RELEASE  1998   rt   CESAREAN SECTION     x 3   PORTACATH PLACEMENT  08/2013   still currently active    ROBOTIC ASSISTED BILATERAL SALPINGO OOPHERECTOMY Bilateral 07/09/2014   Procedure: ROBOTIC ASSISTED BILATERAL SALPINGO OOPHORECTOM; :LYSIS OF ADHESIONS;  Surgeon: Marvene Staff, MD;  Location: [facility];  Service: Gynecology;  Laterality: Bilateral;   TRIGGER FINGER RELEASE  2004   rt thumb   trigger finger release left thumb  06/28/14   at orthopedic surgical center   [place]      reports that she quit smoking about 18 years ago. Her smoking use included cigars and cigarettes. She has a 15.00 pack-year smoking history. She has been exposed to tobacco smoke. She has never used smokeless tobacco. She reports current alcohol use. She reports current drug use. Drug: Marijuana. family history includes Arthritis in her father; Cancer in her half-sister; Cancer (age of onset: 109) in her mother; Cancer (age of onset: 76) in her father; Dementia in her mother; Diabetes in her father; Hypertension in her mother. Allergies  Allergen Reactions   Adhesive [Tape] Itching  Burned and scarred skinned   Other     Cholroprep-causes rash   Nickel Rash    Review of Systems  Constitutional:  Negative for chills and fever.      Objective:     BP (!) 140/96 (BP Location: Left Arm, Patient Position: Sitting, Cuff Size: Normal)   Temp 98.4 F (36.9 C) (Oral)   LMP 09/28/2013 (Approximate)    Physical Exam Vitals reviewed.  Constitutional:      Appearance: Normal appearance.  HENT:     Head:     Comments: She has some obvious swelling around the right lower mandible region.    Mouth/Throat:     Comments: Several dental caries.  She has a couple of right lower posterior molars that are very decayed and has some surrounding gum erythema.  No  visible pus. Cardiovascular:     Rate and Rhythm: Normal rate and regular rhythm.     Heart sounds: No murmur heard. Pulmonary:     Effort: Pulmonary effort is normal.     Breath sounds: Normal breath sounds.  Neurological:     Mental Status: She is alert.      No results found for any visits on 03/21/22.    The 10-year ASCVD risk score (Arnett DK, et al., 2019) is: 20.8%    Assessment & Plan:   Problem List Items Addressed This Visit   None Visit Diagnoses     Dental abscess    -  Primary     Dental caries with probably early right lower mandible dental abscess.  Patient waiting to see dentist.  Start amoxicillin 500 mg 3 times daily for 10 days.  Vicodin 5/325 mg 1 every 4-6 hours as needed for pain #20 with no refill.  No follow-ups on file.    Carolann Littler, MD

## 2022-03-21 NOTE — Chronic Care Management (AMB) (Signed)
  Care Coordination   Note   03/21/2022 Name: Charmaine Placido MRN: [id number] DOB: 1961/04/17  Lanise Mergen is a 61 y.o. year old female who sees [place], Malka So, MD for primary care. I reached out to [name] by phone today to offer care coordination services.  Ms. Kugel was given information about Care Coordination services today including:   The Care Coordination services include support from the care team which includes your Nurse Coordinator, Clinical Social Worker, or Pharmacist.  The Care Coordination team is here to help remove barriers to the health concerns and goals most important to you. Care Coordination services are voluntary, and the patient may decline or stop services at any time by request to their care team member.   Care Coordination Consent Status: Patient agreed to services and verbal consent obtained.   Follow up plan:  Telephone appointment with care coordination team member scheduled for:  03/22/22  Encounter Outcome:  Pt. Scheduled  [place]  Direct Dial: [phone]

## 2022-03-22 ENCOUNTER — Ambulatory Visit: Payer: Self-pay | Admitting: *Deleted

## 2022-03-22 NOTE — Patient Outreach (Signed)
  Care Coordination   03/22/2022 Name: Tina [name] MRN: [id number] DOB: 1961/03/14   Care Coordination Outreach Attempts:  An unsuccessful telephone outreach was attempted today to offer the patient information about available care coordination services as a benefit of their health plan.   Follow Up Plan:  Additional outreach attempts will be made to offer the patient care coordination information and services.   Encounter Outcome:  No Answer  Care Coordination Interventions Activated:  No   Care Coordination Interventions:  No, not indicated    Raina Mina, RN Care Management Coordinator [place] Office [phone]

## 2022-03-26 DIAGNOSIS — R293 Abnormal posture: Secondary | ICD-10-CM | POA: Diagnosis not present

## 2022-03-26 DIAGNOSIS — R202 Paresthesia of skin: Secondary | ICD-10-CM | POA: Diagnosis not present

## 2022-03-26 DIAGNOSIS — M542 Cervicalgia: Secondary | ICD-10-CM | POA: Diagnosis not present

## 2022-03-26 DIAGNOSIS — M5459 Other low back pain: Secondary | ICD-10-CM | POA: Diagnosis not present

## 2022-04-03 DIAGNOSIS — R202 Paresthesia of skin: Secondary | ICD-10-CM | POA: Diagnosis not present

## 2022-04-03 DIAGNOSIS — R293 Abnormal posture: Secondary | ICD-10-CM | POA: Diagnosis not present

## 2022-04-03 DIAGNOSIS — M542 Cervicalgia: Secondary | ICD-10-CM | POA: Diagnosis not present

## 2022-04-03 DIAGNOSIS — M5459 Other low back pain: Secondary | ICD-10-CM | POA: Diagnosis not present

## 2022-04-05 DIAGNOSIS — R202 Paresthesia of skin: Secondary | ICD-10-CM | POA: Diagnosis not present

## 2022-04-05 DIAGNOSIS — M542 Cervicalgia: Secondary | ICD-10-CM | POA: Diagnosis not present

## 2022-04-05 DIAGNOSIS — R293 Abnormal posture: Secondary | ICD-10-CM | POA: Diagnosis not present

## 2022-04-05 DIAGNOSIS — M5459 Other low back pain: Secondary | ICD-10-CM | POA: Diagnosis not present

## 2022-04-11 ENCOUNTER — Ambulatory Visit: Payer: Self-pay | Admitting: *Deleted

## 2022-04-11 ENCOUNTER — Encounter: Payer: Self-pay | Admitting: *Deleted

## 2022-04-11 NOTE — Patient Outreach (Signed)
  Care Coordination   Initial Visit Note   04/11/2022 Name: Hildur Bayer MRN: [id number] DOB: [birth year]  Deondra Labrador is a 61 y.o. year old female who sees [place], Malka So, MD for primary care. I spoke with  [name] by phone today.  What matters to the patients health and wellness today?  Cholesterol and healthy eating    Goals Addressed               This Visit's Progress     COMPLETED: Education on cholesterol & Healthy eating (pt-stated)        Care Coordination Interventions: Provided education to patient re: Cholesterol and healthy eating Reviewed medications with patient and discussed medication adherence and educations Reviewed scheduled/upcoming provider appointments including pending appointments and verified pt has completed her AWV on 11/08/2021 Screening for signs and symptoms of depression related to chronic disease state  Assessed social determinant of health barriers          SDOH assessments and interventions completed:  Yes  SDOH Interventions Today    Flowsheet Row Most Recent Value  SDOH Interventions   Food Insecurity Interventions Intervention Not Indicated  Housing Interventions Intervention Not Indicated  Transportation Interventions Intervention Not Indicated        Care Coordination Interventions Activated:  Yes  Care Coordination Interventions:  Yes, provided   Follow up plan: No further intervention required.   Encounter Outcome:  Pt. Visit Completed   Raina Mina, RN Care Management Coordinator [place] Office [phone]

## 2022-04-11 NOTE — Patient Instructions (Signed)
Visit Information  Thank you for taking time to visit with me today. Please don't hesitate to contact me if I can be of assistance to you.   Following are the goals we discussed today:   Goals Addressed               This Visit's Progress     COMPLETED: Education on cholesterol & Healthy eating (pt-stated)        Care Coordination Interventions: Provided education to patient re: Cholesterol and healthy eating Reviewed medications with patient and discussed medication adherence and educations Reviewed scheduled/upcoming provider appointments including pending appointments and verified pt has completed her AWV on 11/08/2021 Screening for signs and symptoms of depression related to chronic disease state  Assessed social determinant of health barriers          Please call the care guide team at [phone] if you need to cancel or reschedule your appointment.   If you are experiencing a Mental Health or [place] or need someone to talk to, please call the Suicide and Crisis Lifeline: 988 call the [place] National Suicide Prevention Lifeline: 843-834-4756 or TTY: 989-051-9697 TTY (336)025-9491) to talk to a trained counselor call 1-800-273-TALK (toll free, 24 hour hotline)  Patient verbalizes understanding of instructions and care plan provided today and agrees to view in [place]. Active MyChart status and patient understanding of how to access instructions and care plan via MyChart confirmed with patient.     No further follow up required: No additional needs    Raina Mina, RN Care Management Coordinator [place] Office [phone]

## 2022-04-12 DIAGNOSIS — M5459 Other low back pain: Secondary | ICD-10-CM | POA: Diagnosis not present

## 2022-04-12 DIAGNOSIS — R202 Paresthesia of skin: Secondary | ICD-10-CM | POA: Diagnosis not present

## 2022-04-12 DIAGNOSIS — R293 Abnormal posture: Secondary | ICD-10-CM | POA: Diagnosis not present

## 2022-04-12 DIAGNOSIS — M542 Cervicalgia: Secondary | ICD-10-CM | POA: Diagnosis not present

## 2022-04-13 DIAGNOSIS — C50911 Malignant neoplasm of unspecified site of right female breast: Secondary | ICD-10-CM | POA: Diagnosis not present

## 2022-04-17 DIAGNOSIS — R202 Paresthesia of skin: Secondary | ICD-10-CM | POA: Diagnosis not present

## 2022-04-17 DIAGNOSIS — M542 Cervicalgia: Secondary | ICD-10-CM | POA: Diagnosis not present

## 2022-04-17 DIAGNOSIS — R293 Abnormal posture: Secondary | ICD-10-CM | POA: Diagnosis not present

## 2022-04-17 DIAGNOSIS — M5459 Other low back pain: Secondary | ICD-10-CM | POA: Diagnosis not present

## 2022-04-17 DIAGNOSIS — C50911 Malignant neoplasm of unspecified site of right female breast: Secondary | ICD-10-CM | POA: Diagnosis not present

## 2022-04-30 ENCOUNTER — Telehealth: Payer: Self-pay

## 2022-04-30 DIAGNOSIS — C50911 Malignant neoplasm of unspecified site of right female breast: Secondary | ICD-10-CM | POA: Diagnosis not present

## 2022-04-30 DIAGNOSIS — E785 Hyperlipidemia, unspecified: Secondary | ICD-10-CM

## 2022-04-30 NOTE — Telephone Encounter (Signed)
-----   Message from Rodrigo Ran, [place] sent at 03/05/2022  2:46 PM EDT ----- Lipid & Alt

## 2022-04-30 NOTE — Telephone Encounter (Signed)
Can you get her scheduled for a lab appt? She does need to fast, orders are in. Thank you!

## 2022-05-01 ENCOUNTER — Other Ambulatory Visit (INDEPENDENT_AMBULATORY_CARE_PROVIDER_SITE_OTHER): Payer: BC Managed Care – PPO

## 2022-05-01 DIAGNOSIS — E785 Hyperlipidemia, unspecified: Secondary | ICD-10-CM | POA: Diagnosis not present

## 2022-05-01 LAB — LIPID PANEL
Cholesterol: 179 mg/dL (ref 0–200)
HDL: 77.8 mg/dL (ref >39.00)
LDL Cholesterol: 78 mg/dL (ref 0–99)
NonHDL: 101.54
Total CHOL/HDL Ratio: 2
Triglycerides: 116 mg/dL (ref 0.0–149.0)
VLDL: 23.2 mg/dL (ref 0.0–40.0)

## 2022-05-01 LAB — HEPATIC FUNCTION PANEL
ALT: 18 U/L (ref 0–35)
AST: 24 U/L (ref 0–37)
Albumin: 4.3 g/dL (ref 3.5–5.2)
Alkaline Phosphatase: 70 U/L (ref 39–117)
Bilirubin, Direct: 0.2 mg/dL (ref 0.0–0.3)
Total Bilirubin: 1.1 mg/dL (ref 0.2–1.2)
Total Protein: 7.6 g/dL (ref 6.0–8.3)

## 2022-05-02 ENCOUNTER — Other Ambulatory Visit: Payer: Self-pay | Admitting: Family Medicine

## 2022-05-02 DIAGNOSIS — Z1231 Encounter for screening mammogram for malignant neoplasm of breast: Secondary | ICD-10-CM

## 2022-05-07 DIAGNOSIS — C50911 Malignant neoplasm of unspecified site of right female breast: Secondary | ICD-10-CM | POA: Diagnosis not present

## 2022-05-22 DIAGNOSIS — M5459 Other low back pain: Secondary | ICD-10-CM | POA: Diagnosis not present

## 2022-05-22 DIAGNOSIS — R293 Abnormal posture: Secondary | ICD-10-CM | POA: Diagnosis not present

## 2022-05-22 DIAGNOSIS — M542 Cervicalgia: Secondary | ICD-10-CM | POA: Diagnosis not present

## 2022-05-22 DIAGNOSIS — R202 Paresthesia of skin: Secondary | ICD-10-CM | POA: Diagnosis not present

## 2022-05-24 DIAGNOSIS — R293 Abnormal posture: Secondary | ICD-10-CM | POA: Diagnosis not present

## 2022-05-24 DIAGNOSIS — R202 Paresthesia of skin: Secondary | ICD-10-CM | POA: Diagnosis not present

## 2022-05-24 DIAGNOSIS — M5459 Other low back pain: Secondary | ICD-10-CM | POA: Diagnosis not present

## 2022-05-24 DIAGNOSIS — M542 Cervicalgia: Secondary | ICD-10-CM | POA: Diagnosis not present

## 2022-05-28 ENCOUNTER — Other Ambulatory Visit: Payer: Self-pay | Admitting: Family Medicine

## 2022-05-28 DIAGNOSIS — C50511 Malignant neoplasm of lower-outer quadrant of right female breast: Secondary | ICD-10-CM

## 2022-05-29 ENCOUNTER — Ambulatory Visit
Admission: RE | Admit: 2022-05-29 | Discharge: 2022-05-29 | Disposition: A | Discharge status: Home / Self Care | Payer: BC Managed Care – PPO | Source: Ambulatory Visit | Attending: Family Medicine | Admitting: Family Medicine

## 2022-05-29 DIAGNOSIS — Z1231 Encounter for screening mammogram for malignant neoplasm of breast: Secondary | ICD-10-CM | POA: Diagnosis not present

## 2022-05-31 ENCOUNTER — Ambulatory Visit: Payer: BC Managed Care – PPO

## 2022-06-08 ENCOUNTER — Encounter: Payer: Self-pay | Admitting: Neurology

## 2022-06-25 ENCOUNTER — Telehealth: Payer: Self-pay | Admitting: *Deleted

## 2022-06-25 NOTE — Telephone Encounter (Signed)
Patient contacted the office stating she had the worse cold she has ever had. Patient was asking what she should do or what she could take. Patient advised she should seek evaluation at urgent care or with her PCP. Patient advised she does not need to hold her PLQ. Patient expressed understanding.

## 2022-06-27 ENCOUNTER — Telehealth: Payer: Self-pay

## 2022-06-27 NOTE — Telephone Encounter (Signed)
--  Caller states she has a severe cold, it started in Friday. Her chest feels tight at the moment. She has been taking auto immune suppressions. At night her ears pop and she has difficulty breathing. Denies fever.  06/25/2022 2:23:27 PM Go to ED Now [facility], RN, [facility]  Referrals GO TO FACILITY UNDECIDED  06/27/22 1013 - Pt states she's feeling much better; denies chest pain or difficulty breathing. States she has not seen a provider since speaking to triage. States she does not feel like she needs a OV at this time. Pt advised that if symptoms return to call & schedule OV. Pt verb understanding.

## 2022-07-19 NOTE — Progress Notes (Signed)
Office Visit Note  Patient: Tina [name]             Date of Birth: 09-11-1960           MRN: [id number]             PCP: Martinique, Betty G, MD Referring: Martinique, Betty G, MD Visit Date: 08/01/2022 Occupation: _0 @  Subjective:  Medication monitoring   History of Present Illness: Allexis Bordenave is a 61 y.o. female with history of autoimmune disease and osteoarthritis.  Patient is currently taking Plaquenil 200 mg 1 tablet by mouth daily.  She is tolerating Plaquenil without any side effects and has not missed any doses recently.  She denies any signs or symptoms of an autoimmune disease flare.  She has had some increased frequency of symptoms of Raynaud's phenomenon in her fingertips with colder weather temperatures.  She denies any digital ulcerations.  She has not any sores in her mouth or nose.  She denies any sicca symptoms.  Her energy level has been stable.  She denies any swollen lymph nodes.  She has not had any recent rashes, photosensitivity, or hair loss.  She denies any increased joint pain or joint swelling.  She has been walking 5 miles 5 days a week as well as swimming and practicing yoga for exercise.  She denies any recent falls or fractures.  She continues to take a calcium and vitamin D supplement daily.  She denies any recent or recurrent infections.  She denies any new medical conditions.    Activities of Daily Living:  Patient reports morning stiffness for 0 minutes.   Patient Denies nocturnal pain.  Difficulty dressing/grooming: Denies Difficulty climbing stairs: Denies Difficulty getting out of chair: Denies Difficulty using hands for taps, buttons, cutlery, and/or writing: Reports  Review of Systems  Constitutional:  Negative for fatigue.  HENT:  Positive for mouth dryness. Negative for mouth sores.   Eyes:  Positive for dryness.  Respiratory:  Negative for shortness of breath.   Cardiovascular:  Negative for chest pain and palpitations.   Gastrointestinal:  Negative for blood in stool, constipation and diarrhea.  Endocrine: Positive for cold intolerance. Negative for increased urination.  Genitourinary:  Negative for involuntary urination.  Musculoskeletal:  Positive for myalgias, muscle tenderness and myalgias. Negative for joint pain, gait problem, joint pain, joint swelling, muscle weakness and morning stiffness.  Skin:  Negative for color change, rash, hair loss and sensitivity to sunlight.  Allergic/Immunologic: Negative for susceptible to infections.  Neurological:  Positive for numbness. Negative for dizziness and headaches.  Hematological:  Negative for swollen glands.  Psychiatric/Behavioral:  Negative for depressed mood and sleep disturbance. The patient is not nervous/anxious.     PMFS History:  Patient Active Problem List   Diagnosis Date Noted   Controlled type 2 diabetes mellitus with complication, without long-term current use of insulin ([place]) 05/07/2019   Hyperlipidemia 05/05/2019   Obesity (BMI 30-39.9) 05/05/2019   Fibromyalgia 02/28/2016   Chronic low back pain 01/24/2016   Lupus ([place]) 04/19/2015   GERD (gastroesophageal reflux disease) 04/19/2015   Cervicogenic headache 02/10/2015   Neck pain 02/10/2015   Hot flashes 10/26/2014   GAD (generalized anxiety disorder) 10/26/2014   Peripheral neuropathy due to chemotherapy ([place]) 03/12/2014   Breast cancer of lower-outer quadrant of right female breast ([place]) 09/15/2013   Left shoulder pain - seeing Dr. Lorin Mercy 08/20/2013   Essential (primary) hypertension     Past Medical History:  Diagnosis Date   Anxiety  Breast cancer ([place]) 2015   Right Breast Cancer   Cancer of right breast ([place]) 09/03/13   Invasive Ductal Carcinoma/Ductal Carcinoma Insitu  biopsies   Chronic low back pain 01/24/2016   -and R hip pain -seen by rheumatologist and ortho in the past -MRI with Dr. Lorin Mercy in 2015, R hip plain films in 2015 as well acupuncture, injections, tramadol not  helpful trigger therapy at [facility] and PT helpful    Degenerative joint disease    back neck and shoulder   Fibromyalgia    Dr. Estanislado Pandy   GERD (gastroesophageal reflux disease)    hx esophagitis   Hot flashes    Hypertension    Leukopenia 06/16/2013   Lupus [facility])    rheumatologist - Dr. Estanislado Pandy   Migraines    Neuromuscular disorder [facility])    neuropathy from chemo in feet/hands   Personal history of chemotherapy    2015   Personal history of radiation therapy 2015   Right Breast Cancer   S/P radiation therapy 04/27/2014-06/07/2014   1) Right breast, 50 Gy in 25 fractions/ 2) Right breast boost, 10 Gy in 5 fractions    Family History  Problem Relation Age of Onset   Hypertension Mother    Dementia Mother        small vessel disease   Cancer Mother 87       cervical  cancer   Cancer Father 20       prostate ca   Diabetes Father    Arthritis Father    Cancer Half-Sister    Breast cancer Neg Hx    Past Surgical History:  Procedure Laterality Date   BREAST BIOPSY Left 2015   benign core   BREAST BIOPSY Right 2015   malignant core    BREAST LUMPECTOMY Right 2015   BREAST SURGERY  03/01/2014   hx right breast cancer- 3 lymph nodes removed and lumpectomy   CARPAL TUNNEL RELEASE  1998   rt   CESAREAN SECTION     x 3   PORTACATH PLACEMENT  08/2013   still currently active    ROBOTIC ASSISTED BILATERAL SALPINGO OOPHERECTOMY Bilateral 07/09/2014   Procedure: ROBOTIC ASSISTED BILATERAL SALPINGO OOPHORECTOM; :LYSIS OF ADHESIONS;  Surgeon: Marvene Staff, MD;  Location: [facility];  Service: Gynecology;  Laterality: Bilateral;   TRIGGER FINGER RELEASE  2004   rt thumb   trigger finger release left thumb  06/28/14   at orthopedic surgical center   [place] EXTRACTION     Social History   Social History Narrative   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      Home Situation: lives with husband and 90 yo son      Spiritual Beliefs: Christian       Lifestyle: elliptical 3-4 times per week; working on diet as well over last year in 2014            Immunization History  Administered Date(s) Administered   Influenza Split 06/16/2012   Influenza,inj,Quad PF,6+ Mos 06/17/2013, 04/23/2014, 04/25/2015, 05/17/2017, 05/05/2019, 06/30/2021   PFIZER(Purple Top)SARS-COV-2 Vaccination 10/09/2019, 11/02/2019   Pneumococcal Polysaccharide-23 02/16/2015     Objective: Vital Signs: BP (!) 143/91 (BP Location: Left Arm, Patient Position: Sitting, Cuff Size: Normal)   Pulse 82   Ht _0  (1.626 m)   Wt 155 lb 12.8 oz (70.7 kg)   LMP 09/28/2013 (Approximate)   BMI 26.74 kg/m    Physical Exam Vitals and nursing  note reviewed.  Constitutional:      Appearance: She is well-developed.  HENT:     Head: Normocephalic and atraumatic.  Eyes:     Conjunctiva/sclera: Conjunctivae normal.  Cardiovascular:     Rate and Rhythm: Normal rate and regular rhythm.     Heart sounds: Normal heart sounds.  Pulmonary:     Effort: Pulmonary effort is normal.     Breath sounds: Normal breath sounds.  Abdominal:     General: Bowel sounds are normal.     Palpations: Abdomen is soft.  Musculoskeletal:     Cervical back: Normal range of motion.  Skin:    General: Skin is warm and dry.     Capillary Refill: Capillary refill takes less than 2 seconds.     Comments: No malar rash noted. Some perioral hyperpigmentation noted.   Neurological:     Mental Status: She is alert and oriented to person, place, and time.  Psychiatric:        Behavior: Behavior normal.      Musculoskeletal Exam: C-spine, thoracic spine, lumbar spine have good range of motion with no discomfort.  No midline spinal tenderness or SI joint tenderness.  Shoulder joints, elbow joints, wrist joints, MCPs, PIPs, DIPs have good range of motion with no synovitis.  No tenderness or synovitis over MCP joints.  Complete fist formation bilaterally.  Hip joints have good range of motion with no  groin pain.  Knee joints have good range of motion with no warmth or effusion.  Ankle joints have good range of motion with no tenderness or joint swelling.  CDAI Exam: CDAI Score: -- Patient Global: --; Provider Global: -- Swollen: --; Tender: -- Joint Exam 08/01/2022   No joint exam has been documented for this visit   There is currently no information documented on the homunculus. Go to the Rheumatology activity and complete the homunculus joint exam.  Investigation: No additional findings.  Imaging: No results found.  Recent Labs: Lab Results  Component Value Date   WBC 3.5 (L) 02/27/2022   HGB 13.4 02/27/2022   PLT 198 02/27/2022   NA 143 02/27/2022   K 3.5 02/27/2022   CL 105 02/27/2022   CO2 30 02/27/2022   GLUCOSE 64 (L) 02/27/2022   BUN 11 02/27/2022   CREATININE 1.12 (H) 02/27/2022   BILITOT 1.1 05/01/2022   ALKPHOS 70 05/01/2022   AST 24 05/01/2022   ALT 18 05/01/2022   PROT 7.6 05/01/2022   ALBUMIN 4.3 05/01/2022   CALCIUM 10.1 02/27/2022   GFRAA 64 11/03/2020    Speciality Comments: PLQ eye exam: 09/18/2021 [facility]. Follow up in 6 months.  Procedures:  No procedures performed Allergies: Adhesive [tape], Other, and Nickel      Assessment / Plan:     Visit Diagnoses: Autoimmune disease ([place]) - + ANA 1:320 Speck[place]-patient had past Hx of neutropenia, fatigue, oral ulcers, sicca symptoms, parotitis, positive anti-CCP: She has not had any signs or symptoms of an autoimmune disease flare.  She has clinically been doing well taking Plaquenil 200 mg 1 tablet by mouth daily.  She is tolerating Plaquenil without any side effects and has not missed any doses recently.  She has no synovitis on examination.  She has had intermittent symptoms of Raynaud's phenomenon in her fingertips but has no digital ulcerations or signs of gangrene on exam.  Discussed conservative treatment options as well as the importance of avoiding triggers.  No Malar rash  noted.  She has  not had any photosensitivity.  No cervical lymphadenopathy.  She has not had any oral or nasal ulcerations.  No sicca symptoms.  No signs of alopecia noted.  No pleuritic chest pain, shortness of breath, or palpitations. Lab work from 02/27/22 was reviewed today in the office: dsDNA 5, complements WNL, ESR WNL,  protein creatinine ratio WNL.  The following lab work will be obtained today for further evaluation. Patient will remain on Plaquenil as monotherapy.  She was advised to notify us if she develops signs or symptoms of a flare.  She will follow-up in the office in 5 months or sooner if needed. - Plan: hydroxychloroquine (PLAQUENIL) 200 MG tablet, CBC with Differential/Platelet, COMPLETE METABOLIC PANEL WITH GFR, Protein / creatinine ratio, urine, Anti-DNA antibody, double-stranded, C3 and C4, Sedimentation rate  High risk medication use - Plaquenil 200 mg 1 tablet by mouth daily. CBC and CMP drawn on 02/27/22.  Orders for CBC and CMP released today.  PLQ eye exam: 09/18/2021 [facility].   - Plan: CBC with Differential/Platelet, COMPLETE METABOLIC PANEL WITH GFR  Cyclic citrullinated peptide (CCP) antibody positive - She has no clinical features of rheumatoid arthritis.  Primary osteoarthritis of both hands: She has no tenderness or inflammation on examination today.  She was able to make a complete fist bilaterally.  Discussed the importance of joint protection and muscle strengthening.  Primary osteoarthritis of both hips: She has good range of motion of both hip joints on examination today.  No groin pain currently.  Primary osteoarthritis of both feet: She is not experiencing any discomfort in her feet at this time.  She has been walking 5 miles 5 days a week for exercise.  DDD (degenerative disc disease), cervical: She has good range of motion of the C-spine with no discomfort at this time.  No symptoms of radiculopathy.  Trapezius muscle spasm -  Resolved  Chronic right SI joint pain: She has no SI joint tenderness upon palpation today.  Fibromyalgia: She has not had any signs or symptoms of a fibromyalgia flare recently.  She is not experiencing any myalgias or muscle tenderness at this time.  She remains active walking, practicing yoga, and swimming for exercise.  Other medical conditions are listed as follows:  Osteopenia, unspecified location - DXA done by her GYN on 02/08/2020 showed a T score of -1.6, BMD 0.979 in the lumbar region.  Advised to schedule an updated bone density.  She continues to take a calcium and vitamin D supplement daily.  She has not had any recent falls or fractures.  Peripheral neuropathy due to chemotherapy ([place]) - Followed by Dr.Aquino  Malignant neoplasm of lower-outer quadrant of right breast of female, estrogen receptor positive ([place]) - 2015, lumpectomy, oopherectomy, CTX, RTX  Essential hypertension  Orders: Orders Placed This Encounter  Procedures   CBC with Differential/Platelet   COMPLETE METABOLIC PANEL WITH GFR   Protein / creatinine ratio, urine   Anti-DNA antibody, double-stranded   C3 and C4   Sedimentation rate   Meds ordered this encounter  Medications   hydroxychloroquine (PLAQUENIL) 200 MG tablet    Sig: Take 1 tablet (200 mg total) by mouth daily.    Dispense:  90 tablet    Refill:  0      Follow-Up Instructions: Return in about 5 months (around 12/31/2022) for Autoimmune Disease.   Ofilia Neas, PA-C  Note - This record has been created using Dragon software.  Chart creation errors have been sought, but may  not always  have been located. Such creation errors do not reflect on  the standard of medical care.

## 2022-08-01 ENCOUNTER — Ambulatory Visit: Payer: BC Managed Care – PPO | Attending: Physician Assistant | Admitting: Physician Assistant

## 2022-08-01 ENCOUNTER — Encounter: Payer: Self-pay | Admitting: Physician Assistant

## 2022-08-01 VITALS — BP 125/83 | HR 81 | Ht 64.0 in | Wt 155.8 lb

## 2022-08-01 DIAGNOSIS — T451X5A Adverse effect of antineoplastic and immunosuppressive drugs, initial encounter: Secondary | ICD-10-CM

## 2022-08-01 DIAGNOSIS — I10 Essential (primary) hypertension: Secondary | ICD-10-CM

## 2022-08-01 DIAGNOSIS — C50511 Malignant neoplasm of lower-outer quadrant of right female breast: Secondary | ICD-10-CM

## 2022-08-01 DIAGNOSIS — M19041 Primary osteoarthritis, right hand: Secondary | ICD-10-CM | POA: Diagnosis not present

## 2022-08-01 DIAGNOSIS — M5030 Other cervical disc degeneration, unspecified cervical region: Secondary | ICD-10-CM

## 2022-08-01 DIAGNOSIS — M533 Sacrococcygeal disorders, not elsewhere classified: Secondary | ICD-10-CM | POA: Diagnosis not present

## 2022-08-01 DIAGNOSIS — G8929 Other chronic pain: Secondary | ICD-10-CM

## 2022-08-01 DIAGNOSIS — M797 Fibromyalgia: Secondary | ICD-10-CM

## 2022-08-01 DIAGNOSIS — M359 Systemic involvement of connective tissue, unspecified: Secondary | ICD-10-CM | POA: Diagnosis not present

## 2022-08-01 DIAGNOSIS — M62838 Other muscle spasm: Secondary | ICD-10-CM

## 2022-08-01 DIAGNOSIS — R768 Other specified abnormal immunological findings in serum: Secondary | ICD-10-CM | POA: Diagnosis not present

## 2022-08-01 DIAGNOSIS — M19071 Primary osteoarthritis, right ankle and foot: Secondary | ICD-10-CM | POA: Diagnosis not present

## 2022-08-01 DIAGNOSIS — G620 Drug-induced polyneuropathy: Secondary | ICD-10-CM

## 2022-08-01 DIAGNOSIS — Z79899 Other long term (current) drug therapy: Secondary | ICD-10-CM | POA: Diagnosis not present

## 2022-08-01 DIAGNOSIS — M19072 Primary osteoarthritis, left ankle and foot: Secondary | ICD-10-CM

## 2022-08-01 DIAGNOSIS — M160 Bilateral primary osteoarthritis of hip: Secondary | ICD-10-CM | POA: Diagnosis not present

## 2022-08-01 DIAGNOSIS — M8580 Other specified disorders of bone density and structure, unspecified site: Secondary | ICD-10-CM

## 2022-08-01 DIAGNOSIS — Z170 Estrogen receptor positive status [ER+]: Secondary | ICD-10-CM

## 2022-08-01 DIAGNOSIS — M19042 Primary osteoarthritis, left hand: Secondary | ICD-10-CM

## 2022-08-01 MED ORDER — HYDROXYCHLOROQUINE SULFATE 200 MG PO TABS: 200.0000 mg | ORAL_TABLET | Freq: Every day | ORAL | 0 refills | Status: AC

## 2022-08-02 ENCOUNTER — Other Ambulatory Visit: Payer: Self-pay | Admitting: *Deleted

## 2022-08-02 NOTE — Progress Notes (Signed)
dsDNA is negative

## 2022-08-02 NOTE — Progress Notes (Signed)
WBC count is low-3.3.  absolute neutrophils and monocytes are low.  Rest of CBC WNL.  Recommend rechecking CBC with diff in 1 month.  CMP WNL. ESR WNL Protein creatinine ratio WNL.

## 2022-08-03 LAB — COMPLETE METABOLIC PANEL WITH GFR
AG Ratio: 1.3 (calc) (ref 1.0–2.5)
ALT: 14 U/L (ref 6–29)
AST: 22 U/L (ref 10–35)
Albumin: 4.5 g/dL (ref 3.6–5.1)
Alkaline phosphatase (APISO): 73 U/L (ref 37–153)
BUN: 12 mg/dL (ref 7–25)
CO2: 31 mmol/L (ref 20–32)
Calcium: 9.8 mg/dL (ref 8.6–10.4)
Chloride: 103 mmol/L (ref 98–110)
Creat: 0.98 mg/dL (ref 0.50–1.03)
Globulin: 3.4 g/dL (calc) (ref 1.9–3.7)
Glucose, Bld: 72 mg/dL (ref 65–99)
Potassium: 3.7 mmol/L (ref 3.5–5.3)
Sodium: 142 mmol/L (ref 135–146)
Total Bilirubin: 0.9 mg/dL (ref 0.2–1.2)
Total Protein: 7.9 g/dL (ref 6.1–8.1)
eGFR: 66 mL/min/{1.73_m2} (ref >=60)

## 2022-08-03 LAB — PROTEIN / CREATININE RATIO, URINE
Creatinine, Urine: 260 mg/dL (ref 20–275)
Protein/Creat Ratio: 65 mg/g creat (ref 24–184)
Protein/Creatinine Ratio: 0.065 mg/mg creat (ref 0.024–0.184)
Total Protein, Urine: 17 mg/dL (ref 5–24)

## 2022-08-03 LAB — SEDIMENTATION RATE: Sed Rate: 11 mm/h (ref 0–30)

## 2022-08-03 LAB — CBC WITH DIFFERENTIAL/PLATELET
Absolute Monocytes: 191 cells/uL — ABNORMAL LOW (ref 200–950)
Basophils Absolute: 30 cells/uL (ref 0–200)
Basophils Relative: 0.9 %
Eosinophils Absolute: 40 cells/uL (ref 15–500)
Eosinophils Relative: 1.2 %
HCT: 40.1 % (ref 35.0–45.0)
Hemoglobin: 13.7 g/dL (ref 11.7–15.5)
Lymphs Abs: 1690 cells/uL (ref 850–3900)
MCH: 30.9 pg (ref 27.0–33.0)
MCHC: 34.2 g/dL (ref 32.0–36.0)
MCV: 90.5 fL (ref 80.0–100.0)
MPV: 10.2 fL (ref 7.5–12.5)
Monocytes Relative: 5.8 %
Neutro Abs: 1350 cells/uL — ABNORMAL LOW (ref 1500–7800)
Neutrophils Relative %: 40.9 %
Platelets: 214 10*3/uL (ref 140–400)
RBC: 4.43 10*6/uL (ref 3.80–5.10)
RDW: 12.5 % (ref 11.0–15.0)
Total Lymphocyte: 51.2 %
WBC: 3.3 10*3/uL — ABNORMAL LOW (ref 3.8–10.8)

## 2022-08-03 LAB — C3 AND C4
C3 Complement: 141 mg/dL (ref 83–193)
C4 Complement: 24 mg/dL (ref 15–57)

## 2022-08-03 LAB — ANTI-DNA ANTIBODY, DOUBLE-STRANDED: ds DNA Ab: 4 IU/mL

## 2022-08-03 NOTE — Progress Notes (Signed)
Complements WNL. Labs are not consistent with a flare.

## 2022-08-20 ENCOUNTER — Ambulatory Visit: Payer: BC Managed Care – PPO | Admitting: Neurology

## 2022-08-27 ENCOUNTER — Encounter: Payer: Self-pay | Admitting: Neurology

## 2022-08-27 ENCOUNTER — Ambulatory Visit (INDEPENDENT_AMBULATORY_CARE_PROVIDER_SITE_OTHER): Payer: BC Managed Care – PPO | Admitting: Neurology

## 2022-08-27 DIAGNOSIS — G43009 Migraine without aura, not intractable, without status migrainosus: Secondary | ICD-10-CM | POA: Diagnosis not present

## 2022-08-27 DIAGNOSIS — G620 Drug-induced polyneuropathy: Secondary | ICD-10-CM

## 2022-08-27 DIAGNOSIS — T451X5A Adverse effect of antineoplastic and immunosuppressive drugs, initial encounter: Secondary | ICD-10-CM

## 2022-08-27 MED ORDER — SUMATRIPTAN SUCCINATE 25 MG PO TABS: 25.0000 mg | ORAL_TABLET | ORAL | 11 refills | Status: AC | PRN

## 2022-08-27 MED ORDER — GABAPENTIN 600 MG PO TABS: ORAL_TABLET | ORAL | 3 refills | Status: AC

## 2022-08-27 NOTE — Progress Notes (Signed)
NEUROLOGY FOLLOW UP OFFICE NOTE  Tina [name] [id number] 1961-01-29  HISTORY OF PRESENT ILLNESS: I had the pleasure of seeing Tina [name] in follow-up in the neurology clinic on 08/27/2022.  The patient was last seen in our office by [name], PA-C for headaches and neuropathy. Records and images were personally reviewed where available.  She had a dental procedure and started having right-sided facial pain for a month. She went back to her dentist and after bone was shaved down, headaches resolved. She reports that everything has been good. She cannot recall the last time she had a headache. She has not used the prn sumatriptan. The neuropathy has not been bothersome, she denies any burning or tingling. No dizziness, vision changes, no falls. She is on Gabapentin '600mg'$  TID and forgets the mid-day dose most of the time with no issues. Sleep is very good.    HISTORY OF PRESENT ILLNESS 02/08/21: This is a very pleasant 62 year old right-handed woman with a history of breast cancer s/p radiation and chemotherapy with neuropathy due to chemotherapy, hypertension, previously seen in our office from 2016 to 2019 for headaches. In 2016 she presented with a different kind of headache with pain on the right hemisphere radiating down the right side of her face and neck. There was occasional tingling in her scalp. There would be nausea/vomiting. Brain MRI in 2016 was normal. Cervical MRI showed multilevel degenerative changes with central disc herniation, no cord edema. Headaches were felt due to be cervicogenic in nature, improved with physical therapy and gabapentin. She started having left-sided facial pain in June 2019 reporting stabbing pain on the left facial area and ear/neck. Brain MRI/trigeminal protocol in 02/2018 was normal. Gabapentin was increased to '600mg'$  TID. She was lost to follow-up for 3 years and reports that the symptoms overall quieted down and she was taking the Gabapentin '600mg'$  TID more  for the neuropathy, noticing that when she misses a dose, she feels symptoms in her feet. When she takes it regularly, feet and hands feel good with no symptoms. She denies any further neck pain. She has had a couple of episodes of left-sided facial pain since her last visit lasting for a week. She feels her teeth may also have contributed, she had teeth pulled recently. She called our office last April 2022 to report facial pain and headache for 2 weeks, taking 3-4 Tylenol daily. It would start as a muscle spasm then pain and headache starts. She went to the ER where head CT did not show any acute changes. She was given a migraine cocktail with significant relief in symptoms. She reports that she then found out she had COVID and that when she got better from [place], the headaches resolved with no recurrence in the past 3 months. She has prn sumatriptan and has not needed it since April. She has a nagging headache every now and then. She reports an episode of brief loss of consciousness 2 weeks ago, she was exhausted, doing a lot of running around and a little stressed out. She was sitting on the porch then stood up and everything got bright. Her husband tried to catch her, she was out very briefly and when she woke up, BP was extremely low. She exercises regularly. She denies any dizziness, diplopia, dysarthria/dysphagia. Sleep is good.    PAST MEDICAL HISTORY: Past Medical History:  Diagnosis Date   Anxiety    Breast cancer ([place]) 2015   Right Breast Cancer   Cancer of right  breast ([place]) 09/03/13   Invasive Ductal Carcinoma/Ductal Carcinoma Insitu  biopsies   Chronic low back pain 01/24/2016   -and R hip pain -seen by rheumatologist and ortho in the past -MRI with Dr. Lorin Mercy in 2015, R hip plain films in 2015 as well acupuncture, injections, tramadol not helpful trigger [facility] and PT helpful    Degenerative joint disease    back neck and shoulder   Fibromyalgia    Dr. Estanislado Pandy    GERD (gastroesophageal reflux disease)    hx esophagitis   Hot flashes    Hypertension    Leukopenia 06/16/2013   Lupus [facility])    rheumatologist - Dr. Estanislado Pandy   Migraines    Neuromuscular disorder [facility])    neuropathy from chemo in feet/hands   Personal history of chemotherapy    2015   Personal history of radiation therapy 2015   Right Breast Cancer   S/P radiation therapy 04/27/2014-06/07/2014   1) Right breast, 50 Gy in 25 fractions/ 2) Right breast boost, 10 Gy in 5 fractions    MEDICATIONS: Current Outpatient Medications on File Prior to Visit  Medication Sig Dispense Refill   Accu-Chek Softclix Lancets lancets Use to check blood sugars 1-2 times daily. 100 each 12   albuterol (VENTOLIN HFA) 108 (90 Base) MCG/ACT inhaler Inhale 2 puffs into the lungs every 6 (six) hours as needed for wheezing or shortness of breath. 8 g 0   aspirin 81 MG tablet Take 81 mg by mouth daily.     b complex vitamins capsule Take 1 capsule by mouth daily.     BIOTIN 5000 PO Take 1 capsule by mouth. Takes sometimes     Blood Glucose Monitoring Suppl (ACCU-CHEK GUIDE) w/Device KIT Use to check blood sugars 1-2 times daily. 1 kit 0   Calcium Carbonate-Vit D-Min (CALTRATE 600+D PLUS MINERALS PO) Take 1 tablet by mouth daily.     Cholecalciferol (VITAMIN D3 PO) Take by mouth.     gabapentin (NEURONTIN) 600 MG tablet Take 1 tablet (600 mg total) by mouth 3 (three) times daily. 270 tablet 3   glucose blood (ACCU-CHEK GUIDE) test strip Use to check blood sugars 1-2 times daily. 100 each 12   HEMP OIL-VANILLYL BUTYL ETHER EX Apply topically as needed.     HYDROcodone-acetaminophen (NORCO/VICODIN) 5-325 MG tablet Take 1 tablet by mouth every 6 (six) hours as needed for moderate pain. 20 tablet 0   hydroxychloroquine (PLAQUENIL) 200 MG tablet Take 1 tablet (200 mg total) by mouth daily. 90 tablet 0   lisinopril-hydrochlorothiazide (ZESTORETIC) 20-12.5 MG tablet Take 0.5 tablets by mouth daily. 90 tablet 2    loratadine (CLARITIN) 10 MG tablet Take 10 mg by mouth as needed.     MAGNESIUM PO Take by mouth daily.     omeprazole (PRILOSEC) 40 MG capsule TAKE 1 CAPSULE BY MOUTH ONCE DAILY BEFORE BREAKFAST 90 capsule 1   Propylene Glycol (SYSTANE COMPLETE OP) Apply to eye.     rosuvastatin (CRESTOR) 20 MG tablet Take 1 tablet (20 mg total) by mouth daily. 90 tablet 3   SUMAtriptan (IMITREX) 25 MG tablet Take 1 tablet (25 mg total) by mouth every 2 (two) hours as needed for migraine. May repeat in 2 hours if needed. Do not take more then 2 tablets in 24 hours. 10 tablet 11   TURMERIC PO Take by mouth daily.     UNABLE TO FIND Apply topically as needed. THC oil     No current facility-administered  medications on file prior to visit.    ALLERGIES: Allergies  Allergen Reactions   Adhesive [Tape] Itching    Burned and scarred skinned   Other     Cholroprep-causes rash   Nickel Rash    FAMILY HISTORY: Family History  Problem Relation Age of Onset   Hypertension Mother    Dementia Mother        small vessel disease   Cancer Mother 22       cervical  cancer   Cancer Father 9       prostate ca   Diabetes Father    Arthritis Father    Cancer Half-Sister    Breast cancer Neg Hx     SOCIAL HISTORY: Social History   Socioeconomic History   Marital status: Married    Spouse name: Not on file   Number of children: 1   Years of education: Not on file   Highest education level: Not on file  Occupational History    Comment: homemaker  Tobacco Use   Smoking status: Former    Packs/day: 1.00    Years: 15.00    Total pack years: 15.00    Types: Cigars, Cigarettes    Quit date: 02/23/2004    Years since quitting: 18.5    Passive exposure: Current (minimal)   Smokeless tobacco: Never   Tobacco comments:    remote smoking history  Vaping Use   Vaping Use: Never used  Substance and Sexual Activity   Alcohol use: Yes    Alcohol/week: 0.0 standard drinks of alcohol    Comment: rare    Drug use: Yes    Types: Marijuana    Comment: Marijuana as needed    Sexual activity: Yes  Other Topics Concern   Not on file  Social History Narrative   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      Home Situation: lives with husband and 42 yo son      Spiritual Beliefs: Christian      Lifestyle: elliptical 3-4 times per week; working on diet as well over last year in 2014            Social Determinants of Health   Financial Resource Strain: Low Risk  (11/08/2021)   Overall Financial Resource Strain (CARDIA)    Difficulty of Paying Living Expenses: Not hard at all  Food Insecurity: No Food Insecurity (04/11/2022)   Hunger Vital Sign    Worried About Running Out of Food in the Last Year: Never true    [place] in the Last Year: Never true  Transportation Needs: No Transportation Needs (04/11/2022)   PRAPARE - Hydrologist (Medical): No    Lack of Transportation (Non-Medical): No  Physical Activity: Sufficiently Active (11/08/2021)   Exercise Vital Sign    Days of Exercise per Week: 5 days    Minutes of Exercise per Session: 60 min  Stress: No Stress Concern Present (11/08/2021)   [place]    Feeling of Stress : Not at all  Social Connections: Moderately Isolated (11/08/2021)   Social Connection and Isolation Panel [NHANES]    Frequency of Communication with Friends and Family: More than three times a week    Frequency of Social Gatherings with Friends and Family: More than three times a week    Attends Religious Services: Never    Marine scientist or Organizations: No    Attends [employer]  or Organization Meetings: Never    Marital Status: Married  Human resources officer Violence: Not At Risk (11/08/2021)   Humiliation, Afraid, Rape, and Kick questionnaire    Fear of Current or Ex-Partner: No    Emotionally Abused: No    Physically Abused: No    Sexually Abused: No     PHYSICAL  EXAM: Vitals:   08/27/22 1553 08/27/22 1624  BP: (!) 157/109 (!) 145/93  Pulse: 86 84  Resp: 20   SpO2: 98% 99%   General: No acute distress Head:  Normocephalic/atraumatic Skin/Extremities: No rash, no edema Neurological Exam: alert and awake. No aphasia or dysarthria. Fund of knowledge is appropriate.  Attention and concentration are normal.   Cranial nerves: Pupils equal, round. Extraocular movements intact with no nystagmus. Visual fields full.  No facial asymmetry.  Motor: Bulk and tone normal, muscle strength 5/5 throughout with no pronator drift.   Finger to nose testing intact.  Gait narrow-based and steady, able to tandem walk adequately.  Romberg negative.   IMPRESSION: This is a very pleasant 62 yo RH woman with a history of breast cancer s/p radiation and chemotherapy with neuropathy due to chemotherapy, hypertension, migraines. Migraines and neuropathy have been stable, she denies any symptoms. We agreed to try reducing Gabapentin to '600mg'$  BID and monitor response. She has prn sumatriptan for migraine rescue. Continue to monitor BP with PCP. Follow-up in 1 year, she knows to call for any changes.   Thank you for allowing me to participate in her care.  Please do not hesitate to call for any questions or concerns.    Ellouise Newer, M.D.   CC: Dr. [place]

## 2022-08-27 NOTE — Patient Instructions (Signed)
Always a pleasure to see you. Reduce Gabapentin '600mg'$ : take 1 tablet twice a day. Let me know if any issues on lower dose. Refills also sent for as needed sumatriptan for migraine rescue. Follow-up in 1 year, call for any changes.

## 2022-11-12 ENCOUNTER — Ambulatory Visit (INDEPENDENT_AMBULATORY_CARE_PROVIDER_SITE_OTHER): Payer: BC Managed Care – PPO

## 2022-11-12 VITALS — Ht 64.0 in | Wt 144.0 lb

## 2022-11-12 DIAGNOSIS — Z0000 Encounter for general adult medical examination without abnormal findings: Secondary | ICD-10-CM | POA: Diagnosis not present

## 2022-11-12 NOTE — Progress Notes (Signed)
Subjective:   Tina [name] is a 62 y.o. female who presents for Medicare Annual (Subsequent) preventive examination.  Review of Systems    Virtual Visit via Telephone Note  I connected with  Tina [name] on 11/12/22 at 10:00 AM EDT by telephone and verified that I am speaking with the correct person using two identifiers.  Location: Patient: Home Provider: Office Persons participating in the virtual visit: patient/Nurse Health Advisor   I discussed the limitations, risks, security and privacy concerns of performing an evaluation and management service by telephone and the availability of in person appointments. The patient expressed understanding and agreed to proceed.  Interactive audio and video telecommunications were attempted between this nurse and patient, however failed, due to patient having technical difficulties OR patient did not have access to video capability.  We continued and completed visit with audio only.  Some vital signs may be absent or patient reported.   Tina Rung, LPN  Cardiac Risk Factors include: advanced age (>30men, >77 women);hypertension     Objective:    Today's Vitals   11/12/22 1009  Weight: 144 lb (65.3 kg)  Height: 5\' 4"  (1.626 m)   Body mass index is 24.72 kg/m.     11/12/2022   10:18 AM 08/27/2022    3:54 PM 11/08/2021    9:27 AM 08/18/2021   10:55 AM 11/16/2020    9:21 AM 11/07/2020    3:14 PM 04/23/2017    4:29 PM  Advanced Directives  Does Patient Have a Medical Advance Directive? No No No No No No Yes  Would patient like information on creating a medical advance directive? No - Patient declined  No - Patient declined  No - Patient declined No - Patient declined     Current Medications (verified) Outpatient Encounter Medications as of 11/12/2022  Medication Sig   Accu-Chek Softclix Lancets lancets Use to check blood sugars 1-2 times daily.   albuterol (VENTOLIN HFA) 108 (90 Base) MCG/ACT inhaler Inhale 2 puffs into the  lungs every 6 (six) hours as needed for wheezing or shortness of breath.   aspirin 81 MG tablet Take 81 mg by mouth daily.   b complex vitamins capsule Take 1 capsule by mouth daily.   BIOTIN 5000 PO Take 1 capsule by mouth. Takes sometimes   Blood Glucose Monitoring Suppl (ACCU-CHEK GUIDE) w/Device KIT Use to check blood sugars 1-2 times daily.   Calcium Carbonate-Vit D-Min (CALTRATE 600+D PLUS MINERALS PO) Take 1 tablet by mouth daily.   Cholecalciferol (VITAMIN D3 PO) Take by mouth.   gabapentin (NEURONTIN) 600 MG tablet Take 1 tablet twice a day   glucose blood (ACCU-CHEK GUIDE) test strip Use to check blood sugars 1-2 times daily.   HEMP OIL-VANILLYL BUTYL ETHER EX Apply topically as needed.   HYDROcodone-acetaminophen (NORCO/VICODIN) 5-325 MG tablet Take 1 tablet by mouth every 6 (six) hours as needed for moderate pain.   hydroxychloroquine (PLAQUENIL) 200 MG tablet Take 1 tablet (200 mg total) by mouth daily.   lisinopril-hydrochlorothiazide (ZESTORETIC) 20-12.5 MG tablet Take 0.5 tablets by mouth daily.   loratadine (CLARITIN) 10 MG tablet Take 10 mg by mouth as needed.   MAGNESIUM PO Take by mouth daily.   omeprazole (PRILOSEC) 40 MG capsule TAKE 1 CAPSULE BY MOUTH ONCE DAILY BEFORE BREAKFAST   Propylene Glycol (SYSTANE COMPLETE OP) Apply to eye.   rosuvastatin (CRESTOR) 20 MG tablet Take 1 tablet (20 mg total) by mouth daily.   SUMAtriptan (IMITREX) 25 MG tablet Take 1  tablet (25 mg total) by mouth every 2 (two) hours as needed for migraine. May repeat in 2 hours if needed. Do not take more then 2 tablets in 24 hours.   TURMERIC PO Take by mouth daily.   UNABLE TO FIND Apply topically as needed. THC oil   No facility-administered encounter medications on file as of 11/12/2022.    Allergies (verified) Adhesive [tape], Other, and Nickel   History: Past Medical History:  Diagnosis Date   Anxiety    Breast cancer 2015   Right Breast Cancer   Cancer of right breast 09/03/13    Invasive Ductal Carcinoma/Ductal Carcinoma Insitu  biopsies   Chronic low back pain 01/24/2016   -and R hip pain -seen by rheumatologist and ortho in the past -MRI with Dr. Ophelia Charter in 2015, R hip plain films in 2015 as well acupuncture, injections, tramadol not helpful trigger therapy at [facility] and PT helpful    Degenerative joint disease    back neck and shoulder   Fibromyalgia    Dr. Corliss Skains   GERD (gastroesophageal reflux disease)    hx esophagitis   Hot flashes    Hypertension    Leukopenia 06/16/2013   Lupus    rheumatologist - Dr. 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   Personal history of chemotherapy    2015   Personal history of radiation therapy 2015   Right Breast Cancer   S/P radiation therapy 04/27/2014-06/07/2014   1) Right breast, 50 Gy in 25 fractions/ 2) Right breast boost, 10 Gy in 5 fractions   Past Surgical History:  Procedure Laterality Date   BREAST BIOPSY Left 2015   benign core   BREAST BIOPSY Right 2015   malignant core    BREAST LUMPECTOMY Right 2015   BREAST SURGERY  03/01/2014   hx right breast cancer- 3 lymph nodes removed and lumpectomy   CARPAL TUNNEL RELEASE  1998   rt   CESAREAN SECTION     x 3   PORTACATH PLACEMENT  08/2013   still currently active    ROBOTIC ASSISTED BILATERAL SALPINGO OOPHERECTOMY Bilateral 07/09/2014   Procedure: ROBOTIC ASSISTED BILATERAL SALPINGO OOPHORECTOM; :LYSIS OF ADHESIONS;  Surgeon: Serita Kyle, MD;  Location: [facility];  Service: Gynecology;  Laterality: Bilateral;   TRIGGER FINGER RELEASE  2004   rt thumb   trigger finger release left thumb  06/28/14   at orthopedic surgical center   TUBAL LIGATION     WISDOM TOOTH EXTRACTION     Family History  Problem Relation Age of Onset   Hypertension Mother    Dementia Mother        small vessel disease   Cancer Mother 52       cervical  cancer   Cancer Father 36       prostate ca   Diabetes  Father    Arthritis Father    Cancer Half-Sister    Breast cancer Neg Hx    Social History   Socioeconomic History   Marital status: Married    Spouse name: Not on file   Number of children: 1   Years of education: Not on file   Highest education level: Not on file  Occupational History    Comment: homemaker  Tobacco Use   Smoking status: Former    Packs/day: 1.00    Years: 15.00    Additional pack years: 0.00    Total pack years:  15.00    Types: Cigars, Cigarettes    Quit date: 02/23/2004    Years since quitting: 18.7    Passive exposure: Current (minimal)   Smokeless tobacco: Never   Tobacco comments:    remote smoking history  Vaping Use   Vaping Use: Never used  Substance and Sexual Activity   Alcohol use: Yes    Alcohol/week: 0.0 standard drinks of alcohol    Comment: rare   Drug use: Yes    Types: Marijuana    Comment: Marijuana as needed    Sexual activity: Yes  Other Topics Concern   Not on file  Social History Narrative   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      Home Situation: lives with husband and 39 yo son      Spiritual Beliefs: Christian      Lifestyle: elliptical 3-4 times per week; working on diet as well over last year in 2014            Social Determinants of Health   Financial Resource Strain: Low Risk  (11/12/2022)   Overall Financial Resource Strain (CARDIA)    Difficulty of Paying Living Expenses: Not hard at all  Food Insecurity: No Food Insecurity (11/12/2022)   Hunger Vital Sign    Worried About Running Out of Food in the Last Year: Never true    Ran Out of Food in the Last Year: Never true  Transportation Needs: No Transportation Needs (11/12/2022)   PRAPARE - Administrator, Civil Service (Medical): No    Lack of Transportation (Non-Medical): No  Physical Activity: Sufficiently Active (11/12/2022)   Exercise Vital Sign    Days of Exercise per Week: 3 days    Minutes of Exercise per Session: 60 min  Stress: No Stress  Concern Present (11/12/2022)   [employer] of Occupational Health - Occupational Stress Questionnaire    Feeling of Stress : Not at all  Social Connections: Moderately Integrated (11/12/2022)   Social Connection and Isolation Panel [NHANES]    Frequency of Communication with Friends and Family: More than three times a week    Frequency of Social Gatherings with Friends and Family: More than three times a week    Attends Religious Services: Never    Database administrator or Organizations: Yes    Attends Engineer, structural: More than 4 times per year    Marital Status: Married    Tobacco Counseling Counseling given: Not Answered Tobacco comments: remote smoking history   Clinical Intake:  Pre-visit preparation completed: No  Pain : No/denies pain     BMI - recorded: 24.72 Nutritional Status: BMI of 19-24  Normal Nutritional Risks: None Diabetes: No  How often do you need to have someone help you when you read instructions, pamphlets, or other written materials from your doctor or pharmacy?: 1 - Never  Diabetic?  No  Interpreter Needed?: No  Information entered by :: Theresa Mulligan LPN   Activities of Daily Living    11/12/2022   10:16 AM  In your present state of health, do you have any difficulty performing the following activities:  Hearing? 0  Vision? 0  Difficulty concentrating or making decisions? 0  Walking or climbing stairs? 0  Dressing or bathing? 0  Doing errands, shopping? 0  Preparing Food and eating ? N  Using the Toilet? N  In the past six months, have you accidently leaked urine? N  Do you have problems with loss of  bowel control? N  Managing your Medications? N  Managing your Finances? N  Housekeeping or managing your Housekeeping? N    Patient Care Team: [place], Betty G, MD as PCP - General (Family Medicine) Beverely Pace, Smitty Cords, MD as Consulting Physician (Emergency Medicine) Artis Delay, MD as Consulting Physician (Hematology  and Oncology) Kathreen Cosier, MD as Consulting Physician (Obstetrics and Gynecology) Pollyann Savoy, MD as Consulting Physician (Rheumatology) Van Clines, MD as Consulting Physician (Neurology) Serena Croissant, MD as Consulting Physician (Hematology and Oncology) Alejandro Mulling, RN as [facility] Care Management Alejandro Mulling, RN as [facility] Care Management  Indicate any recent Medical Services you may have received from other than [facility] providers in the past year (date may be approximate).     Assessment:   This is a routine wellness examination for Tina [name].  Hearing/Vision screen Hearing Screening - Comments:: Denies hearing difficulties   Vision Screening - Comments:: Wears rx glasses - up to date with routine eye exams with  Dr Alben Spittle  Dietary issues and exercise activities discussed: Exercise limited by: None identified   Goals Addressed               This Visit's Progress     Stay healthy (pt-stated)         Depression Screen    11/12/2022   10:15 AM 04/11/2022   11:44 AM 02/26/2022   10:01 AM 11/08/2021    9:21 AM 10/20/2021   10:32 AM 06/30/2021    2:15 PM 11/07/2020    3:17 PM  PHQ 2/9 Scores  PHQ - 2 Score 0 0 0 0 0 0 2  PHQ- 9 Score      0 10    Fall Risk    11/12/2022   10:16 AM 08/27/2022    3:53 PM 02/26/2022   10:01 AM 11/08/2021    9:25 AM 08/18/2021   10:55 AM  Fall Risk   Falls in the past year? 0 0 0 0 0  Number falls in past yr: 0 0 0 0 0  Injury with Fall? 0 0 0 0 0  Risk for fall due to : No Fall Risks  No Fall Risks No Fall Risks   Follow up Falls prevention discussed Falls evaluation completed Falls evaluation completed      FALL RISK PREVENTION PERTAINING TO THE HOME:  Any stairs in or around the home? Yes  If so, are there any without handrails? No  Home free of loose throw rugs in walkways, pet beds, electrical cords, etc? Yes  Adequate lighting in your home to reduce risk of falls? Yes    ASSISTIVE DEVICES UTILIZED TO PREVENT FALLS:  Life alert? No  Use of a cane, walker or w/c? No  Grab bars in the bathroom? No  Shower chair or bench in shower? No  Elevated toilet seat or a handicapped toilet? No   TIMED UP AND GO:  Was the test performed? No . Audio Visit   Cognitive Function:        11/12/2022   10:18 AM 11/08/2021    9:27 AM  6CIT Screen  What Year? 0 points 0 points  What month? 0 points 0 points  What time? 0 points 0 points  Count back from 20 0 points 0 points  Months in reverse 0 points 0 points  Repeat phrase 0 points 0 points  Total Score 0 points 0 points    Immunizations Immunization History  Administered Date(s)  Administered   Influenza Split 06/16/2012   Influenza,inj,Quad PF,6+ Mos 06/17/2013, 04/23/2014, 04/25/2015, 05/17/2017, 05/05/2019, 06/30/2021   PFIZER(Purple Top)SARS-COV-2 Vaccination 10/09/2019, 11/02/2019   Pneumococcal Polysaccharide-23 02/16/2015    TDAP status: Due, Education has been provided regarding the importance of this vaccine. Advised may receive this vaccine at local pharmacy or Health Dept. Aware to provide a copy of the vaccination record if obtained from local pharmacy or Health Dept. Verbalized acceptance and understanding.  Flu Vaccine status: Up to date    Covid-19 vaccine status: Completed vaccines  Qualifies for Shingles Vaccine? Yes   Zostavax completed No   Shingrix Completed?: No.    Education has been provided regarding the importance of this vaccine. Patient has been advised to call insurance company to determine out of pocket expense if they have not yet received this vaccine. Advised may also receive vaccine at local pharmacy or Health Dept. Verbalized acceptance and understanding.  Screening Tests Health Maintenance  Topic Date Due   DTaP/Tdap/Td (1 - Tdap) Never done   HEMOGLOBIN A1C  08/29/2022   OPHTHALMOLOGY EXAM  11/12/2022 (Originally 09/18/2022)   COVID-19 Vaccine (3 - Pfizer risk  series) 11/28/2022 (Originally 11/30/2019)   Zoster Vaccines- Shingrix (1 of 2) 02/11/2023 (Originally 09/26/1979)   FOOT EXAM  02/27/2023   INFLUENZA VACCINE  02/28/2023   Diabetic kidney evaluation - eGFR measurement  08/02/2023   Diabetic kidney evaluation - Urine ACR  08/02/2023   PAP SMEAR-Modifier  09/29/2023   Medicare Annual Wellness (AWV)  11/12/2023   MAMMOGRAM  05/29/2024   COLONOSCOPY (Pts 45-62yrs Insurance coverage will need to be confirmed)  01/01/2026   Hepatitis C Screening  Completed   HIV Screening  Completed   HPV VACCINES  Aged Out    Health Maintenance  Health Maintenance Due  Topic Date Due   DTaP/Tdap/Td (1 - Tdap) Never done   HEMOGLOBIN A1C  08/29/2022    Colorectal cancer screening: Type of screening: Colonoscopy. Completed 01/02/16. Repeat every 10 years  Mammogram status: Completed 05/19/22. Repeat every year    Lung Cancer Screening: (Low Dose CT Chest recommended if Age 37-80 years, 30 pack-year currently smoking OR have quit w/in 15years.) does not qualify.     Additional Screening:  Hepatitis C Screening: does qualify; Completed 03/29/17  Vision Screening: Recommended annual ophthalmology exams for early detection of glaucoma and other disorders of the eye. Is the patient up to date with their annual eye exam?  Yes  Who is the provider or what is the name of the office in which the patient attends annual eye exams? Dr Alben Spittle If pt is not established with a provider, would they like to be referred to a provider to establish care? No .   Dental Screening: Recommended annual dental exams for proper oral hygiene  Community Resource Referral / Chronic Care Management:  CRR required this visit?  No   CCM required this visit?  No      Plan:     I have personally reviewed and noted the following in the patient's chart:   Medical and social history Use of alcohol, tobacco or illicit drugs  Current medications and supplements including  opioid prescriptions. Patient is not currently taking opioid prescriptions. Functional ability and status Nutritional status Physical activity Advanced directives List of other physicians Hospitalizations, surgeries, and ER visits in previous 12 months Vitals Screenings to include cognitive, depression, and falls Referrals and appointments  In addition, I have reviewed and discussed with patient certain preventive protocols, quality  metrics, and best practice recommendations. A written personalized care plan for preventive services as well as general preventive health recommendations were provided to patient.     Tina Rung, LPN   11/05/8117   Nurse Notes: Patient due Hemoglobin A1C

## 2022-11-12 NOTE — Patient Instructions (Addendum)
Tina [name] , Thank you for taking time to come for your Medicare Wellness Visit. I appreciate your ongoing commitment to your health goals. Please review the following plan we discussed and let me know if I can assist you in the future.   These are the goals we discussed:  Goals       Stay healthy (pt-stated)      Weight (lb) < 200 lb (90.7 kg) (pt-stated)      Lose weight and be the best me I can be. Maintain good health.        This is a list of the screening recommended for you and due dates:  Health Maintenance  Topic Date Due   DTaP/Tdap/Td vaccine (1 - Tdap) Never done   Hemoglobin A1C  08/29/2022   Eye exam for diabetics  11/12/2022*   COVID-19 Vaccine (3 - Pfizer risk series) 11/28/2022*   Zoster (Shingles) Vaccine (1 of 2) 02/11/2023*   Complete foot exam   02/27/2023   Flu Shot  02/28/2023   Yearly kidney function blood test for diabetes  08/02/2023   Yearly kidney health urinalysis for diabetes  08/02/2023   Pap Smear  09/29/2023   Medicare Annual Wellness Visit  11/12/2023   Mammogram  05/29/2024   Colon Cancer Screening  01/01/2026   Hepatitis C Screening: USPSTF Recommendation to screen - Ages 18-79 yo.  Completed   HIV Screening  Completed   HPV Vaccine  Aged Out  *Topic was postponed. The date shown is not the original due date.    Advanced directives: Advance directive discussed with you today. Even though you declined this today, please call our office should you change your mind, and we can give you the proper paperwork for you to fill out.   Conditions/risks identified: None  Next appointment: Follow up in one year for your annual wellness visit.   Preventive Care 40-64 Years, Female Preventive care refers to lifestyle choices and visits with your health care provider that can promote health and wellness. What does preventive care include? A yearly physical exam. This is also called an annual well check. Dental exams once or twice a year. Routine  eye exams. Ask your health care provider how often you should have your eyes checked. Personal lifestyle choices, including: Daily care of your teeth and gums. Regular physical activity. Eating a healthy diet. Avoiding tobacco and drug use. Limiting alcohol use. Practicing safe sex. Taking low-dose aspirin daily starting at age 32. Taking vitamin and mineral supplements as recommended by your health care provider. What happens during an annual well check? The services and screenings done by your health care provider during your annual well check will depend on your age, overall health, lifestyle risk factors, and family history of disease. Counseling  Your health care provider may ask you questions about your: Alcohol use. Tobacco use. Drug use. Emotional well-being. Home and relationship well-being. Sexual activity. Eating habits. Work and work Astronomer. Method of birth control. Menstrual cycle. Pregnancy history. Screening  You may have the following tests or measurements: Height, weight, and BMI. Blood pressure. Lipid and cholesterol levels. These may be checked every 5 years, or more frequently if you are over 69 years old. Skin check. Lung cancer screening. You may have this screening every year starting at age 59 if you have a 30-pack-year history of smoking and currently smoke or have quit within the past 15 years. Fecal occult blood test (FOBT) of the stool. You may have this test every  year starting at age 96. Flexible sigmoidoscopy or colonoscopy. You may have a sigmoidoscopy every 5 years or a colonoscopy every 10 years starting at age 68. Hepatitis C blood test. Hepatitis B blood test. Sexually transmitted disease (STD) testing. Diabetes screening. This is done by checking your blood sugar (glucose) after you have not eaten for a while (fasting). You may have this done every 1-3 years. Mammogram. This may be done every 1-2 years. Talk to your health care provider  about when you should start having regular mammograms. This may depend on whether you have a family history of breast cancer. BRCA-related cancer screening. This may be done if you have a family history of breast, ovarian, tubal, or peritoneal cancers. Pelvic exam and Pap test. This may be done every 3 years starting at age 38. Starting at age 12, this may be done every 5 years if you have a Pap test in combination with an HPV test. Bone density scan. This is done to screen for osteoporosis. You may have this scan if you are at high risk for osteoporosis. Discuss your test results, treatment options, and if necessary, the need for more tests with your health care provider. Vaccines  Your health care provider may recommend certain vaccines, such as: Influenza vaccine. This is recommended every year. Tetanus, diphtheria, and acellular pertussis (Tdap, Td) vaccine. You may need a Td booster every 10 years. Zoster vaccine. You may need this after age 15. Pneumococcal 13-valent conjugate (PCV13) vaccine. You may need this if you have certain conditions and were not previously vaccinated. Pneumococcal polysaccharide (PPSV23) vaccine. You may need one or two doses if you smoke cigarettes or if you have certain conditions. Talk to your health care provider about which screenings and vaccines you need and how often you need them. This information is not intended to replace advice given to you by your health care provider. Make sure you discuss any questions you have with your health care provider. Document Released: 08/12/2015 Document Revised: 04/04/2016 Document Reviewed: 05/17/2015 Elsevier Interactive Patient Education  2017 [employer].    Fall Prevention in the Home Falls can cause injuries. They can happen to people of all ages. There are many things you can do to make your home safe and to help prevent falls. What can I do on the outside of my home? Regularly fix the edges of walkways and  driveways and fix any cracks. Remove anything that might make you trip as you walk through a door, such as a raised step or threshold. Trim any bushes or trees on the path to your home. Use bright outdoor lighting. Clear any walking paths of anything that might make someone trip, such as rocks or tools. Regularly check to see if handrails are loose or broken. Make sure that both sides of any steps have handrails. Any raised decks and porches should have guardrails on the edges. Have any leaves, snow, or ice cleared regularly. Use sand or salt on walking paths during winter. Clean up any spills in your garage right away. This includes oil or grease spills. What can I do in the bathroom? Use night lights. Install grab bars by the toilet and in the tub and shower. Do not use towel bars as grab bars. Use non-skid mats or decals in the tub or shower. If you need to sit down in the shower, use a plastic, non-slip stool. Keep the floor dry. Clean up any water that spills on the floor as soon as it  happens. Remove soap buildup in the tub or shower regularly. Attach bath mats securely with double-sided non-slip rug tape. Do not have throw rugs and other things on the floor that can make you trip. What can I do in the bedroom? Use night lights. Make sure that you have a light by your bed that is easy to reach. Do not use any sheets or blankets that are too big for your bed. They should not hang down onto the floor. Have a firm chair that has side arms. You can use this for support while you get dressed. Do not have throw rugs and other things on the floor that can make you trip. What can I do in the kitchen? Clean up any spills right away. Avoid walking on wet floors. Keep items that you use a lot in easy-to-reach places. If you need to reach something above you, use a strong step stool that has a grab bar. Keep electrical cords out of the way. Do not use floor polish or wax that makes floors  slippery. If you must use wax, use non-skid floor wax. Do not have throw rugs and other things on the floor that can make you trip. What can I do with my stairs? Do not leave any items on the stairs. Make sure that there are handrails on both sides of the stairs and use them. Fix handrails that are broken or loose. Make sure that handrails are as long as the stairways. Check any carpeting to make sure that it is firmly attached to the stairs. Fix any carpet that is loose or worn. Avoid having throw rugs at the top or bottom of the stairs. If you do have throw rugs, attach them to the floor with carpet tape. Make sure that you have a light switch at the top of the stairs and the bottom of the stairs. If you do not have them, ask someone to add them for you. What else can I do to help prevent falls? Wear shoes that: Do not have high heels. Have rubber bottoms. Are comfortable and fit you well. Are closed at the toe. Do not wear sandals. If you use a stepladder: Make sure that it is fully opened. Do not climb a closed stepladder. Make sure that both sides of the stepladder are locked into place. Ask someone to hold it for you, if possible. Clearly mark and make sure that you can see: Any grab bars or handrails. First and last steps. Where the edge of each step is. Use tools that help you move around (mobility aids) if they are needed. These include: Canes. Walkers. Scooters. Crutches. Turn on the lights when you go into a dark area. Replace any light bulbs as soon as they burn out. Set up your furniture so you have a clear path. Avoid moving your furniture around. If any of your floors are uneven, fix them. If there are any pets around you, be aware of where they are. Review your medicines with your doctor. Some medicines can make you feel dizzy. This can increase your chance of falling. Ask your doctor what other things that you can do to help prevent falls. This information is not  intended to replace advice given to you by your health care provider. Make sure you discuss any questions you have with your health care provider. Document Released: 05/12/2009 Document Revised: 12/22/2015 Document Reviewed: 08/20/2014 Elsevier Interactive Patient Education  2017 [employer].

## 2022-11-21 DIAGNOSIS — Z01419 Encounter for gynecological examination (general) (routine) without abnormal findings: Secondary | ICD-10-CM | POA: Diagnosis not present

## 2022-12-27 NOTE — Progress Notes (Deleted)
Office Visit Note  Patient: Tina [name]             Date of Birth: 1961-04-07           MRN: [id number]             PCP: [place], Betty G, MD Referring: [place], Betty G, MD Visit Date: 01/09/2023 Occupation: @GUAROCC @  Subjective:  No chief complaint on file.   History of Present Illness: Tina [name] is a 62 y.o. female ***     Activities of Daily Living:  Patient reports morning stiffness for *** {minute/hour:19697}.   Patient {ACTIONS;DENIES/REPORTS:21021675::"Denies"} nocturnal pain.  Difficulty dressing/grooming: {ACTIONS;DENIES/REPORTS:21021675::"Denies"} Difficulty climbing stairs: {ACTIONS;DENIES/REPORTS:21021675::"Denies"} Difficulty getting out of chair: {ACTIONS;DENIES/REPORTS:21021675::"Denies"} Difficulty using hands for taps, buttons, cutlery, and/or writing: {ACTIONS;DENIES/REPORTS:21021675::"Denies"}  No Rheumatology ROS completed.   PMFS History:  Patient Active Problem List   Diagnosis Date Noted   Controlled type 2 diabetes mellitus with complication, without long-term current use of insulin (HCC) 05/07/2019   Hyperlipidemia 05/05/2019   Obesity (BMI 30-39.9) 05/05/2019   Fibromyalgia 02/28/2016   Chronic low back pain 01/24/2016   Lupus (HCC) 04/19/2015   GERD (gastroesophageal reflux disease) 04/19/2015   Cervicogenic headache 02/10/2015   Neck pain 02/10/2015   Hot flashes 10/26/2014   GAD (generalized anxiety disorder) 10/26/2014   Peripheral neuropathy due to chemotherapy (HCC) 03/12/2014   Breast cancer of lower-outer quadrant of right female breast (HCC) 09/15/2013   Left shoulder pain - seeing Dr. Ophelia Charter 08/20/2013   Essential (primary) hypertension     Past Medical History:  Diagnosis Date   Anxiety    Breast cancer (HCC) 2015   Right Breast Cancer   Cancer of right breast (HCC) 09/03/13   Invasive Ductal Carcinoma/Ductal Carcinoma Insitu  biopsies   Chronic low back pain 01/24/2016   -and R hip pain -seen by rheumatologist and  ortho in the past -MRI with Dr. Ophelia Charter in 2015, R hip plain films in 2015 as well acupuncture, injections, tramadol not helpful trigger therapy [facility] and PT helpful    Degenerative joint disease    back neck and shoulder   Fibromyalgia    Dr. Corliss Skains   GERD (gastroesophageal reflux disease)    hx esophagitis   Hot flashes    Hypertension    Leukopenia 06/16/2013   Lupus [facility])    rheumatologist - Dr. Corliss Skains   Migraines    Neuromuscular disorder [facility])    neuropathy from chemo in feet/hands   Personal history of chemotherapy    2015   Personal history of radiation therapy 2015   Right Breast Cancer   S/P radiation therapy 04/27/2014-06/07/2014   1) Right breast, 50 Gy in 25 fractions/ 2) Right breast boost, 10 Gy in 5 fractions    Family History  Problem Relation Age of Onset   Hypertension Mother    Dementia Mother        small vessel disease   Cancer Mother 14       cervical  cancer   Cancer Father 75       prostate ca   Diabetes Father    Arthritis Father    Cancer Half-Sister    Breast cancer Neg Hx    Past Surgical History:  Procedure Laterality Date   BREAST BIOPSY Left 2015   benign core   BREAST BIOPSY Right 2015   malignant core    BREAST LUMPECTOMY Right 2015   BREAST SURGERY  03/01/2014   hx right breast cancer- 3 lymph  nodes removed and lumpectomy   CARPAL TUNNEL RELEASE  1998   rt   CESAREAN SECTION     x 3   PORTACATH PLACEMENT  08/2013   still currently active    ROBOTIC ASSISTED BILATERAL SALPINGO OOPHERECTOMY Bilateral 07/09/2014   Procedure: ROBOTIC ASSISTED BILATERAL SALPINGO OOPHORECTOM; :LYSIS OF ADHESIONS;  Surgeon: Serita Kyle, MD;  Location: [facility];  Service: Gynecology;  Laterality: Bilateral;   TRIGGER FINGER RELEASE  2004   rt thumb   trigger finger release left thumb  06/28/14   at [facility]   TUBAL LIGATION     WISDOM TOOTH EXTRACTION     Social History   Social History Narrative   Work  or School: stay at home      Home Situation: lives with husband and 44 yo son      Spiritual Beliefs: Christian      Lifestyle: elliptical 3-4 times per week; working on diet as well over last year in 2014            Immunization History  Administered Date(s) Administered   Influenza Split 06/16/2012   Influenza,inj,Quad PF,6+ Mos 06/17/2013, 04/23/2014, 04/25/2015, 05/17/2017, 05/05/2019, 06/30/2021   PFIZER(Purple Top)SARS-COV-2 Vaccination 10/09/2019, 11/02/2019   Pneumococcal Polysaccharide-23 02/16/2015     Objective: Vital Signs: LMP 09/28/2013 (Approximate)    Physical Exam   Musculoskeletal Exam: ***  CDAI Exam: CDAI Score: -- Patient Global: --; Provider Global: -- Swollen: --; Tender: -- Joint Exam 01/09/2023   No joint exam has been documented for this visit   There is currently no information documented on the homunculus. Go to the Rheumatology activity and complete the homunculus joint exam.  Investigation: No additional findings.  Imaging: No results found.  Recent Labs: Lab Results  Component Value Date   WBC 3.3 (L) 08/01/2022   HGB 13.7 08/01/2022   PLT 214 08/01/2022   NA 142 08/01/2022   K 3.7 08/01/2022   CL 103 08/01/2022   CO2 31 08/01/2022   GLUCOSE 72 08/01/2022   BUN 12 08/01/2022   CREATININE 0.98 08/01/2022   BILITOT 0.9 08/01/2022   ALKPHOS 70 05/01/2022   AST 22 08/01/2022   ALT 14 08/01/2022   PROT 7.9 08/01/2022   ALBUMIN 4.3 05/01/2022   CALCIUM 9.8 08/01/2022   GFRAA 64 11/03/2020    Speciality Comments: PLQ [facility] exam: [facility]. Follow up in 6 months.  Procedures:  No procedures performed Allergies: Adhesive [tape], Other, and Nickel   Assessment / Plan:     Visit Diagnoses: No diagnosis found.  Orders: No orders of the defined types were placed in this encounter.  No orders of the defined types were placed in this encounter.   Face-to-face time spent with patient was ***  minutes. Greater than 50% of time was spent in counseling and coordination of care.  Follow-Up Instructions: No follow-ups on file.   Ellen Henri, CMA  Note - This record has been created using Animal nutritionist.  Chart creation errors have been sought, but may not always  have been located. Such creation errors do not reflect on  the standard of medical care.

## 2023-01-09 ENCOUNTER — Ambulatory Visit: Payer: BC Managed Care – PPO | Admitting: Rheumatology

## 2023-01-09 DIAGNOSIS — M19041 Primary osteoarthritis, right hand: Secondary | ICD-10-CM

## 2023-01-09 DIAGNOSIS — M19072 Primary osteoarthritis, left ankle and foot: Secondary | ICD-10-CM

## 2023-01-09 DIAGNOSIS — M5030 Other cervical disc degeneration, unspecified cervical region: Secondary | ICD-10-CM

## 2023-01-09 DIAGNOSIS — M62838 Other muscle spasm: Secondary | ICD-10-CM

## 2023-01-09 DIAGNOSIS — M8580 Other specified disorders of bone density and structure, unspecified site: Secondary | ICD-10-CM

## 2023-01-09 DIAGNOSIS — Z79899 Other long term (current) drug therapy: Secondary | ICD-10-CM

## 2023-01-09 DIAGNOSIS — M160 Bilateral primary osteoarthritis of hip: Secondary | ICD-10-CM

## 2023-01-09 DIAGNOSIS — T451X5A Adverse effect of antineoplastic and immunosuppressive drugs, initial encounter: Secondary | ICD-10-CM

## 2023-01-09 DIAGNOSIS — Z170 Estrogen receptor positive status [ER+]: Secondary | ICD-10-CM

## 2023-01-09 DIAGNOSIS — G8929 Other chronic pain: Secondary | ICD-10-CM

## 2023-01-09 DIAGNOSIS — M797 Fibromyalgia: Secondary | ICD-10-CM

## 2023-01-09 DIAGNOSIS — M359 Systemic involvement of connective tissue, unspecified: Secondary | ICD-10-CM

## 2023-01-09 DIAGNOSIS — R768 Other specified abnormal immunological findings in serum: Secondary | ICD-10-CM

## 2023-01-09 DIAGNOSIS — I10 Essential (primary) hypertension: Secondary | ICD-10-CM

## 2023-06-19 NOTE — Telephone Encounter (Signed)
Telephone call  

## 2023-11-18 ENCOUNTER — Telehealth: Payer: Self-pay

## 2023-11-18 NOTE — Progress Notes (Unsigned)
 Subjective:   Tina [name] is a 63 y.o. who presents for a Medicare Wellness preventive visit.  Visit Complete: {VISITMETHODVS:[phone]}  {AWVVIDEO:32072}  Persons Participating in Visit: {Persons Participating in Visit:32444}  AWV Questionnaire: {AWVQuestionnaire:32338}        Objective:    There were no vitals filed for this visit. There is no height or weight on file to calculate BMI.     11/12/2022   10:18 AM 08/27/2022    3:54 PM 11/08/2021    9:27 AM 08/18/2021   10:55 AM 11/16/2020    9:21 AM 11/07/2020    3:14 PM 04/23/2017    4:29 PM  Advanced Directives  Does Patient Have a Medical Advance Directive? No No No No No No Yes  Would patient like information on creating a medical advance directive? No - Patient declined  No - Patient declined  No - Patient declined No - Patient declined     Current Medications (verified) Outpatient Encounter Medications as of 11/18/2023  Medication Sig   Accu-Chek Softclix Lancets lancets Use to check blood sugars 1-2 times daily.   albuterol  (VENTOLIN  HFA) 108 (90 Base) MCG/ACT inhaler Inhale 2 puffs into the lungs every 6 (six) hours as needed for wheezing or shortness of breath.   aspirin 81 MG tablet Take 81 mg by mouth daily.   b complex vitamins capsule Take 1 capsule by mouth daily.   BIOTIN 5000 PO Take 1 capsule by mouth. Takes sometimes   Blood Glucose Monitoring Suppl (ACCU-CHEK GUIDE) w/Device KIT Use to check blood sugars 1-2 times daily.   Calcium  Carbonate-Vit D-Min (CALTRATE 600+D PLUS MINERALS PO) Take 1 tablet by mouth daily.   Cholecalciferol (VITAMIN D3 PO) Take by mouth.   gabapentin  (NEURONTIN ) 600 MG tablet Take 1 tablet twice a day   glucose blood (ACCU-CHEK GUIDE) test strip Use to check blood sugars 1-2 times daily.   HEMP OIL-VANILLYL BUTYL ETHER EX Apply topically as needed.   HYDROcodone -acetaminophen  (NORCO/VICODIN) 5-325 MG tablet Take 1 tablet by mouth every 6 (six) hours as needed for moderate pain.    hydroxychloroquine  (PLAQUENIL ) 200 MG tablet Take 1 tablet (200 mg total) by mouth daily.   lisinopril -hydrochlorothiazide  (ZESTORETIC ) 20-12.5 MG tablet Take 0.5 tablets by mouth daily.   loratadine (CLARITIN) 10 MG tablet Take 10 mg by mouth as needed.   MAGNESIUM PO Take by mouth daily.   omeprazole  (PRILOSEC) 40 MG capsule TAKE 1 CAPSULE BY MOUTH ONCE DAILY BEFORE BREAKFAST   Propylene Glycol (SYSTANE COMPLETE OP) Apply to eye.   rosuvastatin  (CRESTOR ) 20 MG tablet Take 1 tablet (20 mg total) by mouth daily.   SUMAtriptan  (IMITREX ) 25 MG tablet Take 1 tablet (25 mg total) by mouth every 2 (two) hours as needed for migraine. May repeat in 2 hours if needed. Do not take more then 2 tablets in 24 hours.   TURMERIC PO Take by mouth daily.   UNABLE TO FIND Apply topically as needed. THC oil   No facility-administered encounter medications on file as of 11/18/2023.    Allergies (verified) Adhesive [tape], Other, and Nickel   History: Past Medical History:  Diagnosis Date   Anxiety    Breast cancer (HCC) 2015   Right Breast Cancer   Cancer of right breast (HCC) 09/03/13   Invasive Ductal Carcinoma/Ductal Carcinoma Insitu  biopsies   Chronic low back pain 01/24/2016   -and R hip pain -seen by rheumatologist and ortho in the past -MRI with Dr. Murrel Arnt in 2015, R hip  plain films in 2015 as well acupuncture, injections, tramadol  not helpful trigger therapy at [facility] and PT helpful    Degenerative joint disease    back neck and shoulder   Fibromyalgia    Dr. Alvira Josephs   GERD (gastroesophageal reflux disease)    hx esophagitis   Hot flashes    Hypertension    Leukopenia 06/16/2013   Lupus [facility])    rheumatologist - Dr. 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   Personal history of chemotherapy    2015   Personal history of radiation therapy 2015   Right Breast Cancer   S/P radiation therapy 04/27/2014-06/07/2014   1) Right  breast, 50 Gy in 25 fractions/ 2) Right breast boost, 10 Gy in 5 fractions   Past Surgical History:  Procedure Laterality Date   BREAST BIOPSY Left 2015   benign core   BREAST BIOPSY Right 2015   malignant core    BREAST LUMPECTOMY Right 2015   BREAST SURGERY  03/01/2014   hx right breast cancer- 3 lymph nodes removed and lumpectomy   CARPAL TUNNEL RELEASE  1998   rt   CESAREAN SECTION     x 3   PORTACATH PLACEMENT  08/2013   still currently active    ROBOTIC ASSISTED BILATERAL SALPINGO OOPHERECTOMY Bilateral 07/09/2014   Procedure: ROBOTIC ASSISTED BILATERAL SALPINGO OOPHORECTOM; :LYSIS OF ADHESIONS;  Surgeon: Kandra Orn, MD;  Location: [facility];  Service: Gynecology;  Laterality: Bilateral;   TRIGGER FINGER RELEASE  2004   rt thumb   trigger finger release left thumb  06/28/14   at orthopedic surgical center   TUBAL LIGATION     WISDOM TOOTH EXTRACTION     Family History  Problem Relation Age of Onset   Hypertension Mother    Dementia Mother        small vessel disease   Cancer Mother 45       cervical  cancer   Cancer Father 71       prostate ca   Diabetes Father    Arthritis Father    Cancer Half-Sister    Breast cancer Neg Hx    Social History   Socioeconomic History   Marital status: Married    Spouse name: Not on file   Number of children: 1   Years of education: Not on file   Highest education level: Not on file  Occupational History    Comment: homemaker  Tobacco Use   Smoking status: Former    Current packs/day: 0.00    Average packs/day: 1 pack/day for 15.0 years (15.0 ttl pk-yrs)    Types: Cigars, Cigarettes    Start date: 02/22/1989    Quit date: 02/23/2004    Years since quitting: 19.7    Passive exposure: Current (minimal)   Smokeless tobacco: Never   Tobacco comments:    remote smoking history  Vaping Use   Vaping status: Never Used  Substance and Sexual Activity   Alcohol use: Yes    Alcohol/week: 0.0 standard drinks of alcohol     Comment: rare   Drug use: Yes    Types: Marijuana    Comment: Marijuana as needed    Sexual activity: Yes  Other Topics Concern   Not on file  Social History Narrative   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      Home Situation: lives with husband and 29 yo son  Spiritual Beliefs: Christian      Lifestyle: elliptical 3-4 times per week; working on diet as well over last year in 2014            Social Drivers of Health   Financial Resource Strain: Low Risk  (11/12/2022)   Overall Financial Resource Strain (CARDIA)    Difficulty of Paying Living Expenses: Not hard at all  Food Insecurity: No Food Insecurity (11/12/2022)   Hunger Vital Sign    Worried About Running Out of Food in the Last Year: Never true    Ran Out of Food in the Last Year: Never true  Transportation Needs: No Transportation Needs (11/12/2022)   PRAPARE - Administrator, Civil Service (Medical): No    Lack of Transportation (Non-Medical): No  Physical Activity: Sufficiently Active (11/12/2022)   Exercise Vital Sign    Days of Exercise per Week: 3 days    Minutes of Exercise per Session: 60 min  Stress: No Stress Concern Present (11/12/2022)   [employer] of Occupational Health - Occupational Stress Questionnaire    Feeling of Stress : Not at all  Social Connections: Moderately Integrated (11/12/2022)   Social Connection and Isolation Panel [NHANES]    Frequency of Communication with Friends and Family: More than three times a week    Frequency of Social Gatherings with Friends and Family: More than three times a week    Attends Religious Services: Never    Database administrator or Organizations: Yes    Attends Engineer, structural: More than 4 times per year    Marital Status: Married    Tobacco Counseling Counseling given: Not Answered Tobacco comments: remote smoking history    Clinical Intake:              Lab Results  Component Value Date   HGBA1C 5.8 02/26/2022    HGBA1C 5.7 (A) 06/30/2021   HGBA1C 6.0 12/20/2020               Activities of Daily Living ***     No data to display          Patient Care Team: [place], Betty G, MD as PCP - General (Family Medicine) Murray Arnold, Shana Daring, MD as Consulting Physician (Emergency Medicine) Almeda Jacobs, MD as Consulting Physician (Hematology and Oncology) Heide Livings, MD as Consulting Physician (Obstetrics and Gynecology) Nicholas Bari, MD as Consulting Physician (Rheumatology) Jhonny Moss, MD as Consulting Physician (Neurology) Cameron Cea, MD as Consulting Physician (Hematology and Oncology) Karren Paddock, RN as [facility] Care Management Karren Paddock, RN as [facility] Care Management  Indicate any recent Medical Services you may have received from other than [facility] providers in the past year (date may be approximate).     Assessment:   This is a routine wellness examination for Tina [name].  Hearing/Vision screen No results found.   Goals Addressed   None    Depression Screen ***    11/12/2022   10:15 AM 04/11/2022   11:44 AM 02/26/2022   10:01 AM 11/08/2021    9:21 AM 10/20/2021   10:32 AM 06/30/2021    2:15 PM 11/07/2020    3:17 PM  PHQ 2/9 Scores  PHQ - 2 Score 0 0 0 0 0 0 2  PHQ- 9 Score      0 10    Fall Risk ***    11/12/2022   10:16 AM 08/27/2022    3:53 PM 02/26/2022  10:01 AM 11/08/2021    9:25 AM 08/18/2021   10:55 AM  Fall Risk   Falls in the past year? 0 0 0 0 0  Number falls in past yr: 0 0 0 0 0  Injury with Fall? 0 0 0 0 0  Risk for fall due to : No Fall Risks  No Fall Risks No Fall Risks   Follow up Falls prevention discussed Falls evaluation completed Falls evaluation completed      MEDICARE RISK AT HOME: ***    TIMED UP AND GO:  Was the test performed?  {AMBTIMEDUPGO:[phone]}  Cognitive Function: {CognitiveScreening:32337}        11/12/2022   10:18 AM 11/08/2021    9:27 AM  6CIT Screen  What Year? 0  points 0 points  What month? 0 points 0 points  What time? 0 points 0 points  Count back from 20 0 points 0 points  Months in reverse 0 points 0 points  Repeat phrase 0 points 0 points  Total Score 0 points 0 points    Immunizations Immunization History  Administered Date(s) Administered   Influenza Split 06/16/2012   Influenza,inj,Quad PF,6+ Mos 06/17/2013, 04/23/2014, 04/25/2015, 05/17/2017, 05/05/2019, 06/30/2021   PFIZER(Purple Top)SARS-COV-2 Vaccination 10/09/2019, 11/02/2019   Pneumococcal Polysaccharide-23 02/16/2015    Screening Tests Health Maintenance  Topic Date Due   DTaP/Tdap/Td (1 - Tdap) Never done   Zoster Vaccines- Shingrix (1 of 2) Never done   COVID-19 Vaccine (3 - Pfizer risk series) 11/30/2019   Cervical Cancer Screening (HPV/Pap Cotest)  09/28/2021   Diabetic kidney evaluation - Urine ACR  06/30/2022   HEMOGLOBIN A1C  08/29/2022   OPHTHALMOLOGY EXAM  09/18/2022   FOOT EXAM  02/27/2023   Diabetic kidney evaluation - eGFR measurement  08/02/2023   Medicare Annual Wellness (AWV)  11/12/2023   INFLUENZA VACCINE  02/28/2024   MAMMOGRAM  05/29/2024   Colonoscopy  01/01/2026   Hepatitis C Screening  Completed   HIV Screening  Completed   HPV VACCINES  Aged Out   Meningococcal B Vaccine  Aged Out   Pneumococcal Vaccine 32-7 Years old  Discontinued    Health Maintenance  Health Maintenance Due  Topic Date Due   DTaP/Tdap/Td (1 - Tdap) Never done   Zoster Vaccines- Shingrix (1 of 2) Never done   COVID-19 Vaccine (3 - Pfizer risk series) 11/30/2019   Cervical Cancer Screening (HPV/Pap Cotest)  09/28/2021   Diabetic kidney evaluation - Urine ACR  06/30/2022   HEMOGLOBIN A1C  08/29/2022   OPHTHALMOLOGY EXAM  09/18/2022   FOOT EXAM  02/27/2023   Diabetic kidney evaluation - eGFR measurement  08/02/2023   Medicare Annual Wellness (AWV)  11/12/2023   Health Maintenance Items Addressed: {HMMCR (Optional):30011}  Additional Screening:  Vision  Screening: Recommended annual ophthalmology exams for early detection of glaucoma and other disorders of the eye.  Dental Screening: Recommended annual dental exams for proper oral hygiene  Community Resource Referral / Chronic Care Management: CRR required this visit?  {YES/NO:21197}  CCM required this visit?  {CCM Required [phone]}     Plan:     I have personally reviewed and noted the following in the patient's chart:   Medical and social history Use of alcohol, tobacco or illicit drugs  Current medications and supplements including opioid prescriptions. {Opioid [phone]} Functional ability and status Nutritional status Physical activity Advanced directives List of other physicians Hospitalizations, surgeries, and ER visits in previous 12 months Vitals Screenings to include cognitive, depression, and falls  Referrals and appointments  In addition, I have reviewed and discussed with patient certain preventive protocols, quality metrics, and best practice recommendations. A written personalized care plan for preventive services as well as general preventive health recommendations were provided to patient.     Dewayne Ford, LPN   1/61/0960   After Visit Summary: {CHL AMB AWV After Visit [phone]}  Notes: {Nurse Notes:32343}

## 2023-11-18 NOTE — Telephone Encounter (Signed)
Contacted patient on preferred number listed in notes for scheduled AWV. Patient stated unable to complete visit today will call back to reschedule.
